# Patient Record
Sex: Male | Born: 1935 | Race: White | Hispanic: No | Marital: Married | Smoking: Never smoker
Health system: Southern US, Community
[De-identification: ages and names within clinical notes are randomized; demographics above are authoritative.]

## PROBLEM LIST (undated history)

## (undated) DIAGNOSIS — IMO0001 Reserved for inherently not codable concepts without codable children: Secondary | ICD-10-CM

## (undated) DIAGNOSIS — N183 Chronic kidney disease, stage 3 unspecified: Secondary | ICD-10-CM

## (undated) DIAGNOSIS — R001 Bradycardia, unspecified: Secondary | ICD-10-CM

## (undated) DIAGNOSIS — K219 Gastro-esophageal reflux disease without esophagitis: Secondary | ICD-10-CM

## (undated) DIAGNOSIS — E785 Hyperlipidemia, unspecified: Secondary | ICD-10-CM

## (undated) DIAGNOSIS — M5090 Cervical disc disorder, unspecified, unspecified cervical region: Secondary | ICD-10-CM

## (undated) DIAGNOSIS — E119 Type 2 diabetes mellitus without complications: Secondary | ICD-10-CM

## (undated) DIAGNOSIS — I480 Paroxysmal atrial fibrillation: Secondary | ICD-10-CM

## (undated) DIAGNOSIS — J849 Interstitial pulmonary disease, unspecified: Secondary | ICD-10-CM

## (undated) DIAGNOSIS — I2510 Atherosclerotic heart disease of native coronary artery without angina pectoris: Secondary | ICD-10-CM

## (undated) DIAGNOSIS — I10 Essential (primary) hypertension: Secondary | ICD-10-CM

## (undated) DIAGNOSIS — J189 Pneumonia, unspecified organism: Secondary | ICD-10-CM

## (undated) DIAGNOSIS — I5032 Chronic diastolic (congestive) heart failure: Secondary | ICD-10-CM

## (undated) HISTORY — DX: Chronic kidney disease, stage 3 (moderate): N18.3

## (undated) HISTORY — PX: POSTERIOR FUSION CERVICAL SPINE: SUR628

## (undated) HISTORY — PX: KNEE ARTHROSCOPY: SHX127

## (undated) HISTORY — DX: Paroxysmal atrial fibrillation: I48.0

## (undated) HISTORY — PX: CATARACT EXTRACTION W/ INTRAOCULAR LENS  IMPLANT, BILATERAL: SHX1307

## (undated) HISTORY — DX: Gastro-esophageal reflux disease without esophagitis: K21.9

## (undated) HISTORY — PX: JOINT REPLACEMENT: SHX530

## (undated) HISTORY — DX: Atherosclerotic heart disease of native coronary artery without angina pectoris: I25.10

## (undated) HISTORY — PX: ROTATOR CUFF REPAIR: SHX139

## (undated) HISTORY — DX: Chronic diastolic (congestive) heart failure: I50.32

## (undated) HISTORY — DX: Cervical disc disorder, unspecified, unspecified cervical region: M50.90

## (undated) HISTORY — DX: Chronic kidney disease, stage 3 unspecified: N18.30

## (undated) HISTORY — PX: KNEE ARTHROPLASTY: SHX992

## (undated) HISTORY — PX: CHOLECYSTECTOMY: SHX55

## (undated) HISTORY — DX: Hyperlipidemia, unspecified: E78.5

## (undated) HISTORY — DX: Bradycardia, unspecified: R00.1

## (undated) HISTORY — PX: CERVICAL DISC SURGERY: SHX588

## (undated) HISTORY — PX: EYE SURGERY: SHX253

---

## 1997-10-11 ENCOUNTER — Other Ambulatory Visit: Admission: RE | Admit: 1997-10-11 | Discharge: 1997-10-11 | Payer: Self-pay | Admitting: Internal Medicine

## 1997-11-22 ENCOUNTER — Other Ambulatory Visit: Admission: RE | Admit: 1997-11-22 | Discharge: 1997-11-22 | Payer: Self-pay | Admitting: Internal Medicine

## 1998-09-28 ENCOUNTER — Ambulatory Visit (HOSPITAL_BASED_OUTPATIENT_CLINIC_OR_DEPARTMENT_OTHER): Admission: RE | Admit: 1998-09-28 | Discharge: 1998-09-28 | Payer: Self-pay | Admitting: *Deleted

## 1999-10-07 ENCOUNTER — Emergency Department (HOSPITAL_COMMUNITY): Admission: EM | Admit: 1999-10-07 | Discharge: 1999-10-07 | Payer: Self-pay | Admitting: Emergency Medicine

## 1999-10-09 ENCOUNTER — Ambulatory Visit (HOSPITAL_COMMUNITY): Admission: RE | Admit: 1999-10-09 | Discharge: 1999-10-09 | Payer: Self-pay | Admitting: Gastroenterology

## 1999-10-09 ENCOUNTER — Encounter: Payer: Self-pay | Admitting: Gastroenterology

## 2000-01-10 ENCOUNTER — Encounter: Admission: RE | Admit: 2000-01-10 | Discharge: 2000-01-15 | Payer: Self-pay | Admitting: *Deleted

## 2000-09-06 ENCOUNTER — Emergency Department (HOSPITAL_COMMUNITY): Admission: EM | Admit: 2000-09-06 | Discharge: 2000-09-06 | Payer: Self-pay | Admitting: Emergency Medicine

## 2001-03-06 ENCOUNTER — Ambulatory Visit (HOSPITAL_COMMUNITY): Admission: RE | Admit: 2001-03-06 | Discharge: 2001-03-06 | Payer: Self-pay | Admitting: Gastroenterology

## 2003-07-28 ENCOUNTER — Emergency Department (HOSPITAL_COMMUNITY): Admission: EM | Admit: 2003-07-28 | Discharge: 2003-07-29 | Payer: Self-pay | Admitting: Emergency Medicine

## 2004-12-24 ENCOUNTER — Encounter: Admission: RE | Admit: 2004-12-24 | Discharge: 2004-12-24 | Payer: Self-pay | Admitting: Gastroenterology

## 2005-05-20 DIAGNOSIS — J189 Pneumonia, unspecified organism: Secondary | ICD-10-CM

## 2005-05-20 HISTORY — DX: Pneumonia, unspecified organism: J18.9

## 2005-06-06 ENCOUNTER — Inpatient Hospital Stay (HOSPITAL_COMMUNITY): Admission: EM | Admit: 2005-06-06 | Discharge: 2005-06-09 | Payer: Self-pay | Admitting: Emergency Medicine

## 2006-02-07 ENCOUNTER — Ambulatory Visit (HOSPITAL_COMMUNITY): Admission: RE | Admit: 2006-02-07 | Discharge: 2006-02-08 | Payer: Self-pay | Admitting: Orthopaedic Surgery

## 2006-03-22 ENCOUNTER — Emergency Department (HOSPITAL_COMMUNITY): Admission: EM | Admit: 2006-03-22 | Discharge: 2006-03-23 | Payer: Self-pay | Admitting: Emergency Medicine

## 2006-04-24 ENCOUNTER — Encounter: Admission: RE | Admit: 2006-04-24 | Discharge: 2006-05-16 | Payer: Self-pay | Admitting: Orthopaedic Surgery

## 2006-06-20 ENCOUNTER — Ambulatory Visit (HOSPITAL_COMMUNITY): Admission: RE | Admit: 2006-06-20 | Discharge: 2006-06-21 | Payer: Self-pay | Admitting: Orthopaedic Surgery

## 2011-07-11 DIAGNOSIS — Z961 Presence of intraocular lens: Secondary | ICD-10-CM | POA: Diagnosis not present

## 2011-07-11 DIAGNOSIS — E11311 Type 2 diabetes mellitus with unspecified diabetic retinopathy with macular edema: Secondary | ICD-10-CM | POA: Diagnosis not present

## 2011-07-11 DIAGNOSIS — E11359 Type 2 diabetes mellitus with proliferative diabetic retinopathy without macular edema: Secondary | ICD-10-CM | POA: Diagnosis not present

## 2012-01-14 DIAGNOSIS — N401 Enlarged prostate with lower urinary tract symptoms: Secondary | ICD-10-CM | POA: Diagnosis not present

## 2012-01-14 DIAGNOSIS — N138 Other obstructive and reflux uropathy: Secondary | ICD-10-CM | POA: Diagnosis not present

## 2012-01-24 DIAGNOSIS — I10 Essential (primary) hypertension: Secondary | ICD-10-CM | POA: Diagnosis not present

## 2012-01-24 DIAGNOSIS — E785 Hyperlipidemia, unspecified: Secondary | ICD-10-CM | POA: Diagnosis not present

## 2012-01-24 DIAGNOSIS — Z0000 Encounter for general adult medical examination without abnormal findings: Secondary | ICD-10-CM | POA: Diagnosis not present

## 2012-01-24 DIAGNOSIS — Z1331 Encounter for screening for depression: Secondary | ICD-10-CM | POA: Diagnosis not present

## 2012-04-22 DIAGNOSIS — Z23 Encounter for immunization: Secondary | ICD-10-CM | POA: Diagnosis not present

## 2012-05-14 DIAGNOSIS — E11359 Type 2 diabetes mellitus with proliferative diabetic retinopathy without macular edema: Secondary | ICD-10-CM | POA: Diagnosis not present

## 2012-05-14 DIAGNOSIS — E11311 Type 2 diabetes mellitus with unspecified diabetic retinopathy with macular edema: Secondary | ICD-10-CM | POA: Diagnosis not present

## 2012-05-14 DIAGNOSIS — Z961 Presence of intraocular lens: Secondary | ICD-10-CM | POA: Diagnosis not present

## 2013-01-14 DIAGNOSIS — N139 Obstructive and reflux uropathy, unspecified: Secondary | ICD-10-CM | POA: Diagnosis not present

## 2013-01-14 DIAGNOSIS — N401 Enlarged prostate with lower urinary tract symptoms: Secondary | ICD-10-CM | POA: Diagnosis not present

## 2013-02-05 DIAGNOSIS — N179 Acute kidney failure, unspecified: Secondary | ICD-10-CM | POA: Diagnosis not present

## 2013-02-05 DIAGNOSIS — N183 Chronic kidney disease, stage 3 unspecified: Secondary | ICD-10-CM | POA: Diagnosis not present

## 2013-02-05 DIAGNOSIS — Z1331 Encounter for screening for depression: Secondary | ICD-10-CM | POA: Diagnosis not present

## 2013-02-05 DIAGNOSIS — I10 Essential (primary) hypertension: Secondary | ICD-10-CM | POA: Diagnosis not present

## 2013-02-05 DIAGNOSIS — Z0000 Encounter for general adult medical examination without abnormal findings: Secondary | ICD-10-CM | POA: Diagnosis not present

## 2013-02-05 DIAGNOSIS — E785 Hyperlipidemia, unspecified: Secondary | ICD-10-CM | POA: Diagnosis not present

## 2013-02-05 DIAGNOSIS — E119 Type 2 diabetes mellitus without complications: Secondary | ICD-10-CM | POA: Diagnosis not present

## 2013-03-05 DIAGNOSIS — Z23 Encounter for immunization: Secondary | ICD-10-CM | POA: Diagnosis not present

## 2013-05-11 ENCOUNTER — Encounter (HOSPITAL_COMMUNITY): Payer: Self-pay | Admitting: Emergency Medicine

## 2013-05-11 ENCOUNTER — Inpatient Hospital Stay (HOSPITAL_COMMUNITY)
Admission: EM | Admit: 2013-05-11 | Discharge: 2013-05-17 | DRG: 194 | Disposition: A | Discharge status: Home / Self Care | Payer: BC Managed Care – PPO | Attending: Internal Medicine | Admitting: Internal Medicine

## 2013-05-11 ENCOUNTER — Emergency Department (HOSPITAL_COMMUNITY): Payer: BC Managed Care – PPO

## 2013-05-11 DIAGNOSIS — I10 Essential (primary) hypertension: Secondary | ICD-10-CM | POA: Diagnosis not present

## 2013-05-11 DIAGNOSIS — J9801 Acute bronchospasm: Secondary | ICD-10-CM | POA: Diagnosis not present

## 2013-05-11 DIAGNOSIS — R0602 Shortness of breath: Secondary | ICD-10-CM | POA: Diagnosis not present

## 2013-05-11 DIAGNOSIS — Z79899 Other long term (current) drug therapy: Secondary | ICD-10-CM

## 2013-05-11 DIAGNOSIS — J111 Influenza due to unidentified influenza virus with other respiratory manifestations: Secondary | ICD-10-CM | POA: Diagnosis not present

## 2013-05-11 DIAGNOSIS — Z96619 Presence of unspecified artificial shoulder joint: Secondary | ICD-10-CM

## 2013-05-11 DIAGNOSIS — E119 Type 2 diabetes mellitus without complications: Secondary | ICD-10-CM | POA: Diagnosis not present

## 2013-05-11 DIAGNOSIS — J09X2 Influenza due to identified novel influenza A virus with other respiratory manifestations: Principal | ICD-10-CM | POA: Diagnosis present

## 2013-05-11 DIAGNOSIS — B9789 Other viral agents as the cause of diseases classified elsewhere: Secondary | ICD-10-CM | POA: Diagnosis not present

## 2013-05-11 DIAGNOSIS — Z7982 Long term (current) use of aspirin: Secondary | ICD-10-CM | POA: Diagnosis not present

## 2013-05-11 DIAGNOSIS — Z96659 Presence of unspecified artificial knee joint: Secondary | ICD-10-CM | POA: Diagnosis not present

## 2013-05-11 DIAGNOSIS — J9819 Other pulmonary collapse: Secondary | ICD-10-CM | POA: Diagnosis not present

## 2013-05-11 DIAGNOSIS — R197 Diarrhea, unspecified: Secondary | ICD-10-CM | POA: Diagnosis present

## 2013-05-11 DIAGNOSIS — E860 Dehydration: Secondary | ICD-10-CM | POA: Diagnosis not present

## 2013-05-11 DIAGNOSIS — R6889 Other general symptoms and signs: Secondary | ICD-10-CM

## 2013-05-11 DIAGNOSIS — N179 Acute kidney failure, unspecified: Secondary | ICD-10-CM | POA: Diagnosis not present

## 2013-05-11 HISTORY — DX: Essential (primary) hypertension: I10

## 2013-05-11 HISTORY — DX: Type 2 diabetes mellitus without complications: E11.9

## 2013-05-11 HISTORY — DX: Pneumonia, unspecified organism: J18.9

## 2013-05-11 LAB — CBC WITH DIFFERENTIAL/PLATELET
Basophils Absolute: 0 10*3/uL (ref 0.0–0.1)
Basophils Relative: 0 % (ref 0–1)
HCT: 39.9 % (ref 39.0–52.0)
Hemoglobin: 13.1 g/dL (ref 13.0–17.0)
Lymphocytes Relative: 7 % — ABNORMAL LOW (ref 12–46)
MCH: 31.8 pg (ref 26.0–34.0)
MCHC: 32.8 g/dL (ref 30.0–36.0)
Monocytes Relative: 19 % — ABNORMAL HIGH (ref 3–12)
Neutrophils Relative %: 74 % (ref 43–77)
Platelets: 198 10*3/uL (ref 150–400)
RBC: 4.12 MIL/uL — ABNORMAL LOW (ref 4.22–5.81)
WBC: 9.2 10*3/uL (ref 4.0–10.5)

## 2013-05-11 LAB — COMPREHENSIVE METABOLIC PANEL
ALT: 31 U/L (ref 0–53)
Glucose, Bld: 177 mg/dL — ABNORMAL HIGH (ref 70–99)
Total Bilirubin: 0.2 mg/dL — ABNORMAL LOW (ref 0.3–1.2)
Total Protein: 6.4 g/dL (ref 6.0–8.3)

## 2013-05-11 LAB — CBC
MCH: 31.9 pg (ref 26.0–34.0)
MCHC: 32.6 g/dL (ref 30.0–36.0)
Platelets: 185 10*3/uL (ref 150–400)
RBC: 3.95 MIL/uL — ABNORMAL LOW (ref 4.22–5.81)
RDW: 13.9 % (ref 11.5–15.5)

## 2013-05-11 LAB — URINALYSIS, ROUTINE W REFLEX MICROSCOPIC
Glucose, UA: NEGATIVE mg/dL
Leukocytes, UA: NEGATIVE
Nitrite: NEGATIVE
Specific Gravity, Urine: 1.018 (ref 1.005–1.030)
Urobilinogen, UA: 0.2 mg/dL (ref 0.0–1.0)

## 2013-05-11 LAB — GLUCOSE, CAPILLARY
Glucose-Capillary: 86 mg/dL (ref 70–99)
Glucose-Capillary: 95 mg/dL (ref 70–99)
Glucose-Capillary: 140 mg/dL — ABNORMAL HIGH (ref 70–99)

## 2013-05-11 LAB — POCT I-STAT TROPONIN I: Troponin i, poc: 0 ng/mL (ref 0.00–0.08)

## 2013-05-11 LAB — CREATININE, SERUM
Creatinine, Ser: 2.21 mg/dL — ABNORMAL HIGH (ref 0.50–1.35)
GFR calc non Af Amer: 27 mL/min — ABNORMAL LOW (ref >90)

## 2013-05-11 LAB — INFLUENZA PANEL BY PCR (TYPE A & B)
H1N1 flu by pcr: NOT DETECTED
Influenza A By PCR: POSITIVE — AB

## 2013-05-11 LAB — CG4 I-STAT (LACTIC ACID): Lactic Acid, Venous: 2.39 mmol/L — ABNORMAL HIGH (ref 0.5–2.2)

## 2013-05-11 MED ORDER — ONDANSETRON HCL 4 MG PO TABS: 4.0000 mg | ORAL_TABLET | Freq: Four times a day (QID) | ORAL | Status: DC | PRN

## 2013-05-11 MED ORDER — PHENOL 1.4 % MT LIQD
1.0000 | OROMUCOSAL | Status: DC | PRN
  Administered 2013-05-11: 1 via OROMUCOSAL
  Filled 2013-05-11 (×2): qty 177

## 2013-05-11 MED ORDER — OSELTAMIVIR PHOSPHATE 75 MG PO CAPS
75.0000 mg | ORAL_CAPSULE | Freq: Two times a day (BID) | ORAL | Status: DC
  Administered 2013-05-11 – 2013-05-12 (×3): 75 mg via ORAL
  Filled 2013-05-11 (×4): qty 1

## 2013-05-11 MED ORDER — MENTHOL 3 MG MT LOZG
1.0000 | LOZENGE | OROMUCOSAL | Status: DC | PRN
  Administered 2013-05-11 – 2013-05-12 (×3): 3 mg via ORAL
  Filled 2013-05-11 (×2): qty 9

## 2013-05-11 MED ORDER — SODIUM CHLORIDE 0.9 % IV BOLUS (SEPSIS)
1000.0000 mL | Freq: Once | INTRAVENOUS | Status: AC
  Administered 2013-05-11: 1000 mL via INTRAVENOUS

## 2013-05-11 MED ORDER — ONDANSETRON HCL 4 MG/2ML IJ SOLN: 4.0000 mg | Freq: Four times a day (QID) | INTRAMUSCULAR | Status: DC | PRN

## 2013-05-11 MED ORDER — ACETAMINOPHEN 325 MG PO TABS
650.0000 mg | ORAL_TABLET | Freq: Four times a day (QID) | ORAL | Status: DC | PRN
  Administered 2013-05-13: 650 mg via ORAL
  Filled 2013-05-11: qty 2

## 2013-05-11 MED ORDER — ENOXAPARIN SODIUM 30 MG/0.3ML ~~LOC~~ SOLN
30.0000 mg | SUBCUTANEOUS | Status: DC
  Administered 2013-05-11: 30 mg via SUBCUTANEOUS
  Filled 2013-05-11 (×2): qty 0.3

## 2013-05-11 MED ORDER — ACETAMINOPHEN 650 MG RE SUPP: 650.0000 mg | Freq: Four times a day (QID) | RECTAL | Status: DC | PRN

## 2013-05-11 MED ORDER — SODIUM CHLORIDE 0.9 % IV SOLN: INTRAVENOUS | Status: DC

## 2013-05-11 MED ORDER — INSULIN ASPART 100 UNIT/ML ~~LOC~~ SOLN
0.0000 [IU] | Freq: Three times a day (TID) | SUBCUTANEOUS | Status: DC
  Administered 2013-05-12: 1 [IU] via SUBCUTANEOUS
  Administered 2013-05-12: 2 [IU] via SUBCUTANEOUS
  Administered 2013-05-13: 1 [IU] via SUBCUTANEOUS
  Administered 2013-05-13: 3 [IU] via SUBCUTANEOUS
  Administered 2013-05-14: 2 [IU] via SUBCUTANEOUS
  Administered 2013-05-15: 3 [IU] via SUBCUTANEOUS
  Administered 2013-05-16: 1 [IU] via SUBCUTANEOUS
  Administered 2013-05-16: 3 [IU] via SUBCUTANEOUS
  Administered 2013-05-16: 1 [IU] via SUBCUTANEOUS

## 2013-05-11 MED ORDER — INSULIN ASPART 100 UNIT/ML ~~LOC~~ SOLN
0.0000 [IU] | Freq: Every day | SUBCUTANEOUS | Status: DC
  Administered 2013-05-15 – 2013-05-16 (×2): 3 [IU] via SUBCUTANEOUS

## 2013-05-11 MED ORDER — SODIUM CHLORIDE 0.9 % IV SOLN
INTRAVENOUS | Status: DC
  Administered 2013-05-11 – 2013-05-13 (×4): via INTRAVENOUS

## 2013-05-11 NOTE — Progress Notes (Signed)
Got report from [employer].

## 2013-05-11 NOTE — H&P (Signed)
[facility] History and Physical  Justin Leach WJX:[id number] DOB: 1932/02/06 DOA: 05/11/2013  Referring physician: Dr. Pricilla Loveless PCP: Lillia Mountain, MD   Chief Complaint: Flu like symptoms.   History of Present Illness: Justin Leach is an 79 y.o. male with a PMH of DM and HTN who presents with a 4 day history of sore throat, fever, myalgias, dyspnea, cough productive of yellow mucous, but no chest pain.  Had flu vaccine this year.  Reports wife was recently sick with similar symptoms.  Saw PCP earlier today, and was sent to the ER for further evaluation.  Reports 2 months of diarrhea, no melena or hematochezia, not on any recent antibiotic therapy.  No aggravating or alleviating factors.  No significant PO intake for the past few days. Upon initial evaluation in the ER, patient was found to have an elevated creatinine. He was referred for further inpatient evaluation and treatment.  Review of Systems: Constitutional: + fever, no chills;  Appetite diminished; No weight loss, no weight gain, no fatigue.  HEENT: No blurry vision, no diplopia, + pharyngitis, no dysphagia CV: No chest pain, no palpitations, no PND.  Resp: + SOB, + cough, no pleuritic pain. GI: No nausea, no vomiting, but does report dry heaves, + diarrhea x 2 months, no melena, no hematochezia, no constipation.  GU: No dysuria, no hematuria, no frequency, no urgency. MSK: + myalgias, no arthralgias.  Neuro:  No headache, no focal neurological deficits, no history of seizures.  Psych: No depression, no anxiety.  Endo: No heat intolerance, no cold intolerance, no polyuria, no polydipsia  Skin: No rashes, no skin lesions.  Heme: No easy bruising.  Travel history: None in past 6 months.  Past Medical History Past Medical History  Diagnosis Date  . Diabetes mellitus without complication   . Hypertension      Past Surgical History Past Surgical History  Procedure Laterality Date  . Cervical surgeries    .  Cholecystectomy    . Joint replacement      bilateral knees, elbows, and shoulders  . Eye surgery       Social History: History   Social History  . Marital Status: Married    Spouse Name: Bonita Quin    Number of Children: 0  . Years of Education: N/A   Occupational History  . Retired from [employer] work.    Social History Main Topics  . Smoking status: Never Smoker   . Smokeless tobacco: Not on file  . Alcohol Use: No  . Drug Use: No  . Sexual Activity: Not on file   Other Topics Concern  . Not on file   Social History Narrative   Married.  Lives with wife.  Ambulates independently.    Family History:  Family History  Problem Relation Age of Onset  . Heart disease Neg Hx   . Cancer Neg Hx   . Diabetes Neg Hx     Allergies: Nifedipine  Meds: Prior to Admission medications   Not on File    Physical Exam: Filed Vitals:   05/11/13 1125 05/11/13 1148 05/11/13 1200 05/11/13 1215  BP:  109/37 105/44 118/60  Pulse:  61 58 60  Temp: 98.5 F (36.9 C) 98.6 F (37 C)    TempSrc: Oral Oral    Resp:  19 18 20   Weight:      SpO2:  98% 98% 96%     Physical Exam: Blood pressure 118/60, pulse 60, temperature 98.6 F (37 C), temperature source  Oral, resp. rate 20, weight 84.369 kg (186 lb), SpO2 96.00%. Gen: No acute distress. Head: Normocephalic, atraumatic. Eyes: PERRL, EOMI, sclerae nonicteric. Mouth: Oropharynx clear with postnasal drainage. Neck: Supple, no thyromegaly, no lymphadenopathy, no jugular venous distention. Chest: Lungs diminished, no wheezes, faint rhonchi. CV: Heart sounds are regular. No murmurs, rubs, or gallops. Abdomen: Soft, nontender, nondistended with normal active bowel sounds. Extremities: Extremities are without clubbing, edema, or cyanosis. Skin: Warm and dry. Neuro: Alert and oriented times 3; cranial nerves II through XII grossly intact. Psych: Mood and affect normal.  Labs on Admission:  Basic Metabolic Panel:  Recent  Labs Lab 05/11/13 0937  NA 139  K 4.1  CL 96  CO2 29  GLUCOSE 177*  BUN 42*  CREATININE 2.32*  CALCIUM 9.6   Liver Function Tests:  Recent Labs Lab 05/11/13 0937  AST 43*  ALT 31  ALKPHOS 69  BILITOT 0.2*  PROT 6.4  ALBUMIN 3.3*   CBC:  Recent Labs Lab 05/11/13 0937  WBC 9.2  NEUTROABS 6.7  HGB 13.1  HCT 39.9  MCV 96.8  PLT 198    CBG:  Recent Labs Lab 05/11/13 1157  GLUCAP 119*    Radiological Exams on Admission: Dg Chest 2 View  05/11/2013   CLINICAL DATA:  Shortness of breath, weakness  EXAM: CHEST  2 VIEW  COMPARISON:  None.  FINDINGS: Cardiomediastinal silhouette is stable. No acute infiltrate or pleural effusion. No pulmonary edema. Probable bilateral nodular nipple shadow. Repeat frontal view with nipple markers is recommended for confirmation. Osteopenia and mild degenerative changes thoracic spine. Stable mild compression deformity upper lumbar spine.  IMPRESSION: No acute infiltrate or pleural effusion. No pulmonary edema. Probable bilateral nodular nipple shadow. Repeat frontal view with nipple markers is recommended for confirmation. Osteopenia and mild degenerative changes thoracic spine.   Electronically Signed   By: Natasha Mead M.D.   On: 05/11/2013 10:20    EKG: Independently reviewed. Normal sinus rhythm at 68 beats per minute. LVH.  Assessment/Plan Principal Problem:   Acute renal failure secondary to dehydration / diarrhea Likely from a viral illness. Check GI pathogen panel and hydrate with normal saline 100 cc per hour. Recheck creatinine in the morning. Active Problems:   Diabetes We'll place on sliding scale insulin before every meal/at bedtime.   Hypertension Hold antihypertensives for now.   Influenza-like illness Check influenza panel and start empiric Tamiflu.  Code Status: Full. Family Communication: No family at bedside. Disposition Plan: Home when stable.  Time spent: 1 hour.  Aydia Maj [facility] Pager  [phone]  If 7PM-7AM, please contact night-coverage [email or web address] Password [facility] 05/11/2013, 12:41 PM

## 2013-05-11 NOTE — ED Provider Notes (Signed)
CSN: [id number]     Arrival date & time 05/11/13  0865 History   First MD Initiated Contact with Patient 05/11/13 0932     Chief Complaint  Patient presents with  . Diarrhea  . Shortness of Breath  . Emesis   (Consider location/radiation/quality/duration/timing/severity/associated sxs/prior Treatment) HPI Comments: 79 year old male sent in the ER by his primary care physician for fluids and lab work. He states that over the past several days he's been having trouble swallowing due to sore throat. Also been having a cough and shortness of breath with exertion. His been having diarrhea for 3-4 weeks. Denies any blood in his stools. He does also been having dry heaves and nausea. Denies any current nausea. No abdominal pain or chest pain. He states he lives by himself is concerned that if he would get sick he wouldn't be able to get help soon enough he kept getting worse. His wife, who lives in a separate house, has also been ill recently and fears that she transferred to him.   Past Medical History  Diagnosis Date  . Diabetes mellitus without complication   . Hypertension    Past Surgical History  Procedure Laterality Date  . Cervical surgeries    . Cholecystectomy    . Joint replacement      bilateral knees, elbows, and shoulders  . Eye surgery     No family history on file. History  Substance Use Topics  . Smoking status: Never Smoker   . Smokeless tobacco: Not on file  . Alcohol Use: No    Review of Systems  Constitutional: Positive for fever.  Respiratory: Positive for shortness of breath.   Gastrointestinal: Positive for nausea, vomiting and diarrhea. Negative for abdominal pain.  All other systems reviewed and are negative.    Allergies  Nifedipine  Home Medications  No current outpatient prescriptions on file. BP 119/44  Pulse 67  Temp(Src) 98.9 F (37.2 C) (Oral)  Resp 22  Wt 186 lb (84.369 kg)  SpO2 93% Physical Exam  Nursing note and vitals  reviewed. Constitutional: He is oriented to person, place, and time. He appears well-developed and well-nourished.  HENT:  Head: Normocephalic and atraumatic.  Right Ear: External ear normal.  Left Ear: External ear normal.  Nose: Nose normal.  Mouth/Throat: No oropharyngeal exudate.  Dry mucous membranes  Eyes: Right eye exhibits no discharge. Left eye exhibits no discharge.  Neck: Neck supple.  Cardiovascular: Normal rate, regular rhythm, normal heart sounds and intact distal pulses.   Pulmonary/Chest: Effort normal and breath sounds normal. He has no wheezes.  Abdominal: Soft. He exhibits no distension. There is no tenderness.  Musculoskeletal: He exhibits no edema.  Neurological: He is alert and oriented to person, place, and time.  Skin: Skin is warm and dry.    ED Course  Procedures (including critical care time) Labs Review Labs Reviewed  CBC WITH DIFFERENTIAL - Abnormal; Notable for the following:    RBC 4.12 (*)    Lymphocytes Relative 7 (*)    Monocytes Relative 19 (*)    Monocytes Absolute 1.7 (*)    All other components within normal limits  COMPREHENSIVE METABOLIC PANEL - Abnormal; Notable for the following:    Glucose, Bld 177 (*)    BUN 42 (*)    Creatinine, Ser 2.32 (*)    Albumin 3.3 (*)    AST 43 (*)    Total Bilirubin 0.2 (*)    GFR calc non Af Amer 25 (*)  GFR calc Af Amer 29 (*)    All other components within normal limits  GLUCOSE, CAPILLARY - Abnormal; Notable for the following:    Glucose-Capillary 119 (*)    All other components within normal limits  CG4 I-STAT (LACTIC ACID) - Abnormal; Notable for the following:    Lactic Acid, Venous 2.39 (*)    All other components within normal limits  URINALYSIS, ROUTINE W REFLEX MICROSCOPIC  INFLUENZA PANEL BY PCR  POCT I-STAT TROPONIN I   Imaging Review Dg Chest 2 View  05/11/2013   CLINICAL DATA:  Shortness of breath, weakness  EXAM: CHEST  2 VIEW  COMPARISON:  None.  FINDINGS: Cardiomediastinal  silhouette is stable. No acute infiltrate or pleural effusion. No pulmonary edema. Probable bilateral nodular nipple shadow. Repeat frontal view with nipple markers is recommended for confirmation. Osteopenia and mild degenerative changes thoracic spine. Stable mild compression deformity upper lumbar spine.  IMPRESSION: No acute infiltrate or pleural effusion. No pulmonary edema. Probable bilateral nodular nipple shadow. Repeat frontal view with nipple markers is recommended for confirmation. Osteopenia and mild degenerative changes thoracic spine.   Electronically Signed   By: Natasha Mead M.D.   On: 05/11/2013 10:20    EKG Interpretation   None       MDM   1. Acute renal failure   2. Flu-like symptoms    Patient has no pneumonia. Has acute renal insufficiency. There no baseline labs but per the patient's knowledge is no prior history of any renal problems. We'll treat with fluids and symptomatic care. We'll test for influenza.    Audree Camel, MD 05/11/13 [phone]

## 2013-05-11 NOTE — Progress Notes (Signed)
Admission note:   Arrival Method: Via stretcher from ED. Mental Status: A&Ox4 Telemetry: N/A  Skin: Intact.  Tubes: N/A IV: LAC NS@100ml /hr Pain: Denies.  Family: Pt is alone.  Living Situation: Home with spouse. Safety Measures: Call bell within reach. Bed alarm on.  6E Orientation: Oriented to unit and surroundings.   Kathlene November, [place] Smouse [place]

## 2013-05-11 NOTE — ED Notes (Signed)
Critical lactic acid lab value reported to dr. Criss Alvine.

## 2013-05-11 NOTE — Progress Notes (Signed)
Second attempt to get report.

## 2013-05-11 NOTE — ED Notes (Signed)
PT TO FLOOR

## 2013-05-11 NOTE — ED Notes (Signed)
Pt is diabetic and sent here for vomiting and diarrhea since Friday.  Pt reports short of breath with exertion and laying back.

## 2013-05-11 NOTE — ED Notes (Signed)
Checked patient cbg it was 56 notifed RN Brett Canales of blood sugar

## 2013-05-11 NOTE — ED Notes (Signed)
PT reports sore throat and temp 101 last nite

## 2013-05-11 NOTE — Progress Notes (Signed)
Attempted to get report from [place].

## 2013-05-12 DIAGNOSIS — J111 Influenza due to unidentified influenza virus with other respiratory manifestations: Secondary | ICD-10-CM | POA: Diagnosis present

## 2013-05-12 LAB — BASIC METABOLIC PANEL
BUN: 35 mg/dL — ABNORMAL HIGH (ref 6–23)
CO2: 29 mEq/L (ref 19–32)
Chloride: 102 mEq/L (ref 96–112)
Creatinine, Ser: 1.75 mg/dL — ABNORMAL HIGH (ref 0.50–1.35)
GFR calc Af Amer: 41 mL/min — ABNORMAL LOW (ref >90)
GFR calc non Af Amer: 35 mL/min — ABNORMAL LOW (ref >90)
Glucose, Bld: 106 mg/dL — ABNORMAL HIGH (ref 70–99)

## 2013-05-12 LAB — GLUCOSE, CAPILLARY: Glucose-Capillary: 134 mg/dL — ABNORMAL HIGH (ref 70–99)

## 2013-05-12 LAB — CLOSTRIDIUM DIFFICILE BY PCR: Toxigenic C. Difficile by PCR: NEGATIVE

## 2013-05-12 MED ORDER — ENOXAPARIN SODIUM 40 MG/0.4ML ~~LOC~~ SOLN
40.0000 mg | SUBCUTANEOUS | Status: DC
  Administered 2013-05-12 – 2013-05-16 (×5): 40 mg via SUBCUTANEOUS
  Filled 2013-05-12 (×6): qty 0.4

## 2013-05-12 MED ORDER — PANTOPRAZOLE SODIUM 40 MG PO TBEC
40.0000 mg | DELAYED_RELEASE_TABLET | Freq: Every day | ORAL | Status: DC
  Administered 2013-05-12 – 2013-05-17 (×6): 40 mg via ORAL
  Filled 2013-05-12 (×6): qty 1

## 2013-05-12 MED ORDER — OXYMETAZOLINE HCL 0.05 % NA SOLN
2.0000 | Freq: Two times a day (BID) | NASAL | Status: DC | PRN
  Filled 2013-05-12: qty 15

## 2013-05-12 MED ORDER — OSELTAMIVIR PHOSPHATE 30 MG PO CAPS
30.0000 mg | ORAL_CAPSULE | Freq: Two times a day (BID) | ORAL | Status: DC
  Administered 2013-05-12 – 2013-05-14 (×4): 30 mg via ORAL
  Filled 2013-05-12 (×5): qty 1

## 2013-05-12 MED ORDER — METOPROLOL TARTRATE 25 MG PO TABS
25.0000 mg | ORAL_TABLET | Freq: Two times a day (BID) | ORAL | Status: DC
  Administered 2013-05-12 – 2013-05-17 (×11): 25 mg via ORAL
  Filled 2013-05-12 (×12): qty 1

## 2013-05-12 MED ORDER — GUAIFENESIN-DM 100-10 MG/5ML PO SYRP
5.0000 mL | ORAL_SOLUTION | ORAL | Status: DC | PRN
  Administered 2013-05-13 – 2013-05-17 (×7): 5 mL via ORAL
  Filled 2013-05-12 (×8): qty 5

## 2013-05-12 MED ORDER — MENTHOL 3 MG MT LOZG: 1.0000 | LOZENGE | OROMUCOSAL | Status: DC | PRN

## 2013-05-12 NOTE — Progress Notes (Signed)
Subjective: Feels about the same, no diarrhea  Objective: Vital signs in last 24 hours: Temp:  [97.8 F (36.6 C)-99.2 F (37.3 C)] 99.2 F (37.3 C) (12/24 0437) Pulse Rate:  [58-67] 66 (12/24 0437) Resp:  [17-22] 17 (12/24 0437) BP: (105-149)/(37-70) 125/70 mmHg (12/24 0437) SpO2:  [93 %-98 %] 96 % (12/24 0437) Weight:  [84.369 kg (186 lb)] 84.369 kg (186 lb) (12/23 0926) Weight change:  Last BM Date: 05/11/13  Intake/Output from previous day: 12/23 0701 - 12/24 0700 In: 980 [P.O.:480; I.V.:500] Out: 250 [Urine:250] Intake/Output this shift:    General appearance: alert and cooperative Resp: clear to auscultation bilaterally Cardio: regular rate and rhythm, S1, S2 normal, no murmur, click, rub or gallop GI: soft, non-tender; bowel sounds normal; no masses,  no organomegaly Extremities: extremities normal, atraumatic, no cyanosis or edema  Lab Results:  Recent Labs  05/11/13 0937 05/11/13 1517  WBC 9.2 8.7  HGB 13.1 12.6*  HCT 39.9 38.7*  PLT 198 185   BMET  Recent Labs  05/11/13 0937 05/12/13 0412  NA 139 139  K 4.1 4.3  CL 96 102  CO2 29 29  GLUCOSE 177* 106*  BUN 42* 35*  CREATININE 2.32*  2.21* 1.75*  CALCIUM 9.6 8.3*    Studies/Results: Dg Chest 2 View  05/11/2013   CLINICAL DATA:  Shortness of breath, weakness  EXAM: CHEST  2 VIEW  COMPARISON:  None.  FINDINGS: Cardiomediastinal silhouette is stable. No acute infiltrate or pleural effusion. No pulmonary edema. Probable bilateral nodular nipple shadow. Repeat frontal view with nipple markers is recommended for confirmation. Osteopenia and mild degenerative changes thoracic spine. Stable mild compression deformity upper lumbar spine.  IMPRESSION: No acute infiltrate or pleural effusion. No pulmonary edema. Probable bilateral nodular nipple shadow. Repeat frontal view with nipple markers is recommended for confirmation. Osteopenia and mild degenerative changes thoracic spine.   Electronically Signed    By: Natasha Mead M.D.   On: 05/11/2013 10:20    Medications: I have reviewed the patient's current medications.  Assessment/Plan: Principal Problem:   Influenza A, on tamiflu (increase risk complication given age and diabetes), supportive care Active Problems:   Acute renal failure improving, continue IVFs, will check on baseline creatinine   Diarrhea apparently going on for weeks, check C diff, stool cultures, he has not had diarrhea since being in hospital, may need further outpt workup after discharge   Diabetes SSI levemir on hold   Hypertension ok lasix and ACEI on hold   LOS: 1 day   Justin Leach JOSEPH 05/12/2013, 7:24 AM

## 2013-05-12 NOTE — Evaluation (Signed)
Physical Therapy Evaluation Patient Details Name: Justin Leach MRN: [id number] DOB: 24-Aug-1931 Today's Date: 05/12/2013 Time: 4098-1191 PT Time Calculation (min): 14 min  PT Assessment / Plan / Recommendation History of Present Illness  Justin Leach is an 79 y.o. male with a PMH of DM and HTN who presents with a 4 day history of sore throat, fever, myalgias, dyspnea, cough productive of yellow mucous, but no chest pain.  Patient with influenze, dehydration, acute renal failure.  Clinical Impression  Patient presents with problems listed below.  Will benefit from acute PT to maximize independence prior to discharge home with wife.  Encouraged ambulation in hallway with nursing.    PT Assessment  Patient needs continued PT services    Follow Up Recommendations  No PT follow up;Supervision/Assistance - 24 hour    Does the patient have the potential to tolerate intense rehabilitation      Barriers to Discharge Decreased caregiver support Lives alone.  Wife to stay with patient at discharge.    Equipment Recommendations  None recommended by PT    Recommendations for Other Services     Frequency Min 3X/week    Precautions / Restrictions Precautions Precautions: None Restrictions Weight Bearing Restrictions: No   Pertinent Vitals/Pain       Mobility  Bed Mobility Bed Mobility: Supine to Sit;Sitting - Scoot to Edge of Bed Supine to Sit: 6: Modified independent (Device/Increase time);With rails Sitting - Scoot to Edge of Bed: 5: Supervision Details for Bed Mobility Assistance: No cues or assist needed.  Supervision for safety only Transfers Transfers: Sit to Stand;Stand to Sit Sit to Stand: 5: Supervision;With upper extremity assist;From bed Stand to Sit: 5: Supervision;With upper extremity assist;To bed Details for Transfer Assistance: Verbal cues for hand placement.  Supervision for safety/balance only.  Patient stood and took several steps in place.  Returned to  bed due to fatigue and dyspnea 3/4.   Ambulation/Gait Ambulation/Gait Assistance: Not tested (comment) (Patient declined ambulation.)    Exercises     PT Diagnosis: Difficulty walking;Generalized weakness  PT Problem List: Decreased strength;Decreased activity tolerance;Decreased mobility;Cardiopulmonary status limiting activity PT Treatment Interventions: Gait training;Stair training;Functional mobility training;Patient/family education     PT Goals(Current goals can be found in the care plan section) Acute Rehab PT Goals Patient Stated Goal: To feel better PT Goal Formulation: With patient Time For Goal Achievement: 05/19/13 Potential to Achieve Goals: Good  Visit Information  Last PT Received On: 05/12/13 Assistance Needed: +1 History of Present Illness: Justin Leach is an 79 y.o. male with a PMH of DM and HTN who presents with a 4 day history of sore throat, fever, myalgias, dyspnea, cough productive of yellow mucous, but no chest pain.  Patient with influenze, dehydration, acute renal failure.       Prior Functioning  Home Living Family/patient expects to be discharged to:: Private residence Living Arrangements: Alone (He and wife live in separate houses) Available Help at Discharge: Family;Available 24 hours/day (wife will stay with patient at discharge) Type of Home: House Home Access: Stairs to enter [employer] of Steps: 4 Entrance Stairs-Rails: Right;Left Home Layout: One level Home Equipment: None Prior Function Level of Independence: Independent Communication Communication: No difficulties    Cognition  Cognition Arousal/Alertness: Awake/alert Behavior During Therapy: WFL for tasks assessed/performed Overall Cognitive Status: Within Functional Limits for tasks assessed    Extremity/Trunk Assessment Upper Extremity Assessment Upper Extremity Assessment: Overall WFL for tasks assessed Lower Extremity Assessment Lower Extremity Assessment:  Overall WFL for  tasks assessed   Balance Balance Balance Assessed: Yes Static Sitting Balance Static Sitting - Balance Support: No upper extremity supported;Feet supported Static Sitting - Level of Assistance: 7: Independent Static Sitting - Comment/# of Minutes: 4 Static Standing Balance Static Standing - Balance Support: No upper extremity supported Static Standing - Level of Assistance: 5: Stand by assistance Static Standing - Comment/# of Minutes: 2  End of Session PT - End of Session Equipment Utilized During Treatment: Gait belt Activity Tolerance: Patient limited by fatigue Patient left: in bed;with call bell/phone within reach (sitting EOB to eat lunch) Nurse Communication: Mobility status  GP     Vena Austria 05/12/2013, 1:37 PM Durenda Hurt. Renaldo Fiddler, [facility] Acute Rehab Services Pager [phone]

## 2013-05-13 DIAGNOSIS — J111 Influenza due to unidentified influenza virus with other respiratory manifestations: Secondary | ICD-10-CM

## 2013-05-13 DIAGNOSIS — E860 Dehydration: Secondary | ICD-10-CM

## 2013-05-13 DIAGNOSIS — N179 Acute kidney failure, unspecified: Secondary | ICD-10-CM

## 2013-05-13 DIAGNOSIS — E119 Type 2 diabetes mellitus without complications: Secondary | ICD-10-CM

## 2013-05-13 LAB — BASIC METABOLIC PANEL
BUN: 29 mg/dL — ABNORMAL HIGH (ref 6–23)
CO2: 26 mEq/L (ref 19–32)
Glucose, Bld: 131 mg/dL — ABNORMAL HIGH (ref 70–99)
Potassium: 4.5 mEq/L (ref 3.5–5.1)
Sodium: 137 mEq/L (ref 135–145)

## 2013-05-13 LAB — GLUCOSE, CAPILLARY
Glucose-Capillary: 128 mg/dL — ABNORMAL HIGH (ref 70–99)
Glucose-Capillary: 206 mg/dL — ABNORMAL HIGH (ref 70–99)
Glucose-Capillary: 157 mg/dL — ABNORMAL HIGH (ref 70–99)

## 2013-05-13 MED ORDER — CLONIDINE HCL 0.1 MG PO TABS: 0.1000 mg | ORAL_TABLET | ORAL | Status: DC | PRN

## 2013-05-13 MED ORDER — ALBUTEROL SULFATE (5 MG/ML) 0.5% IN NEBU
2.5000 mg | INHALATION_SOLUTION | Freq: Four times a day (QID) | RESPIRATORY_TRACT | Status: DC
  Administered 2013-05-13 – 2013-05-17 (×16): 2.5 mg via RESPIRATORY_TRACT
  Filled 2013-05-13 (×18): qty 0.5

## 2013-05-13 NOTE — Progress Notes (Signed)
Subjective: [name] was admitted with respiratory infection - influenza. He c/o congestion and shortness of breath.  Objective: Lab:  Recent Labs  05/11/13 0937 05/11/13 1517  WBC 9.2 8.7  NEUTROABS 6.7  --   HGB 13.1 12.6*  HCT 39.9 38.7*  MCV 96.8 98.0  PLT 198 185    Recent Labs  05/11/13 0937 05/12/13 0412 05/13/13 0545  NA 139 139 137  K 4.1 4.3 4.5  CL 96 102 104  GLUCOSE 177* 106* 131*  BUN 42* 35* 29*  CREATININE 2.32*  2.21* 1.75* 1.52*  CALCIUM 9.6 8.3* 7.8*    Imaging:  Scheduled Meds: . enoxaparin (LOVENOX) injection  40 mg Subcutaneous Q24H  . insulin aspart  0-5 Units Subcutaneous QHS  . insulin aspart  0-9 Units Subcutaneous TID WC  . metoprolol tartrate  25 mg Oral BID  . oseltamivir  30 mg Oral BID  . pantoprazole  40 mg Oral Daily   Continuous Infusions: . sodium chloride 100 mL/hr at 05/13/13 0534   PRN Meds:.acetaminophen, acetaminophen, guaiFENesin-dextromethorphan, menthol-cetylpyridinium, ondansetron (ZOFRAN) IV, ondansetron, oxymetazoline, phenol   Physical Exam: Filed Vitals:   05/13/13 0500  BP: 146/47  Pulse: 58  Temp: 98.1 F (36.7 C)  Resp: 24   Gen'l - overweight man in no acute distress Cor - 2+ radial pulse, quiet precordium, RRR Pulm - no increased WOB, prolonged expiratory phase, expiratory wheezing throughout Abd- protruberant, BS+ Neuro - A&O x 3      Assessment/Plan: 1. ID/Pulm - influenza with respiratory symptoms. Plan Continue Tamiflu  HHN albuterol qid  2. GI - no report of diarrhea. C. Diff - negative  3. DM -  CBG (last 3)   Recent Labs  05/12/13 1639 05/12/13 2103 05/13/13 0735  GLUCAP 152* 151* 108*   Plan - continue ss  4. Acute renal insufficiency - Creatinine continues to come down.  Plan BMet in AM  5. HTN - stable   Illene Regulus [place] [facility] (o) [phone]; (c) [phone] Call-grp - [facility]  Tele: [phone]  05/13/2013, 9:18 AM

## 2013-05-13 NOTE — Progress Notes (Addendum)
Pt has elevated BP this pm of 173/47. Pt is asymptomatic, resting with eyes closed, and without pain or distress at this time. Norins MD paged. New orders placed. Will continue to monitor. Gilman Schmidt

## 2013-05-14 ENCOUNTER — Inpatient Hospital Stay (HOSPITAL_COMMUNITY): Payer: BC Managed Care – PPO

## 2013-05-14 LAB — GI PATHOGEN PANEL BY PCR, STOOL
Campylobacter by PCR: NEGATIVE
Cryptosporidium by PCR: NEGATIVE
E coli (ETEC) LT/ST: NEGATIVE
E coli (STEC): NEGATIVE
G lamblia by PCR: NEGATIVE
Norovirus GI/GII: NEGATIVE
Rotavirus A by PCR: NEGATIVE
Salmonella by PCR: NEGATIVE
Shigella by PCR: NEGATIVE

## 2013-05-14 LAB — BASIC METABOLIC PANEL
BUN: 22 mg/dL (ref 6–23)
CO2: 26 mEq/L (ref 19–32)
Calcium: 7.5 mg/dL — ABNORMAL LOW (ref 8.4–10.5)
Chloride: 107 mEq/L (ref 96–112)
Creatinine, Ser: 1.29 mg/dL (ref 0.50–1.35)

## 2013-05-14 LAB — GLUCOSE, CAPILLARY: Glucose-Capillary: 164 mg/dL — ABNORMAL HIGH (ref 70–99)

## 2013-05-14 MED ORDER — OSELTAMIVIR PHOSPHATE 75 MG PO CAPS
75.0000 mg | ORAL_CAPSULE | Freq: Every day | ORAL | Status: AC
  Administered 2013-05-15: 75 mg via ORAL
  Filled 2013-05-14: qty 1

## 2013-05-14 MED ORDER — HYDRALAZINE HCL 10 MG PO TABS
10.0000 mg | ORAL_TABLET | Freq: Two times a day (BID) | ORAL | Status: DC
  Administered 2013-05-14 – 2013-05-17 (×7): 10 mg via ORAL
  Filled 2013-05-14 (×9): qty 1

## 2013-05-14 MED ORDER — INSULIN DETEMIR 100 UNIT/ML ~~LOC~~ SOLN
20.0000 [IU] | Freq: Two times a day (BID) | SUBCUTANEOUS | Status: DC
  Administered 2013-05-14 – 2013-05-17 (×7): 20 [IU] via SUBCUTANEOUS
  Filled 2013-05-14 (×8): qty 0.2

## 2013-05-14 MED ORDER — OSELTAMIVIR PHOSPHATE 30 MG PO CAPS
30.0000 mg | ORAL_CAPSULE | Freq: Two times a day (BID) | ORAL | Status: AC
  Administered 2013-05-14: 30 mg via ORAL
  Filled 2013-05-14: qty 1

## 2013-05-14 MED ORDER — PREDNISONE 50 MG PO TABS
60.0000 mg | ORAL_TABLET | Freq: Every day | ORAL | Status: DC
  Administered 2013-05-15 – 2013-05-16 (×2): 60 mg via ORAL
  Filled 2013-05-14 (×3): qty 1

## 2013-05-14 MED ORDER — FUROSEMIDE 40 MG PO TABS
40.0000 mg | ORAL_TABLET | Freq: Every day | ORAL | Status: DC
  Administered 2013-05-14 – 2013-05-17 (×4): 40 mg via ORAL
  Filled 2013-05-14 (×5): qty 1

## 2013-05-14 NOTE — Progress Notes (Addendum)
Recheck BP was 148/52.

## 2013-05-14 NOTE — Progress Notes (Signed)
Subjective: Wheezing and chest congestion  Objective: Vital signs in last 24 hours: Temp:  [97.7 F (36.5 C)-98.8 F (37.1 C)] 98 F (36.7 C) (12/26 0540) Pulse Rate:  [57-75] 61 (12/26 0540) Resp:  [19-24] 19 (12/26 0540) BP: (132-173)/(46-59) 154/59 mmHg (12/26 0540) SpO2:  [94 %-98 %] 98 % (12/26 0540) Weight:  [88.814 kg (195 lb 12.8 oz)] 88.814 kg (195 lb 12.8 oz) (12/25 2203) Weight change:  Last BM Date: 05/13/13  Intake/Output from previous day: 12/25 0701 - 12/26 0700 In: 840 [P.O.:840] Out: 800 [Urine:800] Intake/Output this shift: Total I/O In: 240 [P.O.:240] Out: -   General appearance: alert and cooperative Resp: wheezes bilaterally Cardio: regular rate and rhythm, S1, S2 normal, no murmur, click, rub or gallop Extremities: edema 1+  Lab Results:  Recent Labs  05/11/13 0937 05/11/13 1517  WBC 9.2 8.7  HGB 13.1 12.6*  HCT 39.9 38.7*  PLT 198 185   BMET  Recent Labs  05/13/13 0545 05/14/13 0438  NA 137 140  K 4.5 4.1  CL 104 107  CO2 26 26  GLUCOSE 131* 153*  BUN 29* 22  CREATININE 1.52* 1.29  CALCIUM 7.8* 7.5*    Studies/Results: No results found.  Medications: I have reviewed the patient's current medications.  Assessment/Plan: Principal Problem:  Influenza A, on tamiflu (increase risk complication given age and diabetes), supportive care.  Has developed wheezing, check CXR and BNP to rule out cardiac, add diuretics vs po steroids depending on results  Active Problems:  Acute renal failure resolved, discontinue IVFs,  Diarrhea has not been a problem in hospital, c diff negative Diabetes SSI Restart levemir at half dose (home dose 40U twice a day) Hypertension restart ACEI at discharge, on beta blocker   LOS: 3 days   Justin Leach JOSEPH 05/14/2013, 7:00 AM

## 2013-05-14 NOTE — Progress Notes (Signed)
Physical Therapy Treatment Patient Details Name: Justin Leach MRN: [id number] DOB: [birth year] Today's Date: 05/14/2013 Time: 5621-3086 PT Time Calculation (min): 26 min  PT Assessment / Plan / Recommendation  History of Present Illness Justin Leach is an 79 y.o. male with a PMH of DM and HTN who presents with a 4 day history of sore throat, fever, myalgias, dyspnea, cough productive of yellow mucous, but no chest pain.  Patient with influenze, dehydration, acute renal failure.   PT Comments   Pt able to increase ambulation today but required supervision for safety. Pt was reaching for UE support at times but does not believe he needs an AD. Pt encouraged to amb 2-3 times per day to increase mobility and increase strength. Will cont to follow per POC.  Follow Up Recommendations  No PT follow up;Supervision/Assistance - 24 hour     Does the patient have the potential to tolerate intense rehabilitation     Barriers to Discharge        Equipment Recommendations  None recommended by PT    Recommendations for Other Services    Frequency Min 3X/week   Progress towards PT Goals Progress towards PT goals: Progressing toward goals  Plan Current plan remains appropriate    Precautions / Restrictions Precautions Precautions: None Restrictions Weight Bearing Restrictions: No   Pertinent Vitals/Pain No c/o pain.     Mobility  Bed Mobility Bed Mobility: Not assessed Details for Bed Mobility Assistance: pt sitting in bathroom; returned to chair  Transfers Transfers: Sit to Stand;Stand to Sit Sit to Stand: 5: Supervision;With upper extremity assist;From toilet Stand to Sit: 5: Supervision;To chair/3-in-1;With armrests;With upper extremity assist Details for Transfer Assistance: supervision for safety and cues for hand placement; pt relied heavily on hand rails for transfer from toilet to standing  Ambulation/Gait Ambulation/Gait Assistance: 5: Supervision Ambulation Distance  (Feet): 100 Feet Assistive device: None Ambulation/Gait Assistance Details: pt reaching for UE support at times to brace himself; supervision for safety; no LOB noted; pt increased fatigued with ambulating  Gait Pattern: Within Functional Limits Gait velocity: decreased  Stairs: No Wheelchair Mobility Wheelchair Mobility: No    Exercises General Exercises - Lower Extremity Ankle Circles/Pumps: AROM;Both;10 reps;Seated Long Arc Quad: AROM;Strengthening;Both;10 reps;Seated Hip Flexion/Marching: AROM;Both;10 reps;Seated;Strengthening   PT Diagnosis:    PT Problem List:   PT Treatment Interventions:     PT Goals (current goals can now be found in the care plan section) Acute Rehab PT Goals Patient Stated Goal: to not have diarrhea again  PT Goal Formulation: With patient Time For Goal Achievement: 05/19/13 Potential to Achieve Goals: Good  Visit Information  Last PT Received On: 05/14/13 Assistance Needed: +1 History of Present Illness: Justin Leach is an 79 y.o. male with a PMH of DM and HTN who presents with a 4 day history of sore throat, fever, myalgias, dyspnea, cough productive of yellow mucous, but no chest pain.  Patient with influenze, dehydration, acute renal failure.    Subjective Data  Subjective: pt sitting in bathroom; reported he had had diarrhea and had "messed up" his underwear. pt given towels and was SBA to complete ADLs Patient Stated Goal: to not have diarrhea again    Cognition  Cognition Arousal/Alertness: Awake/alert Behavior During Therapy: WFL for tasks assessed/performed Overall Cognitive Status: Within Functional Limits for tasks assessed    Balance  Balance Balance Assessed: Yes Static Standing Balance Static Standing - Balance Support: No upper extremity supported;During functional activity Static Standing - Level of Assistance:  5: Stand by assistance Static Standing - Comment/# of Minutes: tolerated standing at sink ~3 min  Dynamic  Standing Balance Dynamic Standing - Balance Support: No upper extremity supported;During functional activity Dynamic Standing - Level of Assistance: 5: Stand by assistance  End of Session PT - End of Session Equipment Utilized During Treatment: Gait belt Activity Tolerance: Patient tolerated treatment well Patient left: in chair;with call bell/phone within reach Nurse Communication: Mobility status   GP     Donell Sievert, [place] [phone] 05/14/2013, 1:02 PM

## 2013-05-14 NOTE — Progress Notes (Signed)
PHARMACIST - PHYSICIAN COMMUNICATION DR: Valentina Lucks CONCERNING:  Oseltamivir shortage  DESCRIPTION:  This patient has an order for Oseltamivir (Tamiflu) and has an Estimated Creatinine Clearance: 50.3 ml/min (by C-G formula based on Cr of 1.29)..  The package insert for Oseltamivir recommends 30mg  BID x 5 days for patients with CrCl 30-60 ml/min.  [facility] is experiencing a significant shortage of Oseltamivir 30mg  capsules.    To preserve an adequate supply of 30mg  capsules for our patients with more significant renal impairment the Infectious Diseases team has recommended to substitute Oseltamivir 75mg  qday x 5 days for patients with CrCl 30-60 ml/min.    RECOMMENDATION: Oseltamivir 75mg  PO qday to complete 5 days has been substituted for your patient. If you have any questions about this temporary substitution please feel free to call the Pharmacy at [phone] for assistance.  Estella Husk, Pharm.D., BCPS, AAHIVP Clinical Pharmacist Phone: [phone] or [phone] Pager: [phone] 05/14/2013, 3:07 PM

## 2013-05-15 DIAGNOSIS — J9801 Acute bronchospasm: Secondary | ICD-10-CM | POA: Diagnosis not present

## 2013-05-15 LAB — GLUCOSE, CAPILLARY
Glucose-Capillary: 70 mg/dL (ref 70–99)
Glucose-Capillary: 114 mg/dL — ABNORMAL HIGH (ref 70–99)
Glucose-Capillary: 211 mg/dL — ABNORMAL HIGH (ref 70–99)

## 2013-05-15 LAB — BASIC METABOLIC PANEL
BUN: 16 mg/dL (ref 6–23)
CO2: 24 mEq/L (ref 19–32)
Chloride: 104 mEq/L (ref 96–112)
GFR calc Af Amer: 60 mL/min — ABNORMAL LOW (ref >90)
GFR calc non Af Amer: 52 mL/min — ABNORMAL LOW (ref >90)
Potassium: 4.1 mEq/L (ref 3.5–5.1)
Sodium: 136 mEq/L (ref 135–145)

## 2013-05-15 NOTE — Progress Notes (Signed)
Assessment/Plan: Principal Problem:   Influenza with respiratory manifestations - he is better after he has started use of albuterol. Still with a significant cough and some lower chest pain from that.  Active Problems:   Acute renal failure   Diarrhea   Diabetes - control is adequate even with prednisone.    Hypertension   Bronchospasm - improved.   Subjective: Feels about the same except feels like his wheezing is a bit better.   Objective:  Vital Signs: Filed Vitals:   05/14/13 2224 05/14/13 2306 05/15/13 0523 05/15/13 0935  BP: 168/55  165/68 144/56  Pulse: 66  62 94  Temp:   98.9 F (37.2 C) 98.1 F (36.7 C)  TempSrc:   Oral Oral  Resp:   18 20  Height:      Weight:      SpO2:  96% 97% 98%     EXAM: no wheeze.    Intake/Output Summary (Last 24 hours) at 05/15/13 1038 Last data filed at 05/15/13 0900  Gross per 24 hour  Intake    960 ml  Output      0 ml  Net    960 ml    Lab Results:  Recent Labs  05/13/13 0545 05/14/13 0438  NA 137 140  K 4.5 4.1  CL 104 107  CO2 26 26  GLUCOSE 131* 153*  BUN 29* 22  CREATININE 1.52* 1.29  CALCIUM 7.8* 7.5*   No results found for this basename: AST, ALT, ALKPHOS, BILITOT, PROT, ALBUMIN,  in the last 72 hours No results found for this basename: LIPASE, AMYLASE,  in the last 72 hours No results found for this basename: WBC, NEUTROABS, HGB, HCT, MCV, PLT,  in the last 72 hours No results found for this basename: CKTOTAL, CKMB, CKMBINDEX, TROPONINI,  in the last 72 hours BNP    Component Value Date/Time   PROBNP 2240.0* 05/14/2013 0438   No results found for this basename: DDIMER,  in the last 72 hours No results found for this basename: HGBA1C,  in the last 72 hours No results found for this basename: CHOL, HDL, LDLCALC, TRIG, CHOLHDL, LDLDIRECT,  in the last 72 hours No results found for this basename: TSH, T4TOTAL, FREET3, T3FREE, THYROIDAB,  in the last 72 hours No results found for this basename: VITAMINB12,  FOLATE, FERRITIN, TIBC, IRON, RETICCTPCT,  in the last 72 hours  Studies/Results: Dg Chest 2 View  05/14/2013   CLINICAL DATA:  Wheezing.  Flu.  EXAM: CHEST  2 VIEW  COMPARISON:  05/11/2013.  FINDINGS: Mediastinum and hilar structures are normal. Poor inspiration with mild basilar atelectasis. Nodular density projected in each lung base,most likely represent nipple shadows. As recommended on prior study repeat frontal view with nipple markers is recommended. Heart size and pulmonary vascularity stable. No pleural effusion or pneumothorax. Surgical clips right upper quadrant.  IMPRESSION: 1. Mild basilar subsegmental atelectasis with poor inspiration. 2. As noted on prior study nodular density noted both lung bases, most likely nipple shadows. Repeat frontal view with nipple markers suggested.   Electronically Signed   By: Maisie Fus  Register   On: 05/14/2013 08:18   Medications: Medications administered in the last 24 hours reviewed.  Current Medication List reviewed.    LOS: 4 days   [facility] @ [facility] [phone]) 05/15/2013, 10:38 AM

## 2013-05-16 LAB — STOOL CULTURE

## 2013-05-16 LAB — GLUCOSE, CAPILLARY
Glucose-Capillary: 226 mg/dL — ABNORMAL HIGH (ref 70–99)
Glucose-Capillary: 121 mg/dL — ABNORMAL HIGH (ref 70–99)
Glucose-Capillary: 274 mg/dL — ABNORMAL HIGH (ref 70–99)

## 2013-05-16 MED ORDER — PREDNISONE 20 MG PO TABS
40.0000 mg | ORAL_TABLET | Freq: Every day | ORAL | Status: DC
  Administered 2013-05-17: 40 mg via ORAL
  Filled 2013-05-16 (×3): qty 2

## 2013-05-16 NOTE — Progress Notes (Signed)
Assessment/Plan: Principal Problem:   Influenza with respiratory manifestations - improving slowly. Should be ready for d/c in 1-2 days Active Problems:   Acute renal failure   Diarrhea   Diabetes   Hypertension - BP is up some. No changes in meds for now. He is on all of his home meds.    Bronchospasm - will lower prednisone dose (but already given today, so won't be decreased until tomorrow)   Subjective: Feels a little better. No chest pain, dyspnea. Still a lot of lower chest/upper abd pain with cough.   Objective:  Vital Signs: Filed Vitals:   05/15/13 1758 05/15/13 2056 05/15/13 2100 05/16/13 0703  BP: 177/60  170/62 180/53  Pulse: 70  72 58  Temp: 97.9 F (36.6 C)  98.1 F (36.7 C) 98.4 F (36.9 C)  TempSrc: Oral  Oral Oral  Resp: 20  20 20   Height:      Weight:      SpO2: 95% 97% 95% 97%     EXAM: LUNGS: clear.    Intake/Output Summary (Last 24 hours) at 05/16/13 0855 Last data filed at 05/15/13 1300  Gross per 24 hour  Intake    600 ml  Output      0 ml  Net    600 ml    Lab Results:  Recent Labs  05/14/13 0438 05/15/13 1130  NA 140 136  K 4.1 4.1  CL 107 104  CO2 26 24  GLUCOSE 153* 104*  BUN 22 16  CREATININE 1.29 1.27  CALCIUM 7.5* 8.1*   No results found for this basename: AST, ALT, ALKPHOS, BILITOT, PROT, ALBUMIN,  in the last 72 hours No results found for this basename: LIPASE, AMYLASE,  in the last 72 hours No results found for this basename: WBC, NEUTROABS, HGB, HCT, MCV, PLT,  in the last 72 hours No results found for this basename: CKTOTAL, CKMB, CKMBINDEX, TROPONINI,  in the last 72 hours BNP    Component Value Date/Time   PROBNP 2240.0* 05/14/2013 0438   No results found for this basename: DDIMER,  in the last 72 hours No results found for this basename: HGBA1C,  in the last 72 hours No results found for this basename: CHOL, HDL, LDLCALC, TRIG, CHOLHDL, LDLDIRECT,  in the last 72 hours No results found for this basename: TSH,  T4TOTAL, FREET3, T3FREE, THYROIDAB,  in the last 72 hours No results found for this basename: VITAMINB12, FOLATE, FERRITIN, TIBC, IRON, RETICCTPCT,  in the last 72 hours  Studies/Results: No results found. Medications: Medications administered in the last 24 hours reviewed.  Current Medication List reviewed.    LOS: 5 days   [facility] Internal Medicine @ [facility] [phone]) 05/16/2013, 8:55 AM

## 2013-05-17 MED ORDER — PREDNISONE (PAK) 10 MG PO TABS: ORAL_TABLET | Freq: Every day | ORAL | Status: DC

## 2013-05-17 MED ORDER — ALBUTEROL SULFATE (2.5 MG/3ML) 0.083% IN NEBU: 2.5000 mg | INHALATION_SOLUTION | Freq: Four times a day (QID) | RESPIRATORY_TRACT | Status: DC

## 2013-05-17 NOTE — Progress Notes (Signed)
Physical Therapy Treatment Patient Details Name: Justin Leach MRN: [id number] DOB: 1931/11/06 Today's Date: 05/17/2013 Time: 8295-6213 PT Time Calculation (min): 10 min  PT Assessment / Plan / Recommendation  History of Present Illness Justin Leach is an 79 y.o. male with a PMH of DM and HTN who presents with a 4 day history of sore throat, fever, myalgias, dyspnea, cough productive of yellow mucous, but no chest pain.  Patient with influenze, dehydration, acute renal failure.   PT Comments   Pt able to meet goals for mobility today. Pt at supervision to mod I level for all mobility. Able to ambulate steps with supervision for safety. Pt safe from mobility standpoint to D/C home today with wife.   Follow Up Recommendations  No PT follow up;Supervision/Assistance - 24 hour     Does the patient have the potential to tolerate intense rehabilitation     Barriers to Discharge        Equipment Recommendations  None recommended by PT    Recommendations for Other Services    Frequency Min 3X/week   Progress towards PT Goals Progress towards PT goals: Goals met/education completed, patient discharged from PT  Plan Current plan remains appropriate    Precautions / Restrictions Precautions Precautions: None Restrictions Weight Bearing Restrictions: No   Pertinent Vitals/Pain No complaints.     Mobility  Bed Mobility Bed Mobility: Not assessed Transfers Transfers: Sit to Stand;Stand to Sit Sit to Stand: 6: Modified independent (Device/Increase time);From bed Stand to Sit: 6: Modified independent (Device/Increase time);To bed Details for Transfer Assistance: pt demo good technique with transfers  Ambulation/Gait Ambulation/Gait Assistance: 5: Supervision Ambulation Distance (Feet): 150 Feet Assistive device: None Ambulation/Gait Assistance Details: supervision for safety; pt not reaching for UE support at this time; no LOB noted  Gait Pattern: Within Functional  Limits Gait velocity: decreased  Stairs: Yes Stairs Assistance: 5: Supervision Stairs Assistance Details (indicate cue type and reason): supervision for safety  Stair Management Technique: Two rails;Alternating pattern;Forwards Number of Stairs: 4         PT Diagnosis:    PT Problem List:   PT Treatment Interventions:     PT Goals (current goals can now be found in the care plan section) Acute Rehab PT Goals Patient Stated Goal: to go home today  PT Goal Formulation: With patient Time For Goal Achievement: 05/19/13 Potential to Achieve Goals: Good  Visit Information  Last PT Received On: 05/17/13 Assistance Needed: +1 History of Present Illness: Justin Leach is an 79 y.o. male with a PMH of DM and HTN who presents with a 4 day history of sore throat, fever, myalgias, dyspnea, cough productive of yellow mucous, but no chest pain.  Patient with influenze, dehydration, acute renal failure.    Subjective Data  Subjective: pt sitting on edge of bed; "im going home today. we can do what we need to do though"  Patient Stated Goal: to go home today    Cognition  Cognition Arousal/Alertness: Awake/alert Behavior During Therapy: WFL for tasks assessed/performed Overall Cognitive Status: Within Functional Limits for tasks assessed    Balance  Balance Balance Assessed: Yes Static Standing Balance Static Standing - Balance Support: No upper extremity supported;During functional activity Static Standing - Level of Assistance: 5: Stand by assistance  End of Session PT - End of Session Activity Tolerance: Patient tolerated treatment well Patient left: in bed;with call bell/phone within reach Nurse Communication: Mobility status   GP     Chad, [place],  PT [phone] 05/17/2013, 11:54 AM

## 2013-05-17 NOTE — Progress Notes (Signed)
Patient discharged.  Patient educated on discharge instructions, follow-up appointment, and discharge medications.  Prescription for prednisone sent electronically to [facility].  Patient educated on influenza, signs and symptoms, and when to call a doctor.  Patient verbalized understanding.  AVS signed.  Patient belongings gathered.  No IV to discontinue.  Patient escorted via wheelchair to ride with NT.

## 2013-05-17 NOTE — Discharge Summary (Signed)
Physician Discharge Summary  Patient ID: Justin Leach MRN: [id number] DOB/AGE: 1931-07-21 79 y.o.  Admit date: 05/11/2013 Discharge date: 05/17/2013  Admission Diagnoses:  Discharge Diagnoses:  Principal Problem:   Influenza with respiratory manifestations Active Problems:   Acute renal failure   Diarrhea   Diabetes   Hypertension   Bronchospasm   Discharged Condition: stable  Hospital Course:  Patient presented to the hospital with Flu-like symptoms, he was initially evaluated in his PCP office. Because of poor by mouth intake and concern for dehydration he was sent to the hospital.in the ED patient's labs revealed elevation in creatinine, chest x-ray without acute infiltrate. Because of his symptoms he was admitted, started empirically on treatment for influenza. Ultimately he was determined to be positive for influenza A, he was continued on Tamiflu, patient's azotemia responded well to IV fluids. Patient was seen by physical therapy, no discharge needs were required. Ultimately his home medications were resumed. At this time is felt to be stable for discharge. He has completed his course of Tamiflu in the hospital. Prednisone taper has been added because of bronchospasms he will continue to taper his steroids at home.  Consults:    Significant Diagnostic Studies:Dg Chest 2 View  05/14/2013   CLINICAL DATA:  Wheezing.  Flu.  EXAM: CHEST  2 VIEW  COMPARISON:  05/11/2013.  FINDINGS: Mediastinum and hilar structures are normal. Poor inspiration with mild basilar atelectasis. Nodular density projected in each lung base,most likely represent nipple shadows. As recommended on prior study repeat frontal view with nipple markers is recommended. Heart size and pulmonary vascularity stable. No pleural effusion or pneumothorax. Surgical clips right upper quadrant.  IMPRESSION: 1. Mild basilar subsegmental atelectasis with poor inspiration. 2. As noted on prior study nodular density noted  both lung bases, most likely nipple shadows. Repeat frontal view with nipple markers suggested.   Electronically Signed   By: Maisie Fus  Register   On: 05/14/2013 08:18   Dg Chest 2 View  05/11/2013   CLINICAL DATA:  Shortness of breath, weakness  EXAM: CHEST  2 VIEW  COMPARISON:  None.  FINDINGS: Cardiomediastinal silhouette is stable. No acute infiltrate or pleural effusion. No pulmonary edema. Probable bilateral nodular nipple shadow. Repeat frontal view with nipple markers is recommended for confirmation. Osteopenia and mild degenerative changes thoracic spine. Stable mild compression deformity upper lumbar spine.  IMPRESSION: No acute infiltrate or pleural effusion. No pulmonary edema. Probable bilateral nodular nipple shadow. Repeat frontal view with nipple markers is recommended for confirmation. Osteopenia and mild degenerative changes thoracic spine.   Electronically Signed   By: Natasha Mead M.D.   On: 05/11/2013 10:20      Discharge Exam: Blood pressure 164/69, pulse 59, temperature 97.7 F (36.5 C), temperature source Oral, resp. rate 20, height 5\' 8"  (1.727 m), weight 88.814 kg (195 lb 12.8 oz), SpO2 93.00%. General appearance: alert and cooperative Resp: moderate air movement bilateral without rales wheezes or rhonchi are Cardio: regular rate and rhythm, S1, S2 normal, no murmur, click, rub or gallop Extremities: extremities normal, atraumatic, no cyanosis or edema  Disposition:      Medication List         aspirin 325 MG tablet  Take 325 mg by mouth daily.     fosinopril 40 MG tablet  Commonly known as:  MONOPRIL  Take 40 mg by mouth daily.     furosemide 40 MG tablet  Commonly known as:  LASIX  Take 40 mg by mouth daily.  hydrALAZINE 10 MG tablet  Commonly known as:  APRESOLINE  Take 10 mg by mouth daily.     LEVEMIR 100 UNIT/ML injection  Generic drug:  insulin detemir     metoprolol tartrate 25 MG tablet  Commonly known as:  LOPRESSOR  Take 25 mg by mouth 2  (two) times daily.     multivitamin with minerals Tabs tablet  Take 1 tablet by mouth daily.     omeprazole 20 MG capsule  Commonly known as:  PRILOSEC  Take 20 mg by mouth daily.     predniSONE 10 MG tablet  Commonly known as:  STERAPRED UNI-PAK  Take by mouth daily. Take 2 tablets daily for 2 days then one tablet daily for 3 days then stop     simvastatin 80 MG tablet  Commonly known as:  ZOCOR  Take 40 mg by mouth at bedtime.     vitamin B-12 1000 MCG tablet  Commonly known as:  CYANOCOBALAMIN  Take 1,000 mcg by mouth daily.     Vitamin D-3 1000 UNITS Caps  Take 1 capsule by mouth daily.           Follow-up Information   Follow up with Lillia Mountain, MD. Call in 1 week.   Specialty:  Internal Medicine   Contact information:   [address] [place] [address] [phone]       Signed: Katy Apo 05/17/2013, 1:05 PM

## 2013-07-29 DIAGNOSIS — E11311 Type 2 diabetes mellitus with unspecified diabetic retinopathy with macular edema: Secondary | ICD-10-CM | POA: Diagnosis not present

## 2013-07-29 DIAGNOSIS — E11359 Type 2 diabetes mellitus with proliferative diabetic retinopathy without macular edema: Secondary | ICD-10-CM | POA: Diagnosis not present

## 2013-07-29 DIAGNOSIS — Z961 Presence of intraocular lens: Secondary | ICD-10-CM | POA: Diagnosis not present

## 2013-11-16 DIAGNOSIS — R3129 Other microscopic hematuria: Secondary | ICD-10-CM | POA: Diagnosis not present

## 2013-11-16 DIAGNOSIS — N400 Enlarged prostate without lower urinary tract symptoms: Secondary | ICD-10-CM | POA: Diagnosis not present

## 2014-02-15 DIAGNOSIS — E785 Hyperlipidemia, unspecified: Secondary | ICD-10-CM | POA: Diagnosis not present

## 2014-02-15 DIAGNOSIS — K219 Gastro-esophageal reflux disease without esophagitis: Secondary | ICD-10-CM | POA: Diagnosis not present

## 2014-02-15 DIAGNOSIS — I10 Essential (primary) hypertension: Secondary | ICD-10-CM | POA: Diagnosis not present

## 2014-02-15 DIAGNOSIS — E669 Obesity, unspecified: Secondary | ICD-10-CM | POA: Diagnosis not present

## 2014-02-15 DIAGNOSIS — H3500 Unspecified background retinopathy: Secondary | ICD-10-CM | POA: Diagnosis not present

## 2014-02-15 DIAGNOSIS — Z23 Encounter for immunization: Secondary | ICD-10-CM | POA: Diagnosis not present

## 2014-02-15 DIAGNOSIS — Z0000 Encounter for general adult medical examination without abnormal findings: Secondary | ICD-10-CM | POA: Diagnosis not present

## 2014-02-15 DIAGNOSIS — E1139 Type 2 diabetes mellitus with other diabetic ophthalmic complication: Secondary | ICD-10-CM | POA: Diagnosis not present

## 2014-02-15 DIAGNOSIS — N183 Chronic kidney disease, stage 3 unspecified: Secondary | ICD-10-CM | POA: Diagnosis not present

## 2014-03-01 ENCOUNTER — Other Ambulatory Visit: Payer: Self-pay | Admitting: Internal Medicine

## 2014-03-01 ENCOUNTER — Ambulatory Visit
Admission: RE | Admit: 2014-03-01 | Discharge: 2014-03-01 | Disposition: A | Discharge status: Home / Self Care | Payer: BC Managed Care – PPO | Source: Ambulatory Visit | Attending: Internal Medicine | Admitting: Internal Medicine

## 2014-03-01 DIAGNOSIS — R0609 Other forms of dyspnea: Secondary | ICD-10-CM | POA: Diagnosis not present

## 2014-03-01 DIAGNOSIS — R0600 Dyspnea, unspecified: Secondary | ICD-10-CM

## 2014-03-01 DIAGNOSIS — R5383 Other fatigue: Secondary | ICD-10-CM | POA: Diagnosis not present

## 2014-03-02 ENCOUNTER — Telehealth: Payer: Self-pay | Admitting: Cardiology

## 2014-03-02 NOTE — Telephone Encounter (Signed)
Received 10 pages of records from Dr Lavone Orn --[facility] at [facility] for New Patient appointment with Dr Percival Spanish on 03/29/14  Records given to [facility] in Medical Records for Dr Cherlyn Cushing schedule of 03/29/14.  lp

## 2014-03-25 ENCOUNTER — Encounter: Payer: Self-pay | Admitting: *Deleted

## 2014-03-29 ENCOUNTER — Ambulatory Visit (INDEPENDENT_AMBULATORY_CARE_PROVIDER_SITE_OTHER): Payer: BC Managed Care – PPO | Admitting: Cardiology

## 2014-03-29 ENCOUNTER — Encounter: Payer: Self-pay | Admitting: Cardiology

## 2014-03-29 VITALS — BP 144/58 | Ht 66.0 in | Wt 196.5 lb

## 2014-03-29 DIAGNOSIS — R001 Bradycardia, unspecified: Secondary | ICD-10-CM

## 2014-03-29 DIAGNOSIS — R0609 Other forms of dyspnea: Secondary | ICD-10-CM | POA: Diagnosis not present

## 2014-03-29 DIAGNOSIS — R0600 Dyspnea, unspecified: Secondary | ICD-10-CM

## 2014-03-29 DIAGNOSIS — R42 Dizziness and giddiness: Secondary | ICD-10-CM | POA: Diagnosis not present

## 2014-03-29 DIAGNOSIS — R0602 Shortness of breath: Secondary | ICD-10-CM | POA: Insufficient documentation

## 2014-03-29 NOTE — Progress Notes (Signed)
HPI The patient has no past cardiac history. He does have significant cardiovascular risk factors. He did apparently have a stress test in 2008. This was not the [place] and is reported to be normal. I don't have these results. For about 2 years he has had progressive fatigue. This has been worse and now is to the point where he can't do his usual walking. He might make it about 15 minutes before he will get fatigued and short of breath. He has to stop what he is doing. He does not get chest pressure, neck or arm discomfort as far as he can recall. He does not have resting shortness of breath, PND or orthopnea. However, for 30 years he has slept in a recliner. He does not have weight gain or edema.  Allergies  Allergen Reactions  . Nifedipine Other (See Comments)    Reaction: made gums swell up. Pt states that he had to have gum surgery after taking.     Current Outpatient Prescriptions  Medication Sig Dispense Refill  . aspirin 325 MG tablet Take 325 mg by mouth daily.    Marland Kitchen atorvastatin (LIPITOR) 80 MG tablet Take 40 mg by mouth daily.  2  . Cholecalciferol (VITAMIN D-3) 1000 UNITS CAPS Take 1 capsule by mouth daily.    . fosinopril (MONOPRIL) 40 MG tablet Take 40 mg by mouth daily.     . furosemide (LASIX) 40 MG tablet Take 40 mg by mouth daily.     . hydrALAZINE (APRESOLINE) 10 MG tablet Take 10 mg by mouth daily.     Marland Kitchen LEVEMIR 100 UNIT/ML injection     . metoprolol (LOPRESSOR) 50 MG tablet Take 50 mg by mouth 2 (two) times daily.    . Multiple Vitamin (MULTIVITAMIN WITH MINERALS) TABS tablet Take 1 tablet by mouth daily.    Marland Kitchen omeprazole (PRILOSEC) 20 MG capsule Take 20 mg by mouth daily.     . predniSONE (STERAPRED UNI-PAK) 10 MG tablet Take by mouth daily. Take 2 tablets daily for 2 days then one tablet daily for 3 days then stop 7 tablet 0  . vitamin B-12 (CYANOCOBALAMIN) 1000 MCG tablet Take 1,000 mcg by mouth daily.     No current facility-administered medications for this visit.     Past Medical History  Diagnosis Date  . Hypertension   . Type II diabetes mellitus   . Pneumonia 2007  . Cervical disc disease   . Dyslipidemia   . CKD (chronic kidney disease)     Past Surgical History  Procedure Laterality Date  . Eye surgery  1930's    "don't know what for" (05/11/2013)  . Cholecystectomy    . Cervical disc surgery      "i've had 3 neck ORs; not sure what kind; all thru the back of my neck"  . Posterior fusion cervical spine    . Knee arthroscopy      "had one scope; one open knee OR; not sure which on which side" (05/10/2013)  . Knee arthroplasty      "had one scope; one open knee OR; not sure which on which side" (05/10/2013)  . Rotator cuff repair Bilateral   . Joint replacement      bilateral knees, elbows, and shoulders (on 05/11/2013 pt denies all joint replacements"   . Cataract extraction w/ intraocular lens  implant, bilateral Bilateral     Family History  Problem Relation Age of Onset  . Heart disease Neg Hx  He does not know his father's history.   . Cancer Neg Hx   . Diabetes Neg Hx     History   Social History  . Marital Status: Married    Spouse Name: Vaughan Basta    Number of Children: 0  . Years of Education: N/A   Occupational History  . Retired from Architect work.    Social History Main Topics  . Smoking status: Never Smoker   . Smokeless tobacco: Never Used  . Alcohol Use: No  . Drug Use: No  . Sexual Activity: No   Other Topics Concern  . Not on file   Social History Narrative   Married.  Lives with wife.  Ambulates independently.  Has a dog names is named Programmer, applications  .    ROS:  PHYSICAL EXAM BP 144/58 mmHg  Ht 5\' 6"  (1.676 m)  Wt 196 lb 8 oz (89.132 kg)  BMI 31.73 kg/m2  GENERAL:  Well appearing HEENT:  Pupils equal round and reactive, fundi not visualized, oral mucosa unremarkable NECK:  No jugular venous distention, waveform within normal limits, carotid upstroke brisk and symmetric, no bruits, no  thyromegaly LYMPHATICS:  No cervical, inguinal adenopathy LUNGS:  Clear to auscultation bilaterally BACK:  No CVA tenderness CHEST:  Unremarkable HEART:  PMI not displaced or sustained,S1 and S2 within normal limits, no S3, no S4, no clicks, no rubs, no murmurs ABD:  Flat, positive bowel sounds normal in frequency in pitch, no bruits, no rebound, no guarding, no midline pulsatile mass, no hepatomegaly, no splenomegaly EXT:  2 plus pulses throughout, no edema, no cyanosis no clubbing SKIN:  No rashes no nodules NEURO:  Cranial nerves II through XII grossly intact, motor grossly intact throughout PSYCH:  Cognitively intact, oriented to person place and time  EKG:  Sinus rhythm, rate 61, axis within normal limits, first degree AV block, no acute ST-T wave changes. 03/29/2014  ASSESSMENT AND PLAN  DOE/FATIGUE:  With his long-standing diabetes this certainly could be an anginal. However, he would not be a walk on a treadmill. He needs screening stress testing. Therefore, I will order a Lexiscan Myoview.  I don't strongly suspect heart failure but I will check a BNP.  BRADYCARDIA:  Walking around the office today his heart rate did go up appropriately to 80 with minimal ambulation. I do not suspect chronotropic incompetence as the etiology of his complaints although I could consider a Holter if the above workup is negative and there is no other etiology.

## 2014-03-29 NOTE — Patient Instructions (Signed)
Schedule Lexiscan Myoview follow instructions given.    Lab work today ( bnp )

## 2014-03-30 DIAGNOSIS — M722 Plantar fascial fibromatosis: Secondary | ICD-10-CM | POA: Diagnosis not present

## 2014-03-31 ENCOUNTER — Telehealth (HOSPITAL_COMMUNITY): Payer: Self-pay

## 2014-03-31 NOTE — Telephone Encounter (Signed)
Encounter complete. 

## 2014-04-01 ENCOUNTER — Telehealth (HOSPITAL_COMMUNITY): Payer: Self-pay

## 2014-04-01 NOTE — Telephone Encounter (Signed)
Encounter complete. 

## 2014-04-05 ENCOUNTER — Ambulatory Visit (HOSPITAL_COMMUNITY)
Admission: RE | Admit: 2014-04-05 | Discharge: 2014-04-05 | Disposition: A | Discharge status: Home / Self Care | Payer: BC Managed Care – PPO | Source: Ambulatory Visit | Attending: Cardiovascular Disease | Admitting: Cardiovascular Disease

## 2014-04-05 DIAGNOSIS — E119 Type 2 diabetes mellitus without complications: Secondary | ICD-10-CM | POA: Diagnosis not present

## 2014-04-05 DIAGNOSIS — I10 Essential (primary) hypertension: Secondary | ICD-10-CM | POA: Diagnosis not present

## 2014-04-05 DIAGNOSIS — Z794 Long term (current) use of insulin: Secondary | ICD-10-CM | POA: Diagnosis not present

## 2014-04-05 DIAGNOSIS — E663 Overweight: Secondary | ICD-10-CM | POA: Insufficient documentation

## 2014-04-05 DIAGNOSIS — R9431 Abnormal electrocardiogram [ECG] [EKG]: Secondary | ICD-10-CM | POA: Insufficient documentation

## 2014-04-05 DIAGNOSIS — E785 Hyperlipidemia, unspecified: Secondary | ICD-10-CM | POA: Diagnosis not present

## 2014-04-05 DIAGNOSIS — R0609 Other forms of dyspnea: Secondary | ICD-10-CM

## 2014-04-05 DIAGNOSIS — R42 Dizziness and giddiness: Secondary | ICD-10-CM | POA: Diagnosis not present

## 2014-04-05 DIAGNOSIS — R001 Bradycardia, unspecified: Secondary | ICD-10-CM | POA: Diagnosis not present

## 2014-04-05 DIAGNOSIS — R0600 Dyspnea, unspecified: Secondary | ICD-10-CM | POA: Diagnosis not present

## 2014-04-05 MED ORDER — AMINOPHYLLINE 25 MG/ML IV SOLN
125.0000 mg | Freq: Once | INTRAVENOUS | Status: AC
  Administered 2014-04-05: 125 mg via INTRAVENOUS

## 2014-04-05 MED ORDER — TECHNETIUM TC 99M SESTAMIBI GENERIC - CARDIOLITE
30.2000 | Freq: Once | INTRAVENOUS | Status: AC | PRN
  Administered 2014-04-05: 30.2 via INTRAVENOUS

## 2014-04-05 MED ORDER — TECHNETIUM TC 99M SESTAMIBI GENERIC - CARDIOLITE
10.6000 | Freq: Once | INTRAVENOUS | Status: AC | PRN
  Administered 2014-04-05: 11 via INTRAVENOUS

## 2014-04-05 MED ORDER — REGADENOSON 0.4 MG/5ML IV SOLN
0.4000 mg | Freq: Once | INTRAVENOUS | Status: AC
  Administered 2014-04-05: 0.4 mg via INTRAVENOUS

## 2014-04-05 NOTE — Procedures (Addendum)
[facility] [address] [place] [address] [phone]  Cardiology Nuclear Med Study  Justin Leach is a 80 y.o. male     MRN : [id number]     DOB: 1932-04-07  Procedure Date: 04/05/2014  Nuclear Med Background Indication for Stress Test:  Evaluation for Ischemia and Abnormal EKG History:  No prior cardiac or respiratory history reported;Last NUC MPI in 2008-normal per dictation;CKD Cardiac Risk Factors: Hypertension, IDDM Type 2, Lipids and Overweight  Symptoms:  Dizziness, DOE, Fatigue and Light-Headedness   Nuclear Pre-Procedure Caffeine/Decaff Intake:  1:00am NPO After: 11am   IV Site: R Forearm  IV 0.9% NS with Angio Cath:  22g  Chest Size (in):  42"  IV Started by: Rolene Course, RN  Height: 5\' 6"  (1.676 m)  Cup Size: n/a  BMI:  Body mass index is 31.65 kg/(m^2). Weight:  196 lb (88.905 kg)   Tech Comments:  n/a    Nuclear Med Study 1 or 2 day study: 1 day  Stress Test Type:  [place] Provider:  Minus Breeding, MD   Resting Radionuclide: Technetium 67m Sestamibi  Resting Radionuclide Dose: 10.6 mCi   Stress Radionuclide:  Technetium 53m Sestamibi  Stress Radionuclide Dose: 30.2 mCi           Stress Protocol Rest HR: 54 Stress HR: 66  Rest BP: 151/65 Stress BP: 141/39  Exercise Time (min): n/a METS: n/a   Predicted Max HR: 140 bpm % Max HR: 53.57 bpm Rate Pressure Product: 11325  Dose of Adenosine (mg):  n/a Dose of Lexiscan: 0.4 mg  Dose of Atropine (mg): n/a Dose of Dobutamine: n/a mcg/kg/min (at max HR)  Stress Test Technologist: Leane Para, CCT Nuclear Technologist: Imagene Riches, CNMT   Rest Procedure:  Myocardial perfusion imaging was performed at rest 45 minutes following the intravenous administration of Technetium 68m Sestamibi. Stress Procedure:  The patient received IV Lexiscan 0.4 mg over 15-seconds.  Technetium 31m Sestamibi injected at 30-seconds.  Patient  experienced SOB and Dizziness and 125 mg Aminophylline IV was administered.  There were no significant changes with Lexiscan.  Quantitative spect images were obtained after a 45 minute delay.  Transient Ischemic Dilatation (Normal <1.22):  1.16  QGS EDV:  80 ml QGS ESV:  30 ml LV Ejection Fraction: 63%    Rest ECG: Sinus Bradycardia at 54  Stress ECG: No significant change from baseline ECG  QPS Raw Data Images:  Normal; no motion artifact; normal heart/lung ratio. Stress Images:  Normal homogeneous uptake in all areas of the myocardium. Rest Images:  Normal homogeneous uptake in all areas of the myocardium. Subtraction (SDS):  Normal  Impression Exercise Capacity:  Lexiscan with no exercise. BP Response:  Normal blood pressure response. Clinical Symptoms:  Mild shortness of breath and dizziness ECG Impression:  No significant ST segment change suggestive of ischemia. Comparison with Prior Nuclear Study: No significant change from previous study  Overall Impression:  Normal stress nuclear study.  LV Wall Motion:  NL LV Function, EF 63%; NL Wall Motion   [name] A, MD  04/06/2014 1:14 PM

## 2014-04-11 ENCOUNTER — Telehealth: Payer: Self-pay

## 2014-04-11 NOTE — Telephone Encounter (Signed)
PATIENT CAME IN TO CHECK TO SEE WHERE HIS ORTHOS ARE- HE STATES HE WAS SUPPOS ETO GET THEM WITHIN TWO WEEKS. PLEASE CALL PATIENT

## 2014-04-20 ENCOUNTER — Telehealth: Payer: Self-pay

## 2014-04-20 NOTE — Telephone Encounter (Signed)
Spoke with patient and informed him that his 2nd pair of orthotics were ordered through [employer] today and we would inform him when they arrive

## 2014-06-24 ENCOUNTER — Telehealth: Payer: Self-pay | Admitting: *Deleted

## 2014-06-24 NOTE — Telephone Encounter (Signed)
PATIENT PICKED UP ORTHOTICS ORDERED IN November 2015.  AFTER RESEARCHING ORTHOTICS WERE SUPPOSED TO HAVE BEEN BILLED TO [employer] IN November PATIENT PAID WHAT HE WAS QUOTED OF $35 AND INSURANCE WAS BILLED TO [employer] FOR THE ORIGINAL ORDER DATE

## 2014-08-08 DIAGNOSIS — R748 Abnormal levels of other serum enzymes: Secondary | ICD-10-CM | POA: Diagnosis not present

## 2014-08-11 ENCOUNTER — Other Ambulatory Visit: Payer: Self-pay | Admitting: Internal Medicine

## 2014-08-11 DIAGNOSIS — R748 Abnormal levels of other serum enzymes: Secondary | ICD-10-CM

## 2014-08-18 ENCOUNTER — Ambulatory Visit
Admission: RE | Admit: 2014-08-18 | Discharge: 2014-08-18 | Disposition: A | Discharge status: Home / Self Care | Payer: BLUE CROSS/BLUE SHIELD | Source: Ambulatory Visit | Attending: Internal Medicine | Admitting: Internal Medicine

## 2014-08-18 DIAGNOSIS — R748 Abnormal levels of other serum enzymes: Secondary | ICD-10-CM

## 2014-10-06 DIAGNOSIS — E11359 Type 2 diabetes mellitus with proliferative diabetic retinopathy without macular edema: Secondary | ICD-10-CM | POA: Diagnosis not present

## 2014-10-06 DIAGNOSIS — Z961 Presence of intraocular lens: Secondary | ICD-10-CM | POA: Diagnosis not present

## 2014-11-22 ENCOUNTER — Emergency Department (HOSPITAL_COMMUNITY)
Admission: EM | Admit: 2014-11-22 | Discharge: 2014-11-22 | Disposition: A | Discharge status: Home / Self Care | Payer: BLUE CROSS/BLUE SHIELD | Attending: Emergency Medicine | Admitting: Emergency Medicine

## 2014-11-22 ENCOUNTER — Emergency Department (HOSPITAL_COMMUNITY): Payer: BLUE CROSS/BLUE SHIELD

## 2014-11-22 ENCOUNTER — Encounter (HOSPITAL_COMMUNITY): Payer: Self-pay | Admitting: *Deleted

## 2014-11-22 DIAGNOSIS — S20211A Contusion of right front wall of thorax, initial encounter: Secondary | ICD-10-CM

## 2014-11-22 DIAGNOSIS — R109 Unspecified abdominal pain: Secondary | ICD-10-CM

## 2014-11-22 DIAGNOSIS — S79911A Unspecified injury of right hip, initial encounter: Secondary | ICD-10-CM | POA: Insufficient documentation

## 2014-11-22 DIAGNOSIS — W1839XA Other fall on same level, initial encounter: Secondary | ICD-10-CM | POA: Diagnosis not present

## 2014-11-22 DIAGNOSIS — S31609A Unspecified open wound of abdominal wall, unspecified quadrant with penetration into peritoneal cavity, initial encounter: Secondary | ICD-10-CM | POA: Diagnosis not present

## 2014-11-22 DIAGNOSIS — Z8701 Personal history of pneumonia (recurrent): Secondary | ICD-10-CM | POA: Insufficient documentation

## 2014-11-22 DIAGNOSIS — Y998 Other external cause status: Secondary | ICD-10-CM | POA: Diagnosis not present

## 2014-11-22 DIAGNOSIS — N189 Chronic kidney disease, unspecified: Secondary | ICD-10-CM | POA: Diagnosis not present

## 2014-11-22 DIAGNOSIS — Y9389 Activity, other specified: Secondary | ICD-10-CM | POA: Insufficient documentation

## 2014-11-22 DIAGNOSIS — Y92009 Unspecified place in unspecified non-institutional (private) residence as the place of occurrence of the external cause: Secondary | ICD-10-CM | POA: Diagnosis not present

## 2014-11-22 DIAGNOSIS — Z79899 Other long term (current) drug therapy: Secondary | ICD-10-CM | POA: Insufficient documentation

## 2014-11-22 DIAGNOSIS — S3991XA Unspecified injury of abdomen, initial encounter: Secondary | ICD-10-CM | POA: Diagnosis not present

## 2014-11-22 DIAGNOSIS — E119 Type 2 diabetes mellitus without complications: Secondary | ICD-10-CM | POA: Insufficient documentation

## 2014-11-22 DIAGNOSIS — Z7982 Long term (current) use of aspirin: Secondary | ICD-10-CM | POA: Diagnosis not present

## 2014-11-22 DIAGNOSIS — R1011 Right upper quadrant pain: Secondary | ICD-10-CM | POA: Diagnosis not present

## 2014-11-22 DIAGNOSIS — S29001A Unspecified injury of muscle and tendon of front wall of thorax, initial encounter: Secondary | ICD-10-CM | POA: Diagnosis present

## 2014-11-22 DIAGNOSIS — E785 Hyperlipidemia, unspecified: Secondary | ICD-10-CM | POA: Insufficient documentation

## 2014-11-22 DIAGNOSIS — I129 Hypertensive chronic kidney disease with stage 1 through stage 4 chronic kidney disease, or unspecified chronic kidney disease: Secondary | ICD-10-CM | POA: Diagnosis not present

## 2014-11-22 DIAGNOSIS — W19XXXA Unspecified fall, initial encounter: Secondary | ICD-10-CM

## 2014-11-22 LAB — CBG MONITORING, ED: GLUCOSE-CAPILLARY: 89 mg/dL (ref 65–99)

## 2014-11-22 MED ORDER — OXYCODONE HCL 5 MG PO TABS: 5.0000 mg | ORAL_TABLET | Freq: Four times a day (QID) | ORAL | Status: DC | PRN

## 2014-11-22 MED ORDER — HYDROCODONE-ACETAMINOPHEN 5-325 MG PO TABS
1.0000 | ORAL_TABLET | Freq: Once | ORAL | Status: AC
  Administered 2014-11-22: 1 via ORAL
  Filled 2014-11-22: qty 1

## 2014-11-22 NOTE — ED Notes (Signed)
Pt presents via [facility] for right hip pain.  Pt reports getting his feet tangled 3 days ago and falling onto right hip, denies LOC.   No deformity or crepitus noted to hip, bruising noted.  Hip tender to palpation and with movement.  BP-160/90 P-68 R-18, pt a x 4, NAD.

## 2014-11-22 NOTE — ED Provider Notes (Signed)
CSN: [id number]     Arrival date & time 11/22/14  1437 History   First MD Initiated Contact with Patient 11/22/14 1501     Chief Complaint  Patient presents with  . Hip Pain   (Consider location/radiation/quality/duration/timing/severity/associated sxs/prior Treatment) Patient is a 81 y.o. male presenting with fall. The history is provided by the patient.  Fall This is a new problem. Episode onset: 2 days ago. The problem occurs constantly. The problem has been unchanged. Associated symptoms include abdominal pain (RUQ) and chest pain (Right lower lateral ribs). Pertinent negatives include no anorexia, change in bowel habit, congestion, coughing, diaphoresis, fatigue, fever, headaches, joint swelling, myalgias, nausea, rash, urinary symptoms, vomiting or weakness. The symptoms are aggravated by bending, coughing, standing, stress and walking. He has tried nothing for the symptoms.    Past Medical History  Diagnosis Date  . Hypertension   . Type II diabetes mellitus   . Pneumonia 2007  . Cervical disc disease   . Dyslipidemia   . CKD (chronic kidney disease)    Past Surgical History  Procedure Laterality Date  . Eye surgery  1930's    "don't know what for" (05/11/2013)  . Cholecystectomy    . Cervical disc surgery      "i've had 3 neck ORs; not sure what kind; all thru the back of my neck"  . Posterior fusion cervical spine    . Knee arthroscopy      "had one scope; one open knee OR; not sure which on which side" (05/10/2013)  . Knee arthroplasty      "had one scope; one open knee OR; not sure which on which side" (05/10/2013)  . Rotator cuff repair Bilateral   . Joint replacement      bilateral knees, elbows, and shoulders (on 05/11/2013 pt denies all joint replacements"   . Cataract extraction w/ intraocular lens  implant, bilateral Bilateral    Family History  Problem Relation Age of Onset  . Heart disease Neg Hx     He does not know his father's history.   . Cancer Neg Hx    . Diabetes Neg Hx    History  Substance Use Topics  . Smoking status: Never Smoker   . Smokeless tobacco: Never Used  . Alcohol Use: No    Review of Systems  Constitutional: Negative for fever, diaphoresis and fatigue.  HENT: Negative for congestion.   Respiratory: Negative for cough, chest tightness and shortness of breath.   Cardiovascular: Positive for chest pain (Right lower lateral ribs).  Gastrointestinal: Positive for abdominal pain (RUQ). Negative for nausea, vomiting, anorexia and change in bowel habit.  Musculoskeletal: Positive for gait problem (2/2 right chest wall pain). Negative for myalgias and joint swelling.  Skin: Negative for rash.  Neurological: Negative for weakness, light-headedness and headaches.  Psychiatric/Behavioral: Negative for confusion.  All other systems reviewed and are negative.     Allergies  Nifedipine  Home Medications   Prior to Admission medications   Medication Sig Start Date End Date Taking? Authorizing Provider  aspirin 325 MG tablet Take 325 mg by mouth daily.    Historical Provider, MD  atorvastatin (LIPITOR) 80 MG tablet Take 40 mg by mouth daily. 12/27/13   Historical Provider, MD  Cholecalciferol (VITAMIN D-3) 1000 UNITS CAPS Take 1 capsule by mouth daily.    Historical Provider, MD  fosinopril (MONOPRIL) 40 MG tablet Take 40 mg by mouth daily.  03/06/13   Historical Provider, MD  furosemide (LASIX) 40 MG  tablet Take 40 mg by mouth daily.  02/26/13   Historical Provider, MD  hydrALAZINE (APRESOLINE) 10 MG tablet Take 10 mg by mouth daily.  03/16/13   Historical Provider, MD  LEVEMIR 100 UNIT/ML injection  04/26/13   Historical Provider, MD  metoprolol (LOPRESSOR) 50 MG tablet Take 50 mg by mouth 2 (two) times daily.    Historical Provider, MD  Multiple Vitamin (MULTIVITAMIN WITH MINERALS) TABS tablet Take 1 tablet by mouth daily.    Historical Provider, MD  omeprazole (PRILOSEC) 20 MG capsule Take 20 mg by mouth daily.  02/26/13    Historical Provider, MD  predniSONE (STERAPRED UNI-PAK) 10 MG tablet Take by mouth daily. Take 2 tablets daily for 2 days then one tablet daily for 3 days then stop 05/17/13   Seward Carol, MD  vitamin B-12 (CYANOCOBALAMIN) 1000 MCG tablet Take 1,000 mcg by mouth daily.    Historical Provider, MD   BP 151/40 mmHg  Pulse 60  Temp(Src) 98 F (36.7 C) (Oral)  Resp 22  Ht 5\' 8"  (1.727 m)  Wt 194 lb (87.998 kg)  BMI 29.50 kg/m2  SpO2 97% Physical Exam  Constitutional: He is oriented to person, place, and time. He appears well-developed and well-nourished. No distress.  HENT:  Head: Normocephalic and atraumatic.  Nose: Nose normal.  Mouth/Throat: Oropharynx is clear and moist. No oropharyngeal exudate.  Eyes: EOM are normal. Pupils are equal, round, and reactive to light.  Neck: Normal range of motion. Neck supple.  Cardiovascular: Normal rate, regular rhythm, normal heart sounds and intact distal pulses.   No murmur heard. Pulmonary/Chest: Effort normal and breath sounds normal. No respiratory distress. He has no wheezes. He exhibits no tenderness.    Abdominal: Soft. He exhibits no distension. There is tenderness (lateral RUQ). There is no guarding.  Soft, nondistended.  Pain at RUQ when palpating any quadrant  Musculoskeletal: Normal range of motion. He exhibits no tenderness.  Full ROM of bilateral hips.  Pain at RUQ/chest when ranging right hip. No pelvis instability.    Neurological: He is alert and oriented to person, place, and time. No cranial nerve deficit. Coordination normal.  Skin: Skin is warm and dry. He is not diaphoretic. No pallor.  Psychiatric: He has a normal mood and affect. His behavior is normal. Judgment and thought content normal.  Nursing note and vitals reviewed.   ED Course  Procedures (including critical care time) Labs Review Labs Reviewed - No data to display  Imaging Review Dg Chest 1 View  11/22/2014   CLINICAL DATA:  Status post fall 3 days ago.  Right hip pain. Initial encounter.  EXAM: CHEST  1 VIEW  COMPARISON:  PA and lateral chest 03/01/2014.  FINDINGS: The lungs are clear. Heart size is normal. No pneumothorax or pleural effusion is identified. No focal bony abnormality is seen. The patient is status post cervical fusion.  IMPRESSION: No acute disease.   Electronically Signed   By: Inge Rise M.D.   On: 11/22/2014 15:58   Dg Ribs Unilateral W/chest Right  11/22/2014   CLINICAL DATA:  Right rib pain, fall 3 days ago  EXAM: RIGHT RIBS AND CHEST - 3+ VIEW  COMPARISON:  Chest x-ray same day  FINDINGS: Four views right ribs submitted. The study is limited by patient's large body habitus. No rib fracture is identified. There is no pneumothorax. Surgical clips are noted in right upper quadrant of the abdomen.  IMPRESSION: Negative.   Electronically Signed   By: Julien Girt  Pop M.D.   On: 11/22/2014 17:43   Dg Hip Unilat With Pelvis 2-3 Views Right  11/22/2014   CLINICAL DATA:  [place] at home 3 days ago onto hardwood floor, RIGHT pelvic and RIGHT hip pain  EXAM: RIGHT HIP (WITH PELVIS) 2-3 VIEWS  COMPARISON:  None; correlation CT abdomen and pelvis 12/24/2004  FINDINGS: Diffuse osseous demineralization.  Hip and SI joint spaces symmetric and preserved.  No acute fracture, dislocation or bone destruction.  Slight irregularity lateral margin of the RIGHT iliac wing is unchanged since 2006 CT.  Few pelvic phleboliths.  IMPRESSION: No acute osseous abnormalities.  Bony demineralization.   Electronically Signed   By: Lavonia Dana M.D.   On: 11/22/2014 15:59     EKG Interpretation None      MDM   Final diagnoses:  Fall, initial encounter  Right flank pain  Rib contusion, right, initial encounter   Pt is a 81 yo M with hx of HTN, DM, and CKD who presents with right flank pain after a fall 2 days ago. Was walking to the bathroom and feet got tripped up on some cords on the ground, causing him to fall.  Landed on his right chest wall/flank.  Has had  pain since but stayed home yesterday thinking it may improve.  Pain with inspiration, movement, and cough.  Is able to ambulate and bear weight but significant RUQ pain when changing positions.   Tender to palpation of right lower lateral rib cage/RUQ.  Abd is soft, nondistended.  Has pain to RUQ when palpating any other quadrant.  Pelvis is stable, can range bilateral hips.  Distal lower extremities NVI.  Chest wall stable but ++ tender to right lateral chest.   POCUS FAST negative for blood in abdomen.  CXR and right hip xray ordered.  Given roxicodone and discussed deep inspiration to avoid pneumonia.  Incentive spirometry ordered.   Xrays returned negative for any acute bony deformities.  Patient was given additional pain meds and ambulated in the ED.  Encouraged the importance of good pain control to encourage deep inspiration in this patient with a clinical rib contusion.  Advised close follow up with PCP.  Encouraged scheduled tylenol and given an Rx for roxicodone q6 PRN.  All questions were answered and ED return precautions discussed.   If performed, labs, EKGs, and imaging were reviewed and interpreted by myself and my attending, and incorporated in the medical decision making.  Patient was seen with ED Attending, Dr. Gilford Rile, MD      Tori Milks, MD 11/23/14 [place], MD 11/23/14 1340

## 2014-11-22 NOTE — ED Notes (Signed)
Pt 94%-99% on RA while ambulatory. Reviewed incentive spirometer with pt. Return demonstration completed.

## 2015-02-17 DIAGNOSIS — N183 Chronic kidney disease, stage 3 (moderate): Secondary | ICD-10-CM | POA: Diagnosis not present

## 2015-02-17 DIAGNOSIS — Z23 Encounter for immunization: Secondary | ICD-10-CM | POA: Diagnosis not present

## 2015-02-17 DIAGNOSIS — E11351 Type 2 diabetes mellitus with proliferative diabetic retinopathy with macular edema: Secondary | ICD-10-CM | POA: Diagnosis not present

## 2015-02-17 DIAGNOSIS — I129 Hypertensive chronic kidney disease with stage 1 through stage 4 chronic kidney disease, or unspecified chronic kidney disease: Secondary | ICD-10-CM | POA: Diagnosis not present

## 2015-02-17 DIAGNOSIS — E782 Mixed hyperlipidemia: Secondary | ICD-10-CM | POA: Diagnosis not present

## 2015-02-17 DIAGNOSIS — K219 Gastro-esophageal reflux disease without esophagitis: Secondary | ICD-10-CM | POA: Diagnosis not present

## 2015-02-17 DIAGNOSIS — Z6832 Body mass index (BMI) 32.0-32.9, adult: Secondary | ICD-10-CM | POA: Diagnosis not present

## 2015-02-17 DIAGNOSIS — R748 Abnormal levels of other serum enzymes: Secondary | ICD-10-CM | POA: Diagnosis not present

## 2015-02-17 DIAGNOSIS — E1139 Type 2 diabetes mellitus with other diabetic ophthalmic complication: Secondary | ICD-10-CM | POA: Diagnosis not present

## 2015-02-17 DIAGNOSIS — E669 Obesity, unspecified: Secondary | ICD-10-CM | POA: Diagnosis not present

## 2015-02-28 ENCOUNTER — Inpatient Hospital Stay (HOSPITAL_COMMUNITY)
Admission: EM | Admit: 2015-02-28 | Discharge: 2015-03-07 | DRG: 313 | Disposition: A | Discharge status: Home Health | Payer: BLUE CROSS/BLUE SHIELD | Attending: Family Medicine | Admitting: Family Medicine

## 2015-02-28 ENCOUNTER — Encounter (HOSPITAL_COMMUNITY): Payer: Self-pay | Admitting: Emergency Medicine

## 2015-02-28 ENCOUNTER — Emergency Department (HOSPITAL_COMMUNITY): Payer: BLUE CROSS/BLUE SHIELD

## 2015-02-28 DIAGNOSIS — I10 Essential (primary) hypertension: Secondary | ICD-10-CM | POA: Diagnosis not present

## 2015-02-28 DIAGNOSIS — N189 Chronic kidney disease, unspecified: Secondary | ICD-10-CM | POA: Diagnosis not present

## 2015-02-28 DIAGNOSIS — E1122 Type 2 diabetes mellitus with diabetic chronic kidney disease: Secondary | ICD-10-CM | POA: Diagnosis present

## 2015-02-28 DIAGNOSIS — N179 Acute kidney failure, unspecified: Secondary | ICD-10-CM | POA: Diagnosis present

## 2015-02-28 DIAGNOSIS — K219 Gastro-esophageal reflux disease without esophagitis: Secondary | ICD-10-CM | POA: Diagnosis present

## 2015-02-28 DIAGNOSIS — R079 Chest pain, unspecified: Secondary | ICD-10-CM | POA: Diagnosis not present

## 2015-02-28 DIAGNOSIS — N184 Chronic kidney disease, stage 4 (severe): Secondary | ICD-10-CM | POA: Diagnosis present

## 2015-02-28 DIAGNOSIS — I5031 Acute diastolic (congestive) heart failure: Secondary | ICD-10-CM | POA: Insufficient documentation

## 2015-02-28 DIAGNOSIS — N141 Nephropathy induced by other drugs, medicaments and biological substances: Secondary | ICD-10-CM | POA: Diagnosis not present

## 2015-02-28 DIAGNOSIS — I129 Hypertensive chronic kidney disease with stage 1 through stage 4 chronic kidney disease, or unspecified chronic kidney disease: Secondary | ICD-10-CM | POA: Diagnosis present

## 2015-02-28 DIAGNOSIS — E875 Hyperkalemia: Secondary | ICD-10-CM | POA: Diagnosis not present

## 2015-02-28 DIAGNOSIS — J9811 Atelectasis: Secondary | ICD-10-CM | POA: Diagnosis present

## 2015-02-28 DIAGNOSIS — R0781 Pleurodynia: Secondary | ICD-10-CM | POA: Insufficient documentation

## 2015-02-28 DIAGNOSIS — E785 Hyperlipidemia, unspecified: Secondary | ICD-10-CM | POA: Diagnosis present

## 2015-02-28 DIAGNOSIS — I959 Hypotension, unspecified: Secondary | ICD-10-CM | POA: Diagnosis present

## 2015-02-28 DIAGNOSIS — I480 Paroxysmal atrial fibrillation: Secondary | ICD-10-CM | POA: Diagnosis not present

## 2015-02-28 DIAGNOSIS — I509 Heart failure, unspecified: Secondary | ICD-10-CM

## 2015-02-28 DIAGNOSIS — T508X5A Adverse effect of diagnostic agents, initial encounter: Secondary | ICD-10-CM | POA: Diagnosis not present

## 2015-02-28 DIAGNOSIS — R0789 Other chest pain: Principal | ICD-10-CM | POA: Diagnosis present

## 2015-02-28 DIAGNOSIS — I4891 Unspecified atrial fibrillation: Secondary | ICD-10-CM

## 2015-02-28 DIAGNOSIS — I2510 Atherosclerotic heart disease of native coronary artery without angina pectoris: Secondary | ICD-10-CM | POA: Diagnosis present

## 2015-02-28 DIAGNOSIS — Z7982 Long term (current) use of aspirin: Secondary | ICD-10-CM

## 2015-02-28 DIAGNOSIS — N170 Acute kidney failure with tubular necrosis: Secondary | ICD-10-CM | POA: Diagnosis not present

## 2015-02-28 LAB — I-STAT TROPONIN, ED
Troponin i, poc: 0.01 ng/mL (ref 0.00–0.08)
Troponin i, poc: 0.02 ng/mL (ref 0.00–0.08)

## 2015-02-28 LAB — BASIC METABOLIC PANEL
Anion gap: 6 (ref 5–15)
BUN: 29 mg/dL — AB (ref 6–20)
CALCIUM: 9.6 mg/dL (ref 8.9–10.3)
CO2: 30 mmol/L (ref 22–32)
CREATININE: 1.61 mg/dL — AB (ref 0.61–1.24)
Chloride: 104 mmol/L (ref 101–111)
GFR calc Af Amer: 45 mL/min — ABNORMAL LOW (ref >60)
GFR calc non Af Amer: 38 mL/min — ABNORMAL LOW (ref >60)
GLUCOSE: 100 mg/dL — AB (ref 65–99)
Potassium: 4.6 mmol/L (ref 3.5–5.1)
Sodium: 140 mmol/L (ref 135–145)

## 2015-02-28 LAB — CBC
HCT: 45 % (ref 39.0–52.0)
Hemoglobin: 14.7 g/dL (ref 13.0–17.0)
MCH: 31.7 pg (ref 26.0–34.0)
MCHC: 32.7 g/dL (ref 30.0–36.0)
MCV: 97 fL (ref 78.0–100.0)
Platelets: 217 10*3/uL (ref 150–400)
RBC: 4.64 MIL/uL (ref 4.22–5.81)
RDW: 14.1 % (ref 11.5–15.5)
WBC: 16.1 10*3/uL — ABNORMAL HIGH (ref 4.0–10.5)

## 2015-02-28 LAB — D-DIMER, QUANTITATIVE (NOT AT ARMC): D DIMER QUANT: 0.86 ug{FEU}/mL — AB (ref 0.00–0.48)

## 2015-02-28 LAB — CBG MONITORING, ED: Glucose-Capillary: 78 mg/dL (ref 65–99)

## 2015-02-28 MED ORDER — NITROGLYCERIN 0.4 MG SL SUBL
0.4000 mg | SUBLINGUAL_TABLET | SUBLINGUAL | Status: AC | PRN
  Administered 2015-02-28 (×3): 0.4 mg via SUBLINGUAL

## 2015-02-28 MED ORDER — SODIUM CHLORIDE 0.9 % IV BOLUS (SEPSIS)
1000.0000 mL | Freq: Once | INTRAVENOUS | Status: AC
  Administered 2015-02-28: 1000 mL via INTRAVENOUS

## 2015-02-28 MED ORDER — ASPIRIN 81 MG PO CHEW: 324.0000 mg | CHEWABLE_TABLET | Freq: Once | ORAL | Status: DC

## 2015-02-28 MED ORDER — MORPHINE SULFATE (PF) 4 MG/ML IV SOLN
4.0000 mg | Freq: Once | INTRAVENOUS | Status: AC
Start: 2015-02-28 — End: 2015-02-28
  Administered 2015-02-28: 4 mg via INTRAVENOUS
  Filled 2015-02-28: qty 1

## 2015-02-28 MED ORDER — ALBUTEROL SULFATE (2.5 MG/3ML) 0.083% IN NEBU
5.0000 mg | INHALATION_SOLUTION | Freq: Once | RESPIRATORY_TRACT | Status: AC
  Administered 2015-02-28: 5 mg via RESPIRATORY_TRACT
  Filled 2015-02-28: qty 6

## 2015-02-28 MED ORDER — LIDOCAINE VISCOUS 2 % MT SOLN
15.0000 mL | Freq: Once | OROMUCOSAL | Status: AC
  Administered 2015-02-28: 15 mL via OROMUCOSAL
  Filled 2015-02-28: qty 15

## 2015-02-28 MED ORDER — IOHEXOL 350 MG/ML SOLN
80.0000 mL | Freq: Once | INTRAVENOUS | Status: AC | PRN
  Administered 2015-02-28: 80 mL via INTRAVENOUS

## 2015-02-28 MED ORDER — MORPHINE SULFATE (PF) 2 MG/ML IV SOLN
2.0000 mg | Freq: Once | INTRAVENOUS | Status: AC
  Administered 2015-02-28: 2 mg via INTRAVENOUS
  Filled 2015-02-28: qty 1

## 2015-02-28 MED ORDER — ALUM & MAG HYDROXIDE-SIMETH 200-200-20 MG/5ML PO SUSP
15.0000 mL | Freq: Once | ORAL | Status: AC
  Administered 2015-02-28: 15 mL via ORAL
  Filled 2015-02-28: qty 30

## 2015-02-28 MED ORDER — FENTANYL CITRATE (PF) 100 MCG/2ML IJ SOLN
50.0000 ug | Freq: Once | INTRAMUSCULAR | Status: AC
  Administered 2015-02-28: 50 ug via INTRAVENOUS
  Filled 2015-02-28: qty 2

## 2015-02-28 NOTE — ED Provider Notes (Signed)
CSN: [id number]     Arrival date & time 02/28/15  1334 History   First MD Initiated Contact with Patient 02/28/15 1338     Chief Complaint  Patient presents with  . Chest Pain     (Consider location/radiation/quality/duration/timing/severity/associated sxs/prior Treatment) Patient is a 81 y.o. male presenting with chest pain.  Chest Pain Pain location:  L chest Pain quality: sharp and shooting   Pain radiates to:  Does not radiate Pain radiates to the back: no   Pain severity:  Moderate Onset quality:  Sudden Duration:  2 days Timing:  Constant Progression:  Worsening Chronicity:  New Relieved by:  Nothing Worsened by:  Nothing tried Ineffective treatments:  None tried Associated symptoms: no abdominal pain, no fever, no headache, no palpitations, no shortness of breath and not vomiting   Risk factors: diabetes mellitus, high cholesterol and hypertension   Risk factors: no coronary artery disease    81 yo M with a chief complaint chest pain. This been going on for a couple days. Patient has had this pain for many years usually goes away after a short time. Today the pain has persisted for over 3 hours. Sharp shooting worse with deep inspiration. Patient denies shortness breath cough congestion fevers chills. Patient denies any radiation of pain. Patient denies any abdominal pain. Patient denies injury.  Past Medical History  Diagnosis Date  . Hypertension   . Type II diabetes mellitus ([place])   . Pneumonia 2007  . Cervical disc disease   . Dyslipidemia   . CKD (chronic kidney disease)    Past Surgical History  Procedure Laterality Date  . Eye surgery  1930's    "don't know what for" (05/11/2013)  . Cholecystectomy    . Cervical disc surgery      "i've had 3 neck ORs; not sure what kind; all thru the back of my neck"  . Posterior fusion cervical spine    . Knee arthroscopy      "had one scope; one open knee OR; not sure which on which side" (05/10/2013)  . Knee  arthroplasty      "had one scope; one open knee OR; not sure which on which side" (05/10/2013)  . Rotator cuff repair Bilateral   . Joint replacement      bilateral knees, elbows, and shoulders (on 05/11/2013 pt denies all joint replacements"   . Cataract extraction w/ intraocular lens  implant, bilateral Bilateral    Family History  Problem Relation Age of Onset  . Heart disease Neg Hx     He does not know his father's history.   . Cancer Neg Hx   . Diabetes Neg Hx   . Diabetes Mellitus II Paternal Grandmother    Social History  Substance Use Topics  . Smoking status: Never Smoker   . Smokeless tobacco: Former Systems developer    Types: Chew  . Alcohol Use: No    Review of Systems  Constitutional: Negative for fever and chills.  HENT: Negative for congestion and facial swelling.   Eyes: Negative for discharge and visual disturbance.  Respiratory: Negative for shortness of breath.   Cardiovascular: Positive for chest pain and leg swelling. Negative for palpitations.  Gastrointestinal: Negative for vomiting, abdominal pain and diarrhea.  Musculoskeletal: Negative for myalgias and arthralgias.  Skin: Negative for color change and rash.  Neurological: Negative for tremors, syncope and headaches.  Psychiatric/Behavioral: Negative for confusion and dysphoric mood.      Allergies  Nifedipine  Home Medications  Prior to Admission medications   Medication Sig Start Date End Date Taking? Authorizing Provider  aspirin 325 MG tablet Take 325 mg by mouth 2 (two) times daily.    Yes Historical Provider, MD  atorvastatin (LIPITOR) 80 MG tablet Take 40 mg by mouth daily. 12/27/13  Yes Historical Provider, MD  Cholecalciferol (VITAMIN D-3) 1000 UNITS CAPS Take 1 capsule by mouth daily.   Yes Historical Provider, MD  fosinopril (MONOPRIL) 40 MG tablet Take 40 mg by mouth daily.  03/06/13  Yes Historical Provider, MD  furosemide (LASIX) 40 MG tablet Take 40 mg by mouth daily.  02/26/13  Yes  Historical Provider, MD  hydrALAZINE (APRESOLINE) 10 MG tablet Take 10 mg by mouth daily.  03/16/13  Yes Historical Provider, MD  insulin lispro (HUMALOG) 100 UNIT/ML injection Inject 10-30 Units into the skin 3 (three) times daily before meals.   Yes Historical Provider, MD  metoprolol (LOPRESSOR) 50 MG tablet Take 50 mg by mouth 2 (two) times daily.   Yes Historical Provider, MD  Multiple Vitamin (MULTIVITAMIN WITH MINERALS) TABS tablet Take 1 tablet by mouth daily.   Yes Historical Provider, MD  omeprazole (PRILOSEC) 20 MG capsule Take 20 mg by mouth daily.  02/26/13  Yes Historical Provider, MD  vitamin B-12 (CYANOCOBALAMIN) 1000 MCG tablet Take 1,000 mcg by mouth daily.   Yes Historical Provider, MD   BP 116/37 mmHg  Pulse 60  Temp(Src) 97.6 F (36.4 C) (Oral)  Resp 22  Ht 5\' 3"  (1.6 m)  Wt 188 lb (85.276 kg)  BMI 33.31 kg/m2  SpO2 98% Physical Exam  Constitutional: He is oriented to person, place, and time. He appears well-developed and well-nourished.  HENT:  Head: Normocephalic and atraumatic.  Eyes: EOM are normal. Pupils are equal, round, and reactive to light.  Neck: Normal range of motion. Neck supple. No JVD present.  Cardiovascular: Normal rate and regular rhythm.  Exam reveals no gallop and no friction rub.   No murmur heard. Pulmonary/Chest: No respiratory distress. He has no wheezes. He exhibits tenderness (TTP about the left chest wall).  Abdominal: He exhibits no distension. There is no tenderness. There is no rebound and no guarding.  Musculoskeletal: Normal range of motion. He exhibits edema.  Bilateral lower extremity edema 2+ up to the knees.  Neurological: He is alert and oriented to person, place, and time.  Skin: No rash noted. No pallor.  Psychiatric: He has a normal mood and affect. His behavior is normal.    ED Course  Procedures (including critical care time) Labs Review Labs Reviewed  CBC - Abnormal; Notable for the following:    WBC 16.1 (*)     All other components within normal limits  BASIC METABOLIC PANEL - Abnormal; Notable for the following:    Glucose, Bld 100 (*)    BUN 29 (*)    Creatinine, Ser 1.61 (*)    GFR calc non Af Amer 38 (*)    GFR calc Af Amer 45 (*)    All other components within normal limits  D-DIMER, QUANTITATIVE (NOT AT [facility]) - Abnormal; Notable for the following:    D-Dimer, Quant 0.86 (*)    All other components within normal limits  TROPONIN I  TROPONIN I  TROPONIN I  I-STAT TROPOININ, ED  I-STAT TROPOININ, ED  CBG MONITORING, ED    Imaging Review Dg Chest 2 View  02/28/2015  CLINICAL DATA:  Chest pain. EXAM: CHEST  2 VIEW COMPARISON:  November 22, 2014.  March 01, 2014. FINDINGS: The heart size and mediastinal contours are within normal limits. No pneumothorax or pleural effusion is noted. Right lung is clear. Increased left basilar opacity is noted concerning for subsegmental atelectasis. Stable compression deformity of L1 vertebral body is noted consistent with old fracture. IMPRESSION: Mild left basilar subsegmental atelectasis. No other significant abnormality seen in the chest. Electronically Signed   By: Marijo Conception, M.D.   On: 02/28/2015 14:48   Ct Angio Chest Pe W/cm &/or Wo Cm  02/28/2015  CLINICAL DATA:  Mid chest pain and shortness of breath. EXAM: CT ANGIOGRAPHY CHEST WITH CONTRAST TECHNIQUE: Multidetector CT imaging of the chest was performed using the standard protocol during bolus administration of intravenous contrast. Multiplanar CT image reconstructions and MIPs were obtained to evaluate the vascular anatomy. CONTRAST:  57mL OMNIPAQUE IOHEXOL 350 MG/ML SOLN COMPARISON:  Chest x-ray earlier today P FINDINGS: The pulmonary arteries are adequately opacified. There is no evidence of pulmonary embolism. The thoracic aorta is normal in caliber and shows no evidence of aneurysm or dissection. Mild atherosclerosis is noted of the aortic arch. There is a small amount of pericardial fluid. The  heart is mildly enlarged. Calcified plaque is noted in the distribution of the LAD. There also is calcified plaque at the origin of the right coronary artery. Lungs show evidence of significant scarring at both bases with scattered areas of subsegmental atelectasis. There may be a mild component of pulmonary venous hypertension without overt edema. No pleural fluid is identified. No focal airspace consolidation, pneumothorax or pulmonary nodule. No airway obstruction. No enlarged lymph nodes are seen. Visualized upper abdomen is unremarkable. Bony structures show mild spondylosis of the thoracic spine. Review of the MIP images confirms the above findings. IMPRESSION: 1. No evidence of pulmonary embolism. 2. Small pericardial effusion 3. Coronary atherosclerosis with calcified plaque in the distribution of the LAD and at the origin of the RCA. 4. Prominent scarring and atelectasis at both lung bases. 5. Suggestion of pulmonary venous hypertension without overt edema. The heart is mildly enlarged. Electronically Signed   By: Aletta Edouard M.D.   On: 02/28/2015 17:31   I have personally reviewed and evaluated these images and lab results as part of my medical decision-making.   EKG Interpretation   Date/Time:  Tuesday February 28 2015 13:35:21 EDT Ventricular Rate:  66 PR Interval:  256 QRS Duration: 81 QT Interval:  383 QTC Calculation: 401 R Axis:   -34 Text Interpretation:  Sinus rhythm Prolonged PR interval Left axis  deviation Baseline wander Confirmed by Samah Lapiana MD, DANIEL ([id number]) on  02/28/2015 1:47:45 PM      MDM   Final diagnoses:  Chest pain, unspecified chest pain type    81 yo M with a chief complaint of chest pain. Reproducible on palpation on exam. Patient without any PE risk factors. Will obtain a d-dimer. Delta troponin. Patient having some significant pain with tachypnea. Given 6 mg of morphine with mild improvement. Patient with elevated d-dimer. We will CT scan to rule out  PE.   Turned over to Dr. Doy Mince.  Likely admit for uncontrolled chest pain.     Deno Etienne, DO 03/01/15 [phone]

## 2015-02-28 NOTE — ED Notes (Signed)
Pt arrives via EMS from home with substernal chest pain with inspiration. Pt reports pain began at 1130 this AM. Has been constant ever since. 20g left hand. 324mg  ASA PTA.

## 2015-02-28 NOTE — ED Notes (Signed)
Spoke with CT, pt is next for pick up. MD at bedside.

## 2015-02-28 NOTE — H&P (Signed)
[facility] History and Physical  Justin Leach BLT:[id number] DOB: [birth year] DOA: 02/28/2015  Referring physician: Dr. Doy Mince. PCP: Irven Shelling, MD  Specialists: None.  Chief Complaint: Chest pain.  HPI: Justin Leach is a 81 y.o. male with history of diabetes mellitus, hypertension, chronic kidney disease and hyperlipidemia presents to the ER because of chest pain. Patient has been having chest pains and morning 10 AM. Pain is retrosternal and patient is not able to exactly characterize the pain. Pain increases on deep breathing denies any associated cough or fever chills but has shortness of breath. Pain is present even at rest. Since pain is persistent patient came to the ER. CT angiogram of the chest shows mild pericardial effusion otherwise negative for PE. EKG was showing nonspecific findings and cardiac markers were negative. Patient's lab work also shows leukocytosis. On exam patient chest pain is also partially reproducible on deep palpation. Patient is afebrile. Patient is admitted for further management of chest pain with mild shortness of breath. Patient has had a negative stress test last November 2015.  Review of Systems: As presented in the history of presenting illness, rest negative.  Past Medical History  Diagnosis Date  . Hypertension   . Type II diabetes mellitus ([place])   . Pneumonia 2007  . Cervical disc disease   . Dyslipidemia   . CKD (chronic kidney disease)    Past Surgical History  Procedure Laterality Date  . Eye surgery  1930's    "don't know what for" (05/11/2013)  . Cholecystectomy    . Cervical disc surgery      "i've had 3 neck ORs; not sure what kind; all thru the back of my neck"  . Posterior fusion cervical spine    . Knee arthroscopy      "had one scope; one open knee OR; not sure which on which side" (05/10/2013)  . Knee arthroplasty      "had one scope; one open knee OR; not sure which on which side" (05/10/2013)  .  Rotator cuff repair Bilateral   . Joint replacement      bilateral knees, elbows, and shoulders (on 05/11/2013 pt denies all joint replacements"   . Cataract extraction w/ intraocular lens  implant, bilateral Bilateral    Social History:  reports that he has never smoked. He has never used smokeless tobacco. He reports that he does not drink alcohol or use illicit drugs. Where does patient live at home. Can patient participate in ADLs? Yes.  Allergies  Allergen Reactions  . Nifedipine Other (See Comments)    Reaction: made gums swell up. Pt states that he had to have gum surgery after taking.     Family History:  Family History  Problem Relation Age of Onset  . Heart disease Neg Hx     He does not know his father's history.   . Cancer Neg Hx   . Diabetes Neg Hx   . Diabetes Mellitus II Paternal Grandmother       Prior to Admission medications   Medication Sig Start Date End Date Taking? Authorizing Provider  aspirin 325 MG tablet Take 325 mg by mouth 2 (two) times daily.    Yes Historical Provider, MD  atorvastatin (LIPITOR) 80 MG tablet Take 40 mg by mouth daily. 12/27/13  Yes Historical Provider, MD  Cholecalciferol (VITAMIN D-3) 1000 UNITS CAPS Take 1 capsule by mouth daily.   Yes Historical Provider, MD  fosinopril (MONOPRIL) 40 MG tablet Take 40 mg by mouth  daily.  03/06/13  Yes Historical Provider, MD  furosemide (LASIX) 40 MG tablet Take 40 mg by mouth daily.  02/26/13  Yes Historical Provider, MD  hydrALAZINE (APRESOLINE) 10 MG tablet Take 10 mg by mouth daily.  03/16/13  Yes Historical Provider, MD  insulin lispro (HUMALOG) 100 UNIT/ML injection Inject 10-30 Units into the skin 3 (three) times daily before meals.   Yes Historical Provider, MD  metoprolol (LOPRESSOR) 50 MG tablet Take 50 mg by mouth 2 (two) times daily.   Yes Historical Provider, MD  Multiple Vitamin (MULTIVITAMIN WITH MINERALS) TABS tablet Take 1 tablet by mouth daily.   Yes Historical Provider, MD   omeprazole (PRILOSEC) 20 MG capsule Take 20 mg by mouth daily.  02/26/13  Yes Historical Provider, MD  vitamin B-12 (CYANOCOBALAMIN) 1000 MCG tablet Take 1,000 mcg by mouth daily.   Yes Historical Provider, MD    Physical Exam: Filed Vitals:   02/28/15 1930 02/28/15 2015 02/28/15 2043 02/28/15 2050  BP: 162/46 140/48 145/48   Pulse: 78 79 78 78  Temp:      TempSrc:      Resp: 27 30 24 22   SpO2: 94% 95% 96% 94%     General:  Obese not in distress.  Eyes: Anicteric no pallor.  ENT: No discharge from the ears eyes nose and mouth.  Neck: No mass felt. No JVD appreciated.  Cardiovascular: S1-S2 heard.  Respiratory: No rhonchi or crepitations.  Abdomen: Soft nontender bowel sounds present.  Skin: No rash.  Musculoskeletal: No edema. Pain on deep palpation in the chest.  Psychiatric: Appears normal.  Neurologic: Alert awake oriented to time place and person. Moves all extremities.  Labs on Admission:  Basic Metabolic Panel:  Recent Labs Lab 02/28/15 1407  NA 140  K 4.6  CL 104  CO2 30  GLUCOSE 100*  BUN 29*  CREATININE 1.61*  CALCIUM 9.6   Liver Function Tests: No results for input(s): AST, ALT, ALKPHOS, BILITOT, PROT, ALBUMIN in the last 168 hours. No results for input(s): LIPASE, AMYLASE in the last 168 hours. No results for input(s): AMMONIA in the last 168 hours. CBC:  Recent Labs Lab 02/28/15 1407  WBC 16.1*  HGB 14.7  HCT 45.0  MCV 97.0  PLT 217   Cardiac Enzymes: No results for input(s): CKTOTAL, CKMB, CKMBINDEX, TROPONINI in the last 168 hours.  BNP (last 3 results) No results for input(s): BNP in the last 8760 hours.  ProBNP (last 3 results) No results for input(s): PROBNP in the last 8760 hours.  CBG:  Recent Labs Lab 02/28/15 1752  GLUCAP 78    Radiological Exams on Admission: Dg Chest 2 View  02/28/2015   CLINICAL DATA:  Chest pain.  EXAM: CHEST  2 VIEW  COMPARISON:  November 22, 2014.  March 01, 2014.  FINDINGS: The heart  size and mediastinal contours are within normal limits. No pneumothorax or pleural effusion is noted. Right lung is clear. Increased left basilar opacity is noted concerning for subsegmental atelectasis. Stable compression deformity of L1 vertebral body is noted consistent with old fracture.  IMPRESSION: Mild left basilar subsegmental atelectasis. No other significant abnormality seen in the chest.   Electronically Signed   By: Marijo Conception, M.D.   On: 02/28/2015 14:48   Ct Angio Chest Pe W/cm &/or Wo Cm  02/28/2015   CLINICAL DATA:  Mid chest pain and shortness of breath.  EXAM: CT ANGIOGRAPHY CHEST WITH CONTRAST  TECHNIQUE: Multidetector CT imaging of the chest  was performed using the standard protocol during bolus administration of intravenous contrast. Multiplanar CT image reconstructions and MIPs were obtained to evaluate the vascular anatomy.  CONTRAST:  46mL OMNIPAQUE IOHEXOL 350 MG/ML SOLN  COMPARISON:  Chest x-ray earlier today P  FINDINGS: The pulmonary arteries are adequately opacified. There is no evidence of pulmonary embolism. The thoracic aorta is normal in caliber and shows no evidence of aneurysm or dissection. Mild atherosclerosis is noted of the aortic arch.  There is a small amount of pericardial fluid. The heart is mildly enlarged. Calcified plaque is noted in the distribution of the LAD. There also is calcified plaque at the origin of the right coronary artery.  Lungs show evidence of significant scarring at both bases with scattered areas of subsegmental atelectasis. There may be a mild component of pulmonary venous hypertension without overt edema. No pleural fluid is identified. No focal airspace consolidation, pneumothorax or pulmonary nodule. No airway obstruction. No enlarged lymph nodes are seen. Visualized upper abdomen is unremarkable. Bony structures show mild spondylosis of the thoracic spine.  Review of the MIP images confirms the above findings.  IMPRESSION: 1. No evidence  of pulmonary embolism. 2. Small pericardial effusion 3. Coronary atherosclerosis with calcified plaque in the distribution of the LAD and at the origin of the RCA. 4. Prominent scarring and atelectasis at both lung bases. 5. Suggestion of pulmonary venous hypertension without overt edema. The heart is mildly enlarged.   Electronically Signed   By: Aletta Edouard M.D.   On: 02/28/2015 17:31    EKG: Independently reviewed. Normal sinus rhythm with prolonged PR interval with nonspecific findings.  Assessment/Plan Active Problems:   Hypertension   Chest pain   Renal failure (ARF), acute on chronic (HCC)   Hyperlipidemia   Diabetes mellitus type 2, controlled ([place])   1. Chest pain - has atypical and pleuritic component. CT angiogram of the chest shows mild pericardial effusion. Among the differentials include pericarditis. Patient's CAT scan also shows some coronary calcifications. Patient has had normal stress test last year in November. At this time we will cycle cardiac markers check 2-D echo specifically for any pericardial effusion and keep patient on pain medications aspirin when necessary nitroglycerin. On statins and the blockers. 2. Acute on chronic renal failure stage III - closely monitor metabolic panel. If there is any further worsening of creatinine we may have to hold Lasix and ace inhibitors. 3. Diabetes mellitus type 2 - on sliding scale coverage. 4. Hypertension - continue home medications. See #2 with regarding to renal failure. 5. Hyperlipidemia on statins.  I have reviewed patient's old charts and labs. Procedure patient's EKG and chest x-ray.   DVT Prophylaxis SCDs. Avoiding Lovenox until we make sure patient is not any worsening pericardial effusion.  Code Status: Full code.  Family Communication: Discussed with patient.  Disposition Plan: Admit for observation.    Cornelio Parkerson N. [facility] Pager [phone].  If 7PM-7AM, please contact  night-coverage [email or web address] Password [facility] 02/28/2015, 9:36 PM

## 2015-03-01 ENCOUNTER — Observation Stay (HOSPITAL_COMMUNITY): Payer: BLUE CROSS/BLUE SHIELD

## 2015-03-01 ENCOUNTER — Encounter (HOSPITAL_COMMUNITY): Payer: Self-pay | Admitting: *Deleted

## 2015-03-01 DIAGNOSIS — I4891 Unspecified atrial fibrillation: Secondary | ICD-10-CM | POA: Diagnosis not present

## 2015-03-01 DIAGNOSIS — R079 Chest pain, unspecified: Secondary | ICD-10-CM | POA: Diagnosis not present

## 2015-03-01 DIAGNOSIS — I10 Essential (primary) hypertension: Secondary | ICD-10-CM | POA: Diagnosis not present

## 2015-03-01 DIAGNOSIS — R0789 Other chest pain: Secondary | ICD-10-CM | POA: Diagnosis not present

## 2015-03-01 DIAGNOSIS — R072 Precordial pain: Secondary | ICD-10-CM | POA: Diagnosis not present

## 2015-03-01 DIAGNOSIS — N179 Acute kidney failure, unspecified: Secondary | ICD-10-CM | POA: Diagnosis not present

## 2015-03-01 DIAGNOSIS — E785 Hyperlipidemia, unspecified: Secondary | ICD-10-CM | POA: Diagnosis not present

## 2015-03-01 DIAGNOSIS — Z794 Long term (current) use of insulin: Secondary | ICD-10-CM | POA: Diagnosis not present

## 2015-03-01 DIAGNOSIS — E1122 Type 2 diabetes mellitus with diabetic chronic kidney disease: Secondary | ICD-10-CM | POA: Diagnosis not present

## 2015-03-01 DIAGNOSIS — E119 Type 2 diabetes mellitus without complications: Secondary | ICD-10-CM | POA: Diagnosis not present

## 2015-03-01 DIAGNOSIS — R0781 Pleurodynia: Secondary | ICD-10-CM | POA: Diagnosis not present

## 2015-03-01 DIAGNOSIS — I5031 Acute diastolic (congestive) heart failure: Secondary | ICD-10-CM | POA: Diagnosis not present

## 2015-03-01 DIAGNOSIS — R071 Chest pain on breathing: Secondary | ICD-10-CM | POA: Diagnosis not present

## 2015-03-01 LAB — CBC
HCT: 35.2 % — ABNORMAL LOW (ref 39.0–52.0)
Hemoglobin: 11.7 g/dL — ABNORMAL LOW (ref 13.0–17.0)
MCH: 32.1 pg (ref 26.0–34.0)
MCHC: 33.2 g/dL (ref 30.0–36.0)
MCV: 96.7 fL (ref 78.0–100.0)
Platelets: 146 10*3/uL — ABNORMAL LOW (ref 150–400)
RBC: 3.64 MIL/uL — ABNORMAL LOW (ref 4.22–5.81)
RDW: 14.6 % (ref 11.5–15.5)
WBC: 16.5 10*3/uL — ABNORMAL HIGH (ref 4.0–10.5)

## 2015-03-01 LAB — GLUCOSE, CAPILLARY
GLUCOSE-CAPILLARY: 183 mg/dL — AB (ref 65–99)
GLUCOSE-CAPILLARY: 216 mg/dL — AB (ref 65–99)
Glucose-Capillary: 169 mg/dL — ABNORMAL HIGH (ref 65–99)
Glucose-Capillary: 132 mg/dL — ABNORMAL HIGH (ref 65–99)

## 2015-03-01 LAB — BASIC METABOLIC PANEL
ANION GAP: 9 (ref 5–15)
Anion gap: 9 (ref 5–15)
BUN: 39 mg/dL — ABNORMAL HIGH (ref 6–20)
BUN: 42 mg/dL — ABNORMAL HIGH (ref 6–20)
CALCIUM: 8.5 mg/dL — AB (ref 8.9–10.3)
CO2: 27 mmol/L (ref 22–32)
CO2: 27 mmol/L (ref 22–32)
CREATININE: 2.57 mg/dL — AB (ref 0.61–1.24)
Calcium: 8.6 mg/dL — ABNORMAL LOW (ref 8.9–10.3)
Chloride: 98 mmol/L — ABNORMAL LOW (ref 101–111)
Chloride: 99 mmol/L — ABNORMAL LOW (ref 101–111)
Creatinine, Ser: 2.71 mg/dL — ABNORMAL HIGH (ref 0.61–1.24)
GFR calc Af Amer: 25 mL/min — ABNORMAL LOW (ref >60)
GFR calc Af Amer: 24 mL/min — ABNORMAL LOW (ref >60)
GFR, EST NON AFRICAN AMERICAN: 22 mL/min — AB (ref >60)
GFR, EST NON AFRICAN AMERICAN: 20 mL/min — AB (ref >60)
GLUCOSE: 201 mg/dL — AB (ref 65–99)
Glucose, Bld: 178 mg/dL — ABNORMAL HIGH (ref 65–99)
POTASSIUM: 4.9 mmol/L (ref 3.5–5.1)
Potassium: 5.3 mmol/L — ABNORMAL HIGH (ref 3.5–5.1)
Sodium: 134 mmol/L — ABNORMAL LOW (ref 135–145)
Sodium: 135 mmol/L (ref 135–145)

## 2015-03-01 LAB — TROPONIN I
Troponin I: <0.03 ng/mL (ref <0.031)
Troponin I: <0.03 ng/mL (ref <0.031)

## 2015-03-01 MED ORDER — VITAMIN B-12 1000 MCG PO TABS
1000.0000 ug | ORAL_TABLET | Freq: Every day | ORAL | Status: DC
  Administered 2015-03-01 – 2015-03-07 (×7): 1000 ug via ORAL
  Filled 2015-03-01 (×7): qty 1

## 2015-03-01 MED ORDER — METOPROLOL TARTRATE 50 MG PO TABS
50.0000 mg | ORAL_TABLET | Freq: Two times a day (BID) | ORAL | Status: DC
  Administered 2015-03-01: 50 mg via ORAL
  Filled 2015-03-01: qty 1

## 2015-03-01 MED ORDER — ACETAMINOPHEN 325 MG PO TABS: 650.0000 mg | ORAL_TABLET | ORAL | Status: DC | PRN

## 2015-03-01 MED ORDER — ADULT MULTIVITAMIN W/MINERALS CH
1.0000 | ORAL_TABLET | Freq: Every day | ORAL | Status: DC
  Administered 2015-03-01 – 2015-03-07 (×7): 1 via ORAL
  Filled 2015-03-01 (×7): qty 1

## 2015-03-01 MED ORDER — ATORVASTATIN CALCIUM 40 MG PO TABS
40.0000 mg | ORAL_TABLET | Freq: Every day | ORAL | Status: DC
  Administered 2015-03-01 – 2015-03-07 (×7): 40 mg via ORAL
  Filled 2015-03-01 (×7): qty 1

## 2015-03-01 MED ORDER — ASPIRIN 325 MG PO TABS: 325.0000 mg | ORAL_TABLET | Freq: Two times a day (BID) | ORAL | Status: DC

## 2015-03-01 MED ORDER — FUROSEMIDE 40 MG PO TABS
40.0000 mg | ORAL_TABLET | Freq: Every day | ORAL | Status: DC
  Administered 2015-03-02: 40 mg via ORAL
  Filled 2015-03-01: qty 1

## 2015-03-01 MED ORDER — INSULIN DETEMIR 100 UNIT/ML ~~LOC~~ SOLN
40.0000 [IU] | Freq: Two times a day (BID) | SUBCUTANEOUS | Status: DC
  Administered 2015-03-01 – 2015-03-02 (×3): 40 [IU] via SUBCUTANEOUS
  Filled 2015-03-01 (×6): qty 0.4

## 2015-03-01 MED ORDER — FOSINOPRIL SODIUM 20 MG PO TABS
40.0000 mg | ORAL_TABLET | Freq: Every day | ORAL | Status: DC
  Filled 2015-03-01: qty 2

## 2015-03-01 MED ORDER — METOPROLOL TARTRATE 25 MG PO TABS
25.0000 mg | ORAL_TABLET | Freq: Two times a day (BID) | ORAL | Status: DC
  Administered 2015-03-01: 25 mg via ORAL
  Filled 2015-03-01: qty 1

## 2015-03-01 MED ORDER — HYDRALAZINE HCL 10 MG PO TABS: 10.0000 mg | ORAL_TABLET | Freq: Every day | ORAL | Status: DC

## 2015-03-01 MED ORDER — ASPIRIN EC 325 MG PO TBEC
325.0000 mg | DELAYED_RELEASE_TABLET | Freq: Every day | ORAL | Status: DC
  Administered 2015-03-01 – 2015-03-07 (×7): 325 mg via ORAL
  Filled 2015-03-01 (×7): qty 1

## 2015-03-01 MED ORDER — PANTOPRAZOLE SODIUM 40 MG PO TBEC
40.0000 mg | DELAYED_RELEASE_TABLET | Freq: Every day | ORAL | Status: DC
  Administered 2015-03-01 – 2015-03-07 (×7): 40 mg via ORAL
  Filled 2015-03-01 (×7): qty 1

## 2015-03-01 MED ORDER — INSULIN ASPART 100 UNIT/ML ~~LOC~~ SOLN
0.0000 [IU] | Freq: Three times a day (TID) | SUBCUTANEOUS | Status: DC
  Administered 2015-03-01: 1 [IU] via SUBCUTANEOUS
  Administered 2015-03-01: 2 [IU] via SUBCUTANEOUS
  Administered 2015-03-02 – 2015-03-03 (×2): 1 [IU] via SUBCUTANEOUS
  Administered 2015-03-04: 5 [IU] via SUBCUTANEOUS
  Administered 2015-03-04: 1 [IU] via SUBCUTANEOUS

## 2015-03-01 MED ORDER — MORPHINE SULFATE (PF) 2 MG/ML IV SOLN: 2.0000 mg | INTRAVENOUS | Status: DC | PRN

## 2015-03-01 MED ORDER — LISINOPRIL 40 MG PO TABS: 40.0000 mg | ORAL_TABLET | Freq: Every day | ORAL | Status: DC

## 2015-03-01 MED ORDER — ONDANSETRON HCL 4 MG/2ML IJ SOLN
4.0000 mg | Freq: Four times a day (QID) | INTRAMUSCULAR | Status: DC | PRN
  Administered 2015-03-01 – 2015-03-02 (×2): 4 mg via INTRAVENOUS
  Filled 2015-03-01 (×2): qty 2

## 2015-03-01 NOTE — Progress Notes (Signed)
BP=126/38; HR=61. Due for Fosinopril 40mg , Lasix 40mg , Hydralazine 10mg , Metoprolol 50mg  (all PO meds).  Cardiology PA notified.  Will hold all BP meds for now until MD rounds.

## 2015-03-01 NOTE — Progress Notes (Signed)
Patient stated he sure wish he would have been able to shower before coming to hospital.  RN asked patient if he wanted RN to set him up with a bath basin and he could sit on the side of the bed and get washed up.  Patient stated no, if he was going to get washed up he wanted to take a shower.  RN explained to patient that he would need approval from the doctor to get in the shower since he recently was having chest pain and was currently wearing oxygen and had orders to wear the heart monitor and the heart monitor is not shower proof.  Patient stated understanding.

## 2015-03-01 NOTE — Consult Note (Signed)
CARDIOLOGY CONSULT NOTE   Patient ID: Justin Leach MRN: [id number] DOB/AGE: 12/09/1931 81 y.o.  Admit date: 02/28/2015  Primary Physician   Irven Shelling, MD Primary Cardiologist  Dr. Casimer Lanius (@ [facility]) Reason for Consultation   CP  HPI: Justin Leach is a 81 y.o. male with a history of HTN, DM, HL and CKD  who presented to ER yesterday with complains of CP.   No prior cardiac hx. He does have significant cardiovascular risk factors. NUC MPI in 2008-normal. Last Lexiscan Myoview 03/2014 was normal.   For the past few days, he has been having CP with deep breath. Yesterday around 10am he had worse episode while he was sitting and suddenly developed substernal CP 10/10. Unable to described the characterize the pain. It was worse with deep breath. Denies radiation of pain, diaphoresis, nausea, vomiting, fever chills, orthopnea, PND, Le edema or syncope.   He came to ED for further evaluation and work up so far - POC trop x 2 negative. Top x 2 negative. EKG sinus rhythm with 1st AV block, similar to previous EKG. WBC 16.1  D-dimer 0.86. Creatinine 1.61. CTA showed No evidence of pulmonary embolism. Small pericardial effusion Coronary atherosclerosis with calcified plaque in the distribution of the LAD and at the origin of the RCA.  Prominent scarring and atelectasis at both lung bases. Suggestion of pulmonary venous hypertension without overt edema The heart is mildly enlarged. CXR with mild lef basilar subsegmental atelectasis.   Currently he has mild chest discomfort 0.5-1/10 with deep breath. He was given SL nitro x 3. IV morphine x 2. Nebulizer treatment x 2.    Past Medical History  Diagnosis Date  . Hypertension   . Type II diabetes mellitus ([place])   . Pneumonia 2007  . Cervical disc disease   . Dyslipidemia   . CKD (chronic kidney disease)      Past Surgical History  Procedure Laterality Date  . Eye surgery  1930's    "don't know what for" (05/11/2013)  .  Cholecystectomy    . Cervical disc surgery      "i've had 3 neck ORs; not sure what kind; all thru the back of my neck"  . Posterior fusion cervical spine    . Knee arthroscopy      "had one scope; one open knee OR; not sure which on which side" (05/10/2013)  . Knee arthroplasty      "had one scope; one open knee OR; not sure which on which side" (05/10/2013)  . Rotator cuff repair Bilateral   . Joint replacement      bilateral knees, elbows, and shoulders (on 05/11/2013 pt denies all joint replacements"   . Cataract extraction w/ intraocular lens  implant, bilateral Bilateral     Allergies  Allergen Reactions  . Nifedipine Other (See Comments)    Reaction: made gums swell up. Pt states that he had to have gum surgery after taking.     I have reviewed the patient's current medications . aspirin EC  325 mg Oral Daily  . atorvastatin  40 mg Oral Daily  . fosinopril  40 mg Oral Daily  . furosemide  40 mg Oral Daily  . hydrALAZINE  10 mg Oral Daily  . insulin aspart  0-9 Units Subcutaneous TID WC  . metoprolol  50 mg Oral BID  . multivitamin with minerals  1 tablet Oral Daily  . pantoprazole  40 mg Oral Daily  . vitamin B-12  1,000 mcg  Oral Daily     acetaminophen, morphine injection, ondansetron (ZOFRAN) IV  Prior to Admission medications   Medication Sig Start Date End Date Taking? Authorizing Provider  aspirin 325 MG tablet Take 325 mg by mouth 2 (two) times daily.    Yes Historical Provider, MD  atorvastatin (LIPITOR) 80 MG tablet Take 40 mg by mouth daily. 12/27/13  Yes Historical Provider, MD  Cholecalciferol (VITAMIN D-3) 1000 UNITS CAPS Take 1 capsule by mouth daily.   Yes Historical Provider, MD  fosinopril (MONOPRIL) 40 MG tablet Take 40 mg by mouth daily.  03/06/13  Yes Historical Provider, MD  furosemide (LASIX) 40 MG tablet Take 40 mg by mouth daily.  02/26/13  Yes Historical Provider, MD  hydrALAZINE (APRESOLINE) 10 MG tablet Take 10 mg by mouth daily.  03/16/13   Yes Historical Provider, MD  insulin lispro (HUMALOG) 100 UNIT/ML injection Inject 10-30 Units into the skin 3 (three) times daily before meals.   Yes Historical Provider, MD  metoprolol (LOPRESSOR) 50 MG tablet Take 50 mg by mouth 2 (two) times daily.   Yes Historical Provider, MD  Multiple Vitamin (MULTIVITAMIN WITH MINERALS) TABS tablet Take 1 tablet by mouth daily.   Yes Historical Provider, MD  omeprazole (PRILOSEC) 20 MG capsule Take 20 mg by mouth daily.  02/26/13  Yes Historical Provider, MD  vitamin B-12 (CYANOCOBALAMIN) 1000 MCG tablet Take 1,000 mcg by mouth daily.   Yes Historical Provider, MD     Social History   Social History  . Marital Status: Married    Spouse Name: Vaughan Basta  . Number of Children: 0  . Years of Education: N/A   Occupational History  . Retired from Architect work.    Social History Main Topics  . Smoking status: Never Smoker   . Smokeless tobacco: Former Systems developer    Types: Chew  . Alcohol Use: No  . Drug Use: No     Comment: used chew when I was a teenager per patient   . Sexual Activity: Not on file   Other Topics Concern  . Not on file   Social History Narrative   Married.  Lives with wife.  Ambulates independently.  Has a dog names is named Programmer, applications  .    Family Status  Relation Status Death Age  . Mother Deceased 60    Old age  . Father Deceased    Family History  Problem Relation Age of Onset  . Heart disease Neg Hx     He does not know his father's history.   . Cancer Neg Hx   . Diabetes Neg Hx   . Diabetes Mellitus II Paternal Grandmother      ROS:  Full 14 point review of systems complete and found to be negative unless listed above.  Physical Exam: Blood pressure 130/41, pulse 62, temperature 97.3 F (36.3 C), temperature source Oral, resp. rate 17, height 5\' 3"  (1.6 m), weight 188 lb (85.276 kg), SpO2 91 %.  General: Well developed, well nourished, male in no acute distress Head: Eyes PERRLA, No xanthomas. Normocephalic and  atraumatic, oropharynx without edema or exudate.  Lungs: Resp regular and unlabored. Bibasilar rates with diminished breath sound. Tender to palpation at xiphoid process.  Heart: RRR no s3, s4, or murmurs. Neck: No carotid bruits. No lymphadenopathy.  No JVD. Abdomen: Bowel sounds present, abdomen soft and non-tender without masses or hernias noted. Msk:  No spine or cva tenderness. No weakness, no joint deformities or effusions. Extremities: No clubbing, cyanosis  or edema. DP/PT/Radials 2+ and equal bilaterally. Neuro: Alert and oriented X 3. No focal deficits noted. Psych:  Good affect, responds appropriately Skin: No rashes or lesions noted.  Labs:   Lab Results  Component Value Date   WBC 16.1* 02/28/2015   HGB 14.7 02/28/2015   HCT 45.0 02/28/2015   MCV 97.0 02/28/2015   PLT 217 02/28/2015   No results for input(s): INR in the last 72 hours.  Recent Labs Lab 02/28/15 1407  NA 140  K 4.6  CL 104  CO2 30  BUN 29*  CREATININE 1.61*  CALCIUM 9.6  GLUCOSE 100*   No results found for: MG  Recent Labs  03/01/15 0100 03/01/15 0540  TROPONINI <0.03 <0.03    Recent Labs  02/28/15 1417 02/28/15 1649  TROPIPOC 0.01 0.02   No results found for: CHOL, HDL, LDLCALC, TRIG Lab Results  Component Value Date   DDIMER 0.86* 02/28/2015    Echo: Pending  ECG:   PR interval 256 ms QRS duration 81 ms QT/QTc 383/401 ms P-R-T axes 23 -34 79  Radiology:  Dg Chest 2 View  02/28/2015  CLINICAL DATA:  Chest pain. EXAM: CHEST  2 VIEW COMPARISON:  November 22, 2014.  March 01, 2014. FINDINGS: The heart size and mediastinal contours are within normal limits. No pneumothorax or pleural effusion is noted. Right lung is clear. Increased left basilar opacity is noted concerning for subsegmental atelectasis. Stable compression deformity of L1 vertebral body is noted consistent with old fracture. IMPRESSION: Mild left basilar subsegmental atelectasis. No other significant abnormality seen  in the chest. Electronically Signed   By: Marijo Conception, M.D.   On: 02/28/2015 14:48   Ct Angio Chest Pe W/cm &/or Wo Cm  02/28/2015  CLINICAL DATA:  Mid chest pain and shortness of breath. EXAM: CT ANGIOGRAPHY CHEST WITH CONTRAST TECHNIQUE: Multidetector CT imaging of the chest was performed using the standard protocol during bolus administration of intravenous contrast. Multiplanar CT image reconstructions and MIPs were obtained to evaluate the vascular anatomy. CONTRAST:  48mL OMNIPAQUE IOHEXOL 350 MG/ML SOLN COMPARISON:  Chest x-ray earlier today P FINDINGS: The pulmonary arteries are adequately opacified. There is no evidence of pulmonary embolism. The thoracic aorta is normal in caliber and shows no evidence of aneurysm or dissection. Mild atherosclerosis is noted of the aortic arch. There is a small amount of pericardial fluid. The heart is mildly enlarged. Calcified plaque is noted in the distribution of the LAD. There also is calcified plaque at the origin of the right coronary artery. Lungs show evidence of significant scarring at both bases with scattered areas of subsegmental atelectasis. There may be a mild component of pulmonary venous hypertension without overt edema. No pleural fluid is identified. No focal airspace consolidation, pneumothorax or pulmonary nodule. No airway obstruction. No enlarged lymph nodes are seen. Visualized upper abdomen is unremarkable. Bony structures show mild spondylosis of the thoracic spine. Review of the MIP images confirms the above findings. IMPRESSION: 1. No evidence of pulmonary embolism. 2. Small pericardial effusion 3. Coronary atherosclerosis with calcified plaque in the distribution of the LAD and at the origin of the RCA. 4. Prominent scarring and atelectasis at both lung bases. 5. Suggestion of pulmonary venous hypertension without overt edema. The heart is mildly enlarged. Electronically Signed   By: Aletta Edouard M.D.   On: 02/28/2015 17:31     ASSESSMENT AND PLAN:     1. Chest pain - Patient has atypical and pleuritic component.  -  POC trop x 2 negative. Top x 2 negative. EKG sinus rhythm with 1st AV block, similar to previous EKG. WBC 16.1  D-dimer 0.86. Creatinine 1.61. CTA showed No evidence of pulmonary embolism. Small pericardial effusion Coronary atherosclerosis with calcified plaque in the distribution of the LAD and at the origin of the RCA.  Prominent scarring and atelectasis at both lung bases. Suggestion of pulmonary venous hypertension without overt edema The heart is mildly enlarged. CXR with mild lef basilar subsegmental atelectasis.  - Pain is reproducible with palpation.  - Differential  include pericarditis, costochondritis vs MSK - Patient had normal Lexiscan 03/2014. Pending 2D echo. Patient is NPO. Will discuss with MD inpatient vs outpatient myoview.  - No lab done this morning. Will get BMET and CBC.  - Continue ASA, statin, lopressor  2. HTN - BP relatively stable with low DBP  3. HL - Continue statin  4. Acute on CKD, stage III - Avoid nephrotoxic agent.  - He is on lasix 40mg  daily at home. CTA showed mall spericardial effusion. Lungs with bibasilar rales. No signs of heart failure.  - Consider one dose of IV lasix?  - Net I/O +1.1L.    5. DM - on SSI  Signed: Avaiah Stempel, PA 03/01/2015, 8:31 AM Pager [phone]  Co-Sign MD

## 2015-03-01 NOTE — Progress Notes (Signed)
Justin Leach NWG:[id number] DOB: 12/20/1931 DOA: 02/28/2015 PCP: Irven Shelling, MD  Brief narrative: 81 y/o ? Nuclear stress 2008 normal, last Myoview 03/2014 normal with EF of 63% HTN Diabetes mellitus TY II HLD CKD stage III-IV baseline  Admitted to the hospital 02/28/15 retrosternal chest pain persistent and EKG will show nonspecific findings-Cardiac enzymes were negative had CT scan of chest10/11 which did not show anypulmonary embolism  Past medical history-As per Problem list Chart reviewed as below-   Consultants:  Cardiology  Procedures:    Antibiotics:     Subjective  alert pleasant oriented no distress States chest pain is worse with deep breathing and with moving around but is now much better than it has been Cannot tell me any aggravating or relieving factor just that it is better No nausea no vomiting No blurred vision or double vision No fever no chills no sputum or cough    Objective    Interim History:   Telemetry: sinus   Objective: Filed Vitals:   03/01/15 0413 03/01/15 0440 03/01/15 0742 03/01/15 1034  BP: 116/37  130/41 126/38  Pulse: 60  62 61  Temp: 97.6 F (36.4 C)  97.3 F (36.3 C)   TempSrc: Oral  Oral   Resp: 22  17   Height:  5\' 3"  (1.6 m)    Weight:      SpO2: 98%  91%     Intake/Output Summary (Last 24 hours) at 03/01/15 1216 Last data filed at 03/01/15 0025  Gross per 24 hour  Intake   1120 ml  Output      1 ml  Net   1119 ml    Exam:  General: eomi ncat Cardiovascular: s1 s 2no m/r/g Respiratory: clear no added sound, no rales no rhonchi-cannot appreciate rubs Abdomen:  Soft nt nd no rebound no gaurd Skin intact Neuro moving all 4 limbs ='lly  Data Reviewed: Basic Metabolic Panel:  Recent Labs Lab 02/28/15 1407  NA 140  K 4.6  CL 104  CO2 30  GLUCOSE 100*  BUN 29*  CREATININE 1.61*  CALCIUM 9.6   Liver Function Tests: No results for input(s): AST, ALT, ALKPHOS, BILITOT, PROT,  ALBUMIN in the last 168 hours. No results for input(s): LIPASE, AMYLASE in the last 168 hours. No results for input(s): AMMONIA in the last 168 hours. CBC:  Recent Labs Lab 02/28/15 1407 03/01/15 1122  WBC 16.1* 16.5*  HGB 14.7 11.7*  HCT 45.0 35.2*  MCV 97.0 96.7  PLT 217 146*   Cardiac Enzymes:  Recent Labs Lab 03/01/15 0100 03/01/15 0540  TROPONINI <0.03 <0.03   BNP: Invalid input(s): POCBNP CBG:  Recent Labs Lab 02/28/15 1752 03/01/15 0745 03/01/15 1117  GLUCAP 78 183* 169*    No results found for this or any previous visit (from the past 240 hour(s)).   Studies:              All Imaging reviewed and is as per above notation   Scheduled Meds: . aspirin EC  325 mg Oral Daily  . atorvastatin  40 mg Oral Daily  . furosemide  40 mg Oral Daily  . hydrALAZINE  10 mg Oral Daily  . insulin aspart  0-9 Units Subcutaneous TID WC  . lisinopril  40 mg Oral Daily  . metoprolol  50 mg Oral BID  . multivitamin with minerals  1 tablet Oral Daily  . pantoprazole  40 mg Oral Daily  . vitamin B-12  1,000 mcg Oral  Daily   Continuous Infusions:    Assessment/Plan:  1. pleuritic chest pain with unlikely cardiogenic features-defer to cardiology however not sure if needsfurther ischemic evaluation as normal Lexiscan 03/2014. Await echocardiogram. May be able to try colchicine once labs are back. 2. Acute kidney injury  superimposed on baseline stage IIICK D-repeat labs stat. likely cause is ACE inhibitor, mild hypotension from hydralazine 10, metoprolol 50 as well as dilute from CT angiogram of chest.,Hyperkalemia may need to be treated if potassium is above 5.5. Discontinue lisinopril 40 daily. Monitor labs in a.m. might need to also add low-dose IV fluids dependent on repeat labs. 3. Normal stress 2008, normal EF 63% last Myoview 11/15-continue hydralazine 10 daily, metoprolol 50 twice a day. Appreciate cardiology input 4. hypertensionin a setting ofrelative  hypotension-discontinue hydralazine 10, lower rate of metoprolol 50-25 twice a day, discontinue lisinopril as above 5. Type 2 diabetes mellitus-continue sliding scale coverage 160 to 180s 6. Hyperlipidemia-continue atorvastatin moderate intensity/high intensity dosing 40 mg 7. GERD-continue pantoprazole 40 daily, may need to adjust for renal clearance   fll code Likely home soon Inpatient pending resolution  Verneita Griffes, MD  [facility] Pager [phone] 03/01/2015, 12:16 PM    LOS: 1 day

## 2015-03-02 ENCOUNTER — Observation Stay (HOSPITAL_COMMUNITY): Payer: BLUE CROSS/BLUE SHIELD

## 2015-03-02 ENCOUNTER — Encounter (HOSPITAL_COMMUNITY): Payer: Self-pay | Admitting: Physician Assistant

## 2015-03-02 DIAGNOSIS — R0789 Other chest pain: Principal | ICD-10-CM

## 2015-03-02 DIAGNOSIS — N189 Chronic kidney disease, unspecified: Secondary | ICD-10-CM

## 2015-03-02 DIAGNOSIS — R079 Chest pain, unspecified: Secondary | ICD-10-CM

## 2015-03-02 DIAGNOSIS — I10 Essential (primary) hypertension: Secondary | ICD-10-CM

## 2015-03-02 DIAGNOSIS — I4891 Unspecified atrial fibrillation: Secondary | ICD-10-CM

## 2015-03-02 DIAGNOSIS — R0781 Pleurodynia: Secondary | ICD-10-CM

## 2015-03-02 DIAGNOSIS — N179 Acute kidney failure, unspecified: Secondary | ICD-10-CM

## 2015-03-02 LAB — BASIC METABOLIC PANEL
ANION GAP: 12 (ref 5–15)
BUN: 57 mg/dL — ABNORMAL HIGH (ref 6–20)
CHLORIDE: 97 mmol/L — AB (ref 101–111)
CO2: 26 mmol/L (ref 22–32)
Calcium: 8.9 mg/dL (ref 8.9–10.3)
Creatinine, Ser: 3.11 mg/dL — ABNORMAL HIGH (ref 0.61–1.24)
GFR calc Af Amer: 20 mL/min — ABNORMAL LOW (ref >60)
GFR, EST NON AFRICAN AMERICAN: 17 mL/min — AB (ref >60)
GLUCOSE: 227 mg/dL — AB (ref 65–99)
POTASSIUM: 5 mmol/L (ref 3.5–5.1)
SODIUM: 135 mmol/L (ref 135–145)

## 2015-03-02 LAB — GLUCOSE, CAPILLARY
GLUCOSE-CAPILLARY: 144 mg/dL — AB (ref 65–99)
GLUCOSE-CAPILLARY: 146 mg/dL — AB (ref 65–99)
GLUCOSE-CAPILLARY: 131 mg/dL — AB (ref 65–99)
Glucose-Capillary: 74 mg/dL (ref 65–99)

## 2015-03-02 LAB — HEPARIN LEVEL (UNFRACTIONATED)
HEPARIN UNFRACTIONATED: 0.44 [IU]/mL (ref 0.30–0.70)
Heparin Unfractionated: 0.27 IU/mL — ABNORMAL LOW (ref 0.30–0.70)

## 2015-03-02 LAB — CBC
HCT: 36.1 % — ABNORMAL LOW (ref 39.0–52.0)
Hemoglobin: 11.7 g/dL — ABNORMAL LOW (ref 13.0–17.0)
MCH: 31.4 pg (ref 26.0–34.0)
MCHC: 32.4 g/dL (ref 30.0–36.0)
MCV: 96.8 fL (ref 78.0–100.0)
PLATELETS: 159 10*3/uL (ref 150–400)
RBC: 3.73 MIL/uL — AB (ref 4.22–5.81)
RDW: 14.6 % (ref 11.5–15.5)
WBC: 13.3 10*3/uL — AB (ref 4.0–10.5)

## 2015-03-02 LAB — MAGNESIUM
MAGNESIUM: 2.1 mg/dL (ref 1.7–2.4)
MAGNESIUM: 2.2 mg/dL (ref 1.7–2.4)

## 2015-03-02 MED ORDER — HEPARIN (PORCINE) IN NACL 100-0.45 UNIT/ML-% IJ SOLN
1250.0000 [IU]/h | INTRAMUSCULAR | Status: DC
  Administered 2015-03-02: 1100 [IU]/h via INTRAVENOUS
  Administered 2015-03-02: 1250 [IU]/h via INTRAVENOUS
  Filled 2015-03-02 (×3): qty 250

## 2015-03-02 MED ORDER — OFF THE BEAT BOOK
Freq: Once | Status: AC
  Administered 2015-03-02: 06:00:00
  Filled 2015-03-02: qty 1

## 2015-03-02 MED ORDER — HEPARIN BOLUS VIA INFUSION
3000.0000 [IU] | Freq: Once | INTRAVENOUS | Status: AC
  Administered 2015-03-02: 3000 [IU] via INTRAVENOUS
  Filled 2015-03-02: qty 3000

## 2015-03-02 MED ORDER — METOPROLOL TARTRATE 25 MG PO TABS
25.0000 mg | ORAL_TABLET | Freq: Four times a day (QID) | ORAL | Status: DC
  Administered 2015-03-02 – 2015-03-05 (×11): 25 mg via ORAL
  Filled 2015-03-02 (×11): qty 1

## 2015-03-02 MED ORDER — DILTIAZEM LOAD VIA INFUSION
15.0000 mg | Freq: Once | INTRAVENOUS | Status: AC
  Administered 2015-03-02: 15 mg via INTRAVENOUS
  Filled 2015-03-02: qty 15

## 2015-03-02 MED ORDER — HEPARIN BOLUS VIA INFUSION
1000.0000 [IU] | Freq: Once | INTRAVENOUS | Status: AC
  Administered 2015-03-02: 1000 [IU] via INTRAVENOUS
  Filled 2015-03-02: qty 1000

## 2015-03-02 MED ORDER — SODIUM CHLORIDE 0.9 % IV SOLN
INTRAVENOUS | Status: DC
  Administered 2015-03-02 – 2015-03-03 (×2): via INTRAVENOUS

## 2015-03-02 MED ORDER — DILTIAZEM HCL 100 MG IV SOLR
5.0000 mg/h | INTRAVENOUS | Status: DC
  Administered 2015-03-02: 5 mg/h via INTRAVENOUS
  Filled 2015-03-02: qty 100

## 2015-03-02 NOTE — Progress Notes (Signed)
ANTICOAGULATION CONSULT NOTE - Initial Consult  Pharmacy Consult for heparin Indication: atrial fibrillation  Allergies  Allergen Reactions  . Nifedipine Other (See Comments)    Reaction: made gums swell up. Pt states that he had to have gum surgery after taking.     Patient Measurements: Height: 5\' 3"  (160 cm) (per patient report ) Weight: 188 lb (85.276 kg) IBW/kg (Calculated) : 56.9 Heparin Dosing Weight: 75kg  Vital Signs: Temp: 99.2 F (37.3 C) (10/13 0100) Temp Source: Oral (10/13 0100) BP: 148/55 mmHg (10/13 0100) Pulse Rate: 86 (10/13 0100)  Labs:  Recent Labs  02/28/15 1407 03/01/15 0100 03/01/15 0540 03/01/15 1122 03/01/15 1334  HGB 14.7  --   --  11.7*  --   HCT 45.0  --   --  35.2*  --   PLT 217  --   --  146*  --   CREATININE 1.61*  --   --  2.57* 2.71*  TROPONINI  --  <0.03 <0.03 <0.03  --     Estimated Creatinine Clearance: 20.7 mL/min (by C-G formula based on Cr of 2.71).   Medical History: Past Medical History  Diagnosis Date  . Hypertension   . Type II diabetes mellitus ([place])   . Pneumonia 2007  . Cervical disc disease   . Dyslipidemia   . CKD (chronic kidney disease)     Medications:  Prescriptions prior to admission  Medication Sig Dispense Refill Last Dose  . aspirin 325 MG tablet Take 325 mg by mouth 2 (two) times daily.    02/28/2015 at Unknown time  . atorvastatin (LIPITOR) 80 MG tablet Take 40 mg by mouth daily.  2 02/28/2015 at Unknown time  . Cholecalciferol (VITAMIN D-3) 1000 UNITS CAPS Take 1 capsule by mouth daily.   02/28/2015 at Unknown time  . fosinopril (MONOPRIL) 40 MG tablet Take 40 mg by mouth daily.    02/28/2015 at Unknown time  . furosemide (LASIX) 40 MG tablet Take 40 mg by mouth daily.    02/28/2015 at Unknown time  . hydrALAZINE (APRESOLINE) 10 MG tablet Take 10 mg by mouth daily.    02/28/2015 at Unknown time  . insulin detemir (LEVEMIR) 100 UNIT/ML injection Inject 50-60 Units into the skin 2 (two) times  daily. Inject 60 units at 9 am and 50 units at 9 pm   02/28/2015 at Unknown time  . insulin lispro (HUMALOG) 100 UNIT/ML injection Inject 10-30 Units into the skin 3 (three) times daily before meals.   02/28/2015 at Unknown time  . metoprolol (LOPRESSOR) 50 MG tablet Take 50 mg by mouth 2 (two) times daily.   02/28/2015 at 0630  . Multiple Vitamin (MULTIVITAMIN WITH MINERALS) TABS tablet Take 1 tablet by mouth daily.   02/28/2015 at Unknown time  . omeprazole (PRILOSEC) 20 MG capsule Take 20 mg by mouth daily.    02/28/2015 at Unknown time  . vitamin B-12 (CYANOCOBALAMIN) 1000 MCG tablet Take 1,000 mcg by mouth daily.   02/28/2015 at Unknown time   Scheduled:  . aspirin EC  325 mg Oral Daily  . atorvastatin  40 mg Oral Daily  . diltiazem  15 mg Intravenous Once  . furosemide  40 mg Oral Daily  . insulin aspart  0-9 Units Subcutaneous TID WC  . insulin detemir  40 Units Subcutaneous BID  . metoprolol  25 mg Oral BID  . multivitamin with minerals  1 tablet Oral Daily  . pantoprazole  40 mg Oral Daily  . vitamin B-12  1,000 mcg Oral Daily   Infusions:  . diltiazem (CARDIZEM) infusion 5 mg/hr (03/02/15 0202)    Assessment: 81yo male c/o substernal CP w/ inspiration since 1130 in am, D-dimer elevated but CT negative for PE, overnight pt went into Afib w/ RVR, to begin heparin.  Goal of Therapy:  Heparin level 0.3-0.7 units/ml Monitor platelets by anticoagulation protocol: Yes   Plan:  Will give heparin 3000 units IV bolus x1 followed by gtt at 1100 units/hr and monitor heparin levels and CBC.  Wynona Neat, PharmD, BCPS  03/02/2015,1:58 AM

## 2015-03-02 NOTE — Progress Notes (Signed)
RN received alert patient heart rate elevated.  RN into check on patient and patient was awake, had been sleeping and stated he just woke up and didn't feel good.  EKG completed, confirmed atrial fibrillation with rapid ventricular response.  Heart rate majority 120-140's.  RN asked patient if he could explain/describe to nurse how he was feeling and he said he couldn't he just didn't feel well.  Patient denying shortness of breath and chest pain.  Cardiology paged.

## 2015-03-02 NOTE — Progress Notes (Signed)
ANTICOAGULATION CONSULT NOTE - Initial Consult  Pharmacy Consult for heparin Indication: atrial fibrillation  Allergies  Allergen Reactions  . Nifedipine Other (See Comments)    Reaction: made gums swell up. Pt states that he had to have gum surgery after taking.     Patient Measurements: Height: 5\' 3"  (160 cm) (per patient report ) Weight: 189 lb 1.6 oz (85.775 kg) IBW/kg (Calculated) : 56.9 Heparin Dosing Weight: 75kg  Vital Signs: Temp: 98.1 F (36.7 C) (10/13 0510) Temp Source: Oral (10/13 0510) BP: 123/98 mmHg (10/13 0947) Pulse Rate: 100 (10/13 0947)  Labs:  Recent Labs  02/28/15 1407 03/01/15 0100 03/01/15 0540 03/01/15 1122 03/01/15 1334 03/02/15 0155 03/02/15 1008  HGB 14.7  --   --  11.7*  --  11.7*  --   HCT 45.0  --   --  35.2*  --  36.1*  --   PLT 217  --   --  146*  --  159  --   HEPARINUNFRC  --   --   --   --   --   --  0.27*  CREATININE 1.61*  --   --  2.57* 2.71* 3.11*  --   TROPONINI  --  <0.03 <0.03 <0.03  --   --   --     Estimated Creatinine Clearance: 18 mL/min (by C-G formula based on Cr of 3.11).   Medical History: Past Medical History  Diagnosis Date  . Hypertension   . Type II diabetes mellitus ([place])   . Pneumonia 2007  . Cervical disc disease   . Dyslipidemia   . CKD (chronic kidney disease)     Medications:  Prescriptions prior to admission  Medication Sig Dispense Refill Last Dose  . aspirin 325 MG tablet Take 325 mg by mouth 2 (two) times daily.    02/28/2015 at Unknown time  . atorvastatin (LIPITOR) 80 MG tablet Take 40 mg by mouth daily.  2 02/28/2015 at Unknown time  . Cholecalciferol (VITAMIN D-3) 1000 UNITS CAPS Take 1 capsule by mouth daily.   02/28/2015 at Unknown time  . fosinopril (MONOPRIL) 40 MG tablet Take 40 mg by mouth daily.    02/28/2015 at Unknown time  . furosemide (LASIX) 40 MG tablet Take 40 mg by mouth daily.    02/28/2015 at Unknown time  . hydrALAZINE (APRESOLINE) 10 MG tablet Take 10 mg by mouth  daily.    02/28/2015 at Unknown time  . insulin detemir (LEVEMIR) 100 UNIT/ML injection Inject 50-60 Units into the skin 2 (two) times daily. Inject 60 units at 9 am and 50 units at 9 pm   02/28/2015 at Unknown time  . insulin lispro (HUMALOG) 100 UNIT/ML injection Inject 10-30 Units into the skin 3 (three) times daily before meals.   02/28/2015 at Unknown time  . metoprolol (LOPRESSOR) 50 MG tablet Take 50 mg by mouth 2 (two) times daily.   02/28/2015 at 0630  . Multiple Vitamin (MULTIVITAMIN WITH MINERALS) TABS tablet Take 1 tablet by mouth daily.   02/28/2015 at Unknown time  . omeprazole (PRILOSEC) 20 MG capsule Take 20 mg by mouth daily.    02/28/2015 at Unknown time  . vitamin B-12 (CYANOCOBALAMIN) 1000 MCG tablet Take 1,000 mcg by mouth daily.   02/28/2015 at Unknown time   Scheduled:  . aspirin EC  325 mg Oral Daily  . atorvastatin  40 mg Oral Daily  . furosemide  40 mg Oral Daily  . heparin  1,000 Units Intravenous Once  .  insulin aspart  0-9 Units Subcutaneous TID WC  . insulin detemir  40 Units Subcutaneous BID  . metoprolol  25 mg Oral 4 times per day  . multivitamin with minerals  1 tablet Oral Daily  . pantoprazole  40 mg Oral Daily  . vitamin B-12  1,000 mcg Oral Daily   Infusions:  . sodium chloride 75 mL/hr at 03/02/15 0753  . heparin 1,100 Units/hr (03/02/15 4656)    Assessment: 81 yo male presenting with CP went into afib with RVR overnight 10/12  PMH: DM, HTN, CKD, HLD  AC: none PTA. Now heparin for afib. HL 0.27 on 1100 units/hr  CV: CT neg for PE, Pending ECHO. AFib with RVR overnight 10/12  Renal: SCr 3.11  Heme: H&H 11.7/36.1, Plt 159  Goal of Therapy:  Heparin level 0.3-0.7 units/ml Monitor platelets by anticoagulation protocol: Yes   Plan:  Bolus heparin 1000 units followed by rate of 1250 units/hr HL at 1800 Daily HL, CBC Monitor for s/sx of bleeding F/u plans for long-term [facility]  Levester Fresh, PharmD, [facility] Clinical Pharmacist Pager  [phone] 03/02/2015 10:56 AM

## 2015-03-02 NOTE — Progress Notes (Signed)
S: Notified patient in Afib with RVR. EKG confirmed A: Afib with RVR P: IV Dilt bolus followed by infusion     IV Heparin     TTE in am    Basic labs

## 2015-03-02 NOTE — Progress Notes (Signed)
Patient Name: Justin Leach Date of Encounter: 03/02/2015   SUBJECTIVE  Went into afib RVR last night. Rate stable currently on IV Dilt. IV heparin for anticoagulation. Feeling well. Denies CP, sob or palpation.   CURRENT MEDS . aspirin EC  325 mg Oral Daily  . atorvastatin  40 mg Oral Daily  . furosemide  40 mg Oral Daily  . insulin aspart  0-9 Units Subcutaneous TID WC  . insulin detemir  40 Units Subcutaneous BID  . metoprolol  25 mg Oral BID  . multivitamin with minerals  1 tablet Oral Daily  . pantoprazole  40 mg Oral Daily  . vitamin B-12  1,000 mcg Oral Daily    OBJECTIVE  Filed Vitals:   03/02/15 0510 03/02/15 0618 03/02/15 0628 03/02/15 0757  BP:   125/40 109/44  Pulse: 95     Temp: 98.1 F (36.7 C)     TempSrc: Oral     Resp: 18     Height:      Weight:  189 lb 1.6 oz (85.775 kg)    SpO2: 96%       Intake/Output Summary (Last 24 hours) at 03/02/15 0841 Last data filed at 03/02/15 0803  Gross per 24 hour  Intake 324.17 ml  Output    200 ml  Net 124.17 ml   Filed Weights   03/01/15 0013 03/02/15 0618  Weight: 188 lb (85.276 kg) 189 lb 1.6 oz (85.775 kg)    PHYSICAL EXAM  General: Pleasant, NAD. Neuro: Alert and oriented X 3. Moves all extremities spontaneously. Psych: Normal affect. HEENT:  Normal  Neck: Supple without bruits or JVD. Lungs:  Resp regular and unlabored. Lungs with bibasilar rales.  Heart: RRR no s3, s4, or murmurs. Abdomen: Soft, non-tender, non-distended, BS + x 4.  Extremities: No clubbing, cyanosis or edema. DP/PT/Radials 2+ and equal bilaterally.  Accessory Clinical Findings  CBC  Recent Labs  03/01/15 1122 03/02/15 0155  WBC 16.5* 13.3*  HGB 11.7* 11.7*  HCT 35.2* 36.1*  MCV 96.7 96.8  PLT 146* 449   Basic Metabolic Panel  Recent Labs  03/01/15 1334 03/02/15 0155  NA 135 135  K 4.9 5.0  CL 99* 97*  CO2 27 26  GLUCOSE 178* 227*  BUN 42* 57*  CREATININE 2.71* 3.11*  CALCIUM 8.6* 8.9  MG  --  2.1    Cardiac Enzymes  Recent Labs  03/01/15 0100 03/01/15 0540 03/01/15 1122  TROPONINI <0.03 <0.03 <0.03   BNP Invalid input(s): POCBNP D-Dimer  Recent Labs  02/28/15 1407  DDIMER 0.86*    TELE  afib at rate of 60-70s. Transiently at rate of low 40s.   Radiology/Studies  Dg Chest 2 View  02/28/2015  CLINICAL DATA:  Chest pain. EXAM: CHEST  2 VIEW COMPARISON:  November 22, 2014.  March 01, 2014. FINDINGS: The heart size and mediastinal contours are within normal limits. No pneumothorax or pleural effusion is noted. Right lung is clear. Increased left basilar opacity is noted concerning for subsegmental atelectasis. Stable compression deformity of L1 vertebral body is noted consistent with old fracture. IMPRESSION: Mild left basilar subsegmental atelectasis. No other significant abnormality seen in the chest. Electronically Signed   By: Marijo Conception, M.D.   On: 02/28/2015 14:48   Ct Angio Chest Pe W/cm &/or Wo Cm  02/28/2015  CLINICAL DATA:  Mid chest pain and shortness of breath. EXAM: CT ANGIOGRAPHY CHEST WITH CONTRAST TECHNIQUE: Multidetector CT imaging of the chest was performed  using the standard protocol during bolus administration of intravenous contrast. Multiplanar CT image reconstructions and MIPs were obtained to evaluate the vascular anatomy. CONTRAST:  58mL OMNIPAQUE IOHEXOL 350 MG/ML SOLN COMPARISON:  Chest x-ray earlier today P FINDINGS: The pulmonary arteries are adequately opacified. There is no evidence of pulmonary embolism. The thoracic aorta is normal in caliber and shows no evidence of aneurysm or dissection. Mild atherosclerosis is noted of the aortic arch. There is a small amount of pericardial fluid. The heart is mildly enlarged. Calcified plaque is noted in the distribution of the LAD. There also is calcified plaque at the origin of the right coronary artery. Lungs show evidence of significant scarring at both bases with scattered areas of subsegmental  atelectasis. There may be a mild component of pulmonary venous hypertension without overt edema. No pleural fluid is identified. No focal airspace consolidation, pneumothorax or pulmonary nodule. No airway obstruction. No enlarged lymph nodes are seen. Visualized upper abdomen is unremarkable. Bony structures show mild spondylosis of the thoracic spine. Review of the MIP images confirms the above findings. IMPRESSION: 1. No evidence of pulmonary embolism. 2. Small pericardial effusion 3. Coronary atherosclerosis with calcified plaque in the distribution of the LAD and at the origin of the RCA. 4. Prominent scarring and atelectasis at both lung bases. 5. Suggestion of pulmonary venous hypertension without overt edema. The heart is mildly enlarged. Electronically Signed   By: Aletta Edouard M.D.   On: 02/28/2015 17:31    ASSESSMENT AND PLAN  1. Chest pain - Patient has atypical and pleuritic component. Currently chest pain free.  - Top x 3 negative. EKG sinus rhythm with 1st AV block, similar to previous EKG.  D-dimer 0.86.  CTA showed No evidence of pulmonary embolism. Small pericardial effusion Coronary atherosclerosis with calcified plaque in the distribution of the LAD and at the origin of the RCA. Prominent scarring and atelectasis at both lung bases. Suggestion of pulmonary venous hypertension without overt edema The heart is mildly enlarged. CXR with mild lef basilar subsegmental atelectasis.  - Patient had normal Lexiscan 03/2014. Still pending 2D echo. Inpatient vs outpatient Myoview. Patient has eaten today.  - Continue ASA, statin, lopressor - Lisinopril held due to worsening Scr  2. HTN - BP relatively stable with low DBP - As above  3. HL - Continue statin  4. Acute on CKD, stage III - Avoid nephrotoxic agent. Cr worsen to 3.11. Likely from contrast.  - He is on lasix 40mg  daily at home. CTA showed mall spericardial effusion. Lungs with bibasilar rales.  -Lasix was held  yesterday to due low BP. Will discuss with MD.  - Net I/O +1.2L.   5. New onset afib with RVR - CHADSVASC score of 4 (age, HTN, DM). Currently on IV dilt 77ml/hr, rate stable 60-70s. Transiently to low 40s. Discontinue Dilt IV and start metoprolol 25mg  q 6 hours. Likely DCCV tomorrow.  - IV heparin for anticoagulation. Will discuss with MD for long term anticoagulation management.   Signed, Bhagat,Bhavinkumar PA-C   I have seen, examined and evaluated the patient this AM along with Mr. [place], [place].  After reviewing all the available data and chart,  I agree with his findings, examination as well as impression recommendations.  Admitted for sounds like pleuritic chest pain, but unfortunately last night he went into atrial fibrillation with RVR which is a new diagnosis for him. At this point with worsening renal function, he will likely stay inpatient for a little while longer. He  is on IV heparin for anticoagulation and would consider potentially using warfarin given his renal insufficiency at this time. Convert from IV diltiazem to back to his home dose of metoprolol but will do 25 every 6 hours as opposed to 50 twice a day based on borderline blood pressures.  Echo pending for today.  He has no signs of volume overload, therefore no need to be aggressive with any diuresis especially in light of acute on chronic renal insufficiency..  I suspect his acute on chronic renal insufficiency is related to combination of hypotension, ACE inhibitor dosing in conjunction with contrast induced nephropathy where there is probably contrast induced nephropathy and ATN.  We'll continue to follow and assist with A. fib.   Leonie Man, M.D., M.S. Interventional Cardiologist   Pager # [phone]

## 2015-03-02 NOTE — Progress Notes (Addendum)
Justin Leach VHQ:[id number] DOB: Feb 10, 1932 DOA: 02/28/2015 PCP: Irven Shelling, MD  Brief narrative: 81 y/o ? Nuclear stress 2008 normal, last Myoview 03/2014 normal with EF of 63% HTN Diabetes mellitus TY II HLD CKD stage III-IV baseline  Admitted to the hospital 02/28/15 retrosternal chest pain persistent and EKG will show nonspecific findings-Cardiac enzymes were negative had CT scan of chest10/11 which did not show anypulmonary embolism  Developed AKI with rising creatining and then flipped into new onset Afib with RVR 10/13  Past medical history-As per Problem list Chart reviewed as below-   Consultants:  Cardiology  Procedures:    Antibiotics:     Subjective      Objective    Interim History:   Telemetry: sinus   Objective: Filed Vitals:   03/02/15 0628 03/02/15 0757 03/02/15 0947 03/02/15 1113  BP: 125/40 109/44 123/98   Pulse:   100   Temp:    98.4 F (36.9 C)  TempSrc:    Oral  Resp:      Height:      Weight:      SpO2:        Intake/Output Summary (Last 24 hours) at 03/02/15 1322 Last data filed at 03/02/15 0854  Gross per 24 hour  Intake 564.17 ml  Output    200 ml  Net 364.17 ml    Exam:  General: eomi ncat Cardiovascular: s1 s 2no m/r/g Respiratory: clear no added sound, no rales no rhonchi-cannot appreciate rubs Abdomen:  Soft nt nd no rebound no gaurd Skin intact Neuro moving all 4 limbs ='lly  Data Reviewed: Basic Metabolic Panel:  Recent Labs Lab 02/28/15 1407 03/01/15 1122 03/01/15 1334 03/02/15 0155 03/02/15 1008  NA 140 134* 135 135  --   K 4.6 5.3* 4.9 5.0  --   CL 104 98* 99* 97*  --   CO2 30 27 27 26   --   GLUCOSE 100* 201* 178* 227*  --   BUN 29* 39* 42* 57*  --   CREATININE 1.61* 2.57* 2.71* 3.11*  --   CALCIUM 9.6 8.5* 8.6* 8.9  --   MG  --   --   --  2.1 2.2   Liver Function Tests: No results for input(s): AST, ALT, ALKPHOS, BILITOT, PROT, ALBUMIN in the last 168 hours. No results  for input(s): LIPASE, AMYLASE in the last 168 hours. No results for input(s): AMMONIA in the last 168 hours. CBC:  Recent Labs Lab 02/28/15 1407 03/01/15 1122 03/02/15 0155  WBC 16.1* 16.5* 13.3*  HGB 14.7 11.7* 11.7*  HCT 45.0 35.2* 36.1*  MCV 97.0 96.7 96.8  PLT 217 146* 159   Cardiac Enzymes:  Recent Labs Lab 03/01/15 0100 03/01/15 0540 03/01/15 1122  TROPONINI <0.03 <0.03 <0.03   BNP: Invalid input(s): POCBNP CBG:  Recent Labs Lab 03/01/15 1117 03/01/15 1621 03/01/15 2212 03/02/15 0756 03/02/15 1111  GLUCAP 169* 132* 216* 144* 146*    No results found for this or any previous visit (from the past 240 hour(s)).   Studies:              All Imaging reviewed and is as per above notation   Scheduled Meds: . aspirin EC  325 mg Oral Daily  . atorvastatin  40 mg Oral Daily  . furosemide  40 mg Oral Daily  . insulin aspart  0-9 Units Subcutaneous TID WC  . insulin detemir  40 Units Subcutaneous BID  . metoprolol  25 mg Oral 4  times per day  . multivitamin with minerals  1 tablet Oral Daily  . pantoprazole  40 mg Oral Daily  . vitamin B-12  1,000 mcg Oral Daily   Continuous Infusions: . sodium chloride 75 mL/hr at 03/02/15 0753  . heparin 1,250 Units/hr (03/02/15 1059)     Assessment/Plan:  1. pleuritic chest pain with unlikely cardiogenic features-defer to cardiology however not sure if needsfurther ischemic evaluation as normal Lexiscan 03/2014. Await echocardiogram. May be able to try colchicine once labs are back. 2. New onset Afib + RVR-CHad2Vasc2 ~ 3-4 [awaiting echo]-initially on IV Cardizem-change to metoprolol 25 q6 per cardiology.  Magnesium is 2. Check tsh in am.  Heparin --> Coumadin await echo. ?DCCV per cardiology 3. Acute kidney injury  superimposed on baseline stage IIICK D-repeat labs stat. etio= ACE inhibitor,hypotension from hydralazine 10, metoprolol 50 +CT contrast Hyperkalemia may need to be treated if potassium is above 5.5.  Monitor  labs in a.m. might need to also add low-dose IV fluids dependent on repeat labs.  I have discontinued lasix today 10/13 4. Normal stress 2008, normal EF 63% last Myoview 11/15-continue hydralazine 10 daily, metoprolol 50 twice a day. Appreciate cardiology input 5. Hypertension in a setting of relative hypotension-discontinue hydralazine 10, lower rate of metoprolol 50-25 twice a day, Discontinued lisinopril 40 daily 6. Type 2 diabetes mellitus-continue sliding scale coverage 140 ranges. Levemir 40 bid.  wll cntrlled 7. Hyperlipidemia-continue atorvastatin moderate intensity/high intensity dosing 40 mg 8. GERD-continue pantoprazole 40 daily, may need to adjust for renal clearance   fll code Inpatient pending resolution Therapy evals pending   Verneita Griffes, MD  [facility] Pager [phone] 03/02/2015, 1:22 PM    LOS: 2 days

## 2015-03-02 NOTE — Progress Notes (Signed)
03/02/15  Pharmacy- Heparin 2000   HL 0.44 on 1250 units/hr  A/P:  81yo male on Heparin for AFib.  Heparin level is therapeutic on current rate, no bleeding problems per d/w RN.  1-  Continue Heparin 1250 units/hr 2-  Repeat HL 6hr to verify   Gracy Bruins, [facility]

## 2015-03-02 NOTE — Plan of Care (Signed)
Problem: Phase I Progression Outcomes Goal: Voiding-avoid urinary catheter unless indicated Outcome: Completed/Met Date Met:  03/02/15 Patient up to bathroom to void.

## 2015-03-02 NOTE — Progress Notes (Signed)
  Echocardiogram 2D Echocardiogram has been performed.  Justin Leach 03/02/2015, 12:24 PM

## 2015-03-02 NOTE — Progress Notes (Signed)
Cardiology returned page, stated to notify hospitalist since [facility] was primary for patient.  [facility] paged, K. Schorr returned page and was updated, [facility] stated to call Cardiology back for further orders.

## 2015-03-03 DIAGNOSIS — E785 Hyperlipidemia, unspecified: Secondary | ICD-10-CM | POA: Diagnosis not present

## 2015-03-03 DIAGNOSIS — I129 Hypertensive chronic kidney disease with stage 1 through stage 4 chronic kidney disease, or unspecified chronic kidney disease: Secondary | ICD-10-CM | POA: Diagnosis present

## 2015-03-03 DIAGNOSIS — N141 Nephropathy induced by other drugs, medicaments and biological substances: Secondary | ICD-10-CM | POA: Diagnosis not present

## 2015-03-03 DIAGNOSIS — N170 Acute kidney failure with tubular necrosis: Secondary | ICD-10-CM | POA: Diagnosis not present

## 2015-03-03 DIAGNOSIS — I959 Hypotension, unspecified: Secondary | ICD-10-CM | POA: Diagnosis present

## 2015-03-03 DIAGNOSIS — N181 Chronic kidney disease, stage 1: Secondary | ICD-10-CM

## 2015-03-03 DIAGNOSIS — I480 Paroxysmal atrial fibrillation: Secondary | ICD-10-CM | POA: Diagnosis not present

## 2015-03-03 DIAGNOSIS — I10 Essential (primary) hypertension: Secondary | ICD-10-CM | POA: Diagnosis not present

## 2015-03-03 DIAGNOSIS — J9811 Atelectasis: Secondary | ICD-10-CM | POA: Diagnosis present

## 2015-03-03 DIAGNOSIS — I4891 Unspecified atrial fibrillation: Secondary | ICD-10-CM | POA: Diagnosis not present

## 2015-03-03 DIAGNOSIS — E1122 Type 2 diabetes mellitus with diabetic chronic kidney disease: Secondary | ICD-10-CM | POA: Diagnosis not present

## 2015-03-03 DIAGNOSIS — N179 Acute kidney failure, unspecified: Secondary | ICD-10-CM | POA: Diagnosis not present

## 2015-03-03 DIAGNOSIS — R0789 Other chest pain: Secondary | ICD-10-CM | POA: Diagnosis present

## 2015-03-03 DIAGNOSIS — R0781 Pleurodynia: Secondary | ICD-10-CM | POA: Diagnosis not present

## 2015-03-03 DIAGNOSIS — K219 Gastro-esophageal reflux disease without esophagitis: Secondary | ICD-10-CM | POA: Diagnosis present

## 2015-03-03 DIAGNOSIS — Z7982 Long term (current) use of aspirin: Secondary | ICD-10-CM | POA: Diagnosis not present

## 2015-03-03 DIAGNOSIS — N184 Chronic kidney disease, stage 4 (severe): Secondary | ICD-10-CM | POA: Diagnosis present

## 2015-03-03 DIAGNOSIS — E875 Hyperkalemia: Secondary | ICD-10-CM | POA: Diagnosis not present

## 2015-03-03 DIAGNOSIS — T508X5A Adverse effect of diagnostic agents, initial encounter: Secondary | ICD-10-CM | POA: Diagnosis not present

## 2015-03-03 DIAGNOSIS — I5031 Acute diastolic (congestive) heart failure: Secondary | ICD-10-CM | POA: Diagnosis not present

## 2015-03-03 DIAGNOSIS — I2510 Atherosclerotic heart disease of native coronary artery without angina pectoris: Secondary | ICD-10-CM | POA: Diagnosis present

## 2015-03-03 DIAGNOSIS — R079 Chest pain, unspecified: Secondary | ICD-10-CM | POA: Diagnosis not present

## 2015-03-03 DIAGNOSIS — R072 Precordial pain: Secondary | ICD-10-CM

## 2015-03-03 DIAGNOSIS — R071 Chest pain on breathing: Secondary | ICD-10-CM | POA: Diagnosis not present

## 2015-03-03 LAB — COMPREHENSIVE METABOLIC PANEL
ALBUMIN: 2.8 g/dL — AB (ref 3.5–5.0)
ALK PHOS: 86 U/L (ref 38–126)
ALT: 23 U/L (ref 17–63)
ANION GAP: 9 (ref 5–15)
AST: 28 U/L (ref 15–41)
BILIRUBIN TOTAL: 0.3 mg/dL (ref 0.3–1.2)
BUN: 72 mg/dL — AB (ref 6–20)
CALCIUM: 8.8 mg/dL — AB (ref 8.9–10.3)
CO2: 25 mmol/L (ref 22–32)
CREATININE: 3.02 mg/dL — AB (ref 0.61–1.24)
Chloride: 101 mmol/L (ref 101–111)
GFR calc Af Amer: 21 mL/min — ABNORMAL LOW (ref >60)
GFR calc non Af Amer: 18 mL/min — ABNORMAL LOW (ref >60)
GLUCOSE: 81 mg/dL (ref 65–99)
Potassium: 4.8 mmol/L (ref 3.5–5.1)
Sodium: 135 mmol/L (ref 135–145)
TOTAL PROTEIN: 5.4 g/dL — AB (ref 6.5–8.1)

## 2015-03-03 LAB — GLUCOSE, CAPILLARY
GLUCOSE-CAPILLARY: 110 mg/dL — AB (ref 65–99)
GLUCOSE-CAPILLARY: 137 mg/dL — AB (ref 65–99)
GLUCOSE-CAPILLARY: 139 mg/dL — AB (ref 65–99)
Glucose-Capillary: 117 mg/dL — ABNORMAL HIGH (ref 65–99)
Glucose-Capillary: 22 mg/dL — CL (ref 65–99)
Glucose-Capillary: 152 mg/dL — ABNORMAL HIGH (ref 65–99)

## 2015-03-03 LAB — CBC
HEMATOCRIT: 34.9 % — AB (ref 39.0–52.0)
HEMOGLOBIN: 11.2 g/dL — AB (ref 13.0–17.0)
MCH: 30.9 pg (ref 26.0–34.0)
MCHC: 32.1 g/dL (ref 30.0–36.0)
MCV: 96.4 fL (ref 78.0–100.0)
Platelets: 177 10*3/uL (ref 150–400)
RBC: 3.62 MIL/uL — ABNORMAL LOW (ref 4.22–5.81)
RDW: 14.5 % (ref 11.5–15.5)
WBC: 11.4 10*3/uL — ABNORMAL HIGH (ref 4.0–10.5)

## 2015-03-03 LAB — HEPARIN LEVEL (UNFRACTIONATED): HEPARIN UNFRACTIONATED: 0.46 [IU]/mL (ref 0.30–0.70)

## 2015-03-03 MED ORDER — DEXTROSE 50 % IV SOLN
INTRAVENOUS | Status: AC
  Administered 2015-03-03: 50 mL
  Filled 2015-03-03: qty 50

## 2015-03-03 MED ORDER — WARFARIN SODIUM 7.5 MG PO TABS
7.5000 mg | ORAL_TABLET | Freq: Once | ORAL | Status: AC
  Administered 2015-03-03: 7.5 mg via ORAL
  Filled 2015-03-03: qty 1

## 2015-03-03 MED ORDER — LORAZEPAM 2 MG/ML IJ SOLN: 0.5000 mg | Freq: Once | INTRAMUSCULAR | Status: DC

## 2015-03-03 MED ORDER — WARFARIN - PHARMACIST DOSING INPATIENT
Freq: Every day | Status: DC
  Administered 2015-03-03 – 2015-03-07 (×2)

## 2015-03-03 NOTE — Progress Notes (Signed)
ANTICOAGULATION CONSULT NOTE - Follow Up Consult  Pharmacy Consult for Heparin >> Coumadin Indication: atrial fibrillation  Allergies  Allergen Reactions  . Nifedipine Other (See Comments)    Reaction: made gums swell up. Pt states that he had to have gum surgery after taking.     Patient Measurements: Height: 5\' 3"  (160 cm) (per patient report ) Weight: 192 lb 8 oz (87.317 kg) IBW/kg (Calculated) : 56.9 Heparin Dosing Weight: 75 kg  Vital Signs: Temp: 98.2 F (36.8 C) (10/14 1124) Temp Source: Oral (10/14 1124) BP: 146/57 mmHg (10/14 1124) Pulse Rate: 61 (10/14 1124)  Labs:  Recent Labs  03/01/15 0100 03/01/15 0540  03/01/15 1122 03/01/15 1334 03/02/15 0155 03/02/15 1008 03/02/15 1855 03/03/15 0121 03/03/15 0124  HGB  --   --   < > 11.7*  --  11.7*  --   --  11.2*  --   HCT  --   --   --  35.2*  --  36.1*  --   --  34.9*  --   PLT  --   --   --  146*  --  159  --   --  177  --   HEPARINUNFRC  --   --   --   --   --   --  0.27* 0.44  --  0.46  CREATININE  --   --   < > 2.57* 2.71* 3.11*  --   --  3.02*  --   TROPONINI <0.03 <0.03  --  <0.03  --   --   --   --   --   --   < > = values in this interval not displayed.  Estimated Creatinine Clearance: 18.7 mL/min (by C-G formula based on Cr of 3.02).   Medications:  Scheduled:  . aspirin EC  325 mg Oral Daily  . atorvastatin  40 mg Oral Daily  . insulin aspart  0-9 Units Subcutaneous TID WC  . metoprolol  25 mg Oral 4 times per day  . multivitamin with minerals  1 tablet Oral Daily  . pantoprazole  40 mg Oral Daily  . vitamin B-12  1,000 mcg Oral Daily   Infusions:  . sodium chloride 75 mL/hr at 03/03/15 1112  . heparin 1,250 Units/hr (03/02/15 2130)    Assessment: 81 yo M with new PAF on heparin and now starting Coumadin.  Coumadin pts = 5, CHADSVASC score of 4.  Heparin is therapeutic on current rate.  No bleeding noted.  Goal of Therapy:  INR 2-3 Heparin level 0.3-0.7 units/ml Monitor platelets by  anticoagulation protocol: Yes   Plan:  Continue heparin at 1250 units/hr Coumadin 7.5mg  PO x 1 tonight Daily heparin level, CBC, and INR Monitor for s/sx of bleeding  [employer], Pharm.D., BCPS Clinical Pharmacist Pager [phone] 03/03/2015 2:59 PM

## 2015-03-03 NOTE — Evaluation (Signed)
Physical Therapy Evaluation Patient Details Name: Justin Leach MRN: [id number] DOB: 1931/09/06 Today's Date: 03/03/2015   History of Present Illness  Pt is a 81 y/o M who presented to ER 2/2 chest pain.  Went into afib RVR on 03/01/15 which was new for him.  Pt's PMH includes DM II, cervical disc disease, CKD, rotator cuff repair Bil.  Clinical Impression  Pt admitted with above diagnosis. Pt currently with functional limitations due to the deficits listed below (see PT Problem List). Justin Leach will have 24/7 assist available from his wife at d/c and will benefit from HHPT to address balance deficits (see general notes below regarding concern w/ HHPT).  He currently requires close min guard w/ transfers and ambulation and is at a high fall risk 2/2 his instability.  Instructed pt to begin using his cane again at d/c and pt in agreement.  Pt will benefit from skilled PT to increase their independence and safety with mobility to allow discharge to the venue listed below.      Follow Up Recommendations Home health PT;Supervision for mobility/OOB    Equipment Recommendations  None recommended by PT    Recommendations for Other Services OT consult     Precautions / Restrictions Precautions Precautions: Fall Restrictions Weight Bearing Restrictions: No      Mobility  Bed Mobility Overal bed mobility: Modified Independent             General bed mobility comments: Increased time as dypnea noted.  Pt desats to 88% on RA w/ bed mobility and [place] reapplied at 2 LPM for duration of session.  Transfers Overall transfer level: Needs assistance Equipment used: None Transfers: Sit to/from Stand Sit to Stand: Min guard         General transfer comment: Close min guard for pt's safety as mild instability noted w/ sit<>stand.    Ambulation/Gait Ambulation/Gait assistance: Min guard Ambulation Distance (Feet): 150 Feet Assistive device: None Gait Pattern/deviations: Step-through  pattern;Antalgic;Decreased stride length   Gait velocity interpretation: Below normal speed for age/gender General Gait Details: Dec gait speed and pt reaching out for railing in hallway and bed rail in room for support.  Mild instability noted.  Stairs            Wheelchair Mobility    Modified Rankin (Stroke Patients Only)       Balance Overall balance assessment: Needs assistance;History of Falls Sitting-balance support: Bilateral upper extremity supported;Feet supported Sitting balance-Leahy Scale: Fair     Standing balance support: No upper extremity supported;During functional activity Standing balance-Leahy Scale: Poor Standing balance comment: Reaches out for objects around room when standing/ambulating                             Pertinent Vitals/Pain Pain Assessment: No/denies pain    Home Living Family/patient expects to be discharged to:: Private residence Living Arrangements: Spouse/significant other Available Help at Discharge: Family;Available 24 hours/day (wife) Type of Home: House Home Access: Level entry     Home Layout: One level Home Equipment: Walker - standard;Cane - single point      Prior Function Level of Independence: Independent         Comments: Reports fall in July when his "feet got tangled up".  Used a cane for one week following this which he says improved his stability.     Hand Dominance        Extremity/Trunk Assessment   Upper Extremity Assessment:  Overall WFL for tasks assessed           Lower Extremity Assessment: Overall WFL for tasks assessed         Communication   Communication: No difficulties  Cognition Arousal/Alertness: Awake/alert Behavior During Therapy: WFL for tasks assessed/performed Overall Cognitive Status: Within Functional Limits for tasks assessed                      General Comments General comments (skin integrity, edema, etc.): Instability when  standing/ambulating and discussed w/ pt the use of his cane at home, pt agrees to begin using cane again at d/c.  Recommending HHPT although pt has hesitation about the safety of the therapist coming to his neighborhood for visits.  He says he can go to his other house in [place] for therapy sessions or go to [place] if he needs to.    Exercises General Exercises - Lower Extremity Ankle Circles/Pumps: AROM;Both;10 reps;Seated Long Arc Quad: AROM;Both;10 reps;Seated Hip Flexion/Marching: AROM;Both;10 reps;Seated      Assessment/Plan    PT Assessment Patient needs continued PT services  PT Diagnosis Difficulty walking;Abnormality of gait;Generalized weakness   PT Problem List Decreased strength;Decreased activity tolerance;Decreased balance;Decreased mobility;Decreased knowledge of use of DME;Decreased safety awareness;Decreased knowledge of precautions;Cardiopulmonary status limiting activity  PT Treatment Interventions DME instruction;Gait training;Functional mobility training;Therapeutic activities;Therapeutic exercise;Balance training;Neuromuscular re-education;Patient/family education   PT Goals (Current goals can be found in the Care Plan section) Acute Rehab PT Goals Patient Stated Goal: to go home once ready to spend time w/ his wife and dog PT Goal Formulation: With patient Time For Goal Achievement: 03/17/15 Potential to Achieve Goals: Good    Frequency Min 2X/week   Barriers to discharge        Co-evaluation               End of Session Equipment Utilized During Treatment: Gait belt;Oxygen Activity Tolerance: Patient limited by fatigue Patient left: in chair;with call bell/phone within reach Nurse Communication: Mobility status;Precautions    Functional Assessment Tool Used: Clinical Judgement Functional Limitation: Mobility: Walking and moving around Mobility: Walking and Moving Around Current Status (W5462): At least 1 percent but less than 20 percent  impaired, limited or restricted Mobility: Walking and Moving Around Goal Status [phone]): At least 1 percent but less than 20 percent impaired, limited or restricted    Time: 1257-1329 PT Time Calculation (min) (ACUTE ONLY): 32 min   Charges:   PT Evaluation $Initial PT Evaluation Tier I: 1 Procedure PT Treatments $Gait Training: 8-22 mins   PT G Codes:   PT G-Codes **NOT FOR INPATIENT CLASS** Functional Assessment Tool Used: Clinical Judgement Functional Limitation: Mobility: Walking and moving around Mobility: Walking and Moving Around Current Status (K9381): At least 1 percent but less than 20 percent impaired, limited or restricted Mobility: Walking and Moving Around Goal Status [id number]): At least 1 percent but less than 20 percent impaired, limited or restricted   Joslyn Hy PT, DPT [phone] Pager: [phone] 03/03/2015, 1:44 PM

## 2015-03-03 NOTE — Progress Notes (Addendum)
Inpatient Diabetes Program Recommendations  AACE/ADA: New Consensus Statement on Inpatient Glycemic Control (2015)  Target Ranges:  Prepandial:   less than 140 mg/dL      Peak postprandial:   less than 180 mg/dL (1-2 hours)      Critically ill patients:  140 - 180 mg/dL   Review of Glycemic Control  Diabetes history: DM 2 Outpatient Diabetes medications: Levemir 60 units QAM, 50 units QPM, Humalog 10-30 units TID meal coverage Current orders for Inpatient glycemic control: Levemir 40 units BID, Novolog Sensitive TID  Inpatient Diabetes Program Recommendations: Insulin - Basal: Hypoglycemia this am at 22 mg/dl. Please consider decreasing basal insulin to 25 units BID.  Thanks,  Tama Headings RN, MSN, [facility] Inpatient Diabetes Coordinator Team Pager [phone] (8a-5p)

## 2015-03-03 NOTE — Progress Notes (Signed)
Pt BG this am 19, recheck was 22. Patient alert and talking, gave Juice as patient was speaking and alert. RN pulled D50 and gave to patient as he was very confused. Upon recheck, BG 122. Pt now alert and oriented. Am dose insulin held. MD notified.

## 2015-03-03 NOTE — Progress Notes (Signed)
ANTICOAGULATION CONSULT NOTE - Follow Up Consult  Pharmacy Consult for Heparin  Indication: atrial fibrillation  Allergies  Allergen Reactions  . Nifedipine Other (See Comments)    Reaction: made gums swell up. Pt states that he had to have gum surgery after taking.     Patient Measurements: Height: 5\' 3"  (160 cm) (per patient report ) Weight: 189 lb 1.6 oz (85.775 kg) IBW/kg (Calculated) : 56.9  Vital Signs: Temp: 97.8 F (36.6 C) (10/14 0104) Temp Source: Oral (10/14 0104) BP: 129/54 mmHg (10/14 0104) Pulse Rate: 59 (10/14 0104)  Labs:  Recent Labs  03/01/15 0100 03/01/15 0540 03/01/15 1122 03/01/15 1334 03/02/15 0155 03/02/15 1008 03/02/15 1855 03/03/15 0121 03/03/15 0124  HGB  --   --  11.7*  --  11.7*  --   --  11.2*  --   HCT  --   --  35.2*  --  36.1*  --   --  34.9*  --   PLT  --   --  146*  --  159  --   --  177  --   HEPARINUNFRC  --   --   --   --   --  0.27* 0.44  --  0.46  CREATININE  --   --  2.57* 2.71* 3.11*  --   --  3.02*  --   TROPONINI <0.03 <0.03 <0.03  --   --   --   --   --   --     Estimated Creatinine Clearance: 18.6 mL/min (by C-G formula based on Cr of 3.02).   Assessment: Therapeutic heparin level x 2  Goal of Therapy:  Heparin level 0.3-0.7 units/ml Monitor platelets by anticoagulation protocol: Yes   Plan:  -Continue heparin at 1250 units/hr -Daily CBC/HL -Monitor for bleeding  Narda Bonds 03/03/2015,2:38 AM

## 2015-03-03 NOTE — Progress Notes (Signed)
Justin Leach NLG:[id number] DOB: November 12, 1931 DOA: 02/28/2015 PCP: Irven Shelling, MD  Brief narrative: 81 y/o ? Nuclear stress 2008 normal, last Myoview 03/2014 normal with EF of 63% HTN Diabetes mellitus TY II HLD CKD stage III-IV baseline  Admitted to the hospital 02/28/15 retrosternal chest pain persistent and EKG will show nonspecific findings-Cardiac enzymes were negative had CT scan of chest10/11 which did not show anypulmonary embolism  Developed AKI with rising creatining and then flipped into new onset Afib with RVR 10/13  some transient hypoglycemia on 10/14  Past medical history-As per Problem list Chart reviewed as below-   Consultants:  Cardiology  Procedures:    Antibiotics:     Subjective   Fair  Felt poorly yesterday, this am CBG notd to be 22 but now better in 100 rnge after d50 otherwise fair No CP   Objective    Interim History:   Telemetry: sinus   Objective: Filed Vitals:   03/02/15 2040 03/03/15 0104 03/03/15 0436 03/03/15 0500  BP: 106/50 129/54  126/57  Pulse: 70 59  62  Temp: 97.9 F (36.6 C) 97.8 F (36.6 C)  97.7 F (36.5 C)  TempSrc: Oral Oral  Oral  Resp: 24 20  16   Height:      Weight:   87.317 kg (192 lb 8 oz)   SpO2: 96% 99%  91%    Intake/Output Summary (Last 24 hours) at 03/03/15 1021 Last data filed at 03/03/15 0805  Gross per 24 hour  Intake 2828.75 ml  Output    600 ml  Net 2228.75 ml    Exam:  General: eomi ncat-a little tired Cardiovascular: s1 s 2no m/r/g Respiratory: clear no added sound, no rales no rhonchi-cannot appreciate rubs Abdomen:  Soft nt nd no rebound no gaurd Skin intact Neuro moving all 4 limbs ='lly  Data Reviewed: Basic Metabolic Panel:  Recent Labs Lab 02/28/15 1407 03/01/15 1122 03/01/15 1334 03/02/15 0155 03/02/15 1008 03/03/15 0121  NA 140 134* 135 135  --  135  K 4.6 5.3* 4.9 5.0  --  4.8  CL 104 98* 99* 97*  --  101  CO2 30 27 27 26   --  25    GLUCOSE 100* 201* 178* 227*  --  81  BUN 29* 39* 42* 57*  --  72*  CREATININE 1.61* 2.57* 2.71* 3.11*  --  3.02*  CALCIUM 9.6 8.5* 8.6* 8.9  --  8.8*  MG  --   --   --  2.1 2.2  --    Liver Function Tests:  Recent Labs Lab 03/03/15 0121  AST 28  ALT 23  ALKPHOS 86  BILITOT 0.3  PROT 5.4*  ALBUMIN 2.8*   No results for input(s): LIPASE, AMYLASE in the last 168 hours. No results for input(s): AMMONIA in the last 168 hours. CBC:  Recent Labs Lab 02/28/15 1407 03/01/15 1122 03/02/15 0155 03/03/15 0121  WBC 16.1* 16.5* 13.3* 11.4*  HGB 14.7 11.7* 11.7* 11.2*  HCT 45.0 35.2* 36.1* 34.9*  MCV 97.0 96.7 96.8 96.4  PLT 217 146* 159 177   Cardiac Enzymes:  Recent Labs Lab 03/01/15 0100 03/01/15 0540 03/01/15 1122  TROPONINI <0.03 <0.03 <0.03   BNP: Invalid input(s): POCBNP CBG:  Recent Labs Lab 03/02/15 1627 03/02/15 2150 03/03/15 0732 03/03/15 0734 03/03/15 0752  GLUCAP 74 131* 19* 22* 117*    No results found for this or any previous visit (from the past 240 hour(s)).   Studies:  All Imaging reviewed and is as per above notation   Scheduled Meds: . aspirin EC  325 mg Oral Daily  . atorvastatin  40 mg Oral Daily  . insulin aspart  0-9 Units Subcutaneous TID WC  . metoprolol  25 mg Oral 4 times per day  . multivitamin with minerals  1 tablet Oral Daily  . pantoprazole  40 mg Oral Daily  . vitamin B-12  1,000 mcg Oral Daily   Continuous Infusions: . sodium chloride 75 mL/hr at 03/03/15 0600  . heparin 1,250 Units/hr (03/02/15 2130)     Assessment/Plan:  1. pleuritic chest pain with unlikely cardiogenic features-no inpatient CV eval as normal Lexiscan 03/2014. echo May be able to try colchicine once labs are back. 2. New onset Afib + RVR-CHad2Vasc2 ~ 3-4 [awaiting echo]-initially on IV Cardizem-change to metoprolol 25 q6 per cardiology.  Magnesium is 2. Check tsh in am.  Heparin --> Coumadin await echo. ?DCCV per cardiology 3. Acute  hyperkalemia on 10/12-resolved c IVF 4. Acute kidney injury  superimposed on baseline stage IIICKD- PReak Creat 3.11, lowest GFR 20. etio= ACE inhibitor,hypotension from hydralazine 10,  +CT contrast Hyperkalemia   Monitor labs in a.m. Continue IV saline started 10/13 @ 75cc/hr. discontinued lasix  10/13-resume low dose as OP 5. Normal stress 2008, normal EF 63% last Myoview 11/15-continue hydralazine 10 daily, metoprolol 50 twice a day. Appreciate cardiology input 6. Hypertension in a setting of relative hypotension-discontinue hydralazine 10, lower rate of metoprolol 50-25 twice a day, Discontinued lisinopril 40 daily 7. Type 2 diabetes mellitus with hypoglycemia in the 20's-continue sliding scale coverage. Levemir 40--> 10 bid.   8. Hyperlipidemia-continue atorvastatin moderate intensity/high intensity dosing 40 mg 9. GERD-continue pantoprazole 40 daily, may need to adjust for renal clearance   fll code Inpatient pending resolution Therapy rec hh PT  Verneita Griffes, MD  [facility] Pager [phone] 03/03/2015, 10:21 AM    LOS: 3 days

## 2015-03-03 NOTE — Progress Notes (Signed)
PT Cancellation Note  Patient Details Name: Justin Leach MRN: [id number] DOB: [birth year]   Cancelled Treatment:    Reason Eval/Treat Not Completed: Patient declined, no reason specified (Pt eating lunch and politely refuses therapy at this time).  PT will continue to follow acutely and will complete evaluation later today as time permits.  Thank you for this order.  Joslyn Hy PT, DPT [phone] Pager: [phone] 03/03/2015, 11:53 AM

## 2015-03-03 NOTE — Progress Notes (Signed)
Patient Name: Justin Leach Date of Encounter: 03/03/2015   SUBJECTIVE No complaints.  No more CP or Dyspnea; no palpitations.  CURRENT MEDS . aspirin EC  325 mg Oral Daily  . atorvastatin  40 mg Oral Daily  . insulin aspart  0-9 Units Subcutaneous TID WC  . metoprolol  25 mg Oral 4 times per day  . multivitamin with minerals  1 tablet Oral Daily  . pantoprazole  40 mg Oral Daily  . vitamin B-12  1,000 mcg Oral Daily    OBJECTIVE  Filed Vitals:   03/03/15 0104 03/03/15 0436 03/03/15 0500 03/03/15 1124  BP: 129/54  126/57 146/57  Pulse: 59  62 61  Temp: 97.8 F (36.6 C)  97.7 F (36.5 C) 98.2 F (36.8 C)  TempSrc: Oral  Oral Oral  Resp: 20  16 19   Height:      Weight:  192 lb 8 oz (87.317 kg)    SpO2: 99%  91% 100%    Intake/Output Summary (Last 24 hours) at 03/03/15 1418 Last data filed at 03/03/15 1213  Gross per 24 hour  Intake 3791.23 ml  Output    600 ml  Net 3191.23 ml   Filed Weights   03/01/15 0013 03/02/15 0618 03/03/15 0436  Weight: 188 lb (85.276 kg) 189 lb 1.6 oz (85.775 kg) 192 lb 8 oz (87.317 kg)    PHYSICAL EXAM  General: Pleasant, NAD.  Resting comfortably. Neuro: A&O X 3. Moves all extremities spontaneously. Psych: Normal affect. HEENT:  [place]/AT, [place] Neck: Supple without bruits or JVD. Lungs:  Resp regular and unlabored. Lungs with bibasilar rales.  Heart: RRR no s3, s4, or murmurs. Abdomen: Soft, non-tender, non-distended, BS + x 4.  Extremities: No clubbing, cyanosis or edema. DP/PT/Radials 2+ and equal bilaterally.  Accessory Clinical Findings  CBC  Recent Labs  03/02/15 0155 03/03/15 0121  WBC 13.3* 11.4*  HGB 11.7* 11.2*  HCT 36.1* 34.9*  MCV 96.8 96.4  PLT 159 628   Basic Metabolic Panel  Recent Labs  03/02/15 0155 03/02/15 1008 03/03/15 0121  NA 135  --  135  K 5.0  --  4.8  CL 97*  --  101  CO2 26  --  25  GLUCOSE 227*  --  81  BUN 57*  --  72*  CREATININE 3.11*  --  3.02*  CALCIUM 8.9  --  8.8*  MG  2.1 2.2  --    Cardiac Enzymes  Recent Labs  03/01/15 0100 03/01/15 0540 03/01/15 1122  TROPONINI <0.03 <0.03 <0.03   BNP Invalid input(s): POCBNP D-Dimer No results for input(s): DDIMER in the last 72 hours.  TELE Back in NSR - transient brady o/n into 40s  Radiology/Studies No new studies  Echo: Study Conclusions  - Left ventricle: The cavity size was normal. There was severe focal basal hypertrophy of the septum. Systolic function was normal. The estimated ejection fraction was in the range of 55% to 60%. Wall motion was normal; there were no regional wall motion abnormalities. - Mitral valve: Transvalvular velocity was within the normal range.  There was no evidence for stenosis. There was trivial regurgitation. Valve area by continuity equation (using LVOT flow): 2.3 cm^2. - Left atrium: The atrium was normal in size. - Right ventricle: The cavity size was normal. Wall thickness was normal. Systolic function was normal. - Tricuspid valve: There was mild regurgitation.   ASSESSMENT AND PLAN Admitted for sounds like pleuritic chest pain, but unfortunately last night  he went into atrial fibrillation with RVR which is a new diagnosis for him. At this point with worsening renal function, he will likely stay inpatient for a little while longer. He is on IV heparin for anticoagulation and would consider potentially using warfarin given his renal insufficiency at this time. Convert from IV diltiazem to back to his home dose of metoprolol but will do 25 every 6 hours as opposed to 50 twice a day based on borderline blood pressures.   1. Chest pain - Pleuritic; r/o MI - Currently chest pain free. - Patient has atypical and pleuritic component.   - Top x 3 negative. EKG sinus rhythm with 1st AV block, similar to previous EKG.  D-dimer 0.86.  CTA showed No evidence of pulmonary embolism. Small pericardial effusion Coronary atherosclerosis with calcified plaque in the  distribution of the LAD and at the origin of the RCA. Prominent scarring and atelectasis at both lung bases. Suggestion of pulmonary venous hypertension without overt edema The heart is mildly enlarged. CXR with mild lef basilar subsegmental atelectasis.  - Patient had normal Lexiscan 03/2014. Essentially normal Echo.  In light of renal insufficiency , no reason to rush ischemia evaluation -- consider outpatient Myoview. .  - Continue ASA, statin, lopressor -- can consolidate to 50 mg bid. - Lisinopril held due to worsening Scr  2. HTN - BP relatively stable with low DBP - As above  3. HL - Continue statin  4. Acute on CKD, stage III -- I suspect his acute on chronic renal insufficiency is related to combination of hypotension, ACE inhibitor dosing in conjunction with contrast induced nephropathy where there is probably contrast induced nephropathy and ATN. - Avoid nephrotoxic agent. Cr worsen to 3.11. Likely from contrast.  - He is on lasix 40mg  daily at home. CTA showed mall spericardial effusion. Lungs with bibasilar rales.  -Lasix was held yesterday to due low BP. Will discuss with MD. - continue to hold  - Net I/O +1.2L.   5. New onset afib with RVR - now converted back to NSR (occasional Brady to 37's o/n -while sleeping) - CHADSVASC score of 4 (age, HTN, DM). Currently on IV dilt 12ml/hr, rate stable 60-70s. Transiently to low 40s. Discontinue Dilt IV and start metoprolol 25mg  q 6 hours. Likely DCCV tomorrow.  - IV heparin for anticoagulation. Will initiate Warfarin Rx - reluctant to use NOAC in setting of ARF.  Signed,   Leonie Man, M.D., M.S. Interventional Cardiologist   Pager # [phone]

## 2015-03-04 ENCOUNTER — Inpatient Hospital Stay (HOSPITAL_COMMUNITY): Payer: BLUE CROSS/BLUE SHIELD

## 2015-03-04 DIAGNOSIS — R079 Chest pain, unspecified: Secondary | ICD-10-CM

## 2015-03-04 DIAGNOSIS — I5031 Acute diastolic (congestive) heart failure: Secondary | ICD-10-CM

## 2015-03-04 DIAGNOSIS — E785 Hyperlipidemia, unspecified: Secondary | ICD-10-CM

## 2015-03-04 LAB — BASIC METABOLIC PANEL
ANION GAP: 5 (ref 5–15)
BUN: 60 mg/dL — AB (ref 6–20)
CHLORIDE: 110 mmol/L (ref 101–111)
CO2: 24 mmol/L (ref 22–32)
Calcium: 8.7 mg/dL — ABNORMAL LOW (ref 8.9–10.3)
Creatinine, Ser: 2.31 mg/dL — ABNORMAL HIGH (ref 0.61–1.24)
GFR calc Af Amer: 29 mL/min — ABNORMAL LOW (ref >60)
GFR, EST NON AFRICAN AMERICAN: 25 mL/min — AB (ref >60)
GLUCOSE: 119 mg/dL — AB (ref 65–99)
POTASSIUM: 4.9 mmol/L (ref 3.5–5.1)
Sodium: 139 mmol/L (ref 135–145)

## 2015-03-04 LAB — CBC
HEMATOCRIT: 32.6 % — AB (ref 39.0–52.0)
HEMOGLOBIN: 10.4 g/dL — AB (ref 13.0–17.0)
MCH: 31.2 pg (ref 26.0–34.0)
MCHC: 31.9 g/dL (ref 30.0–36.0)
MCV: 97.9 fL (ref 78.0–100.0)
Platelets: 172 10*3/uL (ref 150–400)
RBC: 3.33 MIL/uL — AB (ref 4.22–5.81)
RDW: 14.4 % (ref 11.5–15.5)
WBC: 9.9 10*3/uL (ref 4.0–10.5)

## 2015-03-04 LAB — TSH: TSH: 2.159 u[IU]/mL (ref 0.350–4.500)

## 2015-03-04 LAB — GLUCOSE, CAPILLARY
GLUCOSE-CAPILLARY: 103 mg/dL — AB (ref 65–99)
GLUCOSE-CAPILLARY: 138 mg/dL — AB (ref 65–99)
Glucose-Capillary: 252 mg/dL — ABNORMAL HIGH (ref 65–99)
Glucose-Capillary: 240 mg/dL — ABNORMAL HIGH (ref 65–99)

## 2015-03-04 LAB — HEPARIN LEVEL (UNFRACTIONATED): HEPARIN UNFRACTIONATED: 0.34 [IU]/mL (ref 0.30–0.70)

## 2015-03-04 LAB — PROTIME-INR
INR: 1.14 (ref 0.00–1.49)
PROTHROMBIN TIME: 14.8 s (ref 11.6–15.2)

## 2015-03-04 LAB — BRAIN NATRIURETIC PEPTIDE: B NATRIURETIC PEPTIDE 5: 904.6 pg/mL — AB (ref 0.0–100.0)

## 2015-03-04 MED ORDER — FUROSEMIDE 40 MG PO TABS
40.0000 mg | ORAL_TABLET | Freq: Every day | ORAL | Status: DC
  Administered 2015-03-04 – 2015-03-05 (×2): 40 mg via ORAL
  Filled 2015-03-04 (×2): qty 1

## 2015-03-04 MED ORDER — HEPARIN (PORCINE) IN NACL 100-0.45 UNIT/ML-% IJ SOLN
1250.0000 [IU]/h | INTRAMUSCULAR | Status: DC
  Administered 2015-03-04 – 2015-03-05 (×2): 1250 [IU]/h via INTRAVENOUS
  Filled 2015-03-04 (×3): qty 250

## 2015-03-04 MED ORDER — SODIUM CHLORIDE 0.9 % IV SOLN: INTRAVENOUS | Status: DC

## 2015-03-04 MED ORDER — FUROSEMIDE 10 MG/ML IJ SOLN
40.0000 mg | Freq: Once | INTRAMUSCULAR | Status: AC
  Administered 2015-03-04: 40 mg via INTRAVENOUS
  Filled 2015-03-04: qty 4

## 2015-03-04 MED ORDER — WARFARIN SODIUM 7.5 MG PO TABS
7.5000 mg | ORAL_TABLET | Freq: Once | ORAL | Status: AC
  Administered 2015-03-04: 7.5 mg via ORAL
  Filled 2015-03-04: qty 1

## 2015-03-04 MED ORDER — INSULIN ASPART 100 UNIT/ML ~~LOC~~ SOLN
0.0000 [IU] | Freq: Three times a day (TID) | SUBCUTANEOUS | Status: DC
  Administered 2015-03-05: 2 [IU] via SUBCUTANEOUS
  Administered 2015-03-05 (×2): 3 [IU] via SUBCUTANEOUS
  Administered 2015-03-06: 5 [IU] via SUBCUTANEOUS
  Administered 2015-03-06: 2 [IU] via SUBCUTANEOUS
  Administered 2015-03-06: 3 [IU] via SUBCUTANEOUS
  Administered 2015-03-07: 5 [IU] via SUBCUTANEOUS
  Administered 2015-03-07 (×2): 2 [IU] via SUBCUTANEOUS

## 2015-03-04 MED ORDER — INSULIN ASPART 100 UNIT/ML ~~LOC~~ SOLN
0.0000 [IU] | Freq: Every day | SUBCUTANEOUS | Status: DC
  Administered 2015-03-04 – 2015-03-06 (×3): 2 [IU] via SUBCUTANEOUS

## 2015-03-04 NOTE — Progress Notes (Signed)
SUBJECTIVE: Short of breath and wheezing. No chest pain. Prior chest pain was pleuritic.     Intake/Output Summary (Last 24 hours) at 03/04/15 1422 Last data filed at 03/04/15 1400  Gross per 24 hour  Intake    480 ml  Output    700 ml  Net   -220 ml    Current Facility-Administered Medications  Medication Dose Route Frequency Provider Last Rate Last Dose  . acetaminophen (TYLENOL) tablet 650 mg  650 mg Oral Q4H PRN Rise Patience, MD      . aspirin EC tablet 325 mg  325 mg Oral Daily Rise Patience, MD   325 mg at 03/04/15 1000  . atorvastatin (LIPITOR) tablet 40 mg  40 mg Oral Daily Rise Patience, MD   40 mg at 03/04/15 1000  . furosemide (LASIX) injection 40 mg  40 mg Intravenous Once Herminio Commons, MD      . furosemide (LASIX) tablet 40 mg  40 mg Oral Daily Nita Sells, MD   40 mg at 03/04/15 1022  . heparin ADULT infusion 100 units/mL (25000 units/250 mL)  1,250 Units/hr Intravenous Continuous Nita Sells, MD 12.5 mL/hr at 03/04/15 1009 1,250 Units/hr at 03/04/15 1009  . insulin aspart (novoLOG) injection 0-9 Units  0-9 Units Subcutaneous TID WC Rise Patience, MD   1 Units at 03/04/15 1320  . LORazepam (ATIVAN) injection 0.5 mg  0.5 mg Intravenous Once Dianne Dun, NP   0.5 mg at 03/03/15 2330  . metoprolol tartrate (LOPRESSOR) tablet 25 mg  25 mg Oral 4 times per day Bhavinkumar Bhagat, PA   25 mg at 03/04/15 1319  . morphine 2 MG/ML injection 2 mg  2 mg Intravenous Q2H PRN Rise Patience, MD      . multivitamin with minerals tablet 1 tablet  1 tablet Oral Daily Rise Patience, MD   1 tablet at 03/04/15 1000  . ondansetron (ZOFRAN) injection 4 mg  4 mg Intravenous Q6H PRN Rise Patience, MD   4 mg at 03/02/15 5188  . pantoprazole (PROTONIX) EC tablet 40 mg  40 mg Oral Daily Rise Patience, MD   40 mg at 03/04/15 1000  . vitamin B-12 (CYANOCOBALAMIN) tablet 1,000 mcg  1,000 mcg Oral Daily Rise Patience, MD   1,000 mcg at 03/04/15 1000  . warfarin (COUMADIN) tablet 7.5 mg  7.5 mg Oral ONCE-1800 Roma Schanz, [facility]      . Warfarin - Pharmacist Dosing Inpatient   Does not apply q1800 [place], RPH        Filed Vitals:   03/03/15 2008 03/04/15 0442 03/04/15 1313 03/04/15 1359  BP: 153/57 169/53 159/95   Pulse: 63 67    Temp: 98.3 F (36.8 C) 98.6 F (37 C)  98.7 F (37.1 C)  TempSrc: Oral Oral  Oral  Resp:    20  Height:      Weight:  207 lb (93.895 kg)    SpO2: 97% 90%  92%    PHYSICAL EXAM General: short of breath, not tachypneic. HEENT: Normal. Neck: JVP 10 cmH2O.  Lungs: +expiratory wheezes, prolonged exp phase, diminished sounds at bases. CV: Regular rate and irregular rhythm, normal S1/S2, no S3, no murmur.  Trace pretibial edema.   Abdomen: Soft, nontender, obese, no distention.  Neurologic: Alert and oriented.  Psych: Normal affect. Musculoskeletal: No gross deformities. Extremities: No clubbing or cyanosis.   TELEMETRY: Reviewed telemetry pt  in atrial fibrillation, HR 80's.  LABS: Basic Metabolic Panel:  Recent Labs  03/02/15 0155 03/02/15 1008 03/03/15 0121 03/04/15 0337  NA 135  --  135 139  K 5.0  --  4.8 4.9  CL 97*  --  101 110  CO2 26  --  25 24  GLUCOSE 227*  --  81 119*  BUN 57*  --  72* 60*  CREATININE 3.11*  --  3.02* 2.31*  CALCIUM 8.9  --  8.8* 8.7*  MG 2.1 2.2  --   --    Liver Function Tests:  Recent Labs  03/03/15 0121  AST 28  ALT 23  ALKPHOS 86  BILITOT 0.3  PROT 5.4*  ALBUMIN 2.8*   No results for input(s): LIPASE, AMYLASE in the last 72 hours. CBC:  Recent Labs  03/03/15 0121 03/04/15 0337  WBC 11.4* 9.9  HGB 11.2* 10.4*  HCT 34.9* 32.6*  MCV 96.4 97.9  PLT 177 172   Cardiac Enzymes: No results for input(s): CKTOTAL, CKMB, CKMBINDEX, TROPONINI in the last 72 hours. BNP: Invalid input(s): POCBNP D-Dimer: No results for input(s): DDIMER in the last 72 hours. Hemoglobin A1C: No results  for input(s): HGBA1C in the last 72 hours. Fasting Lipid Panel: No results for input(s): CHOL, HDL, LDLCALC, TRIG, CHOLHDL, LDLDIRECT in the last 72 hours. Thyroid Function Tests:  Recent Labs  03/04/15 1100  TSH 2.159   Anemia Panel: No results for input(s): VITAMINB12, FOLATE, FERRITIN, TIBC, IRON, RETICCTPCT in the last 72 hours.  RADIOLOGY: Dg Chest 2 View  03/04/2015  CLINICAL DATA:  CHF EXAM: CHEST  2 VIEW COMPARISON:  CTA chest dated 02/28/2015 FINDINGS: Cardiomegaly with mild interstitial edema. Small left pleural effusion. Associated patchy left lower lobe opacity, likely atelectasis. Mild degenerative changes of the lower thoracolumbar spine. Cervical spine fixation hardware. Cholecystectomy clips. IMPRESSION: Cardiomegaly with mild interstitial edema. Small left pleural effusion. Associated patchy left lower lobe opacity, likely atelectasis. Electronically Signed   By: Julian Hy M.D.   On: 03/04/2015 14:07   Dg Chest 2 View  02/28/2015  CLINICAL DATA:  Chest pain. EXAM: CHEST  2 VIEW COMPARISON:  November 22, 2014.  March 01, 2014. FINDINGS: The heart size and mediastinal contours are within normal limits. No pneumothorax or pleural effusion is noted. Right lung is clear. Increased left basilar opacity is noted concerning for subsegmental atelectasis. Stable compression deformity of L1 vertebral body is noted consistent with old fracture. IMPRESSION: Mild left basilar subsegmental atelectasis. No other significant abnormality seen in the chest. Electronically Signed   By: Marijo Conception, M.D.   On: 02/28/2015 14:48   Ct Angio Chest Pe W/cm &/or Wo Cm  02/28/2015  CLINICAL DATA:  Mid chest pain and shortness of breath. EXAM: CT ANGIOGRAPHY CHEST WITH CONTRAST TECHNIQUE: Multidetector CT imaging of the chest was performed using the standard protocol during bolus administration of intravenous contrast. Multiplanar CT image reconstructions and MIPs were obtained to evaluate the  vascular anatomy. CONTRAST:  17mL OMNIPAQUE IOHEXOL 350 MG/ML SOLN COMPARISON:  Chest x-ray earlier today P FINDINGS: The pulmonary arteries are adequately opacified. There is no evidence of pulmonary embolism. The thoracic aorta is normal in caliber and shows no evidence of aneurysm or dissection. Mild atherosclerosis is noted of the aortic arch. There is a small amount of pericardial fluid. The heart is mildly enlarged. Calcified plaque is noted in the distribution of the LAD. There also is calcified plaque at the origin of the right coronary  artery. Lungs show evidence of significant scarring at both bases with scattered areas of subsegmental atelectasis. There may be a mild component of pulmonary venous hypertension without overt edema. No pleural fluid is identified. No focal airspace consolidation, pneumothorax or pulmonary nodule. No airway obstruction. No enlarged lymph nodes are seen. Visualized upper abdomen is unremarkable. Bony structures show mild spondylosis of the thoracic spine. Review of the MIP images confirms the above findings. IMPRESSION: 1. No evidence of pulmonary embolism. 2. Small pericardial effusion 3. Coronary atherosclerosis with calcified plaque in the distribution of the LAD and at the origin of the RCA. 4. Prominent scarring and atelectasis at both lung bases. 5. Suggestion of pulmonary venous hypertension without overt edema. The heart is mildly enlarged. Electronically Signed   By: Aletta Edouard M.D.   On: 02/28/2015 17:31      ASSESSMENT AND PLAN: 1. Acute diastolic heart failure: Given 40 mg Lasix by hospitalist. CXR shows vascular congestion. BNP in 900 range. I will give another 40 mg IV Lasix x 1.  2. Essential HTN: BP elevated now, had been hypotensive previously with hydralazine which he takes at home. Will diurese first and then monitor.  3. Chest pain: Pleuritic.  4. New onset atrial fibrillation: Will continue warfarin and heparin, INR subtherapeutic 1.14. No  plans for cardioversion at this time.  5. Hyperlipidemia: On statin therapy.  6. Acute on chronic renal insufficiency: Multifactorial in etiology. Will need continued monitoring given diuretic requirement today.   Kate Sable, M.D., F.A.C.C.

## 2015-03-04 NOTE — Progress Notes (Signed)
Justin Leach [id number]:[id number] DOB: 1931/08/04 DOA: 02/28/2015 PCP: Irven Shelling, MD  Brief narrative:   81 y/o ? Nuclear stress 2008 normal, last Myoview 03/2014 normal with EF of 63% HTN Diabetes mellitus TY II HLD CKD stage III-IV baseline  Admitted to the hospital 02/28/15 retrosternal chest pain persistent and EKG will show nonspecific findings-Cardiac enzymes were negative had CT scan of chest 10/11 which did not show anypulmonary embolism  Developed AKI with rising creatining and then flipped into new onset Afib with RVR 10/13  transient iatrogenic hypoglycemia on 10/14    Past medical history-As per Problem list Chart reviewed as below-   Consultants:  Cardiology  Procedures:    Antibiotics:     Subjective   Audible wheezing at the bedside Feels more short of breath than usual and cannot do usual activity Does not feel swollen Denies chest pain Denies nausea vomiting No fever no cough   Objective    Interim History:   Telemetry: sinus   Objective: Filed Vitals:   03/03/15 1621 03/03/15 1719 03/03/15 2008 03/04/15 0442  BP: 158/46 164/53 153/57 169/53  Pulse: 67 65 63 67  Temp: 99 F (37.2 C)  98.3 F (36.8 C) 98.6 F (37 C)  TempSrc: Oral  Oral Oral  Resp: 19     Height:      Weight:    93.895 kg (207 lb)  SpO2: 91%  97% 90%    Intake/Output Summary (Last 24 hours) at 03/04/15 0936 Last data filed at 03/03/15 1213  Gross per 24 hour  Intake 843.73 ml  Output      0 ml  Net 843.73 ml    Exam:  General: eomi ncat-a little tired Cardiovascular: s1 s 2no m/r/g Respiratory: clear no added sound, crackles and rales posteriorly Abdomen:  Soft nt nd no rebound no gaurd Skin intact Neuro moving all 4 limbs ='lly  Data Reviewed: Basic Metabolic Panel:  Recent Labs Lab 03/01/15 1122 03/01/15 1334 03/02/15 0155 03/02/15 1008 03/03/15 0121 03/04/15 0337  NA 134* 135 135  --  135 139  K 5.3* 4.9 5.0  --  4.8 4.9   CL 98* 99* 97*  --  101 110  CO2 27 27 26   --  25 24  GLUCOSE 201* 178* 227*  --  81 119*  BUN 39* 42* 57*  --  72* 60*  CREATININE 2.57* 2.71* 3.11*  --  3.02* 2.31*  CALCIUM 8.5* 8.6* 8.9  --  8.8* 8.7*  MG  --   --  2.1 2.2  --   --    Liver Function Tests:  Recent Labs Lab 03/03/15 0121  AST 28  ALT 23  ALKPHOS 86  BILITOT 0.3  PROT 5.4*  ALBUMIN 2.8*   No results for input(s): LIPASE, AMYLASE in the last 168 hours. No results for input(s): AMMONIA in the last 168 hours. CBC:  Recent Labs Lab 02/28/15 1407 03/01/15 1122 03/02/15 0155 03/03/15 0121 03/04/15 0337  WBC 16.1* 16.5* 13.3* 11.4* 9.9  HGB 14.7 11.7* 11.7* 11.2* 10.4*  HCT 45.0 35.2* 36.1* 34.9* 32.6*  MCV 97.0 96.7 96.8 96.4 97.9  PLT 217 146* 159 177 172   Cardiac Enzymes:  Recent Labs Lab 03/01/15 0100 03/01/15 0540 03/01/15 1122  TROPONINI <0.03 <0.03 <0.03   BNP: Invalid input(s): POCBNP CBG:  Recent Labs Lab 03/03/15 1023 03/03/15 1123 03/03/15 1620 03/03/15 2019 03/04/15 0738  GLUCAP 152* 110* 137* 139* 103*    No results found for  this or any previous visit (from the past 240 hour(s)).   Studies:              All Imaging reviewed and is as per above notation   Scheduled Meds: . aspirin EC  325 mg Oral Daily  . atorvastatin  40 mg Oral Daily  . insulin aspart  0-9 Units Subcutaneous TID WC  . LORazepam  0.5 mg Intravenous Once  . metoprolol  25 mg Oral 4 times per day  . multivitamin with minerals  1 tablet Oral Daily  . pantoprazole  40 mg Oral Daily  . vitamin B-12  1,000 mcg Oral Daily  . warfarin  7.5 mg Oral ONCE-1800  . Warfarin - Pharmacist Dosing Inpatient   Does not apply q1800   Continuous Infusions: . sodium chloride    . heparin 1,250 Units/hr (03/02/15 2130)     Assessment/Plan:  1. Acute iatrogenic exacerbation of diastolic heart failure-given IV saline in order to resolve AKI.  Hled today.  See below section 2. pleuritic chest pain with unlikely  cardiogenic features-no inpatient CV eval as normal Lexiscan 03/2014. echo May be able to try colchicine once labs are back. 3. New onset Afib + RVR-CHad2Vasc2 ~ 3-4 [awaiting echo]-initially on IV Cardizem-metoprolol 25 every 4-->.  Magnesium is 2. TSH .  Heparin gtt [await DCCV] --> Coumadin per pharmacy. 4. Acute hyperkalemia on 10/12-resolved c IVF 5. Acute kidney injury  superimposed on baseline stage IIICKD- Pa peak Creat 3.11-->2.31. etio= ACE inhibitor,hypotension from hydralazine 10, + CT contrast Hyperkalemia   Monitor labs in a.m. held saline started 10/13 @ 75cc/hr as fluid overload. Restart discontinued 10/13- lasix as by mouth 40 daily  6. Normal stress 2008, normal EF 63% last Myoview 11/15-continue hydralazine 10 daily, metoprolol 50 twice a day. Appreciate cardiology input. 7. Hypertension in a setting of relative hypotension-discontinue hydralazine 10, lower rate of metoprolol 50-25 twice a day, Discontinued lisinopril 40 daily. 8. Type 2 diabetes mellitus with hypoglycemia in the 20's-continue sliding scale coverage. Levemir 40--> 10 bid.  CBG's 80's-100's 9. Hyperlipidemia-continue atorvastatin moderate intensity/high intensity dosing 40 mg 10. GERD-continue pantoprazole 40 daily, may need to adjust for renal clearance   fll code Inpatient pending resolution chf and AKI Might need DCCV per cardiology Still on heparin Gtt.  INr Sub Tx Therapy rec hh PT  Verneita Griffes, MD  [facility] Pager [phone] 03/04/2015, 9:36 AM    LOS: 4 days

## 2015-03-04 NOTE — Progress Notes (Signed)
ANTICOAGULATION CONSULT NOTE - Follow Up Consult  Pharmacy Consult for Heparin >> Coumadin Indication: atrial fibrillation  Allergies  Allergen Reactions  . Nifedipine Other (See Comments)    Reaction: made gums swell up. Pt states that he had to have gum surgery after taking.     Patient Measurements: Height: 5\' 3"  (160 cm) (per patient report ) Weight: 207 lb (93.895 kg) IBW/kg (Calculated) : 56.9 Heparin Dosing Weight: 75 kg  Vital Signs: Temp: 98.6 F (37 C) (10/15 0442) Temp Source: Oral (10/15 0442) BP: 169/53 mmHg (10/15 0442) Pulse Rate: 67 (10/15 0442)  Labs:  Recent Labs  03/01/15 1122  03/02/15 0155  03/02/15 1855 03/03/15 0121 03/03/15 0124 03/04/15 0337  HGB 11.7*  --  11.7*  --   --  11.2*  --  10.4*  HCT 35.2*  --  36.1*  --   --  34.9*  --  32.6*  PLT 146*  --  159  --   --  177  --  172  LABPROT  --   --   --   --   --   --   --  14.8  INR  --   --   --   --   --   --   --  1.14  HEPARINUNFRC  --   --   --   < > 0.44  --  0.46 0.34  CREATININE 2.57*  < > 3.11*  --   --  3.02*  --  2.31*  TROPONINI <0.03  --   --   --   --   --   --   --   < > = values in this interval not displayed.  Estimated Creatinine Clearance: 25.4 mL/min (by C-G formula based on Cr of 2.31).   Medications:  Scheduled:  . aspirin EC  325 mg Oral Daily  . atorvastatin  40 mg Oral Daily  . insulin aspart  0-9 Units Subcutaneous TID WC  . LORazepam  0.5 mg Intravenous Once  . metoprolol  25 mg Oral 4 times per day  . multivitamin with minerals  1 tablet Oral Daily  . pantoprazole  40 mg Oral Daily  . vitamin B-12  1,000 mcg Oral Daily  . warfarin  7.5 mg Oral ONCE-1800  . Warfarin - Pharmacist Dosing Inpatient   Does not apply q1800   Infusions:  . sodium chloride 75 mL/hr at 03/03/15 1112  . heparin 1,250 Units/hr (03/02/15 2130)    Assessment: 81 yo M with new PAF on heparin and now starting Coumadin.  Coumadin pts = 5, CHADSVASC score of 4.  Heparin is  therapeutic on current rate.  No bleeding noted. INR 1.14 after one dose 7.5 mg.  Goal of Therapy:  INR 2-3 Heparin level 0.3-0.7 units/ml Monitor platelets by anticoagulation protocol: Yes   Plan:  Continue heparin at 1250 units/hr Coumadin 7.5mg  PO x 1 tonight Daily heparin level, CBC, and INR Monitor for s/sx of bleeding  Joya San, PharmD Clinical Pharmacy Resident Pager # [phone] 03/04/2015 8:47 AM

## 2015-03-04 NOTE — Progress Notes (Signed)
Pt is having complaints of feeling weak after ambulating to the bathroom. I discussed with pt other options for tolieting. Pt refused the urinal and bedside commode and said he feels more comfortable getting up and using the bathroom. I explained to pt the risk of falling while being weak and pt understands the risk. Pt is resting. Will continue to monitor.   Albertina Senegal E

## 2015-03-04 NOTE — Progress Notes (Signed)
Patient went back into a. Fib after being in normal sinus/ 1 degree HB since 11pm on 10/13. Cardiology was notified. Patient resting comfortably. HR rate controlled and heparin gttp continuing. Patient still instructed to use bedside commode and has been compliant.

## 2015-03-04 NOTE — Progress Notes (Signed)
Patient is refusing urinal and bedside commode. Patient educated that he has heart problems that makes it more likely to have syncopal episode in bathroom and that since the bathroom is too small for nurse, IV pole, and patient to be in same area, he should use a bedside commode since he gets very weak walking back and forth to the restroom and has urinary frequency. RN placed bed alarm on bed, placed call bell in reach, and instructed patient to call for assistance for bedside commode due to safety concern. Charge RN also aware of plan and agree must must use bedside commode because patient is high fall risk and has heparin gttp that will make fall more dangerous if occurs.

## 2015-03-05 DIAGNOSIS — R071 Chest pain on breathing: Secondary | ICD-10-CM

## 2015-03-05 LAB — CBC
HCT: 34.6 % — ABNORMAL LOW (ref 39.0–52.0)
HEMOGLOBIN: 11.3 g/dL — AB (ref 13.0–17.0)
MCH: 31.1 pg (ref 26.0–34.0)
MCHC: 32.7 g/dL (ref 30.0–36.0)
MCV: 95.3 fL (ref 78.0–100.0)
Platelets: 210 10*3/uL (ref 150–400)
RBC: 3.63 MIL/uL — ABNORMAL LOW (ref 4.22–5.81)
RDW: 14.2 % (ref 11.5–15.5)
WBC: 9.6 10*3/uL (ref 4.0–10.5)

## 2015-03-05 LAB — BASIC METABOLIC PANEL
ANION GAP: 7 (ref 5–15)
BUN: 50 mg/dL — ABNORMAL HIGH (ref 6–20)
CALCIUM: 9.6 mg/dL (ref 8.9–10.3)
CHLORIDE: 104 mmol/L (ref 101–111)
CO2: 28 mmol/L (ref 22–32)
Creatinine, Ser: 1.84 mg/dL — ABNORMAL HIGH (ref 0.61–1.24)
GFR calc Af Amer: 38 mL/min — ABNORMAL LOW (ref >60)
GFR calc non Af Amer: 33 mL/min — ABNORMAL LOW (ref >60)
GLUCOSE: 165 mg/dL — AB (ref 65–99)
Potassium: 4.8 mmol/L (ref 3.5–5.1)
Sodium: 139 mmol/L (ref 135–145)

## 2015-03-05 LAB — GLUCOSE, CAPILLARY
Glucose-Capillary: 159 mg/dL — ABNORMAL HIGH (ref 65–99)
Glucose-Capillary: 231 mg/dL — ABNORMAL HIGH (ref 65–99)
Glucose-Capillary: 230 mg/dL — ABNORMAL HIGH (ref 65–99)
Glucose-Capillary: 215 mg/dL — ABNORMAL HIGH (ref 65–99)

## 2015-03-05 LAB — HEPARIN LEVEL (UNFRACTIONATED): Heparin Unfractionated: 0.34 IU/mL (ref 0.30–0.70)

## 2015-03-05 LAB — PROTIME-INR
INR: 1.48 (ref 0.00–1.49)
PROTHROMBIN TIME: 18 s — AB (ref 11.6–15.2)

## 2015-03-05 MED ORDER — METOPROLOL TARTRATE 25 MG PO TABS
25.0000 mg | ORAL_TABLET | Freq: Once | ORAL | Status: AC
  Administered 2015-03-05: 25 mg via ORAL
  Filled 2015-03-05: qty 1

## 2015-03-05 MED ORDER — FUROSEMIDE 10 MG/ML IJ SOLN
80.0000 mg | Freq: Once | INTRAMUSCULAR | Status: AC
  Administered 2015-03-05: 80 mg via INTRAVENOUS
  Filled 2015-03-05: qty 8

## 2015-03-05 MED ORDER — INSULIN DETEMIR 100 UNIT/ML ~~LOC~~ SOLN
10.0000 [IU] | Freq: Every day | SUBCUTANEOUS | Status: DC
  Administered 2015-03-05 – 2015-03-06 (×2): 10 [IU] via SUBCUTANEOUS
  Filled 2015-03-05 (×3): qty 0.1

## 2015-03-05 MED ORDER — FUROSEMIDE 10 MG/ML IJ SOLN: 60.0000 mg | Freq: Two times a day (BID) | INTRAMUSCULAR | Status: DC

## 2015-03-05 MED ORDER — METOPROLOL TARTRATE 50 MG PO TABS
50.0000 mg | ORAL_TABLET | Freq: Two times a day (BID) | ORAL | Status: DC
  Administered 2015-03-05 – 2015-03-07 (×4): 50 mg via ORAL
  Filled 2015-03-05 (×4): qty 1

## 2015-03-05 MED ORDER — FUROSEMIDE 10 MG/ML IJ SOLN
80.0000 mg | Freq: Two times a day (BID) | INTRAMUSCULAR | Status: DC
  Administered 2015-03-05: 80 mg via INTRAVENOUS
  Filled 2015-03-05: qty 8

## 2015-03-05 MED ORDER — FUROSEMIDE 10 MG/ML IJ SOLN: 80.0000 mg | Freq: Two times a day (BID) | INTRAMUSCULAR | Status: DC

## 2015-03-05 MED ORDER — WARFARIN SODIUM 5 MG PO TABS
5.0000 mg | ORAL_TABLET | Freq: Once | ORAL | Status: AC
  Administered 2015-03-05: 5 mg via ORAL
  Filled 2015-03-05: qty 1

## 2015-03-05 NOTE — Progress Notes (Signed)
ANTICOAGULATION CONSULT NOTE - Follow Up Consult  Pharmacy Consult for Heparin >> Coumadin Indication: atrial fibrillation  Allergies  Allergen Reactions  . Nifedipine Other (See Comments)    Reaction: made gums swell up. Pt states that he had to have gum surgery after taking.     Patient Measurements: Height: 5\' 3"  (160 cm) (per patient report ) Weight: 191 lb 12.8 oz (87 kg) IBW/kg (Calculated) : 56.9 Heparin Dosing Weight: 75 kg  Vital Signs: Temp: 98.3 F (36.8 C) (10/16 0538) Temp Source: Axillary (10/16 0538) BP: 163/61 mmHg (10/16 0538) Pulse Rate: 62 (10/16 0538)  Labs:  Recent Labs  03/03/15 0121 03/03/15 0124 03/04/15 0337 03/05/15 0338  HGB 11.2*  --  10.4* 11.3*  HCT 34.9*  --  32.6* 34.6*  PLT 177  --  172 210  LABPROT  --   --  14.8 18.0*  INR  --   --  1.14 1.48  HEPARINUNFRC  --  0.46 0.34 0.34  CREATININE 3.02*  --  2.31*  --     Estimated Creatinine Clearance: 24.4 mL/min (by C-G formula based on Cr of 2.31).   Medications:  Scheduled:  . aspirin EC  325 mg Oral Daily  . atorvastatin  40 mg Oral Daily  . furosemide  40 mg Oral Daily  . insulin aspart  0-5 Units Subcutaneous QHS  . insulin aspart  0-9 Units Subcutaneous TID WC  . LORazepam  0.5 mg Intravenous Once  . metoprolol  25 mg Oral 4 times per day  . multivitamin with minerals  1 tablet Oral Daily  . pantoprazole  40 mg Oral Daily  . vitamin B-12  1,000 mcg Oral Daily  . Warfarin - Pharmacist Dosing Inpatient   Does not apply q1800   Infusions:  . heparin 1,250 Units/hr (03/04/15 1649)    Assessment: 81 yo M with new PAF on heparin and now starting Coumadin.  Coumadin pts = 5, CHADSVASC score of 4.  Heparin remains therapeutic on current rate.  No bleeding noted. INR 1.48 after two doses of 7.5 mg.  Goal of Therapy:  INR 2-3 Heparin level 0.3-0.7 units/ml Monitor platelets by anticoagulation protocol: Yes   Plan:  Continue heparin at 1250 units/hr Coumadin 5 mg PO x 1  tonight Daily heparin level, CBC, and INR Monitor for s/sx of bleeding  Joya San, PharmD Clinical Pharmacy Resident Pager # [phone] 03/05/2015 7:46 AM

## 2015-03-05 NOTE — Progress Notes (Addendum)
Patient Name: Justin Leach Date of Encounter: 03/05/2015  Primary Cardiologist Dr. Casimer Lanius (@ [facility])   Principal Problem:   Chest pain Active Problems:   Hypertension   Renal failure (ARF), acute on chronic (H[facility])   Hyperlipidemia   Diabetes mellitus type 2, controlled ([place])   Pleuritic chest pain   Atrial fibrillation, new onset ([place])   Acute diastolic heart failure ([place])    SUBJECTIVE  Denies any CP or SOB. On 4L [place]  CURRENT MEDS . aspirin EC  325 mg Oral Daily  . atorvastatin  40 mg Oral Daily  . furosemide  40 mg Oral Daily  . insulin aspart  0-5 Units Subcutaneous QHS  . insulin aspart  0-9 Units Subcutaneous TID WC  . LORazepam  0.5 mg Intravenous Once  . metoprolol  25 mg Oral 4 times per day  . multivitamin with minerals  1 tablet Oral Daily  . pantoprazole  40 mg Oral Daily  . vitamin B-12  1,000 mcg Oral Daily  . warfarin  5 mg Oral ONCE-1800  . Warfarin - Pharmacist Dosing Inpatient   Does not apply q1800    OBJECTIVE  Filed Vitals:   03/04/15 2033 03/04/15 2342 03/05/15 0436 03/05/15 0538  BP: 158/56 158/67 159/63 163/61  Pulse:  80 75 62  Temp: 98.4 F (36.9 C)  99.2 F (37.3 C) 98.3 F (36.8 C)  TempSrc: Oral  Axillary Axillary  Resp:      Height:      Weight:   191 lb 12.8 oz (87 kg)   SpO2: 93%  99% 100%    Intake/Output Summary (Last 24 hours) at 03/05/15 1020 Last data filed at 03/05/15 0544  Gross per 24 hour  Intake    480 ml  Output   3350 ml  Net  -2870 ml   Filed Weights   03/03/15 0436 03/04/15 0442 03/05/15 0436  Weight: 192 lb 8 oz (87.317 kg) 207 lb (93.895 kg) 191 lb 12.8 oz (87 kg)    PHYSICAL EXAM  General: Pleasant, NAD. Neuro: Alert and oriented X 3. Moves all extremities spontaneously. Psych: Normal affect. HEENT:  Normal  Neck: Supple without bruits or JVD. Lungs:  Resp regular and unlabored. Markedly diminished breath sound with bilateral wheezing.   Wheezing and rales   Heart: RRR no s3, s4, or  murmurs. Abdomen: Soft, non-tender, non-distended, BS + x 4.  Extremities: No clubbing, cyanosis. DP/PT/Radials 2+ and equal bilaterally. Trace edema  Accessory Clinical Findings  CBC  Recent Labs  03/04/15 0337 03/05/15 0338  WBC 9.9 9.6  HGB 10.4* 11.3*  HCT 32.6* 34.6*  MCV 97.9 95.3  PLT 172 093   Basic Metabolic Panel  Recent Labs  03/04/15 0337 03/05/15 0820  NA 139 139  K 4.9 4.8  CL 110 104  CO2 24 28  GLUCOSE 119* 165*  BUN 60* 50*  CREATININE 2.31* 1.84*  CALCIUM 8.7* 9.6   Liver Function Tests  Recent Labs  03/03/15 0121  AST 28  ALT 23  ALKPHOS 86  BILITOT 0.3  PROT 5.4*  ALBUMIN 2.8*   Thyroid Function Tests  Recent Labs  03/04/15 1100  TSH 2.159    TELE afib converted to NSR overnight    ECG  No new EKG  Echocardiogram 03/02/2015  LV EF: 55% -  60%  ------------------------------------------------------------------- Indications:   Chest pain 786.51.  ------------------------------------------------------------------- History:  PMH:  Syncope and dyspnea. Atrial fibrillation. Risk factors: Hypertension. Diabetes mellitus.  ------------------------------------------------------------------- Study  Conclusions  - Left ventricle: The cavity size was normal. There was severe focal basal hypertrophy of the septum. Systolic function was normal. The estimated ejection fraction was in the range of 55% to 60%. Wall motion was normal; there were no regional wall motion abnormalities. - Mitral valve: Transvalvular velocity was within the normal range. There was no evidence for stenosis. There was trivial regurgitation. Valve area by continuity equation (using LVOT flow): 2.3 cm^2. - Left atrium: The atrium was normal in size. - Right ventricle: The cavity size was normal. Wall thickness was normal. Systolic function was normal. - Tricuspid valve: There was mild regurgitation.      Radiology/Studies  Dg Chest 2 View  03/04/2015  CLINICAL DATA:  CHF EXAM: CHEST  2 VIEW COMPARISON:  CTA chest dated 02/28/2015 FINDINGS: Cardiomegaly with mild interstitial edema. Small left pleural effusion. Associated patchy left lower lobe opacity, likely atelectasis. Mild degenerative changes of the lower thoracolumbar spine. Cervical spine fixation hardware. Cholecystectomy clips. IMPRESSION: Cardiomegaly with mild interstitial edema. Small left pleural effusion. Associated patchy left lower lobe opacity, likely atelectasis. Electronically Signed   By: Julian Hy M.D.   On: 03/04/2015 14:07   Dg Chest 2 View  02/28/2015  CLINICAL DATA:  Chest pain. EXAM: CHEST  2 VIEW COMPARISON:  November 22, 2014.  March 01, 2014. FINDINGS: The heart size and mediastinal contours are within normal limits. No pneumothorax or pleural effusion is noted. Right lung is clear. Increased left basilar opacity is noted concerning for subsegmental atelectasis. Stable compression deformity of L1 vertebral body is noted consistent with old fracture. IMPRESSION: Mild left basilar subsegmental atelectasis. No other significant abnormality seen in the chest. Electronically Signed   By: Marijo Conception, M.D.   On: 02/28/2015 14:48   Ct Angio Chest Pe W/cm &/or Wo Cm  02/28/2015  CLINICAL DATA:  Mid chest pain and shortness of breath. EXAM: CT ANGIOGRAPHY CHEST WITH CONTRAST TECHNIQUE: Multidetector CT imaging of the chest was performed using the standard protocol during bolus administration of intravenous contrast. Multiplanar CT image reconstructions and MIPs were obtained to evaluate the vascular anatomy. CONTRAST:  48mL OMNIPAQUE IOHEXOL 350 MG/ML SOLN COMPARISON:  Chest x-ray earlier today P FINDINGS: The pulmonary arteries are adequately opacified. There is no evidence of pulmonary embolism. The thoracic aorta is normal in caliber and shows no evidence of aneurysm or dissection. Mild atherosclerosis is noted of  the aortic arch. There is a small amount of pericardial fluid. The heart is mildly enlarged. Calcified plaque is noted in the distribution of the LAD. There also is calcified plaque at the origin of the right coronary artery. Lungs show evidence of significant scarring at both bases with scattered areas of subsegmental atelectasis. There may be a mild component of pulmonary venous hypertension without overt edema. No pleural fluid is identified. No focal airspace consolidation, pneumothorax or pulmonary nodule. No airway obstruction. No enlarged lymph nodes are seen. Visualized upper abdomen is unremarkable. Bony structures show mild spondylosis of the thoracic spine. Review of the MIP images confirms the above findings. IMPRESSION: 1. No evidence of pulmonary embolism. 2. Small pericardial effusion 3. Coronary atherosclerosis with calcified plaque in the distribution of the LAD and at the origin of the RCA. 4. Prominent scarring and atelectasis at both lung bases. 5. Suggestion of pulmonary venous hypertension without overt edema. The heart is mildly enlarged. Electronically Signed   By: Aletta Edouard M.D.   On: 02/28/2015 17:31    ASSESSMENT AND PLAN  1. Acute diastolic HF  - Echo 16/02/9603 EF 55-60%, no RWMA, mild TR  - currently on PO lasix, bilateral wheezing, but no obvious rale.   2. HTN  3. New onset atrial fibrillation - currently in NSR  - CHADSVASC score of 4 (age, HTN, DM).  - will consolidate to 50mg  BID of metoprolol. Has been converting in and out of afib, came out of afib again overnight. Likely exacerbated by breathing issue as well  - continue coumadin. INR 1.5, goal 2.0-3.0   4. Pleuritic CP: pericarditis, costochondritis vs MSK  5. HLD   6. Acute on chronic renal insufficiency: peaked at 3, currently improving 1.84  7. Wheezing: denies ever smoked in the past, no prior diagnosis of COPD and asthma. Audible wheezing on exam with decreased breath sound throughout and  prolonged expiratory phase.   - unclear what's the cause, currently on metoprolol tartrate which is selective BB, if symptom worsen, may consider bisoprolol.   Hilbert Corrigan PA-C Pager: [phone]  Pt seen and examined  I agree with findings as noted by Janan Ridge above. On my exam he is in SR He has some wheezing and rales  I would switch to IV lasix.   Continue coumadin load.    Dorris Carnes

## 2015-03-05 NOTE — Progress Notes (Addendum)
The patient converted from rate controlled a.fib into NSR on his own at around 5:30 am. EKG obtained to confirm. MD aware. Patient resting comfortably. Will continue to monitor.

## 2015-03-05 NOTE — Progress Notes (Signed)
Rogue Bussing, NP notified of patient change: new onset fine crackles bilateral bases. Pt c/o audible wheeze but not heard on auscultation. Pt resting comfortably in bed on 4L O2 via nasal cannula.

## 2015-03-05 NOTE — Progress Notes (Signed)
Justin Leach MOL:[id number] DOB: 29-May-1931 DOA: 02/28/2015 PCP: Irven Shelling, MD  Brief narrative:   81 y/o ? Nuclear stress 2008 normal, last Myoview 03/2014 normal with EF of 63% HTN Diabetes mellitus TY II HLD CKD stage III-IV baseline  Admitted to the hospital 02/28/15 retrosternal chest pain persistent and EKG will show nonspecific findings-Cardiac enzymes were negative had CT scan of chest 10/11 which did not show anypulmonary embolism  Developed AKI with rising creatining and then flipped into new onset Afib with RVR 10/13  transient iatrogenic hypoglycemia on 10/14    Past medical history-As per Problem list Chart reviewed as below-   Consultants:  Cardiology  Procedures:    Antibiotics:     Subjective   Wheeze better but still a little sob tol diet Overall still weak No n/v No F/chills    Objective    Interim History:   Telemetry: sinus   Objective: Filed Vitals:   03/05/15 0436 03/05/15 0538 03/05/15 1130 03/05/15 1403  BP: 159/63 163/61  152/54  Pulse: 75 62    Temp: 99.2 F (37.3 C) 98.3 F (36.8 C)  98.2 F (36.8 C)  TempSrc: Axillary Axillary  Oral  Resp:    20  Height:      Weight: 87 kg (191 lb 12.8 oz)     SpO2: 99% 100% 97% 94%    Intake/Output Summary (Last 24 hours) at 03/05/15 1441 Last data filed at 03/05/15 1400  Gross per 24 hour  Intake    480 ml  Output   4150 ml  Net  -3670 ml    Exam:  General: eomi ncat-fair Cardiovascular: s1 s 2no m/r/g Respiratory: bilateral post wheezy Abdomen:  Soft nt nd no rebound no gaurd Skin intact Neuro moving all 4 limbs ='lly  Data Reviewed: Basic Metabolic Panel:  Recent Labs Lab 03/01/15 1334 03/02/15 0155 03/02/15 1008 03/03/15 0121 03/04/15 0337 03/05/15 0820  NA 135 135  --  135 139 139  K 4.9 5.0  --  4.8 4.9 4.8  CL 99* 97*  --  101 110 104  CO2 27 26  --  25 24 28   GLUCOSE 178* 227*  --  81 119* 165*  BUN 42* 57*  --  72* 60* 50*    CREATININE 2.71* 3.11*  --  3.02* 2.31* 1.84*  CALCIUM 8.6* 8.9  --  8.8* 8.7* 9.6  MG  --  2.1 2.2  --   --   --    Liver Function Tests:  Recent Labs Lab 03/03/15 0121  AST 28  ALT 23  ALKPHOS 86  BILITOT 0.3  PROT 5.4*  ALBUMIN 2.8*   No results for input(s): LIPASE, AMYLASE in the last 168 hours. No results for input(s): AMMONIA in the last 168 hours. CBC:  Recent Labs Lab 03/01/15 1122 03/02/15 0155 03/03/15 0121 03/04/15 0337 03/05/15 0338  WBC 16.5* 13.3* 11.4* 9.9 9.6  HGB 11.7* 11.7* 11.2* 10.4* 11.3*  HCT 35.2* 36.1* 34.9* 32.6* 34.6*  MCV 96.7 96.8 96.4 97.9 95.3  PLT 146* 159 177 172 210   Cardiac Enzymes:  Recent Labs Lab 03/01/15 0100 03/01/15 0540 03/01/15 1122  TROPONINI <0.03 <0.03 <0.03   BNP: Invalid input(s): POCBNP CBG:  Recent Labs Lab 03/04/15 1157 03/04/15 1615 03/04/15 2035 03/05/15 0746 03/05/15 1208  GLUCAP 138* 252* 240* 159* 231*    No results found for this or any previous visit (from the past 240 hour(s)).   Studies:  All Imaging reviewed and is as per above notation   Scheduled Meds: . aspirin EC  325 mg Oral Daily  . atorvastatin  40 mg Oral Daily  . furosemide  80 mg Intravenous Q12H  . insulin aspart  0-5 Units Subcutaneous QHS  . insulin aspart  0-9 Units Subcutaneous TID WC  . LORazepam  0.5 mg Intravenous Once  . metoprolol tartrate  50 mg Oral BID  . multivitamin with minerals  1 tablet Oral Daily  . pantoprazole  40 mg Oral Daily  . vitamin B-12  1,000 mcg Oral Daily  . warfarin  5 mg Oral ONCE-1800  . Warfarin - Pharmacist Dosing Inpatient   Does not apply q1800   Continuous Infusions: . heparin 1,250 Units/hr (03/04/15 1649)     Assessment/Plan:  1. Acute iatrogenic exacerbation of diastolic heart failure-initially IV saline in order to resolve AKI.  Echo 55-60%.  See below section 2. pleuritic chest pain with unlikely cardiogenic features-no inpatient CV eval as normal Lexiscan  03/2014.  3. New onset Afib + RVR-CHad2Vasc2 ~ 3-4-inconsistently maintaining NSR -initially on IV Cardizem-metoprolol 25 every 4 changed to 50 bid by Cardiology 10/16.  Magnesium is 2. TSH nl.  Heparin gtt --> Coumadin per pharmacy in case wishing to Cardiovert should Afib recur 4. Acute hyperkalemia on 10/12-resolved c IVF 5. Acute kidney injury  superimposed on baseline stage IIICKD- Pa peak Creat 3.11-->2.31-->.1.8 etio= ACE inhibitor,hypotension from hydralazine 10, + CT contrast Hyperkalemia.  held saline started 10/13 @ 75cc/hr as fluid overload. Restart discontinued 10/13- lasix changed 40 po-->> iv 80 bid 10/16 given #1  6. Normal stress 2008,  last Myoview nl 11/15-.Appreciate cardiology input. 7. Hypertension in a setting of relative hypotension-discontinued hydralazine 10,  Discontinued lisinopril 40 daily-see above 8. Type 2 diabetes mellitus with hypoglycemia in the 20's-continue sliding scale coverage.  CBG's 159-231 Levemir 40 was d/c-reimplement 10 units qhs 9. Hyperlipidemia-continue atorvastatin moderate intensity/high intensity dosing 40 mg 10. GERD-continue pantoprazole 40 daily, may need to adjust for renal clearance   fll code Inpatient pending resolution chf and AKI Still on heparin Gtt.  INr Sub Tx Therapy rec hh PT No family +m to update  Verneita Griffes, MD  [facility] Pager [phone] 03/05/2015, 2:41 PM    LOS: 5 days

## 2015-03-06 LAB — GLUCOSE, CAPILLARY
GLUCOSE-CAPILLARY: 164 mg/dL — AB (ref 65–99)
GLUCOSE-CAPILLARY: 224 mg/dL — AB (ref 65–99)
Glucose-Capillary: 19 mg/dL — CL (ref 65–99)
Glucose-Capillary: 260 mg/dL — ABNORMAL HIGH (ref 65–99)
Glucose-Capillary: 234 mg/dL — ABNORMAL HIGH (ref 65–99)

## 2015-03-06 LAB — BASIC METABOLIC PANEL
ANION GAP: 13 (ref 5–15)
BUN: 53 mg/dL — AB (ref 6–20)
CALCIUM: 9.9 mg/dL (ref 8.9–10.3)
CO2: 30 mmol/L (ref 22–32)
Chloride: 96 mmol/L — ABNORMAL LOW (ref 101–111)
Creatinine, Ser: 2.14 mg/dL — ABNORMAL HIGH (ref 0.61–1.24)
GFR calc Af Amer: 32 mL/min — ABNORMAL LOW (ref >60)
GFR, EST NON AFRICAN AMERICAN: 27 mL/min — AB (ref >60)
GLUCOSE: 167 mg/dL — AB (ref 65–99)
Potassium: 4.3 mmol/L (ref 3.5–5.1)
Sodium: 139 mmol/L (ref 135–145)

## 2015-03-06 LAB — HEPARIN LEVEL (UNFRACTIONATED): HEPARIN UNFRACTIONATED: 0.43 [IU]/mL (ref 0.30–0.70)

## 2015-03-06 LAB — PROTIME-INR
INR: 2.23 — ABNORMAL HIGH (ref 0.00–1.49)
Prothrombin Time: 24.5 seconds — ABNORMAL HIGH (ref 11.6–15.2)

## 2015-03-06 MED ORDER — AMIODARONE HCL 200 MG PO TABS
400.0000 mg | ORAL_TABLET | Freq: Two times a day (BID) | ORAL | Status: DC
  Administered 2015-03-06 – 2015-03-07 (×3): 400 mg via ORAL
  Filled 2015-03-06 (×2): qty 2

## 2015-03-06 MED ORDER — FUROSEMIDE 80 MG PO TABS
80.0000 mg | ORAL_TABLET | Freq: Two times a day (BID) | ORAL | Status: DC
  Administered 2015-03-06: 80 mg via ORAL
  Filled 2015-03-06: qty 1

## 2015-03-06 MED ORDER — WARFARIN SODIUM 1 MG PO TABS
1.0000 mg | ORAL_TABLET | Freq: Once | ORAL | Status: AC
  Administered 2015-03-06: 1 mg via ORAL
  Filled 2015-03-06: qty 1

## 2015-03-06 MED ORDER — WARFARIN VIDEO
Freq: Once | Status: AC
  Administered 2015-03-06: 1

## 2015-03-06 MED ORDER — FUROSEMIDE 80 MG PO TABS
80.0000 mg | ORAL_TABLET | Freq: Every day | ORAL | Status: DC
  Administered 2015-03-07: 80 mg via ORAL
  Filled 2015-03-06: qty 1

## 2015-03-06 MED ORDER — COUMADIN BOOK
Freq: Once | Status: AC
  Administered 2015-03-06: 1
  Filled 2015-03-06: qty 1

## 2015-03-06 NOTE — Progress Notes (Signed)
Inpatient Diabetes Program Recommendations  AACE/ADA: New Consensus Statement on Inpatient Glycemic Control (2015)  Target Ranges:  Prepandial:   less than 140 mg/dL      Peak postprandial:   less than 180 mg/dL (1-2 hours)      Critically ill patients:  140 - 180 mg/dL   Review of Glycemic Control  Glucose levels are high. Please consider increase in basal levemir to 20-25 units (Home levemir is 40 units am and 50 units pm)  Thank you Rosita Kea, RN, MSN, CDE  Diabetes Inpatient Program Office: [phone] Pager: [phone] 8:00 am to 5:00 pm

## 2015-03-06 NOTE — Progress Notes (Signed)
UR Completed Nylia Gavina Graves-Bigelow, RN,BSN [phone]  

## 2015-03-06 NOTE — Progress Notes (Signed)
NAS WAFER IRS:[id number] DOB: [birth year] DOA: 02/28/2015 PCP: Irven Shelling, MD  Brief narrative:   81 y/o ? Nuclear stress 2008 normal, last Myoview 03/2014 normal with EF of 63% HTN Diabetes mellitus TY II HLD CKD stage III-IV baseline  Admitted to the hospital 02/28/15 retrosternal chest pain persistent and EKG will show nonspecific findings-Cardiac enzymes were negative had CT scan of chest 10/11 which did not show anypulmonary embolism  Developed AKI with rising creatining  Developed new onset paroxysmal Afib with RVR 10/13 and plan was initially to cardiovert  transient iatrogenic hypoglycemia on 10/14    Past medical history-As per Problem list Chart reviewed as below-   Consultants:  Cardiology  Procedures:    Antibiotics:     Subjective   Wheezing is much better Cardiology planning on cardiac cath in a.m. patient little bit scared No nausea no vomiting Ambulatory No oxygen requirement No current chest pain   Objective    Interim History:   Telemetry: sinus   Objective: Filed Vitals:   03/05/15 2135 03/06/15 0541 03/06/15 1033 03/06/15 1356  BP: 186/55 178/78 155/78 134/59  Pulse: 75 63 110 64  Temp: 97.8 F (36.6 C) 98.5 F (36.9 C)  98.8 F (37.1 C)  TempSrc: Oral   Oral  Resp: 18 24  20   Height:      Weight:  83.598 kg (184 lb 4.8 oz)    SpO2: 95% 98%  96%    Intake/Output Summary (Last 24 hours) at 03/06/15 1502 Last data filed at 03/06/15 1356  Gross per 24 hour  Intake    960 ml  Output   4901 ml  Net  -3941 ml    Exam:  General: eomi ncat-fair Cardiovascular: s1 s 2no m/r/g Respiratory: Clinically clear Abdomen:  Soft nt nd no rebound no gaurd Skin intact Neuro moving all 4 limbs ='lly  Data Reviewed: Basic Metabolic Panel:  Recent Labs Lab 03/02/15 0155 03/02/15 1008 03/03/15 0121 03/04/15 0337 03/05/15 0820 03/06/15 0344  NA 135  --  135 139 139 139  K 5.0  --  4.8 4.9 4.8 4.3  CL  97*  --  101 110 104 96*  CO2 26  --  25 24 28 30   GLUCOSE 227*  --  81 119* 165* 167*  BUN 57*  --  72* 60* 50* 53*  CREATININE 3.11*  --  3.02* 2.31* 1.84* 2.14*  CALCIUM 8.9  --  8.8* 8.7* 9.6 9.9  MG 2.1 2.2  --   --   --   --    Liver Function Tests:  Recent Labs Lab 03/03/15 0121  AST 28  ALT 23  ALKPHOS 86  BILITOT 0.3  PROT 5.4*  ALBUMIN 2.8*   No results for input(s): LIPASE, AMYLASE in the last 168 hours. No results for input(s): AMMONIA in the last 168 hours. CBC:  Recent Labs Lab 03/01/15 1122 03/02/15 0155 03/03/15 0121 03/04/15 0337 03/05/15 0338  WBC 16.5* 13.3* 11.4* 9.9 9.6  HGB 11.7* 11.7* 11.2* 10.4* 11.3*  HCT 35.2* 36.1* 34.9* 32.6* 34.6*  MCV 96.7 96.8 96.4 97.9 95.3  PLT 146* 159 177 172 210   Cardiac Enzymes:  Recent Labs Lab 03/01/15 0100 03/01/15 0540 03/01/15 1122  TROPONINI <0.03 <0.03 <0.03   BNP: Invalid input(s): POCBNP CBG:  Recent Labs Lab 03/05/15 1208 03/05/15 1638 03/05/15 2110 03/06/15 0730 03/06/15 1114  GLUCAP 231* 230* 215* 164* 260*    No results found for this or any previous  visit (from the past 240 hour(s)).   Studies:              All Imaging reviewed and is as per above notation   Scheduled Meds: . amiodarone  400 mg Oral BID  . aspirin EC  325 mg Oral Daily  . atorvastatin  40 mg Oral Daily  . [START ON 03/07/2015] furosemide  80 mg Oral Daily  . insulin aspart  0-5 Units Subcutaneous QHS  . insulin aspart  0-9 Units Subcutaneous TID WC  . insulin detemir  10 Units Subcutaneous QHS  . LORazepam  0.5 mg Intravenous Once  . metoprolol tartrate  50 mg Oral BID  . multivitamin with minerals  1 tablet Oral Daily  . pantoprazole  40 mg Oral Daily  . vitamin B-12  1,000 mcg Oral Daily  . warfarin  1 mg Oral ONCE-1800  . Warfarin - Pharmacist Dosing Inpatient   Does not apply q1800   Continuous Infusions:     Assessment/Plan:  1. Acute iatrogenic exacerbation of diastolic heart  failure-initially IV saline in order to resolve AKI.  Echo 55-60%.  See below section 2. Normal stress 2008, pleuritic chest pain with unlikely cardiogenic / normal Lexiscan 03/2014-cardiology plans to cardiac cath 10/17 am 3. New onset Afib + RVR-CHad2Vasc2 ~ 3-4-inconsistently maintaining NSR -initially on IV Cardizem-metoprolol 25 every 4 changed to 50 bid by Cardiology 10/16.  Magnesium was in normal range. TSH nl.  Heparin gtt discontinued --> Coumadin per pharmacy in case wishing to Cardiovert should Afib recur 4. Acute hyperkalemia on 10/12-resolved c IVF 5. Acute kidney injury  superimposed on baseline stage IIICKD- Pa peak Creat 3.11-->2.31-->.1.8--> 2.4 etio= ACE inhibitor,hypotension from hydralazine 10, + CT contrast Hyperkalemia.  held saline started 10/13 @ 75cc/hr as fluid overload. Restart discontinued 10/13- lasix changed 40 po-->> iv 80 bid 10/16 and ultimately switched to by mouth 80 daily by cardiology 10/ 6. Hypertension in a setting of relative hypotension-discontinued hydralazine 10,  Discontinued lisinopril 40 daily-see above 7. Type 2 diabetes mellitus with hypoglycemia in the 20's 10/13-continue sliding scale coverage.  CBG's 160-260.  Levemir 40 was d/c-reimplement 10 units qhs 8. Hyperlipidemia-continue atorvastatin moderate intensity/high intensity dosing 40 mg 9. GERD-continue pantoprazole 40 daily, may need to adjust for renal clearance   fll code Cardiac cath a.m. Therapy rec hh PT No family +m to update--Called wife and LM 10/17  Verneita Griffes, MD  [facility] Pager [phone] 03/06/2015, 3:02 PM    LOS: 6 days

## 2015-03-06 NOTE — Progress Notes (Signed)
ANTICOAGULATION CONSULT NOTE - Follow Up Consult  Pharmacy Consult for Heparin >> Coumadin Indication: atrial fibrillation  Allergies  Allergen Reactions  . Nifedipine Other (See Comments)    Reaction: made gums swell up. Pt states that he had to have gum surgery after taking.     Patient Measurements: Height: 5\' 3"  (160 cm) (per patient report ) Weight: 184 lb 4.8 oz (83.598 kg) IBW/kg (Calculated) : 56.9 Heparin Dosing Weight: 75 kg  Vital Signs: Temp: 98.5 F (36.9 C) (10/17 0541) BP: 178/78 mmHg (10/17 0541) Pulse Rate: 63 (10/17 0541)  Labs:  Recent Labs  03/04/15 0337 03/05/15 0338 03/05/15 0820 03/06/15 0344  HGB 10.4* 11.3*  --   --   HCT 32.6* 34.6*  --   --   PLT 172 210  --   --   LABPROT 14.8 18.0*  --  24.5*  INR 1.14 1.48  --  2.23*  HEPARINUNFRC 0.34 0.34  --  0.43  CREATININE 2.31*  --  1.84* 2.14*    Estimated Creatinine Clearance: 25.9 mL/min (by C-G formula based on Cr of 2.14).   Medications:  Scheduled:  . aspirin EC  325 mg Oral Daily  . atorvastatin  40 mg Oral Daily  . furosemide  80 mg Oral BID  . insulin aspart  0-5 Units Subcutaneous QHS  . insulin aspart  0-9 Units Subcutaneous TID WC  . insulin detemir  10 Units Subcutaneous QHS  . LORazepam  0.5 mg Intravenous Once  . metoprolol tartrate  50 mg Oral BID  . multivitamin with minerals  1 tablet Oral Daily  . pantoprazole  40 mg Oral Daily  . vitamin B-12  1,000 mcg Oral Daily  . Warfarin - Pharmacist Dosing Inpatient   Does not apply q1800   Infusions:  . heparin 1,250 Units/hr (03/05/15 1600)    Assessment: 81 yo M with new PAF on heparin and now starting Coumadin.  Coumadin pts = 5, CHADSVASC score of 4.  Heparin remains therapeutic on current rate.  No bleeding noted. INR with large jump overnight (1.48>> 2.23).  Will d/c heparin and reduce Coumadin dose.  Goal of Therapy:  INR 2-3 Heparin level 0.3-0.7 units/ml Monitor platelets by anticoagulation protocol: Yes    Plan:  Discontinue heparin and associated labs. Coumadin 1 mg PO x 1 tonight Daily INR Monitor for s/sx of bleeding  [employer], Pharm.D., BCPS Clinical Pharmacist Pager [phone] 03/06/2015 10:16 AM

## 2015-03-06 NOTE — Progress Notes (Signed)
PT Cancellation Note  Patient Details Name: Justin Leach MRN: [id number] DOB: [birth year]   Cancelled Treatment:    Reason Eval/Treat Not Completed: Patient declined, stating he just felt awful and couldn't even walk to the door.  Educated on importance of working with PT to prevent strength loss, but pt continued to politely decline.  Will check back tomorrow.   Antania Hoefling LUBECK 03/06/2015, 12:17 PM

## 2015-03-06 NOTE — Care Management Note (Signed)
Case Management Note  Patient Details  Name: Justin Leach MRN: [id number] Date of Birth: Oct 11, 1931  Subjective/Objective:   Pt admitted for CP and new onset A Fib. Initiated on IV Cardizem gtt- transitioned to amiodarone po.                  Action/Plan: CM will continue to monitor for disposition needs.    Expected Discharge Date:                  Expected Discharge Plan:  [place]  In-House Referral:  NA  Discharge planning Services  CM Consult  Post Acute Care Choice:    Choice offered to:     DME Arranged:    DME Agency:     HH Arranged:    HH Agency:     Status of Service:  In process, will continue to follow  Medicare Important Message Given:    Date Medicare IM Given:    Medicare IM give by:    Date Additional Medicare IM Given:    Additional Medicare Important Message give by:     If discussed at [place] of Stay Meetings, dates discussed:    Additional Comments:  Bethena Roys, RN 03/06/2015, 1:58 PM

## 2015-03-06 NOTE — Progress Notes (Signed)
Patient Name: Justin Leach Date of Encounter: 03/06/2015   SUBJECTIVE  Feeling well. No chest pain, sob or palpitations.   CURRENT MEDS . aspirin EC  325 mg Oral Daily  . atorvastatin  40 mg Oral Daily  . furosemide  80 mg Oral BID  . insulin aspart  0-5 Units Subcutaneous QHS  . insulin aspart  0-9 Units Subcutaneous TID WC  . insulin detemir  10 Units Subcutaneous QHS  . LORazepam  0.5 mg Intravenous Once  . metoprolol tartrate  50 mg Oral BID  . multivitamin with minerals  1 tablet Oral Daily  . pantoprazole  40 mg Oral Daily  . vitamin B-12  1,000 mcg Oral Daily  . Warfarin - Pharmacist Dosing Inpatient   Does not apply q1800    OBJECTIVE  Filed Vitals:   03/05/15 1130 03/05/15 1403 03/05/15 2135 03/06/15 0541  BP:  152/54 186/55 178/78  Pulse:   75 63  Temp:  98.2 F (36.8 C) 97.8 F (36.6 C) 98.5 F (36.9 C)  TempSrc:  Oral Oral   Resp:  20 18 24   Height:      Weight:    184 lb 4.8 oz (83.598 kg)  SpO2: 97% 94% 95% 98%    Intake/Output Summary (Last 24 hours) at 03/06/15 0752 Last data filed at 03/06/15 0600  Gross per 24 hour  Intake    600 ml  Output   5550 ml  Net  -4950 ml   Filed Weights   03/04/15 0442 03/05/15 0436 03/06/15 0541  Weight: 207 lb (93.895 kg) 191 lb 12.8 oz (87 kg) 184 lb 4.8 oz (83.598 kg)    PHYSICAL EXAM  General: Pleasant, NAD. Neuro: Alert and oriented X 3. Moves all extremities spontaneously. Psych: Normal affect. HEENT:  Normal  Neck: Supple without bruits or JVD. Lungs:  Resp regular and unlabored. Very faint bibasilar rales. No wheezing.  Heart: RRR no s3, s4, or murmurs. Abdomen: Soft, non-tender, non-distended, BS + x 4.  Extremities: No clubbing, cyanosis or edema. DP/PT/Radials 2+ and equal bilaterally.  Accessory Clinical Findings  CBC  Recent Labs  03/04/15 0337 03/05/15 0338  WBC 9.9 9.6  HGB 10.4* 11.3*  HCT 32.6* 34.6*  MCV 97.9 95.3  PLT 172 240   Basic Metabolic Panel  Recent Labs  03/05/15 0820 03/06/15 0344  NA 139 139  K 4.8 4.3  CL 104 96*  CO2 28 30  GLUCOSE 165* 167*  BUN 50* 53*  CREATININE 1.84* 2.14*  CALCIUM 9.6 9.9     Recent Labs  03/04/15 1100  TSH 2.159    TELE  NSR  Radiology/Studies  Dg Chest 2 View  03/04/2015  CLINICAL DATA:  CHF EXAM: CHEST  2 VIEW COMPARISON:  CTA chest dated 02/28/2015 FINDINGS: Cardiomegaly with mild interstitial edema. Small left pleural effusion. Associated patchy left lower lobe opacity, likely atelectasis. Mild degenerative changes of the lower thoracolumbar spine. Cervical spine fixation hardware. Cholecystectomy clips. IMPRESSION: Cardiomegaly with mild interstitial edema. Small left pleural effusion. Associated patchy left lower lobe opacity, likely atelectasis. Electronically Signed   By: Julian Hy M.D.   On: 03/04/2015 14:07   Dg Chest 2 View  02/28/2015  CLINICAL DATA:  Chest pain. EXAM: CHEST  2 VIEW COMPARISON:  November 22, 2014.  March 01, 2014. FINDINGS: The heart size and mediastinal contours are within normal limits. No pneumothorax or pleural effusion is noted. Right lung is clear. Increased left basilar opacity is noted concerning for  subsegmental atelectasis. Stable compression deformity of L1 vertebral body is noted consistent with old fracture. IMPRESSION: Mild left basilar subsegmental atelectasis. No other significant abnormality seen in the chest. Electronically Signed   By: Marijo Conception, M.D.   On: 02/28/2015 14:48   Ct Angio Chest Pe W/cm &/or Wo Cm  02/28/2015  CLINICAL DATA:  Mid chest pain and shortness of breath. EXAM: CT ANGIOGRAPHY CHEST WITH CONTRAST TECHNIQUE: Multidetector CT imaging of the chest was performed using the standard protocol during bolus administration of intravenous contrast. Multiplanar CT image reconstructions and MIPs were obtained to evaluate the vascular anatomy. CONTRAST:  62mL OMNIPAQUE IOHEXOL 350 MG/ML SOLN COMPARISON:  Chest x-ray earlier today P  FINDINGS: The pulmonary arteries are adequately opacified. There is no evidence of pulmonary embolism. The thoracic aorta is normal in caliber and shows no evidence of aneurysm or dissection. Mild atherosclerosis is noted of the aortic arch. There is a small amount of pericardial fluid. The heart is mildly enlarged. Calcified plaque is noted in the distribution of the LAD. There also is calcified plaque at the origin of the right coronary artery. Lungs show evidence of significant scarring at both bases with scattered areas of subsegmental atelectasis. There may be a mild component of pulmonary venous hypertension without overt edema. No pleural fluid is identified. No focal airspace consolidation, pneumothorax or pulmonary nodule. No airway obstruction. No enlarged lymph nodes are seen. Visualized upper abdomen is unremarkable. Bony structures show mild spondylosis of the thoracic spine. Review of the MIP images confirms the above findings. IMPRESSION: 1. No evidence of pulmonary embolism. 2. Small pericardial effusion 3. Coronary atherosclerosis with calcified plaque in the distribution of the LAD and at the origin of the RCA. 4. Prominent scarring and atelectasis at both lung bases. 5. Suggestion of pulmonary venous hypertension without overt edema. The heart is mildly enlarged. Electronically Signed   By: Aletta Edouard M.D.   On: 02/28/2015 17:31    ASSESSMENT AND PLAN Justin Leach is admitted 02/28/15 with pleuritic chest pain. Top x 3 negative. EKG sinus rhythm with 1st AV block, similar to previous EKG. D-dimer 0.86. CTA showed No evidence of pulmonary embolism. Small pericardial effusion Coronary atherosclerosis with calcified plaque in the distribution of the LAD and at the origin of the RCA.plan for outpatient Myoview.    1. Chest pain - Pleuritic; r/o MI - Currently chest pain free. - Patient has atypical and pleuritic component.   - differential includes pericarditis, costochondritis vs  MSK -  consider outpatient Myoview. .  - Continue ASA (consider switch to 81mg ), statin, lopressor -- can consolidate to 50 mg bid. - Lisinopril held due to worsening Scr  2. HTN - Had been hypotensive previously with hydralazine which he takes at home - BP up this morning  3. HL - Continue statin  4. Acute diastolic HF - Echo 67/89/3810 EF 55-60%, no RWMA, mild TR -Yesterday given IV lasix 80mg  x 2 and diuresed 4.9 L, weight down 7lb. His creatinine worsen further to 2.14. He is schedule for lasix po 80mg  BID today. Watch closely for worsening renal function.  - Very faint rales on exam.   5.  Acute on CKD, stage III  - suspected his acute on chronic renal insufficiency is related to combination of hypotension, ACE inhibitor dosing in conjunction with contrast induced nephropathy where there is probably contrast induced nephropathy and ATN at presentation--> creatinine worsen to 3.11--> improved to 1.84-->now worsen further to 2.14 with IV diuresis.  -  He is on lasix 40mg  daily at home. For Lasix 80mg  BID today./   6. New onset afib with RVR - now converted back to NSR (occasional Loletha Grayer to 75's o/n -while sleeping) - CHADSVASC score of 4 (age, HTN, DM).  - Has been converting in and out of afib, currently maintaining NSR.  - INR of 2.23. goal 2.0-3.0. Coumadin per pharmacy.  7.Wheezing: denies ever smoked in the past, no prior diagnosis of COPD and asthma. - Breathing improved  Signed, Bhagat,Bhavinkumar PA-C Pager [phone]  Patient seen and examined. Agree with assessment and plan. Telemetry now again shows recurrent AF at 116 - 125 bpm, although was in sinus earlier. . Getting coumadinized; on heparin.  Will start amiodarone 400 mg bid today with recurrent PAF. BP elevated over the weekend. Change lasix to 80 mg daily. With coronary calcification will schedule for nuclear stress test as inpatient in event high risk prior to being fully orally anticoagulated.  High liklihood for  OSA; consider outpatient sleep study.   Troy Sine, MD, [facility] 03/06/2015 10:09 AM

## 2015-03-07 ENCOUNTER — Inpatient Hospital Stay (HOSPITAL_COMMUNITY): Payer: BLUE CROSS/BLUE SHIELD

## 2015-03-07 DIAGNOSIS — R079 Chest pain, unspecified: Secondary | ICD-10-CM

## 2015-03-07 LAB — NM MYOCAR MULTI W/SPECT W/WALL MOTION / EF
CHL CUP RESTING HR STRESS: 62 {beats}/min
CSEPED: 0 min
CSEPEDS: 0 s
CSEPEW: 1 METS
CSEPPHR: 78 {beats}/min
MPHR: 139 {beats}/min
Percent HR: 56 %

## 2015-03-07 LAB — BASIC METABOLIC PANEL
ANION GAP: 9 (ref 5–15)
BUN: 51 mg/dL — ABNORMAL HIGH (ref 6–20)
CALCIUM: 9.2 mg/dL (ref 8.9–10.3)
CO2: 33 mmol/L — AB (ref 22–32)
CREATININE: 1.94 mg/dL — AB (ref 0.61–1.24)
Chloride: 96 mmol/L — ABNORMAL LOW (ref 101–111)
GFR, EST AFRICAN AMERICAN: 36 mL/min — AB (ref >60)
GFR, EST NON AFRICAN AMERICAN: 31 mL/min — AB (ref >60)
Glucose, Bld: 175 mg/dL — ABNORMAL HIGH (ref 65–99)
Potassium: 3.5 mmol/L (ref 3.5–5.1)
SODIUM: 138 mmol/L (ref 135–145)

## 2015-03-07 LAB — CBC
HEMATOCRIT: 36.2 % — AB (ref 39.0–52.0)
HEMOGLOBIN: 12.1 g/dL — AB (ref 13.0–17.0)
MCH: 31.1 pg (ref 26.0–34.0)
MCHC: 33.4 g/dL (ref 30.0–36.0)
MCV: 93.1 fL (ref 78.0–100.0)
Platelets: 268 10*3/uL (ref 150–400)
RBC: 3.89 MIL/uL — AB (ref 4.22–5.81)
RDW: 14.1 % (ref 11.5–15.5)
WBC: 11.4 10*3/uL — AB (ref 4.0–10.5)

## 2015-03-07 LAB — GLUCOSE, CAPILLARY
GLUCOSE-CAPILLARY: 161 mg/dL — AB (ref 65–99)
GLUCOSE-CAPILLARY: 300 mg/dL — AB (ref 65–99)
Glucose-Capillary: 153 mg/dL — ABNORMAL HIGH (ref 65–99)

## 2015-03-07 LAB — PROTIME-INR
INR: 2.26 — ABNORMAL HIGH (ref 0.00–1.49)
Prothrombin Time: 24.7 seconds — ABNORMAL HIGH (ref 11.6–15.2)

## 2015-03-07 MED ORDER — WARFARIN SODIUM 2.5 MG PO TABS: 2.5000 mg | ORAL_TABLET | Freq: Once | ORAL | Status: DC

## 2015-03-07 MED ORDER — INSULIN DETEMIR 100 UNIT/ML ~~LOC~~ SOLN
10.0000 [IU] | Freq: Every day | SUBCUTANEOUS | Status: DC
Start: 2015-03-07 — End: 2015-11-04

## 2015-03-07 MED ORDER — WARFARIN SODIUM 2.5 MG PO TABS
2.5000 mg | ORAL_TABLET | Freq: Once | ORAL | Status: AC
  Administered 2015-03-07: 2.5 mg via ORAL
  Filled 2015-03-07: qty 1

## 2015-03-07 MED ORDER — TECHNETIUM TC 99M SESTAMIBI GENERIC - CARDIOLITE
10.0000 | Freq: Once | INTRAVENOUS | Status: AC | PRN
  Administered 2015-03-07: 10 via INTRAVENOUS

## 2015-03-07 MED ORDER — TECHNETIUM TC 99M SESTAMIBI GENERIC - CARDIOLITE
30.0000 | Freq: Once | INTRAVENOUS | Status: AC | PRN
  Administered 2015-03-07: 30 via INTRAVENOUS

## 2015-03-07 MED ORDER — REGADENOSON 0.4 MG/5ML IV SOLN
0.4000 mg | Freq: Once | INTRAVENOUS | Status: AC
  Administered 2015-03-07: 0.4 mg via INTRAVENOUS
  Filled 2015-03-07: qty 5

## 2015-03-07 MED ORDER — REGADENOSON 0.4 MG/5ML IV SOLN
INTRAVENOUS | Status: AC
  Filled 2015-03-07: qty 5

## 2015-03-07 MED ORDER — FUROSEMIDE 80 MG PO TABS: 80.0000 mg | ORAL_TABLET | Freq: Every day | ORAL | Status: DC

## 2015-03-07 MED ORDER — AMIODARONE HCL 400 MG PO TABS: 400.0000 mg | ORAL_TABLET | Freq: Two times a day (BID) | ORAL | Status: DC

## 2015-03-07 MED ORDER — AMIODARONE HCL 200 MG PO TABS: 400.0000 mg | ORAL_TABLET | Freq: Every day | ORAL | Status: DC

## 2015-03-07 NOTE — Progress Notes (Signed)
ANTICOAGULATION CONSULT NOTE - Follow Up Consult  Pharmacy Consult for Coumadin Indication: atrial fibrillation  Allergies  Allergen Reactions  . Nifedipine Other (See Comments)    Reaction: made gums swell up. Pt states that he had to have gum surgery after taking.     Patient Measurements: Height: 5\' 3"  (160 cm) (per patient report ) Weight: 183 lb 12.8 oz (83.371 kg) IBW/kg (Calculated) : 56.9 Heparin Dosing Weight: 75 kg  Vital Signs: Temp: 98.2 F (36.8 C) (10/18 1224) Temp Source: Oral (10/18 1224) BP: 147/50 mmHg (10/18 1224) Pulse Rate: 62 (10/18 1224)  Labs:  Recent Labs  03/05/15 0338 03/05/15 0820 03/06/15 0344 03/07/15 0311  HGB 11.3*  --   --  12.1*  HCT 34.6*  --   --  36.2*  PLT 210  --   --  268  LABPROT 18.0*  --  24.5* 24.7*  INR 1.48  --  2.23* 2.26*  HEPARINUNFRC 0.34  --  0.43  --   CREATININE  --  1.84* 2.14* 1.94*    Estimated Creatinine Clearance: 28.5 mL/min (by C-G formula based on Cr of 1.94).   Medications:  Scheduled:  . amiodarone  400 mg Oral BID  . aspirin EC  325 mg Oral Daily  . atorvastatin  40 mg Oral Daily  . furosemide  80 mg Oral Daily  . insulin aspart  0-5 Units Subcutaneous QHS  . insulin aspart  0-9 Units Subcutaneous TID WC  . insulin detemir  10 Units Subcutaneous QHS  . LORazepam  0.5 mg Intravenous Once  . metoprolol tartrate  50 mg Oral BID  . multivitamin with minerals  1 tablet Oral Daily  . pantoprazole  40 mg Oral Daily  . regadenoson      . vitamin B-12  1,000 mcg Oral Daily  . Warfarin - Pharmacist Dosing Inpatient   Does not apply q1800   Infusions:     Assessment: 81 yo M with new PAF on Coumadin.  Coumadin pts = 5, CHADSVASC score of 4.   INR is therapeutic.  Noted new addition of Amiodarone which will have eventual impact on Coumadin dosing.  Goal of Therapy:  INR 2-3 Heparin level 0.3-0.7 units/ml Monitor platelets by anticoagulation protocol: Yes   Plan:  Coumadin 2.5 mg PO x 1  tonight Daily INR Monitor for s/sx of bleeding  [employer], Pharm.D., BCPS Clinical Pharmacist Pager [phone] 03/07/2015 1:23 PM

## 2015-03-07 NOTE — Progress Notes (Signed)
Patient Name: Justin Leach Date of Encounter: 03/07/2015  Principal Problem:   Chest pain Active Problems:   Hypertension   Renal failure (ARF), acute on chronic (HCC)   Hyperlipidemia   Diabetes mellitus type 2, controlled ([place])   Pleuritic chest pain   Atrial fibrillation, new onset ([place])   Acute diastolic heart failure [facility])   Primary Cardiologist: Dr. Percival Spanish  SUBJECTIVE: Denies any chest pain, shortness of breath, or palpitations. Seen in [place] for 1-day [facility].  OBJECTIVE Filed Vitals:   03/07/15 0940 03/07/15 0942 03/07/15 0944 03/07/15 0945  BP: 163/55 147/52 156/52   Pulse: 75 74 78 76  Temp:      TempSrc:      Resp:      Height:      Weight:      SpO2:        Intake/Output Summary (Last 24 hours) at 03/07/15 0957 Last data filed at 03/07/15 0805  Gross per 24 hour  Intake    480 ml  Output   2600 ml  Net  -2120 ml   Filed Weights   03/05/15 0436 03/06/15 0541 03/07/15 0500  Weight: 191 lb 12.8 oz (87 kg) 184 lb 4.8 oz (83.598 kg) 183 lb 12.8 oz (83.371 kg)    PHYSICAL EXAM General: Well developed, well nourished, male in no acute distress. Head: Normocephalic, atraumatic.  Neck: Supple without bruits, JVD not elevated. Lungs:  Resp regular and unlabored, CTA without wheezing or rales. Heart: RRR, S1, S2, no S3, S4, or murmur; no rub. Abdomen: Soft, non-tender, non-distended with normoactive bowel sounds. No hepatomegaly. No rebound/guarding. No obvious abdominal masses. Extremities: No clubbing, cyanosis, or edema. Distal pedal pulses are 2+ bilaterally. Neuro: Alert and oriented X 3. Moves all extremities spontaneously. Psych: Normal affect.   LABS: CBC: Recent Labs  03/05/15 0338 03/07/15 0311  WBC 9.6 11.4*  HGB 11.3* 12.1*  HCT 34.6* 36.2*  MCV 95.3 93.1  PLT 210 268   INR: Recent Labs  03/07/15 0311  INR 1.61*   Basic Metabolic Panel: Recent Labs  03/06/15 0344 03/07/15 0311  NA 139 138  K 4.3 3.5  CL 96*  96*  CO2 30 33*  GLUCOSE 167* 175*  BUN 53* 51*  CREATININE 2.14* 1.94*  CALCIUM 9.9 9.2   BNP:  B NATRIURETIC PEPTIDE  Date/Time Value Ref Range Status  03/04/2015 11:00 AM 904.6* 0.0 - 100.0 pg/mL Final   Thyroid Function Tests: Recent Labs  03/04/15 1100  TSH 2.159   TELE:    NSR with episodes of atrial fibrillation. Rate controlled in 70's -80's.     ECHO: 03/02/2015 Study Conclusions - Left ventricle: The cavity size was normal. There was severe focal basal hypertrophy of the septum. Systolic function was normal. The estimated ejection fraction was in the range of 55% to 60%. Wall motion was normal; there were no regional wall motion abnormalities. - Mitral valve: Transvalvular velocity was within the normal range. There was no evidence for stenosis. There was trivial regurgitation. Valve area by continuity equation (using LVOT flow): 2.3 cm^2. - Left atrium: The atrium was normal in size. - Right ventricle: The cavity size was normal. Wall thickness was normal. Systolic function was normal. - Tricuspid valve: There was mild regurgitation.  Current Medications:  . amiodarone  400 mg Oral BID  . aspirin EC  325 mg Oral Daily  . atorvastatin  40 mg Oral Daily  . furosemide  80 mg Oral Daily  .  insulin aspart  0-5 Units Subcutaneous QHS  . insulin aspart  0-9 Units Subcutaneous TID WC  . insulin detemir  10 Units Subcutaneous QHS  . LORazepam  0.5 mg Intravenous Once  . metoprolol tartrate  50 mg Oral BID  . multivitamin with minerals  1 tablet Oral Daily  . pantoprazole  40 mg Oral Daily  . regadenoson      . vitamin B-12  1,000 mcg Oral Daily  . Warfarin - Pharmacist Dosing Inpatient   Does not apply q1800      ASSESSMENT AND PLAN:  1. Chest pain - Patient has atypical and pleuritic component. Differential includes pericarditis, costochondritis vs MSK - Continue ASA (consider switch to 81mg ), statin, BB - Lisinopril held due to  worsening Scr  2. HTN - BP has been 115/52 - 172/78 in the past 24 hours. - Had been hypotensive previously with hydralazine which he takes at home  3. HLD - Continue statin therapy  4. Acute diastolic HF - Echo 75/30/0511 EF 55-60%, no RWMA, mild TR - Switched from IV to PO Lasix on 03/06/2015. Currently on Lasix 80mg  daily.  5. Acute on CKD, stage III  - suspected his acute on chronic renal insufficiency is related to combination of hypotension, ACE inhibitor dosing in conjunction with contrast induced nephropathy where there is probably contrast induced nephropathy and ATN at presentation. - Creatinine improved to 1.94 on 03/07/2015, from 2.14 on previous day.  6. New onset afib with RVR - now converted back to NSR (occasional Brady to 43's o/n -while sleeping) - CHADSVASC score of 4 (age, HTN, DM).  - Has been converting in and out of afib, currently maintaining NSR.  - INR of 2.23. goal 2.0-3.0. Coumadin per pharmacy. - started on Amiodarone 400mg  BID on 03/06/2015.   Arna Medici , PA-C 9:57 AM 03/07/2015 Pager: [phone]  Patient seen and examined. Agree with assessment and plan.  Patient underwent nuclear perfusion study earlier this morning.  Results still pending.  Early this morning, he was back in sinus rhythm but presently he has reverted back into atrial fibrillation with a ventricular rate of 110 -115 bpm.  He complains of feeling weak.  He denies chest pain.  He was started on amiodarone 400 mg twice a day yesterday and  with its long half-life is subtherapeutic and not fully loaded.  He is on anticoagulation with Coumadin per pharmacy.  If his nuclear study is suggestive of significant CAD/ischemia, definitive catheterization may be necessary prior to obtaining therapeutic anticoagulation.   Troy Sine, MD, [facility] 03/07/2015 11:28 AM

## 2015-03-07 NOTE — Discharge Summary (Signed)
Physician Discharge Summary  Justin Leach XNT:[id number] DOB: Mar 29, 1932 DOA: 02/28/2015  PCP: Irven Shelling, MD  Admit date: 02/28/2015 Discharge date: 03/07/2015  Time spent: 45 minutes  Recommendations for Outpatient Follow-up:  1. Needs amiodarone initiation and downward titration 400 mg bid till 11/1 then 400 od till seen by cardiology-Get TSH and Chme 12 ~ 1 mo 2. Changed lasix dose to 80 po daily-check mag, cmet 1 week.  rec close f/u pcp/cardiology 3. Need INR check ~ 4 day 4. Daily weights needed 5. HH Pt/OT Rn recommended-patient declines the same 6. pcp please not chang ein insulin dosing to 10 U lanrus monotherapy  Discharge Diagnoses:  Principal Problem:   Chest pain Active Problems:   Hypertension   Renal failure (ARF), acute on chronic (HCC)   Hyperlipidemia   Diabetes mellitus type 2, controlled ([place])   Pleuritic chest pain   Atrial fibrillation, new onset ([place])   Acute diastolic heart failure ([place])   Discharge Condition: fair  Diet recommendation:  HH low salt  Filed Weights   03/05/15 0436 03/06/15 0541 03/07/15 0500  Weight: 87 kg (191 lb 12.8 oz) 83.598 kg (184 lb 4.8 oz) 83.371 kg (183 lb 12.8 oz)    History of present illness:  81 y/o ? Nuclear stress 2008 normal, last Myoview 03/2014 normal with EF of 63% HTN Diabetes mellitus TY II HLD CKD stage III-IV baseline  Admitted to the hospital 02/28/15 retrosternal chest pain persistent and EKG will show nonspecific findings-Cardiac enzymes were negative had CT scan of chest 10/11 which did not show anypulmonary embolism  Developed AKI with rising creatining  Developed new onset paroxysmal Afib with RVR 10/13 and plan was initially to cardiovert  transient iatrogenic hypoglycemia on 10/14  Hospital Course:   1. Acute iatrogenic exacerbation of diastolic heart failure-initially IV saline in order to resolve AKI. Echo 55-60%. See below section 2. Normal stress 2008, pleuritic chest  pain with unlikely cardiogenic / normal Lexiscan 03/2014-Lexiscan repeat 10/18 was neg for ischemia and patient felt stabilized to d/c home 3. New onset Afib + RVR-CHad2Vasc2 ~ 3-4-inconsistently maintaining NSR -initially on IV Cardizem-metoprolol 25 every 4 changed to 50 bid by Cardiology 10/16. Magnesium was in normal range. TSH nl. Heparin gtt discontinued --> Coumadin.  Recheck INR 4-5 days 4. Acute hyperkalemia on 10/12-resolved c IVF 5. Acute kidney injurysuperimposed on baseline stage IIICKD- Pa peak Creat 3.11-->2.31-->.1.8--> 2.4 etio= ACE inhibitor, hypotension from hydralazine 10, + CT contrast Hyperkalemia. held saline started 10/13 @ 75cc/hr as fluid overload. Restart discontinued 10/13- lasix changed 40 po-->> iv 80 bid 10/16 and ultimately switched to by mouth 80 daily by cardiology 10/18.  We will continue this dose.  Daily weight as OP.   6. Hypertension in a setting of relative hypotension-discontinued hydralazine 10, Discontinued lisinopril 40 daily-see above 7. Type 2 diabetes mellitus with hypoglycemia in the 20's 10/13-continue sliding scale coverage. CBG's 160-300 Levemir 40 was d/c-reimplement 10 units qhs--uptitrate as OP 8. Hyperlipidemia-continue atorvastatin moderate intensity/high intensity dosing 40 mg 9. GERD-continue pantoprazole 40 daily   Procedures: lexiscan Consultations:  Cardiology  Discharge Exam: Filed Vitals:   03/07/15 1224  BP: 147/50  Pulse: 62  Temp: 98.2 F (36.8 C)  Resp: 35   Well no issues tol diet No cp No sob No n/v Feels less SOB  General: eomi ncat Cardiovascular: s1 s2 no m/r/g Respiratory: clear no added sound  Discharge Instructions   Discharge Instructions    Diet - low sodium heart healthy  Complete by:  As directed      Discharge instructions    Complete by:  As directed   please look at the list of her medications carefully prior to discharge as some medications of changed You will need to be on a blood to  call Coumadin for your atrial fibrillation We will get a scheduled with cardiology to see a specialist in atrial fibrillation soon I would recommend that you cut back on your insulin doses until you check her blood sugars at home on a regular diet as you have not needed nowhere near as much as he normally do and you're going home on 10 units instead of 40 or 60 You will need blood testing to check your kidney function in the near future and this can probably be done with your Coumadin blood checks or INR levels in about for 5 days  we will ask home health to come out and help you at home     Increase activity slowly    Complete by:  As directed           Current Discharge Medication List    START taking these medications   Details  !! amiodarone (PACERONE) 200 MG tablet Take 2 tablets (400 mg total) by mouth daily. Qty: 60 tablet, Refills: 0    !! amiodarone (PACERONE) 400 MG tablet Take 1 tablet (400 mg total) by mouth 2 (two) times daily. Qty: 28 tablet, Refills: 0    warfarin (COUMADIN) 2.5 MG tablet Take 1 tablet (2.5 mg total) by mouth one time only at 6 PM. Qty: 7 tablet, Refills: 0     !! - Potential duplicate medications found. Please discuss with provider.    CONTINUE these medications which have CHANGED   Details  furosemide (LASIX) 80 MG tablet Take 1 tablet (80 mg total) by mouth daily. Qty: 15 tablet, Refills: 0    insulin detemir (LEVEMIR) 100 UNIT/ML injection Inject 0.1 mLs (10 Units total) into the skin at bedtime. Qty: 10 mL, Refills: 11      CONTINUE these medications which have NOT CHANGED   Details  aspirin 325 MG tablet Take 325 mg by mouth 2 (two) times daily.     atorvastatin (LIPITOR) 80 MG tablet Take 40 mg by mouth daily. Refills: 2    Cholecalciferol (VITAMIN D-3) 1000 UNITS CAPS Take 1 capsule by mouth daily.    insulin lispro (HUMALOG) 100 UNIT/ML injection Inject 10-30 Units into the skin 3 (three) times daily before meals.    metoprolol  (LOPRESSOR) 50 MG tablet Take 50 mg by mouth 2 (two) times daily.    omeprazole (PRILOSEC) 20 MG capsule Take 20 mg by mouth daily.     vitamin B-12 (CYANOCOBALAMIN) 1000 MCG tablet Take 1,000 mcg by mouth daily.      STOP taking these medications     fosinopril (MONOPRIL) 40 MG tablet      hydrALAZINE (APRESOLINE) 10 MG tablet      Multiple Vitamin (MULTIVITAMIN WITH MINERALS) TABS tablet        Allergies  Allergen Reactions  . Nifedipine Other (See Comments)    Reaction: made gums swell up. Pt states that he had to have gum surgery after taking.       The results of significant diagnostics from this hospitalization (including imaging, microbiology, ancillary and laboratory) are listed below for reference.    Significant Diagnostic Studies: Dg Chest 2 View  03/04/2015  CLINICAL DATA:  CHF EXAM: CHEST  2 VIEW COMPARISON:  CTA chest dated 02/28/2015 FINDINGS: Cardiomegaly with mild interstitial edema. Small left pleural effusion. Associated patchy left lower lobe opacity, likely atelectasis. Mild degenerative changes of the lower thoracolumbar spine. Cervical spine fixation hardware. Cholecystectomy clips. IMPRESSION: Cardiomegaly with mild interstitial edema. Small left pleural effusion. Associated patchy left lower lobe opacity, likely atelectasis. Electronically Signed   By: Julian Hy M.D.   On: 03/04/2015 14:07   Dg Chest 2 View  02/28/2015  CLINICAL DATA:  Chest pain. EXAM: CHEST  2 VIEW COMPARISON:  November 22, 2014.  March 01, 2014. FINDINGS: The heart size and mediastinal contours are within normal limits. No pneumothorax or pleural effusion is noted. Right lung is clear. Increased left basilar opacity is noted concerning for subsegmental atelectasis. Stable compression deformity of L1 vertebral body is noted consistent with old fracture. IMPRESSION: Mild left basilar subsegmental atelectasis. No other significant abnormality seen in the chest. Electronically Signed    By: Marijo Conception, M.D.   On: 02/28/2015 14:48   Ct Angio Chest Pe W/cm &/or Wo Cm  02/28/2015  CLINICAL DATA:  Mid chest pain and shortness of breath. EXAM: CT ANGIOGRAPHY CHEST WITH CONTRAST TECHNIQUE: Multidetector CT imaging of the chest was performed using the standard protocol during bolus administration of intravenous contrast. Multiplanar CT image reconstructions and MIPs were obtained to evaluate the vascular anatomy. CONTRAST:  55mL OMNIPAQUE IOHEXOL 350 MG/ML SOLN COMPARISON:  Chest x-ray earlier today P FINDINGS: The pulmonary arteries are adequately opacified. There is no evidence of pulmonary embolism. The thoracic aorta is normal in caliber and shows no evidence of aneurysm or dissection. Mild atherosclerosis is noted of the aortic arch. There is a small amount of pericardial fluid. The heart is mildly enlarged. Calcified plaque is noted in the distribution of the LAD. There also is calcified plaque at the origin of the right coronary artery. Lungs show evidence of significant scarring at both bases with scattered areas of subsegmental atelectasis. There may be a mild component of pulmonary venous hypertension without overt edema. No pleural fluid is identified. No focal airspace consolidation, pneumothorax or pulmonary nodule. No airway obstruction. No enlarged lymph nodes are seen. Visualized upper abdomen is unremarkable. Bony structures show mild spondylosis of the thoracic spine. Review of the MIP images confirms the above findings. IMPRESSION: 1. No evidence of pulmonary embolism. 2. Small pericardial effusion 3. Coronary atherosclerosis with calcified plaque in the distribution of the LAD and at the origin of the RCA. 4. Prominent scarring and atelectasis at both lung bases. 5. Suggestion of pulmonary venous hypertension without overt edema. The heart is mildly enlarged. Electronically Signed   By: Aletta Edouard M.D.   On: 02/28/2015 17:31   Nm Myocar Multi W/spect W/wall Motion /  Ef  03/07/2015  CLINICAL DATA:  New onset of atrial fibrillation and chest pain. EXAM: MYOCARDIAL IMAGING WITH SPECT (REST AND PHARMACOLOGIC-STRESS) GATED LEFT VENTRICULAR WALL MOTION STUDY LEFT VENTRICULAR EJECTION FRACTION TECHNIQUE: Standard myocardial SPECT imaging was performed after resting intravenous injection of 10 mCi Tc-73m sestamibi. Subsequently, intravenous infusion of Lexiscan was performed under the supervision of the Cardiology staff. At peak effect of the drug, 30 mCi Tc-11m sestamibi was injected intravenously and standard myocardial SPECT imaging was performed. Quantitative gated imaging was also performed to evaluate left ventricular wall motion, and estimate left ventricular ejection fraction. COMPARISON:  None. FINDINGS: Perfusion: No decreased activity in the left ventricle on stress imaging to suggest reversible ischemia or infarction. Wall Motion: Normal left  ventricular wall motion. No left ventricular dilation. Left Ventricular Ejection Fraction: 71 % End diastolic volume 68 ml End systolic volume 19 ml IMPRESSION: 1. No reversible ischemia or infarction. 2. Normal left ventricular wall motion. 3. Left ventricular ejection fraction 71% 4. Low-risk stress test findings*. *2012 Appropriate Use Criteria for Coronary Revascularization Focused Update: J Am Coll Cardiol. 8119;14(7):829-562. http://content.airportbarriers.com.aspx?articleid=[email or web address] Electronically Signed   By: Marijo Sanes M.D.   On: 03/07/2015 17:04    Microbiology: No results found for this or any previous visit (from the past 240 hour(s)).   Labs: Basic Metabolic Panel:  Recent Labs Lab 03/02/15 0155 03/02/15 1008 03/03/15 0121 03/04/15 0337 03/05/15 0820 03/06/15 0344 03/07/15 0311  NA 135  --  135 139 139 139 138  K 5.0  --  4.8 4.9 4.8 4.3 3.5  CL 97*  --  101 110 104 96* 96*  CO2 26  --  25 24 28 30  33*  GLUCOSE 227*  --  81 119* 165* 167* 175*  BUN 57*  --  72* 60* 50* 53* 51*  CREATININE  3.11*  --  3.02* 2.31* 1.84* 2.14* 1.94*  CALCIUM 8.9  --  8.8* 8.7* 9.6 9.9 9.2  MG 2.1 2.2  --   --   --   --   --    Liver Function Tests:  Recent Labs Lab 03/03/15 0121  AST 28  ALT 23  ALKPHOS 86  BILITOT 0.3  PROT 5.4*  ALBUMIN 2.8*   No results for input(s): LIPASE, AMYLASE in the last 168 hours. No results for input(s): AMMONIA in the last 168 hours. CBC:  Recent Labs Lab 03/02/15 0155 03/03/15 0121 03/04/15 0337 03/05/15 0338 03/07/15 0311  WBC 13.3* 11.4* 9.9 9.6 11.4*  HGB 11.7* 11.2* 10.4* 11.3* 12.1*  HCT 36.1* 34.9* 32.6* 34.6* 36.2*  MCV 96.8 96.4 97.9 95.3 93.1  PLT 159 177 172 210 268   Cardiac Enzymes:  Recent Labs Lab 03/01/15 0100 03/01/15 0540 03/01/15 1122  TROPONINI <0.03 <0.03 <0.03   BNP: BNP (last 3 results)  Recent Labs  03/04/15 1100  BNP 904.6*    ProBNP (last 3 results) No results for input(s): PROBNP in the last 8760 hours.  CBG:  Recent Labs Lab 03/06/15 1642 03/06/15 2118 03/07/15 0725 03/07/15 1108 03/07/15 1626  GLUCAP 224* 234* 153* 161* 300*       Signed:  Nita Sells  [facility] 03/07/2015, 5:09 PM

## 2015-03-07 NOTE — Progress Notes (Signed)
Asked pt if he wanted to take a bath. Pt stated "I do not want to wash up right  Now, I'm too weak. The doctor said I could take a shower when I get discharged." Will continue to monitor.   Ruben Reason, RN

## 2015-03-07 NOTE — Progress Notes (Signed)
1-day Lexiscan complete. Awaiting official read by [facility]iology later today.  Signed, Erma Heritage, PA-C 03/07/2015, 9:56 AM Pager: [phone]

## 2015-03-07 NOTE — Progress Notes (Signed)
Physical Therapy Treatment Patient Details Name: Justin Leach MRN: [id number] DOB: [birth year] Today's Date: 03/07/2015    History of Present Illness Pt is a 81 y/o M who presented to ER 2/2 chest pain.  Went into afib RVR on 03/01/15 which was new for him.  Pt's PMH includes DM II, cervical disc disease, CKD, rotator cuff repair Bil.    PT Comments    Pt demonstrated improved balance this session w/ use of cane w/ ambulation. He ambulated 150 ft in hallway w/ min guard assist for pt safety.  Pt will benefit from continued skilled PT services to increase functional independence and safety.   Follow Up Recommendations  Home health PT;Supervision for mobility/OOB     Equipment Recommendations  Cane Charity fundraiser)    Recommendations for Other Services OT consult     Precautions / Restrictions Precautions Precautions: Fall Restrictions Weight Bearing Restrictions: No    Mobility  Bed Mobility Overal bed mobility: Modified Independent             General bed mobility comments: Pt quick to sit up.  Min use of bed rail.  Transfers Overall transfer level: Needs assistance Equipment used: Straight cane Transfers: Sit to/from Stand Sit to Stand: Min guard         General transfer comment: Close min guard for pt's safety as mild instability noted w/ sit<>stand.    Ambulation/Gait Ambulation/Gait assistance: Min guard Ambulation Distance (Feet): 150 Feet Assistive device: Straight cane Gait Pattern/deviations: Step-through pattern;Drifts right/left;Antalgic;Decreased stride length   Gait velocity interpretation: Below normal speed for age/gender General Gait Details: Mild instability noted and min guard provided for safety. Increased stability w/ use of cane compared to last session w/o.  2 standing resk breaks as pt becomes SOB; however SpO2 remains above 90% throughout entire session.   Stairs            Wheelchair Mobility    Modified Rankin (Stroke  Patients Only)       Balance Overall balance assessment: Needs assistance Sitting-balance support: Bilateral upper extremity supported;Feet supported Sitting balance-Leahy Scale: Good     Standing balance support: Single extremity supported;During functional activity Standing balance-Leahy Scale: Fair Standing balance comment: Pt able to stand at sink and wash hands w/ min guard assist                    Cognition Arousal/Alertness: Awake/alert Behavior During Therapy: WFL for tasks assessed/performed Overall Cognitive Status: Within Functional Limits for tasks assessed                      Exercises      General Comments General comments (skin integrity, edema, etc.): CM made aware of pt's update equipment need for quad cane.        Pertinent Vitals/Pain Pain Assessment: No/denies pain    Home Living                      Prior Function            PT Goals (current goals can now be found in the care plan section) Acute Rehab PT Goals Patient Stated Goal: to go home once ready to spend time w/ his wife and dog PT Goal Formulation: With patient Time For Goal Achievement: 03/17/15 Potential to Achieve Goals: Good Progress towards PT goals: Progressing toward goals    Frequency  Min 2X/week    PT Plan Current plan remains appropriate  Co-evaluation             End of Session Equipment Utilized During Treatment: Gait belt Activity Tolerance: Patient limited by fatigue;Patient tolerated treatment well Patient left: in chair;with call bell/phone within reach;with chair alarm set     Time: 4360-6770 PT Time Calculation (min) (ACUTE ONLY): 28 min  Charges:  $Gait Training: 23-37 mins                    G Codes:      Joslyn Hy PT, [place] [phone] Pager: [phone] 03/07/2015, 4:46 PM

## 2015-03-07 NOTE — Care Management Note (Signed)
Case Management Note  Patient Details  Name: Justin Leach MRN: [id number] Date of Birth: November 22, 1931  Subjective/Objective:   Pt admitted for Chest pain. Plan is for d/c home 03-07-15. Pt is from home with wife.                  Action/Plan: Per pt he is refusing [place] at this time. Pt states he walks his dog daily and will not need PT. CM did speak with pt in regards to medications and if has a local pharmacy to get medications. Per pt he states his wife uses [facility] and Rx will need to be sent there. CM will fax Rx to the [facility] to make them aware that pt is being d/c soon and medications that will be needed once MD takes a look at medications. No further needs from CM at this time.    Expected Discharge Date:                  Expected Discharge Plan:  [place]  In-House Referral:  NA  Discharge planning Services  CM Consult  Post Acute Care Choice:  NA Choice offered to:  NA  DME Arranged:  N/A DME Agency:  NA  HH Arranged:  NA HH Agency:     Status of Service:  Completed, signed off  Medicare Important Message Given:    Date Medicare IM Given:    Medicare IM give by:    Date Additional Medicare IM Given:    Additional Medicare Important Message give by:     If discussed at [place] of Stay Meetings, dates discussed:    Additional Comments:  Bethena Roys, RN 03/07/2015, 3:31 PM

## 2015-03-07 NOTE — Progress Notes (Signed)
Pt given discharge instructions. Discharge instructions were gone over with patient. Patient verbalizes understanding of discharge instructions. Patient insisted on taking a shower before leaving. Patient leaving with wife.   Ruben Reason, RN

## 2015-03-09 ENCOUNTER — Telehealth: Payer: Self-pay | Admitting: Cardiology

## 2015-03-09 NOTE — Telephone Encounter (Signed)
1 wk TCM per B Strader  10/27 @ 1030 w. Melina Copa

## 2015-03-10 NOTE — Telephone Encounter (Signed)
Left message to call back - d/c appt.

## 2015-03-13 NOTE — Telephone Encounter (Signed)
Patient contacted regarding discharge from [facility] on 03/07/2015.  Patient understands to follow up with provider D. Dunn, PA at 03/16/15 at 1030am @ [address] Patient understands discharge instructions? YES Patient understands medications and regiment? YES - his PCP checked his INR and changed his warfarin tab strength Patient understands to bring all medications to this visit? YES  He reports he is not back eating like he used to.  He states he may need Rx for the [place] - will discuss at his OV on 10/27

## 2015-03-15 ENCOUNTER — Encounter: Payer: Self-pay | Admitting: Physician Assistant

## 2015-03-15 DIAGNOSIS — N183 Chronic kidney disease, stage 3 unspecified: Secondary | ICD-10-CM | POA: Insufficient documentation

## 2015-03-15 DIAGNOSIS — I2510 Atherosclerotic heart disease of native coronary artery without angina pectoris: Secondary | ICD-10-CM | POA: Insufficient documentation

## 2015-03-15 DIAGNOSIS — I10 Essential (primary) hypertension: Secondary | ICD-10-CM | POA: Insufficient documentation

## 2015-03-15 DIAGNOSIS — I5032 Chronic diastolic (congestive) heart failure: Secondary | ICD-10-CM | POA: Insufficient documentation

## 2015-03-15 DIAGNOSIS — I480 Paroxysmal atrial fibrillation: Secondary | ICD-10-CM | POA: Insufficient documentation

## 2015-03-15 DIAGNOSIS — E1129 Type 2 diabetes mellitus with other diabetic kidney complication: Secondary | ICD-10-CM | POA: Insufficient documentation

## 2015-03-15 NOTE — Progress Notes (Signed)
Cardiology Office Note Date:  03/16/2015  Patient ID:  Justin Leach, Justin Leach 1932/02/28, MRN [id number] PCP:  Irven Shelling, MD  Cardiologist:  Dr. Percival Spanish   Chief Complaint: f/u afib, CHF  History of Present Illness: Justin Leach is a 81 y.o. male with history of HTN, DM2, HLD, CKD stage III, GERD, recently diagnosed PAF/disatolic CHF who presents for follow-up.   He was recently admitted 02/18/15 with pleuritic chest pain and dyspnea. Initial troponins were negative. D-dimer was 0.86 and CTA showed no evidence of PE, +small pericardial effusion, +coronary atherosclerosis, scarring of lung bases, pulmonary venous HTN. F/u 2D echo 03/02/15 showed severe focal basal hypertrophy of the septum, EF 55-60%, mild MR, no effusion. During his admission he went into paroxysms of atrial fib with RVR. He was treated with heparin -> Coumadin as well as diltiazem and BB titration. These were adjusted due to intermittent bradycardia with HR in the 40s. NOAC was not used due to AKI on CKD, which was felt 2/2 combination of hypotension, ACEI dosing, and contrast from CT scan (peak Cr 3.11). Unfortunately he developed acute diastolic CHF felt related to IV fluids administered to rectify this, requiring IV Lasix. Given his coronary calcification on CT scan he underwent nuclear stress test 03/07/15 which was negative. Since he was going in and out of afib, he was ultimately started on amiodarone. He was discharged on Lasix 80mg  daily. Notable labs BUN/Cr 51/1.94 at d/c, Hgb 12.1, TSH wnl, baseline LFTs ok (albumin 2.8), Mg 2.2.  He comes in for follow-up. He is feeling about back to his pre-hospital status. He has had a chronic level of dyspnea for the past year - somewhat improved since discharge. He also feels that his energy is not that good - endurance is down. A year ago he used to be able to mow and walk 2 miles a day. He is no longer complaining of pleuritic chest pain. He has not had any palpitations  (but it is not clear that he felt his AF when he was in the hospital). He is a nonsmoker and did not have any known occupational inhalational exposure. I confirmed he is no longer on aspirin. PCP is following INR closely. Weight is up in clinic today but he is wearing heavy overalls and steel-toed boots. He follows his weight at home and reports it's hovered around 184lb.   Past Medical History  Diagnosis Date  . Essential hypertension   . Type II diabetes mellitus ([place])   . Pneumonia 2007  . Cervical disc disease   . Dyslipidemia   . CKD (chronic kidney disease), stage III   . GERD (gastroesophageal reflux disease)   . PAF (paroxysmal atrial fibrillation) ([place])     a. Dx 02/2015 - in and out on tele, rx'd Coumadin and amiodarone.  . Chronic diastolic CHF (congestive heart failure) ([place])     a. Dx 02/2015 -  2D echo 03/02/15 showed severe focal basal hypertrophy of the septum, EF 55-60%, mild MR, no effusion.  . Coronary artery calcification seen on CT scan     a. Nuc 02/2015 was normal.  . Bradycardia     a. during 02/2015 admission - occasional HR in 40s while being treated for atrial fib.    Past Surgical History  Procedure Laterality Date  . Eye surgery  1930's    "don't know what for" (05/11/2013)  . Cholecystectomy    . Cervical disc surgery      "i've had 3 neck  ORs; not sure what kind; all thru the back of my neck"  . Posterior fusion cervical spine    . Knee arthroscopy      "had one scope; one open knee OR; not sure which on which side" (05/10/2013)  . Knee arthroplasty      "had one scope; one open knee OR; not sure which on which side" (05/10/2013)  . Rotator cuff repair Bilateral   . Joint replacement      bilateral knees, elbows, and shoulders (on 05/11/2013 pt denies all joint replacements"   . Cataract extraction w/ intraocular lens  implant, bilateral Bilateral     Current Outpatient Prescriptions  Medication Sig Dispense Refill  . amiodarone (PACERONE) 200  MG tablet Take 2 pills by mouth two times a day for 14 days then 2 pills by mouth daily    . atorvastatin (LIPITOR) 80 MG tablet Take 40 mg by mouth daily.  2  . Cholecalciferol (VITAMIN D-3) 1000 UNITS CAPS Take 1 capsule by mouth daily.    . furosemide (LASIX) 80 MG tablet Take 1 tablet (80 mg total) by mouth daily. 15 tablet 0  . insulin detemir (LEVEMIR) 100 UNIT/ML injection Inject 0.1 mLs (10 Units total) into the skin at bedtime. 10 mL 11  . insulin lispro (HUMALOG) 100 UNIT/ML injection Inject 10-30 Units into the skin 3 (three) times daily before meals.    . metoprolol (LOPRESSOR) 50 MG tablet Take 50 mg by mouth 2 (two) times daily.    Marland Kitchen omeprazole (PRILOSEC) 20 MG capsule Take 20 mg by mouth daily.     . ondansetron (ZOFRAN) 4 MG tablet Take 4 mg by mouth 3 (three) times daily as needed for nausea or vomiting.    . vitamin B-12 (CYANOCOBALAMIN) 1000 MCG tablet Take 1,000 mcg by mouth daily.    Marland Kitchen warfarin (COUMADIN) 2.5 MG tablet Take 1 tablet (2.5 mg total) by mouth one time only at 6 PM. 7 tablet 0   No current facility-administered medications for this visit.    Allergies:   Nifedipine   Social History:  The patient  reports that he has never smoked. He has quit using smokeless tobacco. His smokeless tobacco use included Chew. He reports that he does not drink alcohol or use illicit drugs.   Family History:  The patient's family history includes Diabetes Mellitus II in his paternal grandmother. There is no history of Heart disease, Cancer, or Diabetes.  ROS:  Please see the history of present illness.  All other systems are reviewed and otherwise negative.   PHYSICAL EXAM:  VS:  BP 126/46 mmHg  Pulse 56  Ht 5\' 8"  (1.727 m)  Wt 191 lb 9.6 oz (86.909 kg)  BMI 29.14 kg/m2  SpO2 93% BMI: Body mass index is 29.14 kg/(m^2). Well nourished, well developed WM, in no acute distress HEENT: normocephalic, atraumatic Neck: no JVD, carotid bruits or masses Cardiac:  normal S1, S2;  RRR; no murmurs, rubs, or gallops Lungs:  clear to auscultation bilaterally, no wheezing, rhonchi or rales Abd: soft, nontender, no hepatomegaly, + BS MS: no deformity or atrophy Ext: no edema Skin: warm and dry, no rash Neuro:  moves all extremities spontaneously, no focal abnormalities noted, follows commands Psych: euthymic mood, full affect   EKG:  Done today shows sinus bradycardia 56bpm, first degree AVB (PR 270ms), nonspeciifc T wave changes with flattening in I, avL, possible TWI V6  Recent Labs: 03/02/2015: Magnesium 2.2 03/03/2015: ALT 23 03/04/2015: B Natriuretic Peptide 904.6*;  TSH 2.159 03/07/2015: BUN 51*; Creatinine, Ser 1.94*; Hemoglobin 12.1*; Platelets 268; Potassium 3.5; Sodium 138  No results found for requested labs within last 365 days.   Estimated Creatinine Clearance: 32 mL/min (by C-G formula based on Cr of 1.94).   Wt Readings from Last 3 Encounters:  03/16/15 191 lb 9.6 oz (86.909 kg)  03/07/15 183 lb 12.8 oz (83.371 kg)  11/22/14 194 lb (87.998 kg)     Other studies reviewed: Additional studies/records reviewed today include: summarized above  ASSESSMENT AND PLAN:  1. PAF with intermittent bradycardia - maintaining NSR on amiodarone in clinic today. He was given amiodarone 200mg  tablets at discharge - is taking 2 tablets BID for 2 weeks then dropping to 1 tablet BID. I have instructed him at that point to continue 1 tablet BID for 2 weeks then drop to 1 tablet daily. Recent LFTs, TSH OK. Will arrange baseline PFTs given chronic dyspnea and amiodarone use.  Further monitoring of organ systems while on amiodarone will be at discretion of primary cardiologist. Suspect his chronic dyspnea and decreased endurance are due to deconditioning. We discussed importance of remaining active. Will follow these issues clinically. F/u labs today. 2. Chronic diastolic CHF with severe LVH/hypertensive heart disease - appears euvolemic on exam. Weight is up in clinic today  but he is wearing heavy overalls and steel-toed boots. He follows his weight at home and reports it's hovered around 184lb. Reviewed importance of sodium restriction. Will continue current dose of Lasix.  3. AKI on CKD stage III-IV - repeat CMET, Mg today as requested by IM discharge summary. Would likely benefit from referral to nephrology for routine monitoring - await result so we can send with referral. 4. Essential HTN - controlled on present regimen.  Disposition: F/u with Dr. Percival Spanish in 2 months.  Current medicines are reviewed at length with the patient today.  The patient did not have any concerns regarding medicines.  Raechel Ache PA-C 03/16/2015 11:10 AM     [facility] [place] [address] [phone] (office)  [phone] (fax)

## 2015-03-16 ENCOUNTER — Ambulatory Visit (INDEPENDENT_AMBULATORY_CARE_PROVIDER_SITE_OTHER): Payer: BLUE CROSS/BLUE SHIELD | Admitting: Physician Assistant

## 2015-03-16 ENCOUNTER — Encounter: Payer: Self-pay | Admitting: Physician Assistant

## 2015-03-16 VITALS — BP 126/46 | HR 56 | Ht 68.0 in | Wt 191.6 lb

## 2015-03-16 DIAGNOSIS — N183 Chronic kidney disease, stage 3 (moderate): Secondary | ICD-10-CM

## 2015-03-16 DIAGNOSIS — R0600 Dyspnea, unspecified: Secondary | ICD-10-CM

## 2015-03-16 DIAGNOSIS — I5032 Chronic diastolic (congestive) heart failure: Secondary | ICD-10-CM

## 2015-03-16 DIAGNOSIS — I10 Essential (primary) hypertension: Secondary | ICD-10-CM

## 2015-03-16 DIAGNOSIS — I480 Paroxysmal atrial fibrillation: Secondary | ICD-10-CM | POA: Diagnosis not present

## 2015-03-16 DIAGNOSIS — Z79899 Other long term (current) drug therapy: Secondary | ICD-10-CM

## 2015-03-16 DIAGNOSIS — N179 Acute kidney failure, unspecified: Secondary | ICD-10-CM

## 2015-03-16 DIAGNOSIS — I110 Hypertensive heart disease with heart failure: Secondary | ICD-10-CM | POA: Diagnosis not present

## 2015-03-16 DIAGNOSIS — R001 Bradycardia, unspecified: Secondary | ICD-10-CM | POA: Diagnosis not present

## 2015-03-16 LAB — COMPREHENSIVE METABOLIC PANEL
ALBUMIN: 3.3 g/dL — AB (ref 3.6–5.1)
ALK PHOS: 113 U/L (ref 40–115)
ALT: 61 U/L — AB (ref 9–46)
AST: 37 U/L — AB (ref 10–35)
BILIRUBIN TOTAL: 0.5 mg/dL (ref 0.2–1.2)
BUN: 59 mg/dL — ABNORMAL HIGH (ref 7–25)
CALCIUM: 9.1 mg/dL (ref 8.6–10.3)
CO2: 27 mmol/L (ref 20–31)
Chloride: 95 mmol/L — ABNORMAL LOW (ref 98–110)
Creat: 2.57 mg/dL — ABNORMAL HIGH (ref 0.70–1.11)
Glucose, Bld: 205 mg/dL — ABNORMAL HIGH (ref 65–99)
POTASSIUM: 4.7 mmol/L (ref 3.5–5.3)
Sodium: 133 mmol/L — ABNORMAL LOW (ref 135–146)
Total Protein: 6.3 g/dL (ref 6.1–8.1)

## 2015-03-16 LAB — MAGNESIUM: MAGNESIUM: 2.1 mg/dL (ref 1.5–2.5)

## 2015-03-16 NOTE — Addendum Note (Signed)
Addended by: Eulis Foster on: 03/16/2015 12:16 PM   Modules accepted: Orders

## 2015-03-16 NOTE — Patient Instructions (Addendum)
Medication Instructions:  Your physician has recommended you make the following change in your medication:  1.  AMIODARONE 200 mg taking 2 tablet twice a day until 03/20/15 then 03-21-15 start 200 mg taking 1 tablet twice a day for 2 weeks then 04-04-15 take 200 mg tablet 1 time a day   Labwork: TODAY:  CMET                MAGNESIUM                CBC W/DIFF  Testing/Procedures: Your physician has recommended that you have a pulmonary function test. Pulmonary Function Tests are a group of tests that measure how well air moves in and out of your lungs.    Follow-Up: Your physician recommends that you schedule a follow-up appointment in: 2 MONTHS WITH DR. HOCHREIN   Any Other Special Instructions Will Be Listed Below (If Applicable).  Pulmonary Function Tests Pulmonary function tests (PFTs) measure how well your lungs are working. The tests can help to identify the causes of lung problems. They can also help your health care provider select the best treatment for you. Your health care provider may order pulmonary function for any of the following reasons:  When an illness involving the lungs is suspected.  To follow changes in your lung function over time if you are known to have a chronic lung disease.  For industrial plant workers to examine the effects of being exposed to chemicals over a long period of time.  To assess lung function prior to surgery or other procedures.  For people who are smokers. Your measured lung function will be compared to the expected lung function of someone with healthy lungs who is similar to you in age, gender, size, and other factors. This is used to determine your "percent predicted" lung function, which is how your health care provider knows if your lung function is normal or abnormal. If you have had prior pulmonary function testing performed, your health care provider will also compare your current results with past tests to see if your lung  function is better, worse, or staying the same. This can sometimes be useful to see if treatments are working.  LET [facility] CARE PROVIDER KNOW ABOUT:  Any allergies you have.  All medicines you are taking, including inhaler or nebulizer medicines, vitamins, herbs, eye drops, creams, and over-the-counter medicines.  Any blood disorders you have.  Previous surgeries you have had, especially recent eye surgery, abdominal surgery, or chest surgery. These can make performing pulmonary function tests difficult or unsafe.  Medical conditions you have.  Chest pain or heart problems.  Tuberculosis or respiratory infections, such as pneumonia, a cold, or the flu. If you think you will have difficulty performing any of the breathing maneuvers, ask your health care provider if you should reschedule the test. RISKS AND COMPLICATIONS: Generally, pulmonary function testing is a safe procedure. However, as with any procedure, complications can occur. Possible complications include:  Lightheadedness due to overbreathing (hyperventilation).  An asthmatic attack from deep breathing. BEFORE THE PROCEDURE  Take medicine as directed by your health care provider. If you take inhaler or nebulizer medicines, ask your health care provider which medicines you should take on the day of your test. Some inhaler medicines may interfere with pulmonary function tests, such as bronchodilator testing, if taken shortly before the test.  Avoid eating a large meal before your test.  Do not smoke before your test.  Wear comfortable clothing which  will not interfere with breathing. PROCEDURE  You will be given a soft nose clip to wear during the procedure. This is done so that all of your breaths will go through your mouth instead of your nose.  You will be given a germ-free (sterile) mouthpiece. It will be attached to a spirometer. The spirometer is the machine that measures your breathing.  You will be  instructed to perform various breathing maneuvers. The maneuvers will be done by breathing in (inhaling) and breathing out (exhaling). Depending on what measurements are ordered, you may be asked to repeat the maneuvers several times before the test is completed.  It is important to follow the instructions exactly to obtain accurate results. Make sure to blow as hard and as fast as you can when you are instructed to do so.  You may be given a bronchodilator after testing has been performed. A bronchodilator is a medicine which makes the small air passages in your lungs larger. These medicines usually make it easier to breathe. The tests are then repeated several minutes later after the bronchodilator has taken effect.  You will be monitored carefully during the procedure for faintness, dizziness, difficulty breathing, or any other problems. AFTER THE PROCEDURE   You may resume your usual diet, medicines, and activities as directed by your health care provider.  Your health care provider will go over your test results with you and determine what treatments may be helpful.   This information is not intended to replace advice given to you by your health care provider. Make sure you discuss any questions you have with your health care provider.   Document Released: 12/28/2003 Document Revised: 02/24/2013 Document Reviewed: 12/03/2012 Elsevier Interactive Patient Education [employer].   If you need a refill on your cardiac medications before your next appointment, please call your pharmacy.

## 2015-03-16 NOTE — Addendum Note (Signed)
Addended by: Eulis Foster on: 03/16/2015 12:17 PM   Modules accepted: Orders

## 2015-03-17 ENCOUNTER — Other Ambulatory Visit: Payer: Self-pay | Admitting: Internal Medicine

## 2015-03-17 ENCOUNTER — Ambulatory Visit
Admission: RE | Admit: 2015-03-17 | Discharge: 2015-03-17 | Disposition: A | Discharge status: Home / Self Care | Payer: BLUE CROSS/BLUE SHIELD | Source: Ambulatory Visit | Attending: Internal Medicine | Admitting: Internal Medicine

## 2015-03-17 DIAGNOSIS — D72829 Elevated white blood cell count, unspecified: Secondary | ICD-10-CM

## 2015-03-17 LAB — CBC WITH DIFFERENTIAL/PLATELET
BASOS ABS: 0 10*3/uL (ref 0.0–0.1)
BASOS PCT: 0 % (ref 0–1)
EOS ABS: 0.2 10*3/uL (ref 0.0–0.7)
Eosinophils Relative: 1 % (ref 0–5)
HCT: 35.5 % — ABNORMAL LOW (ref 39.0–52.0)
Hemoglobin: 11.5 g/dL — ABNORMAL LOW (ref 13.0–17.0)
Lymphocytes Relative: 5 % — ABNORMAL LOW (ref 12–46)
Lymphs Abs: 0.8 10*3/uL (ref 0.7–4.0)
MCH: 30.6 pg (ref 26.0–34.0)
MCHC: 32.4 g/dL (ref 30.0–36.0)
MCV: 94.4 fL (ref 78.0–100.0)
MPV: 12.1 fL (ref 8.6–12.4)
Monocytes Absolute: 1.8 10*3/uL — ABNORMAL HIGH (ref 0.1–1.0)
Monocytes Relative: 12 % (ref 3–12)
NEUTROS PCT: 82 % — AB (ref 43–77)
Neutro Abs: 12.3 10*3/uL — ABNORMAL HIGH (ref 1.7–7.7)
Platelets: 355 10*3/uL (ref 150–400)
RBC: 3.76 MIL/uL — AB (ref 4.22–5.81)
RDW: 13.5 % (ref 11.5–15.5)
WBC: 15 10*3/uL — AB (ref 4.0–10.5)

## 2015-03-21 DIAGNOSIS — I5030 Unspecified diastolic (congestive) heart failure: Secondary | ICD-10-CM | POA: Diagnosis not present

## 2015-03-21 DIAGNOSIS — Z794 Long term (current) use of insulin: Secondary | ICD-10-CM | POA: Diagnosis not present

## 2015-03-21 DIAGNOSIS — I480 Paroxysmal atrial fibrillation: Secondary | ICD-10-CM | POA: Diagnosis not present

## 2015-03-21 DIAGNOSIS — E1139 Type 2 diabetes mellitus with other diabetic ophthalmic complication: Secondary | ICD-10-CM | POA: Diagnosis not present

## 2015-03-21 DIAGNOSIS — D72829 Elevated white blood cell count, unspecified: Secondary | ICD-10-CM | POA: Diagnosis not present

## 2015-03-21 DIAGNOSIS — I129 Hypertensive chronic kidney disease with stage 1 through stage 4 chronic kidney disease, or unspecified chronic kidney disease: Secondary | ICD-10-CM | POA: Diagnosis not present

## 2015-03-21 DIAGNOSIS — Z7901 Long term (current) use of anticoagulants: Secondary | ICD-10-CM | POA: Diagnosis not present

## 2015-03-21 DIAGNOSIS — Z5181 Encounter for therapeutic drug level monitoring: Secondary | ICD-10-CM | POA: Diagnosis not present

## 2015-03-27 DIAGNOSIS — N183 Chronic kidney disease, stage 3 (moderate): Secondary | ICD-10-CM | POA: Diagnosis not present

## 2015-03-27 DIAGNOSIS — Z5181 Encounter for therapeutic drug level monitoring: Secondary | ICD-10-CM | POA: Diagnosis not present

## 2015-03-27 DIAGNOSIS — E1122 Type 2 diabetes mellitus with diabetic chronic kidney disease: Secondary | ICD-10-CM | POA: Diagnosis not present

## 2015-03-27 DIAGNOSIS — Z7901 Long term (current) use of anticoagulants: Secondary | ICD-10-CM | POA: Diagnosis not present

## 2015-03-27 DIAGNOSIS — I129 Hypertensive chronic kidney disease with stage 1 through stage 4 chronic kidney disease, or unspecified chronic kidney disease: Secondary | ICD-10-CM | POA: Diagnosis not present

## 2015-03-28 ENCOUNTER — Encounter: Payer: Self-pay | Admitting: Physician Assistant

## 2015-03-28 NOTE — Progress Notes (Signed)
We did not get a copy of the PCP appointment from last week but PCP did fax OV from 11/7. Per their note, he was doing much better at follow-up with weight down 10 lbs, back on Lasix once daily, appetite better. They indicate plan to continue Lasix once a day, hold fosinopril and hydralazine, and start lisinopril 10mg  daily with recheck BMET in 2 weeks. His most recent Cr reported was down to 1.99. We offered the patient sooner f/u back in clinic here for recheck but he declined. Has f/u with Dr. Percival Spanish next month. The only thing I would ask is that they also follow his LFTs since these were recently abnormal as well (is on statin + amio and needs to be followed). Will forward this to the nurse to see if she can call Dr. Delene Ruffini office to give them a heads up to add LFTs to f/u labs.  Mahek Schlesinger PA-C

## 2015-03-28 NOTE — Patient Instructions (Addendum)
Left message for Dr. Delene Ruffini office to call back. Will route message to Dr. Laurann Montana so he is aware.   "We did not get a copy of the PCP appointment from last week but PCP did fax OV from 11/7. Per their note, he was doing much better at follow-up with weight down 10 lbs, back on Lasix once daily, appetite better. They indicate plan to continue Lasix once a day, hold fosinopril and hydralazine, and start lisinopril 10mg  daily with recheck BMET in 2 weeks. His most recent Cr reported was down to 1.99. We offered the patient sooner f/u back in clinic here for recheck but he declined. Has f/u with Dr. Percival Spanish next month. The only thing I would ask is that they also follow his LFTs since these were recently abnormal as well (is on statin + amio and needs to be followed). Will forward this to the nurse to see if she can call Dr. Delene Ruffini office to give them a heads up to add LFTs to f/u labs."  Dayna Dunn PA-C  Spoke with Blanch Media, Dr. Delene Ruffini nurse. She stated that patient's LFT's were in the labs sent on 03/17/2015 and that she will have Dr. Laurann Montana review them.

## 2015-03-30 ENCOUNTER — Encounter: Payer: Self-pay | Admitting: Cardiology

## 2015-04-10 DIAGNOSIS — Z7901 Long term (current) use of anticoagulants: Secondary | ICD-10-CM | POA: Diagnosis not present

## 2015-04-26 ENCOUNTER — Ambulatory Visit (INDEPENDENT_AMBULATORY_CARE_PROVIDER_SITE_OTHER): Payer: BLUE CROSS/BLUE SHIELD | Admitting: Internal Medicine

## 2015-04-26 DIAGNOSIS — I5032 Chronic diastolic (congestive) heart failure: Secondary | ICD-10-CM

## 2015-04-26 DIAGNOSIS — R001 Bradycardia, unspecified: Secondary | ICD-10-CM

## 2015-04-26 DIAGNOSIS — R0600 Dyspnea, unspecified: Secondary | ICD-10-CM | POA: Diagnosis not present

## 2015-04-26 DIAGNOSIS — I110 Hypertensive heart disease with heart failure: Secondary | ICD-10-CM

## 2015-04-26 DIAGNOSIS — N179 Acute kidney failure, unspecified: Secondary | ICD-10-CM

## 2015-04-26 DIAGNOSIS — I10 Essential (primary) hypertension: Secondary | ICD-10-CM

## 2015-04-26 DIAGNOSIS — N183 Chronic kidney disease, stage 3 (moderate): Secondary | ICD-10-CM

## 2015-04-26 DIAGNOSIS — Z79899 Other long term (current) drug therapy: Secondary | ICD-10-CM

## 2015-04-26 DIAGNOSIS — I480 Paroxysmal atrial fibrillation: Secondary | ICD-10-CM

## 2015-04-26 LAB — PULMONARY FUNCTION TEST
DL/VA % pred: 93 %
DL/VA: 3.88 ml/min/mmHg/L
DLCO UNC: 13.06 ml/min/mmHg
DLCO unc % pred: 53 %
FEF 25-75 Post: 1.37 L/sec
FEF 25-75 Pre: 1.28 L/sec
FEF2575-%CHANGE-POST: 6 %
FEF2575-%PRED-PRE: 93 %
FEF2575-%Pred-Post: 100 %
FEV1-%CHANGE-POST: 6 %
FEV1-%PRED-POST: 63 %
FEV1-%Pred-Pre: 59 %
FEV1-POST: 1.33 L
FEV1-PRE: 1.25 L
FEV1FVC-%CHANGE-POST: 4 %
FEV1FVC-%Pred-Pre: 110 %
FEV6-%CHANGE-POST: 3 %
FEV6-%Pred-Post: 58 %
FEV6-%Pred-Pre: 56 %
FEV6-PRE: 1.56 L
FEV6-Post: 1.62 L
FEV6FVC-%Pred-Post: 108 %
FEV6FVC-%Pred-Pre: 108 %
FVC-%Change-Post: 2 %
FVC-%Pred-Post: 53 %
FVC-%Pred-Pre: 52 %
FVC-Post: 1.62 L
FVC-Pre: 1.59 L
POST FEV1/FVC RATIO: 82 %
PRE FEV6/FVC RATIO: 100 %
Post FEV6/FVC ratio: 100 %
Pre FEV1/FVC ratio: 79 %

## 2015-04-26 NOTE — Progress Notes (Signed)
PFT done today. 

## 2015-05-04 ENCOUNTER — Telehealth: Payer: Self-pay

## 2015-05-04 DIAGNOSIS — R942 Abnormal results of pulmonary function studies: Secondary | ICD-10-CM

## 2015-05-04 NOTE — Telephone Encounter (Signed)
Called patient and informed him of his abnormal PFT. Also informed patient that Dr. Percival Spanish wants to refer patient to pulmonology per Melina Copa PA. Will put in referral. Patient verbalized understanding.

## 2015-05-09 ENCOUNTER — Ambulatory Visit (INDEPENDENT_AMBULATORY_CARE_PROVIDER_SITE_OTHER): Payer: BLUE CROSS/BLUE SHIELD | Admitting: Cardiology

## 2015-05-09 ENCOUNTER — Encounter: Payer: Self-pay | Admitting: Cardiology

## 2015-05-09 VITALS — BP 162/54 | HR 52 | Ht 64.0 in | Wt 183.0 lb

## 2015-05-09 DIAGNOSIS — R0600 Dyspnea, unspecified: Secondary | ICD-10-CM | POA: Diagnosis not present

## 2015-05-09 NOTE — Patient Instructions (Signed)
Your physician recommends that you schedule a follow-up appointment in: 3 Months  

## 2015-05-09 NOTE — Progress Notes (Addendum)
HPI The patient presents for follow up of dyspnea and fatigue.   After the first visit I sent him for [facility].  This was negative for ischemia.    EF was normal.  He was found on PFTs to have severe restriction and moderately severe diffusion defect.  He has an appointment for evaluation in the pulmonary clinic in January.   He was admitted in 02/18/15.  He had atrial fibrillation. There was some diastolic dysfunction as his echo demonstrated a well preserved ejection fraction. He was treated with heparin and Coumadin. He was ultimately treated with amiodarone. He did develop some renal insufficiency. He's actually feeling back to his prehospital baseline. He's not had any new palpitations, presyncope or syncope. He's not had any chest pressure, neck discomfort or arm discomfort.  He continues to have dyspnea as described previously. His biggest complaint has been fatigue and decreased activity tolerance.  Allergies  Allergen Reactions  . Nifedipine Other (See Comments)    Reaction: made gums swell up. Pt states that he had to have gum surgery after taking.     Current Outpatient Prescriptions  Medication Sig Dispense Refill  . amiodarone (PACERONE) 200 MG tablet Take 2 pills by mouth two times a day for 14 days then 2 pills by mouth daily    . atorvastatin (LIPITOR) 80 MG tablet Take 40 mg by mouth daily.  2  . Cholecalciferol (VITAMIN D-3) 1000 UNITS CAPS Take 1 capsule by mouth daily.    . furosemide (LASIX) 80 MG tablet Take 1 tablet (80 mg total) by mouth daily. 15 tablet 0  . insulin detemir (LEVEMIR) 100 UNIT/ML injection Inject 0.1 mLs (10 Units total) into the skin at bedtime. 10 mL 11  . insulin lispro (HUMALOG) 100 UNIT/ML injection Inject 10-30 Units into the skin 3 (three) times daily before meals.    . metoprolol (LOPRESSOR) 50 MG tablet Take 50 mg by mouth 2 (two) times daily.    Marland Kitchen omeprazole (PRILOSEC) 20 MG capsule Take 20 mg by mouth daily.     . ondansetron (ZOFRAN)  4 MG tablet Take 4 mg by mouth 3 (three) times daily as needed for nausea or vomiting.    . vitamin B-12 (CYANOCOBALAMIN) 1000 MCG tablet Take 1,000 mcg by mouth daily.    Marland Kitchen warfarin (COUMADIN) 2.5 MG tablet Take 1 tablet (2.5 mg total) by mouth one time only at 6 PM. 7 tablet 0   No current facility-administered medications for this visit.    Past Medical History  Diagnosis Date  . Essential hypertension   . Type II diabetes mellitus ([place])   . Pneumonia 2007  . Cervical disc disease   . Dyslipidemia   . CKD (chronic kidney disease), stage III   . GERD (gastroesophageal reflux disease)   . PAF (paroxysmal atrial fibrillation) ([place])     a. Dx 02/2015 - in and out on tele, rx'd Coumadin and amiodarone.  . Chronic diastolic CHF (congestive heart failure) ([place])     a. Dx 02/2015 -  2D echo 03/02/15 showed severe focal basal hypertrophy of the septum, EF 55-60%, mild MR, no effusion.  . Coronary artery calcification seen on CT scan     a. Nuc 02/2015 was normal.  . Bradycardia     a. during 02/2015 admission - occasional HR in 40s while being treated for atrial fib.    Past Surgical History  Procedure Laterality Date  . Eye surgery  1930's    "don't know what  for" (05/11/2013)  . Cholecystectomy    . Cervical disc surgery      "i've had 3 neck ORs; not sure what kind; all thru the back of my neck"  . Posterior fusion cervical spine    . Knee arthroscopy      "had one scope; one open knee OR; not sure which on which side" (05/10/2013)  . Knee arthroplasty      "had one scope; one open knee OR; not sure which on which side" (05/10/2013)  . Rotator cuff repair Bilateral   . Joint replacement      bilateral knees, elbows, and shoulders (on 05/11/2013 pt denies all joint replacements"   . Cataract extraction w/ intraocular lens  implant, bilateral Bilateral     ROS:  As stated in the HPI and negative for all other systems.   PHYSICAL EXAM There were no vitals taken for this  visit.  GENERAL:  Well appearing NECK:  No jugular venous distention, waveform within normal limits, carotid upstroke brisk and symmetric, no bruits, no thyromegaly LYMPHATICS:  No cervical, inguinal adenopathy LUNGS:  Clear to auscultation bilaterally BACK:  No CVA tenderness CHEST:  Unremarkable HEART:  PMI not displaced or sustained,S1 and S2 within normal limits, no S3, no S4, no clicks, no rubs, no murmurs ABD:  Flat, positive bowel sounds normal in frequency in pitch, no bruits, no rebound, no guarding, no midline pulsatile mass, no hepatomegaly, no splenomegaly EXT:  2 plus pulses throughout, no edema, no cyanosis no clubbing SKIN:  No rashes no nodules  EKG:  Sinus rhythm, rate 61, axis within normal limits, first degree AV block, no acute ST-T wave changes. 05/09/2015  ASSESSMENT AND PLAN  DOE/FATIGUE:   He will have follow-up with pulmonary.  One significant question will be can he can continue on the amiodarone given the restriction and diffusion abnormality on pulmonary function testing. I will wait to hear back from the pulmonologist on this.  ATRIAL FIB:    I reviewed the hospital records. She seems to be maintaining sinus rhythm. He is up-to-date with labs as below. No change in therapy is indicated.  Lab Results  Component Value Date   TSH 2.159 03/04/2015   ALT 61* 03/16/2015   AST 37* 03/16/2015   ALKPHOS 113 03/16/2015   BILITOT 0.5 03/16/2015   PROT 6.3 03/16/2015   ALBUMIN 3.3* 03/16/2015     BRADYCARDIA:  I don't suspect that this is contributing as he did have chronotropic competence in the office. However, I'll have a low threshold for monitoring at home to make sure he has a reasonable heart rate response with minimal activities.  DIASTOLIC HF:  He seems to be euvolemic. He will continue with meds as listed.  Hospital records reviewed.

## 2015-06-06 ENCOUNTER — Encounter: Payer: Self-pay | Admitting: Pulmonary Disease

## 2015-06-06 ENCOUNTER — Ambulatory Visit (INDEPENDENT_AMBULATORY_CARE_PROVIDER_SITE_OTHER): Payer: BLUE CROSS/BLUE SHIELD | Admitting: Pulmonary Disease

## 2015-06-06 VITALS — BP 134/76 | HR 52 | Temp 98.4°F | Ht 65.0 in | Wt 192.6 lb

## 2015-06-06 DIAGNOSIS — Z79899 Other long term (current) drug therapy: Secondary | ICD-10-CM | POA: Diagnosis not present

## 2015-06-06 DIAGNOSIS — R0600 Dyspnea, unspecified: Secondary | ICD-10-CM | POA: Diagnosis not present

## 2015-06-06 NOTE — Progress Notes (Signed)
Subjective:    Patient ID: Justin Leach, male    DOB: 1931-11-23, 82 y.o.   MRN: [id number]  HPI Follow-up for evaluation of abnormal PFTs.   Justin Leach is an 82 year old with past medical history as below. He was admitted in October 2016 with new onset atrial fibrillation, diastolic heart failure, acute on chronic renal failure. He was initially on IV Cardizem, metoprolol which was then changed to amiodarone and Coumadin anticoagulation. He subsequently converted to normal sinus rhythm and had remained in sinus rhythm since then. He has a CTA done which did not show pulmonary embolism but basal fibrosis, atelectasis. He had PFTs done recently which showed restriction and reduction in diffusion capacity. He is referred for further evaluation and consideration of amiodarone toxicity.  His chief complaint is dyspnea on exertion. This has been going on for the past 4-5 years, worsened over the past 1 year. He denies any cough, sputum, fevers, chills.  Social history: Is a never smoker, no alcohol or illegal drug  use. He worked in [employer]. He does not report any exposures to asbestos or other chemicals. He is married and lives with his spouse.  Family history: Father-heart disease   Past Medical History  Diagnosis Date  . Essential hypertension   . Type II diabetes mellitus ([place])   . Pneumonia 2007  . Cervical disc disease   . Dyslipidemia   . CKD (chronic kidney disease), stage III   . GERD (gastroesophageal reflux disease)   . PAF (paroxysmal atrial fibrillation) ([place])     a. Dx 02/2015 - in and out on tele, rx'd Coumadin and amiodarone.  . Chronic diastolic CHF (congestive heart failure) ([place])     a. Dx 02/2015 -  2D echo 03/02/15 showed severe focal basal hypertrophy of the septum, EF 55-60%, mild MR, no effusion.  . Coronary artery calcification seen on CT scan     a. Nuc 02/2015 was normal.  . Bradycardia     a. during 02/2015 admission - occasional HR in 40s while  being treated for atrial fib.     Current outpatient prescriptions:  .  amiodarone (PACERONE) 200 MG tablet, Take 200 mg by mouth daily., Disp: , Rfl:  .  atorvastatin (LIPITOR) 80 MG tablet, Take 40 mg by mouth daily., Disp: , Rfl: 2 .  Cholecalciferol (VITAMIN D-3) 1000 UNITS CAPS, Take 1 capsule by mouth daily., Disp: , Rfl:  .  furosemide (LASIX) 40 MG tablet, Take 40 mg by mouth daily., Disp: , Rfl:  .  insulin detemir (LEVEMIR) 100 UNIT/ML injection, Inject 0.1 mLs (10 Units total) into the skin at bedtime., Disp: 10 mL, Rfl: 11 .  insulin lispro (HUMALOG) 100 UNIT/ML injection, Inject 10-30 Units into the skin 3 (three) times daily before meals., Disp: , Rfl:  .  metoprolol (LOPRESSOR) 50 MG tablet, Take 50 mg by mouth 2 (two) times daily., Disp: , Rfl:  .  omeprazole (PRILOSEC) 20 MG capsule, Take 20 mg by mouth daily. , Disp: , Rfl:  .  vitamin B-12 (CYANOCOBALAMIN) 1000 MCG tablet, Take 1,000 mcg by mouth daily., Disp: , Rfl:  .  warfarin (COUMADIN) 2.5 MG tablet, Take 1 tablet (2.5 mg total) by mouth one time only at 6 PM., Disp: 7 tablet, Rfl: 0  DATA: PFTs 04/26/15 FVC 1.59 [52%) FEV1 1.25 (59%) F/F 79 TLC 2.69 [45%) DLCO 53% Severe restriction, moderate reduction in diffusion capacity. Diffusion capacity corrects for alveolar volume.  CTA 02/28/15 No PE, scarring,  atelectasis at lung bases  Review of Systems Dyspnea and exertion, denies any cough, sputum production, wheezing, hemoptysis. Denies any chest pain, palpitation. Denies any nausea, vomiting, diarrhea, constipation. Denies any fevers, chills, fatigue, malaise. All other review of systems are negative    Objective:   Physical Exam Blood pressure 134/76, pulse 52, temperature 98.4 F (36.9 C), temperature source Oral, height 5\' 5"  (1.651 m), weight 192 lb 9.6 oz (87.363 kg), SpO2 95 %.  Gen:No apparent distress Neuro: No gross focal deficits. Neck: No JVD, lymphadenopathy, thyromegaly. RS: Clear, no  wheeze, crackles. CVS: S1-S2 heard, no murmurs rubs gallops. Abdomen: Soft, positive bowel sounds. Extremities: No edema.     Assessment & Plan:  Dyspnea on exertion. Restrictive lung disease. Amiodarone use.  Justin Leach has been on amiodarone for the past 2 months after he was admitted for heart failure, onset A. fib. He appears to be in sinus rhythm now without any major issues. His recent PFT show restriction and reduction in diffusion capacity. However the diffusion capacity corrects for alveolar volume. I suspect that his symptoms of dyspnea and PFT findings may be largely related to his body habitus, overweight, deconditioning. His CT scan from Oct. 16 does not show any major interstitial abnormality except for bibasilar atelectasis, scarring.   I will repeat his CT scan to see if there is any change since he has started on the amiodarone. If there is evidence of new abnormality then we may have to stop the amiodarone. However he will continue this for now.  Return to clinic in a month to discuss CT scan findings and further follow-up.  Plan: - High res CT of the chest.  Marshell [name] MD [facility] Pager [phone] If no answer or after 3pm call: [phone] 06/06/2015, 4:55 PM

## 2015-06-06 NOTE — Patient Instructions (Signed)
We will set up for a CT of the chest. Return to clinic in a month to discuss results of the scan and further follow-up.

## 2015-06-12 ENCOUNTER — Ambulatory Visit (INDEPENDENT_AMBULATORY_CARE_PROVIDER_SITE_OTHER)
Admission: RE | Admit: 2015-06-12 | Discharge: 2015-06-12 | Disposition: A | Discharge status: Home / Self Care | Payer: BLUE CROSS/BLUE SHIELD | Source: Ambulatory Visit | Attending: Pulmonary Disease | Admitting: Pulmonary Disease

## 2015-06-12 DIAGNOSIS — R911 Solitary pulmonary nodule: Secondary | ICD-10-CM | POA: Diagnosis not present

## 2015-06-12 DIAGNOSIS — R0600 Dyspnea, unspecified: Secondary | ICD-10-CM | POA: Diagnosis not present

## 2015-06-12 DIAGNOSIS — Z79899 Other long term (current) drug therapy: Secondary | ICD-10-CM | POA: Diagnosis not present

## 2015-06-15 DIAGNOSIS — I129 Hypertensive chronic kidney disease with stage 1 through stage 4 chronic kidney disease, or unspecified chronic kidney disease: Secondary | ICD-10-CM | POA: Diagnosis not present

## 2015-06-15 DIAGNOSIS — Z7901 Long term (current) use of anticoagulants: Secondary | ICD-10-CM | POA: Diagnosis not present

## 2015-06-15 DIAGNOSIS — E1122 Type 2 diabetes mellitus with diabetic chronic kidney disease: Secondary | ICD-10-CM | POA: Diagnosis not present

## 2015-06-15 DIAGNOSIS — E113519 Type 2 diabetes mellitus with proliferative diabetic retinopathy with macular edema, unspecified eye: Secondary | ICD-10-CM | POA: Diagnosis not present

## 2015-06-15 DIAGNOSIS — E1139 Type 2 diabetes mellitus with other diabetic ophthalmic complication: Secondary | ICD-10-CM | POA: Diagnosis not present

## 2015-06-15 DIAGNOSIS — Z794 Long term (current) use of insulin: Secondary | ICD-10-CM | POA: Diagnosis not present

## 2015-06-15 DIAGNOSIS — K219 Gastro-esophageal reflux disease without esophagitis: Secondary | ICD-10-CM | POA: Diagnosis not present

## 2015-06-15 DIAGNOSIS — N183 Chronic kidney disease, stage 3 (moderate): Secondary | ICD-10-CM | POA: Diagnosis not present

## 2015-06-15 DIAGNOSIS — R0609 Other forms of dyspnea: Secondary | ICD-10-CM | POA: Diagnosis not present

## 2015-07-14 ENCOUNTER — Other Ambulatory Visit (INDEPENDENT_AMBULATORY_CARE_PROVIDER_SITE_OTHER): Payer: BLUE CROSS/BLUE SHIELD

## 2015-07-14 ENCOUNTER — Ambulatory Visit (INDEPENDENT_AMBULATORY_CARE_PROVIDER_SITE_OTHER): Payer: BLUE CROSS/BLUE SHIELD | Admitting: Pulmonary Disease

## 2015-07-14 ENCOUNTER — Encounter: Payer: Self-pay | Admitting: Pulmonary Disease

## 2015-07-14 VITALS — BP 136/68 | HR 50 | Ht 65.0 in | Wt 192.0 lb

## 2015-07-14 DIAGNOSIS — R0600 Dyspnea, unspecified: Secondary | ICD-10-CM

## 2015-07-14 LAB — C-REACTIVE PROTEIN: CRP: 0.4 mg/dL — ABNORMAL LOW (ref 0.5–20.0)

## 2015-07-14 LAB — RHEUMATOID FACTOR: Rhuematoid fact SerPl-aCnc: <10 IU/mL (ref <=14)

## 2015-07-14 LAB — SEDIMENTATION RATE: Sed Rate: 21 mm/hr (ref 0–22)

## 2015-07-14 NOTE — Patient Instructions (Signed)
We will get some blood tests done today to evaluate lung disease We will repeat the CT of the chest high resolution in about 10 months  Return to clinic in 6 months

## 2015-07-14 NOTE — Progress Notes (Signed)
Subjective:    Patient ID: Justin Leach, male    DOB: 05/02/1932, 82 y.o.   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   HPI Justin Leach is a 82 year old with past medical history as below. He was admitted in October 2016 with new onset atrial fibrillation, diastolic heart failure, acute on chronic renal failure. He was initially on IV Cardizem, metoprolol which was then changed to amiodarone and Coumadin anticoagulation. He subsequently converted to normal sinus rhythm and had remained in sinus rhythm since then. He has a CTA done which did not show pulmonary embolism but basal fibrosis, atelectasis. PFTs which showed restriction and reduction in diffusion capacity. Repeat CT scan in Jan 17 shows findings concerning for amiodarone toxicity vs NSIP.   His chief complaint is dyspnea on exertion. This has been going on for the past 4-5 years, worsened over the past 1 year. He denies any cough, sputum, fevers, chills.  Social history: Is a never smoker, no alcohol or illegal drug  use. He worked in [employer]. He does not report any exposures to asbestos or other chemicals. He is married and lives with his spouse.  Family history: Father-heart disease   Past Medical History  Diagnosis Date  . Essential hypertension   . Type II diabetes mellitus ([place])   . Pneumonia 2007  . Cervical disc disease   . Dyslipidemia   . CKD (chronic kidney disease), stage III   . GERD (gastroesophageal reflux disease)   . PAF (paroxysmal atrial fibrillation) ([place])     a. Dx 02/2015 - in and out on tele, rx'd Coumadin and amiodarone.  . Chronic diastolic CHF (congestive heart failure) ([place])     a. Dx 02/2015 -  2D echo 03/02/15 showed severe focal basal hypertrophy of the septum, EF 55-60%, mild MR, no effusion.  . Coronary artery calcification seen on CT scan     a. Nuc 02/2015 was normal.    . Bradycardia     a. during 02/2015 admission - occasional HR in 40s while being treated for atrial fib.     Current outpatient prescriptions:  .  atorvastatin (LIPITOR) 80 MG tablet, Take 40 mg by mouth daily., Disp: , Rfl: 2 .  Cholecalciferol (VITAMIN D-3) 1000 UNITS CAPS, Take 1 capsule by mouth daily., Disp: , Rfl:  .  furosemide (LASIX) 40 MG tablet, Take 40 mg by mouth daily., Disp: , Rfl:  .  insulin detemir (LEVEMIR) 100 UNIT/ML injection, Inject 0.1 mLs (10 Units total) into the skin at bedtime., Disp: 10 mL, Rfl: 11 .  insulin lispro (HUMALOG) 100 UNIT/ML injection, Inject 10-30 Units into the skin 3 (three) times daily before meals., Disp: , Rfl:  .  metoprolol (LOPRESSOR) 50 MG tablet, Take 50 mg by mouth 2 (two) times daily., Disp: , Rfl:  .  omeprazole (PRILOSEC) 20 MG capsule, Take 20 mg by mouth daily. , Disp: , Rfl:  .  vitamin B-12 (CYANOCOBALAMIN) 1000 MCG tablet, Take 1,000 mcg by mouth daily., Disp: , Rfl:  .  warfarin (COUMADIN) 2.5 MG tablet, Take 1 tablet (2.5 mg total) by mouth one time only at 6 PM., Disp: 7 tablet, Rfl: 0  DATA: PFTs 04/26/15 FVC 1.59 [52%) FEV1 1.25 (59%) F/F 79 TLC 2.69 [45%) DLCO 53% Severe restriction, moderate reduction in diffusion capacity. Diffusion capacity corrects for alveolar volume.  CTA 02/28/15 No PE, scarring, atelectasis at  lung bases.  CT chest 06/12/15 Images reviewed. Subpleural reticulation, groundglass, 7 mm right lower lobe nodule  Review of Systems Dyspnea and exertion, denies any cough, sputum production, wheezing, hemoptysis. Denies any chest pain, palpitation. Denies any nausea, vomiting, diarrhea, constipation. Denies any fevers, chills, fatigue, malaise. All other review of systems are negative    Objective:   Physical Exam Blood pressure 136/68, pulse 50, height 5\' 5"  (1.651 m), weight 192 lb (87.091 kg), SpO2 98 %. Gen:No apparent distress  Neuro: No gross focal deficits. Neck: No JVD,  lymphadenopathy, thyromegaly. RS: Clear, no wheeze, crackles. CVS: S1-S2 heard, no murmurs rubs gallops. Abdomen: Soft, positive bowel sounds. Extremities: No edema.     Assessment & Plan:  Follow up for dyspnea on exertion with restrictive lung disease and abnormal CT scan.   Justin Leach was been on amiodarone after he was admitted for heart failure, onset A. Fib in Oct 2016. His  PFT show restriction and reduction in diffusion capacity. However the diffusion capacity corrects for alveolar volume. CT scan does show lower lobe changes that may represent amiodarone toxicity vs NSIP but the extent of the changes appear very mild and not much different from CT images before he was started on amiodarone.   He has since been taken off the amiodarone. We will continue to monitor with repeat CT scan in about 10 months with prone image to distinguish early interstitial lung disease from benign dependent changes. This will enable [place] to follow the 7 mm pulmonary nodule as well.  I will send off blood work for autoimmune and connective tissue disease.    Plan: - Repeat CT scan with prone images - Blood work for autoimmune and connective tissue disease.   Marshell [name] MD [place] [facility] Pager [phone] If no answer or after 3pm call: [phone] 07/14/2015, 12:27 PM

## 2015-07-17 ENCOUNTER — Telehealth: Payer: Self-pay | Admitting: Pulmonary Disease

## 2015-07-17 LAB — SJOGRENS SYNDROME-A EXTRACTABLE NUCLEAR ANTIBODY: SSA (Ro) (ENA) Antibody, IgG: <1

## 2015-07-17 LAB — CENTROMERE ANTIBODIES: Centromere Ab Screen: <1

## 2015-07-17 LAB — ANTI-DNA ANTIBODY, DOUBLE-STRANDED: ds DNA Ab: <1 IU/mL

## 2015-07-17 LAB — ALDOLASE: Aldolase: 6.2 U/L (ref <=8.1)

## 2015-07-17 LAB — ANCA SCREEN W REFLEX TITER: ANCA Screen: NEGATIVE

## 2015-07-17 LAB — JO-1 ANTIBODY-IGG: Jo-1 Antibody, IgG: <1

## 2015-07-17 LAB — ANTI-SMITH ANTIBODY: ENA SM Ab Ser-aCnc: <1

## 2015-07-17 LAB — RNP ANTIBODY: RIBONUCLEIC PROTEIN(ENA) ANTIBODY, IGG: NEGATIVE

## 2015-07-17 LAB — CYCLIC CITRUL PEPTIDE ANTIBODY, IGG

## 2015-07-17 LAB — SJOGRENS SYNDROME-B EXTRACTABLE NUCLEAR ANTIBODY: SSB (La) (ENA) Antibody, IgG: <1

## 2015-07-17 LAB — ANTI-SCLERODERMA ANTIBODY: SCLERODERMA (SCL-70) (ENA) ANTIBODY, IGG: NEGATIVE

## 2015-07-17 MED ORDER — LISINOPRIL 10 MG PO TABS: 10.0000 mg | ORAL_TABLET | Freq: Every day | ORAL | Status: DC

## 2015-07-17 NOTE — Telephone Encounter (Signed)
Per patient's request, added Lisinopril to patient's med list. Patient aware that this has been fixed. Nothing further needed.

## 2015-07-19 LAB — MYOSITIS PANEL III

## 2015-07-19 LAB — ANA W/REFLEX: Anti Nuclear Antibody(ANA): NEGATIVE

## 2015-08-09 NOTE — Progress Notes (Signed)
HPI The patient presents for follow up of dyspnea and fatigue.   He has had a [employer].  This was negative for ischemia.    EF was normal.  He was found on PFTs to have severe restriction and moderately severe diffusion defect.  He has been seen in the pulmonary clinic.  We stopped amiodarone.  This had been used to treat atrial fibrillation.  This was started last year and he was only on this for short period.  He has had diastolic dysfunction with heart failure.   Since I saw hme he has had continued dyspnea with exertion.  He feels very fatigued doing mild activities.  He chronically sleeps in a chair because of dizziness.  He is not describing PND or orthopnea.  He has had no recurrent palpitations.     Allergies  Allergen Reactions  . Nifedipine Other (See Comments)    Reaction: made gums swell up. Pt states that he had to have gum surgery after taking.     Current Outpatient Prescriptions  Medication Sig Dispense Refill  . atorvastatin (LIPITOR) 80 MG tablet Take 40 mg by mouth daily.  2  . Cholecalciferol (VITAMIN D-3) 1000 UNITS CAPS Take 1 capsule by mouth daily.    . furosemide (LASIX) 40 MG tablet Take 40 mg by mouth daily.    . insulin detemir (LEVEMIR) 100 UNIT/ML injection Inject 0.1 mLs (10 Units total) into the skin at bedtime. 10 mL 11  . insulin lispro (HUMALOG) 100 UNIT/ML injection Inject 10-30 Units into the skin 3 (three) times daily before meals.    Marland Kitchen lisinopril (PRINIVIL,ZESTRIL) 10 MG tablet Take 1 tablet (10 mg total) by mouth daily.    . metoprolol (LOPRESSOR) 50 MG tablet Take 50 mg by mouth 2 (two) times daily.    Marland Kitchen omeprazole (PRILOSEC) 20 MG capsule Take 20 mg by mouth daily.     . vitamin B-12 (CYANOCOBALAMIN) 1000 MCG tablet Take 1,000 mcg by mouth daily.    Marland Kitchen warfarin (COUMADIN) 2.5 MG tablet Take 1 tablet (2.5 mg total) by mouth one time only at 6 PM. 7 tablet 0   No current facility-administered medications for this visit.    Past Medical  History  Diagnosis Date  . Essential hypertension   . Type II diabetes mellitus ([place])   . Pneumonia 2007  . Cervical disc disease   . Dyslipidemia   . CKD (chronic kidney disease), stage III   . GERD (gastroesophageal reflux disease)   . PAF (paroxysmal atrial fibrillation) ([place])     a. Dx 02/2015 - in and out on tele, rx'd Coumadin and amiodarone.  . Chronic diastolic CHF (congestive heart failure) ([place])     a. Dx 02/2015 -  2D echo 03/02/15 showed severe focal basal hypertrophy of the septum, EF 55-60%, mild MR, no effusion.  . Coronary artery calcification seen on CT scan     a. Nuc 02/2015 was normal.  . Bradycardia     a. during 02/2015 admission - occasional HR in 40s while being treated for atrial fib.    Past Surgical History  Procedure Laterality Date  . Eye surgery  1930's    "don't know what for" (05/11/2013)  . Cholecystectomy    . Cervical disc surgery      "i've had 3 neck ORs; not sure what kind; all thru the back of my neck"  . Posterior fusion cervical spine    . Knee arthroscopy      "had  one scope; one open knee OR; not sure which on which side" (05/10/2013)  . Knee arthroplasty      "had one scope; one open knee OR; not sure which on which side" (05/10/2013)  . Rotator cuff repair Bilateral   . Joint replacement      bilateral knees, elbows, and shoulders (on 05/11/2013 pt denies all joint replacements"   . Cataract extraction w/ intraocular lens  implant, bilateral Bilateral     ROS:  Drools.  Otherwise as stated in the HPI and negative for all other systems.   PHYSICAL EXAM BP 148/60 mmHg  Pulse 58  Ht 5\' 8"  (1.727 m)  Wt 193 lb 6.4 oz (87.726 kg)  BMI 29.41 kg/m2  GENERAL:  Well appearing NECK:  No jugular venous distention, waveform within normal limits, carotid upstroke brisk and symmetric, no bruits, no thyromegaly LYMPHATICS:  No cervical, inguinal adenopathy LUNGS:  Clear to auscultation bilaterally BACK:  No CVA tenderness CHEST:   Unremarkable HEART:  PMI not displaced or sustained,S1 and S2 within normal limits, no S3, no S4, no clicks, no rubs, no murmurs ABD:  Flat, positive bowel sounds normal in frequency in pitch, no bruits, no rebound, no guarding, no midline pulsatile mass, no hepatomegaly, no splenomegaly EXT:  2 plus pulses throughout, trace ankle edema, no cyanosis no clubbing SKIN:  No rashes no nodules   ASSESSMENT AND PLAN  DOE/FATIGUE:   I do not strongly suspect that this is cardiac.  However, I will check a BNP level.  For now no further cardiac work up is planned.    ATRIAL FIB:    He seems to be maintaining sinus rhythm.    He will remain on the anticoagulation.    BRADYCARDIA:  I don't suspect that this is contributing as he did have chronotropic competence in the office. However, I'll have a low threshold for monitoring at home to make sure he has a reasonable heart rate response with minimal activities.  DIASTOLIC HF:  He seems to be euvolemic.  I will check the BNP as above.

## 2015-08-10 ENCOUNTER — Ambulatory Visit (INDEPENDENT_AMBULATORY_CARE_PROVIDER_SITE_OTHER): Payer: BLUE CROSS/BLUE SHIELD | Admitting: Cardiology

## 2015-08-10 ENCOUNTER — Encounter: Payer: Self-pay | Admitting: Cardiology

## 2015-08-10 VITALS — BP 148/60 | HR 58 | Ht 68.0 in | Wt 193.4 lb

## 2015-08-10 DIAGNOSIS — R0602 Shortness of breath: Secondary | ICD-10-CM | POA: Diagnosis not present

## 2015-08-10 LAB — BRAIN NATRIURETIC PEPTIDE: BRAIN NATRIURETIC PEPTIDE: 167.8 pg/mL — AB (ref <100)

## 2015-08-10 NOTE — Patient Instructions (Signed)
Your physician wants you to follow-up in: 6 Months. You will receive a reminder letter in the mail two months in advance. If you don't receive a letter, please call our office to schedule the follow-up appointment.  Your physician recommends that you return for lab work in: BNP

## 2015-08-21 ENCOUNTER — Telehealth: Payer: Self-pay | Admitting: Cardiology

## 2015-08-21 NOTE — Telephone Encounter (Signed)
New Message  Pt states that he is returning the call. Going to [place] please call the mobile number

## 2015-08-21 NOTE — Telephone Encounter (Signed)
Pt aware of results, aware to call for further needs.

## 2015-08-28 DIAGNOSIS — L821 Other seborrheic keratosis: Secondary | ICD-10-CM | POA: Diagnosis not present

## 2015-08-28 DIAGNOSIS — D1801 Hemangioma of skin and subcutaneous tissue: Secondary | ICD-10-CM | POA: Diagnosis not present

## 2015-10-17 DIAGNOSIS — I129 Hypertensive chronic kidney disease with stage 1 through stage 4 chronic kidney disease, or unspecified chronic kidney disease: Secondary | ICD-10-CM | POA: Diagnosis not present

## 2015-10-17 DIAGNOSIS — E119 Type 2 diabetes mellitus without complications: Secondary | ICD-10-CM | POA: Diagnosis not present

## 2015-10-17 DIAGNOSIS — N183 Chronic kidney disease, stage 3 (moderate): Secondary | ICD-10-CM | POA: Diagnosis not present

## 2015-10-17 DIAGNOSIS — Z794 Long term (current) use of insulin: Secondary | ICD-10-CM | POA: Diagnosis not present

## 2015-10-17 DIAGNOSIS — E113599 Type 2 diabetes mellitus with proliferative diabetic retinopathy without macular edema, unspecified eye: Secondary | ICD-10-CM | POA: Diagnosis not present

## 2015-10-17 DIAGNOSIS — H9193 Unspecified hearing loss, bilateral: Secondary | ICD-10-CM | POA: Diagnosis not present

## 2015-10-17 DIAGNOSIS — J984 Other disorders of lung: Secondary | ICD-10-CM | POA: Diagnosis not present

## 2015-10-17 DIAGNOSIS — Z7901 Long term (current) use of anticoagulants: Secondary | ICD-10-CM | POA: Diagnosis not present

## 2015-10-17 DIAGNOSIS — E1122 Type 2 diabetes mellitus with diabetic chronic kidney disease: Secondary | ICD-10-CM | POA: Diagnosis not present

## 2015-10-30 ENCOUNTER — Observation Stay (HOSPITAL_COMMUNITY)
Admission: EM | Admit: 2015-10-30 | Discharge: 2015-11-05 | DRG: 197 | Disposition: A | Discharge status: Home / Self Care | Payer: BLUE CROSS/BLUE SHIELD | Attending: Family Medicine | Admitting: Family Medicine

## 2015-10-30 ENCOUNTER — Encounter (HOSPITAL_COMMUNITY): Payer: Self-pay | Admitting: *Deleted

## 2015-10-30 ENCOUNTER — Emergency Department (HOSPITAL_COMMUNITY): Payer: BLUE CROSS/BLUE SHIELD

## 2015-10-30 DIAGNOSIS — E1129 Type 2 diabetes mellitus with other diabetic kidney complication: Secondary | ICD-10-CM

## 2015-10-30 DIAGNOSIS — Z961 Presence of intraocular lens: Secondary | ICD-10-CM | POA: Diagnosis present

## 2015-10-30 DIAGNOSIS — Z7901 Long term (current) use of anticoagulants: Secondary | ICD-10-CM | POA: Diagnosis not present

## 2015-10-30 DIAGNOSIS — Z87891 Personal history of nicotine dependence: Secondary | ICD-10-CM

## 2015-10-30 DIAGNOSIS — N183 Chronic kidney disease, stage 3 unspecified: Secondary | ICD-10-CM | POA: Diagnosis present

## 2015-10-30 DIAGNOSIS — E785 Hyperlipidemia, unspecified: Secondary | ICD-10-CM | POA: Diagnosis present

## 2015-10-30 DIAGNOSIS — I5023 Acute on chronic systolic (congestive) heart failure: Secondary | ICD-10-CM | POA: Diagnosis not present

## 2015-10-30 DIAGNOSIS — Z9842 Cataract extraction status, left eye: Secondary | ICD-10-CM | POA: Diagnosis not present

## 2015-10-30 DIAGNOSIS — R059 Cough, unspecified: Secondary | ICD-10-CM | POA: Diagnosis present

## 2015-10-30 DIAGNOSIS — J8410 Pulmonary fibrosis, unspecified: Secondary | ICD-10-CM | POA: Diagnosis present

## 2015-10-30 DIAGNOSIS — J40 Bronchitis, not specified as acute or chronic: Secondary | ICD-10-CM | POA: Diagnosis present

## 2015-10-30 DIAGNOSIS — E669 Obesity, unspecified: Secondary | ICD-10-CM | POA: Diagnosis present

## 2015-10-30 DIAGNOSIS — Z8701 Personal history of pneumonia (recurrent): Secondary | ICD-10-CM

## 2015-10-30 DIAGNOSIS — Z794 Long term (current) use of insulin: Secondary | ICD-10-CM

## 2015-10-30 DIAGNOSIS — E11649 Type 2 diabetes mellitus with hypoglycemia without coma: Secondary | ICD-10-CM | POA: Diagnosis not present

## 2015-10-30 DIAGNOSIS — J849 Interstitial pulmonary disease, unspecified: Secondary | ICD-10-CM | POA: Diagnosis not present

## 2015-10-30 DIAGNOSIS — N179 Acute kidney failure, unspecified: Secondary | ICD-10-CM | POA: Diagnosis present

## 2015-10-30 DIAGNOSIS — M79674 Pain in right toe(s): Secondary | ICD-10-CM | POA: Diagnosis present

## 2015-10-30 DIAGNOSIS — J9809 Other diseases of bronchus, not elsewhere classified: Secondary | ICD-10-CM | POA: Diagnosis present

## 2015-10-30 DIAGNOSIS — J209 Acute bronchitis, unspecified: Secondary | ICD-10-CM | POA: Diagnosis present

## 2015-10-30 DIAGNOSIS — K228 Other specified diseases of esophagus: Secondary | ICD-10-CM | POA: Diagnosis present

## 2015-10-30 DIAGNOSIS — R05 Cough: Secondary | ICD-10-CM

## 2015-10-30 DIAGNOSIS — K219 Gastro-esophageal reflux disease without esophagitis: Secondary | ICD-10-CM | POA: Diagnosis present

## 2015-10-30 DIAGNOSIS — Z6830 Body mass index (BMI) 30.0-30.9, adult: Secondary | ICD-10-CM

## 2015-10-30 DIAGNOSIS — E1122 Type 2 diabetes mellitus with diabetic chronic kidney disease: Secondary | ICD-10-CM | POA: Diagnosis present

## 2015-10-30 DIAGNOSIS — I480 Paroxysmal atrial fibrillation: Secondary | ICD-10-CM | POA: Diagnosis present

## 2015-10-30 DIAGNOSIS — Z833 Family history of diabetes mellitus: Secondary | ICD-10-CM

## 2015-10-30 DIAGNOSIS — Z79899 Other long term (current) drug therapy: Secondary | ICD-10-CM

## 2015-10-30 DIAGNOSIS — I130 Hypertensive heart and chronic kidney disease with heart failure and stage 1 through stage 4 chronic kidney disease, or unspecified chronic kidney disease: Secondary | ICD-10-CM | POA: Diagnosis present

## 2015-10-30 DIAGNOSIS — I5032 Chronic diastolic (congestive) heart failure: Secondary | ICD-10-CM | POA: Diagnosis present

## 2015-10-30 DIAGNOSIS — I10 Essential (primary) hypertension: Secondary | ICD-10-CM | POA: Diagnosis present

## 2015-10-30 DIAGNOSIS — Z888 Allergy status to other drugs, medicaments and biological substances status: Secondary | ICD-10-CM

## 2015-10-30 DIAGNOSIS — Z9841 Cataract extraction status, right eye: Secondary | ICD-10-CM

## 2015-10-30 HISTORY — DX: Interstitial pulmonary disease, unspecified: J84.9

## 2015-10-30 LAB — I-STAT TROPONIN, ED: Troponin i, poc: 0.01 ng/mL (ref 0.00–0.08)

## 2015-10-30 LAB — BASIC METABOLIC PANEL
Anion gap: 10 (ref 5–15)
BUN: 37 mg/dL — AB (ref 6–20)
CHLORIDE: 100 mmol/L — AB (ref 101–111)
CO2: 27 mmol/L (ref 22–32)
Calcium: 9.4 mg/dL (ref 8.9–10.3)
Creatinine, Ser: 2.37 mg/dL — ABNORMAL HIGH (ref 0.61–1.24)
GFR calc Af Amer: 28 mL/min — ABNORMAL LOW (ref >60)
GFR calc non Af Amer: 24 mL/min — ABNORMAL LOW (ref >60)
Glucose, Bld: 164 mg/dL — ABNORMAL HIGH (ref 65–99)
POTASSIUM: 5 mmol/L (ref 3.5–5.1)
SODIUM: 137 mmol/L (ref 135–145)

## 2015-10-30 LAB — LACTIC ACID, PLASMA: LACTIC ACID, VENOUS: 1.3 mmol/L (ref 0.5–2.0)

## 2015-10-30 LAB — CBC
HEMATOCRIT: 42.6 % (ref 39.0–52.0)
Hemoglobin: 13.5 g/dL (ref 13.0–17.0)
MCH: 29.4 pg (ref 26.0–34.0)
MCHC: 31.7 g/dL (ref 30.0–36.0)
MCV: 92.8 fL (ref 78.0–100.0)
Platelets: 210 10*3/uL (ref 150–400)
RBC: 4.59 MIL/uL (ref 4.22–5.81)
RDW: 15.2 % (ref 11.5–15.5)
WBC: 17.5 10*3/uL — AB (ref 4.0–10.5)

## 2015-10-30 LAB — CBG MONITORING, ED
GLUCOSE-CAPILLARY: 168 mg/dL — AB (ref 65–99)
Glucose-Capillary: 215 mg/dL — ABNORMAL HIGH (ref 65–99)
Glucose-Capillary: 258 mg/dL — ABNORMAL HIGH (ref 65–99)

## 2015-10-30 LAB — PROTIME-INR
INR: 2.16 — AB (ref 0.00–1.49)
PROTHROMBIN TIME: 23.9 s — AB (ref 11.6–15.2)

## 2015-10-30 LAB — BRAIN NATRIURETIC PEPTIDE: B NATRIURETIC PEPTIDE 5: 130.9 pg/mL — AB (ref 0.0–100.0)

## 2015-10-30 MED ORDER — FUROSEMIDE 20 MG PO TABS: 40.0000 mg | ORAL_TABLET | Freq: Every day | ORAL | Status: DC

## 2015-10-30 MED ORDER — DEXTROSE 5 % IV SOLN
1.0000 g | INTRAVENOUS | Status: AC
  Administered 2015-10-30 – 2015-11-03 (×5): 1 g via INTRAVENOUS
  Filled 2015-10-30 (×5): qty 10

## 2015-10-30 MED ORDER — INSULIN DETEMIR 100 UNIT/ML ~~LOC~~ SOLN: 25.0000 [IU] | Freq: Two times a day (BID) | SUBCUTANEOUS | Status: DC

## 2015-10-30 MED ORDER — PANTOPRAZOLE SODIUM 40 MG PO TBEC
40.0000 mg | DELAYED_RELEASE_TABLET | Freq: Every day | ORAL | Status: DC
Start: 2015-10-31 — End: 2015-11-05
  Administered 2015-10-31 – 2015-11-05 (×6): 40 mg via ORAL
  Filled 2015-10-30 (×6): qty 1

## 2015-10-30 MED ORDER — METOPROLOL TARTRATE 25 MG PO TABS
50.0000 mg | ORAL_TABLET | Freq: Two times a day (BID) | ORAL | Status: DC
  Administered 2015-10-31 – 2015-11-04 (×10): 50 mg via ORAL
  Filled 2015-10-30 (×12): qty 2

## 2015-10-30 MED ORDER — ATORVASTATIN CALCIUM 40 MG PO TABS
40.0000 mg | ORAL_TABLET | Freq: Every day | ORAL | Status: DC
  Administered 2015-10-31 – 2015-11-05 (×6): 40 mg via ORAL
  Filled 2015-10-30 (×6): qty 1

## 2015-10-30 MED ORDER — SODIUM CHLORIDE 0.9% FLUSH
3.0000 mL | Freq: Two times a day (BID) | INTRAVENOUS | Status: DC
  Administered 2015-10-31 – 2015-11-05 (×12): 3 mL via INTRAVENOUS

## 2015-10-30 MED ORDER — SODIUM CHLORIDE 0.9 % IV SOLN: 250.0000 mL | INTRAVENOUS | Status: DC | PRN

## 2015-10-30 MED ORDER — DEXTROSE 5 % IV SOLN
500.0000 mg | INTRAVENOUS | Status: AC
  Administered 2015-10-30 – 2015-11-03 (×5): 500 mg via INTRAVENOUS
  Filled 2015-10-30 (×5): qty 500

## 2015-10-30 MED ORDER — DEXTROSE 5 % IV SOLN: 1.0000 g | Freq: Once | INTRAVENOUS | Status: DC

## 2015-10-30 MED ORDER — ALBUTEROL (5 MG/ML) CONTINUOUS INHALATION SOLN
20.0000 mg/h | INHALATION_SOLUTION | Freq: Once | RESPIRATORY_TRACT | Status: AC
  Administered 2015-10-30: 20 mg/h via RESPIRATORY_TRACT
  Filled 2015-10-30: qty 20

## 2015-10-30 MED ORDER — DEXTROSE 5 % IV SOLN: 500.0000 mg | Freq: Once | INTRAVENOUS | Status: DC

## 2015-10-30 MED ORDER — INSULIN ASPART 100 UNIT/ML ~~LOC~~ SOLN
0.0000 [IU] | Freq: Every day | SUBCUTANEOUS | Status: DC
  Administered 2015-10-30: 3 [IU] via SUBCUTANEOUS
  Administered 2015-11-02: 4 [IU] via SUBCUTANEOUS
  Filled 2015-10-30: qty 1

## 2015-10-30 MED ORDER — PANTOPRAZOLE SODIUM 40 MG PO TBEC: 40.0000 mg | DELAYED_RELEASE_TABLET | Freq: Every day | ORAL | Status: DC

## 2015-10-30 MED ORDER — INSULIN ASPART 100 UNIT/ML ~~LOC~~ SOLN
0.0000 [IU] | Freq: Three times a day (TID) | SUBCUTANEOUS | Status: DC
  Administered 2015-10-31: 3 [IU] via SUBCUTANEOUS
  Administered 2015-10-31: 2 [IU] via SUBCUTANEOUS
  Administered 2015-11-01 – 2015-11-02 (×2): 5 [IU] via SUBCUTANEOUS
  Administered 2015-11-02 – 2015-11-03 (×3): 3 [IU] via SUBCUTANEOUS
  Administered 2015-11-03: 5 [IU] via SUBCUTANEOUS
  Administered 2015-11-04: 8 [IU] via SUBCUTANEOUS
  Administered 2015-11-04: 3 [IU] via SUBCUTANEOUS
  Administered 2015-11-05: 2 [IU] via SUBCUTANEOUS
  Administered 2015-11-05: 5 [IU] via SUBCUTANEOUS
  Administered 2015-11-05: 3 [IU] via SUBCUTANEOUS

## 2015-10-30 MED ORDER — SODIUM CHLORIDE 0.9% FLUSH: 3.0000 mL | INTRAVENOUS | Status: DC | PRN

## 2015-10-30 NOTE — H&P (Signed)
Justin Leach [id number] DOB: 1931-11-18 DOA: 10/30/2015     PCP: Irven Shelling, MD   Outpatient Specialists: Cardiology Hochrein,   Patient coming from:  home Lives With family    Chief Complaint: Dyspnea  HPI: Justin Leach is a 82 y.o. male with medical history significant of atrial fibrillation on anticoagulation, PFTs with severe restriction, diastolic dysfunction chronic kidney disease, pulmonary fibrosis, DM2    Presented with patient has been having progressive dyspnea for the past 4-5 years has been gradually progressive. Patient has known history of reactive disease and an followed by pulmonology This was thought to be secondary to amiodarone which she used to be taking for atrial fibrillation and it was stopped. He has been having worsening cough productive of greenish sputum increased fatigue. He has had cough for the past 4-5 months. But the shortness of breath got so severe he came to see his PCP At his baseline he sleeps in a chair. Denies PND he reports that his weight actually has come down by 10 pounds lately She was seen by his PCP today Dr. Laurann Montana did increase shortness of breath and abnormal lung exam.  He was sent to emergency department   Regarding pertinent Chronic problems: And has history of diastolic heart failure History of true fibrillation currently on Coumadin and amiodarone had to be stopped due to possibly pulmonary fibrosis  IN ER: Afebrile, heart rate of 64 put pressure 161/47 oxygen saturation 91% on room air Increased work of breathing WBC is 17.5 hemoglobin 13.5 creatinine 2.37 INR 2.16 Lactic acid 1.3 BNP 130 which is below baseline  Chest x-ray nonacute    Hospitalist was called for admission for bronchitis with increased work of breathing  Review of Systems:    Pertinent positives include:  fatigue, shortness of breath at rest. dyspnea on exertion,  Constitutional:  No weight loss, night sweats, Fevers, chills,  weight loss  HEENT:  No headaches, Difficulty swallowing,Tooth/dental problems,Sore throat,  No sneezing, itching, ear ache, nasal congestion, post nasal drip,  Cardio-vascular:  No chest pain, Orthopnea, PND, anasarca, dizziness, palpitations.no Bilateral lower extremity swelling  GI:  No heartburn, indigestion, abdominal pain, nausea, vomiting, diarrhea, change in bowel habits, loss of appetite, melena, blood in stool, hematemesis Resp:   No excess mucus, no productive cough, No non-productive cough, No coughing up of blood.No change in color of mucus.No wheezing. Skin:  no rash or lesions. No jaundice GU:  no dysuria, change in color of urine, no urgency or frequency. No straining to urinate.  No flank pain.  Musculoskeletal:  No joint pain or no joint swelling. No decreased range of motion. No back pain.  Psych:  No change in mood or affect. No depression or anxiety. No memory loss.  Neuro: no localizing neurological complaints, no tingling, no weakness, no double vision, no gait abnormality, no slurred speech, no confusion  As per HPI otherwise 10 point review of systems negative.   Past Medical History: Past Medical History  Diagnosis Date  . Essential hypertension   . Type II diabetes mellitus ([place])   . Pneumonia 2007  . Cervical disc disease   . Dyslipidemia   . CKD (chronic kidney disease), stage III   . GERD (gastroesophageal reflux disease)   . PAF (paroxysmal atrial fibrillation) ([place])     a. Dx 02/2015 - in and out on tele, rx'd Coumadin and amiodarone.  . Chronic diastolic CHF (congestive heart failure) ([place])     a. Dx 02/2015 -  2D echo 03/02/15 showed severe focal basal hypertrophy of the septum, EF 55-60%, mild MR, no effusion.  . Coronary artery calcification seen on CT scan     a. Nuc 02/2015 was normal.  . Bradycardia     a. during 02/2015 admission - occasional HR in 40s while being treated for atrial fib.   Past Surgical History  Procedure Laterality  Date  . Eye surgery  1930's    "don't know what for" (05/11/2013)  . Cholecystectomy    . Cervical disc surgery      "i've had 3 neck ORs; not sure what kind; all thru the back of my neck"  . Posterior fusion cervical spine    . Knee arthroscopy      "had one scope; one open knee OR; not sure which on which side" (05/10/2013)  . Knee arthroplasty      "had one scope; one open knee OR; not sure which on which side" (05/10/2013)  . Rotator cuff repair Bilateral   . Joint replacement      bilateral knees, elbows, and shoulders (on 05/11/2013 pt denies all joint replacements"   . Cataract extraction w/ intraocular lens  implant, bilateral Bilateral      Social History:  Ambulatory   Independently     reports that he has never smoked. He has quit using smokeless tobacco. His smokeless tobacco use included Chew. He reports that he does not drink alcohol or use illicit drugs.  Allergies:   Allergies  Allergen Reactions  . Nifedipine Other (See Comments)    Reaction: made gums swell up. Pt states that he had to have gum surgery after taking.        Family History:    Family History  Problem Relation Age of Onset  . Heart disease Neg Hx     He does not know his father's history.   . Cancer Neg Hx   . Diabetes Neg Hx   . Diabetes Mellitus II Paternal Grandmother     Medications: Prior to Admission medications   Medication Sig Start Date End Date Taking? Authorizing Provider  atorvastatin (LIPITOR) 80 MG tablet Take 40 mg by mouth daily. 12/27/13   Historical Provider, MD  Cholecalciferol (VITAMIN D-3) 1000 UNITS CAPS Take 1 capsule by mouth daily.    Historical Provider, MD  furosemide (LASIX) 40 MG tablet Take 40 mg by mouth daily.    Historical Provider, MD  insulin detemir (LEVEMIR) 100 UNIT/ML injection Inject 0.1 mLs (10 Units total) into the skin at bedtime. Patient taking differently: Inject 25-35 Units into the skin See admin instructions. 35 units in the morning and  25 units at night 03/07/15   Nita Sells, MD  insulin lispro (HUMALOG) 100 UNIT/ML injection Inject 10-30 Units into the skin 3 (three) times daily before meals.    Historical Provider, MD  lisinopril (PRINIVIL,ZESTRIL) 10 MG tablet Take 1 tablet (10 mg total) by mouth daily. 07/17/15   Praveen Mannam, MD  metoprolol (LOPRESSOR) 50 MG tablet Take 50 mg by mouth 2 (two) times daily.    Historical Provider, MD  omeprazole (PRILOSEC) 20 MG capsule Take 20 mg by mouth daily.  02/26/13   Historical Provider, MD  vitamin B-12 (CYANOCOBALAMIN) 1000 MCG tablet Take 1,000 mcg by mouth daily.    Historical Provider, MD  warfarin (COUMADIN) 2.5 MG tablet Take 1 tablet (2.5 mg total) by mouth one time only at 6 PM. 03/07/15   Nita Sells, MD    Physical Exam:  Patient Vitals for the past 24 hrs:  BP Temp Temp src Pulse Resp SpO2 Height Weight  10/30/15 2153 - - - - - 94 % - -  10/30/15 2149 (!) 161/45 mmHg - - 64 16 91 % - -  10/30/15 1534 - - - - - - 5\' 6"  (1.676 m) 85.276 kg (188 lb)  10/30/15 1454 (!) 146/52 mmHg 97.7 F (36.5 C) Oral 62 18 98 % 5\' 5"  (1.651 m) 85.276 kg (188 lb)    1. General:  in No Acute distress 2. Psychological: Alert and  Oriented 3. Head/ENT:    Dry Mucous Membranes                          Head Non traumatic, neck supple                            Poor Dentition 4. SKIN:   decreased Skin turgor,  Skin clean Dry and intact no rash 5. Heart: Regular rate and rhythm no  Murmur, Rub or gallop 6. Lungs: no wheezes some fine crackles   7. Abdomen: Soft, non-tender, Non distended 8. Lower extremities: no clubbing, cyanosis, trace edema 9. Neurologically Grossly intact, moving all 4 extremities equally 10. MSK: Normal range of motion   body mass index is 30.36 kg/(m^2).  Labs on Admission:   Labs on Admission: I have personally reviewed following labs and imaging studies  CBC:  Recent Labs Lab 10/30/15 1739  WBC 17.5*  HGB 13.5  HCT 42.6  MCV 92.8    PLT [id number]   Basic Metabolic Panel:  Recent Labs Lab 10/30/15 1739  NA 137  K 5.0  CL 100*  CO2 27  GLUCOSE 164*  BUN 37*  CREATININE 2.37*  CALCIUM 9.4   GFR: Estimated Creatinine Clearance: 25 mL/min (by C-G formula based on Cr of 2.37). Liver Function Tests: No results for input(s): AST, ALT, ALKPHOS, BILITOT, PROT, ALBUMIN in the last 168 hours. No results for input(s): LIPASE, AMYLASE in the last 168 hours. No results for input(s): AMMONIA in the last 168 hours. Coagulation Profile:  Recent Labs Lab 10/30/15 1956  INR 2.16*   Cardiac Enzymes: No results for input(s): CKTOTAL, CKMB, CKMBINDEX, TROPONINI in the last 168 hours. BNP (last 3 results) No results for input(s): PROBNP in the last 8760 hours. HbA1C: No results for input(s): HGBA1C in the last 72 hours. CBG:  Recent Labs Lab 10/30/15 1612  GLUCAP 168*   Lipid Profile: No results for input(s): CHOL, HDL, LDLCALC, TRIG, CHOLHDL, LDLDIRECT in the last 72 hours. Thyroid Function Tests: No results for input(s): TSH, T4TOTAL, FREET4, T3FREE, THYROIDAB in the last 72 hours. Anemia Panel: No results for input(s): VITAMINB12, FOLATE, FERRITIN, TIBC, IRON, RETICCTPCT in the last 72 hours. Urine analysis:    Component Value Date/Time   COLORURINE YELLOW 05/11/2013 1632   APPEARANCEUR CLEAR 05/11/2013 1632   LABSPEC 1.018 05/11/2013 1632   PHURINE 5.0 05/11/2013 1632   GLUCOSEU NEGATIVE 05/11/2013 1632   HGBUR NEGATIVE 05/11/2013 1632   BILIRUBINUR NEGATIVE 05/11/2013 1632   KETONESUR NEGATIVE 05/11/2013 1632   PROTEINUR NEGATIVE 05/11/2013 1632   UROBILINOGEN 0.2 05/11/2013 1632   NITRITE NEGATIVE 05/11/2013 1632   LEUKOCYTESUR NEGATIVE 05/11/2013 1632   Sepsis Labs: @LABRCNTIP (procalcitonin:4,lacticidven:4) )No results found for this or any previous visit (from the past 240 hour(s)).     UA ordered  No results found for: HGBA1C  Estimated Creatinine Clearance: 25 mL/min (by C-G formula based on  Cr of 2.37).  BNP (last 3 results) No results for input(s): PROBNP in the last 8760 hours.   ECG REPORT  Independently reviewed Rate: 61  Rhythm: Sinus rhythm and first-degree AV block ST&T Change: No acute ischemic changes   QTC 444  Filed Weights   10/30/15 1454 10/30/15 1534  Weight: 85.276 kg (188 lb) 85.276 kg (188 lb)     Cultures:    Component Value Date/Time   SDES STOOL 05/12/2013 1544   SPECREQUEST NONE 05/12/2013 1544   CULT  05/12/2013 1544    NO SALMONELLA, SHIGELLA, CAMPYLOBACTER, YERSINIA, OR E.COLI 0157:H7 ISOLATED Performed at [facility] 05/16/2013 FINAL 05/12/2013 1544     Radiological Exams on Admission: Dg Chest 2 View  10/30/2015  CLINICAL DATA:  Shortness of breath for 2 months EXAM: CHEST  2 VIEW COMPARISON:  06/12/2015 FINDINGS: Cardiac shadow is within normal limits. Lungs are clear bilaterally. Chronic changes are noted in the thoracic spine. No acute abnormality seen. IMPRESSION: No acute abnormality noted. Electronically Signed   By: Inez Catalina M.D.   On: 10/30/2015 16:01    Chart has been reviewed    Assessment/Plan  82 y.o. male with medical history significant of atrial fibrillation on anticoagulation, PFTs with severe restriction, diastolic dysfunction chronic kidney disease, pulmonary fibrosis . DM2 Increased work of breathing and bronchitis  Present on Admission:  . Bronchitis - Likely worsening in the setting of chronic pulmonary fibrosis. Discussed with pulmonology agreed with   antibiotics, obtain high-resolution CT scan of the lungs , flutter valve hold off on steroids . Chronic diastolic CHF (congestive heart failure) ([place]) - unsure if dyspnea is being contributed by heart failure. Currently does not appear to be fluid overloaded we'll continue home dose of Lasix obtain echogram to evaluate father  . CKD (chronic kidney disease), stage III currently at baseline continue to monitor hold lisinopril   .  Essential hypertension continue home medications and hold lisinopril  . PAF (paroxysmal atrial fibrillation) ([facility]) - continue Coumadin and metoprolol chronic bursa be rate controlled monitor on telemetry   diabetes mellitus - sliding scale continue home regimen of insulin Leukocytosis possibly underlying infection will obtain UA and treat for possible bronchitis Other plan as per orders.  DVT prophylaxis:  coumadin  Code Status:  FULL CODE  as per patient    Family Communication:   Family not  at  Bedside   Disposition Plan:   To home once workup is complete and patient is stable   Consults called: Pulmonology  Admission status:   obs    Level of care    tele          [place] 10/30/2015, 10:58 PM    [facility]  Pager [phone]   after 2 AM please page floor coverage PA If 7AM-7PM, please contact the day team taking care of the patient  [email or web address]  Password [email or web address]

## 2015-10-30 NOTE — Progress Notes (Signed)
ANTICOAGULATION CONSULT NOTE - Initial Consult  Pharmacy Consult for Coumadin Indication: atrial fibrillation  Allergies  Allergen Reactions  . Nifedipine Other (See Comments)    Reaction: made gums swell up. Pt states that he had to have gum surgery after taking.     Patient Measurements: Height: 5\' 6"  (167.6 cm) Weight: 188 lb (85.276 kg) IBW/kg (Calculated) : 63.8  Vital Signs: Temp: 97.7 F (36.5 C) (06/12 1454) Temp Source: Oral (06/12 1454) BP: 137/41 mmHg (06/12 2321) Pulse Rate: 88 (06/12 2321)  Labs:  Recent Labs  10/30/15 1739 10/30/15 1956  HGB 13.5  --   HCT 42.6  --   PLT 210  --   LABPROT  --  23.9*  INR  --  2.16*  CREATININE 2.37*  --     Estimated Creatinine Clearance: 25 mL/min (by C-G formula based on Cr of 2.37).   Medical History: Past Medical History  Diagnosis Date  . Essential hypertension   . Type II diabetes mellitus ([place])   . Pneumonia 2007  . Cervical disc disease   . Dyslipidemia   . CKD (chronic kidney disease), stage III   . GERD (gastroesophageal reflux disease)   . PAF (paroxysmal atrial fibrillation) ([place])     a. Dx 02/2015 - in and out on tele, rx'd Coumadin and amiodarone.  . Chronic diastolic CHF (congestive heart failure) ([place])     a. Dx 02/2015 -  2D echo 03/02/15 showed severe focal basal hypertrophy of the septum, EF 55-60%, mild MR, no effusion.  . Coronary artery calcification seen on CT scan     a. Nuc 02/2015 was normal.  . Bradycardia     a. during 02/2015 admission - occasional HR in 40s while being treated for atrial fib.  . 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   Current Outpatient Prescriptions on File Prior to Encounter  Medication Sig Dispense Refill  . atorvastatin (LIPITOR) 80 MG tablet Take 40 mg by mouth daily.  2  . Cholecalciferol (VITAMIN D-3) 1000 UNITS CAPS  Take 1 capsule by mouth daily.    . furosemide (LASIX) 40 MG tablet Take 40 mg by mouth daily.    . insulin detemir (LEVEMIR) 100 UNIT/ML injection Inject 0.1 mLs (10 Units total) into the skin at bedtime. (Patient taking differently: Inject 25-35 Units into the skin See admin instructions. 35 units in the morning and 25 units at night) 10 mL 11  . insulin lispro (HUMALOG) 100 UNIT/ML injection Inject 10-30 Units into the skin 3 (three) times daily before meals.    Marland Kitchen lisinopril (PRINIVIL,ZESTRIL) 10 MG tablet Take 1 tablet (10 mg total) by mouth daily.    . metoprolol (LOPRESSOR) 50 MG tablet Take 50 mg by mouth 2 (two) times daily.    Marland Kitchen omeprazole (PRILOSEC) 20 MG capsule Take 20 mg by mouth daily.     . vitamin B-12 (CYANOCOBALAMIN) 1000 MCG tablet Take 1,000 mcg by mouth daily.    Marland Kitchen warfarin (COUMADIN) 2.5 MG tablet Take 1 tablet (2.5 mg total) by mouth one time only at 6 PM. 7 tablet 0   Assessment: 82 y.o. male admitted with SOB/acute bronchitis, h/o Afib, to continue Coumadin  Goal of Therapy:  INR 2-3 Monitor platelets by anticoagulation protocol: Yes   Plan:  F/U current Coumadin regiman Daily INR  Kasch Borquez, Bronson Curb 10/30/2015,11:57 PM

## 2015-10-30 NOTE — ED Notes (Signed)
Sandwich and diet ginger ale given.

## 2015-10-30 NOTE — ED Notes (Signed)
Pt began coughing, stating " I can't breathe." The majority of neb treatment spilled out of treatment. Will notify RT

## 2015-10-30 NOTE — ED Notes (Signed)
Pt sent here from Dr Delene Ruffini office for CHF exacerbation.

## 2015-10-30 NOTE — Consult Note (Addendum)
Name: Justin Leach MRN: [id number] DOB: [birth year]    ADMISSION DATE:  10/30/2015 CONSULTATION DATE:  10/30/2015  REFERRING MD :  Toy Baker, M.D. / [facility]  CHIEF COMPLAINT:  Acute Bronchitis w/ Possible ILD Flare  BRIEF PATIENT DESCRIPTION: 82 year old male followed by Dr. Vaughan Browner. Admitted October 2016 with new onset atrial fibrillation, diastolic congestive heart failure, & acute on chronic renal failure. Placed on amiodarone at that time. Chest imaging in January showed new findings concerning for amiodarone toxicity versus NSIP. Patient subsequently taken off amiodarone.   SIGNIFICANT EVENTS  6/12 - Admit  STUDIES:  TTE (03/02/15): LV normal in size with severe focal basal septal hypertrophy. EF 55-60%. Normal wall motion. Unable to assess diastolic dysfunction due to atrial fibrillation. LA & RA normal in size. RV normal in size and function. No aortic stenosis or regurgitation. Normal aortic root size. Trivial mitral regurgitation without stenosis. No pulmonic stenosis. Mild tricuspid regurgitation. Pulmonary artery normal in size. No pericardial effusion.  PFT (04/26/15): FVC 1.59 L (82%) FEV1 1.25 L (89%) FEV1/FVC 0.79 FEF 25-75 1.28 L (93%) negative bronchodilator response TLC 2.69 L (45%) RV 55% ERV 38% DLCO uncorrected 53%  HRCT CHEST W/O 06/12/15 (personally reviewed by me): No pleural effusion or thickening. No pathologic mediastinal adenopathy or mediastinal mass. No pericardial effusion. 7 mm nodule in right lower lobe noted. No traction bronchiectasis or honeycomb changes. Predominantly dependent subpleural reticular changes and small amount of groundglass opacification. This appears more prevalent within the lower lung zones.  CXR PA/LAT 10/30/15 (personally reviewed by me): Heart borderline normal in size. Mediastinum normal in contour. Slightly low lung volumes with kyphosis noted on lateral view. No parenchymal opacity or mass appreciated. No pleural effusion or  thickening appreciated.  LABS 07/14/15 ANA: Negative Centromere Ab Screen:  <1.0 Anti-CCP:  >250 RF:  <10 Smith Ab:  <1.0 DS DNA Ab:  <1 Jo-1 Ab:  <1.0 SSA:  <1.0 SSB:  <1.0 SCL-70:  <1.0 RNP:  <1.0  HISTORY OF PRESENT ILLNESS:  As per the patient's electronic medical record she reported progressive dyspnea for the past 4-5 years. The patient reported to the admitting physician that she had a cough for the past 4-5 months with increasing mucus production that was qualified as "greenish". Patient also endorsed increased fatigue. Patient had also reported increasing dyspnea. Patient was subsequently sent to the emergency department by his primary care physician today due to his increasing dyspnea and reportedly abnormal pulmonary exam. Patient was subsequently admitted by the Hospitalist Service and placed on Azithromycin & Rocephin for empiric antibiotic coverage. Patient confirms that he "can't get breath" and this seems to be progressively worsening over the last 3-4 months. He reports he does fatigue easily due to dyspnea with exertion. He also endorses ongoing wheezing. He reports progressively worsening cough productive of a "yellow, brown" phlegm. He denies any subjective fever, chills, or sweats. He denies any rashes or abnormal bruising. He does report some mild swelling and pain in his right great toe but denies any other joint swelling, erythema, or stiffness. He denies any reflux or morning brash water taste. He denies any odynophagia but does admit to occasional dysphagia.  PAST MEDICAL HISTORY :  Past Medical History  Diagnosis Date  . Essential hypertension   . Type II diabetes mellitus ([place])   . Pneumonia 2007  . Cervical disc disease   . Dyslipidemia   . CKD (chronic kidney disease), stage III   . GERD (gastroesophageal reflux disease)   .  PAF (paroxysmal atrial fibrillation) ([place])     a. Dx 02/2015 - in and out on tele, rx'd Coumadin and amiodarone.  . Chronic diastolic  CHF (congestive heart failure) ([place])     a. Dx 02/2015 -  2D echo 03/02/15 showed severe focal basal hypertrophy of the septum, EF 55-60%, mild MR, no effusion.  . Coronary artery calcification seen on CT scan     a. Nuc 02/2015 was normal.  . Bradycardia     a. during 02/2015 admission - occasional HR in 40s while being treated for atrial fib.  . 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 Eye surgery  1930's    "don't know what for" (05/11/2013)  . Cholecystectomy    . Cervical disc surgery      "i've had 3 neck ORs; not sure what kind; all thru the back of my neck"  . Posterior fusion cervical spine    . Knee arthroscopy      "had one scope; one open knee OR; not sure which on which side" (05/10/2013)  . Knee arthroplasty      "had one scope; one open knee OR; not sure which on which side" (05/10/2013)  . Rotator cuff repair Bilateral   . Joint replacement      bilateral knees, elbows, and shoulders (on 05/11/2013 pt denies all joint replacements"   . Cataract extraction w/ intraocular lens  implant, bilateral Bilateral     Prior to Admission medications   Medication Sig Start Date End Date Taking? Authorizing Provider  atorvastatin (LIPITOR) 80 MG tablet Take 40 mg by mouth daily. 12/27/13   Historical Provider, MD  Cholecalciferol (VITAMIN D-3) 1000 UNITS CAPS Take 1 capsule by mouth daily.    Historical Provider, MD  furosemide (LASIX) 40 MG tablet Take 40 mg by mouth daily.    Historical Provider, MD  insulin detemir (LEVEMIR) 100 UNIT/ML injection Inject 0.1 mLs (10 Units total) into the skin at bedtime. Patient taking differently: Inject 25-35 Units into the skin See admin instructions. 35 units in the morning and 25 units at night 03/07/15   Nita Sells, MD  insulin lispro (HUMALOG) 100 UNIT/ML injection Inject 10-30 Units into the skin 3 (three)  times daily before meals.    Historical Provider, MD  lisinopril (PRINIVIL,ZESTRIL) 10 MG tablet Take 1 tablet (10 mg total) by mouth daily. 07/17/15   Praveen Mannam, MD  metoprolol (LOPRESSOR) 50 MG tablet Take 50 mg by mouth 2 (two) times daily.    Historical Provider, MD  omeprazole (PRILOSEC) 20 MG capsule Take 20 mg by mouth daily.  02/26/13   Historical Provider, MD  vitamin B-12 (CYANOCOBALAMIN) 1000 MCG tablet Take 1,000 mcg by mouth daily.    Historical Provider, MD  warfarin (COUMADIN) 2.5 MG tablet Take 1 tablet (2.5 mg total) by mouth one time only at 6 PM. 03/07/15   Nita Sells, MD   Allergies  Allergen Reactions  . Nifedipine Other (See Comments)    Reaction: made gums swell up. Pt states that he had to have gum surgery after taking.     FAMILY HISTORY:  Family History  Problem Relation Age of Onset  . Heart disease Neg Hx     He does not know his father's history.   . Cancer Neg Hx   . Diabetes Neg Hx   . CAD Neg Hx   .  Diabetes Mellitus II Paternal Grandmother   . Lung disease Neg Hx   . Rheumatologic disease Neg Hx    SOCIAL HISTORY: Social History   Social History  . Marital Status: Married    Spouse Name: Vaughan Basta  . Number of Children: 0  . Years of Education: N/A   Occupational History  . Retired from Architect work.    Social History Main Topics  . Smoking status: Never Smoker   . Smokeless tobacco: Former Systems developer    Types: Chew  . Alcohol Use: No  . Drug Use: No     Comment: used chew when I was a teenager per patient   . Sexual Activity: Not Asked   Other Topics Concern  . None   Social History Narrative   Married.  Lives with wife.  Ambulates independently.  Has a dog names is named Programmer, applications  .   Retired - worked for [employer] paper   No Sport and exercise psychologist Pulmonary:   No known bird, mold, or hot tub exposure.    REVIEW OF SYSTEMS:  No dry eyes, dry mouth, or oral ulcers. No dysuria or hematuria. A pertinent 14  point review of systems is negative except as per the history of presenting illness.  SUBJECTIVE: As above.  VITAL SIGNS: Temp:  [97.7 F (36.5 C)] 97.7 F (36.5 C) (06/12 1454) Pulse Rate:  [62-88] 88 (06/12 2321) Resp:  [16-19] 19 (06/12 2321) BP: (137-161)/(41-52) 137/41 mmHg (06/12 2321) SpO2:  [85 %-98 %] 85 % (06/12 2321) Weight:  [188 lb (85.276 kg)] 188 lb (85.276 kg) (06/12 1534)  PHYSICAL EXAMINATION: General:  Awake. Alert. No acute distress. Mild central obesity.  Integument:  Warm & dry. No rash on exposed skin. No bruising. Mild erythema around the nail bed of the right great toe. Lymphatics:  No appreciated cervical or supraclavicular lymphadenoapthy. HEENT:  Moist mucus membranes. No oral ulcers. No scleral icterus. Mild scleral injection. Cardiovascular:  Regular rate. No edema. No appreciable JVD given body habitus.  Pulmonary:  Normal work of breathing on room air. Speaking in complete sentences. Mild coarse wheeze in the apices with basilar predominant crackles. Abdomen: Soft. Normal bowel sounds. Protuberant. Grossly nontender. Musculoskeletal:  Normal bulk and tone. Hand grip strength 5/5 bilaterally. No joint deformity or effusion appreciated. Neurological:  CN 2-12 grossly in tact. No meningismus. Moving all 4 extremities equally.  Psychiatric:  Mood and affect congruent. Speech normal rhythm, rate & tone.    Recent Labs Lab 10/30/15 1739  NA 137  K 5.0  CL 100*  CO2 27  BUN 37*  CREATININE 2.37*  GLUCOSE 164*    Recent Labs Lab 10/30/15 1739  HGB 13.5  HCT 42.6  WBC 17.5*  PLT 210   Dg Chest 2 View  10/30/2015  CLINICAL DATA:  Shortness of breath for 2 months EXAM: CHEST  2 VIEW COMPARISON:  06/12/2015 FINDINGS: Cardiac shadow is within normal limits. Lungs are clear bilaterally. Chronic changes are noted in the thoracic spine. No acute abnormality seen. IMPRESSION: No acute abnormality noted. Electronically Signed   By: Inez Catalina M.D.   On:  10/30/2015 16:01    ASSESSMENT / PLAN:  82 year old male with history of interstitial lung disease either representing early NSIP or amiodarone-induced lung injury. Reviewing the patient's previous autoimmune workup shows a significantly elevated anti-CCP but negative ANA and rheumatoid factor. The patient has no physical exam findings or joint complaints that would fit with an autoimmune  process. However, his ongoing and worsening dyspnea, cough, and wheezing are perplexing. He has no signs of volume overload that would suggest decompensated diastolic congestive heart failure and his serum BNP while elevated is only mildly so and significantly less than his previous hospitalization with decompensated diastolic congestive heart failure. With his elevated white blood cell count I am certainly suspicious about an infectious process contributing to his cough. Reviewing the patient's prior pulmonary function testing from December shows no evidence for airway dysfunction to suggest either asthma or COPD. Further imaging, autoimmune serologies, and infectious workup is necessary to better characterize this patient's chronic and seemingly worsening respiratory status.  1. Cough/Acute Bronchitis: Agree with empiric Rocephin & azithromycin for now. Awaiting urine streptococcal and legionella antigens ordered by primary service. I have also ordered serum Procalcitonin per non-ICU algorithm and sputum cultures for both AFB and fungus. No TB isolation is necessary. 2. ILD: Repeating serum ESR, CRP, ANA, rheumatoid factor, and anti-CCP. Also ordering repeat HRCT of the chest without contrast to better evaluate underlying parenchyma. Holding on initiating immunosuppression/steroids at this time. 3. GERD w/ Dysphagia: Continuing patient on Protonix) of Prilosec. Ordering barium esophagram for formal evaluation.  We will continue to follow along.  Sonia Baller Ashok Cordia, M.D. [facility] Pager:   [phone] After 3pm or if no response, call [phone] 10/30/2015, 11:51 PM

## 2015-10-30 NOTE — ED Provider Notes (Signed)
CSN: [id number]     Arrival date & time 10/30/15  1439 History   First MD Initiated Contact with Patient 10/30/15 1915     Chief Complaint  Patient presents with  . Shortness of Breath  . Leg Swelling   HPI  Pt sent here from Dr Delene Ruffini office for presumed CHF exacerbation. However, patient states he is 10 pounds lighter than his previous weight check at his primary care provider. He states his leg swelling is at baseline.   Sob for over a year but worse over last month Cough x for one week Some cp - discomfort, no radiation, not acutely worse; Cp not worse with exertion No fevers Lightheaded Difficult to ambulate  Patient takes Lasix but has not recently increased the dose, he states his shortness of breath does not seem to improve with Lasix.  Denies recent surgeries, hemoptysis, history of PE or DVT, recent long trips or other immobilization, syncope, symmetrical leg pain or swelling, active cancer  Past Medical History  Diagnosis Date  . Essential hypertension   . Type II diabetes mellitus ([place])   . Pneumonia 2007  . Cervical disc disease   . Dyslipidemia   . CKD (chronic kidney disease), stage III   . GERD (gastroesophageal reflux disease)   . PAF (paroxysmal atrial fibrillation) ([place])     a. Dx 02/2015 - in and out on tele, rx'd Coumadin and amiodarone.  . Chronic diastolic CHF (congestive heart failure) ([place])     a. Dx 02/2015 -  2D echo 03/02/15 showed severe focal basal hypertrophy of the septum, EF 55-60%, mild MR, no effusion.  . Coronary artery calcification seen on CT scan     a. Nuc 02/2015 was normal.  . Bradycardia     a. during 02/2015 admission - occasional HR in 40s while being treated for atrial fib.  . 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 Eye surgery  1930's    "don't know what for" (05/11/2013)  . Cholecystectomy    . Cervical disc surgery      "i've  had 3 neck ORs; not sure what kind; all thru the back of my neck"  . Posterior fusion cervical spine    . Knee arthroscopy      "had one scope; one open knee OR; not sure which on which side" (05/10/2013)  . Knee arthroplasty      "had one scope; one open knee OR; not sure which on which side" (05/10/2013)  . Rotator cuff repair Bilateral   . Joint replacement      bilateral knees, elbows, and shoulders (on 05/11/2013 pt denies all joint replacements"   . Cataract extraction w/ intraocular lens  implant, bilateral Bilateral    Family History  Problem Relation Age of Onset  . Heart disease Neg Hx     He does not know his father's history.   . Cancer Neg Hx   . Diabetes Neg Hx   . CAD Neg Hx   . Diabetes Mellitus II Paternal Grandmother   . Lung disease Neg Hx   . Rheumatologic disease Neg Hx    Social History  Substance Use Topics  . Smoking status: Never Smoker   . Smokeless tobacco: Former Systems developer    Types: Chew  . Alcohol Use: No    Review of Systems  Respiratory: Positive for cough.   Allergic/Immunologic: Negative for  immunocompromised state.  All other systems reviewed and are negative.     Allergies  Nifedipine  Home Medications   Prior to Admission medications   Medication Sig Start Date End Date Taking? Authorizing Provider  atorvastatin (LIPITOR) 80 MG tablet Take 40 mg by mouth daily. 12/27/13   Historical Provider, MD  Cholecalciferol (VITAMIN D-3) 1000 UNITS CAPS Take 1 capsule by mouth daily.    Historical Provider, MD  furosemide (LASIX) 40 MG tablet Take 40 mg by mouth daily.    Historical Provider, MD  insulin detemir (LEVEMIR) 100 UNIT/ML injection Inject 0.1 mLs (10 Units total) into the skin at bedtime. Patient taking differently: Inject 25-35 Units into the skin See admin instructions. 35 units in the morning and 25 units at night 03/07/15   Nita Sells, MD  insulin lispro (HUMALOG) 100 UNIT/ML injection Inject 10-30 Units into the skin 3  (three) times daily before meals.    Historical Provider, MD  lisinopril (PRINIVIL,ZESTRIL) 10 MG tablet Take 1 tablet (10 mg total) by mouth daily. 07/17/15   Praveen Mannam, MD  metoprolol (LOPRESSOR) 50 MG tablet Take 50 mg by mouth 2 (two) times daily.    Historical Provider, MD  omeprazole (PRILOSEC) 20 MG capsule Take 20 mg by mouth daily.  02/26/13   Historical Provider, MD  vitamin B-12 (CYANOCOBALAMIN) 1000 MCG tablet Take 1,000 mcg by mouth daily.    Historical Provider, MD  warfarin (COUMADIN) 2.5 MG tablet Take 1 tablet (2.5 mg total) by mouth one time only at 6 PM. 03/07/15   Nita Sells, MD   BP 146/52 mmHg  Pulse 62  Temp(Src) 97.7 F (36.5 C) (Oral)  Resp 18  Ht 5\' 6"  (1.676 m)  Wt 85.276 kg  BMI 30.36 kg/m2  SpO2 98% Physical Exam  Constitutional: He is oriented to person, place, and time. He appears well-developed and well-nourished. No distress.  HENT:  Head: Normocephalic and atraumatic.  Eyes: Conjunctivae are normal. Right eye exhibits no discharge. Left eye exhibits no discharge.  Neck: Normal range of motion.  Cardiovascular: Normal rate, regular rhythm, normal heart sounds and intact distal pulses.   No murmur heard. Pulmonary/Chest: No respiratory distress. He has rales (crackles bilaterally at lower lobes). He exhibits no tenderness.  Productive cough, shallow brief respirations, intermittently satting 91% on room air without any exertion  Abdominal: Soft. Bowel sounds are normal. He exhibits no distension.  Musculoskeletal: Normal range of motion. He exhibits edema (Bilateral 1+ pitting edema of lower extremities, no asymmetry). He exhibits no tenderness (No calf tenderness).  Neurological: He is alert and oriented to person, place, and time.  Skin: Skin is warm. He is not diaphoretic.  Nursing note and vitals reviewed.   ED Course  Procedures (including critical care time) Labs Review Labs Reviewed  BASIC METABOLIC PANEL - Abnormal; Notable for  the following:    Chloride 100 (*)    Glucose, Bld 164 (*)    BUN 37 (*)    Creatinine, Ser 2.37 (*)    GFR calc non Af Amer 24 (*)    GFR calc Af Amer 28 (*)    All other components within normal limits  CBC - Abnormal; Notable for the following:    WBC 17.5 (*)    All other components within normal limits  BRAIN NATRIURETIC PEPTIDE - Abnormal; Notable for the following:    B Natriuretic Peptide 130.9 (*)    All other components within normal limits  PROTIME-INR - Abnormal; Notable for the  following:    Prothrombin Time 23.9 (*)    INR 2.16 (*)    All other components within normal limits  LACTIC ACID, PLASMA - Abnormal; Notable for the following:    Lactic Acid, Venous 3.5 (*)    All other components within normal limits  SEDIMENTATION RATE - Abnormal; Notable for the following:    Sed Rate 38 (*)    All other components within normal limits  C-REACTIVE PROTEIN - Abnormal; Notable for the following:    CRP 14.9 (*)    All other components within normal limits  TROPONIN I - Abnormal; Notable for the following:    Troponin I 0.04 (*)    All other components within normal limits  CBG MONITORING, ED - Abnormal; Notable for the following:    Glucose-Capillary 168 (*)    All other components within normal limits  CBG MONITORING, ED - Abnormal; Notable for the following:    Glucose-Capillary 215 (*)    All other components within normal limits  CBG MONITORING, ED - Abnormal; Notable for the following:    Glucose-Capillary 258 (*)    All other components within normal limits  ACID FAST SMEAR (AFB)  ACID FAST CULTURE WITH REFLEXED SENSITIVITIES  FUNGUS CULTURE WITH STAIN (NOT AT [facility])  CULTURE, EXPECTORATED SPUTUM-ASSESSMENT  GRAM STAIN  LACTIC ACID, PLASMA  PROCALCITONIN  URINALYSIS, ROUTINE W REFLEX MICROSCOPIC (NOT AT [facility])  ANTINUCLEAR ANTIBODIES, IFA  RHEUMATOID FACTOR  CYCLIC CITRUL PEPTIDE ANTIBODY, IGG/IGA  STREP PNEUMONIAE URINARY ANTIGEN  COMPREHENSIVE METABOLIC  PANEL  CBC WITH DIFFERENTIAL/PLATELET  LEGIONELLA PNEUMOPHILA TOTAL AB  HEMOGLOBIN A1C  TROPONIN I  TROPONIN I  PROTIME-INR  LACTIC ACID, PLASMA  I-STAT TROPOININ, ED    Imaging Review Dg Chest 2 View  10/30/2015  CLINICAL DATA:  Shortness of breath for 2 months EXAM: CHEST  2 VIEW COMPARISON:  06/12/2015 FINDINGS: Cardiac shadow is within normal limits. Lungs are clear bilaterally. Chronic changes are noted in the thoracic spine. No acute abnormality seen. IMPRESSION: No acute abnormality noted. Electronically Signed   By: Inez Catalina M.D.   On: 10/30/2015 16:01   I have personally reviewed and evaluated these images and lab results as part of my medical decision-making.   EKG Interpretation   Date/Time:  Monday October 30 2015 14:49:05 EDT Ventricular Rate:  61 PR Interval:  232 QRS Duration: 76 QT Interval:  442 QTC Calculation: 444 R Axis:   -27 Text Interpretation:  Sinus rhythm with 1st degree A-V block Otherwise  normal ECG Confirmed by Hazle Coca [phone]) on 10/30/2015 6:39:56 PM      MDM   Final diagnoses:  Cough  GERD (gastroesophageal reflux disease)  Pulmonary fibrosis (HCC)   CXR without pneumonia. Given leukocytosis, crackles on exam, worsening shortness of breath, lactic acid of 3.5, concern for underlying infection that is identified on the chest x-ray. His EKG and history not consistent with ACS spelled mild troponin elevation. Heparin held. Patient is chest pain-free at this time. Out pulmonary embolism at this time given her atypical presentation and otherwise low risk. Doubt dissection. CHF is less likely given the weight loss and BNP levels. Spoke with inpatient team and will admit for further monitoring and workup as well as antibiotics.    Karma Greaser, MD 10/31/15 [id number]  Ripley Fraise, MD 10/31/15 [phone]

## 2015-10-30 NOTE — ED Notes (Signed)
Pulmonologist at bedside

## 2015-10-31 ENCOUNTER — Other Ambulatory Visit (HOSPITAL_COMMUNITY): Payer: BLUE CROSS/BLUE SHIELD

## 2015-10-31 ENCOUNTER — Observation Stay (HOSPITAL_COMMUNITY): Payer: BLUE CROSS/BLUE SHIELD

## 2015-10-31 ENCOUNTER — Observation Stay (HOSPITAL_BASED_OUTPATIENT_CLINIC_OR_DEPARTMENT_OTHER): Payer: BLUE CROSS/BLUE SHIELD

## 2015-10-31 DIAGNOSIS — J8410 Pulmonary fibrosis, unspecified: Secondary | ICD-10-CM | POA: Diagnosis not present

## 2015-10-31 DIAGNOSIS — N179 Acute kidney failure, unspecified: Secondary | ICD-10-CM | POA: Diagnosis not present

## 2015-10-31 DIAGNOSIS — R1310 Dysphagia, unspecified: Secondary | ICD-10-CM | POA: Diagnosis not present

## 2015-10-31 DIAGNOSIS — R918 Other nonspecific abnormal finding of lung field: Secondary | ICD-10-CM | POA: Diagnosis not present

## 2015-10-31 DIAGNOSIS — I5032 Chronic diastolic (congestive) heart failure: Secondary | ICD-10-CM | POA: Diagnosis not present

## 2015-10-31 DIAGNOSIS — J9601 Acute respiratory failure with hypoxia: Secondary | ICD-10-CM | POA: Diagnosis not present

## 2015-10-31 DIAGNOSIS — E1122 Type 2 diabetes mellitus with diabetic chronic kidney disease: Secondary | ICD-10-CM | POA: Diagnosis not present

## 2015-10-31 DIAGNOSIS — N183 Chronic kidney disease, stage 3 (moderate): Secondary | ICD-10-CM | POA: Diagnosis not present

## 2015-10-31 DIAGNOSIS — J40 Bronchitis, not specified as acute or chronic: Secondary | ICD-10-CM | POA: Diagnosis not present

## 2015-10-31 DIAGNOSIS — I480 Paroxysmal atrial fibrillation: Secondary | ICD-10-CM | POA: Diagnosis not present

## 2015-10-31 DIAGNOSIS — R0600 Dyspnea, unspecified: Secondary | ICD-10-CM | POA: Diagnosis not present

## 2015-10-31 DIAGNOSIS — I10 Essential (primary) hypertension: Secondary | ICD-10-CM | POA: Diagnosis not present

## 2015-10-31 LAB — URINE MICROSCOPIC-ADD ON

## 2015-10-31 LAB — URINALYSIS, ROUTINE W REFLEX MICROSCOPIC
Bilirubin Urine: NEGATIVE
GLUCOSE, UA: NEGATIVE mg/dL
HGB URINE DIPSTICK: NEGATIVE
KETONES UR: NEGATIVE mg/dL
LEUKOCYTES UA: NEGATIVE
Nitrite: NEGATIVE
PROTEIN: 100 mg/dL — AB
Specific Gravity, Urine: 1.021 (ref 1.005–1.030)
pH: 5 (ref 5.0–8.0)

## 2015-10-31 LAB — ECHOCARDIOGRAM COMPLETE
CHL CUP MV DEC (S): 211
E decel time: 211 msec
FS: 36 % (ref 28–44)
Height: 66 in
IVS/LV PW RATIO, ED: 0.83
LA ID, A-P, ES: 46 mm
LA vol A4C: 61.1 ml
LA vol index: 32.1 mL/m2
LA vol: 64.6 mL
LADIAMINDEX: 2.29 cm/m2
LDCA: 2.84 cm2
LEFT ATRIUM END SYS DIAM: 46 mm
LV PW d: 12 mm — AB (ref 0.6–1.1)
LV e' LATERAL: 11.7 cm/s
LVOT diameter: 19 mm
Lateral S' vel: 12.6 cm/s
MV pk E vel: 1.1 m/s
TDI e' lateral: 11.7
TDI e' medial: 6.31
Weight: 2984 oz

## 2015-10-31 LAB — CBC WITH DIFFERENTIAL/PLATELET
BASOS ABS: 0 10*3/uL (ref 0.0–0.1)
BASOS PCT: 0 %
Eosinophils Absolute: 0 10*3/uL (ref 0.0–0.7)
Eosinophils Relative: 0 %
HEMATOCRIT: 36 % — AB (ref 39.0–52.0)
HEMOGLOBIN: 11.3 g/dL — AB (ref 13.0–17.0)
Lymphocytes Relative: 9 %
Lymphs Abs: 1.1 10*3/uL (ref 0.7–4.0)
MCH: 28.9 pg (ref 26.0–34.0)
MCHC: 31.4 g/dL (ref 30.0–36.0)
MCV: 92.1 fL (ref 78.0–100.0)
Monocytes Absolute: 1.4 10*3/uL — ABNORMAL HIGH (ref 0.1–1.0)
Monocytes Relative: 11 %
NEUTROS ABS: 10.4 10*3/uL — AB (ref 1.7–7.7)
Neutrophils Relative %: 80 %
Platelets: 181 10*3/uL (ref 150–400)
RBC: 3.91 MIL/uL — AB (ref 4.22–5.81)
RDW: 15.4 % (ref 11.5–15.5)
WBC: 13 10*3/uL — ABNORMAL HIGH (ref 4.0–10.5)

## 2015-10-31 LAB — TROPONIN I
TROPONIN I: 0.04 ng/mL — AB (ref <0.031)
TROPONIN I: 0.05 ng/mL — AB (ref <0.031)
Troponin I: 0.05 ng/mL — ABNORMAL HIGH (ref <0.031)

## 2015-10-31 LAB — C-REACTIVE PROTEIN: CRP: 14.9 mg/dL — AB (ref <1.0)

## 2015-10-31 LAB — COMPREHENSIVE METABOLIC PANEL
ALK PHOS: 80 U/L (ref 38–126)
ALT: 20 U/L (ref 17–63)
ANION GAP: 6 (ref 5–15)
AST: 23 U/L (ref 15–41)
Albumin: 2.6 g/dL — ABNORMAL LOW (ref 3.5–5.0)
BILIRUBIN TOTAL: 0.5 mg/dL (ref 0.3–1.2)
BUN: 45 mg/dL — ABNORMAL HIGH (ref 6–20)
CALCIUM: 8.7 mg/dL — AB (ref 8.9–10.3)
CO2: 30 mmol/L (ref 22–32)
Chloride: 101 mmol/L (ref 101–111)
Creatinine, Ser: 2.44 mg/dL — ABNORMAL HIGH (ref 0.61–1.24)
GFR calc non Af Amer: 23 mL/min — ABNORMAL LOW (ref >60)
GFR, EST AFRICAN AMERICAN: 27 mL/min — AB (ref >60)
Glucose, Bld: 178 mg/dL — ABNORMAL HIGH (ref 65–99)
Potassium: 4.7 mmol/L (ref 3.5–5.1)
SODIUM: 137 mmol/L (ref 135–145)
TOTAL PROTEIN: 5.2 g/dL — AB (ref 6.5–8.1)

## 2015-10-31 LAB — GLUCOSE, CAPILLARY
GLUCOSE-CAPILLARY: 166 mg/dL — AB (ref 65–99)
GLUCOSE-CAPILLARY: 99 mg/dL (ref 65–99)
Glucose-Capillary: 230 mg/dL — ABNORMAL HIGH (ref 65–99)
Glucose-Capillary: 143 mg/dL — ABNORMAL HIGH (ref 65–99)
Glucose-Capillary: 117 mg/dL — ABNORMAL HIGH (ref 65–99)

## 2015-10-31 LAB — PROTIME-INR
INR: 2.27 — ABNORMAL HIGH (ref 0.00–1.49)
Prothrombin Time: 24.8 seconds — ABNORMAL HIGH (ref 11.6–15.2)

## 2015-10-31 LAB — LACTIC ACID, PLASMA
LACTIC ACID, VENOUS: 1.2 mmol/L (ref 0.5–2.0)
LACTIC ACID, VENOUS: 3.5 mmol/L — AB (ref 0.5–2.0)

## 2015-10-31 LAB — PROCALCITONIN: PROCALCITONIN: 0.31 ng/mL

## 2015-10-31 LAB — STREP PNEUMONIAE URINARY ANTIGEN: Strep Pneumo Urinary Antigen: NEGATIVE

## 2015-10-31 LAB — SEDIMENTATION RATE: SED RATE: 38 mm/h — AB (ref 0–16)

## 2015-10-31 MED ORDER — SODIUM CHLORIDE 0.9 % IV BOLUS (SEPSIS)
500.0000 mL | Freq: Once | INTRAVENOUS | Status: AC
  Administered 2015-10-31: 500 mL via INTRAVENOUS

## 2015-10-31 MED ORDER — WARFARIN SODIUM 3 MG PO TABS
3.0000 mg | ORAL_TABLET | Freq: Once | ORAL | Status: AC
  Administered 2015-10-31: 3 mg via ORAL
  Filled 2015-10-31: qty 1

## 2015-10-31 MED ORDER — INSULIN DETEMIR 100 UNIT/ML ~~LOC~~ SOLN
35.0000 [IU] | Freq: Every day | SUBCUTANEOUS | Status: DC
  Administered 2015-10-31: 35 [IU] via SUBCUTANEOUS
  Filled 2015-10-31 (×3): qty 0.35

## 2015-10-31 MED ORDER — INSULIN DETEMIR 100 UNIT/ML ~~LOC~~ SOLN
25.0000 [IU] | Freq: Every day | SUBCUTANEOUS | Status: DC
  Administered 2015-10-31 – 2015-11-03 (×5): 25 [IU] via SUBCUTANEOUS
  Filled 2015-10-31 (×6): qty 0.25

## 2015-10-31 MED ORDER — WARFARIN - PHARMACIST DOSING INPATIENT
Freq: Every day | Status: DC
  Administered 2015-10-31 – 2015-11-01 (×2)

## 2015-10-31 NOTE — Progress Notes (Addendum)
Patient ID: Justin Leach, male   DOB: 15-Jun-1931, 82 y.o.   MRN: [id number]   PROGRESS NOTE    Justin Leach  [id number] DOB: 01/02/1932 DOA: 10/30/2015  PCP: Irven Shelling, MD   Brief Narrative:  82 year old male with atrial fibrillation, diastolic congestive heart failure, & chronic renal failure, presented to [facility] ED for evaluation of several months duration of progressively worsening dyspnea, cough productive of greenish sputum, malaise.   Assessment & Plan:   Dyspnea secondary to ? Acute Bronchitis - pt reports persistent dyspnea - PCCM consulted  - continue Zithromax and Rocephin day #2 - Follow cultures  ILD - per PCCM, will follow, ANA, rheumatoid factor, and anti-CCP.  - Holding on initiating immunosuppression/steroids at this time per PCCM   GERD/ Dysphagia, presbyesophagus  - Continue PPI  Acute on chronic kidney disease, stage III - monitor renal function   DM type II with complications of nephropathy - continue home regimen with insulin - added SSI  Atrial fib, CHADS 2 score 4 - continue Coumadin  - rate controlled  Obesity  - Body mass index is 30.12 kg/(m^2).  DVT prophylaxis: SCD Code Status: Full  Family Communication: Patient at bedside  Disposition Plan: Home once cleared by PCCM   Consultants:   PCCM  Procedures and recent studies: TTE (03/02/15): LV normal in size with severe focal basal septal hypertrophy. EF 55-60%. Normal wall motion. Unable to assess diastolic dysfunction due to atrial fibrillation. LA & RA normal in size. RV normal in size and function. No aortic stenosis or regurgitation. Normal aortic root size. Trivial mitral regurgitation without stenosis. No pulmonic stenosis. Mild tricuspid regurgitation. Pulmonary artery normal in size. No pericardial effusion.  PFT (04/26/15): FVC 1.59 L (82%) FEV1 1.25 L (89%) FEV1/FVC 0.79 FEF 25-75 1.28 L (93%) negative bronchodilator response TLC 2.69 L (45%) RV 55% ERV 38% DLCO  uncorrected 53%  HRCT CHEST W/O 06/12/15 No pleural effusion or thickening. No pathologic mediastinal adenopathy or mediastinal mass. No pericardial effusion. 7 mm nodule in right lower lobe noted. No traction bronchiectasis or honeycomb changes. Predominantly dependent subpleural reticular changes and small amount of groundglass opacification. This appears more prevalent within the lower lung zones.  Antimicrobials:   Zithromax 6/12 -->  Rocephin 6/12 -->    Subjective: Still with dyspnea.   Objective: Filed Vitals:   10/31/15 0000 10/31/15 0143 10/31/15 0521 10/31/15 1139  BP: 136/44 137/63 144/42 131/44  Pulse: 76 88 61 59  Temp:  98.3 F (36.8 C) 98.1 F (36.7 C) 98.6 F (37 C)  TempSrc:  Oral Oral Oral  Resp: 20 20 20 18   Height:  5\' 6"  (1.676 m)    Weight:  84.596 kg (186 lb 8 oz)    SpO2: 91% 93% 94% 96%    Intake/Output Summary (Last 24 hours) at 10/31/15 1830 Last data filed at 10/31/15 1828  Gross per 24 hour  Intake   1190 ml  Output    401 ml  Net    789 ml   Filed Weights   10/30/15 1454 10/30/15 1534 10/31/15 0143  Weight: 85.276 kg (188 lb) 85.276 kg (188 lb) 84.596 kg (186 lb 8 oz)    Examination:  General exam: Appears calm and comfortable  Respiratory system: Clear to auscultation. Respiratory effort normal. Cardiovascular system: S1 & S2 heard, RRR. No JVD, murmurs, rubs, gallops or clicks. No pedal edema. Gastrointestinal system: Abdomen is nondistended, soft and nontender. No organomegaly or masses felt.  Central nervous system:  Alert and oriented. No focal neurological deficits. Extremities: Symmetric 5 x 5 power. Skin: No rashes, lesions or ulcers Psychiatry: Judgement and insight appear normal. Mood & affect appropriate.   Data Reviewed: I have personally reviewed following labs and imaging studies  CBC:  Recent Labs Lab 10/30/15 1739 10/31/15 0538  WBC 17.5* 13.0*  NEUTROABS  --  10.4*  HGB 13.5 11.3*  HCT 42.6 36.0*  MCV 92.8  92.1  PLT 210 0000000   Basic Metabolic Panel:  Recent Labs Lab 10/30/15 1739 10/31/15 0538  NA 137 137  K 5.0 4.7  CL 100* 101  CO2 27 30  GLUCOSE 164* 178*  BUN 37* 45*  CREATININE 2.37* 2.44*  CALCIUM 9.4 8.7*   Liver Function Tests:  Recent Labs Lab 10/31/15 0538  AST 23  ALT 20  ALKPHOS 80  BILITOT 0.5  PROT 5.2*  ALBUMIN 2.6*   Coagulation Profile:  Recent Labs Lab 10/30/15 1956 10/31/15 0538  INR 2.16* 2.27*   Cardiac Enzymes:  Recent Labs Lab 10/30/15 2355 10/31/15 0536 10/31/15 1156  TROPONINI 0.04* 0.05* 0.05*   CBG:  Recent Labs Lab 10/30/15 2358 10/31/15 0201 10/31/15 0626 10/31/15 1141 10/31/15 1640  GLUCAP 258* 230* 166* 143* 99   Urine analysis:    Component Value Date/Time   COLORURINE YELLOW 10/31/2015 0700   APPEARANCEUR CLEAR 10/31/2015 0700   LABSPEC 1.021 10/31/2015 0700   PHURINE 5.0 10/31/2015 0700   GLUCOSEU NEGATIVE 10/31/2015 0700   HGBUR NEGATIVE 10/31/2015 0700   BILIRUBINUR NEGATIVE 10/31/2015 0700   KETONESUR NEGATIVE 10/31/2015 0700   PROTEINUR 100* 10/31/2015 0700   UROBILINOGEN 0.2 05/11/2013 1632   NITRITE NEGATIVE 10/31/2015 0700   LEUKOCYTESUR NEGATIVE 10/31/2015 0700    Radiology Studies: Dg Chest 2 View  10/30/2015  CLINICAL DATA:  Shortness of breath for 2 months EXAM: CHEST  2 VIEW COMPARISON:  06/12/2015 FINDINGS: Cardiac shadow is within normal limits. Lungs are clear bilaterally. Chronic changes are noted in the thoracic spine. No acute abnormality seen. IMPRESSION: No acute abnormality noted. Electronically Signed   By: Inez Catalina M.D.   On: 10/30/2015 16:01   Dg Esophagus  10/31/2015  CLINICAL DATA:  82 year old with occasional dysphagia. History of gastroesophageal reflux disease and shortness of breath. EXAM: ESOPHOGRAM/BARIUM SWALLOW TECHNIQUE: Single contrast examination was performed using  thin barium. FLUOROSCOPY TIME:  Radiation Exposure Index (as provided by the fluoroscopic device):  190.65 uGy*m2 If the device does not provide the exposure index: Fluoroscopy Time:  1 minute and 18 seconds of pulsed fluoro Number of Acquired Images:  Fluoro only COMPARISON:  Chest CT 10/31/2015 FINDINGS: Study was performed in the erect and prone positions. The patient swallowed the barium without difficulty. Rapid sequence imaging of the pharynx in the lateral and AP projections demonstrates no mucosal lesions or laryngeal penetration. There is mild esophageal dysmotility with a decreased primary stripping wave and increased tertiary contractions. No evidence of esophageal mass, stricture or ulceration. No significant hiatal hernia or reflux demonstrated. Water siphon test not performed as patient could not lie supine. A 13 mm barium tablet was administered and passed without delay into the stomach. IMPRESSION: Mild presbyesophagus.  No anatomic abnormalities identified. Electronically Signed   By: Richardean Sale M.D.   On: 10/31/2015 11:18   Ct Chest High Resolution  10/31/2015  CLINICAL DATA:  82 year old male with progressively worsening cough and shortness of breath. Evaluate for potential nonspecific interstitial pneumonia (NSIP) versus amiodarone induced lung injury. EXAM: CT CHEST WITHOUT CONTRAST  TECHNIQUE: Multidetector CT imaging of the chest was performed following the standard protocol without intravenous contrast. High resolution imaging of the lungs, as well as inspiratory and expiratory imaging, was performed. COMPARISON:  None. FINDINGS: Mediastinum/Lymph Nodes: Heart size is mildly enlarged. There is no significant pericardial fluid, thickening or pericardial calcification. There is atherosclerosis of the thoracic aorta, the great vessels of the mediastinum and the coronary arteries, including calcified atherosclerotic plaque in the left main, left anterior descending than left circumflex coronary arteries. No pathologically enlarged mediastinal or hilar lymph nodes. Please note that  accurate exclusion of hilar adenopathy is limited on noncontrast CT scans. Small hiatal hernia. Lungs/Pleura: High-resolution images demonstrate patchy areas of ground-glass attenuation and mild septal thickening, most evident in the lung bases. There are no areas of significant traction bronchiectasis or frank honeycombing. Inspiratory and expiratory imaging demonstrates some mild air trapping, indicative of small airways disease. No acute consolidative airspace disease. No pleural effusions. 7 x 5 mm (mean diameter 6 mm) subpleural nodule in the periphery of the right lower lobe (image 65 of series 5) is unchanged. There is a new elongated pleural-based density in the medial aspect of the left upper lobe (image 19 of series 5), which is presumably a focal area of atelectasis or developing scarring. Small amount of dependent atelectasis is also noted in the lower lobes of the lungs bilaterally. As noted on the prior examination, there is a small nodular prominence along the lateral wall of the proximal bronchus intermedius measuring approximately 6 mm (axial image 56 of series 5 and coronal image 53 of series 6). Upper abdomen: Status post cholecystectomy. Extensive atherosclerosis. Musculoskeletal: Orthopedic fixation hardware in the lower cervical spine incidentally noted. There are no aggressive appearing lytic or blastic lesions noted in the visualized portions of the skeleton. IMPRESSION: 1. The appearance of the lungs appears very similar to the prior examination and is favored to reflect mild nonspecific interstitial pneumonia (NSIP). 2. 6 mm endobronchial nodule associated with the lateral margin of the proximal bronchus intermedius, similar to the prior study. This could represent an endobronchial polyp or other neoplasm. Correlation with bronchoscopy for direct inspection and potential biopsy is suggested if clinically appropriate. 3. Stable subpleural nodule with mean diameter of 6 mm in the periphery of  the right lower lobe, favored to be a benign subpleural lymph node. Repeat CT at 18-24 months (from scan dated 06/12/2015) is considered optional for low-risk patients, but is recommended for high-risk patients. This recommendation follows the consensus statement: Guidelines for Management of Incidental Pulmonary Nodules Detected on CT Images:From the Fleischner Society 2017; published online before print (10.1148/radiol.IJ:2314499). 4. Atherosclerosis, including left main and 2 vessel coronary artery disease. 5. Small hiatal hernia. 6. Mild cardiomegaly. 7. Additional incidental findings, as above. Electronically Signed   By: Vinnie Langton M.D.   On: 10/31/2015 08:55      Scheduled Meds: . atorvastatin  40 mg Oral Daily  . azithromycin  500 mg Intravenous Q24H  . cefTRIAXone (ROCEPHIN)  IV  1 g Intravenous Q24H  . insulin aspart  0-15 Units Subcutaneous TID WC  . insulin aspart  0-5 Units Subcutaneous QHS  . insulin detemir  35 Units Subcutaneous Daily   And  . insulin detemir  25 Units Subcutaneous QHS  . metoprolol  50 mg Oral BID  . pantoprazole  40 mg Oral Daily  . sodium chloride flush  3 mL Intravenous Q12H  . Warfarin - Pharmacist Dosing Inpatient   Does not apply [phone]  Continuous Infusions:       Time spent: 20 minutes    Faye Ramsay, MD [facility] Pager [phone]  If 7PM-7AM, please contact night-coverage [email or web address] Password [email or web address] 10/31/2015, 6:30 PM

## 2015-10-31 NOTE — ED Notes (Signed)
Pt taken to CT.

## 2015-10-31 NOTE — Progress Notes (Signed)
  Echocardiogram 2D Echocardiogram has been performed.  Justin Leach 10/31/2015, 2:54 PM

## 2015-10-31 NOTE — Progress Notes (Signed)
ANTICOAGULATION CONSULT NOTE - Initial Consult  Pharmacy Consult for Coumadin Indication: atrial fibrillation  Allergies  Allergen Reactions  . Nifedipine Other (See Comments)    Reaction: made gums swell up. Pt states that he had to have gum surgery after taking.     Patient Measurements: Height: 5\' 6"  (167.6 cm) Weight: 186 lb 8 oz (84.596 kg) IBW/kg (Calculated) : 63.8  Vital Signs: Temp: 98.6 F (37 C) (06/13 1139) Temp Source: Oral (06/13 1139) BP: 131/44 mmHg (06/13 1139) Pulse Rate: 59 (06/13 1139)  Labs:  Recent Labs  10/30/15 1739 10/30/15 1956 10/30/15 2355 10/31/15 0536 10/31/15 0538 10/31/15 1156  HGB 13.5  --   --   --  11.3*  --   HCT 42.6  --   --   --  36.0*  --   PLT 210  --   --   --  181  --   LABPROT  --  23.9*  --   --  24.8*  --   INR  --  2.16*  --   --  2.27*  --   CREATININE 2.37*  --   --   --  2.44*  --   TROPONINI  --   --  0.04* 0.05*  --  0.05*    Estimated Creatinine Clearance: 24.2 mL/min (by C-G formula based on Cr of 2.44).   Medical History: Past Medical History  Diagnosis Date  . Essential hypertension   . Type II diabetes mellitus ([place])   . Pneumonia 2007  . Cervical disc disease   . Dyslipidemia   . CKD (chronic kidney disease), stage III   . GERD (gastroesophageal reflux disease)   . PAF (paroxysmal atrial fibrillation) ([place])     a. Dx 02/2015 - in and out on tele, rx'd Coumadin and amiodarone.  . Chronic diastolic CHF (congestive heart failure) ([place])     a. Dx 02/2015 -  2D echo 03/02/15 showed severe focal basal hypertrophy of the septum, EF 55-60%, mild MR, no effusion.  . Coronary artery calcification seen on CT scan     a. Nuc 02/2015 was normal.  . Bradycardia     a. during 02/2015 admission - occasional HR in 40s while being treated for atrial fib.  . ILD (interstitial lung disease) ([facility])     NSIP vs Amiodarone Induced Lung Injury    Medications:  No current facility-administered medications on file  prior to encounter.   Current Outpatient Prescriptions on File Prior to Encounter  Medication Sig Dispense Refill  . atorvastatin (LIPITOR) 80 MG tablet Take 40 mg by mouth daily.  2  . Cholecalciferol (VITAMIN D-3) 1000 UNITS CAPS Take 1 capsule by mouth daily.    . furosemide (LASIX) 40 MG tablet Take 40 mg by mouth daily.    . insulin detemir (LEVEMIR) 100 UNIT/ML injection Inject 0.1 mLs (10 Units total) into the skin at bedtime. (Patient taking differently: Inject 25-35 Units into the skin See admin instructions. 35 units in the morning and 25 units at night) 10 mL 11  . insulin lispro (HUMALOG) 100 UNIT/ML injection Inject 10-30 Units into the skin 3 (three) times daily before meals.    Marland Kitchen lisinopril (PRINIVIL,ZESTRIL) 10 MG tablet Take 1 tablet (10 mg total) by mouth daily.    . metoprolol (LOPRESSOR) 50 MG tablet Take 50 mg by mouth 2 (two) times daily.    Marland Kitchen omeprazole (PRILOSEC) 20 MG capsule Take 20 mg by mouth daily.     Marland Kitchen  vitamin B-12 (CYANOCOBALAMIN) 1000 MCG tablet Take 1,000 mcg by mouth daily.    Marland Kitchen warfarin (COUMADIN) 2.5 MG tablet Take 1 tablet (2.5 mg total) by mouth one time only at 6 PM. 7 tablet 0   Assessment: 82 y.o. male admitted 10/30/15 with SOB/acute bronchitis, h/o Afib, to continue Coumadin.   I contacted [facility]. They report Warfarin 2mg  last filled on 10/18/15.  Instructions were to take 3mg  qMWF and 2mg  qTTSS.   Patient reports to medication history pharmacy technician that he takes 3mg  daily and last taken PTA on 10/29/15.   INR on admit = 2.16 last night.  INR = 2.27 this AM. Coumadin last taken on 6/11, none given on 6/12 admission date.  Hgb decreased from 13.5 to 11.3, pltc wnl at 181k . No bleeding noted. Scr 2.37>2.44.  Now on Day #2 Azithromycin which can increased the coumadin effect.     Goal of Therapy:  INR 2-3 Monitor platelets by anticoagulation protocol: Yes   Plan:  Give Coumadin 3 mg today x1 Daily INR Watch for drug interaction  with azithromycin  Nicole Cella, RPh Clinical Pharmacist Pager: [phone] 10/31/2015 3:03 PM

## 2015-10-31 NOTE — Progress Notes (Signed)
Name: Justin Leach MRN: [id number] DOB: [birth year]    ADMISSION DATE:  10/30/2015 CONSULTATION DATE:  10/30/2015  REFERRING MD :  Toy Baker, M.D. / [facility]  CHIEF COMPLAINT:  Acute Bronchitis w/ Possible ILD Flare  BRIEF PATIENT DESCRIPTION: 82 year old male followed by Dr. Vaughan Browner. Admitted October 2016 with new onset atrial fibrillation, diastolic congestive heart failure, & acute on chronic renal failure. Placed on amiodarone at that time. Chest imaging in January showed new findings concerning for amiodaroollowed by Dr. Vaughan Browner. Admitted October 2016 with new onset atrial fibrillation, diastolic congestive heart failure, & acute on chne toxicity versus NSIP. Patient subsequently taken off amiodarone.   SIGNIFICANT EVENTS  6/12 - Admit  STUDIES:  TTE (03/02/15): LV normal in size with severe focal basal septal hypertrophy. EF 55-60%. Normal wall motion. Unable to assess diastolic dysfunction due to atrial fibrillation. LA & RA normal in size. RV normal in size and function. No aortic stenosis or regurgitation. Normal aortic root size. Trivial mitral regurgitation without stenosis. No pulmonic stenosis. Mild tricuspid regurgitation. Pulmonary artery normal in size. No pericardial effusion.  PFT (04/26/15): FVC 1.59 L (82%) FEV1 1.25 L (89%) FEV1/FVC 0.79 FEF 25-75 1.28 L (93%) negative bronchodilator response TLC 2.69 L (45%) RV 55% ERV 38% DLCO uncorrected 53%  HRCT CHEST W/O 06/12/15 (personally reviewed by me): No pleural effusion or thickening. No pathologic mediastinal adenopathy or mediastinal mass. No pericardial effusion. 7 mm nodule in right lower lobe noted. No traction bronchiectasis or honeycomb changes. Predominantly dependent subpleural reticular changes and small amount of groundglass opacification. This appears more prevalent within the lower lung zones.  CXR PA/LAT 10/30/15 (personally reviewed by me): Heart borderline normal in size. Mediastinum normal in contour.  Slightly low lung volumes with kyphosis noted on lateral view. No parenchymal opacity or mass appreciated. No pleural effusion or thickening appreciated.  HRCT 6/13 > Similar to prior exam favored to represent NSIP, 13m endobronchial nodule in proximal bronchus intermedius. Stable RLL subpleural nodule.   LABS 07/14/15 ANA: Negative Centromere Ab Screen:  <1.0 Anti-CCP:  >250 RF:  <10 Smith Ab:  <1.0 DS DNA Ab:  <1 Jo-1 Ab:  <1.0 SSA:  <1.0 SSB:  <1.0 SCL-70:  <1.0 RNP:  <1.0  10/30/15 ESR: 38 CRP: 14.9 ANA: RF: Anti-CCP :   Legionella antigen 6/12 >  Streptococcal antigen 6/12 > Sputum Cx 6/12 > Sputum AFB 6/12>    HISTORY OF PRESENT ILLNESS:  As per the patient's electronic medical record she reported progressive dyspnea for the past 4-5 years. The patient reported to the admitting physician that she had a cough for the past 4-5 months with increasing mucus production that was qualified as "greenish". Patient also endorsed increased fatigue. Patient had also reported increasing dyspnea. Patient was subsequently sent to the emergency department by his primary care physician today due to his increasing dyspnea and reportedly abnormal pulmonary exam. Patient was subsequently admitted by the Hospitalist Service and placed on Azithromycin & Rocephin for empiric antibiotic coverage. Patient confirms that he "can't get breath" and this seems to be progressively worsening over the last 3-4 months. He reports he does fatigue easily due to dyspnea with exertion. He also endorses ongoing wheezing. He reports progressively worsening cough productive of a "yellow, brown" phlegm. He denies any subjective fever, chills, or sweats. He denies any rashes or abnormal bruising. He does report some mild swelling and pain in his right great toe but denies any other joint swelling, erythema, or stiffness. He denies any  reflux or morning brash water taste. He denies any odynophagia but does admit to  occasional dysphagia.  SUBJECTIVE: feels about the same today, wants to be well enough to mow his yard.  VITAL SIGNS: Temp:  [97.7 F (36.5 C)-98.6 F (37 C)] 98.6 F (37 C) (06/13 1139) Pulse Rate:  [59-88] 59 (06/13 1139) Resp:  [16-20] 18 (06/13 1139) BP: (131-161)/(41-63) 131/44 mmHg (06/13 1139) SpO2:  [91 %-98 %] 96 % (06/13 1139) Weight:  [84.596 kg (186 lb 8 oz)-85.276 kg (188 lb)] 84.596 kg (186 lb 8 oz) (06/13 0143)  PHYSICAL EXAMINATION: General:  Awake. Alert. No acute distress. Mild central obesity.  Integument:  Warm & dry. No rash on exposed skin.  Lymphatics:  No appreciated cervical or supraclavicular lymphadenoapthy. HEENT:  Moist mucus membranes Cardiovascular:  Regular rate. No edema. No appreciable JVD given body habitus.  Pulmonary:  minimal work of breathing on room air. Speaking in complete sentences. Clear breath sounds Abdomen: Soft. Normal bowel sounds. Protuberant. Grossly nontender. Musculoskeletal:  Normal bulk and tone. Hand grip strength 5/5 bilaterally. No joint deformity or effusion appreciated. Neurological: Moving all 4 extremities equally.    Recent Labs Lab 10/30/15 1739 10/31/15 0538  NA 137 137  K 5.0 4.7  CL 100* 101  CO2 27 30  BUN 37* 45*  CREATININE 2.37* 2.44*  GLUCOSE 164* 178*    Recent Labs Lab 10/30/15 1739 10/31/15 0538  HGB 13.5 11.3*  HCT 42.6 36.0*  WBC 17.5* 13.0*  PLT 210 181   Dg Chest 2 View  10/30/2015  CLINICAL DATA:  Shortness of breath for 2 months EXAM: CHEST  2 VIEW COMPARISON:  06/12/2015 FINDINGS: Cardiac shadow is within normal limits. Lungs are clear bilaterally. Chronic changes are noted in the thoracic spine. No acute abnormality seen. IMPRESSION: No acute abnormality noted. Electronically Signed   By: Inez Catalina M.D.   On: 10/30/2015 16:01   Dg Esophagus  10/31/2015  CLINICAL DATA:  82 year old with occasional dysphagia. History of gastroesophageal reflux disease and shortness of breath. EXAM:  ESOPHOGRAM/BARIUM SWALLOW TECHNIQUE: Single contrast examination was performed using  thin barium. FLUOROSCOPY TIME:  Radiation Exposure Index (as provided by the fluoroscopic device): 190.65 uGy*m2 If the device does not provide the exposure index: Fluoroscopy Time:  1 minute and 18 seconds of pulsed fluoro Number of Acquired Images:  Fluoro only COMPARISON:  Chest CT 10/31/2015 FINDINGS: Study was performed in the erect and prone positions. The patient swallowed the barium without difficulty. Rapid sequence imaging of the pharynx in the lateral and AP projections demonstrates no mucosal lesions or laryngeal penetration. There is mild esophageal dysmotility with a decreased primary stripping wave and increased tertiary contractions. No evidence of esophageal mass, stricture or ulceration. No significant hiatal hernia or reflux demonstrated. Water siphon test not performed as patient could not lie supine. A 13 mm barium tablet was administered and passed without delay into the stomach. IMPRESSION: Mild presbyesophagus.  No anatomic abnormalities identified. Electronically Signed   By: Richardean Sale M.D.   On: 10/31/2015 11:18   Ct Chest High Resolution  10/31/2015  CLINICAL DATA:  82 year old male with progressively worsening cough and shortness of breath. Evaluate for potential nonspecific interstitial pneumonia (NSIP) versus amiodarone induced lung injury. EXAM: CT CHEST WITHOUT CONTRAST TECHNIQUE: Multidetector CT imaging of the chest was performed following the standard protocol without intravenous contrast. High resolution imaging of the lungs, as well as inspiratory and expiratory imaging, was performed. COMPARISON:  None. FINDINGS: Mediastinum/Lymph Nodes:  Heart size is mildly enlarged. There is no significant pericardial fluid, thickening or pericardial calcification. There is atherosclerosis of the thoracic aorta, the great vessels of the mediastinum and the coronary arteries, including calcified  atherosclerotic plaque in the left main, left anterior descending than left circumflex coronary arteries. No pathologically enlarged mediastinal or hilar lymph nodes. Please note that accurate exclusion of hilar adenopathy is limited on noncontrast CT scans. Small hiatal hernia. Lungs/Pleura: High-resolution images demonstrate patchy areas of ground-glass attenuation and mild septal thickening, most evident in the lung bases. There are no areas of significant traction bronchiectasis or frank honeycombing. Inspiratory and expiratory imaging demonstrates some mild air trapping, indicative of small airways disease. No acute consolidative airspace disease. No pleural effusions. 7 x 5 mm (mean diameter 6 mm) subpleural nodule in the periphery of the right lower lobe (image 65 of series 5) is unchanged. There is a new elongated pleural-based density in the medial aspect of the left upper lobe (image 19 of series 5), which is presumably a focal area of atelectasis or developing scarring. Small amount of dependent atelectasis is also noted in the lower lobes of the lungs bilaterally. As noted on the prior examination, there is a small nodular prominence along the lateral wall of the proximal bronchus intermedius measuring approximately 6 mm (axial image 56 of series 5 and coronal image 53 of series 6). Upper abdomen: Status post cholecystectomy. Extensive atherosclerosis. Musculoskeletal: Orthopedic fixation hardware in the lower cervical spine incidentally noted. There are no aggressive appearing lytic or blastic lesions noted in the visualized portions of the skeleton. IMPRESSION: 1. The appearance of the lungs appears very similar to the prior examination and is favored to reflect mild nonspecific interstitial pneumonia (NSIP). 2. 6 mm endobronchial nodule associated with the lateral margin of the proximal bronchus intermedius, similar to the prior study. This could represent an endobronchial polyp or other neoplasm.  Correlation with bronchoscopy for direct inspection and potential biopsy is suggested if clinically appropriate. 3. Stable subpleural nodule with mean diameter of 6 mm in the periphery of the right lower lobe, favored to be a benign subpleural lymph node. Repeat CT at 18-24 months (from scan dated 06/12/2015) is considered optional for low-risk patients, but is recommended for high-risk patients. This recommendation follows the consensus statement: Guidelines for Management of Incidental Pulmonary Nodules Detected on CT Images:From the Fleischner Society 2017; published online before print (10.1148/[id number]). 4. Atherosclerosis, including left main and 2 vessel coronary artery disease. 5. Small hiatal hernia. 6. Mild cardiomegaly. 7. Additional incidental findings, as above. Electronically Signed   By: Vinnie Langton M.D.   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 Reviewing the patient's previous autoimmune workup shows a significantly elevated anti-CCP but negative ANA and rheumatoid factor. The patient has no physical exam findings or joint complaints that would fit with an autoimmune process. However, his ongoing and worsening dyspnea, cough, and wheezing are perplexing. He has no signs of volume overload that would suggest decompensated diastolic congestive heart failure and his serum BNP while elevated is only mildly so and significantly less than his previous hospitalization with decompensated diastolic congestive heart failure. With his elevated white blood cell count there is suspicion about an infectious process contributing to his cough. Reviewing the patient's prior pulmonary function testing from December shows no evidence for airway dysfunction to suggest either asthma or COPD. Further imaging, autoimmune serologies,  and infectious workup is necessary to better  characterize this patient's chronic and seemingly worsening respiratory status.  Acute Bronchitis suspected: - empiric Rocephin & azithromycin for now.  - Follow cultures as above - Follow PCT  ILD:  -Follow, ANA, rheumatoid factor, and anti-CCP.  -Holding on initiating immunosuppression/steroids at this time. -Outpatient pulmonary follow up  GERD/ Dysphagia: - Continue PPI - barium swallow with mild presbyesophagus   Georgann Housekeeper, AGACNP-BC [facility] Pager [phone] or [phone] 10/31/2015 12:18 PM   Attending Note:  I have examined patient, reviewed labs, studies and notes. I have discussed the case with Jaclynn Guarneri, and I agree with the data and plans as amended above. Insidious progressive dyspnea and productive cough with interstitial changes on CT, elevated CRP and a-CCP. Also note apparent endobronchial lesion in the BI on his CT chest. Agree with auto-immune labs, empiric rx for possible bronchitis. Will need to also consider outpatient bronchoscopy for cx data and also to inspect the bronchus intermedius. I will discuss with Dr Vaughan Browner.  Baltazar Apo, MD, PhD 10/31/2015, 3:17 PM [facility] [phone] or if no answer [phone]

## 2015-11-01 DIAGNOSIS — I5032 Chronic diastolic (congestive) heart failure: Secondary | ICD-10-CM | POA: Diagnosis not present

## 2015-11-01 DIAGNOSIS — J9601 Acute respiratory failure with hypoxia: Secondary | ICD-10-CM

## 2015-11-01 DIAGNOSIS — J40 Bronchitis, not specified as acute or chronic: Secondary | ICD-10-CM | POA: Diagnosis not present

## 2015-11-01 DIAGNOSIS — N183 Chronic kidney disease, stage 3 (moderate): Secondary | ICD-10-CM | POA: Diagnosis not present

## 2015-11-01 LAB — HEMOGLOBIN A1C
Hgb A1c MFr Bld: 6.3 % — ABNORMAL HIGH (ref 4.8–5.6)
Mean Plasma Glucose: 134 mg/dL

## 2015-11-01 LAB — CBC
HCT: 39.8 % (ref 39.0–52.0)
HEMOGLOBIN: 12.5 g/dL — AB (ref 13.0–17.0)
MCH: 29.4 pg (ref 26.0–34.0)
MCHC: 31.4 g/dL (ref 30.0–36.0)
MCV: 93.6 fL (ref 78.0–100.0)
Platelets: 225 10*3/uL (ref 150–400)
RBC: 4.25 MIL/uL (ref 4.22–5.81)
RDW: 15.4 % (ref 11.5–15.5)
WBC: 14.5 10*3/uL — ABNORMAL HIGH (ref 4.0–10.5)

## 2015-11-01 LAB — PROTIME-INR
INR: 1.99 — ABNORMAL HIGH (ref 0.00–1.49)
PROTHROMBIN TIME: 22.5 s — AB (ref 11.6–15.2)

## 2015-11-01 LAB — BASIC METABOLIC PANEL
Anion gap: 6 (ref 5–15)
BUN: 45 mg/dL — ABNORMAL HIGH (ref 6–20)
CALCIUM: 8.6 mg/dL — AB (ref 8.9–10.3)
CO2: 28 mmol/L (ref 22–32)
Chloride: 103 mmol/L (ref 101–111)
Creatinine, Ser: 2.16 mg/dL — ABNORMAL HIGH (ref 0.61–1.24)
GFR, EST AFRICAN AMERICAN: 31 mL/min — AB (ref >60)
GFR, EST NON AFRICAN AMERICAN: 27 mL/min — AB (ref >60)
Glucose, Bld: 292 mg/dL — ABNORMAL HIGH (ref 65–99)
Potassium: 4.3 mmol/L (ref 3.5–5.1)
SODIUM: 137 mmol/L (ref 135–145)

## 2015-11-01 LAB — GLUCOSE, CAPILLARY
GLUCOSE-CAPILLARY: 44 mg/dL — AB (ref 65–99)
GLUCOSE-CAPILLARY: 47 mg/dL — AB (ref 65–99)
GLUCOSE-CAPILLARY: 51 mg/dL — AB (ref 65–99)
GLUCOSE-CAPILLARY: 53 mg/dL — AB (ref 65–99)
Glucose-Capillary: 40 mg/dL — CL (ref 65–99)
Glucose-Capillary: 85 mg/dL (ref 65–99)
Glucose-Capillary: 206 mg/dL — ABNORMAL HIGH (ref 65–99)
Glucose-Capillary: 93 mg/dL (ref 65–99)
Glucose-Capillary: 163 mg/dL — ABNORMAL HIGH (ref 65–99)

## 2015-11-01 LAB — LEGIONELLA PNEUMOPHILA TOTAL AB: Legionella Pneumo Total Ab: <0.91 ratio (ref 0.00–0.90)

## 2015-11-01 LAB — PROCALCITONIN: Procalcitonin: 0.43 ng/mL

## 2015-11-01 LAB — RHEUMATOID FACTOR: Rhuematoid fact SerPl-aCnc: 16.4 IU/mL — ABNORMAL HIGH (ref 0.0–13.9)

## 2015-11-01 LAB — CYCLIC CITRUL PEPTIDE ANTIBODY, IGG/IGA: CCP Antibodies IgG/IgA: 107 U — ABNORMAL HIGH (ref 0–19)

## 2015-11-01 LAB — ANTINUCLEAR ANTIBODIES, IFA: ANA Ab, IFA: NEGATIVE

## 2015-11-01 MED ORDER — WARFARIN SODIUM 3 MG PO TABS
3.0000 mg | ORAL_TABLET | Freq: Once | ORAL | Status: AC
  Administered 2015-11-01: 3 mg via ORAL
  Filled 2015-11-01: qty 1

## 2015-11-01 NOTE — Progress Notes (Signed)
Patient ID: JADYEL RATTLER, male   DOB: Jan 28, 1932, 82 y.o.   MRN: [id number]   PROGRESS NOTE    RULON FREITAG  [id number] DOB: 12/29/1931 DOA: 10/30/2015  PCP: Irven Shelling, MD   Brief Narrative:  82 year old male with atrial fibrillation, diastolic congestive heart failure, & chronic renal failure, presented to [facility] ED for evaluation of several months duration of progressively worsening dyspnea, cough productive of greenish sputum, malaise.   Assessment & Plan:   Dyspnea secondary to ? Acute Bronchitis - pt reports persistent dyspnea - PCCM consulted  - continue Zithromax and Rocephin day #3 - Follow cultures  ILD - per PCCM, will follow, ANA, rheumatoid factor, and anti-CCP.  - Holding on initiating immunosuppression/steroids at this time per PCCM   GERD/ Dysphagia, presbyesophagus  - Continue PPI  Acute on chronic kidney disease, stage III - Cr trending down  - monitor renal function   DM type II with complications of nephropathy - continue home regimen with insulin - added SSI  Atrial fib, CHADS 2 score 4 - continue Coumadin  - rate controlled  Obesity  - Body mass index is 30.12 kg/(m^2).  DVT prophylaxis: Coumadin  Code Status: Full  Family Communication: Patient at bedside  Disposition Plan: Home once cleared by PCCM   Consultants:   PCCM  Procedures and recent studies: TTE (03/02/15): LV normal in size with severe focal basal septal hypertrophy. EF 55-60%. Normal wall motion. Unable to assess diastolic dysfunction due to atrial fibrillation. LA & RA normal in size. RV normal in size and function. No aortic stenosis or regurgitation. Normal aortic root size. Trivial mitral regurgitation without stenosis. No pulmonic stenosis. Mild tricuspid regurgitation. Pulmonary artery normal in size. No pericardial effusion.  PFT (04/26/15): FVC 1.59 L (82%) FEV1 1.25 L (89%) FEV1/FVC 0.79 FEF 25-75 1.28 L (93%) negative bronchodilator response TLC 2.69 L  (45%) RV 55% ERV 38% DLCO uncorrected 53%  HRCT CHEST W/O 06/12/15 No pleural effusion or thickening. No pathologic mediastinal adenopathy or mediastinal mass. No pericardial effusion. 7 mm nodule in right lower lobe noted. No traction bronchiectasis or honeycomb changes. Predominantly dependent subpleural reticular changes and small amount of groundglass opacification. This appears more prevalent within the lower lung zones.  Antimicrobials:   Zithromax 6/12 -->  Rocephin 6/12 -->    Subjective: Still with dyspnea with exertion.   Objective: Filed Vitals:   10/31/15 0143 10/31/15 0521 10/31/15 1139 11/01/15 0608  BP: 137/63 144/42 131/44 147/60  Pulse: 88 61 59 69  Temp: 98.3 F (36.8 C) 98.1 F (36.7 C) 98.6 F (37 C) 99 F (37.2 C)  TempSrc: Oral Oral Oral Oral  Resp: 20 20 18 18   Height: 5\' 6"  (1.676 m)     Weight: 84.596 kg (186 lb 8 oz)   85.594 kg (188 lb 11.2 oz)  SpO2: 93% 94% 96% 95%    Intake/Output Summary (Last 24 hours) at 11/01/15 0650 Last data filed at 11/01/15 0609  Gross per 24 hour  Intake   1190 ml  Output    801 ml  Net    389 ml   Filed Weights   10/30/15 1534 10/31/15 0143 11/01/15 0608  Weight: 85.276 kg (188 lb) 84.596 kg (186 lb 8 oz) 85.594 kg (188 lb 11.2 oz)    Examination:  General exam: Appears calm and comfortable  Respiratory system: Clear to auscultation. Respiratory effort normal. Cardiovascular system: S1 & S2 heard, RRR. No JVD, murmurs, rubs, gallops or clicks. No pedal  edema. Gastrointestinal system: Abdomen is nondistended, soft and nontender. No organomegaly or masses felt.  Central nervous system: Alert and oriented. No focal neurological deficits.  Data Reviewed: I have personally reviewed following labs and imaging studies  CBC:  Recent Labs Lab 10/30/15 1739 10/31/15 0538 11/01/15 0327  WBC 17.5* 13.0* 14.5*  NEUTROABS  --  10.4*  --   HGB 13.5 11.3* 12.5*  HCT 42.6 36.0* 39.8  MCV 92.8 92.1 93.6  PLT 210  181 [id number]   Basic Metabolic Panel:  Recent Labs Lab 10/30/15 1739 10/31/15 0538  NA 137 137  K 5.0 4.7  CL 100* 101  CO2 27 30  GLUCOSE 164* 178*  BUN 37* 45*  CREATININE 2.37* 2.44*  CALCIUM 9.4 8.7*   Liver Function Tests:  Recent Labs Lab 10/31/15 0538  AST 23  ALT 20  ALKPHOS 80  BILITOT 0.5  PROT 5.2*  ALBUMIN 2.6*   Coagulation Profile:  Recent Labs Lab 10/30/15 1956 10/31/15 0538 11/01/15 0327  INR 2.16* 2.27* 1.99*   Cardiac Enzymes:  Recent Labs Lab 10/30/15 2355 10/31/15 0536 10/31/15 1156  TROPONINI 0.04* 0.05* 0.05*   CBG:  Recent Labs Lab 10/31/15 2126 11/01/15 0416 11/01/15 0444 11/01/15 0447 11/01/15 0519  GLUCAP 117* 40* 44* 47* 85   Urine analysis:    Component Value Date/Time   COLORURINE YELLOW 10/31/2015 0700   APPEARANCEUR CLEAR 10/31/2015 0700   LABSPEC 1.021 10/31/2015 0700   PHURINE 5.0 10/31/2015 0700   GLUCOSEU NEGATIVE 10/31/2015 0700   HGBUR NEGATIVE 10/31/2015 0700   BILIRUBINUR NEGATIVE 10/31/2015 0700   KETONESUR NEGATIVE 10/31/2015 0700   PROTEINUR 100* 10/31/2015 0700   UROBILINOGEN 0.2 05/11/2013 1632   NITRITE NEGATIVE 10/31/2015 0700   LEUKOCYTESUR NEGATIVE 10/31/2015 0700    Radiology Studies: Dg Chest 2 View  10/30/2015  CLINICAL DATA:  Shortness of breath for 2 months EXAM: CHEST  2 VIEW COMPARISON:  06/12/2015 FINDINGS: Cardiac shadow is within normal limits. Lungs are clear bilaterally. Chronic changes are noted in the thoracic spine. No acute abnormality seen. IMPRESSION: No acute abnormality noted. Electronically Signed   By: Inez Catalina M.D.   On: 10/30/2015 16:01   Dg Esophagus  10/31/2015  CLINICAL DATA:  82 year old with occasional dysphagia. History of gastroesophageal reflux disease and shortness of breath. EXAM: ESOPHOGRAM/BARIUM SWALLOW TECHNIQUE: Single contrast examination was performed using  thin barium. FLUOROSCOPY TIME:  Radiation Exposure Index (as provided by the fluoroscopic  device): 190.65 uGy*m2 If the device does not provide the exposure index: Fluoroscopy Time:  1 minute and 18 seconds of pulsed fluoro Number of Acquired Images:  Fluoro only COMPARISON:  Chest CT 10/31/2015 FINDINGS: Study was performed in the erect and prone positions. The patient swallowed the barium without difficulty. Rapid sequence imaging of the pharynx in the lateral and AP projections demonstrates no mucosal lesions or laryngeal penetration. There is mild esophageal dysmotility with a decreased primary stripping wave and increased tertiary contractions. No evidence of esophageal mass, stricture or ulceration. No significant hiatal hernia or reflux demonstrated. Water siphon test not performed as patient could not lie supine. A 13 mm barium tablet was administered and passed without delay into the stomach. IMPRESSION: Mild presbyesophagus.  No anatomic abnormalities identified. Electronically Signed   By: Richardean Sale M.D.   On: 10/31/2015 11:18   Ct Chest High Resolution  10/31/2015  CLINICAL DATA:  82 year old male with progressively worsening cough and shortness of breath. Evaluate for potential nonspecific interstitial pneumonia (NSIP) versus amiodarone  induced lung injury. EXAM: CT CHEST WITHOUT CONTRAST TECHNIQUE: Multidetector CT imaging of the chest was performed following the standard protocol without intravenous contrast. High resolution imaging of the lungs, as well as inspiratory and expiratory imaging, was performed. COMPARISON:  None. FINDINGS: Mediastinum/Lymph Nodes: Heart size is mildly enlarged. There is no significant pericardial fluid, thickening or pericardial calcification. There is atherosclerosis of the thoracic aorta, the great vessels of the mediastinum and the coronary arteries, including calcified atherosclerotic plaque in the left main, left anterior descending than left circumflex coronary arteries. No pathologically enlarged mediastinal or hilar lymph nodes. Please note  that accurate exclusion of hilar adenopathy is limited on noncontrast CT scans. Small hiatal hernia. Lungs/Pleura: High-resolution images demonstrate patchy areas of ground-glass attenuation and mild septal thickening, most evident in the lung bases. There are no areas of significant traction bronchiectasis or frank honeycombing. Inspiratory and expiratory imaging demonstrates some mild air trapping, indicative of small airways disease. No acute consolidative airspace disease. No pleural effusions. 7 x 5 mm (mean diameter 6 mm) subpleural nodule in the periphery of the right lower lobe (image 65 of series 5) is unchanged. There is a new elongated pleural-based density in the medial aspect of the left upper lobe (image 19 of series 5), which is presumably a focal area of atelectasis or developing scarring. Small amount of dependent atelectasis is also noted in the lower lobes of the lungs bilaterally. As noted on the prior examination, there is a small nodular prominence along the lateral wall of the proximal bronchus intermedius measuring approximately 6 mm (axial image 56 of series 5 and coronal image 53 of series 6). Upper abdomen: Status post cholecystectomy. Extensive atherosclerosis. Musculoskeletal: Orthopedic fixation hardware in the lower cervical spine incidentally noted. There are no aggressive appearing lytic or blastic lesions noted in the visualized portions of the skeleton. IMPRESSION: 1. The appearance of the lungs appears very similar to the prior examination and is favored to reflect mild nonspecific interstitial pneumonia (NSIP). 2. 6 mm endobronchial nodule associated with the lateral margin of the proximal bronchus intermedius, similar to the prior study. This could represent an endobronchial polyp or other neoplasm. Correlation with bronchoscopy for direct inspection and potential biopsy is suggested if clinically appropriate. 3. Stable subpleural nodule with mean diameter of 6 mm in the  periphery of the right lower lobe, favored to be a benign subpleural lymph node. Repeat CT at 18-24 months (from scan dated 06/12/2015) is considered optional for low-risk patients, but is recommended for high-risk patients. This recommendation follows the consensus statement: Guidelines for Management of Incidental Pulmonary Nodules Detected on CT Images:From the Fleischner Society 2017; published online before print (10.1148/radiol.[id number]). 4. Atherosclerosis, including left main and 2 vessel coronary artery disease. 5. Small hiatal hernia. 6. Mild cardiomegaly. 7. Additional incidental findings, as above. Electronically Signed   By: Vinnie Langton M.D.   On: 10/31/2015 08:55      Scheduled Meds: . atorvastatin  40 mg Oral Daily  . azithromycin  500 mg Intravenous Q24H  . cefTRIAXone (ROCEPHIN)  IV  1 g Intravenous Q24H  . insulin aspart  0-15 Units Subcutaneous TID WC  . insulin aspart  0-5 Units Subcutaneous QHS  . insulin detemir  35 Units Subcutaneous Daily   And  . insulin detemir  25 Units Subcutaneous QHS  . metoprolol  50 mg Oral BID  . pantoprazole  40 mg Oral Daily  . sodium chloride flush  3 mL Intravenous Q12H  . Warfarin - Pharmacist  Dosing Inpatient   Does not apply q1800   Continuous Infusions:    Time spent: 20 minutes   Faye Ramsay, MD [facility] Pager [phone]  If 7PM-7AM, please contact night-coverage [email or web address] Password [email or web address] 11/01/2015, 6:50 AM

## 2015-11-01 NOTE — Progress Notes (Signed)
CBG was 51 before dinner. Patient given cup of orange juice and meal tray. After 20 minutes, CBG was 53. Another cup of orange juice given. CBG is now 93. MD notified. Will continue to monitor.

## 2015-11-01 NOTE — Progress Notes (Signed)
ANTICOAGULATION CONSULT NOTE -Follow up  Pharmacy Consult for Coumadin Indication: atrial fibrillation  Allergies  Allergen Reactions  . 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 Patient subsequently taken off amiodarone.  . Nifedipine Other (See Comments)    Reaction: made gums swell up. Pt states that he had to have gum surgery after taking.     Patient Measurements: Height: 5\' 6"  (167.6 cm) Weight: 188 lb 11.2 oz (85.594 kg) (c scale) IBW/kg (Calculated) : 63.8  Vital Signs: Temp: 98.3 F (36.8 C) (06/14 1149) Temp Source: Oral (06/14 1149) BP: 174/47 mmHg (06/14 1149) Pulse Rate: 55 (06/14 1149)  Labs:  Recent Labs  10/30/15 1739 10/30/15 1956 10/30/15 2355 10/31/15 0536 10/31/15 0538 10/31/15 1156 11/01/15 0327 11/01/15 0846  HGB 13.5  --   --   --  11.3*  --  12.5*  --   HCT 42.6  --   --   --  36.0*  --  39.8  --   PLT 210  --   --   --  181  --  225  --   LABPROT  --  23.9*  --   --  24.8*  --  22.5*  --   INR  --  2.16*  --   --  2.27*  --  1.99*  --   CREATININE 2.37*  --   --   --  2.44*  --   --  2.16*  TROPONINI  --   --  0.04* 0.05*  --  0.05*  --   --     Estimated Creatinine Clearance: 27.5 mL/min (by C-G formula based on Cr of 2.16).   Medical History: Past Medical History  Diagnosis Date  . Essential hypertension   . Type II diabetes mellitus ([place])   . Pneumonia 2007  . Cervical disc disease   . Dyslipidemia   . CKD (chronic kidney disease), stage III   . GERD (gastroesophageal reflux disease)   . PAF (paroxysmal atrial fibrillation) ([place])     a. Dx 02/2015 - in and out on tele, rx'd Coumadin and amiodarone.  . Chronic diastolic CHF (congestive heart failure) ([place])     a. Dx 02/2015 -  2D echo 03/02/15 showed severe focal basal hypertrophy of the septum, EF 55-60%, mild MR, no effusion.  . Coronary artery calcification seen on CT scan      a. Nuc 02/2015 was normal.  . Bradycardia     a. during 02/2015 admission - occasional HR in 40s while being treated for atrial fib.  . 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   Current Outpatient Prescriptions on File Prior to Encounter  Medication Sig Dispense Refill  . atorvastatin (LIPITOR) 80 MG tablet Take 40 mg by mouth daily.  2  . Cholecalciferol (VITAMIN D-3) 1000 UNITS CAPS Take 1 capsule by mouth daily.    . furosemide (LASIX) 40 MG tablet Take 40 mg by mouth daily.    Marland Kitchen lisinopril (PRINIVIL,ZESTRIL) 10 MG tablet Take 1 tablet (10 mg total) by mouth daily.    . metoprolol (LOPRESSOR) 50 MG tablet Take 50 mg by mouth 2 (two) times daily.    Marland Kitchen omeprazole (PRILOSEC) 20 MG capsule Take 20 mg by mouth daily.     . vitamin  B-12 (CYANOCOBALAMIN) 1000 MCG tablet Take 1,000 mcg by mouth daily.    . insulin detemir (LEVEMIR) 100 UNIT/ML injection Inject 0.1 mLs (10 Units total) into the skin at bedtime. (Patient taking differently: Inject 25-35 Units into the skin See admin instructions. 35 units in the morning and 25 units at night) 10 mL 11   Assessment: 82 y.o. male admitted 10/30/15 with SOB/acute bronchitis, h/o Afib, to continue Coumadin.  PTA:  Warfarin 2mg  prescription (per [facility]) last filled on 10/18/15.  Instructions were to take 3mg  qMWF and 2mg  qTTSS.  However, the patient reported that he takes 3mg  daily and last taken PTA on 10/29/15.   INR = 1.99 today.  INR decreased to below goal of 2-3, likely due to no coumadin given on admit date 6/12  INR trend since admit date 6/12: INR 2.16>2.27>1.99.  CBC stable. No bleeding noted.  Day #3 Azithromycin- which can increase the coumadin effect.    Goal of Therapy:  INR 2-3 Monitor platelets by anticoagulation protocol: Yes   Plan:  Give Coumadin 3 mg today x1 Daily INR Watch for  drug interaction with azithromycin  Nicole Cella, RPh Clinical Pharmacist Pager: [phone] 11/01/2015 12:16 PM

## 2015-11-01 NOTE — Progress Notes (Signed)
Pt's blood sugar is 40 around 4.30am, encouraged to drink juice and pt used cracker with peanut butter too. After 15 min it was just 47 again, called lab to verify and will check again. According to patient "he is dealing with Diabetes since 40 years and it is wrong value, it supposed to be more than 100" will recheck again, pt is asymptomatic

## 2015-11-01 NOTE — Progress Notes (Signed)
Inpatient Diabetes Program Recommendations  AACE/ADA: New Consensus Statement on Inpatient Glycemic Control (2015)  Target Ranges:  Prepandial:   less than 140 mg/dL      Peak postprandial:   less than 180 mg/dL (1-2 hours)      Critically ill patients:  140 - 180 mg/dL   Lab Results  Component Value Date   GLUCAP 85 11/01/2015   HGBA1C 6.3* 10/31/2015    Review of Glycemic Control  Inpatient Diabetes Program Recommendations:  Insulin - Basal: Discontinue Levemir 35 units  Pt with severe hypoglycemia this morning. Ordered Levemir 35 units am and 25 units pm.   May need addition of Novolog meal coverage if postprandials are elevated. Thank you  Raoul Pitch MSN, RN,CDE Inpatient Diabetes Coordinator [phone] (team pager)

## 2015-11-01 NOTE — Progress Notes (Signed)
Name: Justin Leach MRN: [id number] DOB: 04/01/1932    ADMISSION DATE:  10/30/2015 CONSULTATION DATE:  10/30/2015  REFERRING MD :  Toy Baker, M.D. / [facility]  CHIEF COMPLAINT:  Acute Bronchitis w/ Possible ILD Flare  BRIEF PATIENT DESCRIPTION: 82 year old male followed by Dr. Vaughan Browner. Admitted October 2016 with new onset atrial fibrillation, diastolic congestive heart failure, & acute on chronic renal failure. Placed on amiodarone at that time. Chest imaging in January showed new findings concerning for amiodaroollowed by Dr. Vaughan Browner. Admitted October 2016 with new onset atrial fibrillation, diastolic congestive heart failure, & acute on chne toxicity versus NSIP. Patient subsequently taken off amiodarone.   SIGNIFICANT EVENTS  6/12 - Admit  STUDIES:  TTE (03/02/15): LV normal in size with severe focal basal septal hypertrophy. EF 55-60%. Normal wall motion. Unable to assess diastolic dysfunction due to atrial fibrillation. LA & RA normal in size. RV normal in size and function. No aortic stenosis or regurgitation. Normal aortic root size. Trivial mitral regurgitation without stenosis. No pulmonic stenosis. Mild tricuspid regurgitation. Pulmonary artery normal in size. No pericardial effusion.  PFT (04/26/15): FVC 1.59 L (82%) FEV1 1.25 L (89%) FEV1/FVC 0.79 FEF 25-75 1.28 L (93%) negative bronchodilator response TLC 2.69 L (45%) RV 55% ERV 38% DLCO uncorrected 53%  HRCT CHEST W/O 06/12/15 (personally reviewed by me): No pleural effusion or thickening. No pathologic mediastinal adenopathy or mediastinal mass. No pericardial effusion. 7 mm nodule in right lower lobe noted. No traction bronchiectasis or honeycomb changes. Predominantly dependent subpleural reticular changes and small amount of groundglass opacification. This appears more prevalent within the lower lung zones.  CXR PA/LAT 10/30/15 (personally reviewed by me): Heart borderline normal in size. Mediastinum normal in contour.  Slightly low lung volumes with kyphosis noted on lateral view. No parenchymal opacity or mass appreciated. No pleural effusion or thickening appreciated.  HRCT 6/13 > Similar to prior exam favored to represent NSIP, 31m endobronchial nodule in proximal bronchus intermedius. Stable RLL subpleural nodule.   LABS 07/14/15 ANA: Negative Centromere Ab Screen:  <1.0 Anti-CCP:  >250 RF:  <10 Smith Ab:  <1.0 DS DNA Ab:  <1 Jo-1 Ab:  <1.0 SSA:  <1.0 SSB:  <1.0 SCL-70:  <1.0 RNP:  <1.0  10/30/15 ESR: 38 CRP: 14.9 ANA: RF: Anti-CCP :   Legionella antigen 6/12 >  Streptococcal antigen 6/12 > Sputum Cx 6/12 > Sputum AFB 6/12>  HISTORY OF PRESENT ILLNESS:  As per the patient's electronic medical record she reported progressive dyspnea for the past 4-5 years. The patient reported to the admitting physician that she had a cough for the past 4-5 months with increasing mucus production that was qualified as "greenish". Patient also endorsed increased fatigue. Patient had also reported increasing dyspnea. Patient was subsequently sent to the emergency department by his primary care physician today due to his increasing dyspnea and reportedly abnormal pulmonary exam. Patient was subsequently admitted by the Hospitalist Service and placed on Azithromycin & Rocephin for empiric antibiotic coverage. Patient confirms that he "can't get breath" and this seems to be progressively worsening over the last 3-4 months. He reports he does fatigue easily due to dyspnea with exertion. He also endorses ongoing wheezing. He reports progressively worsening cough productive of a "yellow, brown" phlegm. He denies any subjective fever, chills, or sweats. He denies any rashes or abnormal bruising. He does report some mild swelling and pain in his right great toe but denies any other joint swelling, erythema, or stiffness. He denies any reflux or  morning brash water taste. He denies any odynophagia but does admit to occasional  dysphagia.  SUBJECTIVE: pt still having dyspnea with activity denies dyspnea at rest.  States he now has to use a cane at home to ambulate.  Denies chest pain, wheezing, nausea, or vomiting  VITAL SIGNS: Temp:  [98.3 F (36.8 C)-99 F (37.2 C)] 98.3 F (36.8 C) (06/14 1149) Pulse Rate:  [55-69] 55 (06/14 1149) Resp:  [18] 18 (06/14 1149) BP: (147-174)/(47-60) 174/47 mmHg (06/14 1149) SpO2:  [95 %-97 %] 97 % (06/14 1149) Weight:  [188 lb 11.2 oz (85.594 kg)] 188 lb 11.2 oz (85.594 kg) (06/14 3435)  PHYSICAL EXAMINATION: General:  Awake. Alert. No acute distress. Mild central obesity.  Integument:  Warm & dry. No rash on exposed skin.  HEENT:  Moist mucus membranes, supple Cardiovascular:  S1s2, rrrr, No edema. No appreciable JVD given body habitus.  Pulmonary:  minimal work of breathing on room air. Speaking in complete sentences. Clear breath sounds throughout Abdomen: Soft. Normal bowel sounds. Protuberant. Grossly nontender. Musculoskeletal:  Normal bulk and tone. No joint deformity or effusion appreciated. Neurological: Moving all 4 extremities equally.    Recent Labs Lab 10/30/15 1739 10/31/15 0538 11/01/15 0846  NA 137 137 137  K 5.0 4.7 4.3  CL 100* 101 103  CO2 _0 BUN 37* 45* 45*  CREATININE 2.37* 2.44* 2.16*  GLUCOSE 164* 178* 292*    Recent Labs Lab 10/30/15 1739 10/31/15 0538 11/01/15 0327  HGB 13.5 11.3* 12.5*  HCT 42.6 36.0* 39.8  WBC 17.5* 13.0* 14.5*  PLT 210 181 225   Dg Chest 2 View  10/30/2015  CLINICAL DATA:  Shortness of breath for 2 months EXAM: CHEST  2 VIEW COMPARISON:  06/12/2015 FINDINGS: Cardiac shadow is within normal limits. Lungs are clear bilaterally. Chronic changes are noted in the thoracic spine. No acute abnormality seen. IMPRESSION: No acute abnormality noted. Electronically Signed   By: Inez Catalina M.D.   On: 10/30/2015 16:01   Dg Esophagus  10/31/2015  CLINICAL DATA:  82 year old with occasional dysphagia. History of  gastroesophageal reflux disease and shortness of breath. EXAM: ESOPHOGRAM/BARIUM SWALLOW TECHNIQUE: Single contrast examination was performed using  thin barium. FLUOROSCOPY TIME:  Radiation Exposure Index (as provided by the fluoroscopic device): 190.65 uGy*m2 If the device does not provide the exposure index: Fluoroscopy Time:  1 minute and 18 seconds of pulsed fluoro Number of Acquired Images:  Fluoro only COMPARISON:  Chest CT 10/31/2015 FINDINGS: Study was performed in the erect and prone positions. The patient swallowed the barium without difficulty. Rapid sequence imaging of the pharynx in the lateral and AP projections demonstrates no mucosal lesions or laryngeal penetration. There is mild esophageal dysmotility with a decreased primary stripping wave and increased tertiary contractions. No evidence of esophageal mass, stricture or ulceration. No significant hiatal hernia or reflux demonstrated. Water siphon test not performed as patient could not lie supine. A 13 mm barium tablet was administered and passed without delay into the stomach. IMPRESSION: Mild presbyesophagus.  No anatomic abnormalities identified. Electronically Signed   By: Richardean Sale M.D.   On: 10/31/2015 11:18   Ct Chest High Resolution  10/31/2015  CLINICAL DATA:  82 year old male with progressively worsening cough and shortness of breath. Evaluate for potential nonspecific interstitial pneumonia (NSIP) versus amiodarone induced lung injury. EXAM: CT CHEST WITHOUT CONTRAST TECHNIQUE: Multidetector CT imaging of the chest was performed following the standard protocol without intravenous contrast. High resolution imaging of the  lungs, as well as inspiratory and expiratory imaging, was performed. COMPARISON:  None. FINDINGS: Mediastinum/Lymph Nodes: Heart size is mildly enlarged. There is no significant pericardial fluid, thickening or pericardial calcification. There is atherosclerosis of the thoracic aorta, the great vessels of the  mediastinum and the coronary arteries, including calcified atherosclerotic plaque in the left main, left anterior descending than left circumflex coronary arteries. No pathologically enlarged mediastinal or hilar lymph nodes. Please note that accurate exclusion of hilar adenopathy is limited on noncontrast CT scans. Small hiatal hernia. Lungs/Pleura: High-resolution images demonstrate patchy areas of ground-glass attenuation and mild septal thickening, most evident in the lung bases. There are no areas of significant traction bronchiectasis or frank honeycombing. Inspiratory and expiratory imaging demonstrates some mild air trapping, indicative of small airways disease. No acute consolidative airspace disease. No pleural effusions. 7 x 5 mm (mean diameter 6 mm) subpleural nodule in the periphery of the right lower lobe (image 65 of series 5) is unchanged. There is a new elongated pleural-based density in the medial aspect of the left upper lobe (image 19 of series 5), which is presumably a focal area of atelectasis or developing scarring. Small amount of dependent atelectasis is also noted in the lower lobes of the lungs bilaterally. As noted on the prior examination, there is a small nodular prominence along the lateral wall of the proximal bronchus intermedius measuring approximately 6 mm (axial image 56 of series 5 and coronal image 53 of series 6). Upper abdomen: Status post cholecystectomy. Extensive atherosclerosis. Musculoskeletal: Orthopedic fixation hardware in the lower cervical spine incidentally noted. There are no aggressive appearing lytic or blastic lesions noted in the visualized portions of the skeleton. IMPRESSION: 1. The appearance of the lungs appears very similar to the prior examination and is favored to reflect mild nonspecific interstitial pneumonia (NSIP). 2. 6 mm endobronchial nodule associated with the lateral margin of the proximal bronchus intermedius, similar to the prior study. This  could represent an endobronchial polyp or other neoplasm. Correlation with bronchoscopy for direct inspection and potential biopsy is suggested if clinically appropriate. 3. Stable subpleural nodule with mean diameter of 6 mm in the periphery of the right lower lobe, favored to be a benign subpleural lymph node. Repeat CT at 18-24 months (from scan dated 06/12/2015) is considered optional for low-risk patients, but is recommended for high-risk patients. This recommendation follows the consensus statement: Guidelines for Management of Incidental Pulmonary Nodules Detected on CT Images:From the Fleischner Society 2017; published online before print (10.1148/[id number]). 4. Atherosclerosis, including left main and 2 vessel coronary artery disease. 5. Small hiatal hernia. 6. Mild cardiomegaly. 7. Additional incidental findings, as above. Electronically Signed   By: Vinnie Langton M.D.   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 Reviewing the patient's previous autoimmune workup shows a significantly elevated anti-CCP but negative ANA and rheumatoid factor. The patient has no physical exam findings or joint complaints that would fit with an autoimmune process. However, his ongoing and worsening dyspnea, cough, and wheezing are perplexing. He has no signs of volume overload that would suggest decompensated diastolic congestive heart failure and his serum BNP while elevated is only mildly so and significantly less than his previous hospitalization with decompensated diastolic congestive heart failure. With his elevated white blood cell count there is suspicion about an infectious process contributing to his cough. Reviewing the patient's prior pulmonary function testing from December  shows no evidence for airway dysfunction to suggest either asthma or COPD. Further imaging, autoimmune  serologies, and infectious workup is necessary to better characterize this patient's chronic and seemingly worsening respiratory status.  Acute Bronchitis suspected: - empiric Rocephin & azithromycin for now.  - Follow cultures as above - Follow PCT  ILD:  -Follow, ANA, rheumatoid factor, and anti-CCP.  -Holding on initiating immunosuppression/steroids at this time. -Outpatient pulmonary follow up  GERD/ Dysphagia: - Continue PPI - barium swallow with mild presbyesophagus   Marda Stalker, [place]   Attending Note:  I have examined patient, reviewed labs, studies and notes. I have discussed the case with D Blakeney , and I agree with the data and plans as amended above. I believe he needs ambulatory oximetry on room air to assess for desaturation.    Baltazar Apo, MD, PhD 11/01/2015, 3:55 PM [facility] [phone] or if no answer [phone]

## 2015-11-02 DIAGNOSIS — J8410 Pulmonary fibrosis, unspecified: Secondary | ICD-10-CM | POA: Diagnosis present

## 2015-11-02 DIAGNOSIS — J849 Interstitial pulmonary disease, unspecified: Secondary | ICD-10-CM | POA: Diagnosis not present

## 2015-11-02 DIAGNOSIS — J40 Bronchitis, not specified as acute or chronic: Secondary | ICD-10-CM | POA: Diagnosis not present

## 2015-11-02 DIAGNOSIS — N183 Chronic kidney disease, stage 3 (moderate): Secondary | ICD-10-CM | POA: Diagnosis not present

## 2015-11-02 DIAGNOSIS — I10 Essential (primary) hypertension: Secondary | ICD-10-CM | POA: Diagnosis not present

## 2015-11-02 DIAGNOSIS — I480 Paroxysmal atrial fibrillation: Secondary | ICD-10-CM | POA: Diagnosis not present

## 2015-11-02 LAB — GLUCOSE, CAPILLARY
GLUCOSE-CAPILLARY: 38 mg/dL — AB (ref 65–99)
GLUCOSE-CAPILLARY: 57 mg/dL — AB (ref 65–99)
GLUCOSE-CAPILLARY: 82 mg/dL (ref 65–99)
GLUCOSE-CAPILLARY: 195 mg/dL — AB (ref 65–99)
GLUCOSE-CAPILLARY: 308 mg/dL — AB (ref 65–99)
GLUCOSE-CAPILLARY: 349 mg/dL — AB (ref 65–99)
Glucose-Capillary: 44 mg/dL — CL (ref 65–99)
Glucose-Capillary: 214 mg/dL — ABNORMAL HIGH (ref 65–99)

## 2015-11-02 LAB — PROTIME-INR
INR: 2.31 — ABNORMAL HIGH (ref 0.00–1.49)
Prothrombin Time: 25.1 seconds — ABNORMAL HIGH (ref 11.6–15.2)

## 2015-11-02 MED ORDER — PREDNISONE 20 MG PO TABS
40.0000 mg | ORAL_TABLET | Freq: Every day | ORAL | Status: DC
  Administered 2015-11-02 – 2015-11-03 (×2): 40 mg via ORAL
  Filled 2015-11-02 (×2): qty 2

## 2015-11-02 MED ORDER — HYDROCOD POLST-CPM POLST ER 10-8 MG/5ML PO SUER
5.0000 mL | Freq: Once | ORAL | Status: AC
  Administered 2015-11-02: 5 mL via ORAL
  Filled 2015-11-02: qty 5

## 2015-11-02 MED ORDER — WARFARIN SODIUM 2 MG PO TABS
2.0000 mg | ORAL_TABLET | Freq: Once | ORAL | Status: AC
  Administered 2015-11-02: 2 mg via ORAL
  Filled 2015-11-02: qty 1

## 2015-11-02 NOTE — Progress Notes (Signed)
Name: Justin Leach MRN: [id number] DOB: [birth year]    ADMISSION DATE:  10/30/2015 CONSULTATION DATE:  10/30/2015  REFERRING MD :  Toy Baker, M.D. / [facility]  CHIEF COMPLAINT:  Acute Bronchitis w/ Possible ILD Flare  BRIEF PATIENT DESCRIPTION: 82 year old male followed by Dr. Vaughan Browner. Admitted October 2016 with new onset atrial fibrillation, diastolic congestive heart failure, & acute on chronic renal failure. Placed on amiodarone at that time. Chest imaging in January showed new findings concerning for amiodaroollowed by Dr. Vaughan Browner. Admitted October 2016 with new onset atrial fibrillation, diastolic congestive heart failure, & acute on chne toxicity versus NSIP. Patient subsequently taken off amiodarone.   SIGNIFICANT EVENTS  6/12 - Admit  STUDIES:  TTE (03/02/15): LV normal in size with severe focal basal septal hypertrophy. EF 55-60%. Normal wall motion. Unable to assess diastolic dysfunction due to atrial fibrillation. LA & RA normal in size. RV normal in size and function. No aortic stenosis or regurgitation. Normal aortic root size. Trivial mitral regurgitation without stenosis. No pulmonic stenosis. Mild tricuspid regurgitation. Pulmonary artery normal in size. No pericardial effusion.  PFT (04/26/15): FVC 1.59 L (82%) FEV1 1.25 L (89%) FEV1/FVC 0.79 FEF 25-75 1.28 L (93%) negative bronchodilator response TLC 2.69 L (45%) RV 55% ERV 38% DLCO uncorrected 53%  HRCT CHEST W/O 06/12/15 (personally reviewed by me): No pleural effusion or thickening. No pathologic mediastinal adenopathy or mediastinal mass. No pericardial effusion. 7 mm nodule in right lower lobe noted. No traction bronchiectasis or honeycomb changes. Predominantly dependent subpleural reticular changes and small amount of groundglass opacification. This appears more prevalent within the lower lung zones.  HRCT 6/13 > Similar to prior exam favored to represent NSIP, 43m endobronchial nodule in proximal bronchus  intermedius. Stable RLL subpleural nodule.   LABS 07/14/15 ANA: Negative Centromere Ab Screen:  <1.0 Anti-CCP:  >250 RF:  <10 Smith Ab:  <1.0 DS DNA Ab:  <1 Jo-1 Ab:  <1.0 SSA:  <1.0 SSB:  <1.0 SCL-70:  <1.0 RNP:  <1.0  10/30/15 ESR: 38 CRP: 14.9 ANA: neg RF:pos Anti-CCP : 107  Legionella antigen 6/12 > neg Streptococcal antigen 6/12 >neg Sputum Cx 6/12 > Sputum AFB 6/12>  SUBJECTIVE:  No better pt still having dyspnea with activity denies dyspnea at rest.    Denies chest pain, wheezing, nausea, or vomiting  VITAL SIGNS: Temp:  [98 F (36.7 C)-98.4 F (36.9 C)] 98.4 F (36.9 C) (06/15 0551) Pulse Rate:  [51-61] 51 (06/15 0551) Resp:  [18-19] 19 (06/15 0551) BP: (167-176)/(42-57) 176/42 mmHg (06/15 0551) SpO2:  [93 %-97 %] 97 % (06/15 0551) Weight:  [182 lb 4.8 oz (82.691 kg)] 182 lb 4.8 oz (82.691 kg) (06/15 0551)  PHYSICAL EXAMINATION: General:  Awake. Alert. No acute distress. Mild central obesity.  Integument:  Warm & dry. No rash on exposed skin.  HEENT:  Moist mucus membranes, supple Cardiovascular:  S1s2, rrrr, No edema. No appreciable JVD given body habitus.  Pulmonary:  minimal work of breathing on room air. Speaking in complete sentences. Clear breath sounds throughout Abdomen: Soft. Normal bowel sounds. Protuberant. Grossly nontender. Musculoskeletal:  Normal bulk and tone. No joint deformity or effusion appreciated. Neurological: Moving all 4 extremities equally.    Recent Labs Lab 10/30/15 1739 10/31/15 0538 11/01/15 0846  NA 137 137 137  K 5.0 4.7 4.3  CL 100* 101 103  CO2 '27 30 28  '$ BUN 37* 45* 45*  CREATININE 2.37* 2.44* 2.16*  GLUCOSE 164* 178* 292*    Recent Labs Lab  10/30/15 1739 10/31/15 0538 11/01/15 0327  HGB 13.5 11.3* 12.5*  HCT 42.6 36.0* 39.8  WBC 17.5* 13.0* 14.5*  PLT 210 181 225   Dg Esophagus  10/31/2015  CLINICAL DATA:  82 year old with occasional dysphagia. History of gastroesophageal reflux disease and  shortness of breath. EXAM: ESOPHOGRAM/BARIUM SWALLOW TECHNIQUE: Single contrast examination was performed using  thin barium. FLUOROSCOPY TIME:  Radiation Exposure Index (as provided by the fluoroscopic device): 190.65 uGy*m2 If the device does not provide the exposure index: Fluoroscopy Time:  1 minute and 18 seconds of pulsed fluoro Number of Acquired Images:  Fluoro only COMPARISON:  Chest CT 10/31/2015 FINDINGS: Study was performed in the erect and prone positions. The patient swallowed the barium without difficulty. Rapid sequence imaging of the pharynx in the lateral and AP projections demonstrates no mucosal lesions or laryngeal penetration. There is mild esophageal dysmotility with a decreased primary stripping wave and increased tertiary contractions. No evidence of esophageal mass, stricture or ulceration. No significant hiatal hernia or reflux demonstrated. Water siphon test not performed as patient could not lie supine. A 13 mm barium tablet was administered and passed without delay into the stomach. IMPRESSION: Mild presbyesophagus.  No anatomic abnormalities identified. Electronically Signed   By: Richardean Sale M.D.   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 The patient has no physical exam findings or joint complaints that would fit with an autoimmune process. However, his ongoing and worsening dyspnea, cough, and wheezing are perplexing. He has no signs of volume overload that would suggest decompensated diastolic congestive heart failure and his serum BNP while elevated is only mildly so and significantly less than his previous hospitalization with decompensated diastolic congestive heart failure. With his elevated white blood cell count & green sputum,there is suspicion about an infectious  process contributing to his cough. Reviewing the patient's prior pulmonary function testing from December shows no evidence for airway dysfunction to suggest either asthma or COPD.   Acute Bronchitis suspected: - empiric Rocephin & azithromycin x 5ds total - Follow cultures as above - Low PCT reassuring  ILD:  -Given no response to above, will start  prednisone 40 mg & titrate based on sugars  - ambulatory oximetry on room air to assess for desaturation. -Outpatient pulmonary follow up  Endobronchial lesion in bronchus intermedius on HRCT  - may be mucus but will need bronchoscopy at some point to clarify - plan for outpt   GERD/ Dysphagia: - Continue PPI - barium swallow with mild presbyesophagus   Kara Mead MD. Shade Flood. [facility] Pager [phone] If no response call [phone]   11/02/2015      11/02/2015, 10:07 AM

## 2015-11-02 NOTE — Progress Notes (Signed)
Hypoglycemic Event  CBG: 38  Treatment: graham crackers and peanut butter  Symptoms: none  Follow-up CBG: Time:0726 CBG Result 57  Possible Reasons for Event: Pt got 25units of levemir  Comments/MD notified: hypoglycemia protocol followed    Vernon Prey

## 2015-11-02 NOTE — Progress Notes (Signed)
Patient ID: Justin Leach, male   DOB: [birth year], 82 y.o.   MRN: [id number]   PROGRESS NOTE    Justin Leach  [id number] DOB: [birth year] DOA: 10/30/2015  PCP: Irven Shelling, MD   Brief Narrative:  82 year old male with atrial fibrillation, diastolic congestive heart failure, & chronic renal failure, presented to [facility] ED for evaluation of several months duration of progressively worsening dyspnea, cough productive of greenish sputum, malaise.   Assessment & Plan:   Dyspnea secondary to ? Acute Bronchitis and ILD exacerbation  - pt reports persistent dyspnea, despite ABX - PCCM consulted  - continue Zithromax and Rocephin day #4/5 - plan to add Prednisone 40 mg PO QD per PCCM  - assess oxygen saturation with ambulation  - Endobronchial lesion in bronchus intermedius on HRCT - may be mucus but will need bronchoscopy at some point to clarify - plan for outpt follow up   ILD - per PCCM, follow, ANA, rheumatoid factor, and anti-CCP.  - plan to start Prednisone today to see if that would help - oxygen sat's with ambulation   GERD/ Dysphagia, presbyesophagus  - Continue PPI  Acute on chronic kidney disease, stage III - Cr trending down from 2.4 --> 2.1 - monitor renal function   DM type II with complications of nephropathy - pt with severe hypoglycemic events inpatient - will stop am dose of Insulin Levemir and continue only QHS dosing, see if that will help  - appreciate diabetic educator assistance   Atrial fib, CHADS 2 score 4 - continue Coumadin  - rate controlled  Obesity  - Body mass index is 30.12 kg/(m^2).  DVT prophylaxis: Coumadin  Code Status: Full  Family Communication: Patient at bedside  Disposition Plan: Home once cleared by PCCM, possibly in 1-2 days   Consultants:   PCCM  Procedures and recent studies: TTE (03/02/15): LV normal in size with severe focal basal septal hypertrophy. EF 55-60%. Normal wall motion. Unable to assess diastolic  dysfunction due to atrial fibrillation. LA & RA normal in size. RV normal in size and function. No aortic stenosis or regurgitation. Normal aortic root size. Trivial mitral regurgitation without stenosis. No pulmonic stenosis. Mild tricuspid regurgitation. Pulmonary artery normal in size. No pericardial effusion.  PFT (04/26/15): FVC 1.59 L (82%) FEV1 1.25 L (89%) FEV1/FVC 0.79 FEF 25-75 1.28 L (93%) negative bronchodilator response TLC 2.69 L (45%) RV 55% ERV 38% DLCO uncorrected 53%  HRCT CHEST W/O 06/12/15 No pleural effusion or thickening. No pathologic mediastinal adenopathy or mediastinal mass. No pericardial effusion. 7 mm nodule in right lower lobe noted. No traction bronchiectasis or honeycomb changes. Predominantly dependent subpleural reticular changes and small amount of groundglass opacification. This appears more prevalent within the lower lung zones.  Antimicrobials:   Zithromax 6/12 -->  Rocephin 6/12 -->   Subjective: Still with dyspnea with exertion.   Objective: Filed Vitals:   11/01/15 1916 11/02/15 0551 11/02/15 1021 11/02/15 1143  BP: 167/57 176/42 174/48 196/56  Pulse: 61 51 55 59  Temp: 98 F (36.7 C) 98.4 F (36.9 C) 98.2 F (36.8 C) 97.4 F (36.3 C)  TempSrc: Oral Oral Oral Oral  Resp: 19 19 18 17   Height:      Weight:  82.691 kg (182 lb 4.8 oz)    SpO2: 93% 97% 94% 98%    Intake/Output Summary (Last 24 hours) at 11/02/15 1841 Last data filed at 11/02/15 1300  Gross per 24 hour  Intake   1263 ml  Output  1100 ml  Net    163 ml   Filed Weights   10/31/15 0143 11/01/15 0608 11/02/15 0551  Weight: 84.596 kg (186 lb 8 oz) 85.594 kg (188 lb 11.2 oz) 82.691 kg (182 lb 4.8 oz)    Examination:  General exam: Appears calm and comfortable  Respiratory system: Clear to auscultation. Respiratory effort normal. Cardiovascular system: S1 & S2 heard, RRR. No JVD, murmurs, rubs, gallops or clicks. No pedal edema. Gastrointestinal system: Abdomen is  nondistended, soft and nontender. No organomegaly or masses felt.  Central nervous system: Alert and oriented. No focal neurological deficits.  Data Reviewed: I have personally reviewed following labs and imaging studies  CBC:  Recent Labs Lab 10/30/15 1739 10/31/15 0538 11/01/15 0327  WBC 17.5* 13.0* 14.5*  NEUTROABS  --  10.4*  --   HGB 13.5 11.3* 12.5*  HCT 42.6 36.0* 39.8  MCV 92.8 92.1 93.6  PLT 210 181 [id number]   Basic Metabolic Panel:  Recent Labs Lab 10/30/15 1739 10/31/15 0538 11/01/15 0846  NA 137 137 137  K 5.0 4.7 4.3  CL 100* 101 103  CO2 27 30 28   GLUCOSE 164* 178* 292*  BUN 37* 45* 45*  CREATININE 2.37* 2.44* 2.16*  CALCIUM 9.4 8.7* 8.6*   Liver Function Tests:  Recent Labs Lab 10/31/15 0538  AST 23  ALT 20  ALKPHOS 80  BILITOT 0.5  PROT 5.2*  ALBUMIN 2.6*   Coagulation Profile:  Recent Labs Lab 10/30/15 1956 10/31/15 0538 11/01/15 0327 11/02/15 0617  INR 2.16* 2.27* 1.99* 2.31*   Cardiac Enzymes:  Recent Labs Lab 10/30/15 2355 10/31/15 0536 10/31/15 1156  TROPONINI 0.04* 0.05* 0.05*   CBG:  Recent Labs Lab 11/02/15 0726 11/02/15 0743 11/02/15 1148 11/02/15 1241 11/02/15 1636  GLUCAP 57* 82 195* 214* 308*   Urine analysis:    Component Value Date/Time   COLORURINE YELLOW 10/31/2015 0700   APPEARANCEUR CLEAR 10/31/2015 0700   LABSPEC 1.021 10/31/2015 0700   PHURINE 5.0 10/31/2015 0700   GLUCOSEU NEGATIVE 10/31/2015 0700   HGBUR NEGATIVE 10/31/2015 0700   BILIRUBINUR NEGATIVE 10/31/2015 0700   KETONESUR NEGATIVE 10/31/2015 0700   PROTEINUR 100* 10/31/2015 0700   UROBILINOGEN 0.2 05/11/2013 1632   NITRITE NEGATIVE 10/31/2015 0700   LEUKOCYTESUR NEGATIVE 10/31/2015 0700   Radiology Studies: No results found.  Scheduled Meds: . atorvastatin  40 mg Oral Daily  . azithromycin  500 mg Intravenous Q24H  . cefTRIAXone (ROCEPHIN)  IV  1 g Intravenous Q24H  . insulin aspart  0-15 Units Subcutaneous TID WC  . insulin  aspart  0-5 Units Subcutaneous QHS  . insulin detemir  25 Units Subcutaneous QHS  . metoprolol  50 mg Oral BID  . pantoprazole  40 mg Oral Daily  . predniSONE  40 mg Oral Q breakfast  . sodium chloride flush  3 mL Intravenous Q12H  . Warfarin - Pharmacist Dosing Inpatient   Does not apply q1800   Continuous Infusions:    Time spent: 20 minutes   Faye Ramsay, MD [facility] Pager [phone]  If 7PM-7AM, please contact night-coverage [email or web address] Password [facility] 11/02/2015, 6:41 PM

## 2015-11-02 NOTE — Care Management Obs Status (Signed)
[place] NOTIFICATION   Patient Details  Name: Justin Leach MRN: [id number] Date of Birth: 05/31/1931   Medicare Observation Status Notification Given:       Delrae Sawyers, RN 11/02/2015, 11:43 AM

## 2015-11-02 NOTE — Progress Notes (Signed)
ANTICOAGULATION CONSULT NOTE -Follow up  Pharmacy Consult for Coumadin Indication: atrial fibrillation  Allergies  Allergen Reactions  . 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 Patient subsequently taken off amiodarone.  . Nifedipine Other (See Comments)    Reaction: made gums swell up. Pt states that he had to have gum surgery after taking.     Patient Measurements: Height: 5\' 6"  (167.6 cm) Weight: 182 lb 4.8 oz (82.691 kg) IBW/kg (Calculated) : 63.8  Vital Signs: Temp: 97.4 F (36.3 C) (06/15 1143) Temp Source: Oral (06/15 1143) BP: 196/56 mmHg (06/15 1143) Pulse Rate: 59 (06/15 1143)  Labs:  Recent Labs  10/30/15 1739  10/30/15 2355 10/31/15 0536 10/31/15 0538 10/31/15 1156 11/01/15 0327 11/01/15 0846 11/02/15 0617  HGB 13.5  --   --   --  11.3*  --  12.5*  --   --   HCT 42.6  --   --   --  36.0*  --  39.8  --   --   PLT 210  --   --   --  181  --  225  --   --   LABPROT  --   < >  --   --  24.8*  --  22.5*  --  25.1*  INR  --   < >  --   --  2.27*  --  1.99*  --  2.31*  CREATININE 2.37*  --   --   --  2.44*  --   --  2.16*  --   TROPONINI  --   --  0.04* 0.05*  --  0.05*  --   --   --   < > = values in this interval not displayed.  Estimated Creatinine Clearance: 27.1 mL/min (by C-G formula based on Cr of 2.16).   Medical History: Past Medical History  Diagnosis Date  . Essential hypertension   . Type II diabetes mellitus ([place])   . Pneumonia 2007  . Cervical disc disease   . Dyslipidemia   . CKD (chronic kidney disease), stage III   . GERD (gastroesophageal reflux disease)   . PAF (paroxysmal atrial fibrillation) ([place])     a. Dx 02/2015 - in and out on tele, rx'd Coumadin and amiodarone.  . Chronic diastolic CHF (congestive heart failure) ([place])     a. Dx 02/2015 -  2D echo 03/02/15 showed severe focal basal hypertrophy of the septum, EF 55-60%,  mild MR, no effusion.  . Coronary artery calcification seen on CT scan     a. Nuc 02/2015 was normal.  . Bradycardia     a. during 02/2015 admission - occasional HR in 40s while being treated for atrial fib.  . 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   Current Outpatient Prescriptions on File Prior to Encounter  Medication Sig Dispense Refill  . atorvastatin (LIPITOR) 80 MG tablet Take 40 mg by mouth daily.  2  . Cholecalciferol (VITAMIN D-3) 1000 UNITS CAPS Take 1 capsule by mouth daily.    . furosemide (LASIX) 40 MG tablet Take 40 mg by mouth daily.    Marland Kitchen lisinopril (PRINIVIL,ZESTRIL) 10 MG tablet Take 1 tablet (10 mg total) by mouth daily.    . metoprolol (LOPRESSOR) 50 MG tablet Take 50  mg by mouth 2 (two) times daily.    Marland Kitchen omeprazole (PRILOSEC) 20 MG capsule Take 20 mg by mouth daily.     . vitamin B-12 (CYANOCOBALAMIN) 1000 MCG tablet Take 1,000 mcg by mouth daily.    . insulin detemir (LEVEMIR) 100 UNIT/ML injection Inject 0.1 mLs (10 Units total) into the skin at bedtime. (Patient taking differently: Inject 25-35 Units into the skin See admin instructions. 35 units in the morning and 25 units at night) 10 mL 11   Assessment: 82 y.o. male admitted 10/30/15 with SOB/acute bronchitis, h/o Afib, to continue Coumadin.  PTA:  Warfarin 2mg  prescription (per [facility]) last filled on 10/18/15.  Instructions were to take 3mg  qMWF and 2mg  qTTSS.  However, the patient reported that he takes 3mg  daily and last taken PTA on 10/29/15.  INR = 2.31 today, therapeutic INR.   INR decreased yesterday to 1.99 likely due to no coumadin given on admit date 6/12  INR trend since admit date 6/12: INR 2.16>2.27>1.99>2.31 CBC last done 6/14 was stable . No bleeding noted.  Day #4 Azithromycin- which can increase the coumadin effect.    Goal of Therapy:  INR  2-3 Monitor platelets by anticoagulation protocol: Yes   Plan:  Give Coumadin 2 mg today x1 Daily INR Watch for drug interaction with azithromycin  Nicole Cella, RPh Clinical Pharmacist Pager: [phone] 11/02/2015 12:40 PM

## 2015-11-03 DIAGNOSIS — J849 Interstitial pulmonary disease, unspecified: Secondary | ICD-10-CM | POA: Diagnosis not present

## 2015-11-03 DIAGNOSIS — I10 Essential (primary) hypertension: Secondary | ICD-10-CM | POA: Diagnosis not present

## 2015-11-03 DIAGNOSIS — R05 Cough: Secondary | ICD-10-CM | POA: Diagnosis not present

## 2015-11-03 DIAGNOSIS — N183 Chronic kidney disease, stage 3 (moderate): Secondary | ICD-10-CM | POA: Diagnosis not present

## 2015-11-03 DIAGNOSIS — K219 Gastro-esophageal reflux disease without esophagitis: Secondary | ICD-10-CM | POA: Diagnosis present

## 2015-11-03 DIAGNOSIS — I5032 Chronic diastolic (congestive) heart failure: Secondary | ICD-10-CM | POA: Diagnosis not present

## 2015-11-03 DIAGNOSIS — R059 Cough, unspecified: Secondary | ICD-10-CM | POA: Diagnosis present

## 2015-11-03 DIAGNOSIS — J40 Bronchitis, not specified as acute or chronic: Secondary | ICD-10-CM | POA: Diagnosis not present

## 2015-11-03 DIAGNOSIS — J8410 Pulmonary fibrosis, unspecified: Secondary | ICD-10-CM

## 2015-11-03 LAB — GLUCOSE, CAPILLARY
GLUCOSE-CAPILLARY: 185 mg/dL — AB (ref 65–99)
GLUCOSE-CAPILLARY: 187 mg/dL — AB (ref 65–99)
Glucose-Capillary: 160 mg/dL — ABNORMAL HIGH (ref 65–99)
Glucose-Capillary: 203 mg/dL — ABNORMAL HIGH (ref 65–99)

## 2015-11-03 LAB — BASIC METABOLIC PANEL
ANION GAP: 4 — AB (ref 5–15)
BUN: 40 mg/dL — ABNORMAL HIGH (ref 6–20)
CALCIUM: 9 mg/dL (ref 8.9–10.3)
CO2: 28 mmol/L (ref 22–32)
Chloride: 104 mmol/L (ref 101–111)
Creatinine, Ser: 1.75 mg/dL — ABNORMAL HIGH (ref 0.61–1.24)
GFR, EST AFRICAN AMERICAN: 40 mL/min — AB (ref >60)
GFR, EST NON AFRICAN AMERICAN: 35 mL/min — AB (ref >60)
GLUCOSE: 212 mg/dL — AB (ref 65–99)
Potassium: 5.8 mmol/L — ABNORMAL HIGH (ref 3.5–5.1)
Sodium: 136 mmol/L (ref 135–145)

## 2015-11-03 LAB — CBC
HCT: 37.9 % — ABNORMAL LOW (ref 39.0–52.0)
Hemoglobin: 12 g/dL — ABNORMAL LOW (ref 13.0–17.0)
MCH: 29.2 pg (ref 26.0–34.0)
MCHC: 31.7 g/dL (ref 30.0–36.0)
MCV: 92.2 fL (ref 78.0–100.0)
PLATELETS: 234 10*3/uL (ref 150–400)
RBC: 4.11 MIL/uL — ABNORMAL LOW (ref 4.22–5.81)
RDW: 14.6 % (ref 11.5–15.5)
WBC: 12 10*3/uL — AB (ref 4.0–10.5)

## 2015-11-03 LAB — PROTIME-INR
INR: 2.09 — AB (ref 0.00–1.49)
PROTHROMBIN TIME: 23.4 s — AB (ref 11.6–15.2)

## 2015-11-03 LAB — PROCALCITONIN: PROCALCITONIN: 0.1 ng/mL

## 2015-11-03 MED ORDER — INSULIN ASPART 100 UNIT/ML ~~LOC~~ SOLN
5.0000 [IU] | Freq: Three times a day (TID) | SUBCUTANEOUS | Status: DC
  Administered 2015-11-03: 5 [IU] via SUBCUTANEOUS

## 2015-11-03 MED ORDER — PREDNISONE 20 MG PO TABS
20.0000 mg | ORAL_TABLET | Freq: Every day | ORAL | Status: DC
  Administered 2015-11-04 – 2015-11-05 (×2): 20 mg via ORAL
  Filled 2015-11-03 (×2): qty 1

## 2015-11-03 MED ORDER — WARFARIN SODIUM 3 MG PO TABS
3.0000 mg | ORAL_TABLET | Freq: Once | ORAL | Status: AC
  Administered 2015-11-03: 3 mg via ORAL
  Filled 2015-11-03: qty 1

## 2015-11-03 MED ORDER — GUAIFENESIN-CODEINE 100-10 MG/5ML PO SOLN
10.0000 mL | ORAL | Status: DC | PRN
Start: 2015-11-03 — End: 2015-11-05
  Administered 2015-11-03 – 2015-11-05 (×8): 10 mL via ORAL
  Filled 2015-11-03 (×8): qty 10

## 2015-11-03 MED ORDER — HYDRALAZINE HCL 20 MG/ML IJ SOLN
10.0000 mg | INTRAMUSCULAR | Status: DC | PRN
  Administered 2015-11-03 – 2015-11-05 (×6): 10 mg via INTRAVENOUS
  Filled 2015-11-03 (×6): qty 1

## 2015-11-03 NOTE — Progress Notes (Signed)
ANTICOAGULATION CONSULT NOTE -Follow up  Pharmacy Consult for Coumadin Indication: h/o atrial fibrillation  Allergies  Allergen Reactions  . 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 Patient subsequently taken off amiodarone.  . Nifedipine Other (See Comments)    Reaction: made gums swell up. Pt states that he had to have gum surgery after taking.     Patient Measurements: Height: 5\' 6"  (167.6 cm) Weight: 191 lb 4.8 oz (86.773 kg) IBW/kg (Calculated) : 63.8  Vital Signs: Temp: 97.8 F (36.6 C) (06/16 0640) Temp Source: Oral (06/16 0640) BP: 166/50 mmHg (06/16 0640) Pulse Rate: 53 (06/16 0640)  Labs:  Recent Labs  10/31/15 1156 11/01/15 0327 11/01/15 0846 11/02/15 0617 11/03/15 0429  HGB  --  12.5*  --   --  12.0*  HCT  --  39.8  --   --  37.9*  PLT  --  225  --   --  234  LABPROT  --  22.5*  --  25.1* 23.4*  INR  --  1.99*  --  2.31* 2.09*  CREATININE  --   --  2.16*  --  1.75*  TROPONINI 0.05*  --   --   --   --     Estimated Creatinine Clearance: 34.2 mL/min (by C-G formula based on Cr of 1.75).   Medical History: Past Medical History  Diagnosis Date  . Essential hypertension   . Type II diabetes mellitus ([place])   . Pneumonia 2007  . Cervical disc disease   . Dyslipidemia   . CKD (chronic kidney disease), stage III   . GERD (gastroesophageal reflux disease)   . PAF (paroxysmal atrial fibrillation) ([place])     a. Dx 02/2015 - in and out on tele, rx'd Coumadin and amiodarone.  . Chronic diastolic CHF (congestive heart failure) ([place])     a. Dx 02/2015 -  2D echo 03/02/15 showed severe focal basal hypertrophy of the septum, EF 55-60%, mild MR, no effusion.  . Coronary artery calcification seen on CT scan     a. Nuc 02/2015 was normal.  . Bradycardia     a. during 02/2015 admission - occasional HR in 40s while being treated for atrial fib.  . ILD  (interstitial lung disease) ([facility])     NSIP vs Amiodarone Induced Lung Injury    Medications:  No current facility-administered medications on file prior to encounter.   Current Outpatient Prescriptions on File Prior to Encounter  Medication Sig Dispense Refill  . atorvastatin (LIPITOR) 80 MG tablet Take 40 mg by mouth daily.  2  . Cholecalciferol (VITAMIN D-3) 1000 UNITS CAPS Take 1 capsule by mouth daily.    . furosemide (LASIX) 40 MG tablet Take 40 mg by mouth daily.    Marland Kitchen lisinopril (PRINIVIL,ZESTRIL) 10 MG tablet Take 1 tablet (10 mg total) by mouth daily.    . metoprolol (LOPRESSOR) 50 MG tablet Take 50 mg by mouth 2 (two) times daily.    Marland Kitchen omeprazole (PRILOSEC) 20 MG capsule Take 20 mg by mouth daily.     . vitamin B-12 (CYANOCOBALAMIN) 1000 MCG tablet Take 1,000 mcg by mouth daily.    . insulin detemir (LEVEMIR) 100 UNIT/ML injection Inject 0.1 mLs (10 Units total) into the skin at bedtime. (Patient taking differently: Inject 25-35 Units into the skin See admin instructions. 35 units in the morning and 25 units at night) 10 mL 11  Assessment: 82 y.o. male admitted 10/30/15 with SOB/acute bronchitis, h/o Afib, continues Coumadin.  PTA:  Warfarin 2mg  prescription (per [facility]) last filled on 10/18/15.  Instructions were to take 3mg  qMWF and 2mg  qTTSS.  However, the patient reported that he takes 3mg  daily and last taken PTA on 10/29/15.  INR on admit date 6/12 was 2.16   INR = 2.09 today, therpeutic INR. CBC stable. No bleeding noted.  Day #5/5 Azithromycin- which can increase the coumadin effect.    Goal of Therapy:  INR 2-3 Monitor platelets by anticoagulation protocol: Yes   Plan:  Give Coumadin 3 mg today x1 (hopefully can resume PTA dose of 3mg  qMWF and 2mg  qTTSS Daily INR Watch for drug interaction with azithromycin- Day#5 today thus should end today.  Nicole Cella, RPh Clinical Pharmacist Pager: [phone] 11/03/2015 11:19 AM

## 2015-11-03 NOTE — Progress Notes (Signed)
Patient does not want bed alarm on. He states, " I do not want to be treated like a baby". Safety explained to the patient and he does agree to wear fall risk bracelet and yellow socks. Patient will be rounded on frequently.

## 2015-11-03 NOTE — Progress Notes (Signed)
[name] b/p = 173/64 @ 1213 today.  Hydralazine 10mg  IV ordred q 4 hours prn SBP > 160 DBP > 100.  Hydralazine 10 mg IV given @ 1515.  B/P 183/52 1 hour later and 152/47 currently.

## 2015-11-03 NOTE — Progress Notes (Signed)
Patient ID: Justin Leach, male   DOB: [birth year], 82 y.o.   MRN: [id number]   PROGRESS NOTE    ERIKSEN SABATELLI  [id number] DOB: 1932/05/13 DOA: 10/30/2015  PCP: Irven Shelling, MD   Brief Narrative:  82 year old male with atrial fibrillation, diastolic congestive heart failure, & chronic renal failure, presented to [facility] ED for evaluation of several months duration of progressively worsening dyspnea, cough productive of greenish sputum, malaise.   Assessment & Plan:   Dyspnea secondary to ? Acute Bronchitis and ILD exacerbation  - pt reports persistent dyspnea, despite ABX - PCCM consulted and started on prednisone.  Pt has follow up with his outpatient pulmonologist arranged.  - continue Zithromax and Rocephin day #5/5 - Prednisone 20 mg PO QD per PCCM  - assess oxygen saturation with ambulation  - Endobronchial lesion in bronchus intermedius on HRCT - may be mucus but will need bronchoscopy at some point to clarify - plan for outpt follow up   ILD - per PCCM, follow, ANA, rheumatoid factor, and anti-CCP.  - plan to start Prednisone today to see if that would help - oxygen sats with ambulation  - Will get PT/OT evaluation  GERD/ Dysphagia, presbyesophagus  - Continue PPI  Acute on chronic kidney disease, stage III - Cr trending down from 2.4 --> 2.1 ---> 1.75 - monitor renal function   DM type II with complications of nephropathy - adjusted insulin secondary to hypoglycemia - continue levemir, add mealtime coverage - appreciate diabetic educator assistance   Atrial fib, CHADS 2 score 4 - continue Coumadin  - rate controlled  Obesity  - Body mass index is 30.12 kg/(m^2).  DVT prophylaxis: Coumadin  Code Status: Full  Family Communication: Patient at bedside  Disposition Plan: Home soon.   Consultants:   PCCM  Procedures and recent studies: TTE (03/02/15): LV normal in size with severe focal basal septal hypertrophy. EF 55-60%. Normal wall motion.  Unable to assess diastolic dysfunction due to atrial fibrillation. LA & RA normal in size. RV normal in size and function. No aortic stenosis or regurgitation. Normal aortic root size. Trivial mitral regurgitation without stenosis. No pulmonic stenosis. Mild tricuspid regurgitation. Pulmonary artery normal in size. No pericardial effusion.  PFT (04/26/15): FVC 1.59 L (82%) FEV1 1.25 L (89%) FEV1/FVC 0.79 FEF 25-75 1.28 L (93%) negative bronchodilator response TLC 2.69 L (45%) RV 55% ERV 38% DLCO uncorrected 53%  HRCT CHEST W/O 06/12/15 No pleural effusion or thickening. No pathologic mediastinal adenopathy or mediastinal mass. No pericardial effusion. 7 mm nodule in right lower lobe noted. No traction bronchiectasis or honeycomb changes. Predominantly dependent subpleural reticular changes and small amount of groundglass opacification. This appears more prevalent within the lower lung zones.  Antimicrobials:   Zithromax 6/12 -->  Rocephin 6/12 -->   Subjective: Still with dyspnea with exertion but patient says he is not ready to go home, PT/OT pending.    Objective: Filed Vitals:   11/02/15 1021 11/02/15 1143 11/02/15 2100 11/03/15 0640  BP: 174/48 196/56 163/55 166/50  Pulse: 55 59 60 53  Temp: 98.2 F (36.8 C) 97.4 F (36.3 C) 97.6 F (36.4 C) 97.8 F (36.6 C)  TempSrc: Oral Oral Oral Oral  Resp: 18 17 18 20   Height:      Weight:    191 lb 4.8 oz (86.773 kg)  SpO2: 94% 98% 94% 98%    Intake/Output Summary (Last 24 hours) at 11/03/15 1202 Last data filed at 11/03/15 [id number]  Gross per 24  hour  Intake   1260 ml  Output    500 ml  Net    760 ml   Filed Weights   11/01/15 0608 11/02/15 0551 11/03/15 0640  Weight: 188 lb 11.2 oz (85.594 kg) 182 lb 4.8 oz (82.691 kg) 191 lb 4.8 oz (86.773 kg)    Examination:  General exam: Appears calm and comfortable  Respiratory system: Clear to auscultation. Respiratory effort normal. Bibasilar wheezing heard.  Cardiovascular system: S1 & S2  heard, RRR. No JVD, murmurs, rubs, gallops or clicks. No pedal edema. Gastrointestinal system: Abdomen is nondistended, soft and nontender. No organomegaly or masses felt.  Central nervous system: Alert and oriented. No focal neurological deficits.  Data Reviewed: I have personally reviewed following labs and imaging studies  CBC:  Recent Labs Lab 10/30/15 1739 10/31/15 0538 11/01/15 0327 11/03/15 0429  WBC 17.5* 13.0* 14.5* 12.0*  NEUTROABS  --  10.4*  --   --   HGB 13.5 11.3* 12.5* 12.0*  HCT 42.6 36.0* 39.8 37.9*  MCV 92.8 92.1 93.6 92.2  PLT 210 181 225 [id number]   Basic Metabolic Panel:  Recent Labs Lab 10/30/15 1739 10/31/15 0538 11/01/15 0846 11/03/15 0429  NA 137 137 137 136  K 5.0 4.7 4.3 5.8*  CL 100* 101 103 104  CO2 27 30 28 28   GLUCOSE 164* 178* 292* 212*  BUN 37* 45* 45* 40*  CREATININE 2.37* 2.44* 2.16* 1.75*  CALCIUM 9.4 8.7* 8.6* 9.0   Liver Function Tests:  Recent Labs Lab 10/31/15 0538  AST 23  ALT 20  ALKPHOS 80  BILITOT 0.5  PROT 5.2*  ALBUMIN 2.6*   Coagulation Profile:  Recent Labs Lab 10/30/15 1956 10/31/15 0538 11/01/15 0327 11/02/15 0617 11/03/15 0429  INR 2.16* 2.27* 1.99* 2.31* 2.09*   Cardiac Enzymes:  Recent Labs Lab 10/30/15 2355 10/31/15 0536 10/31/15 1156  TROPONINI 0.04* 0.05* 0.05*   CBG:  Recent Labs Lab 11/02/15 1148 11/02/15 1241 11/02/15 1636 11/02/15 2154 11/03/15 0646  GLUCAP 195* 214* 308* 349* 185*   Urine analysis:    Component Value Date/Time   COLORURINE YELLOW 10/31/2015 0700   APPEARANCEUR CLEAR 10/31/2015 0700   LABSPEC 1.021 10/31/2015 0700   PHURINE 5.0 10/31/2015 0700   GLUCOSEU NEGATIVE 10/31/2015 0700   HGBUR NEGATIVE 10/31/2015 0700   BILIRUBINUR NEGATIVE 10/31/2015 0700   KETONESUR NEGATIVE 10/31/2015 0700   PROTEINUR 100* 10/31/2015 0700   UROBILINOGEN 0.2 05/11/2013 1632   NITRITE NEGATIVE 10/31/2015 0700   LEUKOCYTESUR NEGATIVE 10/31/2015 0700   Radiology Studies: No  results found.  Scheduled Meds: . atorvastatin  40 mg Oral Daily  . azithromycin  500 mg Intravenous Q24H  . cefTRIAXone (ROCEPHIN)  IV  1 g Intravenous Q24H  . insulin aspart  0-15 Units Subcutaneous TID WC  . insulin aspart  0-5 Units Subcutaneous QHS  . insulin aspart  5 Units Subcutaneous TID WC  . insulin detemir  25 Units Subcutaneous QHS  . metoprolol  50 mg Oral BID  . pantoprazole  40 mg Oral Daily  . [START ON 11/04/2015] predniSONE  20 mg Oral Q breakfast  . sodium chloride flush  3 mL Intravenous Q12H  . warfarin  3 mg Oral ONCE-1800  . Warfarin - Pharmacist Dosing Inpatient   Does not apply q1800    Time spent: 23 minutes   Irwin Brakeman, MD [facility] Pager [phone]  If 7PM-7AM, please contact night-coverage [email or web address] Password [facility] 11/03/2015, 12:02 PM

## 2015-11-03 NOTE — Progress Notes (Signed)
MD ordered ambulation with 02 Sat check.  JaKeema, RN offered earlier to day to assist ambulation and patient declined asking to walk this afternoon.  Patient offered to walk at present time and explained that MD wanted 02 saturation check. He declined at present and stated he will let tonight's nurse know when he is ready to walk.

## 2015-11-03 NOTE — Progress Notes (Addendum)
Name: ESA RADEN MRN: [id number] DOB: Feb 09, 1932    ADMISSION DATE:  10/30/2015 CONSULTATION DATE:  10/30/2015  REFERRING MD :  Toy Baker, M.D. / [facility]  CHIEF COMPLAINT:  Acute Bronchitis w/ Possible ILD Flare  BRIEF PATIENT DESCRIPTION: 82 year old male followed by Dr. Vaughan Browner. Admitted October 2016 with new onset atrial fibrillation, diastolic congestive heart failure, & acute on chronic renal failure. Placed on amiodarone at that time. Chest imaging in January showed new findings concerning for amiodaroollowed by Dr. Vaughan Browner. Admitted October 2016 with new onset atrial fibrillation, diastolic congestive heart failure, & acute on chne toxicity versus NSIP. Patient subsequently taken off amiodarone.   SIGNIFICANT EVENTS  6/12 - Admit  STUDIES:  TTE (03/02/15): LV normal in size with severe focal basal septal hypertrophy. EF 55-60%. Normal wall motion. Unable to assess diastolic dysfunction due to atrial fibrillation. LA & RA normal in size. RV normal in size and function. No aortic stenosis or regurgitation. Normal aortic root size. Trivial mitral regurgitation without stenosis. No pulmonic stenosis. Mild tricuspid regurgitation. Pulmonary artery normal in size. No pericardial effusion.  PFT (04/26/15): FVC 1.59 L (82%) FEV1 1.25 L (89%) FEV1/FVC 0.79 FEF 25-75 1.28 L (93%) negative bronchodilator response TLC 2.69 L (45%) RV 55% ERV 38% DLCO uncorrected 53%  HRCT CHEST W/O 06/12/15 :  7 mm nodule in right lower lobe noted. No traction bronchiectasis or honeycomb changes. Predominantly dependent subpleural reticular changes and small amount of groundglass opacification. This appears more prevalent within the lower lung zones.  HRCT 6/13 > Similar to prior exam favored to represent NSIP, 32m endobronchial nodule in proximal bronchus intermedius. Stable RLL subpleural nodule.   LABS 07/14/15 ANA: Negative Centromere Ab Screen:  <1.0 Anti-CCP:  >250 RF:  <10 Smith Ab:  <1.0 DS  DNA Ab:  <1 Jo-1 Ab:  <1.0 SSA:  <1.0 SSB:  <1.0 SCL-70:  <1.0 RNP:  <1.0  10/30/15 ESR: 38 CRP: 14.9 ANA: neg RF:pos Anti-CCP : 107  Legionella antigen 6/12 > neg Streptococcal antigen 6/12 >neg Sputum Cx 6/12 > Sputum AFB 6/12>  SUBJECTIVE:  afebrile pt still having dyspnea with activity   Denies chest pain, wheezing, nausea, or vomiting Sugars high  VITAL SIGNS: Temp:  [97.4 F (36.3 C)-97.8 F (36.6 C)] 97.8 F (36.6 C) (06/16 0640) Pulse Rate:  [53-60] 53 (06/16 0640) Resp:  [17-20] 20 (06/16 0640) BP: (163-196)/(50-56) 166/50 mmHg (06/16 0640) SpO2:  [94 %-98 %] 98 % (06/16 0640) Weight:  [191 lb 4.8 oz (86.773 kg)] 191 lb 4.8 oz (86.773 kg) (06/16 0640)  PHYSICAL EXAMINATION: General:  Awake. Alert. No acute distress. Mild central obesity.  Integument:  Warm & dry. No rash on exposed skin.  HEENT:  Moist mucus membranes, supple Cardiovascular:  S1s2, rrrr, No edema. No appreciable JVD given body habitus.  Pulmonary:  minimal work of breathing on room air. Speaking in complete sentences. Clear breath sounds throughout Abdomen: Soft. Normal bowel sounds. Protuberant. Grossly nontender. Musculoskeletal:  Normal bulk and tone. No joint deformity or effusion appreciated. Neurological: Moving all 4 extremities equally.    Recent Labs Lab 10/31/15 0538 11/01/15 0846 11/03/15 0429  NA 137 137 136  K 4.7 4.3 5.8*  CL 101 103 104  CO2 '30 28 28  '$ BUN 45* 45* 40*  CREATININE 2.44* 2.16* 1.75*  GLUCOSE 178* 292* 212*    Recent Labs Lab 10/31/15 0538 11/01/15 0327 11/03/15 0429  HGB 11.3* 12.5* 12.0*  HCT 36.0* 39.8 37.9*  WBC 13.0* 14.5* 12.0*  PLT 181 225 234   No results found.  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 The patient has no physical exam findings or joint  complaints that would fit with an autoimmune process. He has no signs of volume overload that would suggest decompensated diastolic congestive heart failure and his serum BNP while elevated is only mildly so and significantly less than his previous hospitalization with decompensated diastolic congestive heart failure. With his elevated white blood cell count & green sputum,there is suspicion about an infectious process contributing to his cough. Reviewing the patient's prior pulmonary function testing from December shows no evidence for airway dysfunction to suggest either asthma or COPD.   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 -Outpatient pulmonary follow up with dr Vaughan Browner arranged  Endobronchial lesion in bronchus intermedius on HRCT  - may be mucus but will need bronchoscopy at some point to clarify - plan for outpt   GERD/ Dysphagia: - Continue PPI - barium swallow with mild presbyesophagus   PT consult [facility] and available as needed   Kara Mead MD. FCCP. [facility] Pager [phone] If no response call [phone]   11/03/2015, 10:50 AM

## 2015-11-03 NOTE — Consult Note (Signed)
   [facility] CM Inpatient Consult   11/03/2015  DARON STUTZ April 17, 1932 [id number]  Referral received to assess for care management services.    Met with the patient regarding the benefits of [place] [facility] services.  Patient states he has [employer] as primary and he has traditional Medicare and listed with the [place]. Explained that [place] [facility] is a covered benefit of insurance.  Patient states he currently feels he has no current post follow up needs.  He states he was pretty active prior to admission and plans to continue to be out and about.  He states he has MD appointments every two weeks for his lab work.  Review information for [facility] Care Management and a folder was provided with contact information.  Explained that [facility] does not interfere with or replace any services arranged by the inpatient care management staff.  Patient declined services with [place] [facility].   A brochure with contact information was given.  He states his primary care provider is Dr. Lavone Orn.  For questions, please contact:  Natividad Brood, RN BSN [facility] Liaison  [phone] business mobile phone Toll free office [phone]

## 2015-11-04 DIAGNOSIS — N183 Chronic kidney disease, stage 3 (moderate): Secondary | ICD-10-CM | POA: Diagnosis not present

## 2015-11-04 DIAGNOSIS — I5032 Chronic diastolic (congestive) heart failure: Secondary | ICD-10-CM | POA: Diagnosis not present

## 2015-11-04 DIAGNOSIS — I10 Essential (primary) hypertension: Secondary | ICD-10-CM | POA: Diagnosis not present

## 2015-11-04 DIAGNOSIS — R05 Cough: Secondary | ICD-10-CM | POA: Diagnosis not present

## 2015-11-04 LAB — PROTIME-INR
INR: 2.16 — ABNORMAL HIGH (ref 0.00–1.49)
Prothrombin Time: 23.9 seconds — ABNORMAL HIGH (ref 11.6–15.2)

## 2015-11-04 LAB — GLUCOSE, CAPILLARY
GLUCOSE-CAPILLARY: 30 mg/dL — AB (ref 65–99)
GLUCOSE-CAPILLARY: 53 mg/dL — AB (ref 65–99)
GLUCOSE-CAPILLARY: 65 mg/dL (ref 65–99)
GLUCOSE-CAPILLARY: 99 mg/dL (ref 65–99)
GLUCOSE-CAPILLARY: 193 mg/dL — AB (ref 65–99)
GLUCOSE-CAPILLARY: 197 mg/dL — AB (ref 65–99)
Glucose-Capillary: 286 mg/dL — ABNORMAL HIGH (ref 65–99)

## 2015-11-04 MED ORDER — WARFARIN SODIUM 3 MG PO TABS
3.0000 mg | ORAL_TABLET | Freq: Once | ORAL | Status: AC
  Administered 2015-11-04: 3 mg via ORAL
  Filled 2015-11-04: qty 1

## 2015-11-04 MED ORDER — INSULIN DETEMIR 100 UNIT/ML ~~LOC~~ SOLN
15.0000 [IU] | Freq: Every day | SUBCUTANEOUS | Status: DC
  Administered 2015-11-05: 15 [IU] via SUBCUTANEOUS
  Filled 2015-11-04 (×2): qty 0.15

## 2015-11-04 MED ORDER — INSULIN ASPART 100 UNIT/ML ~~LOC~~ SOLN
3.0000 [IU] | Freq: Once | SUBCUTANEOUS | Status: AC
  Administered 2015-11-04: 3 [IU] via SUBCUTANEOUS

## 2015-11-04 MED ORDER — INSULIN ASPART 100 UNIT/ML ~~LOC~~ SOLN
3.0000 [IU] | Freq: Three times a day (TID) | SUBCUTANEOUS | Status: DC
  Administered 2015-11-05 (×2): 3 [IU] via SUBCUTANEOUS

## 2015-11-04 NOTE — Progress Notes (Signed)
Pt refused bed alarm on, pt's safety explained but continued to refused bed alarm. Will continue to do hourly rounding.

## 2015-11-04 NOTE — Progress Notes (Signed)
Patient alert and oriented, pt.refuse bed alarm despite of telling the patient the reason for the bed alarm for safety. Encourage/ educate pt. To call for assist. When need to get out of the bed. Will continue to monitor patient.

## 2015-11-04 NOTE — Evaluation (Addendum)
Physical Therapy Evaluation Patient Details Name: Justin Leach MRN: [id number] DOB: [birth year] Today's Date: 11/04/2015   History of Present Illness  Pt is a 82 y/o M who presented w/ progressive dyspnea.  Dx: dyspnea secondary to ?Acute bronchitis and ILD exacerbation.  Pt's PMH includes a-fib, cervical disc disease, CHF, posterior fusion cervical spine, TKA, Bil rotator cuff repair.    Clinical Impression  Pt admitted with above diagnosis. Pt currently with functional limitations due to the deficits listed below (see PT Problem List). Justin Leach presents w/ DOE, requiring 1 standing rest break when ambulating in hallway.  SpO2 remains at or above 93% on 2L O2 while ambulating. Pt will benefit most from Cardiopulmonary Rehab. Pt will benefit from skilled PT to increase their independence and safety with mobility to allow discharge to the venue listed below.      Follow Up Recommendations Home health PT;Supervision for mobility/OOB    Equipment Recommendations  None recommended by PT    Recommendations for Other Services OT consult;Other (comment) (Cardiopulmonary Rehab)     Precautions / Restrictions Precautions Precautions: Fall Precaution Comments: monitor O2 Restrictions Weight Bearing Restrictions: No      Mobility  Bed Mobility               General bed mobility comments: Pt sitting EOB upon Pt arrival  Transfers Overall transfer level: Needs assistance Equipment used: Straight cane Transfers: Sit to/from Stand Sit to Stand: Supervision         General transfer comment: Supervision for safety  Ambulation/Gait Ambulation/Gait assistance: Min guard Ambulation Distance (Feet): 200 Feet Assistive device: Straight cane Gait Pattern/deviations: Step-through pattern;Decreased stride length   Gait velocity interpretation: Below normal speed for age/gender General Gait Details: 1 standing rest break due to SOB.  SpO2 remains at or above 93% while ambulating  and pt talking most of the time.  Min guard as pt is mildly unsteady.  Stairs            Wheelchair Mobility    Modified Rankin (Stroke Patients Only)       Balance Overall balance assessment: Needs assistance Sitting-balance support: No upper extremity supported;Feet supported Sitting balance-Leahy Scale: Good     Standing balance support: No upper extremity supported;During functional activity Standing balance-Leahy Scale: Fair Standing balance comment: Pt able to let go of cane and answer cell phone in static stance                             Pertinent Vitals/Pain Pain Assessment: No/denies pain    Home Living Family/patient expects to be discharged to:: Private residence Living Arrangements: Spouse/significant other Available Help at Discharge: Family;Available PRN/intermittently Type of Home: House Home Access: Ramped entrance     Home Layout: One level Home Equipment: Cane - single point;Hand held shower head;Grab bars - tub/shower;Grab bars - toilet;Shower seat      Prior Function Level of Independence: Independent with assistive device(s)         Comments: Uses cane and denies any falls in the past 6 months. Pt ambulating distances to take out the trash and back but limited recently due to DOE.     Hand Dominance        Extremity/Trunk Assessment   Upper Extremity Assessment: Overall WFL for tasks assessed           Lower Extremity Assessment: Overall WFL for tasks assessed         Communication  Communication: HOH  Cognition Arousal/Alertness: Awake/alert Behavior During Therapy: WFL for tasks assessed/performed Overall Cognitive Status: Within Functional Limits for tasks assessed                      General Comments      Exercises Other Exercises Other Exercises: Encouraged pt to ambulate at least 3x/day w/ nursing staff      Assessment/Plan    PT Assessment Patient needs continued PT services   PT Diagnosis Difficulty walking   PT Problem List Decreased activity tolerance;Decreased balance;Decreased safety awareness;Cardiopulmonary status limiting activity  PT Treatment Interventions DME instruction;Gait training;Functional mobility training;Therapeutic activities;Therapeutic exercise;Balance training;Patient/family education   PT Goals (Current goals can be found in the Care Plan section) Acute Rehab PT Goals Patient Stated Goal: to feel better before he goes home PT Goal Formulation: With patient Time For Goal Achievement: 11/18/15 Potential to Achieve Goals: Good Additional Goals Additional Goal #1: Pt will be able to report 2 energy conservation technique without cues    Frequency Min 3X/week   Barriers to discharge Decreased caregiver support At home alone during the day    Co-evaluation               End of Session Equipment Utilized During Treatment: Gait belt;Oxygen Activity Tolerance: Patient tolerated treatment well Patient left: in chair;with call bell/phone within reach;with chair alarm set Nurse Communication: Mobility status;Other (comment) (SpO2; recommending Cardiopulmonary Rehab)         Time: [id number] PT Time Calculation (min) (ACUTE ONLY): 30 min   Charges:   PT Evaluation $PT Eval Low Complexity: 1 Procedure PT Treatments $Gait Training: 8-22 mins   PT G Codes:       Collie Siad PT, DPT  Pager: [phone] Phone: [phone] 11/04/2015, 11:37 AM

## 2015-11-04 NOTE — Progress Notes (Signed)
   11/04/15 0625  Vitals  Temp 97.6 F (36.4 C)  Temp Source Oral  BP (!) 181/59 mmHg  BP Location Right Arm  BP Method Automatic  Patient Position (if appropriate) Lying  Pulse Rate (!) 47  Pulse Rate Source Dinamap  Resp 18  Oxygen Therapy  SpO2 98 %  O2 Device Room Air  Height and Weight  Weight 87 kg (191 lb 12.8 oz)  Type of Scale Used Standing  Type of Weight Actual  pt given 10mg  of IV hydralazine. BP will be recheck.

## 2015-11-04 NOTE — Progress Notes (Signed)
ANTICOAGULATION CONSULT NOTE -Follow up  Pharmacy Consult for Coumadin Indication: h/o atrial fibrillation  Allergies  Allergen Reactions  . 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 Patient subsequently taken off amiodarone.  . Nifedipine Other (See Comments)    Reaction: made gums swell up. Pt states that he had to have gum surgery after taking.     Patient Measurements: Height: 5\' 6"  (167.6 cm) Weight: 191 lb 12.8 oz (87 kg) IBW/kg (Calculated) : 63.8  Vital Signs: Temp: 97.6 F (36.4 C) (06/17 1127) Temp Source: Oral (06/17 1127) BP: 169/53 mmHg (06/17 1127) Pulse Rate: 56 (06/17 1127)  Labs:  Recent Labs  11/02/15 0617 11/03/15 0429 11/04/15 0300  HGB  --  12.0*  --   HCT  --  37.9*  --   PLT  --  234  --   LABPROT 25.1* 23.4* 23.9*  INR 2.31* 2.09* 2.16*  CREATININE  --  1.75*  --     Estimated Creatinine Clearance: 34.2 mL/min (by C-G formula based on Cr of 1.75).   Medical History: Past Medical History  Diagnosis Date  . Essential hypertension   . Type II diabetes mellitus ([place])   . Pneumonia 2007  . Cervical disc disease   . Dyslipidemia   . CKD (chronic kidney disease), stage III   . GERD (gastroesophageal reflux disease)   . PAF (paroxysmal atrial fibrillation) ([place])     a. Dx 02/2015 - in and out on tele, rx'd Coumadin and amiodarone.  . Chronic diastolic CHF (congestive heart failure) ([place])     a. Dx 02/2015 -  2D echo 03/02/15 showed severe focal basal hypertrophy of the septum, EF 55-60%, mild MR, no effusion.  . Coronary artery calcification seen on CT scan     a. Nuc 02/2015 was normal.  . Bradycardia     a. during 02/2015 admission - occasional HR in 40s while being treated for atrial fib.  . 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   Current Outpatient Prescriptions on File Prior to Encounter  Medication Sig Dispense Refill  . atorvastatin (LIPITOR) 80 MG tablet Take 40 mg by mouth daily.  2  . Cholecalciferol (VITAMIN D-3) 1000 UNITS CAPS Take 1 capsule by mouth daily.    . furosemide (LASIX) 40 MG tablet Take 40 mg by mouth daily.    Marland Kitchen lisinopril (PRINIVIL,ZESTRIL) 10 MG tablet Take 1 tablet (10 mg total) by mouth daily.    . metoprolol (LOPRESSOR) 50 MG tablet Take 50 mg by mouth 2 (two) times daily.    Marland Kitchen omeprazole (PRILOSEC) 20 MG capsule Take 20 mg by mouth daily.     . vitamin B-12 (CYANOCOBALAMIN) 1000 MCG tablet Take 1,000 mcg by mouth daily.    . insulin detemir (LEVEMIR) 100 UNIT/ML injection Inject 0.1 mLs (10 Units total) into the skin at bedtime. (Patient taking differently: Inject 25-35 Units into the skin See admin instructions. 35 units in the morning and 25 units at night) 10 mL 11   Assessment: 82 y.o. male admitted 10/30/15 with SOB/acute bronchitis, h/o Afib, continues Coumadin.  PTA dose: 3mg  daily and last taken PTA on 10/29/15.  INR on admit date 6/12 was 2.16. INR remains therapeutic today. CBC stable. No bleeding noted.    Goal of  Therapy:  INR 2-3 Monitor platelets by anticoagulation protocol: Yes   Plan:  Give Coumadin 3 mg today x1 Daily INR  Joya San, PharmD Clinical Pharmacy Resident Pager # [phone] 11/04/2015 1:09 PM

## 2015-11-04 NOTE — Progress Notes (Signed)
Patient ID: Justin Leach, male   DOB: July 23, 1931, 82 y.o.   MRN: [id number]   PROGRESS NOTE    Justin Leach  [id number] DOB: 1931/06/09 DOA: 10/30/2015  PCP: Irven Shelling, MD   Brief Narrative:  82 year old male with atrial fibrillation, diastolic congestive heart failure, & chronic renal failure, presented to [facility] ED for evaluation of several months duration of progressively worsening dyspnea, cough productive of greenish sputum, malaise.   Assessment & Plan:   Dyspnea secondary to ? Acute Bronchitis and ILD exacerbation  - pt reports persistent dyspnea, despite ABX - PCCM consulted and started on prednisone.  Pt has follow up with his outpatient pulmonologist arranged.  - continue Zithromax and Rocephin day #5/5 - Prednisone 20 mg PO QD per PCCM  - assess oxygen saturation with ambulation  - Endobronchial lesion in bronchus intermedius on HRCT - may be mucus but will need bronchoscopy at some point to clarify - plan for outpt follow up   ILD - per PCCM, follow, ANA, rheumatoid factor, and anti-CCP.  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  DVT prophylaxis: Coumadin  Code Status: Full  Family Communication: Patient at bedside  Disposition Plan: Home 1-2 days   Consultants:   PCCM  Procedures and recent  studies: TTE (03/02/15): LV normal in size with severe focal basal septal hypertrophy. EF 55-60%. Normal wall motion. Unable to assess diastolic dysfunction due to atrial fibrillation. LA & RA normal in size. RV normal in size and function. No aortic stenosis or regurgitation. Normal aortic root size. Trivial mitral regurgitation without stenosis. No pulmonic stenosis. Mild tricuspid regurgitation. Pulmonary artery normal in size. No pericardial effusion.  PFT (04/26/15): FVC 1.59 L (82%) FEV1 1.25 L (89%) FEV1/FVC 0.79 FEF 25-75 1.28 L (93%) negative bronchodilator response TLC 2.69 L (45%) RV 55% ERV 38% DLCO uncorrected 53%  HRCT CHEST W/O 06/12/15 No pleural effusion or thickening. No pathologic mediastinal adenopathy or mediastinal mass. No pericardial effusion. 7 mm nodule in right lower lobe noted. No traction bronchiectasis or honeycomb changes. Predominantly dependent subpleural reticular changes and small amount of groundglass opacification. This appears more prevalent within the lower lung zones.  Antimicrobials:   Zithromax 6/12 -->6/17  Rocephin 6/12 --> 6/17  Subjective: Pt having significant SOB with ambulation.   Objective: Filed Vitals:   11/03/15 2156 11/04/15 0625 11/04/15 1051 11/04/15 1127  BP: 164/48 181/59 154/75 169/53  Pulse: 57 47 59 56  Temp: 97.6 F (36.4 C) 97.6 F (36.4 C)  97.6 F (36.4 C)  TempSrc: Oral Oral  Oral  Resp: 19 18 20 18   Height:      Weight:  191 lb 12.8 oz (87 kg)    SpO2: 96% 98% 98% 98%    Intake/Output Summary (Last 24 hours) at 11/04/15 1537 Last data filed at 11/04/15 1300  Gross  per 24 hour  Intake   1280 ml  Output   1250 ml  Net     30 ml   Filed Weights   11/02/15 0551 11/03/15 0640 11/04/15 0625  Weight: 182 lb 4.8 oz (82.691 kg) 191 lb 4.8 oz (86.773 kg) 191 lb 12.8 oz (87 kg)    Examination:  General exam: Appears calm and comfortable  Respiratory system: Clear to auscultation. Respiratory effort normal. Bibasilar  wheezing heard.  Cardiovascular system: S1 & S2 heard, RRR. No JVD, murmurs, rubs, gallops or clicks. No pedal edema. Gastrointestinal system: Abdomen is nondistended, soft and nontender. No organomegaly or masses felt.  Central nervous system: Alert and oriented. No focal neurological deficits.  Data Reviewed: I have personally reviewed following labs and imaging studies  CBC:  Recent Labs Lab 10/30/15 1739 10/31/15 0538 11/01/15 0327 11/03/15 0429  WBC 17.5* 13.0* 14.5* 12.0*  NEUTROABS  --  10.4*  --   --   HGB 13.5 11.3* 12.5* 12.0*  HCT 42.6 36.0* 39.8 37.9*  MCV 92.8 92.1 93.6 92.2  PLT 210 181 225 [id number]   Basic Metabolic Panel:  Recent Labs Lab 10/30/15 1739 10/31/15 0538 11/01/15 0846 11/03/15 0429  NA 137 137 137 136  K 5.0 4.7 4.3 5.8*  CL 100* 101 103 104  CO2 27 30 28 28   GLUCOSE 164* 178* 292* 212*  BUN 37* 45* 45* 40*  CREATININE 2.37* 2.44* 2.16* 1.75*  CALCIUM 9.4 8.7* 8.6* 9.0   Liver Function Tests:  Recent Labs Lab 10/31/15 0538  AST 23  ALT 20  ALKPHOS 80  BILITOT 0.5  PROT 5.2*  ALBUMIN 2.6*   Coagulation Profile:  Recent Labs Lab 10/31/15 0538 11/01/15 0327 11/02/15 0617 11/03/15 0429 11/04/15 0300  INR 2.27* 1.99* 2.31* 2.09* 2.16*   Cardiac Enzymes:  Recent Labs Lab 10/30/15 2355 10/31/15 0536 10/31/15 1156  TROPONINI 0.04* 0.05* 0.05*   CBG:  Recent Labs Lab 11/04/15 0628 11/04/15 0731 11/04/15 0759 11/04/15 0838 11/04/15 1127  GLUCAP 30* 53* 65 99 193*   Urine analysis:    Component Value Date/Time   COLORURINE YELLOW 10/31/2015 0700   APPEARANCEUR CLEAR 10/31/2015 0700   LABSPEC 1.021 10/31/2015 0700   PHURINE 5.0 10/31/2015 0700   GLUCOSEU NEGATIVE 10/31/2015 0700   HGBUR NEGATIVE 10/31/2015 0700   BILIRUBINUR NEGATIVE 10/31/2015 0700   KETONESUR NEGATIVE 10/31/2015 0700   PROTEINUR 100* 10/31/2015 0700   UROBILINOGEN 0.2 05/11/2013 1632   NITRITE NEGATIVE 10/31/2015 0700   LEUKOCYTESUR NEGATIVE  10/31/2015 0700   Radiology Studies: No results found.  Scheduled Meds: . atorvastatin  40 mg Oral Daily  . insulin aspart  0-15 Units Subcutaneous TID WC  . insulin aspart  3 Units Subcutaneous TID WC  . insulin aspart  3 Units Subcutaneous Once  . insulin detemir  15 Units Subcutaneous QHS  . metoprolol  50 mg Oral BID  . pantoprazole  40 mg Oral Daily  . predniSONE  20 mg Oral Q breakfast  . sodium chloride flush  3 mL Intravenous Q12H  . warfarin  3 mg Oral ONCE-1800  . Warfarin - Pharmacist Dosing Inpatient   Does not apply q1800    Time spent: 22 minutes   Irwin Brakeman, MD [facility] Pager [phone]  If 7PM-7AM, please contact night-coverage [email or web address] Password [facility] 11/04/2015, 3:37 PM

## 2015-11-04 NOTE — Progress Notes (Signed)
Hypoglycemic Event  CBG: 26 rechecked is 30  Treatment: 2 graham crackers and peanut butter Symptoms: none  Follow-up CBG: [id number] CBG Result: 53  Possible Reasons for Event: levemir 25 units last night  Comments/MD notified:hypoglycemia protocol followed    Justin Leach

## 2015-11-04 NOTE — Progress Notes (Signed)
CARDIAC REHAB PHASE I   Spoke with patient about Pulmonary Rehab. Patient states that he is not able to do it at this time because he does not have transportation. His wife is his main source of transportation and she works from [employer] everyday. He states that when he gets strong enough and can drive again, he will consider it. I gave him a handout. Reviewed sodium intake and daily weights with patient. Patient was eating lunch and stated that he wants to wait and go for a walk later this afternoon.   [place], [place] 11/04/2015 1:23 PM

## 2015-11-05 DIAGNOSIS — R05 Cough: Secondary | ICD-10-CM | POA: Diagnosis not present

## 2015-11-05 DIAGNOSIS — J40 Bronchitis, not specified as acute or chronic: Secondary | ICD-10-CM | POA: Diagnosis not present

## 2015-11-05 DIAGNOSIS — I5032 Chronic diastolic (congestive) heart failure: Secondary | ICD-10-CM | POA: Diagnosis not present

## 2015-11-05 DIAGNOSIS — N183 Chronic kidney disease, stage 3 (moderate): Secondary | ICD-10-CM | POA: Diagnosis not present

## 2015-11-05 LAB — PROTIME-INR
INR: 2.49 — ABNORMAL HIGH (ref 0.00–1.49)
Prothrombin Time: 26.6 seconds — ABNORMAL HIGH (ref 11.6–15.2)

## 2015-11-05 LAB — GLUCOSE, CAPILLARY
GLUCOSE-CAPILLARY: 217 mg/dL — AB (ref 65–99)
GLUCOSE-CAPILLARY: 258 mg/dL — AB (ref 65–99)
Glucose-Capillary: 179 mg/dL — ABNORMAL HIGH (ref 65–99)
Glucose-Capillary: 121 mg/dL — ABNORMAL HIGH (ref 65–99)
Glucose-Capillary: 222 mg/dL — ABNORMAL HIGH (ref 65–99)
Glucose-Capillary: 246 mg/dL — ABNORMAL HIGH (ref 65–99)

## 2015-11-05 MED ORDER — METOPROLOL TARTRATE 50 MG PO TABS
50.0000 mg | ORAL_TABLET | Freq: Once | ORAL | Status: AC
  Administered 2015-11-05: 50 mg via ORAL
  Filled 2015-11-05: qty 1

## 2015-11-05 MED ORDER — GUAIFENESIN-CODEINE 100-10 MG/5ML PO SOLN: 10.0000 mL | ORAL | Status: DC | PRN

## 2015-11-05 MED ORDER — PREDNISONE 20 MG PO TABS: 20.0000 mg | ORAL_TABLET | Freq: Every day | ORAL | Status: DC

## 2015-11-05 MED ORDER — WARFARIN SODIUM 3 MG PO TABS
3.0000 mg | ORAL_TABLET | Freq: Once | ORAL | Status: AC
  Administered 2015-11-05: 3 mg via ORAL
  Filled 2015-11-05: qty 1

## 2015-11-05 MED ORDER — HYDRALAZINE HCL 10 MG PO TABS
10.0000 mg | ORAL_TABLET | Freq: Once | ORAL | Status: AC
  Administered 2015-11-05: 10 mg via ORAL
  Filled 2015-11-05: qty 1

## 2015-11-05 NOTE — Progress Notes (Signed)
Patient is discharge to home accompanied by patient's spouse and NT via wheelchair. Discharge instructions given . Patient verbalizes understanding. All personal belongings given. Telemetry box and IV removed prior to discharge and site in good condition.  

## 2015-11-05 NOTE — Discharge Instructions (Signed)
Monitor blood sugars 5 times per day for next 7-10 days and report to primary care provider Have PT (coumadin) rechecked in 1 week with primary care provider.  Discuss your heart medications with your primary care provider or cardiologist in 1 week Return for any new problems.  Follow up with pulmonologist as scheduled to discuss bronchoscopy testing.     Cough, Adult Coughing is a reflex that clears your throat and your airways. Coughing helps to heal and protect your lungs. It is normal to cough occasionally, but a cough that happens with other symptoms or lasts a long time may be a sign of a condition that needs treatment. A cough may last only 2-3 weeks (acute), or it may last longer than 8 weeks (chronic). CAUSES Coughing is commonly caused by:  Breathing in substances that irritate your lungs.  A viral or bacterial respiratory infection.  Allergies.  Asthma.  Postnasal drip.  Smoking.  Acid backing up from the stomach into the esophagus (gastroesophageal reflux).  Certain medicines.  Chronic lung problems, including COPD (or rarely, lung cancer).  Other medical conditions such as heart failure. HOME CARE INSTRUCTIONS  Pay attention to any changes in your symptoms. Take these actions to help with your discomfort:  Take medicines only as told by your health care provider.  If you were prescribed an antibiotic medicine, take it as told by your health care provider. Do not stop taking the antibiotic even if you start to feel better.  Talk with your health care provider before you take a cough suppressant medicine.  Drink enough fluid to keep your urine clear or pale yellow.  If the air is dry, use a cold steam vaporizer or humidifier in your bedroom or your home to help loosen secretions.  Avoid anything that causes you to cough at work or at home.  If your cough is worse at night, try sleeping in a semi-upright position.  Avoid cigarette smoke. If you smoke, quit  smoking. If you need help quitting, ask your health care provider.  Avoid caffeine.  Avoid alcohol.  Rest as needed. SEEK MEDICAL CARE IF:   You have new symptoms.  You cough up pus.  Your cough does not get better after 2-3 weeks, or your cough gets worse.  You cannot control your cough with suppressant medicines and you are losing sleep.  You develop pain that is getting worse or pain that is not controlled with pain medicines.  You have a fever.  You have unexplained weight loss.  You have night sweats. SEEK IMMEDIATE MEDICAL CARE IF:  You cough up blood.  You have difficulty breathing.  Your heartbeat is very fast.   This information is not intended to replace advice given to you by your health care provider. Make sure you discuss any questions you have with your health care provider.   Document Released: 11/02/2010 Document Revised: 01/25/2015 Document Reviewed: 07/13/2014 Elsevier Interactive Patient Education 2016 Else[employer] Inc[employer] Hypoglycemia Low blood sugar (hypoglycemia) means that the level of sugar in your blood is lower than it should be. Signs of low blood sugar include:  Getting sweaty.  Feeling hungry.  Feeling dizzy or weak.  Feeling sleepier than normal.  Feeling nervous.  Headaches.  Having a fast heartbeat. Low blood sugar can happen fast and can be an emergency. Your doctor can do tests to check your blood sugar level. You can have low blood sugar and not have diabetes. HOME CARE  Check your blood sugar as told  by your doctor. If it is less than 70 mg/dl or as told by your doctor, take 1 of the following:  3 to 4 glucose tablets.   cup clear juice.   cup soda pop, not diet.  1 cup milk.  5 to 6 hard candies.  Recheck blood sugar after 15 minutes. Repeat until it is at the right level.  Eat a snack if it is more than 1 hour until the next meal.  Only take medicine as told by your doctor.  Do not skip meals. Eat on  time.  Do not drink alcohol except with meals.  Check your blood glucose before driving.  Check your blood glucose before and after exercise.  Always carry treatment with you, such as glucose pills.  Always wear a medical alert bracelet if you have diabetes. GET HELP RIGHT AWAY IF:   Your blood glucose goes below 70 mg/dl or as told by your doctor, and you:  Are confused.  Are not able to swallow.  Pass out (faint).  You cannot treat yourself. You may need someone to help you.  You have low blood sugar problems often.  You have problems from your medicines.  You are not feeling better after 3 to 4 days.  You have vision changes. MAKE SURE YOU:   Understand these instructions.  Will watch this condition.  Will get help right away if you are not doing well or get worse.   This information is not intended to replace advice given to you by your health care provider. Make sure you discuss any questions you have with your health care provider.   Document Released: 07/31/2009 Document Revised: 05/27/2014 Document Reviewed: 01/10/2015 Elsevier Interactive Patient Education 2016 [employer]vier Inc[employer] Blood Glucose Monitoring, Adult Monitoring your blood glucose (also know as blood sugar) helps you to manage your diabetes. It also helps you and your health care provider monitor your diabetes and determine how well your treatment plan is working. WHY SHOULD YOU MONITOR YOUR BLOOD GLUCOSE?  It can help you understand how food, exercise, and medicine affect your blood glucose.  It allows you to know what your blood glucose is at any given moment. You can quickly tell if you are having low blood glucose (hypoglycemia) or high blood glucose (hyperglycemia).  It can help you and your health care provider know how to adjust your medicines.  It can help you understand how to manage an illness or adjust medicine for exercise. WHEN SHOULD YOU TEST? Your health care provider will help you  decide how often you should check your blood glucose. This may depend on the type of diabetes you have, your diabetes control, or the types of medicines you are taking. Be sure to write down all of your blood glucose readings so that this information can be reviewed with your health care provider. See below for examples of testing times that your health care provider may suggest. Type 1 Diabetes  Test at least 2 times per day if your diabetes is well controlled, if you are using an insulin pump, or if you perform multiple daily injections.  If your diabetes is not well controlled or if you are sick, you may need to test more often.  It is a good idea to also test:  Before every insulin injection.  Before and after exercise.  Between meals and 2 hours after a meal.  Occasionally between 2:00 a.m. and 3:00 a.m. Type 2 Diabetes  If you are taking insulin, test at  least 2 times per day. However, it is best to test before every insulin injection.  If you take medicines by mouth (orally), test 2 times a day.  If you are on a controlled diet, test once a day.  If your diabetes is not well controlled or if you are sick, you may need to monitor more often. HOW TO MONITOR YOUR BLOOD GLUCOSE Supplies Needed  Blood glucose meter.  Test strips for your meter. Each meter has its own strips. You must use the strips that go with your own meter.  A pricking needle (lancet).  A device that holds the lancet (lancing device).  A journal or log book to write down your results. Procedure  Wash your hands with soap and water. Alcohol is not preferred.  Prick the side of your finger (not the tip) with the lancet.  Gently milk the finger until a small drop of blood appears.  Follow the instructions that come with your meter for inserting the test strip, applying blood to the strip, and using your blood glucose meter. Other Areas to Get Blood for Testing Some meters allow you to use other areas  of your body (other than your finger) to test your blood. These areas are called alternative sites. The most common alternative sites are:  The forearm.  The thigh.  The back area of the lower leg.  The palm of the hand. The blood flow in these areas is slower. Therefore, the blood glucose values you get may be delayed, and the numbers are different from what you would get from your fingers. Do not use alternative sites if you think you are having hypoglycemia. Your reading will not be accurate. Always use a finger if you are having hypoglycemia. Also, if you cannot feel your lows (hypoglycemia unawareness), always use your fingers for your blood glucose checks. ADDITIONAL TIPS FOR GLUCOSE MONITORING  Do not reuse lancets.  Always carry your supplies with you.  All blood glucose meters have a 24-hour "hotline" number to call if you have questions or need help.  Adjust (calibrate) your blood glucose meter with a control solution after finishing a few boxes of strips. BLOOD GLUCOSE RECORD KEEPING It is a good idea to keep a daily record or log of your blood glucose readings. Most glucose meters, if not all, keep your glucose records stored in the meter. Some meters come with the ability to download your records to your home computer. Keeping a record of your blood glucose readings is especially helpful if you are wanting to look for patterns. Make notes to go along with the blood glucose readings because you might forget what happened at that exact time. Keeping good records helps you and your health care provider to work together to achieve good diabetes management.    This information is not intended to replace advice given to you by your health care provider. Make sure you discuss any questions you have with your health care provider.   Document Released: 05/09/2003 Document Revised: 05/27/2014 Document Reviewed: 09/28/2012 Elsevier Interactive Patient Education 2016 Elsevier  Inc[employer]  Pulmonary Function Tests Pulmonary function tests (PFTs) measure how well your lungs are working. The tests can help to identify the causes of lung problems. They can also help your health care provider select the best treatment for you. Your health care provider may order pulmonary function for any of the following reasons:  When an illness involving the lungs is suspected.  To follow changes in your lung function  over time if you are known to have a chronic lung disease.  For industrial plant workers to examine the effects of being exposed to chemicals over a long period of time.  To assess lung function prior to surgery or other procedures.  For people who are smokers. Your measured lung function will be compared to the expected lung function of someone with healthy lungs who is similar to you in age, gender, size, and other factors. This is used to determine your "percent predicted" lung function, which is how your health care provider knows if your lung function is normal or abnormal. If you have had prior pulmonary function testing performed, your health care provider will also compare your current results with past tests to see if your lung function is better, worse, or staying the same. This can sometimes be useful to see if treatments are working.  LET [facility] CARE PROVIDER KNOW ABOUT:  Any allergies you have.  All medicines you are taking, including inhaler or nebulizer medicines, vitamins, herbs, eye drops, creams, and over-the-counter medicines.  Any blood disorders you have.  Previous surgeries you have had, especially recent eye surgery, abdominal surgery, or chest surgery. These can make performing pulmonary function tests difficult or unsafe.  Medical conditions you have.  Chest pain or heart problems.  Tuberculosis or respiratory infections, such as pneumonia, a cold, or the flu. If you think you will have difficulty performing any of the breathing maneuvers,  ask your health care provider if you should reschedule the test. RISKS AND COMPLICATIONS: Generally, pulmonary function testing is a safe procedure. However, as with any procedure, complications can occur. Possible complications include:  Lightheadedness due to overbreathing (hyperventilation).  An asthmatic attack from deep breathing. BEFORE THE PROCEDURE  Take medicine as directed by your health care provider. If you take inhaler or nebulizer medicines, ask your health care provider which medicines you should take on the day of your test. Some inhaler medicines may interfere with pulmonary function tests, such as bronchodilator testing, if taken shortly before the test.  Avoid eating a large meal before your test.  Do not smoke before your test.  Wear comfortable clothing which will not interfere with breathing. PROCEDURE  You will be given a soft nose clip to wear during the procedure. This is done so that all of your breaths will go through your mouth instead of your nose.  You will be given a germ-free (sterile) mouthpiece. It will be attached to a spirometer. The spirometer is the machine that measures your breathing.  You will be instructed to perform various breathing maneuvers. The maneuvers will be done by breathing in (inhaling) and breathing out (exhaling). Depending on what measurements are ordered, you may be asked to repeat the maneuvers several times before the test is completed.  It is important to follow the instructions exactly to obtain accurate results. Make sure to blow as hard and as fast as you can when you are instructed to do so.  You may be given a bronchodilator after testing has been performed. A bronchodilator is a medicine which makes the small air passages in your lungs larger. These medicines usually make it easier to breathe. The tests are then repeated several minutes later after the bronchodilator has taken effect.  You will be monitored carefully  during the procedure for faintness, dizziness, difficulty breathing, or any other problems. AFTER THE PROCEDURE   You may resume your usual diet, medicines, and activities as directed by your health care  provider.  Your health care provider will go over your test results with you and determine what treatments may be helpful.   This information is not intended to replace advice given to you by your health care provider. Make sure you discuss any questions you have with your health care provider.   Document Released: 12/28/2003 Document Revised: 02/24/2013 Document Reviewed: 12/03/2012 Elsevier Interactive Patient Education 2016 [employer].  Flexible Bronchoscopy Bronchoscopy is a procedure used to examine the passageways in the lungs. During the procedure a thin, flexible tool with a lens and camera or eyepiece is passed in your mouth or nose, down the windpipe (trachea), and into the air tubes (bronchi). This tool allows your health care provider to carefully look at your lungs from the inside and take diagnostic samples if needed.  LET [facility] CARE PROVIDER KNOW ABOUT:   Allergies to food or medicine.   All medicines you are taking, including blood thinners, vitamins, herbs, eye drops, creams, and over-the-counter medicines.   Previous problems you or members of your family have had with the use of anesthetics.   Any blood disorders you have.   Previous surgeries you have had.   Medical conditions you have, including heart disease, diabetes, or kidney problems.   Possibility of pregnancy, if this applies. RISKS AND COMPLICATIONS Generally, this is a safe procedure. However, as with any procedure, problems can occur. Possible problems include:   Collapsed lung (pneumothorax).  Bleeding.  Increased need for oxygen or difficulty breathing after the procedure. BEFORE THE PROCEDURE  Do not eat or drink anything after midnight on the night before the procedure or as  directed by your health care provider.  PROCEDURE   Relax as much as possible during the procedure.  Medicines may be given to relax you, dry up your secretions, and control coughing.   A numbing medicine (local anesthetic) will be given to numb your mouth, nose, throat, and voice box (larynx). You will be able to breathe normally during the procedure.   Samples of airway secretions may be collected for testing.  If abnormal areas are seen in your airways, tissue samples may be taken for examination under a microscope (biopsy).  If tissue samples are needed from the outer portions of the lung, a type of X-ray called fluoroscopy may be done.   If bleeding occurs, a drug may be used to stop or decrease the bleeding.  AFTER THE PROCEDURE   You may receive a chest X-ray following the procedure. This is to make sure the lungs have not collapsed (pneumothorax).    This information is not intended to replace advice given to you by your health care provider. Make sure you discuss any questions you have with your health care provider.   Document Released: 05/03/2000 Document Revised: 01/25/2015 Document Reviewed: 01/08/2013 Elsevier Interactive Patient Education [employer].

## 2015-11-05 NOTE — Progress Notes (Signed)
ANTICOAGULATION CONSULT NOTE -Follow up  Pharmacy Consult for Coumadin Indication: h/o atrial fibrillation  Allergies  Allergen Reactions  . 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 Patient subsequently taken off amiodarone.  . Nifedipine Other (See Comments)    Reaction: made gums swell up. Pt states that he had to have gum surgery after taking.     Patient Measurements: Height: 5\' 6"  (167.6 cm) Weight: 194 lb 8 oz (88.225 kg) (scale c) IBW/kg (Calculated) : 63.8  Vital Signs: Temp: 97.7 F (36.5 C) (06/18 1130) Temp Source: Oral (06/18 1130) BP: 172/52 mmHg (06/18 1130) Pulse Rate: 59 (06/18 1130)  Labs:  Recent Labs  11/03/15 0429 11/04/15 0300 11/05/15 0244  HGB 12.0*  --   --   HCT 37.9*  --   --   PLT 234  --   --   LABPROT 23.4* 23.9* 26.6*  INR 2.09* 2.16* 2.49*  CREATININE 1.75*  --   --     Estimated Creatinine Clearance: 34.5 mL/min (by C-G formula based on Cr of 1.75).   Medical History: Past Medical History  Diagnosis Date  . Essential hypertension   . Type II diabetes mellitus ([place])   . Pneumonia 2007  . Cervical disc disease   . Dyslipidemia   . CKD (chronic kidney disease), stage III   . GERD (gastroesophageal reflux disease)   . PAF (paroxysmal atrial fibrillation) ([place])     a. Dx 02/2015 - in and out on tele, rx'd Coumadin and amiodarone.  . Chronic diastolic CHF (congestive heart failure) ([place])     a. Dx 02/2015 -  2D echo 03/02/15 showed severe focal basal hypertrophy of the septum, EF 55-60%, mild MR, no effusion.  . Coronary artery calcification seen on CT scan     a. Nuc 02/2015 was normal.  . Bradycardia     a. during 02/2015 admission - occasional HR in 40s while being treated for atrial fib.  . 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   Current Outpatient Prescriptions on File Prior to Encounter  Medication Sig Dispense Refill  . atorvastatin (LIPITOR) 80 MG tablet Take 40 mg by mouth daily.  2  . Cholecalciferol (VITAMIN D-3) 1000 UNITS CAPS Take 1 capsule by mouth daily.    . furosemide (LASIX) 40 MG tablet Take 40 mg by mouth daily.    Marland Kitchen lisinopril (PRINIVIL,ZESTRIL) 10 MG tablet Take 1 tablet (10 mg total) by mouth daily.    . metoprolol (LOPRESSOR) 50 MG tablet Take 50 mg by mouth 2 (two) times daily.    Marland Kitchen omeprazole (PRILOSEC) 20 MG capsule Take 20 mg by mouth daily.     . vitamin B-12 (CYANOCOBALAMIN) 1000 MCG tablet Take 1,000 mcg by mouth daily.     Assessment: 82 y.o. male admitted 10/30/15 with SOB/acute bronchitis, h/o Afib, continues Coumadin.  PTA dose: 3mg  daily and last taken PTA on 10/29/15.  INR on admit date 6/12 was 2.16. INR remains therapeutic today. No bleeding noted.    Goal of Therapy:  INR 2-3 Monitor platelets by anticoagulation protocol: Yes   Plan:  Give Coumadin 3 mg today x1 Daily INR  Joya San, PharmD Clinical Pharmacy Resident Pager # [phone] 11/05/2015 1:39 PM

## 2015-11-05 NOTE — Discharge Summary (Addendum)
Physician Discharge Summary  KSHAUN GARGAN [id number] DOB: [birth year] DOA: 10/30/2015  PCP: Irven Shelling, MD  Admit date: 10/30/2015 Discharge date: 11/05/2015  Admitted From: Home Disposition: Home  Recommendations for Outpatient Follow-up:  1. Follow up with pulmonologist as scheduled to discuss bronchoscopy, see PCP in 1 week to review blood pressure and adjust meds as needed and monitor PT/INR 2. Please obtain BMP/CBC/PTINR in one week with PCP  Discharge Condition:Stable CODE STATUS: FULL Diet recommendation: Heart Healthy / Carb Modified   Brief/Interim Summary: Brief Narrative:  82 year old male with atrial fibrillation, diastolic congestive heart failure, & chronic renal failure, presented to [facility] ED for evaluation of several months duration of progressively worsening dyspnea, cough productive of greenish sputum, malaise.   Assessment & Plan:  Dyspnea secondary to ? Acute Bronchitis and ILD exacerbation  - pt reports persistent dyspnea, despite ABX - PCCM consulted and started on prednisone. Pt has follow up with his outpatient pulmonologist arranged.  - completed Zithromax and Rocephin day #5/5 - Prednisone 20 mg PO QD per PCCM  - Endobronchial lesion in bronchus intermedius on HRCT - may be mucus but will need bronchoscopy at some point to clarify - plan for outpt follow up as scheduled   ILD - per PCCM, follow, ANA, rheumatoid factor, and anti-CCP. Follow up with pulmonologist outpatient as scheduled.  - plan to continue Prednisone 20 mg for several more days.    GERD/ Dysphagia, presbyesophagus  - Continue PPI  Acute on chronic kidney disease, stage III - Cr trending down from 2.4 --> 2.1 ---> 1.75 - monitor renal function  - resuming lisinopril, discontinued lasix.  Follow up with PCP this week for recheck.   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  Atrial fib, CHADS 2 score 4 - continue Coumadin , PTINR therapeutic at discharge, follow up testing with PCP - rate controlled  Hypertension - suboptimal control.  Pt to followup with PCP within a week to recheck and adjust meds as needed.   Obesity  - Body mass index is 30.12 kg/(m^2).  Discharge Diagnoses:  Active Problems:   Chronic diastolic CHF (congestive heart failure) (HCC)   PAF (paroxysmal atrial fibrillation) (HCC)   CKD (chronic kidney disease), stage III   Type II diabetes mellitus (HCC)   Essential hypertension   Bronchitis   Pulmonary fibrosis (HCC)   Cough   GERD (gastroesophageal reflux disease)  Discharge Instructions     Discharge Instructions    Increase activity slowly    Complete by:  As directed             Medication List    STOP taking these medications        furosemide 40 MG tablet  Commonly known as:  LASIX      TAKE these medications        atorvastatin 80 MG tablet  Commonly known as:  LIPITOR  Take 40 mg by mouth daily.     guaiFENesin-codeine 100-10 MG/5ML syrup  Take 10 mLs by mouth every 4 (four) hours as needed for cough.     insulin aspart 100 UNIT/ML injection  Commonly known as:  novoLOG  Inject 0-5 Units into the skin 3 (three) times daily before meals. Sliding scale.  insulin detemir 100 UNIT/ML injection  Commonly known as:  LEVEMIR  Inject 20-40 Units into the skin at bedtime. 40 units in the morning and 20 units in the evening. ( depending on how pt feels at the time of dose)     lisinopril 10 MG tablet  Commonly known as:  PRINIVIL,ZESTRIL  Take 1 tablet (10 mg total) by mouth daily.     metoprolol 50 MG tablet  Commonly known as:  LOPRESSOR  Take 50 mg by mouth 2 (two) times daily.     omeprazole 20 MG  capsule  Commonly known as:  PRILOSEC  Take 20 mg by mouth daily.     predniSONE 20 MG tablet  Commonly known as:  DELTASONE  Take 1 tablet (20 mg total) by mouth daily with breakfast.     vitamin B-12 1000 MCG tablet  Commonly known as:  CYANOCOBALAMIN  Take 1,000 mcg by mouth daily.     Vitamin D-3 1000 units Caps  Take 1 capsule by mouth daily.     warfarin 3 MG tablet  Commonly known as:  COUMADIN  Take 3 mg by mouth daily.       Follow-up Information    Follow up with Marshell [name], MD On 11/17/2015.   Specialty:  Pulmonary Disease   Why:  4:15 pm   Contact information:   [address] [place] [address] [phone]       Follow up with Irven Shelling, MD. Schedule an appointment as soon as possible for a visit in 1 week.   Specialty:  Internal Medicine   Contact information:   [address] [place] [address] [phone]      Allergies  Allergen Reactions  . 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 Patient subsequently taken off amiodarone.  . Nifedipine Other (See Comments)    Reaction: made gums swell up. Pt states that he had to have gum surgery after taking.    Consultations:  Pulmonologist  Procedures/Studies: Dg Chest 2 View  10/30/2015  CLINICAL DATA:  Shortness of breath for 2 months EXAM: CHEST  2 VIEW COMPARISON:  06/12/2015 FINDINGS: Cardiac shadow is within normal limits. Lungs are clear bilaterally. Chronic changes are noted in the thoracic spine. No acute abnormality seen. IMPRESSION: No acute abnormality noted. Electronically Signed   By: Inez Catalina M.D.   On: 10/30/2015 16:01   Dg Esophagus  10/31/2015  CLINICAL DATA:  82 year old with occasional dysphagia. History of gastroesophageal reflux disease and shortness of breath. EXAM: ESOPHOGRAM/BARIUM SWALLOW TECHNIQUE: Single contrast examination was  performed using  thin barium. FLUOROSCOPY TIME:  Radiation Exposure Index (as provided by the fluoroscopic device): 190.65 uGy*m2 If the device does not provide the exposure index: Fluoroscopy Time:  1 minute and 18 seconds of pulsed fluoro Number of Acquired Images:  Fluoro only COMPARISON:  Chest CT 10/31/2015 FINDINGS: Study was performed in the erect and prone positions. The patient swallowed the barium without difficulty. Rapid sequence imaging of the pharynx in the lateral and AP projections demonstrates no mucosal lesions or laryngeal penetration. There is mild esophageal dysmotility with a decreased primary stripping wave and increased tertiary contractions. No evidence of esophageal mass, stricture or ulceration. No significant hiatal hernia or reflux demonstrated. Water siphon test not performed as patient could not lie supine. A 13 mm barium tablet was administered and passed without delay  into the stomach. IMPRESSION: Mild presbyesophagus.  No anatomic abnormalities identified. Electronically Signed   By: Richardean Sale M.D.   On: 10/31/2015 11:18   Ct Chest High Resolution  10/31/2015  CLINICAL DATA:  82 year old male with progressively worsening cough and shortness of breath. Evaluate for potential nonspecific interstitial pneumonia (NSIP) versus amiodarone induced lung injury. EXAM: CT CHEST WITHOUT CONTRAST TECHNIQUE: Multidetector CT imaging of the chest was performed following the standard protocol without intravenous contrast. High resolution imaging of the lungs, as well as inspiratory and expiratory imaging, was performed. COMPARISON:  None. FINDINGS: Mediastinum/Lymph Nodes: Heart size is mildly enlarged. There is no significant pericardial fluid, thickening or pericardial calcification. There is atherosclerosis of the thoracic aorta, the great vessels of the mediastinum and the coronary arteries, including calcified atherosclerotic plaque in the left main, left anterior descending than  left circumflex coronary arteries. No pathologically enlarged mediastinal or hilar lymph nodes. Please note that accurate exclusion of hilar adenopathy is limited on noncontrast CT scans. Small hiatal hernia. Lungs/Pleura: High-resolution images demonstrate patchy areas of ground-glass attenuation and mild septal thickening, most evident in the lung bases. There are no areas of significant traction bronchiectasis or frank honeycombing. Inspiratory and expiratory imaging demonstrates some mild air trapping, indicative of small airways disease. No acute consolidative airspace disease. No pleural effusions. 7 x 5 mm (mean diameter 6 mm) subpleural nodule in the periphery of the right lower lobe (image 65 of series 5) is unchanged. There is a new elongated pleural-based density in the medial aspect of the left upper lobe (image 19 of series 5), which is presumably a focal area of atelectasis or developing scarring. Small amount of dependent atelectasis is also noted in the lower lobes of the lungs bilaterally. As noted on the prior examination, there is a small nodular prominence along the lateral wall of the proximal bronchus intermedius measuring approximately 6 mm (axial image 56 of series 5 and coronal image 53 of series 6). Upper abdomen: Status post cholecystectomy. Extensive atherosclerosis. Musculoskeletal: Orthopedic fixation hardware in the lower cervical spine incidentally noted. There are no aggressive appearing lytic or blastic lesions noted in the visualized portions of the skeleton. IMPRESSION: 1. The appearance of the lungs appears very similar to the prior examination and is favored to reflect mild nonspecific interstitial pneumonia (NSIP). 2. 6 mm endobronchial nodule associated with the lateral margin of the proximal bronchus intermedius, similar to the prior study. This could represent an endobronchial polyp or other neoplasm. Correlation with bronchoscopy for direct inspection and potential biopsy  is suggested if clinically appropriate. 3. Stable subpleural nodule with mean diameter of 6 mm in the periphery of the right lower lobe, favored to be a benign subpleural lymph node. Repeat CT at 18-24 months (from scan dated 06/12/2015) is considered optional for low-risk patients, but is recommended for high-risk patients. This recommendation follows the consensus statement: Guidelines for Management of Incidental Pulmonary Nodules Detected on CT Images:From the Fleischner Society 2017; published online before print (10.1148/radiol.IJ:2314499). 4. Atherosclerosis, including left main and 2 vessel coronary artery disease. 5. Small hiatal hernia. 6. Mild cardiomegaly. 7. Additional incidental findings, as above. Electronically Signed   By: Vinnie Langton M.D.   On: 10/31/2015 08:55    Subjective: Pt says that he feels well and would like to go home today.   Discharge Exam: Filed Vitals:   11/05/15 1014 11/05/15 1130  BP: 170/47 172/52  Pulse: 55 59  Temp: 97.4 F (36.3 C) 97.7 F (36.5 C)  Resp: 18 18   Filed Vitals:   11/05/15 0600 11/05/15 0651 11/05/15 1014 11/05/15 1130  BP: 175/54  170/47 172/52  Pulse: 54  55 59  Temp: 97.6 F (36.4 C)  97.4 F (36.3 C) 97.7 F (36.5 C)  TempSrc: Oral  Oral Oral  Resp: 20  18 18   Height:      Weight:  194 lb 8 oz (88.225 kg)    SpO2: 96%  96% 98%   General: Pt is alert, awake, not in acute distress Cardiovascular: RRR, S1/S2 +, no rubs, no gallops Respiratory: CTA bilaterally, no wheezing, no rhonchi Abdominal: Soft, NT, ND, bowel sounds + Extremities: no edema, no cyanosis   The results of significant diagnostics from this hospitalization (including imaging, microbiology, ancillary and laboratory) are listed below for reference.     Microbiology: No results found for this or any previous visit (from the past 240 hour(s)).   Labs: BNP (last 3 results)  Recent Labs  03/04/15 1100 08/10/15 1404 10/30/15 1939  BNP 904.6* 167.8*  99991111*   Basic Metabolic Panel:  Recent Labs Lab 10/30/15 1739 10/31/15 0538 11/01/15 0846 11/03/15 0429  NA 137 137 137 136  K 5.0 4.7 4.3 5.8*  CL 100* 101 103 104  CO2 27 30 28 28   GLUCOSE 164* 178* 292* 212*  BUN 37* 45* 45* 40*  CREATININE 2.37* 2.44* 2.16* 1.75*  CALCIUM 9.4 8.7* 8.6* 9.0   Liver Function Tests:  Recent Labs Lab 10/31/15 0538  AST 23  ALT 20  ALKPHOS 80  BILITOT 0.5  PROT 5.2*  ALBUMIN 2.6*   No results for input(s): LIPASE, AMYLASE in the last 168 hours. No results for input(s): AMMONIA in the last 168 hours. CBC:  Recent Labs Lab 10/30/15 1739 10/31/15 0538 11/01/15 0327 11/03/15 0429  WBC 17.5* 13.0* 14.5* 12.0*  NEUTROABS  --  10.4*  --   --   HGB 13.5 11.3* 12.5* 12.0*  HCT 42.6 36.0* 39.8 37.9*  MCV 92.8 92.1 93.6 92.2  PLT 210 181 225 234   Cardiac Enzymes:  Recent Labs Lab 10/30/15 2355 10/31/15 0536 10/31/15 1156  TROPONINI 0.04* 0.05* 0.05*   BNP: Invalid input(s): POCBNP CBG:  Recent Labs Lab 11/04/15 1127 11/04/15 1539 11/04/15 2059 11/05/15 0046 11/05/15 0627  GLUCAP 193* 286* 197* 258* 179*   D-Dimer No results for input(s): DDIMER in the last 72 hours. Hgb A1c No results for input(s): HGBA1C in the last 72 hours. Lipid Profile No results for input(s): CHOL, HDL, LDLCALC, TRIG, CHOLHDL, LDLDIRECT in the last 72 hours. Thyroid function studies No results for input(s): TSH, T4TOTAL, T3FREE, THYROIDAB in the last 72 hours.  Invalid input(s): FREET3 Anemia work up No results for input(s): VITAMINB12, FOLATE, FERRITIN, TIBC, IRON, RETICCTPCT in the last 72 hours. Urinalysis    Component Value Date/Time   COLORURINE YELLOW 10/31/2015 0700   APPEARANCEUR CLEAR 10/31/2015 0700   LABSPEC 1.021 10/31/2015 0700   PHURINE 5.0 10/31/2015 0700   GLUCOSEU NEGATIVE 10/31/2015 0700   HGBUR NEGATIVE 10/31/2015 0700   BILIRUBINUR NEGATIVE 10/31/2015 0700   KETONESUR NEGATIVE 10/31/2015 0700   PROTEINUR 100*  10/31/2015 0700   UROBILINOGEN 0.2 05/11/2013 1632   NITRITE NEGATIVE 10/31/2015 0700   LEUKOCYTESUR NEGATIVE 10/31/2015 0700   Sepsis Labs Invalid input(s): PROCALCITONIN,  WBC,  LACTICIDVEN Microbiology No results found for this or any previous visit (from the past 240 hour(s)).   Time coordinating discharge: Over 32 minutes  SIGNED:  Irwin Brakeman, MD  [facility] 11/05/2015, 12:02 PM Pager   If 7PM-7AM, please contact night-coverage [email or web address] Password [email or web address]

## 2015-11-06 LAB — GLUCOSE, CAPILLARY
Glucose-Capillary: 36 mg/dL — CL (ref 65–99)
Glucose-Capillary: 29 mg/dL — CL (ref 65–99)

## 2015-11-09 DIAGNOSIS — Z961 Presence of intraocular lens: Secondary | ICD-10-CM | POA: Diagnosis not present

## 2015-11-09 DIAGNOSIS — E103553 Type 1 diabetes mellitus with stable proliferative diabetic retinopathy, bilateral: Secondary | ICD-10-CM | POA: Diagnosis not present

## 2015-11-13 DIAGNOSIS — R0609 Other forms of dyspnea: Secondary | ICD-10-CM | POA: Diagnosis not present

## 2015-11-13 DIAGNOSIS — Z7901 Long term (current) use of anticoagulants: Secondary | ICD-10-CM | POA: Diagnosis not present

## 2015-11-16 ENCOUNTER — Other Ambulatory Visit: Payer: Self-pay | Admitting: Gastroenterology

## 2015-11-17 ENCOUNTER — Inpatient Hospital Stay: Payer: BLUE CROSS/BLUE SHIELD | Admitting: Pulmonary Disease

## 2015-12-28 ENCOUNTER — Encounter (HOSPITAL_COMMUNITY): Payer: Self-pay | Admitting: *Deleted

## 2015-12-28 NOTE — Progress Notes (Signed)
SPOKE WITH DR ROSE ANESTHESIA AND MADE AWARE CHEST CT 10-31-15 RESULTS, PATIENT DOES NOT NEED CHEST CT OR CHEST XRAY DAY OF PROCEDURE 01-02-16 PER DR ROSE.

## 2016-01-02 ENCOUNTER — Ambulatory Visit (HOSPITAL_COMMUNITY): Payer: BLUE CROSS/BLUE SHIELD | Admitting: Anesthesiology

## 2016-01-02 ENCOUNTER — Ambulatory Visit (HOSPITAL_COMMUNITY)
Admission: RE | Admit: 2016-01-02 | Discharge: 2016-01-02 | Disposition: A | Discharge status: Home / Self Care | Payer: BLUE CROSS/BLUE SHIELD | Source: Ambulatory Visit | Attending: Gastroenterology | Admitting: Gastroenterology

## 2016-01-02 ENCOUNTER — Encounter (HOSPITAL_COMMUNITY)
Admission: RE | Disposition: A | Discharge status: Home / Self Care | Payer: Self-pay | Source: Ambulatory Visit | Attending: Gastroenterology

## 2016-01-02 ENCOUNTER — Encounter (HOSPITAL_COMMUNITY): Payer: Self-pay

## 2016-01-02 DIAGNOSIS — Z1211 Encounter for screening for malignant neoplasm of colon: Secondary | ICD-10-CM | POA: Diagnosis not present

## 2016-01-02 DIAGNOSIS — Z794 Long term (current) use of insulin: Secondary | ICD-10-CM | POA: Insufficient documentation

## 2016-01-02 DIAGNOSIS — I341 Nonrheumatic mitral (valve) prolapse: Secondary | ICD-10-CM | POA: Insufficient documentation

## 2016-01-02 DIAGNOSIS — E7800 Pure hypercholesterolemia, unspecified: Secondary | ICD-10-CM | POA: Diagnosis not present

## 2016-01-02 DIAGNOSIS — E785 Hyperlipidemia, unspecified: Secondary | ICD-10-CM | POA: Diagnosis not present

## 2016-01-02 DIAGNOSIS — D123 Benign neoplasm of transverse colon: Secondary | ICD-10-CM | POA: Diagnosis not present

## 2016-01-02 DIAGNOSIS — R195 Other fecal abnormalities: Secondary | ICD-10-CM | POA: Insufficient documentation

## 2016-01-02 DIAGNOSIS — E1165 Type 2 diabetes mellitus with hyperglycemia: Secondary | ICD-10-CM | POA: Diagnosis not present

## 2016-01-02 DIAGNOSIS — Z7901 Long term (current) use of anticoagulants: Secondary | ICD-10-CM | POA: Diagnosis not present

## 2016-01-02 DIAGNOSIS — Z79899 Other long term (current) drug therapy: Secondary | ICD-10-CM | POA: Insufficient documentation

## 2016-01-02 DIAGNOSIS — E11319 Type 2 diabetes mellitus with unspecified diabetic retinopathy without macular edema: Secondary | ICD-10-CM | POA: Diagnosis not present

## 2016-01-02 DIAGNOSIS — K5730 Diverticulosis of large intestine without perforation or abscess without bleeding: Secondary | ICD-10-CM | POA: Insufficient documentation

## 2016-01-02 DIAGNOSIS — D125 Benign neoplasm of sigmoid colon: Secondary | ICD-10-CM | POA: Diagnosis not present

## 2016-01-02 DIAGNOSIS — Z86010 Personal history of colonic polyps: Secondary | ICD-10-CM | POA: Insufficient documentation

## 2016-01-02 DIAGNOSIS — I110 Hypertensive heart disease with heart failure: Secondary | ICD-10-CM | POA: Diagnosis not present

## 2016-01-02 DIAGNOSIS — I2510 Atherosclerotic heart disease of native coronary artery without angina pectoris: Secondary | ICD-10-CM | POA: Diagnosis not present

## 2016-01-02 DIAGNOSIS — K635 Polyp of colon: Secondary | ICD-10-CM | POA: Diagnosis not present

## 2016-01-02 DIAGNOSIS — D120 Benign neoplasm of cecum: Secondary | ICD-10-CM | POA: Insufficient documentation

## 2016-01-02 DIAGNOSIS — I509 Heart failure, unspecified: Secondary | ICD-10-CM | POA: Insufficient documentation

## 2016-01-02 DIAGNOSIS — D122 Benign neoplasm of ascending colon: Secondary | ICD-10-CM | POA: Diagnosis not present

## 2016-01-02 DIAGNOSIS — E669 Obesity, unspecified: Secondary | ICD-10-CM | POA: Insufficient documentation

## 2016-01-02 DIAGNOSIS — Z6830 Body mass index (BMI) 30.0-30.9, adult: Secondary | ICD-10-CM | POA: Diagnosis not present

## 2016-01-02 DIAGNOSIS — D124 Benign neoplasm of descending colon: Secondary | ICD-10-CM | POA: Diagnosis not present

## 2016-01-02 DIAGNOSIS — I482 Chronic atrial fibrillation: Secondary | ICD-10-CM | POA: Diagnosis not present

## 2016-01-02 DIAGNOSIS — K219 Gastro-esophageal reflux disease without esophagitis: Secondary | ICD-10-CM | POA: Diagnosis not present

## 2016-01-02 HISTORY — DX: Reserved for inherently not codable concepts without codable children: IMO0001

## 2016-01-02 HISTORY — PX: COLONOSCOPY WITH PROPOFOL: SHX5780

## 2016-01-02 LAB — POCT I-STAT 4, (NA,K, GLUC, HGB,HCT)
Glucose, Bld: 109 mg/dL — ABNORMAL HIGH (ref 65–99)
HCT: 38 % — ABNORMAL LOW (ref 39.0–52.0)
HEMOGLOBIN: 12.9 g/dL — AB (ref 13.0–17.0)
POTASSIUM: 6 mmol/L — AB (ref 3.5–5.1)
Sodium: 134 mmol/L — ABNORMAL LOW (ref 135–145)

## 2016-01-02 LAB — POCT I-STAT EG7
ACID-BASE EXCESS: 2 mmol/L (ref 0.0–2.0)
Bicarbonate: 28.5 mEq/L — ABNORMAL HIGH (ref 20.0–24.0)
Calcium, Ion: 1.2 mmol/L (ref 1.12–1.23)
HEMATOCRIT: 39 % (ref 39.0–52.0)
HEMOGLOBIN: 13.3 g/dL (ref 13.0–17.0)
O2 SAT: 55 %
PCO2 VEN: 48.8 mmHg (ref 45.0–50.0)
PH VEN: 7.375 — AB (ref 7.250–7.300)
POTASSIUM: 5 mmol/L (ref 3.5–5.1)
SODIUM: 138 mmol/L (ref 135–145)
TCO2: 30 mmol/L (ref 0–100)
pO2, Ven: 30 mmHg — ABNORMAL LOW (ref 31.0–45.0)

## 2016-01-02 SURGERY — COLONOSCOPY WITH PROPOFOL
Anesthesia: Monitor Anesthesia Care

## 2016-01-02 MED ORDER — PROPOFOL 500 MG/50ML IV EMUL
INTRAVENOUS | Status: DC | PRN
  Administered 2016-01-02 (×2): 30 mg via INTRAVENOUS

## 2016-01-02 MED ORDER — LACTATED RINGERS IV SOLN
INTRAVENOUS | Status: DC
  Administered 2016-01-02: 1000 mL via INTRAVENOUS

## 2016-01-02 MED ORDER — PROPOFOL 500 MG/50ML IV EMUL
INTRAVENOUS | Status: DC | PRN
  Administered 2016-01-02: 125 ug/kg/min via INTRAVENOUS

## 2016-01-02 MED ORDER — SODIUM CHLORIDE 0.9 % IV SOLN: INTRAVENOUS | Status: DC

## 2016-01-02 SURGICAL SUPPLY — 22 items

## 2016-01-02 NOTE — H&P (Signed)
  Procedure: Surveillance colonoscopy. Positive FIT test with normal hemoglobin. 03/13/2011 colonoscopy was performed with removal of a 6 mm sigmoid colon tubular adenomatous polyp and 8 mm transverse colon tubular adenomatous polyp. Chronic Coumadin anticoagulation.  History: The patient is an 82 year old male born [birth year]. He is scheduled to undergo a surveillance colonoscopy today. He stopped taking Coumadin 5 days ago. He has chronic atrial fibrillation.  Past medical history: Hypertension. Type 2 diabetes mellitus complicated by diabetic retinopathy. Restrictive lung disease. Her vocal disc disease. Mitral valve prolapse. Atrial fibrillation. Hypercholesterolemia. Gastroesophageal reflux. Benign prostatic hypertrophy. Tonsillectomy. Cataract surgery. Cervical disc surgery. Bilateral knee surgeries. Jerger thumb repair. Laser surgery on both eyes. Carpal tunnel release. Bilateral rotator cuff surgeries. Cholecystectomy.  Exam: The patient is alert and lying comfortably on the endoscopy stretcher. Abdomen is soft and nontender to palpation. Lungs are clear to auscultation. Cardiac exam reveals a regular rhythm.  Plan: Proceed with surveillance colonoscopy

## 2016-01-02 NOTE — Anesthesia Postprocedure Evaluation (Signed)
Anesthesia Post Note  Patient: Justin Leach  Procedure(s) Performed: Procedure(s) (LRB): COLONOSCOPY WITH PROPOFOL (N/A)  Patient location during evaluation: PACU Anesthesia Type: MAC Level of consciousness: awake and alert and oriented Pain management: pain level controlled Vital Signs Assessment: post-procedure vital signs reviewed and stable Respiratory status: spontaneous breathing, nonlabored ventilation and respiratory function stable Cardiovascular status: stable and blood pressure returned to baseline Postop Assessment: no signs of nausea or vomiting Anesthetic complications: no    Last Vitals:  Vitals:   01/02/16 1126 01/02/16 1128  BP: (!) 121/41   Pulse: (!) 52   Resp: (!) 23   Temp:  36.6 C    Last Pain:  Vitals:   01/02/16 1128  TempSrc: Oral                 Leimomi Zervas A.

## 2016-01-02 NOTE — Op Note (Signed)
[facility] Patient Name: Justin Leach Procedure Date: 01/02/2016 MRN: [id number] Attending MD: [name] , MD Date of Birth: Apr 06, 1932 CSN: [id number] Age: 82 Admit Type: Outpatient Procedure:                Colonoscopy Indications:              High risk colon cancer surveillance: Personal                            history of non-advanced adenoma Providers:                [name], MD, Dustin Flock RN, RN, Cherylynn Ridges, Technician, Herbie Drape, CRNA Referring MD:              Medicines:                Propofol per Anesthesia Complications:            No immediate complications. Estimated Blood Loss:     Estimated blood loss: none. Procedure:                Pre-Anesthesia Assessment:                           - Prior to the procedure, a History and Physical                            was performed, and patient medications and                            allergies were reviewed. The patient's tolerance of                            previous anesthesia was also reviewed. The risks                            and benefits of the procedure and the sedation                            options and risks were discussed with the patient.                            All questions were answered, and informed consent                            was obtained. Prior Anticoagulants: The patient has                            taken Coumadin (warfarin), last dose was 5 days                            prior to procedure. ASA Grade Assessment: III - A  patient with severe systemic disease. After                            reviewing the risks and benefits, the patient was                            deemed in satisfactory condition to undergo the                            procedure.                           After obtaining informed consent, the colonoscope                            was passed under direct vision.  Throughout the                            procedure, the patient's blood pressure, pulse, and                            oxygen saturations were monitored continuously. The                            [id number] scope was introduced through                            the anus and advanced to the the cecum, identified                            by appendiceal orifice and ileocecal valve. The                            colonoscopy was performed without difficulty. The                            patient tolerated the procedure well. The quality                            of the bowel preparation was good. The appendiceal                            orifice and the rectum were photographed. Findings:      The perianal and digital rectal examinations were normal.      A 7 mm polyp was found in the mid sigmoid colon. The polyp was sessile.       The polyp was removed with a hot snare. Resection and retrieval were       complete. Endoclip applied to site      Three sessile polyps were found in the descending colon. The polyps were       5 mm in size. These polyps were removed with a cold snare. Resection and       retrieval were complete.      Two sessile polyps were found in the transverse colon. The polyps  were 5       mm in size. These polyps were removed with a cold snare. Resection and       retrieval were complete.      A 12 mm polyp was found in the proximal ascending colon. The polyp was       pedunculated. The polyp was removed with a hot snare. Resection and       retrieval were complete. Endoclip applied to site.      A 5 mm polyp was found in the cecum. The polyp was sessile. The polyp       was removed with a cold snare. Resection and retrieval were complete.      Multiple small and large-mouthed diverticula were found in the sigmoid       colon.      The exam was otherwise without abnormality. Impression:               - One 7 mm polyp in the mid sigmoid colon, removed                             with a hot snare. Resected and retrieved.                           - Three 5 mm polyps in the descending colon,                            removed with a cold snare. Resected and retrieved.                           - Two 5 mm polyps in the transverse colon, removed                            with a cold snare. Resected and retrieved.                           - One 12 mm polyp in the proximal ascending colon,                            removed with a hot snare. Resected and retrieved.                           - One 5 mm polyp in the cecum, removed with a cold                            snare. Resected and retrieved.                           - Diverticulosis in the sigmoid colon.                           - The examination was otherwise normal. Moderate Sedation:      N/A- Per Anesthesia Care Recommendation:           - Patient has a contact number available for  emergencies. The signs and symptoms of potential                            delayed complications were discussed with the                            patient. Return to normal activities tomorrow.                            Written discharge instructions were provided to the                            patient.                           - Repeat colonoscopy is not recommended for                            surveillance.                           - Resume previous diet.                           - Continue present medications. Procedure Code(s):        --- Professional ---                           [phone], Colonoscopy, flexible; with removal of                            tumor(s), polyp(s), or other lesion(s) by snare                            technique Diagnosis Code(s):        --- Professional ---                           Z86.010, Personal history of colonic polyps                           D12.5, Benign neoplasm of sigmoid colon                           D12.2, Benign  neoplasm of ascending colon                           D12.0, Benign neoplasm of cecum                           D12.4, Benign neoplasm of descending colon                           D12.3, Benign neoplasm of transverse colon (hepatic                            flexure or splenic flexure)  K57.30, Diverticulosis of large intestine without                            perforation or abscess without bleeding CPT copyright 2016 American [employer] Association. All rights reserved. The codes documented in this report are preliminary and upon coder review may  be revised to meet current compliance requirements. Earle Gell, MD [name], MD 01/02/2016 11:27:42 AM This report has been signed electronically. Number of Addenda: 0

## 2016-01-02 NOTE — Anesthesia Preprocedure Evaluation (Addendum)
Anesthesia Evaluation  Patient identified by MRN, date of birth, ID band Patient awake    Reviewed: Allergy & Precautions, NPO status , Patient's Chart, lab work & pertinent test results, reviewed documented beta blocker date and time   Airway Mallampati: II  TM Distance: >3 FB Neck ROM: Full    Dental no notable dental hx.    Pulmonary shortness of breath and with exertion, pneumonia, resolved,  Hx/o Pulmonary fibrosis   Pulmonary exam normal breath sounds clear to auscultation       Cardiovascular hypertension, Pt. on medications and Pt. on home beta blockers + CAD, +CHF and + DOE  Normal cardiovascular exam+ dysrhythmias Atrial Fibrillation  Rhythm:Regular Rate:Normal  Echo 10/31/15 - Left ventricle: The cavity size was normal. Wall thickness was   increased in a pattern of mild LVH. Systolic function was normal.   The estimated ejection fraction was in the range of 60% to 65%.   Wall motion was normal; there were no regional wall motion   abnormalities. - Aortic valve: Trileaflet; mildly thickened, mildly calcified   leaflets. - Mitral valve: There was mild regurgitation. - Left atrium: The atrium was mildly dilated.  EKG 10/31/2015- NSR 1st degree AV Block   Neuro/Psych negative neurological ROS  negative psych ROS   GI/Hepatic Neg liver ROS, GERD  Medicated and Controlled,Screening colonoscopy   Endo/Other  diabetes, Poorly Controlled, Type 2, Insulin DependentObesity Hyperlipidemia  Renal/GU Renal hypertensionRenal diseaseCKD- stage III  negative genitourinary   Musculoskeletal negative musculoskeletal ROS (+)   Abdominal (+) + obese,   Peds  Hematology  (+) anemia ,   Anesthesia Other Findings   Reproductive/Obstetrics                            Anesthesia Physical Anesthesia Plan  ASA: III  Anesthesia Plan: MAC   Post-op Pain Management:    Induction:  Intravenous  Airway Management Planned: Natural Airway, Nasal Cannula and Simple Face Mask  Additional Equipment:   Intra-op Plan:   Post-operative Plan:   Informed Consent: I have reviewed the patients History and Physical, chart, labs and discussed the procedure including the risks, benefits and alternatives for the proposed anesthesia with the patient or authorized representative who has indicated his/her understanding and acceptance.   Dental advisory given  Plan Discussed with: Anesthesiologist, CRNA and Surgeon  Anesthesia Plan Comments:         Anesthesia Quick Evaluation

## 2016-01-02 NOTE — Transfer of Care (Signed)
Immediate Anesthesia Transfer of Care Note  Patient: Justin Leach  Procedure(s) Performed: Procedure(s): COLONOSCOPY WITH PROPOFOL (N/A)  Patient Location: PACU  Anesthesia Type:MAC  Level of Consciousness: awake, alert  and oriented  Airway & Oxygen Therapy: Patient Spontanous Breathing and Patient connected to face mask oxygen  Post-op Assessment: Report given to RN and Post -op Vital signs reviewed and stable  Post vital signs: Reviewed and stable  Last Vitals:  Vitals:   01/02/16 0934  BP: (!) 154/35  Pulse: (!) 57  Resp: 18  Temp: 36.4 C    Last Pain:  Vitals:   01/02/16 0934  TempSrc: Oral         Complications: No apparent anesthesia complications

## 2016-01-02 NOTE — Addendum Note (Signed)
Addendum  created 01/02/16 1307 by Josephine Igo, MD   Sign clinical note

## 2016-01-04 ENCOUNTER — Encounter (HOSPITAL_COMMUNITY): Payer: Self-pay | Admitting: Gastroenterology

## 2016-01-15 ENCOUNTER — Encounter: Payer: Self-pay | Admitting: Pulmonary Disease

## 2016-01-15 ENCOUNTER — Ambulatory Visit (INDEPENDENT_AMBULATORY_CARE_PROVIDER_SITE_OTHER): Payer: BLUE CROSS/BLUE SHIELD | Admitting: Pulmonary Disease

## 2016-01-15 VITALS — BP 170/74 | HR 52 | Wt 203.0 lb

## 2016-01-15 DIAGNOSIS — R0600 Dyspnea, unspecified: Secondary | ICD-10-CM

## 2016-01-15 NOTE — Patient Instructions (Signed)
We will schedule you for a bronchoscope for airway inspection. Our office will be in touch with you regarding the dates and instructions.  Return to clinic in 3 months.

## 2016-01-15 NOTE — Progress Notes (Signed)
Justin Leach    [id number]    Feb 27, 1932  Primary Care Physician:GRIFFIN,JOHN Broadus John, MD  Referring Physician: Lavone Orn, MD [place] 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   HPI: Justin Leach is a 82 year old with past medical history as below. He was admitted in October 2016 with new onset atrial fibrillation, diastolic heart failure, acute on chronic renal failure. He was initially on IV Cardizem, metoprolol which was then changed to amiodarone and Coumadin anticoagulation. He subsequently converted to normal sinus rhythm and had remained in sinus rhythm since then. He has a CTA done which did not show pulmonary embolism but basal fibrosis, atelectasis. PFTs which showed restriction and reduction in diffusion capacity. Repeat CT scan in Jan 17 shows findings concerning for amiodarone toxicity vs NSIP.   His chief complaint is dyspnea on exertion. This has been going on for the past 4-5 years, worsened over the past 1 year. He denies any cough, sputum, fevers, chills.   Outpatient Encounter Prescriptions as of 01/15/2016  Medication Sig  . atorvastatin (LIPITOR) 80 MG tablet Take 40 mg by mouth every evening.   . Cholecalciferol (VITAMIN D-3) 1000 UNITS CAPS Take 1 capsule by mouth daily.  . insulin aspart (NOVOLOG) 100 UNIT/ML injection Inject 0-5 Units into the skin 3 (three) times daily before meals. Sliding scale.  . insulin detemir (LEVEMIR) 100 UNIT/ML injection Inject 20-40 Units into the skin at bedtime. 40 units in the morning and 20 units in the evening. ( depending on how pt feels at the time of dose)  . lisinopril (PRINIVIL,ZESTRIL) 10 MG tablet Take 1 tablet (10 mg total) by mouth daily.  . metoprolol (LOPRESSOR) 50 MG tablet Take 50 mg by mouth 2 (two) times daily.  Marland Kitchen omeprazole (PRILOSEC) 20 MG  capsule Take 20 mg by mouth daily.   . vitamin B-12 (CYANOCOBALAMIN) 1000 MCG tablet Take 1,000 mcg by mouth daily.  Marland Kitchen warfarin (COUMADIN) 3 MG tablet Take 3 mg by mouth daily.   No facility-administered encounter medications on file as of 01/15/2016.     Allergies as of 01/15/2016 - Review Complete 01/15/2016  Allergen Reaction Noted  . Amiodarone Other (See Comments) 10/31/2015  . Nifedipine Other (See Comments) 05/11/2013    Past Medical History:  Diagnosis Date  . Bradycardia    a. during 02/2015 admission - occasional HR in 40s while being treated for atrial fib.  . Cervical disc disease   . Chronic diastolic CHF (congestive heart failure) ([facility])    a. Dx 02/2015 -  2D echo 03/02/15 showed severe focal basal hypertrophy of the septum, EF 55-60%, mild MR, no effusion.  . CKD (chronic kidney disease), stage III   . Coronary artery calcification seen on CT scan    a. Nuc 02/2015 was normal.  . Dyslipidemia   . GERD (gastroesophageal reflux disease)   . Hypertension   . 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 PAF (paroxysmal atrial fibrillation) ([facility])    a. Dx 02/2015 - in and out on tele, rx'd Coumadin and amiodarone.  . Pneumonia 2007  . Shortness of breath dyspnea    WITH EXERTION  . Type II diabetes mellitus ([facility])     Past Surgical History:  Procedure Laterality Date  . CATARACT EXTRACTION  W/ INTRAOCULAR LENS  IMPLANT, BILATERAL Bilateral   . CERVICAL DISC SURGERY     "i've had 3 neck ORs; not sure what kind; all thru the back of my neck"  . CHOLECYSTECTOMY    . COLONOSCOPY WITH PROPOFOL N/A 01/02/2016   Procedure: COLONOSCOPY WITH PROPOFOL;  Surgeon: [name], MD;  Location: [facility];  Service: Endoscopy;  Laterality: N/A;  . EYE SURGERY  1930's   "don't know what for" (05/11/2013)  . JOINT REPLACEMENT     bilateral knees, elbows, and shoulders (on 05/11/2013 pt denies all joint replacements"   . KNEE  ARTHROPLASTY     "had one scope; one open knee OR; not sure which on which side" (05/10/2013)  . KNEE ARTHROSCOPY     "had one scope; one open knee OR; not sure which on which side" (05/10/2013)  . POSTERIOR FUSION CERVICAL SPINE    . ROTATOR CUFF REPAIR Bilateral     Family History  Problem Relation Age of Onset  . Diabetes Mellitus II Paternal Grandmother   . Heart disease Neg Hx     He does not know his father's history.   . Cancer Neg Hx   . Diabetes Neg Hx   . CAD Neg Hx   . Lung disease Neg Hx   . Rheumatologic disease Neg Hx     Social History   Social History  . Marital status: Married    Spouse name: Vaughan Basta  . Number of children: 0  . Years of education: N/A   Occupational History  . Retired from Architect work.    Social History Main Topics  . Smoking status: Never Smoker  . Smokeless tobacco: Former Systems developer    Types: Chew  . Alcohol use No  . Drug use: No     Comment: used chew when I was a teenager per patient   . Sexual activity: Not on file   Other Topics Concern  . Not on file   Social History Narrative   Married.  Lives with wife.  Ambulates independently.  Has a dog names is named Programmer, applications  .   Retired - worked for [employer] paper   No Sport and exercise psychologist Pulmonary:   No known bird, mold, or hot tub exposure.     Review of systems: Review of Systems  Constitutional: Negative for fever and chills.  HENT: Negative.   Eyes: Negative for blurred vision.  Respiratory: as per HPI  Cardiovascular: Negative for chest pain and palpitations.  Gastrointestinal: Negative for vomiting, diarrhea, blood per rectum. Genitourinary: Negative for dysuria, urgency, frequency and hematuria.  Musculoskeletal: Negative for myalgias, back pain and joint pain.  Skin: Negative for itching and rash.  Neurological: Negative for dizziness, tremors, focal weakness, seizures and loss of consciousness.  Endo/Heme/Allergies: Negative for environmental  allergies.  Psychiatric/Behavioral: Negative for depression, suicidal ideas and hallucinations.  All other systems reviewed and are negative.   Physical Exam: Blood pressure (!) 170/74, pulse (!) 52, weight 203 lb (92.1 kg), SpO2 95 %. Gen:      No acute distress HEENT:  EOMI, sclera anicteric Neck:     No masses; no thyromegaly Lungs:    Clear to auscultation bilaterally; normal respiratory effort CV:         Regular rate and rhythm; no murmurs Abd:      + bowel sounds; soft, non-tender; no palpable masses, no distension Ext:    No edema; adequate peripheral perfusion Skin:  Warm and dry; no rash Neuro: alert and oriented x 3 Psych: normal mood and affect  Data Reviewed: PFTs 04/26/15 FVC 1.59 [52%) FEV1 1.25 (59%) F/F 79 TLC 2.69 [45%) DLCO 53% Severe restriction, moderate reduction in diffusion capacity. Diffusion capacity corrects for alveolar volume.  CTA 02/28/15 No PE, scarring, atelectasis at lung bases.  CT chest 06/12/15 Images reviewed. Subpleural reticulation, groundglass, 7 mm right lower lobe nodule.  CT high res 10/31/15 1. The appearance of the lungs appears very similar to the prior examination and is favored to reflect mild nonspecific interstitial pneumonia (NSIP). 2. 6 mm endobronchial nodule associated with the lateral margin of the proximal bronchus intermedius, similar to the prior study. This could represent an endobronchial polyp or other neoplasm. Correlation with bronchoscopy for direct inspection and potential biopsy is suggested if clinically appropriate. 3. 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  Mathe was been on amiodarone after he was admitted for heart failure, onset A. Fib in Oct 2016. His  PFT show restriction and reduction in diffusion capacity. However the diffusion capacity corrects for alveolar volume. CT scan does show lower lobe changes that may represent amiodarone toxicity vs NSIP but the extent of the changes appear very mild and not much different from CT images before he was started on amiodarone.   He was hospitalized in June of this year with respiratory failure. He was treated with antibiotics and prednisone with improvement in symptoms. He is currently off prednisone. Repeat labs show positive CCP but does not have any joint symptoms, no synovitis suggestive of overt rheumatoid arthritis. If there is progression then he will need a more prolonged course of immunosuppression.  #2 Possible endobronchial lesion Schedule for a bronchoscopy with airway inspection.  Plan/Recommendations: - Continue monitoring of CT scan, PFTs and lung symptoms to determine if he needs to go back on immunosuppression - Bronchoscope with airway inspection.  Marshell [name] MD [facility] Pager [phone] 01/15/2016, 4:23 PM  CC: Lavone Orn, MD

## 2016-01-18 ENCOUNTER — Telehealth: Payer: Self-pay | Admitting: Pulmonary Disease

## 2016-01-18 NOTE — Telephone Encounter (Signed)
Will forward to [place] per her request  Bronch already set up  She will call and provide details  Thanks

## 2016-01-18 NOTE — Telephone Encounter (Signed)
Bronch has been scheduled for 9.14.17 @ 0900 Pt is aware of the appt date/time  He is also aware [facility] will call him prior to the appt with all of the specifics Nothing further needed; will sign off

## 2016-01-31 ENCOUNTER — Telehealth: Payer: Self-pay | Admitting: Pulmonary Disease

## 2016-01-31 NOTE — Telephone Encounter (Signed)
Called [facility] to see if they are going to contact patient with instructions. Spoke with Tyra at [facility] [employer].  She said that they call patient's the day before procedure before 4pm.  Called and spoke with patient and advised him that hospital will be calling him before 4pm today.  Patient verbalized understanding. Nothing further needed.

## 2016-02-01 ENCOUNTER — Ambulatory Visit (HOSPITAL_COMMUNITY)
Admission: RE | Admit: 2016-02-01 | Discharge: 2016-02-01 | Disposition: A | Discharge status: Home / Self Care | Payer: BLUE CROSS/BLUE SHIELD | Source: Ambulatory Visit | Attending: Pulmonary Disease | Admitting: Pulmonary Disease

## 2016-02-01 ENCOUNTER — Encounter (HOSPITAL_COMMUNITY)
Admission: RE | Disposition: A | Discharge status: Home / Self Care | Payer: Self-pay | Source: Ambulatory Visit | Attending: Pulmonary Disease

## 2016-02-01 ENCOUNTER — Encounter (HOSPITAL_COMMUNITY): Payer: Self-pay | Admitting: Respiratory Therapy

## 2016-02-01 DIAGNOSIS — I2510 Atherosclerotic heart disease of native coronary artery without angina pectoris: Secondary | ICD-10-CM | POA: Insufficient documentation

## 2016-02-01 DIAGNOSIS — Z7901 Long term (current) use of anticoagulants: Secondary | ICD-10-CM | POA: Insufficient documentation

## 2016-02-01 DIAGNOSIS — N183 Chronic kidney disease, stage 3 (moderate): Secondary | ICD-10-CM | POA: Diagnosis not present

## 2016-02-01 DIAGNOSIS — E785 Hyperlipidemia, unspecified: Secondary | ICD-10-CM | POA: Insufficient documentation

## 2016-02-01 DIAGNOSIS — Z96612 Presence of left artificial shoulder joint: Secondary | ICD-10-CM | POA: Insufficient documentation

## 2016-02-01 DIAGNOSIS — I130 Hypertensive heart and chronic kidney disease with heart failure and stage 1 through stage 4 chronic kidney disease, or unspecified chronic kidney disease: Secondary | ICD-10-CM | POA: Diagnosis not present

## 2016-02-01 DIAGNOSIS — Z96622 Presence of left artificial elbow joint: Secondary | ICD-10-CM | POA: Diagnosis not present

## 2016-02-01 DIAGNOSIS — Z794 Long term (current) use of insulin: Secondary | ICD-10-CM | POA: Insufficient documentation

## 2016-02-01 DIAGNOSIS — I5032 Chronic diastolic (congestive) heart failure: Secondary | ICD-10-CM | POA: Insufficient documentation

## 2016-02-01 DIAGNOSIS — I480 Paroxysmal atrial fibrillation: Secondary | ICD-10-CM | POA: Diagnosis not present

## 2016-02-01 DIAGNOSIS — R911 Solitary pulmonary nodule: Secondary | ICD-10-CM | POA: Diagnosis present

## 2016-02-01 DIAGNOSIS — Z96621 Presence of right artificial elbow joint: Secondary | ICD-10-CM | POA: Diagnosis not present

## 2016-02-01 DIAGNOSIS — Z96653 Presence of artificial knee joint, bilateral: Secondary | ICD-10-CM | POA: Insufficient documentation

## 2016-02-01 DIAGNOSIS — Z79899 Other long term (current) drug therapy: Secondary | ICD-10-CM | POA: Insufficient documentation

## 2016-02-01 DIAGNOSIS — Z87891 Personal history of nicotine dependence: Secondary | ICD-10-CM | POA: Insufficient documentation

## 2016-02-01 DIAGNOSIS — K219 Gastro-esophageal reflux disease without esophagitis: Secondary | ICD-10-CM | POA: Insufficient documentation

## 2016-02-01 DIAGNOSIS — E1122 Type 2 diabetes mellitus with diabetic chronic kidney disease: Secondary | ICD-10-CM | POA: Insufficient documentation

## 2016-02-01 DIAGNOSIS — J398 Other specified diseases of upper respiratory tract: Secondary | ICD-10-CM | POA: Diagnosis not present

## 2016-02-01 DIAGNOSIS — Z96611 Presence of right artificial shoulder joint: Secondary | ICD-10-CM | POA: Insufficient documentation

## 2016-02-01 HISTORY — PX: VIDEO BRONCHOSCOPY: SHX5072

## 2016-02-01 LAB — BODY FLUID CELL COUNT WITH DIFFERENTIAL
Lymphs, Fluid: 14 %
Monocyte-Macrophage-Serous Fluid: 79 % (ref 50–90)
Neutrophil Count, Fluid: 7 % (ref 0–25)
Total Nucleated Cell Count, Fluid: 99 cu mm (ref 0–1000)

## 2016-02-01 LAB — GLUCOSE, CAPILLARY: Glucose-Capillary: 89 mg/dL (ref 65–99)

## 2016-02-01 SURGERY — VIDEO BRONCHOSCOPY WITHOUT FLUORO
Anesthesia: Moderate Sedation | Laterality: Bilateral

## 2016-02-01 MED ORDER — PHENYLEPHRINE HCL 0.25 % NA SOLN
NASAL | Status: DC | PRN
  Administered 2016-02-01: 2 via NASAL

## 2016-02-01 MED ORDER — LIDOCAINE HCL 2 % EX GEL
CUTANEOUS | Status: DC | PRN
  Administered 2016-02-01: 1

## 2016-02-01 MED ORDER — FENTANYL CITRATE (PF) 100 MCG/2ML IJ SOLN
INTRAMUSCULAR | Status: AC
  Filled 2016-02-01: qty 4

## 2016-02-01 MED ORDER — FENTANYL CITRATE (PF) 100 MCG/2ML IJ SOLN
INTRAMUSCULAR | Status: DC | PRN
  Administered 2016-02-01 (×2): 25 ug via INTRAVENOUS

## 2016-02-01 MED ORDER — SODIUM CHLORIDE 0.9 % IV SOLN
Freq: Once | INTRAVENOUS | Status: AC
  Administered 2016-02-01: 09:00:00 via INTRAVENOUS

## 2016-02-01 MED ORDER — MIDAZOLAM HCL 10 MG/2ML IJ SOLN
INTRAMUSCULAR | Status: DC | PRN
  Administered 2016-02-01 (×2): 1 mg via INTRAVENOUS

## 2016-02-01 MED ORDER — LIDOCAINE HCL 1 % IJ SOLN
INTRAMUSCULAR | Status: DC | PRN
  Administered 2016-02-01: 6 mL via RESPIRATORY_TRACT

## 2016-02-01 MED ORDER — MIDAZOLAM HCL 5 MG/ML IJ SOLN
INTRAMUSCULAR | Status: AC
  Filled 2016-02-01: qty 2

## 2016-02-01 NOTE — H&P (Signed)
Mr. [name] is here for FOB for airway examination. See clinic note from 8/28 for details.  He does not have any complaints.  Blood pressure (!) 140/53, pulse (!) 54, temperature 97.7 F (36.5 C), temperature source Oral, resp. rate 18, height 5\' 5"  (1.651 m), weight 198 lb (89.8 kg), SpO2 93 %. Awake, oriented, no distress CVS-RRR RS- Clear, no wheeze, crackles. Abd- Soft, + BS Ext- No edema.  Assessment: Eval for endobronchial lesion  Stable to proceed with bronch, BAL, brushings.  Marshell [name] MD [facility] Pager [phone] If no answer or after 3pm call: [phone] 02/01/2016, 12:46 PM

## 2016-02-01 NOTE — Op Note (Signed)
[facility] Cardiopulmonary Patient Name: Justin Leach Procedure Date: 02/01/2016 MRN: [id number] Attending MD: Marshell [name] , MD Date of Birth: 01/11/1932 CSN: [id number] Age: 82 Admit Type: Outpatient Ethnicity: Not Hispanic or Latino Procedure:            Bronchoscopy Indications:          Endobronchial nodule Providers:            Marshell [name], MD, Ashley Mariner RRT,RCP, Phillis Knack                        RRT, RCP Referring MD:          Medicines:            Fentanyl 50 mcg IV, Midazolam 2 mg IV Complications:        No immediate complications Estimated Blood Loss: Estimated blood loss: none. Procedure:      Pre-Anesthesia Assessment:      - Prior to the procedure, a History and Physical was performed, and       patient medications and allergies were reviewed. The patient's tolerance       of previous anesthesia was also reviewed. The risks and benefits of the       procedure and the sedation options and risks were discussed with the       patient. All questions were answered, and informed consent was obtained.       Anticoagulants: The patient has taken aspirin. It was decided not to       withhold this medication prior to procedure. ASA Grade Assessment: II -       A patient with mild systemic disease. After reviewing the risks and       benefits, the patient was deemed in satisfactory condition to undergo       the procedure.      After obtaining informed consent, the bronchoscope was passed under       direct vision. Throughout the procedure, the patient's blood pressure,       pulse, and oxygen saturations were monitored continuously. the [id number]       [id number]) scope was introduced through the left nostril and advanced to       the tracheobronchial tree. The procedure was accomplished with ease. The       patient tolerated the procedure well. The patient tolerated the       procedure well. The total duration of the procedure was 15  minutes. Findings:      Bilateral Lung Abnormalities: Nodular mucosa was found in the trachea,       right bronchus intermedius and left main stem. Endobronchial brushings       performed on the biggest nodule in right brionchus intermedisu (5). BAL       was performed in right middle lobe of the lung and sent for. 150 mL of       fluid were instilled. 100 mL were returned. The return was cloudy. There       were no mucoid plugs in the return fluid. Multiple specimens were       obtained and pooled into one specimen, which was sent for analysis. Impression:      - Nodular mucosa was visualized in the trachea.      - Bronchoalveolar lavage was performed. Moderate Sedation:      Moderate (conscious) sedation was administered by the endoscopy nurse       and supervised  by the endoscopist. The following parameters were       monitored: oxygen saturation, heart rate, blood pressure, respiratory       rate, EKG, adequacy of pulmonary ventilation, and response to care.       Total physician intraservice time was 15 minutes. Recommendation:      - Await test, BAL and brushing results. Procedure Code(s):      --- Professional ---      [phone], Bronchoscopy, rigid or flexible, including fluoroscopic guidance,       when performed; diagnostic, with cell washing, when performed (separate       procedure)      99152, Moderate sedation services provided by the same physician or       other qualified health care professional performing the diagnostic or       therapeutic service that the sedation supports, requiring the presence       of an independent trained observer to assist in the monitoring of the       patient's level of consciousness and physiological status; initial 15       minutes of intraservice time, patient age 68 years or older Diagnosis Code(s):      --- Professional ---      J39.8, Other specified diseases of upper respiratory tract CPT copyright 2016 American Medical Association.  All rights reserved. The codes documented in this report are preliminary and upon coder review may  be revised to meet current compliance requirements. Marshell [name], MD 02/01/2016 9:52:44 AM Number of Addenda: 0 Scope In: 9:21:55 AM Scope Out: 9:35:25 AM

## 2016-02-01 NOTE — Progress Notes (Signed)
Video bronchoscopy performed.  Intervention bronchial washing. Intervention bronchial brushing.  No complications noted.  Will continue to monitor. 

## 2016-02-01 NOTE — Discharge Instructions (Signed)
Flexible Bronchoscopy, Care After These instructions give you information on caring for yourself after your procedure. Your doctor may also give you more specific instructions. Call your doctor if you have any problems or questions after your procedure. HOME CARE  Do not eat or drink anything for 2 hours after your procedure. If you try to eat or drink before the medicine wears off, food or drink could go into your lungs. You could also burn yourself.  After 2 hours have passed and when you can cough and gag normally, you may eat soft food and drink liquids slowly.  The day after the test, you may eat your normal diet.  You may do your normal activities.  Keep all doctor visits. GET HELP RIGHT AWAY IF:  You get more and more short of breath.  You get light-headed.  You feel like you are going to pass out (faint).  You have chest pain.  You have new problems that worry you.  You cough up more than a little blood.  You cough up more blood than before. MAKE SURE YOU:  Understand these instructions.  Will watch your condition.  Will get help right away if you are not doing well or get worse.   Do not eat or drink anything until 11:15 am on 02/01/2016.   This information is not intended to replace advice given to you by your health care provider. Make sure you discuss any questions you have with your health care provider.   Document Released: 03/03/2009 Document Revised: 05/11/2013 Document Reviewed: 01/08/2013 Elsevier Interactive Patient Education [employer].

## 2016-02-03 LAB — CULTURE, BAL-QUANTITATIVE: CULTURE: NORMAL — AB

## 2016-02-03 LAB — CULTURE, BAL-QUANTITATIVE W GRAM STAIN: Special Requests: NORMAL

## 2016-02-07 ENCOUNTER — Telehealth: Payer: Self-pay | Admitting: Pulmonary Disease

## 2016-02-07 LAB — ACID FAST SMEAR (AFB, MYCOBACTERIA): Acid Fast Smear: NEGATIVE

## 2016-02-07 LAB — ACID FAST SMEAR (AFB)

## 2016-02-07 NOTE — Telephone Encounter (Signed)
Pt retuening call.Justin Leach ° °

## 2016-02-07 NOTE — Telephone Encounter (Signed)
Called and spoke with pt and he is aware of results per PM.  Pt voiced his understanding and nothing further is needed.

## 2016-02-07 NOTE — Telephone Encounter (Signed)
Pt calling requesting results of Bronchoscopy. Please advise Dr Vaughan Browner. Thanks.

## 2016-02-07 NOTE — Telephone Encounter (Signed)
lmomtcb x1 for pt 

## 2016-02-07 NOTE — Telephone Encounter (Signed)
Please let him know that the pathology shows benign changes, there is no evidence on malignancy. We will continue to monitor with follow up CT scan. I will discuss further him at time of next visit.

## 2016-02-23 DIAGNOSIS — Z6834 Body mass index (BMI) 34.0-34.9, adult: Secondary | ICD-10-CM | POA: Diagnosis not present

## 2016-02-23 DIAGNOSIS — Z7901 Long term (current) use of anticoagulants: Secondary | ICD-10-CM | POA: Diagnosis not present

## 2016-02-23 DIAGNOSIS — J849 Interstitial pulmonary disease, unspecified: Secondary | ICD-10-CM | POA: Diagnosis not present

## 2016-02-23 DIAGNOSIS — E113599 Type 2 diabetes mellitus with proliferative diabetic retinopathy without macular edema, unspecified eye: Secondary | ICD-10-CM | POA: Diagnosis not present

## 2016-02-23 DIAGNOSIS — E782 Mixed hyperlipidemia: Secondary | ICD-10-CM | POA: Diagnosis not present

## 2016-02-23 DIAGNOSIS — N183 Chronic kidney disease, stage 3 (moderate): Secondary | ICD-10-CM | POA: Diagnosis not present

## 2016-02-23 DIAGNOSIS — Z23 Encounter for immunization: Secondary | ICD-10-CM | POA: Diagnosis not present

## 2016-02-23 DIAGNOSIS — E669 Obesity, unspecified: Secondary | ICD-10-CM | POA: Diagnosis not present

## 2016-02-23 DIAGNOSIS — I129 Hypertensive chronic kidney disease with stage 1 through stage 4 chronic kidney disease, or unspecified chronic kidney disease: Secondary | ICD-10-CM | POA: Diagnosis not present

## 2016-02-23 DIAGNOSIS — Z0000 Encounter for general adult medical examination without abnormal findings: Secondary | ICD-10-CM | POA: Diagnosis not present

## 2016-02-23 DIAGNOSIS — E1122 Type 2 diabetes mellitus with diabetic chronic kidney disease: Secondary | ICD-10-CM | POA: Diagnosis not present

## 2016-03-01 LAB — FUNGUS CULTURE WITH STAIN

## 2016-03-01 LAB — FUNGAL ORGANISM REFLEX

## 2016-03-01 LAB — FUNGUS CULTURE RESULT

## 2016-03-21 LAB — ACID FAST CULTURE WITH REFLEXED SENSITIVITIES: ACID FAST CULTURE - AFSCU3: NEGATIVE

## 2016-03-28 ENCOUNTER — Telehealth: Payer: Self-pay | Admitting: *Deleted

## 2016-03-28 NOTE — Telephone Encounter (Signed)
Dr Vaughan Browner, We ordered HRCT at the 2.27.17 ov for follow up ILD Pt has since had a HRCT 6.13.17 by Dr Christy Gentles, impression is below:  IMPRESSION: 1. The appearance of the lungs appears very similar to the prior examination and is favored to reflect mild nonspecific interstitial pneumonia (NSIP). 2. 6 mm endobronchial nodule associated with the lateral margin of the proximal bronchus intermedius, similar to the prior study. This could represent an endobronchial polyp or other neoplasm. Correlation with bronchoscopy for direct inspection and potential biopsy is suggested if clinically appropriate. 3. Stable subpleural nodule with mean diameter of 6 mm in the periphery of the right lower lobe, favored to be a benign subpleural lymph node. Repeat CT at 18-24 months (from scan dated 06/12/2015) is considered optional for low-risk patients, but is recommended for high-risk patients. This recommendation follows the consensus statement: Guidelines for Management of Incidental Pulmonary Nodules Detected on CT Images:From the Fleischner Society 2017; published online before print (10.1148/radiol.IJ:2314499). 4. Atherosclerosis, including left main and 2 vessel coronary artery disease. 5. Small hiatal hernia. 6. Mild cardiomegaly. 7. Additional incidental findings, as above.  Would you like the patient to still have the HRCT December 2017?  Per the 9.20.17 phone note: Marshell [name], MD 02/07/16 1:15 PM  Note   Please let him know that the pathology shows benign changes, there is no evidence on malignancy. We will continue to monitor with follow up CT scan. I will discuss further him at time of next visit.

## 2016-03-28 NOTE — Telephone Encounter (Signed)
-----   Message from Ilona Sorrel sent at 03/22/2016 12:13 PM EDT ----- Regarding: CT You put in an order on 07/14/15 for pt to have CT High Res in December.  Pt had High Res CT on 10/31/15 ordered by Dr Christy Gentles.  Do I still need to schedule this for December?   Justin Leach

## 2016-03-28 NOTE — Telephone Encounter (Signed)
You can cancel it. I will review at follow up visit.

## 2016-03-29 NOTE — Telephone Encounter (Signed)
Noted, PCCs are aware to cancel HRCT

## 2016-04-16 ENCOUNTER — Ambulatory Visit (INDEPENDENT_AMBULATORY_CARE_PROVIDER_SITE_OTHER): Payer: BLUE CROSS/BLUE SHIELD | Admitting: Pulmonary Disease

## 2016-04-16 ENCOUNTER — Encounter: Payer: Self-pay | Admitting: Pulmonary Disease

## 2016-04-16 VITALS — BP 114/72 | HR 56 | Ht 65.5 in | Wt 201.6 lb

## 2016-04-16 DIAGNOSIS — R0600 Dyspnea, unspecified: Secondary | ICD-10-CM

## 2016-04-16 NOTE — Progress Notes (Signed)
Justin Leach    [id number]    09/04/1931  Primary Care Physician:Justin Broadus John, MD  Referring Physician: Lavone Orn, MD [place] [employer] [address] [place],  [address]  Chief complaint:  Follow up for IPAF, interstitial pneumonia with autoimmune features. Positive CCP Severe restrictive lung disease Dyspnea on exertion 6 mm endobronchial nodule, 7 mm pulmonary nodule  HPI: [name] is a 82 year old with past medical history as below. He was admitted in October 2016 with new onset atrial fibrillation, diastolic heart failure, acute on chronic renal failure. He was initially on IV Cardizem, metoprolol which was then changed to amiodarone and Coumadin anticoagulation. He subsequently converted to normal sinus rhythm and had remained in sinus rhythm since then. He has a CTA done which did not show pulmonary embolism but basal fibrosis, atelectasis. PFTs which showed restriction and reduction in diffusion capacity. Repeat CT scan in Jan 17 and June 17 shows stable fibrosis lung nodule and endobronchial lesion s/p bronchoscopy.  His chief complaint is dyspnea on exertion. This has been going on for the past 4-5 years, worsened over the past 1 year. He denies any cough, sputum, fevers, chills.   Outpatient Encounter Prescriptions as of 04/16/2016  Medication Sig  . atorvastatin (LIPITOR) 80 MG tablet Take 40 mg by mouth every evening.   . Cholecalciferol (VITAMIN D-3) 1000 UNITS CAPS Take 1 capsule by mouth daily.  . insulin aspart (NOVOLOG) 100 UNIT/ML injection Inject 0-5 Units into the skin 3 (three) times daily before meals. Sliding scale.  . insulin detemir (LEVEMIR) 100 UNIT/ML injection Inject 20-40 Units into the skin at bedtime. 40 units in the morning and 20 units in the evening. ( depending on how pt feels at the time of dose)  . lisinopril (PRINIVIL,ZESTRIL) 10 MG tablet Take 1 tablet (10 mg total) by mouth daily.  . metoprolol (LOPRESSOR) 50 MG  tablet Take 50 mg by mouth 2 (two) times daily.  Marland Kitchen omeprazole (PRILOSEC) 20 MG capsule Take 20 mg by mouth daily.   . vitamin B-12 (CYANOCOBALAMIN) 1000 MCG tablet Take 1,000 mcg by mouth daily.  Marland Kitchen warfarin (COUMADIN) 3 MG tablet Take 3 mg by mouth daily.   No facility-administered encounter medications on file as of 04/16/2016.     Allergies as of 04/16/2016 - Review Complete 04/16/2016  Allergen Reaction Noted  . Amiodarone Other (See Comments) 10/31/2015  . Nifedipine Other (See Comments) 05/11/2013    Past Medical History:  Diagnosis Date  . Bradycardia    a. during 02/2015 admission - occasional HR in 40s while being treated for atrial fib.  . Cervical disc disease   . Chronic diastolic CHF (congestive heart failure) ([place])    a. Dx 02/2015 -  2D echo 03/02/15 showed severe focal basal hypertrophy of the septum, EF 55-60%, mild MR, no effusion.  . CKD (chronic kidney disease), stage III   . Coronary artery calcification seen on CT scan    a. Nuc 02/2015 was normal.  . Dyslipidemia   . GERD (gastroesophageal reflux disease)   . Hypertension   . ILD (interstitial lung disease) ([place])    NSIP vs Amiodarone Induced Lung Injury  . PAF (paroxysmal atrial fibrillation) ([place])    a. Dx 02/2015 - in and out on tele, rx'd Coumadin and amiodarone.  . Pneumonia 2007  . Shortness of breath dyspnea    WITH EXERTION  . Type II diabetes mellitus ([place])     Past Surgical History:  Procedure Laterality Date  . CATARACT EXTRACTION W/ INTRAOCULAR LENS  IMPLANT, BILATERAL Bilateral   . CERVICAL DISC SURGERY     "i've had 3 neck ORs; not sure what kind; all thru the back of my neck"  . CHOLECYSTECTOMY    . COLONOSCOPY WITH PROPOFOL N/A 01/02/2016   Procedure: COLONOSCOPY WITH PROPOFOL;  Surgeon: [name], MD;  Location: [facility];  Service: Endoscopy;  Laterality: N/A;  . EYE SURGERY  1930's   "don't know what for" (05/11/2013)  . JOINT REPLACEMENT     bilateral knees, elbows,  and shoulders (on 05/11/2013 pt denies all joint replacements"   . KNEE ARTHROPLASTY     "had one scope; one open knee OR; not sure which on which side" (05/10/2013)  . KNEE ARTHROSCOPY     "had one scope; one open knee OR; not sure which on which side" (05/10/2013)  . POSTERIOR FUSION CERVICAL SPINE    . ROTATOR CUFF REPAIR Bilateral   . VIDEO BRONCHOSCOPY Bilateral 02/01/2016   Procedure: VIDEO BRONCHOSCOPY WITHOUT FLUORO;  Surgeon: Marshell [name], MD;  Location: [facility];  Service: Cardiopulmonary;  Laterality: Bilateral;    Family History  Problem Relation Age of Onset  . Diabetes Mellitus II Paternal Grandmother   . Heart disease Neg Hx     He does not know his father's history.   . Cancer Neg Hx   . Diabetes Neg Hx   . CAD Neg Hx   . Lung disease Neg Hx   . Rheumatologic disease Neg Hx     Social History   Social History  . Marital status: Married    Spouse name: Vaughan Basta  . Number of children: 0  . Years of education: N/A   Occupational History  . Retired from Architect work.    Social History Main Topics  . Smoking status: Never Smoker  . Smokeless tobacco: Former Systems developer    Types: Chew  . Alcohol use No  . Drug use: No     Comment: used chew when I was a teenager per patient   . Sexual activity: Not on file   Other Topics Concern  . Not on file   Social History Narrative   Married.  Lives with wife.  Ambulates independently.  Has a dog names is named Programmer, applications  .   Retired - worked for [employer] paper   No Sport and exercise psychologist Pulmonary:   No known bird, mold, or hot tub exposure.     Review of systems: Review of Systems  Constitutional: Negative for fever and chills.  HENT: Negative.   Eyes: Negative for blurred vision.  Respiratory: as per HPI  Cardiovascular: Negative for chest pain and palpitations.  Gastrointestinal: Negative for vomiting, diarrhea, blood per rectum. Genitourinary: Negative for dysuria, urgency, frequency and  hematuria.  Musculoskeletal: Negative for myalgias, back pain and joint pain.  Skin: Negative for itching and rash.  Neurological: Negative for dizziness, tremors, focal weakness, seizures and loss of consciousness.  Endo/Heme/Allergies: Negative for environmental allergies.  Psychiatric/Behavioral: Negative for depression, suicidal ideas and hallucinations.  All other systems reviewed and are negative.   Physical Exam: Blood pressure (!) 170/74, pulse (!) 52, weight 203 lb (92.1 kg), SpO2 95 %. Gen:      No acute distress HEENT:  EOMI, sclera anicteric Neck:     No masses; no thyromegaly Lungs:    Clear to auscultation bilaterally; normal respiratory effort CV:  Regular rate and rhythm; no murmurs Abd:      + bowel sounds; soft, non-tender; no palpable masses, no distension Ext:    No edema; adequate peripheral perfusion Skin:      Warm and dry; no rash Neuro: alert and oriented x 3 Psych: normal mood and affect  Data Reviewed: PFTs 04/26/15 FVC 1.59 [52%) FEV1 1.25 (59%) F/F 79 TLC 2.69 [45%) DLCO 53% Severe restriction, moderate reduction in diffusion capacity. Diffusion capacity corrects for alveolar volume.  Imaging CTA 02/28/15 No PE, scarring, atelectasis at lung bases.  CT chest 06/12/15 Images reviewed. Subpleural reticulation, groundglass, 7 mm right lower lobe nodule.  CT high res 10/31/15 1. The appearance of the lungs appears very similar to the prior examination and is favored to reflect mild nonspecific interstitial pneumonia (NSIP). 2. 6 mm endobronchial nodule associated with the lateral margin of the proximal bronchus intermedius, similar to the prior study. This could represent an endobronchial polyp or other neoplasm. Correlation with bronchoscopy for direct inspection and potential biopsy is suggested if clinically appropriate. 3. Stable subpleural nodule with mean diameter of 6 mm. All images reviewed.  LABS 07/14/15 ANA:  Negative Centromere Ab Screen:  <1.0 Anti-CCP:  >250 RF:  <10 Smith Ab:  <1.0 DS DNA Ab:  <1 Jo-1 Ab:  <1.0 SSA:  <1.0 SSB:  <1.0 SCL-70:  <1.0 RNP:  <1.0  10/30/15 ESR: 38 CRP: 14.9 ANA: Neg RF:16.4 Anti-CCP : 107  Assessment:  #1 IPAF (intersitital pneumonia with autoimmune features), NSIP He was hospitalized in June of this year with respiratory failure. He was treated with antibiotics and prednisone with improvement in symptoms. He is currently off prednisone. Repeat labs show positive CCP but does not have any joint symptoms, no synovitis suggestive of overt rheumatoid arthritis. If there is progression then he will need a more prolonged course of immunosuppression. We will follow with repeat high res CT scan.  There was a consideration for amiodarone toxicity as Mr. Campanaro was been on amiodarone for a short time after he was admitted for heart failure, onset A. Fib in Oct 2016. His PFT show restriction and reduction in diffusion capacity. However the diffusion capacity corrects for alveolar volume. CT scan does show lower lobe changes that may represent NSIP but the extent of the changes appear very mild and not much different from CT images before he was started on amiodarone so suspicion for it is low. He has since been off the amiodarone since  December 2016.   #2 Multiple endobronchial lesions S/p bronchoscopy in sept 14. The appearance of the lesion is suggestive of benign process and may be related to his autoimmune process. Brushings were negative for malignancy. We'll continue to follow this on repeat CT scans. He needed a repeat CT scan in a few months time. She wants to hold off on this until he gets an ENT evaluation as he has symptoms of vertigo on lying down.   Plan/Recommendations: - Continue monitoring of CT scan, PFTs and lung symptoms to determine if he needs to go back on immunosuppression - Follow lung nodule and endobronchial lesion on repeat CT  scan.  Marshell [name] MD [facility] Pager [phone] 04/16/2016, 2:07 PM  CC: Lavone Orn, MD

## 2016-04-16 NOTE — Patient Instructions (Signed)
We will follow with you in 6 months. Please let us know after you have had the ENT visit so that we can order a CT of the chest to follow up.

## 2016-04-19 DIAGNOSIS — H6123 Impacted cerumen, bilateral: Secondary | ICD-10-CM | POA: Diagnosis not present

## 2016-06-10 ENCOUNTER — Ambulatory Visit (INDEPENDENT_AMBULATORY_CARE_PROVIDER_SITE_OTHER): Payer: BLUE CROSS/BLUE SHIELD | Admitting: Podiatry

## 2016-06-10 ENCOUNTER — Encounter: Payer: Self-pay | Admitting: Podiatry

## 2016-06-10 VITALS — BP 174/72 | HR 63

## 2016-06-10 DIAGNOSIS — L603 Nail dystrophy: Secondary | ICD-10-CM | POA: Diagnosis not present

## 2016-06-10 DIAGNOSIS — B351 Tinea unguium: Secondary | ICD-10-CM

## 2016-06-10 DIAGNOSIS — L608 Other nail disorders: Secondary | ICD-10-CM

## 2016-06-10 DIAGNOSIS — M79609 Pain in unspecified limb: Secondary | ICD-10-CM

## 2016-06-10 DIAGNOSIS — E0843 Diabetes mellitus due to underlying condition with diabetic autonomic (poly)neuropathy: Secondary | ICD-10-CM

## 2016-06-16 NOTE — Progress Notes (Signed)
   SUBJECTIVE Patient with a history of diabetes mellitus presents to office today complaining of elongated, thickened nails. Pain while ambulating in shoes. Patient is unable to trim their own nails.   OBJECTIVE General Patient is awake, alert, and oriented x 3 and in no acute distress. Derm Skin is dry and supple bilateral. Negative open lesions or macerations. Remaining integument unremarkable. Nails are tender, long, thickened and dystrophic with subungual debris, consistent with onychomycosis, 1-5 bilateral. No signs of infection noted. Vasc  DP and PT pedal pulses palpable bilaterally. Temperature gradient within normal limits.  Neuro Epicritic and protective threshold sensation diminished bilaterally.  Musculoskeletal Exam No symptomatic pedal deformities noted bilateral. Muscular strength within normal limits.  ASSESSMENT 1. Diabetes Mellitus w/ peripheral neuropathy 2. Onychomycosis of nail due to dermatophyte bilateral 3. Pain in foot bilateral  PLAN OF CARE 1. Patient evaluated today. 2. Instructed to maintain good pedal hygiene and foot care. Stressed importance of controlling blood sugar.  3. Mechanical debridement of nails 1-5 bilaterally performed using a nail nipper. Filed with dremel without incident.  4. Return to clinic in 3 mos.     Ovadia Lopp M. Chilton Sallade, DPM [facility]  Dr. Azyiah Bo M. Julieana Eshleman, DPM    [address]                                        [place], [place] [address]                Office [phone]  Fax [phone]       

## 2016-06-25 DIAGNOSIS — E1139 Type 2 diabetes mellitus with other diabetic ophthalmic complication: Secondary | ICD-10-CM | POA: Diagnosis not present

## 2016-06-25 DIAGNOSIS — I129 Hypertensive chronic kidney disease with stage 1 through stage 4 chronic kidney disease, or unspecified chronic kidney disease: Secondary | ICD-10-CM | POA: Diagnosis not present

## 2016-06-25 DIAGNOSIS — Z7901 Long term (current) use of anticoagulants: Secondary | ICD-10-CM | POA: Diagnosis not present

## 2016-06-25 DIAGNOSIS — I5030 Unspecified diastolic (congestive) heart failure: Secondary | ICD-10-CM | POA: Diagnosis not present

## 2016-06-25 DIAGNOSIS — E1122 Type 2 diabetes mellitus with diabetic chronic kidney disease: Secondary | ICD-10-CM | POA: Diagnosis not present

## 2016-06-25 DIAGNOSIS — I480 Paroxysmal atrial fibrillation: Secondary | ICD-10-CM | POA: Diagnosis not present

## 2016-06-25 DIAGNOSIS — Z794 Long term (current) use of insulin: Secondary | ICD-10-CM | POA: Diagnosis not present

## 2016-06-25 DIAGNOSIS — N183 Chronic kidney disease, stage 3 (moderate): Secondary | ICD-10-CM | POA: Diagnosis not present

## 2016-06-25 DIAGNOSIS — J849 Interstitial pulmonary disease, unspecified: Secondary | ICD-10-CM | POA: Diagnosis not present

## 2016-06-25 DIAGNOSIS — E113519 Type 2 diabetes mellitus with proliferative diabetic retinopathy with macular edema, unspecified eye: Secondary | ICD-10-CM | POA: Diagnosis not present

## 2016-06-25 DIAGNOSIS — K219 Gastro-esophageal reflux disease without esophagitis: Secondary | ICD-10-CM | POA: Diagnosis not present

## 2016-06-25 DIAGNOSIS — H9190 Unspecified hearing loss, unspecified ear: Secondary | ICD-10-CM | POA: Diagnosis not present

## 2016-07-05 DIAGNOSIS — H8110 Benign paroxysmal vertigo, unspecified ear: Secondary | ICD-10-CM | POA: Diagnosis not present

## 2016-07-05 DIAGNOSIS — H8111 Benign paroxysmal vertigo, right ear: Secondary | ICD-10-CM | POA: Diagnosis not present

## 2016-07-05 DIAGNOSIS — H903 Sensorineural hearing loss, bilateral: Secondary | ICD-10-CM | POA: Diagnosis not present

## 2016-07-10 ENCOUNTER — Ambulatory Visit: Payer: BLUE CROSS/BLUE SHIELD | Attending: Otolaryngology | Admitting: Rehabilitative and Restorative Service Providers"

## 2016-07-10 ENCOUNTER — Telehealth: Payer: Self-pay | Admitting: Rehabilitative and Restorative Service Providers"

## 2016-07-10 DIAGNOSIS — H8111 Benign paroxysmal vertigo, right ear: Secondary | ICD-10-CM | POA: Insufficient documentation

## 2016-07-10 NOTE — Telephone Encounter (Signed)
Left message for Justin Leach at [facility] ENT.  Patient experienced significant nausea during attempted modified Epley's and could not complete intervention.  Requested Dr. Redmond Baseman determine if patient could benefit from nausea medications to tolerate tx for BPPV.    Asli Tokarski, PT

## 2016-07-11 NOTE — Therapy (Signed)
[facility] [address] [place], [place], [address] Phone: [phone]   Fax:  [phone]  Physical Therapy Evaluation  Patient Details  Name: Justin Leach MRN: [id number] Date of Birth: 1932/04/01 Referring Provider: Melida Quitter, MD  Encounter Date: 07/10/2016      PT End of Session - 07/10/16 1547    Visit Number 1   Number of Visits 8   Date for PT Re-Evaluation 08/10/16   Authorization Type G code every 10th visit   PT Start Time 1320   PT Stop Time 1405   PT Time Calculation (min) 45 min   Activity Tolerance Other (comment)  limited by nausea   Behavior During Therapy Anxious      Past Medical History:  Diagnosis Date  . Bradycardia    a. during 02/2015 admission - occasional HR in 40s while being treated for atrial fib.  . Cervical disc disease   . Chronic diastolic CHF (congestive heart failure) ([place])    a. Dx 02/2015 -  2D echo 03/02/15 showed severe focal basal hypertrophy of the septum, EF 55-60%, mild MR, no effusion.  . CKD (chronic kidney disease), stage III   . Coronary artery calcification seen on CT scan    a. Nuc 02/2015 was normal.  . Dyslipidemia   . GERD (gastroesophageal reflux disease)   . Hypertension   . ILD (interstitial lung disease) ([place])    NSIP vs Amiodarone Induced Lung Injury  . PAF (paroxysmal atrial fibrillation) ([place])    a. Dx 02/2015 - in and out on tele, rx'd Coumadin and amiodarone.  . Pneumonia 2007  . Shortness of breath dyspnea    WITH EXERTION  . Type II diabetes mellitus ([place])     Past Surgical History:  Procedure Laterality Date  . CATARACT EXTRACTION W/ INTRAOCULAR LENS  IMPLANT, BILATERAL Bilateral   . CERVICAL DISC SURGERY     "i've had 3 neck ORs; not sure what kind; all thru the back of my neck"  . CHOLECYSTECTOMY    . COLONOSCOPY WITH PROPOFOL N/A 01/02/2016   Procedure: COLONOSCOPY WITH PROPOFOL;  Surgeon: [name], MD;  [facility];  Service: Endoscopy;  Laterality: N/A;  . EYE SURGERY  1930's   "don't know what for" (05/11/2013)  . JOINT REPLACEMENT     bilateral knees, elbows, and shoulders (on 05/11/2013 pt denies all joint replacements"   . KNEE ARTHROPLASTY     "had one scope; one open knee OR; not sure which on which side" (05/10/2013)  . KNEE ARTHROSCOPY     "had one scope; one open knee OR; not sure which on which side" (05/10/2013)  . POSTERIOR FUSION CERVICAL SPINE    . ROTATOR CUFF REPAIR Bilateral   . VIDEO BRONCHOSCOPY Bilateral 02/01/2016   Procedure: VIDEO BRONCHOSCOPY WITHOUT FLUORO;  Surgeon: Marshell [name], MD;  [facility];  Service: Cardiopulmonary;  Laterality: Bilateral;    There were no vitals filed for this visit.       Subjective Assessment - 07/10/16 1326    Subjective The patient reports room spinning 83 years ago and notes he hasn't slept in a bed for 81+ years.    Pertinent History He reports h/o CHF and neck surgery.     Patient Stated Goals Reduce vertigo   Currently in Pain? No/denies            [facility] PT Assessment - 07/10/16 1329      Assessment   Medical  Diagnosis R BPPV posterior canal   Referring Provider Melida Quitter, MD   Onset Date/Surgical Date --  began 83 years ago   Prior Therapy none     Precautions   Precautions Fall     Restrictions   Weight Bearing Restrictions No     Balance Screen   Has the patient fallen in the past 6 months Yes   How many times? 1, while walking in slippers   Has the patient had a decrease in activity level because of a fear of falling?  No   Is the patient reluctant to leave their home because of a fear of falling?  No     Home Environment   Living Environment Private residence   Living Arrangements Spouse/significant other   Type of [place] - single point   Additional Comments Patient is between 2 homes due to  inheriting his mother's residence.     Prior Function   Level of Independence Independent with basic ADLs;Independent with household mobility with device   Leisure Was walking 2.5-3 miles/day prior to hospital admission 81/2016 due to CHF.     ROM / Strength   AROM / PROM / Strength AROM     AROM   Overall AROM  Deficits   AROM Assessment Site Cervical   Cervical Flexion 32   Cervical Extension 20   Cervical - Right Rotation 40   Cervical - Left Rotation 42     Ambulation/Gait   Ambulation/Gait Yes   Ambulation/Gait Assistance 6: Modified independent (Device/Increase time)   Ambulation Distance (Feet) 200 Feet   Assistive device Straight cane   Ambulation Surface Level   Gait velocity 1.80 ft/sec            Vestibular Assessment - 07/10/16 1332      Vestibular Assessment   General Observation "I haven't laid down in 83 years"     Symptom Behavior   Type of Dizziness Spinning   Frequency of Dizziness intermittent   Duration of Dizziness seconds, he notes he comes out of position   Aggravating Factors Lying supine   Relieving Factors Head stationary     Occulomotor Exam   Occulomotor Alignment Normal  wears glasses/bifocals   Spontaneous Absent   Gaze-induced Absent   Smooth Pursuits Intact     Vestibulo-Occular Reflex   VOR 1 Head Only (x 1 viewing) Patient performs slow movement- neck hinders full VOR assessment.     Positional Testing   Dix-Hallpike Dix-Hallpike Right     Dix-Hallpike Right   Dix-Hallpike Right Duration 30+ seconds, patient closes eyes, but direction is able to be determined   Dix-Hallpike Right Symptoms Upbeat, right rotatory nystagmus                Vestibular Treatment/Exercise - 07/10/16 1402      Vestibular Treatment/Exercise   Vestibular Treatment Provided Canalith Repositioning   Canalith Repositioning Epley Manuever Right      EPLEY MANUEVER RIGHT   Number of Reps  2   Response Details  PT modified patient's  position to use 2 pillows under upper back in order to have support for head/neck.  Patient notes severe nausea and cannot tolerate continued treatment.  Tried 2 times, and unable to tolerate.  He c/o nausea x 10 minutes post attempt.  PT Short Term Goals - 07/11/16 1548      PT SHORT TERM GOAL #1   Title STGs=LTGs           PT Long Term Goals - 07/11/16 1548      PT LONG TERM GOAL #1   Title The patient will have negative positional testing indicating resolution of BPPV.   Baseline Target date 08/08/2016   Time 4   Period Weeks     PT LONG TERM GOAL #2   Title The patient will demonstrate getting into/out of bed moving sit<>supine without subjective c/o vertigo.   Baseline Target date 08/08/2016   Time 4   Period Weeks     PT LONG TERM GOAL #3   Title The patient will be further assessed on balance/gait activities as indicated once BPPv cleared.   Baseline Target date 08/08/2016   Time 4   Period Weeks               Plan - 07/10/16 1549    Clinical Impression Statement The patient is an 83 yo male presenting with R posterior canalithiasis BPPV, neck limitations, nausea, and inability to perform bed mobility due to vertigo.  He ambulates with a SPC mod indep after sitting to allow vertigo symptoms to clear before ambulating.  He did not tolerate Epley's maneuver today reporting nausea (no vomitting) and requests to end treatment. PT contacted MD office to inquire about need for nausea meds for patient to tolerate tx for BPPV.  Left message.    Rehab Potential Good   PT Frequency 2x / week   PT Duration 4 weeks   PT Treatment/Interventions ADLs/Self Care Home Management;Therapeutic activities;Therapeutic exercise;Vestibular;Canalith Repostioning;Gait training;Patient/family education;Functional mobility training;Neuromuscular re-education;Balance training   PT Next Visit Plan Modified Epley's maneuver as tolerated, modify brandt daroff if unable  to tolerate Epley's   Consulted and Agree with Plan of Care Patient      Patient will benefit from skilled therapeutic intervention in order to improve the following deficits and impairments:  Abnormal gait, Decreased balance, Dizziness  Visit Diagnosis: BPPV (benign paroxysmal positional vertigo), right     Problem List Patient Active Problem List   Diagnosis Date Noted  . Cough   . GERD (gastroesophageal reflux disease)   . Pulmonary fibrosis ([place])   . Bronchitis 10/30/2015  . Coronary artery calcification seen on CT scan   . Chronic diastolic CHF (congestive heart failure) ([place])   . PAF (paroxysmal atrial fibrillation) ([place])   . CKD (chronic kidney disease), stage III   . Type II diabetes mellitus ([place])   . Essential hypertension   . Acute diastolic heart failure ([place])   . Atrial fibrillation, new onset ([place]) 03/02/2015  . Chest pain 02/28/2015  . Renal failure (ARF), acute on chronic ([place]) 02/28/2015  . Hyperlipidemia 02/28/2015  . Dyspnea 03/29/2014  . DOE (dyspnea on exertion) 03/29/2014  . Bradycardia 03/29/2014  . Bronchospasm 05/15/2013  . Influenza with respiratory manifestations 05/12/2013    Naveed Humphres, PT 07/11/2016, 3:52 PM  [facility] [address] [place], [place], [address] Phone: [phone]   Fax:  [phone]  Name: JANI BROUHARD MRN: [id number] Date of Birth: [birth year]

## 2016-07-22 ENCOUNTER — Ambulatory Visit: Payer: BLUE CROSS/BLUE SHIELD | Admitting: Rehabilitative and Restorative Service Providers"

## 2016-07-25 ENCOUNTER — Encounter: Payer: BLUE CROSS/BLUE SHIELD | Admitting: Rehabilitative and Restorative Service Providers"

## 2016-07-29 ENCOUNTER — Encounter: Payer: BLUE CROSS/BLUE SHIELD | Admitting: Rehabilitative and Restorative Service Providers"

## 2016-07-31 ENCOUNTER — Ambulatory Visit: Payer: BLUE CROSS/BLUE SHIELD | Admitting: Rehabilitative and Restorative Service Providers"

## 2016-08-01 ENCOUNTER — Encounter: Payer: BLUE CROSS/BLUE SHIELD | Admitting: Rehabilitative and Restorative Service Providers"

## 2016-08-26 DIAGNOSIS — L8990 Pressure ulcer of unspecified site, unspecified stage: Secondary | ICD-10-CM | POA: Diagnosis not present

## 2016-08-26 DIAGNOSIS — L814 Other melanin hyperpigmentation: Secondary | ICD-10-CM | POA: Diagnosis not present

## 2016-08-26 DIAGNOSIS — L821 Other seborrheic keratosis: Secondary | ICD-10-CM | POA: Diagnosis not present

## 2016-08-26 DIAGNOSIS — D225 Melanocytic nevi of trunk: Secondary | ICD-10-CM | POA: Diagnosis not present

## 2016-09-10 ENCOUNTER — Ambulatory Visit: Payer: Medicare Other | Admitting: Podiatry

## 2016-09-12 ENCOUNTER — Ambulatory Visit (INDEPENDENT_AMBULATORY_CARE_PROVIDER_SITE_OTHER): Payer: Medicare Other

## 2016-09-12 ENCOUNTER — Ambulatory Visit (INDEPENDENT_AMBULATORY_CARE_PROVIDER_SITE_OTHER): Payer: Medicare Other | Admitting: Podiatry

## 2016-09-12 ENCOUNTER — Encounter: Payer: Self-pay | Admitting: Podiatry

## 2016-09-12 DIAGNOSIS — M1000 Idiopathic gout, unspecified site: Secondary | ICD-10-CM | POA: Diagnosis not present

## 2016-09-12 DIAGNOSIS — M79672 Pain in left foot: Secondary | ICD-10-CM | POA: Diagnosis not present

## 2016-09-12 DIAGNOSIS — M779 Enthesopathy, unspecified: Secondary | ICD-10-CM | POA: Diagnosis not present

## 2016-09-12 DIAGNOSIS — M7752 Other enthesopathy of left foot: Secondary | ICD-10-CM

## 2016-09-12 DIAGNOSIS — M7750 Other enthesopathy of unspecified foot: Secondary | ICD-10-CM

## 2016-09-12 MED ORDER — HYDROCODONE-ACETAMINOPHEN 10-325 MG PO TABS: 1.0000 | ORAL_TABLET | Freq: Three times a day (TID) | ORAL | 0 refills | Status: DC | PRN

## 2016-09-12 MED ORDER — TRIAMCINOLONE ACETONIDE 10 MG/ML IJ SUSP
10.0000 mg | Freq: Once | INTRAMUSCULAR | Status: AC
  Administered 2016-09-12: 10 mg

## 2016-09-12 NOTE — Patient Instructions (Signed)

## 2016-09-14 NOTE — Progress Notes (Signed)
Subjective:    Patient ID: Justin Leach, male   DOB: 83 y.o.   MRN: [id number]   HPI patient presents stating he's having a lot of pain in his left ankle and it's been hard for him to walk and they're not sure of an injury    ROS      Objective:  Physical Exam Neurovascular status was found to be intact muscle strength was adequate with quite a bit of inflammation around the posterior tibial insertion left with redness also noted an acute nature to this condition. No indication there is a tear of the tendon and is very painful when pressed    Assessment:    Probability for acute gout attack with inflammatory tendinitis of the left ankle which is secondary     Plan:    H&P conditions reviewed and gout was explained to patient and I did give him paperwork concerning gout. Today I did a careful injection of the medial ankle into the tendon sheath 3 mg Kenalog 5 mg Xylocaine and advised on ice therapy and supportive shoe gear therapy. Patient be seen back to recheck   X-ray indicates that there is mild osteoporosis arthritis with no indications of collapsed arch

## 2016-10-24 DIAGNOSIS — Z794 Long term (current) use of insulin: Secondary | ICD-10-CM | POA: Diagnosis not present

## 2016-10-24 DIAGNOSIS — N183 Chronic kidney disease, stage 3 (moderate): Secondary | ICD-10-CM | POA: Diagnosis not present

## 2016-10-24 DIAGNOSIS — I480 Paroxysmal atrial fibrillation: Secondary | ICD-10-CM | POA: Diagnosis not present

## 2016-10-24 DIAGNOSIS — K219 Gastro-esophageal reflux disease without esophagitis: Secondary | ICD-10-CM | POA: Diagnosis not present

## 2016-10-24 DIAGNOSIS — E1122 Type 2 diabetes mellitus with diabetic chronic kidney disease: Secondary | ICD-10-CM | POA: Diagnosis not present

## 2016-10-24 DIAGNOSIS — I129 Hypertensive chronic kidney disease with stage 1 through stage 4 chronic kidney disease, or unspecified chronic kidney disease: Secondary | ICD-10-CM | POA: Diagnosis not present

## 2016-10-24 DIAGNOSIS — I5030 Unspecified diastolic (congestive) heart failure: Secondary | ICD-10-CM | POA: Diagnosis not present

## 2016-10-24 DIAGNOSIS — E113519 Type 2 diabetes mellitus with proliferative diabetic retinopathy with macular edema, unspecified eye: Secondary | ICD-10-CM | POA: Diagnosis not present

## 2016-10-24 DIAGNOSIS — Z7901 Long term (current) use of anticoagulants: Secondary | ICD-10-CM | POA: Diagnosis not present

## 2016-10-29 ENCOUNTER — Encounter: Payer: Self-pay | Admitting: Rehabilitative and Restorative Service Providers"

## 2016-10-29 NOTE — Therapy (Signed)
[facility] [address] [place], [place], [address] Phone: [phone]   Fax:  [phone]  Patient Details  Name: JOSEANGEL NETTLETON MRN: [id number] Date of Birth: [birth year] Referring Provider:  No ref. provider found  Encounter Date: last encounter 07/10/16  The patient was evaluated on 07/10/16 and did not return for PT.  See initial evaluation for patient status.  Thank you for the referral of this patient. Rudell Cobb, MPT    Eboni Coval 10/29/2016, 10:09 AM  [facility] [address] [place], [place], [address] Phone: [phone]   Fax:  [phone]

## 2016-11-09 ENCOUNTER — Emergency Department (HOSPITAL_COMMUNITY)
Admission: EM | Admit: 2016-11-09 | Discharge: 2016-11-09 | Disposition: A | Discharge status: Home / Self Care | Payer: BLUE CROSS/BLUE SHIELD | Attending: Emergency Medicine | Admitting: Emergency Medicine

## 2016-11-09 ENCOUNTER — Encounter (HOSPITAL_COMMUNITY): Payer: Self-pay | Admitting: Emergency Medicine

## 2016-11-09 DIAGNOSIS — Z7901 Long term (current) use of anticoagulants: Secondary | ICD-10-CM | POA: Insufficient documentation

## 2016-11-09 DIAGNOSIS — I130 Hypertensive heart and chronic kidney disease with heart failure and stage 1 through stage 4 chronic kidney disease, or unspecified chronic kidney disease: Secondary | ICD-10-CM | POA: Diagnosis not present

## 2016-11-09 DIAGNOSIS — E1122 Type 2 diabetes mellitus with diabetic chronic kidney disease: Secondary | ICD-10-CM | POA: Diagnosis not present

## 2016-11-09 DIAGNOSIS — N183 Chronic kidney disease, stage 3 (moderate): Secondary | ICD-10-CM | POA: Insufficient documentation

## 2016-11-09 DIAGNOSIS — I5032 Chronic diastolic (congestive) heart failure: Secondary | ICD-10-CM | POA: Diagnosis not present

## 2016-11-09 DIAGNOSIS — R197 Diarrhea, unspecified: Secondary | ICD-10-CM | POA: Diagnosis not present

## 2016-11-09 DIAGNOSIS — Z794 Long term (current) use of insulin: Secondary | ICD-10-CM | POA: Diagnosis not present

## 2016-11-09 DIAGNOSIS — Z96653 Presence of artificial knee joint, bilateral: Secondary | ICD-10-CM | POA: Diagnosis not present

## 2016-11-09 DIAGNOSIS — Z79899 Other long term (current) drug therapy: Secondary | ICD-10-CM | POA: Diagnosis not present

## 2016-11-09 LAB — CBC WITH DIFFERENTIAL/PLATELET
Basophils Absolute: 0.1 10*3/uL (ref 0.0–0.1)
Basophils Relative: 1 %
Eosinophils Absolute: 0.6 10*3/uL (ref 0.0–0.7)
Eosinophils Relative: 5 %
HCT: 45.9 % (ref 39.0–52.0)
Hemoglobin: 15.2 g/dL (ref 13.0–17.0)
Lymphocytes Relative: 16 %
Lymphs Abs: 1.9 10*3/uL (ref 0.7–4.0)
MCH: 31.3 pg (ref 26.0–34.0)
MCHC: 33.1 g/dL (ref 30.0–36.0)
MCV: 94.4 fL (ref 78.0–100.0)
Monocytes Absolute: 1.2 10*3/uL — ABNORMAL HIGH (ref 0.1–1.0)
Monocytes Relative: 10 %
Neutro Abs: 8.1 10*3/uL — ABNORMAL HIGH (ref 1.7–7.7)
Neutrophils Relative %: 68 %
Platelets: 192 10*3/uL (ref 150–400)
RBC: 4.86 MIL/uL (ref 4.22–5.81)
RDW: 15.1 % (ref 11.5–15.5)
WBC: 11.9 10*3/uL — ABNORMAL HIGH (ref 4.0–10.5)

## 2016-11-09 LAB — URINALYSIS, ROUTINE W REFLEX MICROSCOPIC
Bilirubin Urine: NEGATIVE
Glucose, UA: NEGATIVE mg/dL
Hgb urine dipstick: NEGATIVE
Ketones, ur: NEGATIVE mg/dL
Leukocytes, UA: NEGATIVE
Nitrite: NEGATIVE
Protein, ur: 30 mg/dL — AB
Specific Gravity, Urine: 1.018 (ref 1.005–1.030)
pH: 5 (ref 5.0–8.0)

## 2016-11-09 LAB — COMPREHENSIVE METABOLIC PANEL
ALT: 25 U/L (ref 17–63)
AST: 31 U/L (ref 15–41)
Albumin: 3.7 g/dL (ref 3.5–5.0)
Alkaline Phosphatase: 120 U/L (ref 38–126)
Anion gap: 6 (ref 5–15)
BUN: 30 mg/dL — ABNORMAL HIGH (ref 6–20)
CO2: 29 mmol/L (ref 22–32)
Calcium: 9.3 mg/dL (ref 8.9–10.3)
Chloride: 106 mmol/L (ref 101–111)
Creatinine, Ser: 1.98 mg/dL — ABNORMAL HIGH (ref 0.61–1.24)
GFR calc Af Amer: 34 mL/min — ABNORMAL LOW (ref >60)
GFR calc non Af Amer: 30 mL/min — ABNORMAL LOW (ref >60)
Glucose, Bld: 163 mg/dL — ABNORMAL HIGH (ref 65–99)
Potassium: 5.2 mmol/L — ABNORMAL HIGH (ref 3.5–5.1)
Sodium: 141 mmol/L (ref 135–145)
Total Bilirubin: 0.5 mg/dL (ref 0.3–1.2)
Total Protein: 6.7 g/dL (ref 6.5–8.1)

## 2016-11-09 LAB — POC OCCULT BLOOD, ED: Fecal Occult Bld: NEGATIVE

## 2016-11-09 LAB — PROTIME-INR
INR: 2.14
Prothrombin Time: 24.2 seconds — ABNORMAL HIGH (ref 11.4–15.2)

## 2016-11-09 LAB — LIPASE, BLOOD: Lipase: 21 U/L (ref 11–51)

## 2016-11-09 MED ORDER — LOPERAMIDE HCL 2 MG PO CAPS: 2.0000 mg | ORAL_CAPSULE | Freq: Four times a day (QID) | ORAL | 0 refills | Status: DC | PRN

## 2016-11-09 MED ORDER — SODIUM CHLORIDE 0.9 % IV BOLUS (SEPSIS)
500.0000 mL | Freq: Once | INTRAVENOUS | Status: AC
  Administered 2016-11-09: 500 mL via INTRAVENOUS

## 2016-11-09 MED ORDER — LOPERAMIDE HCL 2 MG PO CAPS
4.0000 mg | ORAL_CAPSULE | Freq: Once | ORAL | Status: AC
  Administered 2016-11-09: 4 mg via ORAL
  Filled 2016-11-09: qty 2

## 2016-11-09 NOTE — Discharge Instructions (Signed)
Take Imodium for the next 48 hours for diarrhea. Eat bland foods that will not upset the stomach. Follow-up with your primary care physician in 2-3 days for reevaluation. Return to the ED if any concerning signs or symptoms develop.

## 2016-11-09 NOTE — ED Triage Notes (Signed)
Pt from home with complaints of diarrhea and bloody in his stool since Wednesday. Pt states he ate salmon on Wednesday and he has had these symptoms since then. Pt's wife states she ate the same fish and has not had these symptoms. Pt states he takes warfarin. Pt denies lightheadedness or dizziness.

## 2016-11-09 NOTE — ED Notes (Signed)
Pt have tried to void but couldn't

## 2016-11-09 NOTE — ED Provider Notes (Signed)
[facility] DEPT Provider Note   CSN: [id number] Arrival date & time: 11/09/16  1336     History   Chief Complaint Chief Complaint  Patient presents with  . GI Bleeding    HPI Justin Leach is a 83 y.o. male with history of chronic diastolic CHF, CKD stage III, DM type II, HTN, PAF and GERD who presents today with chief complaint acute onset, progressively worsening diarrhea for 3 days. He states that he had salmon for dinner on Wednesday without issues. States that on Thursday 3 days ago he started developing multiple episodes of watery, bloody stools. He denies abdominal pain, vomiting, fevers, chills. He does endorse intermittent mild nausea. States he has had reduced oral intake, but is tolerating fluids. He has not had his insulin today since he is not eating. Denies travel, known sick contacts, recent antibiotic use, or suspicious food intake. He is on warfarin for his a-fib. Denies dysuria, hematuria** The history is provided by the patient.    Past Medical History:  Diagnosis Date  . Bradycardia    a. during 02/2015 admission - occasional HR in 40s while being treated for atrial fib.  . Cervical disc disease   . Chronic diastolic CHF (congestive heart failure) ([place])    a. Dx 02/2015 -  2D echo 03/02/15 showed severe focal basal hypertrophy of the septum, EF 55-60%, mild MR, no effusion.  . CKD (chronic kidney disease), stage III   . Coronary artery calcification seen on CT scan    a. Nuc 02/2015 was normal.  . Dyslipidemia   . GERD (gastroesophageal reflux disease)   . Hypertension   . 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 PAF (paroxysmal atrial fibrillation) ([place])    a. Dx 02/2015 - in and out on tele, rx'd Coumadin and amiodarone.  . Pneumonia 2007  . Shortness of breath dyspnea    WITH EXERTION  . Type II diabetes mellitus [facility])     Patient Active Problem List   Diagnosis Date Noted  . Cough   . GERD  (gastroesophageal reflux disease)   . Pulmonary fibrosis ([place])   . Bronchitis 10/30/2015  . Coronary artery calcification seen on CT scan   . Chronic diastolic CHF (congestive heart failure) ([place])   . PAF (paroxysmal atrial fibrillation) ([employer])   . CKD (chronic kidney disease), stage III   . Type II diabetes mellitus ([place])   . Essential hypertension   . Acute diastolic heart failure ()   . Atrial fibrillation, new onset ([place]) 03/02/2015  . Chest pain 02/28/2015  . Renal failure (ARF), acute on chronic ([place]) 02/28/2015  . Hyperlipidemia 02/28/2015  . Dyspnea 03/29/2014  . DOE (dyspnea on exertion) 03/29/2014  . Bradycardia 03/29/2014  . Bronchospasm 05/15/2013  . Influenza with respiratory manifestations 05/12/2013    Past Surgical History:  Procedure Laterality Date  . CATARACT EXTRACTION W/ INTRAOCULAR LENS  IMPLANT, BILATERAL Bilateral   . CERVICAL DISC SURGERY     "i've had 3 neck ORs; not sure what kind; all thru the back of my neck"  . CHOLECYSTECTOMY    . COLONOSCOPY WITH PROPOFOL N/A 01/02/2016   Procedure: COLONOSCOPY WITH PROPOFOL;  Surgeon: [name], MD;  Location: [facility] ENDOSCOPY;  Service: Endoscopy;  Laterality: N/A;  . EYE SURGERY  1930's   "don't know what for" (05/11/2013)  . JOINT REPLACEMENT     bilateral knees, elbows, and shoulders (on 05/11/2013 pt denies all joint  replacements"   . KNEE ARTHROPLASTY     "had one scope; one open knee OR; not sure which on which side" (05/10/2013)  . KNEE ARTHROSCOPY     "had one scope; one open knee OR; not sure which on which side" (05/10/2013)  . POSTERIOR FUSION CERVICAL SPINE    . ROTATOR CUFF REPAIR Bilateral   . VIDEO BRONCHOSCOPY Bilateral 02/01/2016   Procedure: VIDEO BRONCHOSCOPY WITHOUT FLUORO;  Surgeon: Marshell [name], MD;  Location: [facility] ENDOSCOPY;  Service: Cardiopulmonary;  Laterality: Bilateral;       Home Medications    Prior to Admission medications   Medication Sig Start Date End Date Taking?  Authorizing Provider  atorvastatin (LIPITOR) 80 MG tablet Take 40 mg by mouth every evening.  12/27/13  Yes [provider]  Cholecalciferol (VITAMIN D-3) 1000 UNITS CAPS Take 1 capsule by mouth daily.   Yes [provider]  insulin aspart (NOVOLOG) 100 UNIT/ML injection Inject 0-5 Units into the skin 3 (three) times daily before meals. Sliding scale.   Yes [provider]  insulin detemir (LEVEMIR) 100 UNIT/ML injection Inject 40-60 Units into the skin at bedtime. 60 units in the morning and 40 units in the evening. ( depending on how pt feels at the time of dose)   Yes [provider]  lisinopril (PRINIVIL,ZESTRIL) 10 MG tablet Take 1 tablet (10 mg total) by mouth daily. 07/17/15  Yes Mannam, Praveen, MD  metoprolol (LOPRESSOR) 50 MG tablet Take 50 mg by mouth 2 (two) times daily.   Yes [provider]  omeprazole (PRILOSEC) 20 MG capsule Take 20 mg by mouth daily.  02/26/13  Yes [provider]  vitamin B-12 (CYANOCOBALAMIN) 1000 MCG tablet Take 1,000 mcg by mouth daily.   Yes [provider]  warfarin (COUMADIN) 3 MG tablet Take 3 mg by mouth daily. Take 3 mg daily on Monday, Tuesday, Wednesday, Friday, and Saturday   Yes [provider]  warfarin (COUMADIN) 4 MG tablet Take 4 mg by mouth See admin instructions. Take 4 mg daily on Thursday and Sunday   Yes [provider]  HYDROcodone-acetaminophen (NORCO) 10-325 MG tablet Take 1 tablet by mouth every 8 (eight) hours as needed. Patient not taking: Reported on 11/09/2016 09/12/16   Wallene Huh, DPM  loperamide (IMODIUM) 2 MG capsule Take 1 capsule (2 mg total) by mouth 4 (four) times daily as needed for diarrhea or loose stools. 11/09/16   Renita Papa, PA-C    Family History Family History  Problem Relation Age of Onset  . Diabetes Mellitus II Paternal Grandmother   . Heart disease Neg Hx        He does not know his father's history.   . Cancer Neg Hx   .  Diabetes Neg Hx   . CAD Neg Hx   . Lung disease Neg Hx   . Rheumatologic disease Neg Hx     Social History Social History  Substance Use Topics  . Smoking status: Never Smoker  . Smokeless tobacco: Former Systems developer    Types: Chew  . Alcohol use No     Allergies   Amiodarone and Nifedipine   Review of Systems Review of Systems  Constitutional: Negative for chills and fever.  Respiratory: Negative for shortness of breath.   Cardiovascular: Negative for chest pain.  Gastrointestinal: Positive for abdominal pain, blood in stool, diarrhea and nausea. Negative for constipation and vomiting.  Hematological: Bruises/bleeds easily.  All other systems reviewed and are negative.  Physical Exam Updated Vital Signs BP (!) 158/54 (BP Location: Left Arm)   Pulse 65   Temp 97.9 F (36.6 C) (Oral)   Resp (!) 22   SpO2 97%   Physical Exam  Constitutional: He appears well-developed and well-nourished. No distress.  HENT:  Head: Normocephalic and atraumatic.  Eyes: Conjunctivae are normal. Right eye exhibits no discharge. Left eye exhibits no discharge.  Neck: No JVD present. No tracheal deviation present.  Cardiovascular: Normal rate and regular rhythm.   Pulmonary/Chest: Effort normal. No respiratory distress.  Globally diminished breath sounds, no wheezes or other adventitious sounds  Abdominal: He exhibits distension. There is no tenderness.  Murphy's absent, Rovsing's absent, no tenderness to palpation of McBurney's point, no CVA tenderness. Slightly tympanic to percussion  Musculoskeletal: He exhibits no edema.  Neurological: He is alert.  Skin: Skin is warm and dry. Capillary refill takes less than 2 seconds. He is not diaphoretic.  Psychiatric: He has a normal mood and affect. His behavior is normal.     ED Treatments / Results  Labs (all labs ordered are listed, but only abnormal results are displayed) Labs Reviewed  CBC WITH DIFFERENTIAL/PLATELET - Abnormal; Notable  for the following:       Result Value   WBC 11.9 (*)    Neutro Abs 8.1 (*)    Monocytes Absolute 1.2 (*)    All other components within normal limits  COMPREHENSIVE METABOLIC PANEL - Abnormal; Notable for the following:    Potassium 5.2 (*)    Glucose, Bld 163 (*)    BUN 30 (*)    Creatinine, Ser 1.98 (*)    GFR calc non Af Amer 30 (*)    GFR calc Af Amer 34 (*)    All other components within normal limits  URINALYSIS, ROUTINE W REFLEX MICROSCOPIC - Abnormal; Notable for the following:    Protein, ur 30 (*)    Bacteria, UA FEW (*)    Squamous Epithelial / LPF 0-5 (*)    All other components within normal limits  PROTIME-INR - Abnormal; Notable for the following:    Prothrombin Time 24.2 (*)    All other components within normal limits  GASTROINTESTINAL PANEL BY PCR, STOOL (REPLACES STOOL CULTURE)  LIPASE, BLOOD  POC OCCULT BLOOD, ED    EKG  EKG Interpretation None       Radiology No results found.  Procedures Procedures (including critical care time)  Medications Ordered in ED Medications  sodium chloride 0.9 % bolus 500 mL (0 mLs Intravenous Stopped 11/09/16 1814)  loperamide (IMODIUM) capsule 4 mg (4 mg Oral Given 11/09/16 1744)     Initial Impression / Assessment and Plan / ED Course  I have reviewed the triage vital signs and the nursing notes.  Pertinent labs & imaging results that were available during my care of the patient were reviewed by me and considered in my medical decision making (see chart for details).     Patient with, located medical history presents with chief complaint loose stools for 3 days. Stool is red orange in color. Hemoccult negative. Afebrile, vital signs are at patient's baseline. Slight leukocytosis of 11.9, nonspecific. CMP consistent with dehydration, no acute significant increase in creatinine. UA not concerning for UTI and the absence of symptoms. Given benign abdominal exam, imaging not indicated. Given fluids and Imodium while  in the ED with some improvement. On reevaluation, abdominal exam remains benign. Patient is ambulatory and in no apparent distress. GI panel collected, informed patient  that he will be informed if there are any abnormal results. Low suspicion of acute surgical abdomen, and no further emergent workup required. Suspect viral enteritis. Stable for discharge home with Imodium and follow-up with primary care for reevaluation in the next 48-72 hours. Discussed indications for return to the ED immediately including but not limited to development of worsening severe pain, fevers and chills, or blood in the urine and stool. Patient seen and evaluated by Dr. Tomi Bamberger.  Final Clinical Impressions(s) / ED Diagnoses   Final diagnoses:  Diarrhea in adult patient    New Prescriptions New Prescriptions   LOPERAMIDE (IMODIUM) 2 MG CAPSULE    Take 1 capsule (2 mg total) by mouth 4 (four) times daily as needed for diarrhea or loose stools.     Renita Papa, PA-C 11/09/16 [place], [place], PA-C 11/09/16 1831    Dorie Rank, MD 11/10/16 1341

## 2016-11-10 LAB — GASTROINTESTINAL PANEL BY PCR, STOOL (REPLACES STOOL CULTURE)

## 2016-11-14 DIAGNOSIS — Z961 Presence of intraocular lens: Secondary | ICD-10-CM | POA: Diagnosis not present

## 2016-11-14 DIAGNOSIS — E103553 Type 1 diabetes mellitus with stable proliferative diabetic retinopathy, bilateral: Secondary | ICD-10-CM | POA: Diagnosis not present

## 2016-12-10 ENCOUNTER — Ambulatory Visit (INDEPENDENT_AMBULATORY_CARE_PROVIDER_SITE_OTHER): Payer: BLUE CROSS/BLUE SHIELD | Admitting: Podiatry

## 2016-12-10 ENCOUNTER — Encounter: Payer: Self-pay | Admitting: Podiatry

## 2016-12-10 DIAGNOSIS — M79609 Pain in unspecified limb: Secondary | ICD-10-CM

## 2016-12-10 DIAGNOSIS — B351 Tinea unguium: Secondary | ICD-10-CM

## 2016-12-10 DIAGNOSIS — E1142 Type 2 diabetes mellitus with diabetic polyneuropathy: Secondary | ICD-10-CM

## 2016-12-10 NOTE — Progress Notes (Signed)
Complaint:  Visit Type: Patient returns to my office for continued preventative foot care services. Complaint: Patient states" my nails have grown long and thick and become painful to walk and wear shoes" Patient has been diagnosed with DM with neuropathy.. The patient presents for preventative foot care services. No changes to ROS  Podiatric Exam: Vascular: dorsalis pedis and posterior tibial pulses are palpable bilateral. Capillary return is immediate. Temperature gradient is WNL. Skin turgor WNL  Sensorium: Normal Semmes Weinstein monofilament test. Normal tactile sensation bilaterally. Nail Exam: Pt has thick disfigured discolored nails with subungual debris noted bilateral entire nail hallux through fifth toenails Ulcer Exam: There is no evidence of ulcer or pre-ulcerative changes or infection. Orthopedic Exam: Muscle tone and strength are WNL. No limitations in general ROM. No crepitus or effusions noted. Foot type and digits show no abnormalities. Bony prominences are unremarkable. Skin: No Porokeratosis. No infection or ulcers  Diagnosis:  Onychomycosis, , Pain in right toe, pain in left toes  Treatment & Plan Procedures and Treatment: Consent by patient was obtained for treatment procedures. The patient understood the discussion of treatment and procedures well. All questions were answered thoroughly reviewed. Debridement of mycotic and hypertrophic toenails, 1 through 5 bilateral and clearing of subungual debris. No ulceration, no infection noted.  Return Visit-Office Procedure: Patient instructed to return to the office for a follow up visit 3 months for continued evaluation and treatment.    Elonda Giuliano DPM 

## 2017-03-10 ENCOUNTER — Telehealth: Payer: Self-pay | Admitting: Pulmonary Disease

## 2017-03-10 NOTE — Telephone Encounter (Signed)
Left voice mail on machine for patient to return phone call back regarding needing an updated ROV with PM. Will follow up again with patient at later date.

## 2017-03-11 ENCOUNTER — Ambulatory Visit: Payer: Medicare Other | Admitting: Podiatry

## 2017-03-11 NOTE — Telephone Encounter (Signed)
lmtcb x2 for pt. 

## 2017-03-12 ENCOUNTER — Ambulatory Visit (INDEPENDENT_AMBULATORY_CARE_PROVIDER_SITE_OTHER): Payer: BLUE CROSS/BLUE SHIELD | Admitting: Podiatry

## 2017-03-12 ENCOUNTER — Encounter: Payer: Self-pay | Admitting: Podiatry

## 2017-03-12 DIAGNOSIS — E1142 Type 2 diabetes mellitus with diabetic polyneuropathy: Secondary | ICD-10-CM

## 2017-03-12 DIAGNOSIS — M79609 Pain in unspecified limb: Secondary | ICD-10-CM | POA: Diagnosis not present

## 2017-03-12 DIAGNOSIS — B351 Tinea unguium: Secondary | ICD-10-CM | POA: Diagnosis not present

## 2017-03-12 NOTE — Progress Notes (Signed)
Complaint:  Visit Type: Patient returns to my office for continued preventative foot care services. Complaint: Patient states" my nails have grown long and thick and become painful to walk and wear shoes" Patient has been diagnosed with DM with neuropathy.. The patient presents for preventative foot care services. No changes to ROS  Podiatric Exam: Vascular: dorsalis pedis and posterior tibial pulses are palpable bilateral. Capillary return is immediate. Temperature gradient is WNL. Skin turgor WNL  Sensorium: Normal Semmes Weinstein monofilament test. Normal tactile sensation bilaterally. Nail Exam: Pt has thick disfigured discolored nails with subungual debris noted bilateral entire nail hallux through fifth toenails Ulcer Exam: There is no evidence of ulcer or pre-ulcerative changes or infection. Orthopedic Exam: Muscle tone and strength are WNL. No limitations in general ROM. No crepitus or effusions noted. Foot type and digits show no abnormalities. Bony prominences are unremarkable. Skin: No Porokeratosis. No infection or ulcers  Diagnosis:  Onychomycosis, , Pain in right toe, pain in left toes  Treatment & Plan Procedures and Treatment: Consent by patient was obtained for treatment procedures. The patient understood the discussion of treatment and procedures well. All questions were answered thoroughly reviewed. Debridement of mycotic and hypertrophic toenails, 1 through 5 bilateral and clearing of subungual debris. No ulceration, no infection noted. ABN signed for 2018. Return Visit-Office Procedure: Patient instructed to return to the office for a follow up visit 3 months for continued evaluation and treatment.    Gardiner Barefoot DPM

## 2017-03-20 DIAGNOSIS — Z7901 Long term (current) use of anticoagulants: Secondary | ICD-10-CM | POA: Diagnosis not present

## 2017-03-20 DIAGNOSIS — Z23 Encounter for immunization: Secondary | ICD-10-CM | POA: Diagnosis not present

## 2017-03-25 NOTE — Telephone Encounter (Signed)
Letter has been sent to address on file. Nothing further needed.

## 2017-04-18 DIAGNOSIS — E1122 Type 2 diabetes mellitus with diabetic chronic kidney disease: Secondary | ICD-10-CM | POA: Diagnosis not present

## 2017-04-18 DIAGNOSIS — E113599 Type 2 diabetes mellitus with proliferative diabetic retinopathy without macular edema, unspecified eye: Secondary | ICD-10-CM | POA: Diagnosis not present

## 2017-04-18 DIAGNOSIS — K219 Gastro-esophageal reflux disease without esophagitis: Secondary | ICD-10-CM | POA: Diagnosis not present

## 2017-04-18 DIAGNOSIS — I129 Hypertensive chronic kidney disease with stage 1 through stage 4 chronic kidney disease, or unspecified chronic kidney disease: Secondary | ICD-10-CM | POA: Diagnosis not present

## 2017-04-18 DIAGNOSIS — E782 Mixed hyperlipidemia: Secondary | ICD-10-CM | POA: Diagnosis not present

## 2017-04-18 DIAGNOSIS — Z7901 Long term (current) use of anticoagulants: Secondary | ICD-10-CM | POA: Diagnosis not present

## 2017-04-18 DIAGNOSIS — N183 Chronic kidney disease, stage 3 (moderate): Secondary | ICD-10-CM | POA: Diagnosis not present

## 2017-04-18 DIAGNOSIS — Z0000 Encounter for general adult medical examination without abnormal findings: Secondary | ICD-10-CM | POA: Diagnosis not present

## 2017-05-15 DIAGNOSIS — Z7901 Long term (current) use of anticoagulants: Secondary | ICD-10-CM | POA: Diagnosis not present

## 2017-06-11 ENCOUNTER — Encounter: Payer: Self-pay | Admitting: Podiatry

## 2017-06-11 ENCOUNTER — Ambulatory Visit (INDEPENDENT_AMBULATORY_CARE_PROVIDER_SITE_OTHER): Payer: Medicare Other | Admitting: Podiatry

## 2017-06-11 DIAGNOSIS — M79676 Pain in unspecified toe(s): Secondary | ICD-10-CM | POA: Diagnosis not present

## 2017-06-11 DIAGNOSIS — B351 Tinea unguium: Secondary | ICD-10-CM

## 2017-06-11 DIAGNOSIS — M79609 Pain in unspecified limb: Principal | ICD-10-CM

## 2017-06-11 DIAGNOSIS — E1142 Type 2 diabetes mellitus with diabetic polyneuropathy: Secondary | ICD-10-CM

## 2017-06-11 DIAGNOSIS — D689 Coagulation defect, unspecified: Secondary | ICD-10-CM

## 2017-06-11 NOTE — Progress Notes (Signed)
Complaint:  Visit Type: Patient returns to my office for continued preventative foot care services. Complaint: Patient states" my nails have grown long and thick and become painful to walk and wear shoes" Patient has been diagnosed with DM with neuropathy.. The patient presents for preventative foot care services. No changes to ROS  Podiatric Exam: Vascular: dorsalis pedis and posterior tibial pulses are palpable bilateral. Capillary return is immediate. Temperature gradient is WNL. Skin turgor WNL  Sensorium: Normal Semmes Weinstein monofilament test. Normal tactile sensation bilaterally. Nail Exam: Pt has thick disfigured discolored nails with subungual debris noted bilateral entire nail hallux through fifth toenails Ulcer Exam: There is no evidence of ulcer or pre-ulcerative changes or infection. Orthopedic Exam: Muscle tone and strength are WNL. No limitations in general ROM. No crepitus or effusions noted. Foot type and digits show no abnormalities. Bony prominences are unremarkable. Skin: No Porokeratosis. No infection or ulcers  Diagnosis:  Onychomycosis, , Pain in right toe, pain in left toes  Treatment & Plan Procedures and Treatment: Consent by patient was obtained for treatment procedures. The patient understood the discussion of treatment and procedures well. All questions were answered thoroughly reviewed. Debridement of mycotic and hypertrophic toenails, 1 through 5 bilateral and clearing of subungual debris. No ulceration, no infection noted. ABN signed for [id number] Return Visit-Office Procedure: Patient instructed to return to the office for a follow up visit 3 months for continued evaluation and treatment.    Gardiner Barefoot DPM

## 2017-06-19 DIAGNOSIS — Z7901 Long term (current) use of anticoagulants: Secondary | ICD-10-CM | POA: Diagnosis not present

## 2017-06-19 DIAGNOSIS — I129 Hypertensive chronic kidney disease with stage 1 through stage 4 chronic kidney disease, or unspecified chronic kidney disease: Secondary | ICD-10-CM | POA: Diagnosis not present

## 2017-07-24 DIAGNOSIS — Z7901 Long term (current) use of anticoagulants: Secondary | ICD-10-CM | POA: Diagnosis not present

## 2017-08-21 DIAGNOSIS — Z7901 Long term (current) use of anticoagulants: Secondary | ICD-10-CM | POA: Diagnosis not present

## 2017-08-29 DIAGNOSIS — E118 Type 2 diabetes mellitus with unspecified complications: Secondary | ICD-10-CM | POA: Diagnosis not present

## 2017-08-29 DIAGNOSIS — E119 Type 2 diabetes mellitus without complications: Secondary | ICD-10-CM | POA: Diagnosis not present

## 2017-09-10 ENCOUNTER — Ambulatory Visit: Payer: Medicare Other | Admitting: Podiatry

## 2017-09-22 DIAGNOSIS — Z7901 Long term (current) use of anticoagulants: Secondary | ICD-10-CM | POA: Diagnosis not present

## 2017-09-26 ENCOUNTER — Ambulatory Visit: Payer: Medicare Other | Admitting: Podiatry

## 2017-10-03 ENCOUNTER — Encounter: Payer: Self-pay | Admitting: Podiatry

## 2017-10-03 ENCOUNTER — Ambulatory Visit (INDEPENDENT_AMBULATORY_CARE_PROVIDER_SITE_OTHER): Payer: Medicare Other | Admitting: Podiatry

## 2017-10-03 DIAGNOSIS — E1142 Type 2 diabetes mellitus with diabetic polyneuropathy: Secondary | ICD-10-CM

## 2017-10-03 DIAGNOSIS — B351 Tinea unguium: Secondary | ICD-10-CM | POA: Diagnosis not present

## 2017-10-03 DIAGNOSIS — M79609 Pain in unspecified limb: Secondary | ICD-10-CM | POA: Diagnosis not present

## 2017-10-03 DIAGNOSIS — D689 Coagulation defect, unspecified: Secondary | ICD-10-CM

## 2017-10-03 NOTE — Progress Notes (Signed)
Complaint:  Visit Type: Patient returns to my office for continued preventative foot care services. Complaint: Patient states" my nails have grown long and thick and become painful to walk and wear shoes" Patient has been diagnosed with DM with neuropathy.. The patient presents for preventative foot care services. No changes to ROS  Podiatric Exam: Vascular: dorsalis pedis and posterior tibial pulses are palpable bilateral. Capillary return is immediate. Temperature gradient is WNL. Skin turgor WNL  Sensorium: Normal Semmes Weinstein monofilament test. Normal tactile sensation bilaterally. Nail Exam: Pt has thick disfigured discolored nails with subungual debris noted bilateral entire nail hallux through fifth toenails Ulcer Exam: There is no evidence of ulcer or pre-ulcerative changes or infection. Orthopedic Exam: Muscle tone and strength are WNL. No limitations in general ROM. No crepitus or effusions noted. Foot type and digits show no abnormalities. Bony prominences are unremarkable. Skin: No Porokeratosis. No infection or ulcers  Diagnosis:  Onychomycosis, , Pain in right toe, pain in left toes  Treatment & Plan Procedures and Treatment: Consent by patient was obtained for treatment procedures. The patient understood the discussion of treatment and procedures well. All questions were answered thoroughly reviewed. Debridement of mycotic and hypertrophic toenails, 1 through 5 bilateral and clearing of subungual debris. No ulceration, no infection noted. ABN signed for [id number] Return Visit-Office Procedure: Patient instructed to return to the office for a follow up visit 3 months for continued evaluation and treatment.    Gardiner Barefoot DPM

## 2017-10-20 DIAGNOSIS — Z7901 Long term (current) use of anticoagulants: Secondary | ICD-10-CM | POA: Diagnosis not present

## 2017-11-03 DIAGNOSIS — Z7901 Long term (current) use of anticoagulants: Secondary | ICD-10-CM | POA: Diagnosis not present

## 2017-11-28 DIAGNOSIS — Z7901 Long term (current) use of anticoagulants: Secondary | ICD-10-CM | POA: Diagnosis not present

## 2017-12-04 DIAGNOSIS — E103553 Type 1 diabetes mellitus with stable proliferative diabetic retinopathy, bilateral: Secondary | ICD-10-CM | POA: Diagnosis not present

## 2017-12-04 DIAGNOSIS — Z961 Presence of intraocular lens: Secondary | ICD-10-CM | POA: Diagnosis not present

## 2017-12-22 ENCOUNTER — Ambulatory Visit (INDEPENDENT_AMBULATORY_CARE_PROVIDER_SITE_OTHER): Payer: Medicare Other | Admitting: Podiatry

## 2017-12-22 DIAGNOSIS — B351 Tinea unguium: Secondary | ICD-10-CM | POA: Diagnosis not present

## 2017-12-22 DIAGNOSIS — E0843 Diabetes mellitus due to underlying condition with diabetic autonomic (poly)neuropathy: Secondary | ICD-10-CM

## 2017-12-22 DIAGNOSIS — E118 Type 2 diabetes mellitus with unspecified complications: Secondary | ICD-10-CM | POA: Diagnosis not present

## 2017-12-22 DIAGNOSIS — M10072 Idiopathic gout, left ankle and foot: Secondary | ICD-10-CM

## 2017-12-22 DIAGNOSIS — M2042 Other hammer toe(s) (acquired), left foot: Secondary | ICD-10-CM

## 2017-12-22 DIAGNOSIS — M79676 Pain in unspecified toe(s): Secondary | ICD-10-CM | POA: Diagnosis not present

## 2017-12-22 DIAGNOSIS — M2041 Other hammer toe(s) (acquired), right foot: Secondary | ICD-10-CM

## 2017-12-22 DIAGNOSIS — E119 Type 2 diabetes mellitus without complications: Secondary | ICD-10-CM | POA: Diagnosis not present

## 2017-12-24 NOTE — Progress Notes (Signed)
   SUBJECTIVE Patient with a history of diabetes mellitus presents to office today complaining of elongated, thickened nails that cause pain while ambulating in shoes. He is unable to trim his own nails.  He also has a chief complaint of left ankle pain that began one week ago. He reports h/o gout in the ankle. He reports associated redness, warmth and swelling. He has not done anything for treatment. Touching the ankle or walking increases the pain. He denies any known trauma or injury. Patient is here for further evaluation and treatment.   Past Medical History:  Diagnosis Date  . Bradycardia    a. during 02/2015 admission - occasional HR in 40s while being treated for atrial fib.  . Cervical disc disease   . Chronic diastolic CHF (congestive heart failure) ([place])    a. Dx 02/2015 -  2D echo 03/02/15 showed severe focal basal hypertrophy of the septum, EF 55-60%, mild MR, no effusion.  . CKD (chronic kidney disease), stage III ([place])   . Coronary artery calcification seen on CT scan    a. Nuc 02/2015 was normal.  . Dyslipidemia   . GERD (gastroesophageal reflux disease)   . Hypertension   . 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 PAF (paroxysmal atrial fibrillation) ([place])    a. Dx 02/2015 - in and out on tele, rx'd Coumadin and amiodarone.  . Pneumonia 2007  . Shortness of breath dyspnea    WITH EXERTION  . Type II diabetes mellitus ([place])     OBJECTIVE General Patient is awake, alert, and oriented x 3 and in no acute distress. Derm Skin is dry and supple bilateral. Negative open lesions or macerations. Remaining integument unremarkable. Nails are tender, long, thickened and dystrophic with subungual debris, consistent with onychomycosis, 1-5 bilateral. No signs of infection noted. Vasc  DP and PT pedal pulses palpable bilaterally. Temperature gradient within normal limits.  Neuro Epicritic and protective threshold sensation diminished  bilaterally.  Musculoskeletal Exam Hammertoe contracture deformity noted to digits 2-5 of the bilateral feet. Pain on palpation to the anterior, medial and lateral aspects of the left ankle joint with erythema and edema.   ASSESSMENT 1. Diabetes Mellitus w/ peripheral neuropathy 2. Onychomycosis of nail due to dermatophyte bilateral 3. Hammertoes digits 2-5 bilateral  4. Ankle synovitis/acute gout left ankle   PLAN OF CARE 1. Patient evaluated today. 2. Instructed to maintain good pedal hygiene and foot care. Stressed importance of controlling blood sugar.  3. Mechanical debridement of nails 1-5 bilaterally performed using a nail nipper. Filed with dremel without incident.  4. Injection of 0.5 mLs Celestone Soluspan injected into the patient's left ankle joint.  5. Appointment with Liliane Channel for DM shoes and insoles.  6. Return to clinic in 3 mos.     Edrick Kins, DPM [facility]  Dr. Edrick Kins, [address]                                        [place], [place] [address]                Office [phone]  Fax [phone]

## 2017-12-25 DIAGNOSIS — Z7901 Long term (current) use of anticoagulants: Secondary | ICD-10-CM | POA: Diagnosis not present

## 2017-12-30 ENCOUNTER — Other Ambulatory Visit: Payer: Medicare Other | Admitting: Orthotics

## 2018-01-07 ENCOUNTER — Ambulatory Visit: Payer: Medicare Other | Admitting: Podiatry

## 2018-01-13 ENCOUNTER — Ambulatory Visit: Payer: Medicare Other | Admitting: Podiatry

## 2018-01-21 DIAGNOSIS — Z7901 Long term (current) use of anticoagulants: Secondary | ICD-10-CM | POA: Diagnosis not present

## 2018-02-04 DIAGNOSIS — Z23 Encounter for immunization: Secondary | ICD-10-CM | POA: Diagnosis not present

## 2018-02-04 DIAGNOSIS — Z7901 Long term (current) use of anticoagulants: Secondary | ICD-10-CM | POA: Diagnosis not present

## 2018-02-17 DIAGNOSIS — Z7901 Long term (current) use of anticoagulants: Secondary | ICD-10-CM | POA: Diagnosis not present

## 2018-03-16 DIAGNOSIS — Z7901 Long term (current) use of anticoagulants: Secondary | ICD-10-CM | POA: Diagnosis not present

## 2018-03-17 DIAGNOSIS — Z7901 Long term (current) use of anticoagulants: Secondary | ICD-10-CM | POA: Diagnosis not present

## 2018-03-17 DIAGNOSIS — E1139 Type 2 diabetes mellitus with other diabetic ophthalmic complication: Secondary | ICD-10-CM | POA: Diagnosis not present

## 2018-03-24 ENCOUNTER — Ambulatory Visit (INDEPENDENT_AMBULATORY_CARE_PROVIDER_SITE_OTHER): Payer: Medicare Other | Admitting: Podiatry

## 2018-03-24 ENCOUNTER — Encounter: Payer: Self-pay | Admitting: Podiatry

## 2018-03-24 DIAGNOSIS — E119 Type 2 diabetes mellitus without complications: Secondary | ICD-10-CM | POA: Diagnosis not present

## 2018-03-24 DIAGNOSIS — B351 Tinea unguium: Secondary | ICD-10-CM

## 2018-03-24 DIAGNOSIS — E118 Type 2 diabetes mellitus with unspecified complications: Secondary | ICD-10-CM | POA: Diagnosis not present

## 2018-03-24 DIAGNOSIS — M79676 Pain in unspecified toe(s): Secondary | ICD-10-CM

## 2018-03-24 DIAGNOSIS — Z7901 Long term (current) use of anticoagulants: Secondary | ICD-10-CM | POA: Diagnosis not present

## 2018-03-24 DIAGNOSIS — E0843 Diabetes mellitus due to underlying condition with diabetic autonomic (poly)neuropathy: Secondary | ICD-10-CM

## 2018-03-24 NOTE — Progress Notes (Signed)
Complaint:  Visit Type: Patient returns to my office for continued preventative foot care services. Complaint: Patient states" my nails have grown long and thick and become painful to walk and wear shoes" Patient has been diagnosed with DM with neuropathy.. The patient presents for preventative foot care services. No changes to ROS  Podiatric Exam: Vascular: dorsalis pedis and posterior tibial pulses are palpable bilateral. Capillary return is immediate. Temperature gradient is WNL. Skin turgor WNL  Sensorium: Normal Semmes Weinstein monofilament test. Normal tactile sensation bilaterally. Nail Exam: Pt has thick disfigured discolored nails with subungual debris noted bilateral entire nail hallux through fifth toenails Ulcer Exam: There is no evidence of ulcer or pre-ulcerative changes or infection. Orthopedic Exam: Muscle tone and strength are WNL. No limitations in general ROM. No crepitus or effusions noted. Foot type and digits show no abnormalities. Bony prominences are unremarkable. Skin: No Porokeratosis. No infection or ulcers  Diagnosis:  Onychomycosis, , Pain in right toe, pain in left toes  Treatment & Plan Procedures and Treatment: Consent by patient was obtained for treatment procedures. The patient understood the discussion of treatment and procedures well. All questions were answered thoroughly reviewed. Debridement of mycotic and hypertrophic toenails, 1 through 5 bilateral and clearing of subungual debris. No ulceration, no infection noted. ABN signed for 2019 Return Visit-Office Procedure: Patient instructed to return to the office for a follow up visit 3 months for continued evaluation and treatment.    Gardiner Barefoot DPM

## 2018-04-20 DIAGNOSIS — Z7901 Long term (current) use of anticoagulants: Secondary | ICD-10-CM | POA: Diagnosis not present

## 2018-04-27 DIAGNOSIS — N183 Chronic kidney disease, stage 3 (moderate): Secondary | ICD-10-CM | POA: Diagnosis not present

## 2018-04-27 DIAGNOSIS — E113593 Type 2 diabetes mellitus with proliferative diabetic retinopathy without macular edema, bilateral: Secondary | ICD-10-CM | POA: Diagnosis not present

## 2018-04-27 DIAGNOSIS — I129 Hypertensive chronic kidney disease with stage 1 through stage 4 chronic kidney disease, or unspecified chronic kidney disease: Secondary | ICD-10-CM | POA: Diagnosis not present

## 2018-04-27 DIAGNOSIS — E1122 Type 2 diabetes mellitus with diabetic chronic kidney disease: Secondary | ICD-10-CM | POA: Diagnosis not present

## 2018-05-11 DIAGNOSIS — Z7901 Long term (current) use of anticoagulants: Secondary | ICD-10-CM | POA: Diagnosis not present

## 2018-05-13 ENCOUNTER — Other Ambulatory Visit: Payer: Self-pay

## 2018-05-13 ENCOUNTER — Inpatient Hospital Stay (HOSPITAL_COMMUNITY): Payer: Medicare Other

## 2018-05-13 ENCOUNTER — Emergency Department (HOSPITAL_COMMUNITY): Payer: Medicare Other

## 2018-05-13 ENCOUNTER — Inpatient Hospital Stay (HOSPITAL_COMMUNITY)
Admission: EM | Admit: 2018-05-13 | Discharge: 2018-05-27 | DRG: 981 | Disposition: A | Discharge status: Skilled Nursing Facility | Payer: Medicare Other | Attending: Internal Medicine | Admitting: Internal Medicine

## 2018-05-13 DIAGNOSIS — D72829 Elevated white blood cell count, unspecified: Secondary | ICD-10-CM | POA: Diagnosis not present

## 2018-05-13 DIAGNOSIS — Z981 Arthrodesis status: Secondary | ICD-10-CM

## 2018-05-13 DIAGNOSIS — E1129 Type 2 diabetes mellitus with other diabetic kidney complication: Secondary | ICD-10-CM | POA: Diagnosis present

## 2018-05-13 DIAGNOSIS — E785 Hyperlipidemia, unspecified: Secondary | ICD-10-CM | POA: Diagnosis not present

## 2018-05-13 DIAGNOSIS — E875 Hyperkalemia: Secondary | ICD-10-CM | POA: Diagnosis present

## 2018-05-13 DIAGNOSIS — S42252A Displaced fracture of greater tuberosity of left humerus, initial encounter for closed fracture: Secondary | ICD-10-CM | POA: Diagnosis present

## 2018-05-13 DIAGNOSIS — R0902 Hypoxemia: Secondary | ICD-10-CM | POA: Diagnosis not present

## 2018-05-13 DIAGNOSIS — I2510 Atherosclerotic heart disease of native coronary artery without angina pectoris: Secondary | ICD-10-CM | POA: Diagnosis present

## 2018-05-13 DIAGNOSIS — K5900 Constipation, unspecified: Secondary | ICD-10-CM | POA: Diagnosis not present

## 2018-05-13 DIAGNOSIS — Y92009 Unspecified place in unspecified non-institutional (private) residence as the place of occurrence of the external cause: Secondary | ICD-10-CM | POA: Diagnosis not present

## 2018-05-13 DIAGNOSIS — Y92230 Patient room in hospital as the place of occurrence of the external cause: Secondary | ICD-10-CM | POA: Diagnosis not present

## 2018-05-13 DIAGNOSIS — Z23 Encounter for immunization: Secondary | ICD-10-CM

## 2018-05-13 DIAGNOSIS — E669 Obesity, unspecified: Secondary | ICD-10-CM | POA: Diagnosis present

## 2018-05-13 DIAGNOSIS — I5033 Acute on chronic diastolic (congestive) heart failure: Secondary | ICD-10-CM | POA: Diagnosis not present

## 2018-05-13 DIAGNOSIS — E8770 Fluid overload, unspecified: Secondary | ICD-10-CM

## 2018-05-13 DIAGNOSIS — S01112A Laceration without foreign body of left eyelid and periocular area, initial encounter: Secondary | ICD-10-CM | POA: Diagnosis not present

## 2018-05-13 DIAGNOSIS — Z9049 Acquired absence of other specified parts of digestive tract: Secondary | ICD-10-CM

## 2018-05-13 DIAGNOSIS — F05 Delirium due to known physiological condition: Secondary | ICD-10-CM | POA: Diagnosis not present

## 2018-05-13 DIAGNOSIS — K219 Gastro-esophageal reflux disease without esophagitis: Secondary | ICD-10-CM | POA: Diagnosis not present

## 2018-05-13 DIAGNOSIS — R4020 Unspecified coma: Secondary | ICD-10-CM | POA: Diagnosis not present

## 2018-05-13 DIAGNOSIS — Z7901 Long term (current) use of anticoagulants: Secondary | ICD-10-CM

## 2018-05-13 DIAGNOSIS — I10 Essential (primary) hypertension: Secondary | ICD-10-CM

## 2018-05-13 DIAGNOSIS — R52 Pain, unspecified: Secondary | ICD-10-CM

## 2018-05-13 DIAGNOSIS — J849 Interstitial pulmonary disease, unspecified: Secondary | ICD-10-CM | POA: Diagnosis not present

## 2018-05-13 DIAGNOSIS — S42212A Unspecified displaced fracture of surgical neck of left humerus, initial encounter for closed fracture: Secondary | ICD-10-CM | POA: Diagnosis present

## 2018-05-13 DIAGNOSIS — W19XXXA Unspecified fall, initial encounter: Secondary | ICD-10-CM | POA: Diagnosis not present

## 2018-05-13 DIAGNOSIS — Z794 Long term (current) use of insulin: Secondary | ICD-10-CM | POA: Diagnosis not present

## 2018-05-13 DIAGNOSIS — W01190A Fall on same level from slipping, tripping and stumbling with subsequent striking against furniture, initial encounter: Secondary | ICD-10-CM | POA: Diagnosis present

## 2018-05-13 DIAGNOSIS — S065XAA Traumatic subdural hemorrhage with loss of consciousness status unknown, initial encounter: Secondary | ICD-10-CM | POA: Diagnosis present

## 2018-05-13 DIAGNOSIS — S065X9A Traumatic subdural hemorrhage with loss of consciousness of unspecified duration, initial encounter: Secondary | ICD-10-CM | POA: Diagnosis not present

## 2018-05-13 DIAGNOSIS — S4292XA Fracture of left shoulder girdle, part unspecified, initial encounter for closed fracture: Secondary | ICD-10-CM | POA: Diagnosis not present

## 2018-05-13 DIAGNOSIS — R402362 Coma scale, best motor response, obeys commands, at arrival to emergency department: Secondary | ICD-10-CM | POA: Diagnosis present

## 2018-05-13 DIAGNOSIS — Z96612 Presence of left artificial shoulder joint: Secondary | ICD-10-CM | POA: Diagnosis not present

## 2018-05-13 DIAGNOSIS — M2550 Pain in unspecified joint: Secondary | ICD-10-CM | POA: Diagnosis not present

## 2018-05-13 DIAGNOSIS — E8779 Other fluid overload: Secondary | ICD-10-CM | POA: Diagnosis not present

## 2018-05-13 DIAGNOSIS — Z9842 Cataract extraction status, left eye: Secondary | ICD-10-CM

## 2018-05-13 DIAGNOSIS — S065X0A Traumatic subdural hemorrhage without loss of consciousness, initial encounter: Secondary | ICD-10-CM | POA: Diagnosis not present

## 2018-05-13 DIAGNOSIS — R402142 Coma scale, eyes open, spontaneous, at arrival to emergency department: Secondary | ICD-10-CM | POA: Diagnosis present

## 2018-05-13 DIAGNOSIS — R58 Hemorrhage, not elsewhere classified: Secondary | ICD-10-CM | POA: Diagnosis not present

## 2018-05-13 DIAGNOSIS — Z833 Family history of diabetes mellitus: Secondary | ICD-10-CM

## 2018-05-13 DIAGNOSIS — Z6824 Body mass index (BMI) 24.0-24.9, adult: Secondary | ICD-10-CM

## 2018-05-13 DIAGNOSIS — N183 Chronic kidney disease, stage 3 unspecified: Secondary | ICD-10-CM | POA: Diagnosis present

## 2018-05-13 DIAGNOSIS — N179 Acute kidney failure, unspecified: Secondary | ICD-10-CM | POA: Diagnosis not present

## 2018-05-13 DIAGNOSIS — R402252 Coma scale, best verbal response, oriented, at arrival to emergency department: Secondary | ICD-10-CM | POA: Diagnosis present

## 2018-05-13 DIAGNOSIS — I130 Hypertensive heart and chronic kidney disease with heart failure and stage 1 through stage 4 chronic kidney disease, or unspecified chronic kidney disease: Secondary | ICD-10-CM | POA: Diagnosis present

## 2018-05-13 DIAGNOSIS — Z471 Aftercare following joint replacement surgery: Secondary | ICD-10-CM | POA: Diagnosis not present

## 2018-05-13 DIAGNOSIS — Z888 Allergy status to other drugs, medicaments and biological substances status: Secondary | ICD-10-CM

## 2018-05-13 DIAGNOSIS — M10371 Gout due to renal impairment, right ankle and foot: Secondary | ICD-10-CM | POA: Diagnosis not present

## 2018-05-13 DIAGNOSIS — R279 Unspecified lack of coordination: Secondary | ICD-10-CM | POA: Diagnosis not present

## 2018-05-13 DIAGNOSIS — J9601 Acute respiratory failure with hypoxia: Secondary | ICD-10-CM | POA: Diagnosis not present

## 2018-05-13 DIAGNOSIS — S42292A Other displaced fracture of upper end of left humerus, initial encounter for closed fracture: Secondary | ICD-10-CM | POA: Diagnosis not present

## 2018-05-13 DIAGNOSIS — S42352A Displaced comminuted fracture of shaft of humerus, left arm, initial encounter for closed fracture: Secondary | ICD-10-CM | POA: Diagnosis present

## 2018-05-13 DIAGNOSIS — I6200 Nontraumatic subdural hemorrhage, unspecified: Secondary | ICD-10-CM | POA: Diagnosis not present

## 2018-05-13 DIAGNOSIS — M79603 Pain in arm, unspecified: Secondary | ICD-10-CM | POA: Diagnosis not present

## 2018-05-13 DIAGNOSIS — M6281 Muscle weakness (generalized): Secondary | ICD-10-CM | POA: Diagnosis not present

## 2018-05-13 DIAGNOSIS — I5032 Chronic diastolic (congestive) heart failure: Secondary | ICD-10-CM

## 2018-05-13 DIAGNOSIS — Z9181 History of falling: Secondary | ICD-10-CM | POA: Diagnosis not present

## 2018-05-13 DIAGNOSIS — S42302A Unspecified fracture of shaft of humerus, left arm, initial encounter for closed fracture: Secondary | ICD-10-CM | POA: Diagnosis not present

## 2018-05-13 DIAGNOSIS — M5090 Cervical disc disorder, unspecified, unspecified cervical region: Secondary | ICD-10-CM | POA: Diagnosis not present

## 2018-05-13 DIAGNOSIS — R791 Abnormal coagulation profile: Secondary | ICD-10-CM | POA: Diagnosis present

## 2018-05-13 DIAGNOSIS — G8918 Other acute postprocedural pain: Secondary | ICD-10-CM | POA: Diagnosis not present

## 2018-05-13 DIAGNOSIS — Z8701 Personal history of pneumonia (recurrent): Secondary | ICD-10-CM

## 2018-05-13 DIAGNOSIS — E1122 Type 2 diabetes mellitus with diabetic chronic kidney disease: Secondary | ICD-10-CM | POA: Diagnosis not present

## 2018-05-13 DIAGNOSIS — R41841 Cognitive communication deficit: Secondary | ICD-10-CM | POA: Diagnosis not present

## 2018-05-13 DIAGNOSIS — S42201A Unspecified fracture of upper end of right humerus, initial encounter for closed fracture: Secondary | ICD-10-CM | POA: Diagnosis not present

## 2018-05-13 DIAGNOSIS — R55 Syncope and collapse: Secondary | ICD-10-CM | POA: Diagnosis not present

## 2018-05-13 DIAGNOSIS — Z9841 Cataract extraction status, right eye: Secondary | ICD-10-CM

## 2018-05-13 DIAGNOSIS — Y92000 Kitchen of unspecified non-institutional (private) residence as  the place of occurrence of the external cause: Secondary | ICD-10-CM

## 2018-05-13 DIAGNOSIS — I480 Paroxysmal atrial fibrillation: Secondary | ICD-10-CM | POA: Diagnosis present

## 2018-05-13 DIAGNOSIS — Z79899 Other long term (current) drug therapy: Secondary | ICD-10-CM

## 2018-05-13 DIAGNOSIS — S065X0D Traumatic subdural hemorrhage without loss of consciousness, subsequent encounter: Secondary | ICD-10-CM | POA: Diagnosis not present

## 2018-05-13 DIAGNOSIS — R531 Weakness: Secondary | ICD-10-CM | POA: Diagnosis not present

## 2018-05-13 DIAGNOSIS — Z7401 Bed confinement status: Secondary | ICD-10-CM | POA: Diagnosis not present

## 2018-05-13 DIAGNOSIS — Z961 Presence of intraocular lens: Secondary | ICD-10-CM | POA: Diagnosis present

## 2018-05-13 LAB — COMPREHENSIVE METABOLIC PANEL
ALT: 33 U/L (ref 0–44)
AST: 32 U/L (ref 15–41)
Albumin: 3.4 g/dL — ABNORMAL LOW (ref 3.5–5.0)
Alkaline Phosphatase: 98 U/L (ref 38–126)
Anion gap: 10 (ref 5–15)
BUN: 31 mg/dL — AB (ref 8–23)
CALCIUM: 9.6 mg/dL (ref 8.9–10.3)
CHLORIDE: 98 mmol/L (ref 98–111)
CO2: 32 mmol/L (ref 22–32)
Creatinine, Ser: 1.66 mg/dL — ABNORMAL HIGH (ref 0.61–1.24)
GFR, EST AFRICAN AMERICAN: 43 mL/min — AB (ref >60)
GFR, EST NON AFRICAN AMERICAN: 37 mL/min — AB (ref >60)
Glucose, Bld: 186 mg/dL — ABNORMAL HIGH (ref 70–99)
Potassium: 5.5 mmol/L — ABNORMAL HIGH (ref 3.5–5.1)
Sodium: 140 mmol/L (ref 135–145)
Total Bilirubin: 0.9 mg/dL (ref 0.3–1.2)
Total Protein: 6.1 g/dL — ABNORMAL LOW (ref 6.5–8.1)

## 2018-05-13 LAB — CBC WITH DIFFERENTIAL/PLATELET
ABS IMMATURE GRANULOCYTES: 0.1 10*3/uL — AB (ref 0.00–0.07)
BASOS PCT: 1 %
Basophils Absolute: 0.1 10*3/uL (ref 0.0–0.1)
Eosinophils Absolute: 0.2 10*3/uL (ref 0.0–0.5)
Eosinophils Relative: 1 %
HEMATOCRIT: 46.5 % (ref 39.0–52.0)
Hemoglobin: 14.6 g/dL (ref 13.0–17.0)
IMMATURE GRANULOCYTES: 1 %
LYMPHS ABS: 1.1 10*3/uL (ref 0.7–4.0)
Lymphocytes Relative: 6 %
MCH: 30.2 pg (ref 26.0–34.0)
MCHC: 31.4 g/dL (ref 30.0–36.0)
MCV: 96.1 fL (ref 80.0–100.0)
MONO ABS: 1.7 10*3/uL — AB (ref 0.1–1.0)
MONOS PCT: 9 %
NEUTROS ABS: 15.8 10*3/uL — AB (ref 1.7–7.7)
NEUTROS PCT: 82 %
Platelets: 243 10*3/uL (ref 150–400)
RBC: 4.84 MIL/uL (ref 4.22–5.81)
RDW: 14.7 % (ref 11.5–15.5)
WBC: 19 10*3/uL — ABNORMAL HIGH (ref 4.0–10.5)
nRBC: 0 % (ref 0.0–0.2)

## 2018-05-13 LAB — CBG MONITORING, ED: GLUCOSE-CAPILLARY: 174 mg/dL — AB (ref 70–99)

## 2018-05-13 LAB — PROTIME-INR
INR: 3.17
INR: 1.16
PROTHROMBIN TIME: 14.7 s (ref 11.4–15.2)
Prothrombin Time: 32 seconds — ABNORMAL HIGH (ref 11.4–15.2)

## 2018-05-13 LAB — CK: Total CK: 215 U/L (ref 49–397)

## 2018-05-13 LAB — APTT: aPTT: 27 seconds (ref 24–36)

## 2018-05-13 LAB — BRAIN NATRIURETIC PEPTIDE: B Natriuretic Peptide: 129.8 pg/mL — ABNORMAL HIGH (ref 0.0–100.0)

## 2018-05-13 LAB — TROPONIN I

## 2018-05-13 MED ORDER — HYDRALAZINE HCL 20 MG/ML IJ SOLN: 5.0000 mg | INTRAMUSCULAR | Status: DC | PRN

## 2018-05-13 MED ORDER — METHOCARBAMOL 500 MG PO TABS
500.0000 mg | ORAL_TABLET | Freq: Three times a day (TID) | ORAL | Status: DC | PRN
  Administered 2018-05-14 – 2018-05-26 (×16): 500 mg via ORAL
  Filled 2018-05-13 (×19): qty 1

## 2018-05-13 MED ORDER — LIDOCAINE-EPINEPHRINE (PF) 2 %-1:200000 IJ SOLN
10.0000 mL | Freq: Once | INTRAMUSCULAR | Status: AC
  Administered 2018-05-13: 10 mL via INTRADERMAL
  Filled 2018-05-13: qty 20

## 2018-05-13 MED ORDER — MORPHINE SULFATE (PF) 2 MG/ML IV SOLN
0.5000 mg | INTRAVENOUS | Status: DC | PRN
  Administered 2018-05-14 – 2018-05-20 (×8): 0.5 mg via INTRAVENOUS
  Filled 2018-05-13 (×9): qty 1

## 2018-05-13 MED ORDER — ZOLPIDEM TARTRATE 5 MG PO TABS
5.0000 mg | ORAL_TABLET | Freq: Every evening | ORAL | Status: DC | PRN
  Administered 2018-05-16 – 2018-05-26 (×7): 5 mg via ORAL
  Filled 2018-05-13 (×7): qty 1

## 2018-05-13 MED ORDER — POLYETHYLENE GLYCOL 3350 17 G PO PACK
17.0000 g | PACK | Freq: Every day | ORAL | Status: DC | PRN
  Administered 2018-05-20: 17 g via ORAL
  Filled 2018-05-13: qty 1

## 2018-05-13 MED ORDER — ACETAMINOPHEN 325 MG PO TABS
650.0000 mg | ORAL_TABLET | Freq: Four times a day (QID) | ORAL | Status: DC | PRN
  Administered 2018-05-16 – 2018-05-20 (×3): 650 mg via ORAL
  Filled 2018-05-13 (×3): qty 2

## 2018-05-13 MED ORDER — DEXTROSE 50 % IV SOLN
50.0000 mL | Freq: Once | INTRAVENOUS | Status: DC
  Filled 2018-05-13: qty 50

## 2018-05-13 MED ORDER — ALBUTEROL SULFATE (2.5 MG/3ML) 0.083% IN NEBU
2.5000 mg | INHALATION_SOLUTION | RESPIRATORY_TRACT | Status: DC | PRN
  Administered 2018-05-19: 2.5 mg via RESPIRATORY_TRACT
  Filled 2018-05-13: qty 3

## 2018-05-13 MED ORDER — OXYCODONE-ACETAMINOPHEN 5-325 MG PO TABS
1.0000 | ORAL_TABLET | ORAL | Status: DC | PRN
  Administered 2018-05-14 – 2018-05-20 (×22): 1 via ORAL
  Filled 2018-05-13 (×24): qty 1

## 2018-05-13 MED ORDER — PROTHROMBIN COMPLEX CONC HUMAN 500 UNITS IV KIT
1602.0000 [IU] | PACK | Freq: Once | Status: AC
  Administered 2018-05-13: 1602 [IU] via INTRAVENOUS
  Filled 2018-05-13: qty 602

## 2018-05-13 MED ORDER — INSULIN ASPART 100 UNIT/ML ~~LOC~~ SOLN
0.0000 [IU] | Freq: Three times a day (TID) | SUBCUTANEOUS | Status: DC
  Administered 2018-05-14: 5 [IU] via SUBCUTANEOUS
  Administered 2018-05-14 – 2018-05-15 (×3): 3 [IU] via SUBCUTANEOUS
  Administered 2018-05-15: 1 [IU] via SUBCUTANEOUS
  Administered 2018-05-15: 3 [IU] via SUBCUTANEOUS
  Administered 2018-05-16: 5 [IU] via SUBCUTANEOUS
  Administered 2018-05-18: 1 [IU] via SUBCUTANEOUS
  Administered 2018-05-20: 2 [IU] via SUBCUTANEOUS
  Administered 2018-05-20: 3 [IU] via SUBCUTANEOUS
  Administered 2018-05-20 – 2018-05-21 (×2): 2 [IU] via SUBCUTANEOUS
  Administered 2018-05-21 – 2018-05-24 (×5): 1 [IU] via SUBCUTANEOUS
  Administered 2018-05-25 (×2): 2 [IU] via SUBCUTANEOUS
  Administered 2018-05-25: 1 [IU] via SUBCUTANEOUS
  Administered 2018-05-26 – 2018-05-27 (×3): 2 [IU] via SUBCUTANEOUS

## 2018-05-13 MED ORDER — MORPHINE SULFATE (PF) 4 MG/ML IV SOLN
6.0000 mg | Freq: Once | INTRAVENOUS | Status: AC
  Administered 2018-05-13: 6 mg via INTRAVENOUS
  Filled 2018-05-13: qty 2

## 2018-05-13 MED ORDER — TETANUS-DIPHTH-ACELL PERTUSSIS 5-2.5-18.5 LF-MCG/0.5 IM SUSP
0.5000 mL | Freq: Once | INTRAMUSCULAR | Status: AC
  Administered 2018-05-13: 0.5 mL via INTRAMUSCULAR
  Filled 2018-05-13: qty 0.5

## 2018-05-13 MED ORDER — SODIUM POLYSTYRENE SULFONATE 15 GM/60ML PO SUSP
30.0000 g | Freq: Once | ORAL | Status: AC
  Administered 2018-05-14: 30 g via ORAL
  Filled 2018-05-13: qty 120

## 2018-05-13 MED ORDER — ONDANSETRON HCL 4 MG/2ML IJ SOLN
4.0000 mg | Freq: Once | INTRAMUSCULAR | Status: AC
  Administered 2018-05-13: 4 mg via INTRAVENOUS
  Filled 2018-05-13: qty 2

## 2018-05-13 MED ORDER — INSULIN ASPART 100 UNIT/ML IV SOLN
5.0000 [IU] | Freq: Once | INTRAVENOUS | Status: AC
  Administered 2018-05-14: 2 [IU] via INTRAVENOUS

## 2018-05-13 MED ORDER — ONDANSETRON HCL 4 MG/2ML IJ SOLN: 4.0000 mg | Freq: Three times a day (TID) | INTRAMUSCULAR | Status: DC | PRN

## 2018-05-13 MED ORDER — VITAMIN K1 10 MG/ML IJ SOLN
10.0000 mg | Freq: Once | INTRAVENOUS | Status: AC
  Administered 2018-05-13: 10 mg via INTRAVENOUS
  Filled 2018-05-13: qty 1

## 2018-05-13 NOTE — H&P (Signed)
History and Physical    [name]:[id number] DOB: 02-02-1932 DOA: 05/13/2018  Referring MD/NP/PA:   PCP: Lavone Orn, MD   Patient coming from:  The patient is coming from home.  At baseline, pt is independent for most of ADL.        Chief Complaint: fall, left shoulder pain and headache.  HPI: Justin Leach is a 84 y.o. male with medical history significant of hypertension, hyperlipidemia, diabetes mellitus, GERD, dCHF, CKD 3, CAD, PAF on Coumadin, ILD, who presents with a fall, left shoulder pain.  Pt states that he fell when he was waling from living room to kitchen, tripped his steps at about 5 PM, stiking his head against the side of a table. He is not sure if he lost consciousness, but saying that it is possible that he may have lost consciousness shortly. Patient report he lives at home by himself.  He usually walks with a cane.  This morning, he was not using his cane when he walked into kitchen. He has skin laceration in left eyebrow with bleeding controlled. He developed severe pain over left shoulder and also has some headache.  Shoulder pain is constant, sharp, severe, nonradiating.  No arm numbness.  Patient denies any unilateral tingling in extremities.  No facial droop or slurred speech.  Denies any chest pain, shortness breath, cough.  No fever or chills.  No nausea vomiting, diarrhea, abdominal pain, symptoms of UTI. He is currently on Coumadin for A fib. Per his nephew, patient was normal at 2:30 when he talked to patient on the phone.  Patient was on the ground for about 1 to 1.5 hours.  ED Course: pt was found to have INR 3.17, WBC 19.0, negative troponin, potassium 5.5, pending EKG, stable renal function, temperature 97.4, no tachycardia, no tachypnea, oxygen saturation 98% on room air.  Chest x-ray showed left basilar scarring without infiltration.  X-ray of left shoulder showed comminuted left humeral surgical neck fracture.  CT head showed bilateral subdural  hematoma without midline shift.  Patient will be given vitamin K 10 mg and Kcentra in the ED.  Orthopedic surgeon, Dr. Lyla Glassing and neurosurgeon, Dr. Elyn Peers were consulted by ED physician.  Review of Systems:   General: no fevers, chills, no body weight gain, has fatigue HEENT: no blurry vision, hearing changes or sore throat Respiratory: no dyspnea, coughing, wheezing CV: no chest pain, no palpitations GI: no nausea, vomiting, abdominal pain, diarrhea, constipation GU: no dysuria, burning on urination, increased urinary frequency, hematuria  Ext: has leg edema Neuro: no unilateral weakness, numbness, or tingling, no vision change or hearing loss. Has fall and headache. Skin: no rash, has skin tear in left eyebrow MSK: has left shoulder pain Heme: No easy bruising.  Travel history: No recent long distant travel.  Allergy:  Allergies  Allergen Reactions  . 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 Patient subsequently taken off amiodarone.  . Nifedipine Other (See Comments)    Reaction: made gums swell up. Pt states that he had to have gum surgery after taking.     Past Medical History:  Diagnosis Date  . Bradycardia    a. during 02/2015 admission - occasional HR in 40s while being treated for atrial fib.  . Cervical disc disease   . Chronic diastolic CHF (congestive heart failure) ([place])    a. Dx 02/2015 -  2D echo 03/02/15 showed  severe focal basal hypertrophy of the septum, EF 55-60%, mild MR, no effusion.  . CKD (chronic kidney disease), stage III ([place])   . Coronary artery calcification seen on CT scan    a. Nuc 02/2015 was normal.  . Dyslipidemia   . GERD (gastroesophageal reflux disease)   . Hypertension   . 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 PAF (paroxysmal atrial fibrillation) ([place])    a. Dx 02/2015 - in and out on  tele, rx'd Coumadin and amiodarone.  . Pneumonia 2007  . Shortness of breath dyspnea    WITH EXERTION  . Type II diabetes mellitus ([place])     Past Surgical History:  Procedure Laterality Date  . CATARACT EXTRACTION W/ INTRAOCULAR LENS  IMPLANT, BILATERAL Bilateral   . CERVICAL DISC SURGERY     "i've had 3 neck ORs; not sure what kind; all thru the back of my neck"  . CHOLECYSTECTOMY    . COLONOSCOPY WITH PROPOFOL N/A 01/02/2016   Procedure: COLONOSCOPY WITH PROPOFOL;  Surgeon: [name], MD;  Location: [facility];  Service: Endoscopy;  Laterality: N/A;  . EYE SURGERY  1930's   "don't know what for" (05/11/2013)  . JOINT REPLACEMENT     bilateral knees, elbows, and shoulders (on 05/11/2013 pt denies all joint replacements"   . KNEE ARTHROPLASTY     "had one scope; one open knee OR; not sure which on which side" (05/10/2013)  . KNEE ARTHROSCOPY     "had one scope; one open knee OR; not sure which on which side" (05/10/2013)  . POSTERIOR FUSION CERVICAL SPINE    . ROTATOR CUFF REPAIR Bilateral   . VIDEO BRONCHOSCOPY Bilateral 02/01/2016   Procedure: VIDEO BRONCHOSCOPY WITHOUT FLUORO;  Surgeon: Marshell [name], MD;  Location: [facility];  Service: Cardiopulmonary;  Laterality: Bilateral;    Social History:  reports that he has never smoked. He has quit using smokeless tobacco.  His smokeless tobacco use included chew. He reports that he does not drink alcohol or use drugs.  Family History:  Family History  Problem Relation Age of Onset  . Diabetes Mellitus II Paternal Grandmother   . Heart disease Neg Hx        He does not know his father's history.   . Cancer Neg Hx   . Diabetes Neg Hx   . CAD Neg Hx   . Lung disease Neg Hx   . Rheumatologic disease Neg Hx      Prior to Admission medications   Medication Sig Start Date End Date Taking? Authorizing Provider  atorvastatin (LIPITOR) 80 MG tablet Take 40 mg by mouth every evening.  12/27/13   [provider]    Cholecalciferol (VITAMIN D-3) 1000 UNITS CAPS Take 1 capsule by mouth daily.    [provider]  HYDROcodone-acetaminophen (NORCO) 10-325 MG tablet Take 1 tablet by mouth every 8 (eight) hours as needed. 09/12/16   Wallene Huh, DPM  insulin aspart (NOVOLOG) 100 UNIT/ML injection Inject 0-5 Units into the skin 3 (three) times daily before meals. Sliding scale.    [provider]  insulin detemir (LEVEMIR) 100 UNIT/ML injection Inject 40-60 Units into the skin at bedtime. 60 units in the morning and 40 units in the evening. ( depending on how pt feels at the time of dose)    [provider]  lisinopril (PRINIVIL,ZESTRIL) 10 MG tablet Take 1 tablet (10 mg total) by mouth daily. 07/17/15  Mannam, Praveen, MD  loperamide (IMODIUM) 2 MG capsule Take 1 capsule (2 mg total) by mouth 4 (four) times daily as needed for diarrhea or loose stools. 11/09/16   Nils Flack, Mina A, PA-C  metoprolol (LOPRESSOR) 50 MG tablet Take 50 mg by mouth 2 (two) times daily.    [provider]  omeprazole (PRILOSEC) 20 MG capsule Take 20 mg by mouth daily.  02/26/13   [provider]  vitamin B-12 (CYANOCOBALAMIN) 1000 MCG tablet Take 1,000 mcg by mouth daily.    [provider]  warfarin (COUMADIN) 3 MG tablet Take 3 mg by mouth daily. Take 3 mg daily on Monday, Tuesday, Wednesday, Friday, and Saturday    [provider]  warfarin (COUMADIN) 4 MG tablet Take 4 mg by mouth See admin instructions. Take 4 mg daily on Thursday and Sunday    [provider]    Physical Exam: Vitals:   05/13/18 2130 05/13/18 2145 05/13/18 2200 05/13/18 2230  BP: (!) 146/51 (!) 161/43 103/71 (!) 170/69  Pulse: 62 63 65 69  Resp: (!) 21 20 (!) 22 (!) 21  Temp:      TempSrc:      SpO2: 98% 97% 99% 99%  Weight:      Height:       General: Not in acute distress HEENT:       Eyes: PERRL, EOMI, no scleral icterus.       ENT: No discharge from the ears and nose, no pharynx  injection, no tonsillar enlargement.        Neck: No JVD, no bruit, no mass felt. Heme: No neck lymph node enlargement. Cardiac: S1/S2, RRR, No murmurs, No gallops or rubs. Respiratory: No rales, wheezing, rhonchi or rubs. GI: Soft, nondistended, nontender, no rebound pain, no organomegaly, BS present. GU: No hematuria Ext: has 1+ pitting leg edema bilaterally. 2+DP/PT pulse bilaterally. Musculoskeletal: has left shoulder tenderness. Skin: No rashes.  has skin tear in left eyebrow. Neuro: Alert, oriented X3, cranial nerves II-XII grossly intact, moves all extremities normally.   Psych: Patient is not psychotic, no suicidal or hemocidal ideation.  Labs on Admission: I have personally reviewed following labs and imaging studies  CBC: Recent Labs  Lab 05/13/18 1937  WBC 19.0*  NEUTROABS 15.8*  HGB 14.6  HCT 46.5  MCV 96.1  PLT 371   Basic Metabolic Panel: Recent Labs  Lab 05/13/18 1937  NA 140  K 5.5*  CL 98  CO2 32  GLUCOSE 186*  BUN 31*  CREATININE 1.66*  CALCIUM 9.6   GFR: Estimated Creatinine Clearance: 32 mL/min (A) (by C-G formula based on SCr of 1.66 mg/dL (H)). Liver Function Tests: Recent Labs  Lab 05/13/18 1937  AST 32  ALT 33  ALKPHOS 98  BILITOT 0.9  PROT 6.1*  ALBUMIN 3.4*   No results for input(s): LIPASE, AMYLASE in the last 168 hours. No results for input(s): AMMONIA in the last 168 hours. Coagulation Profile: Recent Labs  Lab 05/13/18 1937  INR 3.17   Cardiac Enzymes: Recent Labs  Lab 05/13/18 1937  TROPONINI <0.03   BNP (last 3 results) No results for input(s): PROBNP in the last 8760 hours. HbA1C: No results for input(s): HGBA1C in the last 72 hours. CBG: Recent Labs  Lab 05/13/18 1923  GLUCAP 174*   Lipid Profile: No results for input(s): CHOL, HDL, LDLCALC, TRIG, CHOLHDL, LDLDIRECT in the last 72 hours. Thyroid Function Tests: No results for input(s): TSH, T4TOTAL, FREET4, T3FREE, THYROIDAB in the  last 72 hours. Anemia  Panel: No results for input(s): VITAMINB12, FOLATE, FERRITIN, TIBC, IRON, RETICCTPCT in the last 72 hours. Urine analysis:    Component Value Date/Time   COLORURINE YELLOW 11/09/2016 1724   APPEARANCEUR CLEAR 11/09/2016 1724   LABSPEC 1.018 11/09/2016 1724   PHURINE 5.0 11/09/2016 1724   GLUCOSEU NEGATIVE 11/09/2016 1724   HGBUR NEGATIVE 11/09/2016 1724   BILIRUBINUR NEGATIVE 11/09/2016 1724   KETONESUR NEGATIVE 11/09/2016 1724   PROTEINUR 30 (A) 11/09/2016 1724   UROBILINOGEN 0.2 05/11/2013 1632   NITRITE NEGATIVE 11/09/2016 1724   LEUKOCYTESUR NEGATIVE 11/09/2016 1724   Sepsis Labs: @LABRCNTIP (procalcitonin:4,lacticidven:4) )No results found for this or any previous visit (from the past 240 hour(s)).   Radiological Exams on Admission: Dg Chest 1 View  Result Date: 05/13/2018 CLINICAL DATA:  Recent fall with left shoulder fracture, initial encounter EXAM: CHEST  1 VIEW COMPARISON:  10/30/2015 FINDINGS: Cardiac shadow is enlarged but stable. Aortic calcifications are again seen. Lungs are well aerated bilaterally with minimal left basilar scarring. No focal infiltrate or effusion is seen. No acute bony abnormality is noted aside from the known left humeral fracture. IMPRESSION: Left humeral fracture. Left basilar scarring. Electronically Signed   By: Inez Catalina M.D.   On: 05/13/2018 20:45   Ct Head Wo Contrast  Result Date: 05/13/2018 CLINICAL DATA:  Patient fell at home hitting head on kitchen table. EXAM: CT HEAD WITHOUT CONTRAST TECHNIQUE: Contiguous axial images were obtained from the base of the skull through the vertex without intravenous contrast. COMPARISON:  03/23/2006 FINDINGS: Brain: Acute left subdural hematoma measuring up to 10 mm in thickness without subfalcine shift is identified overlying the convexity of the left frontal, temporal and parietal lobes. Additional smaller right-sided subdural hematoma is identified overlying the right temporal lobe measuring up to 3 mm  in thickness. No acute intraparenchymal hemorrhage. Chronic microvascular ischemia with atrophy is noted. Vascular: No hyperdense vessel sign. No acute Skull: No acute skull fracture. No suspicious osseous lesions. Sinuses/Orbits: Intact Other: Small 7 mm scalp nodule overlying the right parietal skull minimally larger than prior. IMPRESSION: 1. Acute, left greater than right subdural hematomas measuring up to 10 mm in thickness on the left overlying the left frontal, temporal and parietal lobes and up to 3 mm overlying the right temporal lobe. No midline shift. These results were called by telephone at the time of interpretation on 05/13/2018 at 8:30 pm to [facility] , who verbally acknowledged these results. 2. Chronic stable atrophy and microvascular ischemia. 3. No acute osseous abnormality of the bony calvarium. Electronically Signed   By: Ashley Royalty M.D.   On: 05/13/2018 20:31   Dg Shoulder Left  Result Date: 05/13/2018 CLINICAL DATA:  Recent fall with shoulder pain, initial encounter EXAM: LEFT SHOULDER - 2+ VIEW COMPARISON:  None. FINDINGS: Transverse fracture is noted through the surgical neck of the humerus with displacement of the humerus somewhat medially with respect to the humeral head. Fracture lines extend into the humeral head with mild displacement of the greater tuberosity. No other focal abnormality is seen. IMPRESSION: Comminuted fracture of the proximal left humerus. The fracture involves primarily the surgical neck. Electronically Signed   By: Inez Catalina M.D.   On: 05/13/2018 20:44     EKG: Reviewed independently, QTC 437, LAE, poor R wave progression, LAD, no T wave peaking.  Assessment/Plan Principal Problem:   SDH (subdural hematoma) (H[facility]) Active Problems:   Hyperlipidemia   Coronary artery calcification seen on CT scan  Chronic diastolic CHF (congestive heart failure) ([facility])   PAF (paroxysmal atrial fibrillation) ([facility])   CKD (chronic kidney disease), stage III  (H[facility])   Type II diabetes mellitus with renal manifestations ([facility])   Essential hypertension   GERD (gastroesophageal reflux disease)   Fall   Closed comminuted left humeral fracture   Hyperkalemia   Leukocytosis   SDH (subdural hematoma) ([place]): CT-head showed acute SDH (left much larger than right> without shift or mass effect currently.  Dr. Ellene Route of neurosurgery was consulted.  He recommended to reverse Coumadin and repeat CT of head in the morning.  - will admit to stepdown as inpatient - Highly appreciated neurosurgeon's recommendations - Frequent neuro check - Coumadin reversal: 1 dose of Kcentra and 10 mg of vitamin K - Repeat CT in AM.  Fall: Patient seems to have had mechanical fall, but possibly has LOC shortly.  Currently patient is oriented x3.  No focal neurologic findings on physical examination. -PT/OT when able to. -Wound care consult to follow left eyebrow laceration  Closed comminuted left humeral fracture: EDP discussed orthopedist Dr. Lyla Glassing who felt L shoulder injury will need surgical intervention tomorrow. Per EDP, Dr. Ellene Route mentioned if the repeated head CT tomorrow morning does not show any worsening changes, then pt is OK for shoulder surgical intervention with anesthesia. - Pain control: morphine prn and percocet - When necessary Zofran for nausea - Robaxin for muscle spasm - type and cross - INR/PTT  Hyperlipidemia: -lipitor  Coronary artery calcification seen on CT scan: no CP. -continue lipitor and metoprolol  Chronic diastolic CHF (congestive heart failure) ([place]): 2D echo on 10/31/2015 showed EF 60 to 65%.  Patient has 1+ leg edema, but no JVD.  No pulmonary edema chest x-ray.  No shortness of breath.  CHF seems to be compensated. - Check BNP-->128  PAF (paroxysmal atrial fibrillation) ([place]): CHA2DS2-VASc Score is 6. Will need to hold off coumadin now. Heart rate is well controlled. -Continue metoprolol   CKD (chronic kidney disease), stage III  ([place]): Stable.  Baseline creatinine 1.7-2.0.  His creatinine is 1.66 and BUN 31 -Follow-up renal function by BMP  Type II diabetes mellitus with renal manifestations ([place]): Last A1c 6.3 on 10/31/15, well controled. Patient is taking NovoLog, Lantus at home -will decrease Lantus dose from 50- 40 units twice daily to 30- 20 units twice daily -SSI  HTN:  -Continue home medications: Metoprolol, lisinopril -IV hydralazine prn  GERD: -Protonix  Hyperkalemia: Potassium 5.5 without T wave peaking on EKG. -Patient was treated with D50, NovoLog -Kayexalate 30 g  Leukocytosis: no signs of infection. Likely due to stress induced to demargination. -will follow up blood and urine culture -check UA and get Bx -follow up by CBC  Perioperative Cardiac Risk: He has multiple comorbidities, including dCHF with EF 60-65%, Coronary artery calcification seen on CT scan with normal Nuc 02/2015 was normal, A fib, CKD-III, HTN, HLD, DM, ILD. Currently patient is active and independent of his ADLs, IADLs. He is on lisinopril, metoprolol, Lipitor.  Coumadin is discontinued due to SDH. Patient does not have chest pain, shortness of breath, palpitation.  He has 1+ leg edema, but no signs of acute CHF exacerbation currently. EKG has no acute change. At this time point, no further work up is needed.  His GUPTA score perioperative myocardial infarction or cardaic arrest is 1.62 %, moderate risk.   Inpatient status:  # Patient requires inpatient status due to high intensity of service, high risk for further deterioration and high  frequency of surveillance required.  I certify that at the point of admission it is my clinical judgment that the patient will require inpatient hospital care spanning beyond 2 midnights from the point of admission.  . This patient has multiple chronic comorbidities including hypertension, hyperlipidemia, diabetes mellitus, GERD, dCHF, CKD 3, CAD, PAF on Coumadin, ILD.  Marland Kitchen Now patient has  presenting fall, SDH and left shoulder fracture . The worrisome physical exam findings include left shoulder tenderness . The initial radiographic and laboratory data are worrisome because of SDH and comminuted left proximal humeral surgical neck fracture, hypokalemia, leukocytosis . Current medical needs: please see my assessment and plan . Predictability of an adverse outcome (risk): Patient has multiple comorbidities, now presents with fall, subdural hematoma and left shoulder fracture which will need surgery.  Patient will need to stay in hospital for at least 2 days.  DVT ppx:SCD Code Status: Full code Family Communication:   Yes, patient's wife, nephew, and nephew's wife at bed side Disposition Plan:  To be determined Consults called: Neurosurgeon, Dr. Ellene Route; and orthopedic surgeon, Dr. Lyla Glassing Admission status:  SDU/inpation       Date of Service 05/13/2018    Ivor Costa [facility] Pager [phone]  If 7PM-7AM, please contact night-coverage [email or web address] Password [facility] 05/13/2018, 10:58 PM

## 2018-05-13 NOTE — ED Notes (Signed)
Pt remains alert and oriented at this time.  Family at bedside.  Pt placed on 02 via  at 3LPM

## 2018-05-13 NOTE — ED Notes (Signed)
Provider at bedside,  Afterwards pt going to inpatient floor.

## 2018-05-13 NOTE — ED Notes (Signed)
Pt has lac to left eyebrow, bleeding controlled at this time.

## 2018-05-13 NOTE — ED Provider Notes (Signed)
[place] EMERGENCY DEPARTMENT Provider Note   CSN: [id number] Arrival date & time: 05/13/18  1901     History   Chief Complaint Chief Complaint  Patient presents with  . Fall  . Loss of Consciousness    HPI Justin Leach is a 84 y.o. male.  The history is provided by the patient. No language interpreter was used.  Fall   Loss of Consciousness      84 year old male with history of insulin-dependent diabetes, history of atrial fibrillation currently on Coumadin, CAD, CHF, hypertension brought here via EMS from home for evaluation of a recent fall.  Patient report he lives at home by himself.  He usually walks with a cane.  This morning, he was not using his cane when he walked into his kitchen, lost balance, fell forward, striking his head against the side of a table and believe he had a syncopal episode.  He was on the ground for approximately 1 to 2 hours but finally able to call for help.  He has not been able to ambulate since the fall.  He is complaining of significant pain primarily to his left shoulder as well as a headache.  He does not recall any precipitating symptoms prior to the fall.  He denies any numbness.  He denies any confusion.  No report of nausea or vomiting, no chest pain, shortness of breath, abdominal pain, lower back pain or pain to his lower extremities.  He is unsure of his last tetanus status.  He is currently on Coumadin.  He believes he has lost quite a bit of blood.  No complaint of any significant neck pain.  No specific treatment tried.  Pain is moderate to severe.  Past Medical History:  Diagnosis Date  . Bradycardia    a. during 02/2015 admission - occasional HR in 40s while being treated for atrial fib.  . Cervical disc disease   . Chronic diastolic CHF (congestive heart failure) ([place])    a. Dx 02/2015 -  2D echo 03/02/15 showed severe focal basal hypertrophy of the septum, EF 55-60%, mild MR, no effusion.  . CKD (chronic  kidney disease), stage III ([place])   . Coronary artery calcification seen on CT scan    a. Nuc 02/2015 was normal.  . Dyslipidemia   . GERD (gastroesophageal reflux disease)   . Hypertension   . 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 PAF (paroxysmal atrial fibrillation) ([place])    a. Dx 02/2015 - in and out on tele, rx'd Coumadin and amiodarone.  . Pneumonia 2007  . Shortness of breath dyspnea    WITH EXERTION  . Type II diabetes mellitus [name])     Patient Active Problem List   Diagnosis Date Noted  . Cough   . GERD (gastroesophageal reflux disease)   . Pulmonary fibrosis ([place])   . Bronchitis 10/30/2015  . Coronary artery calcification seen on CT scan   . Chronic diastolic CHF (congestive heart failure) ([place])   . PAF (paroxysmal atrial fibrillation) ([place])   . CKD (chronic kidney disease), stage III ([place])   . Type II diabetes mellitus ([place])   . Essential hypertension   . Acute diastolic heart failure ([place])   . Atrial fibrillation, new onset ([place]) 03/02/2015  . Chest pain 02/28/2015  . Renal failure (ARF), acute on chronic ([place]) 02/28/2015  . Hyperlipidemia 02/28/2015  . Dyspnea 03/29/2014  . DOE (dyspnea on exertion) 03/29/2014  .  Bradycardia 03/29/2014  . Bronchospasm 05/15/2013  . Influenza with respiratory manifestations 05/12/2013    Past Surgical History:  Procedure Laterality Date  . CATARACT EXTRACTION W/ INTRAOCULAR LENS  IMPLANT, BILATERAL Bilateral   . CERVICAL DISC SURGERY     "i've had 3 neck ORs; not sure what kind; all thru the back of my neck"  . CHOLECYSTECTOMY    . COLONOSCOPY WITH PROPOFOL N/A 01/02/2016   Procedure: COLONOSCOPY WITH PROPOFOL;  Surgeon: [name], MD;  Location: [facility] ENDOSCOPY;  Service: Endoscopy;  Laterality: N/A;  . EYE SURGERY  1930's   "don't know what for" (05/11/2013)  . JOINT REPLACEMENT     bilateral knees, elbows, and shoulders (on 05/11/2013 pt denies all joint replacements"     . KNEE ARTHROPLASTY     "had one scope; one open knee OR; not sure which on which side" (05/10/2013)  . KNEE ARTHROSCOPY     "had one scope; one open knee OR; not sure which on which side" (05/10/2013)  . POSTERIOR FUSION CERVICAL SPINE    . ROTATOR CUFF REPAIR Bilateral   . VIDEO BRONCHOSCOPY Bilateral 02/01/2016   Procedure: VIDEO BRONCHOSCOPY WITHOUT FLUORO;  Surgeon: Marshell [name], MD;  Location: [facility] ENDOSCOPY;  Service: Cardiopulmonary;  Laterality: Bilateral;        Home Medications    Prior to Admission medications   Medication Sig Start Date End Date Taking? Authorizing Provider  atorvastatin (LIPITOR) 80 MG tablet Take 40 mg by mouth every evening.  12/27/13   [provider]  Cholecalciferol (VITAMIN D-3) 1000 UNITS CAPS Take 1 capsule by mouth daily.    [provider]  HYDROcodone-acetaminophen (NORCO) 10-325 MG tablet Take 1 tablet by mouth every 8 (eight) hours as needed. 09/12/16   Wallene Huh, DPM  insulin aspart (NOVOLOG) 100 UNIT/ML injection Inject 0-5 Units into the skin 3 (three) times daily before meals. Sliding scale.    [provider]  insulin detemir (LEVEMIR) 100 UNIT/ML injection Inject 40-60 Units into the skin at bedtime. 60 units in the morning and 40 units in the evening. ( depending on how pt feels at the time of dose)    [provider]  lisinopril (PRINIVIL,ZESTRIL) 10 MG tablet Take 1 tablet (10 mg total) by mouth daily. 07/17/15   Mannam, Hart Robinsons, MD  loperamide (IMODIUM) 2 MG capsule Take 1 capsule (2 mg total) by mouth 4 (four) times daily as needed for diarrhea or loose stools. 11/09/16   Nils Flack, Mina A, PA-C  metoprolol (LOPRESSOR) 50 MG tablet Take 50 mg by mouth 2 (two) times daily.    [provider]  omeprazole (PRILOSEC) 20 MG capsule Take 20 mg by mouth daily.  02/26/13   [provider]  vitamin B-12 (CYANOCOBALAMIN) 1000 MCG tablet Take 1,000 mcg by mouth daily.    [provider]   warfarin (COUMADIN) 3 MG tablet Take 3 mg by mouth daily. Take 3 mg daily on Monday, Tuesday, Wednesday, Friday, and Saturday    [provider]  warfarin (COUMADIN) 4 MG tablet Take 4 mg by mouth See admin instructions. Take 4 mg daily on Thursday and Sunday    [provider]    Family History Family History  Problem Relation Age of Onset  . Diabetes Mellitus II Paternal Grandmother   . Heart disease Neg Hx        He does not know his father's history.   . Cancer Neg Hx   . Diabetes Neg Hx   .  CAD Neg Hx   . Lung disease Neg Hx   . Rheumatologic disease Neg Hx     Social History Social History   Tobacco Use  . Smoking status: Never Smoker  . Smokeless tobacco: Former Systems developer    Types: Chew  Substance Use Topics  . Alcohol use: No  . Drug use: No    Comment: used chew when I was a teenager per patient      Allergies   Amiodarone and Nifedipine   Review of Systems Review of Systems  Cardiovascular: Positive for syncope.  All other systems reviewed and are negative.    Physical Exam Updated Vital Signs BP (!) 152/133   Pulse 64   Temp (!) 97.4 F (36.3 C) (Oral)   Resp 19   Ht 5\' 8"  (1.727 m)   Wt 72.6 kg   SpO2 100%   BMI 24.33 kg/m   Physical Exam Vitals signs and nursing note reviewed.  Constitutional:      General: He is not in acute distress.    Appearance: He is well-developed.     Comments: Elderly male laying in bed appears uncomfortable.  HENT:     Head: Normocephalic.     Comments: Less than 1 cm laceration noted to left eyebrow oozing out blood and it is tender to palpation.  Faint ecchymosis noted to left orbital region.  No hemotympanum, no septal hematoma, no occlusion, no midface tenderness. Eyes:     Conjunctiva/sclera: Conjunctivae normal.  Neck:     Musculoskeletal: Neck supple.  Cardiovascular:     Rate and Rhythm: Normal rate and regular rhythm.  Pulmonary:     Effort: Pulmonary effort is normal.     Breath  sounds: Normal breath sounds.  Abdominal:     Palpations: Abdomen is soft.     Tenderness: There is no abdominal tenderness.  Musculoskeletal:        General: Tenderness (Left shoulder: Exquisite tenderness to gentle palpation about the shoulder without any obvious deformity.  Decreased range of motion secondary to pain.) present.     Comments: Able to move both legs with equal strength.  Skin:    Findings: No rash.  Neurological:     Mental Status: He is alert and oriented to person, place, and time.      ED Treatments / Results  Labs (all labs ordered are listed, but only abnormal results are displayed) Labs Reviewed  CBC WITH DIFFERENTIAL/PLATELET - Abnormal; Notable for the following components:      Result Value   WBC 19.0 (*)    Neutro Abs 15.8 (*)    Monocytes Absolute 1.7 (*)    Abs Immature Granulocytes 0.10 (*)    All other components within normal limits  COMPREHENSIVE METABOLIC PANEL - Abnormal; Notable for the following components:   Potassium 5.5 (*)    Glucose, Bld 186 (*)    BUN 31 (*)    Creatinine, Ser 1.66 (*)    Total Protein 6.1 (*)    Albumin 3.4 (*)    GFR calc non Af Amer 37 (*)    GFR calc Af Amer 43 (*)    All other components within normal limits  PROTIME-INR - Abnormal; Notable for the following components:   Prothrombin Time 32.0 (*)    All other components within normal limits  CBG MONITORING, ED - Abnormal; Notable for the following components:   Glucose-Capillary 174 (*)    All other components within normal limits  CULTURE, BLOOD (ROUTINE  X 2)  CULTURE, BLOOD (ROUTINE X 2)  TROPONIN I  URINALYSIS, ROUTINE W REFLEX MICROSCOPIC  PROTIME-INR  BRAIN NATRIURETIC PEPTIDE    EKG None  ED ECG REPORT   Date: 05/13/2018  Rate: 66  Rhythm: normal sinus rhythm  QRS Axis: left  Intervals: PR prolonged  ST/T Wave abnormalities: nonspecific T wave changes  Conduction Disutrbances:none  Narrative Interpretation:   Old EKG Reviewed:  unchanged  I have personally reviewed the EKG tracing and agree with the computerized printout as noted.   Radiology Dg Chest 1 View  Result Date: 05/13/2018 CLINICAL DATA:  Recent fall with left shoulder fracture, initial encounter EXAM: CHEST  1 VIEW COMPARISON:  10/30/2015 FINDINGS: Cardiac shadow is enlarged but stable. Aortic calcifications are again seen. Lungs are well aerated bilaterally with minimal left basilar scarring. No focal infiltrate or effusion is seen. No acute bony abnormality is noted aside from the known left humeral fracture. IMPRESSION: Left humeral fracture. Left basilar scarring. Electronically Signed   By: Inez Catalina M.D.   On: 05/13/2018 20:45   Ct Head Wo Contrast  Result Date: 05/13/2018 CLINICAL DATA:  Patient fell at home hitting head on kitchen table. EXAM: CT HEAD WITHOUT CONTRAST TECHNIQUE: Contiguous axial images were obtained from the base of the skull through the vertex without intravenous contrast. COMPARISON:  03/23/2006 FINDINGS: Brain: Acute left subdural hematoma measuring up to 10 mm in thickness without subfalcine shift is identified overlying the convexity of the left frontal, temporal and parietal lobes. Additional smaller right-sided subdural hematoma is identified overlying the right temporal lobe measuring up to 3 mm in thickness. No acute intraparenchymal hemorrhage. Chronic microvascular ischemia with atrophy is noted. Vascular: No hyperdense vessel sign. No acute Skull: No acute skull fracture. No suspicious osseous lesions. Sinuses/Orbits: Intact Other: Small 7 mm scalp nodule overlying the right parietal skull minimally larger than prior. IMPRESSION: 1. Acute, left greater than right subdural hematomas measuring up to 10 mm in thickness on the left overlying the left frontal, temporal and parietal lobes and up to 3 mm overlying the right temporal lobe. No midline shift. These results were called by telephone at the time of interpretation on  05/13/2018 at 8:30 pm to PA [facility] , who verbally acknowledged these results. 2. Chronic stable atrophy and microvascular ischemia. 3. No acute osseous abnormality of the bony calvarium. Electronically Signed   By: Ashley Royalty M.D.   On: 05/13/2018 20:31   Dg Shoulder Left  Result Date: 05/13/2018 CLINICAL DATA:  Recent fall with shoulder pain, initial encounter EXAM: LEFT SHOULDER - 2+ VIEW COMPARISON:  None. FINDINGS: Transverse fracture is noted through the surgical neck of the humerus with displacement of the humerus somewhat medially with respect to the humeral head. Fracture lines extend into the humeral head with mild displacement of the greater tuberosity. No other focal abnormality is seen. IMPRESSION: Comminuted fracture of the proximal left humerus. The fracture involves primarily the surgical neck. Electronically Signed   By: Inez Catalina M.D.   On: 05/13/2018 20:44    Procedures .Critical Care Performed by: Domenic Moras, PA-C Authorized by: Domenic Moras, PA-C   Critical care provider statement:    Critical care time (minutes):  65   Critical care was time spent personally by me on the following activities:  Discussions with consultants, evaluation of patient's response to treatment, examination of patient, ordering and performing treatments and interventions, ordering and review of laboratory studies, ordering and review of radiographic studies, pulse oximetry, re-evaluation  of patient's condition, obtaining history from patient or surrogate and review of old charts  .[place] KitchenLaceration Repair Date/Time: 05/13/2018 11:29 PM Performed by: Domenic Moras, PA-C Authorized by: Domenic Moras, PA-C   Consent:    Consent obtained:  Verbal   Consent given by:  Patient   Risks discussed:  Infection, need for additional repair, pain, poor cosmetic result and poor wound healing   Alternatives discussed:  No treatment and delayed treatment Universal protocol:    Procedure explained and questions  answered to patient or proxy's satisfaction: yes     Relevant documents present and verified: yes     Test results available and properly labeled: yes     Imaging studies available: yes     Required blood products, implants, devices, and special equipment available: yes     Site/side marked: yes     Immediately prior to procedure, a time out was called: yes     Patient identity confirmed:  Verbally with patient Anesthesia (see MAR for exact dosages):    Anesthesia method:  Local infiltration   Local anesthetic:  Lidocaine 2% WITH epi Laceration details:    Location:  Face   Face location:  L eyebrow   Length (cm):  1   Depth (mm):  4 Repair type:    Repair type:  Simple Pre-procedure details:    Preparation:  Imaging obtained to evaluate for foreign bodies and patient was prepped and draped in usual sterile fashion Exploration:    Contaminated: no   Treatment:    Area cleansed with:  Saline   Amount of cleaning:  Standard Skin repair:    Repair method:  Sutures   Suture size:  5-0   Suture material:  Chromic gut   Suture technique:  Simple interrupted   Number of sutures:  2 Approximation:    Approximation:  Close Post-procedure details:    Dressing:  Open (no dressing)   Patient tolerance of procedure:  Tolerated well, no immediate complications   (including critical care time)  Medications Ordered in ED Medications  sodium polystyrene (KAYEXALATE) 15 GM/60ML suspension 30 g (has no administration in time range)  dextrose 50 % solution 50 mL (has no administration in time range)  insulin aspart (novoLOG) injection 5 Units (has no administration in time range)  oxyCODONE-acetaminophen (PERCOCET/ROXICET) 5-325 MG per tablet 1 tablet (has no administration in time range)  morphine 2 MG/ML injection 0.5 mg (has no administration in time range)  methocarbamol (ROBAXIN) tablet 500 mg (has no administration in time range)  ondansetron (ZOFRAN) injection 4 mg (has no  administration in time range)  hydrALAZINE (APRESOLINE) injection 5 mg (has no administration in time range)  acetaminophen (TYLENOL) tablet 650 mg (has no administration in time range)  zolpidem (AMBIEN) tablet 5 mg (has no administration in time range)  insulin aspart (novoLOG) injection 0-9 Units (has no administration in time range)  morphine 4 MG/ML injection 6 mg (6 mg Intravenous Given 05/13/18 2006)  ondansetron (ZOFRAN) injection 4 mg (4 mg Intravenous Given 05/13/18 2006)  Tdap (BOOSTRIX) injection 0.5 mL (0.5 mLs Intramuscular Given 05/13/18 2013)  prothrombin complex conc human (KCENTRA) IVPB 1,602 Units (0 Units Intravenous Stopped 05/13/18 2143)  phytonadione (VITAMIN K) 10 mg in dextrose 5 % 50 mL IVPB (10 mg Intravenous New Bag/Given 05/13/18 2143)     Initial Impression / Assessment and Plan / ED Course  I have reviewed the triage vital signs and the nursing notes.  Pertinent labs & imaging results that were  available during my care of the patient were reviewed by me and considered in my medical decision making (see chart for details).     BP (!) 159/65   Pulse 62   Temp (!) 97.4 F (36.3 C) (Oral)   Resp 19   Ht 5\' 8"  (1.727 m)   Wt 72.6 kg   SpO2 98%   BMI 24.33 kg/m    Final Clinical Impressions(s) / ED Diagnoses   Final diagnoses:  Subdural hematoma without coma with loss of consciousness, initial encounter ([place])  Shoulder fracture, left, closed, initial encounter  Fall in home, initial encounter  Eyebrow laceration, left, initial encounter    ED Discharge Orders    None     7:56 PM Elderly male currently on Coumadin for paroxysmal A. fib here with a fall with an apparent head injury and left shoulder injury.  Will obtain appropriate imaging.  Pain medication ordered.  8:32 PM Radiologist just called to notify the patient is found to have bilateral subdural hematoma without any shift.  X-ray of L shoulder demonstrate that patient has positive left  humeral head fracture.  I will promptly consult on-call neurosurgeon to notify result of head CT scan. Pt is on Coumadin, INR is 3.17.  Will initial anticoagulant reversal protocol with Kcentra.  Care discussed with Dr. Wilson Singer.    9:05 PM Units secretary have tried multiple attempts to reach out to on-call neurosurgeon for the past 30 minutes without any success.  We will continue with attempt to reach out to our neurosurgeon.  9:27 PM I have consulted with neurosurgeon Dr. Ellene Route who request medicine to admit and he will be available for consultation and management of subdural hematoma.  He felt pt does not need emergent surgical intervention at this time.    9:50 PM Appreciate consultation from [facility] Dr. Blaine Hamper who agrees to admit pt.  He request orthopedic to be involve in L shoulder fracture care.  Will consult orthopedist.   10:06 PM I discussed care with orthopedist Dr. Lyla Glassing who felt L shoulder injury will need surgical intervention tomorrow.  He did request for me to reach out to neurosurgeon Dr. Ellene Route if pt is a candidate for anesthesia tomorrow for shoulder repair.  I will reach out to Dr. Ellene Route for recommendation.    10:27 PM Dr. Ellene Route mentioned if repeat head CT tomorrow morning does not show any worsening changes, then pt is OK for shoulder surgical intervention with anesthesia.      Domenic Moras, PA-C 05/13/18 3007    Virgel Manifold, MD 05/14/18 [phone]

## 2018-05-13 NOTE — ED Notes (Signed)
Dr. Blaine Hamper in to assess pt.  Ortho tech at bedside to apply sling

## 2018-05-13 NOTE — ED Notes (Signed)
Pt in CT at this time.

## 2018-05-13 NOTE — ED Triage Notes (Signed)
Pt BIBA for c/o fall ; pt not sure how he fell; possible LOC , was on ground for approx 1.5 hours  ; on blood thinners; left eyebrow laceration with bleeding controlled ; pt c/o left shoulder/arm pain ; pt alert and oriented x4 and following simple commands

## 2018-05-13 NOTE — Consult Note (Signed)
Reason for Consult:Sub dural Hematoma Referring Physician: Wilson Singer MD  Justin Leach is an 84 y.o. male.  HPI: Patient is an 84 yo male who fell down striking his head on a table and then had a syncope lasting some 20 min or more. CT of the head demonstrates bilateral Ledt greater than right acute subdural hematomas. He also has a shoulder injury but post injury his strength and level of consciousness is good. The patient has a number of comorbidity's and is on coumadin. INR pending.   Past Medical History:  Diagnosis Date  . Bradycardia    a. during 02/2015 admission - occasional HR in 40s while being treated for atrial fib.  . Cervical disc disease   . Chronic diastolic CHF (congestive heart failure) ([place])    a. Dx 02/2015 -  2D echo 03/02/15 showed severe focal basal hypertrophy of the septum, EF 55-60%, mild MR, no effusion.  . CKD (chronic kidney disease), stage III ([place])   . Coronary artery calcification seen on CT scan    a. Nuc 02/2015 was normal.  . Dyslipidemia   . GERD (gastroesophageal reflux disease)   . Hypertension   . 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 PAF (paroxysmal atrial fibrillation) ([place])    a. Dx 02/2015 - in and out on tele, rx'd Coumadin and amiodarone.  . Pneumonia 2007  . Shortness of breath dyspnea    WITH EXERTION  . Type II diabetes mellitus ([place])     Past Surgical History:  Procedure Laterality Date  . CATARACT EXTRACTION W/ INTRAOCULAR LENS  IMPLANT, BILATERAL Bilateral   . CERVICAL DISC SURGERY     "i've had 3 neck ORs; not sure what kind; all thru the back of my neck"  . CHOLECYSTECTOMY    . COLONOSCOPY WITH PROPOFOL N/A 01/02/2016   Procedure: COLONOSCOPY WITH PROPOFOL;  Surgeon: [name], MD;  Location: [facility];  Service: Endoscopy;  Laterality: N/A;  . EYE SURGERY  1930's   "don't know what for" (05/11/2013)  . JOINT REPLACEMENT     bilateral knees, elbows, and shoulders (on  05/11/2013 pt denies all joint replacements"   . KNEE ARTHROPLASTY     "had one scope; one open knee OR; not sure which on which side" (05/10/2013)  . KNEE ARTHROSCOPY     "had one scope; one open knee OR; not sure which on which side" (05/10/2013)  . POSTERIOR FUSION CERVICAL SPINE    . ROTATOR CUFF REPAIR Bilateral   . VIDEO BRONCHOSCOPY Bilateral 02/01/2016   Procedure: VIDEO BRONCHOSCOPY WITHOUT FLUORO;  Surgeon: Marshell [name], MD;  Location: [facility];  Service: Cardiopulmonary;  Laterality: Bilateral;    Family History  Problem Relation Age of Onset  . Diabetes Mellitus II Paternal Grandmother   . Heart disease Neg Hx        He does not know his father's history.   . Cancer Neg Hx   . Diabetes Neg Hx   . CAD Neg Hx   . Lung disease Neg Hx   . Rheumatologic disease Neg Hx     Social History:  reports that he has never smoked. He has quit using smokeless tobacco.  His smokeless tobacco use included chew. He reports that he does not drink alcohol or use drugs.  Allergies:  Allergies  Allergen Reactions  . Amiodarone Other (See Comments)    MD noted 10/31/15: Chest imaging in January showed new findings concerning  for amiodarone toxicity versus NSIP. Patient subsequently taken off amiodarone.  . Nifedipine Other (See Comments)    Reaction: made gums swell up. Pt states that he had to have gum surgery after taking.     Medications: I have reviewed the patient's current medications.  Results for orders placed or performed during the hospital encounter of 05/13/18 (from the past 48 hour(s))  CBG monitoring, ED     Status: Abnormal   Collection Time: 05/13/18  7:23 PM  Result Value Ref Range   Glucose-Capillary 174 (H) 70 - 99 mg/dL  CBC with Differential     Status: Abnormal   Collection Time: 05/13/18  7:37 PM  Result Value Ref Range   WBC 19.0 (H) 4.0 - 10.5 K/uL   RBC 4.84 4.22 - 5.81 MIL/uL   Hemoglobin 14.6 13.0 - 17.0 g/dL   HCT 46.5 39.0 - 52.0 %   MCV 96.1  80.0 - 100.0 fL   MCH 30.2 26.0 - 34.0 pg   MCHC 31.4 30.0 - 36.0 g/dL   RDW 14.7 11.5 - 15.5 %   Platelets 243 150 - 400 K/uL   nRBC 0.0 0.0 - 0.2 %   Neutrophils Relative % 82 %   Neutro Abs 15.8 (H) 1.7 - 7.7 K/uL   Lymphocytes Relative 6 %   Lymphs Abs 1.1 0.7 - 4.0 K/uL   Monocytes Relative 9 %   Monocytes Absolute 1.7 (H) 0.1 - 1.0 K/uL   Eosinophils Relative 1 %   Eosinophils Absolute 0.2 0.0 - 0.5 K/uL   Basophils Relative 1 %   Basophils Absolute 0.1 0.0 - 0.1 K/uL   Immature Granulocytes 1 %   Abs Immature Granulocytes 0.10 (H) 0.00 - 0.07 K/uL    Comment: Performed at [facility], [address] [place], [place] [address]  Comprehensive metabolic panel     Status: Abnormal   Collection Time: 05/13/18  7:37 PM  Result Value Ref Range   Sodium 140 135 - 145 mmol/L   Potassium 5.5 (H) 3.5 - 5.1 mmol/L   Chloride 98 98 - 111 mmol/L   CO2 32 22 - 32 mmol/L   Glucose, Bld 186 (H) 70 - 99 mg/dL   BUN 31 (H) 8 - 23 mg/dL   Creatinine, Ser 1.66 (H) 0.61 - 1.24 mg/dL   Calcium 9.6 8.9 - 10.3 mg/dL   Total Protein 6.1 (L) 6.5 - 8.1 g/dL   Albumin 3.4 (L) 3.5 - 5.0 g/dL   AST 32 15 - 41 U/L   ALT 33 0 - 44 U/L   Alkaline Phosphatase 98 38 - 126 U/L   Total Bilirubin 0.9 0.3 - 1.2 mg/dL   GFR calc non Af Amer 37 (L) >60 mL/min   GFR calc Af Amer 43 (L) >60 mL/min   Anion gap 10 5 - 15    Comment: Performed at [facility], [place] [address] [place], [place] [address]  Troponin I - Once     Status: None   Collection Time: 05/13/18  7:37 PM  Result Value Ref Range   Troponin I <0.03 <0.03 ng/mL    Comment: Performed at [facility] [address] [place], [place] [address]  Protime-INR     Status: Abnormal   Collection Time: 05/13/18  7:37 PM  Result Value Ref Range   Prothrombin Time 32.0 (H) 11.4 - 15.2 seconds   INR 3.17     Comment: Performed at [facility],  [address], [place], [place] [address]    Dg Chest 1 View  Result Date:  05/13/2018 CLINICAL DATA:  Recent fall with left shoulder fracture, initial encounter EXAM: CHEST  1 VIEW COMPARISON:  10/30/2015 FINDINGS: Cardiac shadow is enlarged but stable. Aortic calcifications are again seen. Lungs are well aerated bilaterally with minimal left basilar scarring. No focal infiltrate or effusion is seen. No acute bony abnormality is noted aside from the known left humeral fracture. IMPRESSION: Left humeral fracture. Left basilar scarring. Electronically Signed   By: Inez Catalina M.D.   On: 05/13/2018 20:45   Ct Head Wo Contrast  Result Date: 05/13/2018 CLINICAL DATA:  Patient fell at home hitting head on kitchen table. EXAM: CT HEAD WITHOUT CONTRAST TECHNIQUE: Contiguous axial images were obtained from the base of the skull through the vertex without intravenous contrast. COMPARISON:  03/23/2006 FINDINGS: Brain: Acute left subdural hematoma measuring up to 10 mm in thickness without subfalcine shift is identified overlying the convexity of the left frontal, temporal and parietal lobes. Additional smaller right-sided subdural hematoma is identified overlying the right temporal lobe measuring up to 3 mm in thickness. No acute intraparenchymal hemorrhage. Chronic microvascular ischemia with atrophy is noted. Vascular: No hyperdense vessel sign. No acute Skull: No acute skull fracture. No suspicious osseous lesions. Sinuses/Orbits: Intact Other: Small 7 mm scalp nodule overlying the right parietal skull minimally larger than prior. IMPRESSION: 1. Acute, left greater than right subdural hematomas measuring up to 10 mm in thickness on the left overlying the left frontal, temporal and parietal lobes and up to 3 mm overlying the right temporal lobe. No midline shift. These results were called by telephone at the time of interpretation on 05/13/2018 at 8:30 pm to [facility] , who verbally acknowledged these results. 2. Chronic stable atrophy and microvascular ischemia. 3. No acute osseous  abnormality of the bony calvarium. Electronically Signed   By: Ashley Royalty M.D.   On: 05/13/2018 20:31   Dg Shoulder Left  Result Date: 05/13/2018 CLINICAL DATA:  Recent fall with shoulder pain, initial encounter EXAM: LEFT SHOULDER - 2+ VIEW COMPARISON:  None. FINDINGS: Transverse fracture is noted through the surgical neck of the humerus with displacement of the humerus somewhat medially with respect to the humeral head. Fracture lines extend into the humeral head with mild displacement of the greater tuberosity. No other focal abnormality is seen. IMPRESSION: Comminuted fracture of the proximal left humerus. The fracture involves primarily the surgical neck. Electronically Signed   By: Inez Catalina M.D.   On: 05/13/2018 20:44    Review of Systems  Constitutional: Negative.   HENT: Negative.   Respiratory: Negative.   Cardiovascular:       History of atrial fibrillation on Coumadin  Gastrointestinal: Negative.   Genitourinary: Negative.   Musculoskeletal: Negative.   Skin: Negative.   Neurological: Negative.   Endo/Heme/Allergies:       On Coumadin  Psychiatric/Behavioral: Negative.    Blood pressure (!) 159/65, pulse 62, temperature (!) 97.4 F (36.3 C), temperature source Oral, resp. rate 19, height 5\' 8"  (1.727 m), weight 72.6 kg, SpO2 98 %. Physical Exam  Constitutional: He is oriented to person, place, and time. He appears well-developed and well-nourished.  HENT:  Left periorbital ecchymosis with laceration and contusion over the left orbital rim  Eyes: Pupils are equal, round, and reactive to light. Conjunctivae and EOM are normal.  Neck: Normal range of motion. Neck supple.  Respiratory: Effort normal and breath sounds normal.  GI: Soft. Bowel sounds are normal.  Musculoskeletal:     Comments: Left shoulder extremely painful and swollen evidence of humerus fracture  Neurological: He is alert and oriented to person, place, and time.  Able to move left upper extremity  distally and fingers right upper extremity and both lower extremities move well with good strength cranial nerve examination is intact  Skin: Skin is warm and dry.  Psychiatric: He has a normal mood and affect. His behavior is normal. Judgment and thought content normal.    Assessment/Plan: Acute subdural hematomas left much larger than right without shift or mass effect currently.  Patient on coumadin. Coumadin reversal Observation Repeat CT in AM.  Blanchie Dessert Freya Zobrist 05/13/2018, 9:48 PM

## 2018-05-13 NOTE — Progress Notes (Signed)
Orthopedic Tech Progress Note Patient Details:  LEXTON HIDALGO 1931/11/18 [id number]  Ortho Devices Type of Ortho Device: Arm sling Ortho Device/Splint Location: ULE Ortho Device/Splint Interventions: Adjustment, Application, Ordered   Post Interventions Patient Tolerated: Well Instructions Provided: Care of device   Janit Pagan 05/13/2018, 10:16 PM

## 2018-05-14 ENCOUNTER — Encounter (HOSPITAL_COMMUNITY): Payer: Self-pay | Admitting: *Deleted

## 2018-05-14 ENCOUNTER — Inpatient Hospital Stay (HOSPITAL_COMMUNITY): Payer: Medicare Other

## 2018-05-14 LAB — GLUCOSE, CAPILLARY
GLUCOSE-CAPILLARY: 241 mg/dL — AB (ref 70–99)
Glucose-Capillary: 163 mg/dL — ABNORMAL HIGH (ref 70–99)
Glucose-Capillary: 207 mg/dL — ABNORMAL HIGH (ref 70–99)
Glucose-Capillary: 232 mg/dL — ABNORMAL HIGH (ref 70–99)
Glucose-Capillary: 253 mg/dL — ABNORMAL HIGH (ref 70–99)

## 2018-05-14 LAB — MRSA PCR SCREENING: MRSA by PCR: POSITIVE — AB

## 2018-05-14 LAB — PROTIME-INR
INR: 1.13
Prothrombin Time: 14.4 seconds (ref 11.4–15.2)

## 2018-05-14 LAB — TYPE AND SCREEN
ABO/RH(D): O NEG
Antibody Screen: NEGATIVE

## 2018-05-14 LAB — ABO/RH: ABO/RH(D): O NEG

## 2018-05-14 MED ORDER — SODIUM CHLORIDE 0.9 % IV BOLUS
500.0000 mL | Freq: Once | INTRAVENOUS | Status: AC
  Administered 2018-05-14: 500 mL via INTRAVENOUS

## 2018-05-14 MED ORDER — ATORVASTATIN CALCIUM 40 MG PO TABS
40.0000 mg | ORAL_TABLET | Freq: Every evening | ORAL | Status: DC
  Administered 2018-05-14 – 2018-05-26 (×12): 40 mg via ORAL
  Filled 2018-05-14 (×12): qty 1

## 2018-05-14 MED ORDER — PANTOPRAZOLE SODIUM 40 MG PO TBEC
40.0000 mg | DELAYED_RELEASE_TABLET | Freq: Every day | ORAL | Status: DC
  Administered 2018-05-14 – 2018-05-27 (×14): 40 mg via ORAL
  Filled 2018-05-14 (×14): qty 1

## 2018-05-14 MED ORDER — INSULIN DETEMIR 100 UNIT/ML ~~LOC~~ SOLN
30.0000 [IU] | Freq: Every day | SUBCUTANEOUS | Status: DC
  Administered 2018-05-16 – 2018-05-22 (×5): 30 [IU] via SUBCUTANEOUS
  Filled 2018-05-14 (×10): qty 0.3

## 2018-05-14 MED ORDER — METOPROLOL TARTRATE 50 MG PO TABS
50.0000 mg | ORAL_TABLET | Freq: Two times a day (BID) | ORAL | Status: DC
  Administered 2018-05-14 – 2018-05-27 (×28): 50 mg via ORAL
  Filled 2018-05-14 (×28): qty 1

## 2018-05-14 MED ORDER — CHLORHEXIDINE GLUCONATE CLOTH 2 % EX PADS
6.0000 | MEDICATED_PAD | Freq: Every day | CUTANEOUS | Status: AC
  Administered 2018-05-17 – 2018-05-18 (×2): 6 via TOPICAL

## 2018-05-14 MED ORDER — VITAMIN B-12 1000 MCG PO TABS
1000.0000 ug | ORAL_TABLET | Freq: Every day | ORAL | Status: DC
  Administered 2018-05-14 – 2018-05-27 (×14): 1000 ug via ORAL
  Filled 2018-05-14 (×14): qty 1

## 2018-05-14 MED ORDER — INSULIN DETEMIR 100 UNIT/ML ~~LOC~~ SOLN
20.0000 [IU] | Freq: Every day | SUBCUTANEOUS | Status: DC
  Administered 2018-05-15 – 2018-05-16 (×2): 20 [IU] via SUBCUTANEOUS
  Filled 2018-05-14 (×5): qty 0.2

## 2018-05-14 MED ORDER — LISINOPRIL 10 MG PO TABS
10.0000 mg | ORAL_TABLET | Freq: Every day | ORAL | Status: DC
  Administered 2018-05-14 – 2018-05-15 (×2): 10 mg via ORAL
  Filled 2018-05-14 (×2): qty 1

## 2018-05-14 MED ORDER — MUPIROCIN 2 % EX OINT
1.0000 "application " | TOPICAL_OINTMENT | Freq: Two times a day (BID) | CUTANEOUS | Status: AC
  Administered 2018-05-14 – 2018-05-19 (×10): 1 via NASAL
  Filled 2018-05-14 (×2): qty 22

## 2018-05-14 MED ORDER — PNEUMOCOCCAL VAC POLYVALENT 25 MCG/0.5ML IJ INJ
0.5000 mL | INJECTION | INTRAMUSCULAR | Status: AC
  Administered 2018-05-16: 0.5 mL via INTRAMUSCULAR
  Filled 2018-05-14: qty 0.5

## 2018-05-14 MED ORDER — LOPERAMIDE HCL 2 MG PO CAPS: 2.0000 mg | ORAL_CAPSULE | Freq: Four times a day (QID) | ORAL | Status: DC | PRN

## 2018-05-14 MED ORDER — VITAMIN D 25 MCG (1000 UNIT) PO TABS
1000.0000 [IU] | ORAL_TABLET | Freq: Every day | ORAL | Status: DC
  Administered 2018-05-15 – 2018-05-27 (×13): 1000 [IU] via ORAL
  Filled 2018-05-14 (×13): qty 1

## 2018-05-14 NOTE — Consult Note (Signed)
[facility] Nurse wound consult note Reason for Consult:laceratoin to left elbow. Patient is for surgery today for repair of shoulder injury on left, no report from Nursing on left elbow laceration, no dressing on left elbow, no drainage into sling. Wound type:trauma Pressure Injury POA:N/A Measurement:not able to see Wound bed:N/A Drainage (amount, consistency, odor) None Periwound:N/A Dressing procedure/placement/frequency: Patient's arm is immobilized in sling; exquisite pain when any movement is attempted. As noted above, no dressing to left elbow, no drainage. No wound care indicatged at this time.  [facility] nursing team will not follow, but will remain available to this patient, the nursing and medical teams.  Please re-consult if needed. Thanks, Maudie Flakes, MSN, RN, [place], Arther Abbott  Pager# [phone]

## 2018-05-14 NOTE — Progress Notes (Signed)
PROGRESS NOTE    Justin Leach  YNW:[id number] DOB: [birth year] DOA: 05/13/2018 PCP: Lavone Orn, MD   Brief Narrative: Patient is a 84 year old male with past medical history of hypertension, hyperlipidemia, diabetes mellitus, GERD, diastolic CHF, CKD stage III, coronary artery disease, paroxysmal A. fib on Coumadin, interstitial lung disease who presented with a fall at home.  He striked his head against the side of the table.  He sustained a skin laceration in the left eyebrow and also developed severe pain over the left shoulder and also developed headache.  When he presented, he was found to have INR of 3.1.  X-ray of the left shoulder showed comminuted left humeral surgical neck fracture.  CT head showed bilateral subdural hematoma without midline shift.  He was given vitamin K 10 mg and Kcentra in the emergency department.  Neurosurgery consulted.  Assessment & Plan:   Principal Problem:   SDH (subdural hematoma) (HCC) Active Problems:   Hyperlipidemia   Coronary artery calcification seen on CT scan   Chronic diastolic CHF (congestive heart failure) (HCC)   PAF (paroxysmal atrial fibrillation) (HCC)   CKD (chronic kidney disease), stage III (HCC)   Type II diabetes mellitus with renal manifestations (HCC)   Essential hypertension   GERD (gastroesophageal reflux disease)   Fall   Closed comminuted left humeral fracture   Hyperkalemia   Leukocytosis  Subdural hematoma: CT head on presentation showed acute subdural hematoma left greater than right without shift or mass-effect.  Neurosurgery consulted.  CT head follow-up this morning showed improvement in the subdural hematoma. Neurosurgery not planning for intervention.  He will follow-up with Dr. Mallie Mussel as an outpatient in 3 weeks.  Supratherapeutic INR :INR found to be supratherapeutic on presentation.  He was given vitamin K and Kcentra.  INR in the range of 1 today.  INR will be held most likely permanently.  Fall:  Mechanical fall.  Laceration on the left eyebrow and sustained fracture on the surgical neck of left humerus.  PT/OT evaluation requested.  Closed comminuted left humeral fracture: Orthopedics following.  Continue pain management.  Planning for reverse shoulder arthroplasty by surgery.  INR has already been reversed.  Patient benefits from surgery.  He has mild to moderate risk. Continue pain management.  Continue sling, nonweightbearing for now.  Hyperlipidemia: Continue Lipitor  Coronary artery calcification seen on the CT scan: Continue Lipitor and metoprolol.  Chronic diastolic CHF: 2D echo on 6/57/8469 showed ejection fraction of 65%.  No significant peripheral edema.  Currently CHF compensated.  No pulmonary edema on the chest x-ray.  Paroxysmal A. GEX:[id number] Score is 6. Will need to hold off coumadin now. Heart rate is well controlled.Continue metoprolol.  CKD stage III: Presented with creatinine of 1.66.  Baseline creatinine ranges from 1.7-2.  We will continue to monitor BMP.  Diabetes type 2: Hemoglobin A1c of 6.3 as per 10/31/15.  Well controlled.  Continue Lantus and sliding sensorial.  Hypertension: Continue home medications.  Continue to monitor blood pressure  Hyperkalemia: Presented with potassium of 5.5.  Treated with D50, NovoLog and Kayexalate.  Leukocytosis: Most likely reactive.  Continue to monitor the trend.          DVT prophylaxis:SCD Code Status: Full Family Communication: None present at the bedside Disposition Plan: Pending  PT/OT evaluation.  Orthopedics procedure and clearance   Consultants: Orthopedics, neurosurgery  Procedures: None  Antimicrobials: None  Subjective: Patient seen and examined the bedside this morning.  Remains hemodynamically stable.  Denies any headache.  Complains of pain on the left arm.  Alert and oriented.  Objective: Vitals:   05/13/18 2300 05/14/18 0002 05/14/18 0252 05/14/18 0747  BP: (!) 152/39  (!) 143/47  (!) 132/55  Pulse: 70 68  68  Resp: (!) 25 (!) 24  20  Temp:  98.1 F (36.7 C)  98.1 F (36.7 C)  TempSrc:  Oral  Oral  SpO2: 98% 100%  99%  Weight:      Height:        Intake/Output Summary (Last 24 hours) at 05/14/2018 1219 Last data filed at 05/14/2018 1045 Gross per 24 hour  Intake 20 ml  Output 200 ml  Net -180 ml   Filed Weights   05/13/18 1905  Weight: 72.6 kg    Examination:  General exam: Not in distress,obese HEENT:PERRL,Oral mucosa moist, Ear/Nose normal on gross exam, bruises on periorbital region on the left side. Respiratory system: Bilateral equal air entry, normal vesicular breath sounds, no wheezes or crackles  Cardiovascular system: S1 & S2 heard, RRR. No JVD, murmurs, rubs, gallops or clicks. No pedal edema. Gastrointestinal system: Abdomen is nondistended, soft and nontender. No organomegaly or masses felt. Normal bowel sounds heard. Central nervous system: Alert and oriented. No focal neurological deficits. Extremities: No edema, no clubbing ,no cyanosis, distal peripheral pulses palpable. Tenderness of the left arm.  Left arm in sling. Skin: No rashes, lesions or ulcers,no icterus ,no pallor MSK: Normal muscle bulk,tone ,power Psychiatry: Judgement and insight appear normal. Mood & affect appropriate.     Data Reviewed: I have personally reviewed following labs and imaging studies  CBC: Recent Labs  Lab 05/13/18 1937  WBC 19.0*  NEUTROABS 15.8*  HGB 14.6  HCT 46.5  MCV 96.1  PLT 086   Basic Metabolic Panel: Recent Labs  Lab 05/13/18 1937  NA 140  K 5.5*  CL 98  CO2 32  GLUCOSE 186*  BUN 31*  CREATININE 1.66*  CALCIUM 9.6   GFR: Estimated Creatinine Clearance: 32 mL/min (A) (by C-G formula based on SCr of 1.66 mg/dL (H)). Liver Function Tests: Recent Labs  Lab 05/13/18 1937  AST 32  ALT 33  ALKPHOS 98  BILITOT 0.9  PROT 6.1*  ALBUMIN 3.4*   No results for input(s): LIPASE, AMYLASE in the last 168 hours. No results  for input(s): AMMONIA in the last 168 hours. Coagulation Profile: Recent Labs  Lab 05/13/18 1937 05/13/18 2246 05/14/18 0011  INR 3.17 1.16 1.13   Cardiac Enzymes: Recent Labs  Lab 05/13/18 1937 05/13/18 2246  CKTOTAL  --  215  TROPONINI <0.03  --    BNP (last 3 results) No results for input(s): PROBNP in the last 8760 hours. HbA1C: No results for input(s): HGBA1C in the last 72 hours. CBG: Recent Labs  Lab 05/13/18 1923 05/14/18 0054 05/14/18 0750  GLUCAP 174* 232* 253*   Lipid Profile: No results for input(s): CHOL, HDL, LDLCALC, TRIG, CHOLHDL, LDLDIRECT in the last 72 hours. Thyroid Function Tests: No results for input(s): TSH, T4TOTAL, FREET4, T3FREE, THYROIDAB in the last 72 hours. Anemia Panel: No results for input(s): VITAMINB12, FOLATE, FERRITIN, TIBC, IRON, RETICCTPCT in the last 72 hours. Sepsis Labs: No results for input(s): PROCALCITON, LATICACIDVEN in the last 168 hours.  Recent Results (from the past 240 hour(s))  MRSA PCR Screening     Status: Abnormal   Collection Time: 05/14/18  1:00 AM  Result Value Ref Range Status   MRSA by PCR POSITIVE (A) NEGATIVE Final    Comment:  The GeneXpert MRSA Assay (FDA approved for NASAL specimens only), is one component of a comprehensive MRSA colonization surveillance program. It is not intended to diagnose MRSA infection nor to guide or monitor treatment for MRSA infections. RESULT CALLED TO, READ BACK BY AND VERIFIED WITH: Cain Saupe RN 05/14/18 0319 JDW Performed at [facility], [address], [place], [place] [address]          Radiology Studies: Dg Chest 1 View  Result Date: 05/13/2018 CLINICAL DATA:  Recent fall with left shoulder fracture, initial encounter EXAM: CHEST  1 VIEW COMPARISON:  10/30/2015 FINDINGS: Cardiac shadow is enlarged but stable. Aortic calcifications are again seen. Lungs are well aerated bilaterally with minimal left basilar scarring. No focal infiltrate or  effusion is seen. No acute bony abnormality is noted aside from the known left humeral fracture. IMPRESSION: Left humeral fracture. Left basilar scarring. Electronically Signed   By: Inez Catalina M.D.   On: 05/13/2018 20:45   Ct Head Wo Contrast  Result Date: 05/14/2018 CLINICAL DATA:  Follow up subdural hematomas. EXAM: CT HEAD WITHOUT CONTRAST TECHNIQUE: Contiguous axial images were obtained from the base of the skull through the vertex without intravenous contrast. COMPARISON:  CT HEAD May 13, 2018 FINDINGS: BRAIN: No intraparenchymal hemorrhage, mass effect nor midline shift. Moderate to severe parenchymal brain volume loss, mild sulcal effacement at the convexities and narrowed callosum angle. Faint supratentorial white matter hypodensities less than expected for patient's age, though non-specific are most compatible with chronic small vessel ischemic disease. No acute large vascular territory infarcts. Decreased 6 mm LEFT holo hemispheric and 2 mm RIGHT fronto tentorial dense subdural hematomas. Basal cisterns are patent. VASCULAR: Moderate calcific atherosclerosis of the carotid siphons. SKULL: No skull fracture. Focal LEFT frontal scalp soft tissue swelling, possible contusion. Multifocal scalp scarring. SINUSES/ORBITS: Trace paranasal sinus mucosal thickening. Mastoid air cells are well aerated.The included ocular globes and orbital contents are non-suspicious. Status post bilateral ocular lens implants. OTHER: None. IMPRESSION: 1. Decreased size of bilateral acute subdural hematomas measuring to 6 mm on the LEFT. No midline shift. 2. Image findings of chronic communicating hydrocephalus. Electronically Signed   By: Elon Alas M.D.   On: 05/14/2018 05:29   Ct Head Wo Contrast  Result Date: 05/13/2018 CLINICAL DATA:  Patient fell at home hitting head on kitchen table. EXAM: CT HEAD WITHOUT CONTRAST TECHNIQUE: Contiguous axial images were obtained from the base of the skull through the  vertex without intravenous contrast. COMPARISON:  03/23/2006 FINDINGS: Brain: Acute left subdural hematoma measuring up to 10 mm in thickness without subfalcine shift is identified overlying the convexity of the left frontal, temporal and parietal lobes. Additional smaller right-sided subdural hematoma is identified overlying the right temporal lobe measuring up to 3 mm in thickness. No acute intraparenchymal hemorrhage. Chronic microvascular ischemia with atrophy is noted. Vascular: No hyperdense vessel sign. No acute Skull: No acute skull fracture. No suspicious osseous lesions. Sinuses/Orbits: Intact Other: Small 7 mm scalp nodule overlying the right parietal skull minimally larger than prior. IMPRESSION: 1. Acute, left greater than right subdural hematomas measuring up to 10 mm in thickness on the left overlying the left frontal, temporal and parietal lobes and up to 3 mm overlying the right temporal lobe. No midline shift. These results were called by telephone at the time of interpretation on 05/13/2018 at 8:30 pm to PA [facility] , who verbally acknowledged these results. 2. Chronic stable atrophy and microvascular ischemia. 3. No acute osseous abnormality of  the bony calvarium. Electronically Signed   By: Ashley Royalty M.D.   On: 05/13/2018 20:31   Ct Shoulder Left Wo Contrast  Result Date: 05/13/2018 CLINICAL DATA:  Encounter for left shoulder fracture after fall today. EXAM: CT OF THE UPPER LEFT EXTREMITY WITHOUT CONTRAST TECHNIQUE: Multidetector CT imaging of the upper left extremity was performed according to the standard protocol. COMPARISON:  Same day radiographs of the left shoulder. FINDINGS: Bones/Joint/Cartilage There is an acute comminuted proximal left humerus fracture involving the greater tuberosity and surgical neck. One shaft with anterior displacement of the humeral shaft relative to the humeral head is identified. The humeral head maintains its articulation the glenoid. The [facility] joint is  likewise maintained. The left scapula and included left ribs appear intact. Ligaments Suboptimally assessed by CT. Muscles and Tendons Intramuscular hemorrhage or atrophy. Mild posttraumatic soft tissue induration left axilla. No significant joint effusion. Soft tissues Mild soft tissue contusion overlying the deltoid. IMPRESSION: Neer category 2 part fracture of the proximal left humerus with one shaft width anterior displacement of the humeral shaft relative to the humeral head. Nondisplaced fracture undermining the greater tuberosity is identified. No joint dislocation is seen. Electronically Signed   By: Ashley Royalty M.D.   On: 05/13/2018 22:56   Dg Shoulder Left  Result Date: 05/13/2018 CLINICAL DATA:  Recent fall with shoulder pain, initial encounter EXAM: LEFT SHOULDER - 2+ VIEW COMPARISON:  None. FINDINGS: Transverse fracture is noted through the surgical neck of the humerus with displacement of the humerus somewhat medially with respect to the humeral head. Fracture lines extend into the humeral head with mild displacement of the greater tuberosity. No other focal abnormality is seen. IMPRESSION: Comminuted fracture of the proximal left humerus. The fracture involves primarily the surgical neck. Electronically Signed   By: Inez Catalina M.D.   On: 05/13/2018 20:44        Scheduled Meds: . atorvastatin  40 mg Oral QPM  . Chlorhexidine Gluconate Cloth  6 each Topical Q0600  . cholecalciferol  1,000 Units Oral Daily  . dextrose  50 mL Intravenous Once  . insulin aspart  0-9 Units Subcutaneous TID WC  . insulin detemir  20 Units Subcutaneous QHS  . insulin detemir  30 Units Subcutaneous Daily  . lisinopril  10 mg Oral Daily  . metoprolol tartrate  50 mg Oral BID  . mupirocin ointment  1 application Nasal BID  . pantoprazole  40 mg Oral Daily  . [START ON 05/15/2018] pneumococcal 23 valent vaccine  0.5 mL Intramuscular Tomorrow-1000  . vitamin B-12  1,000 mcg Oral Daily   Continuous  Infusions:   LOS: 1 day    Time spent: 35 mins.More than 50% of that time was spent in counseling and/or coordination of care.      Shelly Coss, MD [facility] Pager [phone]  If 7PM-7AM, please contact night-coverage [email or web address] Password [facility] 05/14/2018, 12:19 PM

## 2018-05-14 NOTE — Progress Notes (Signed)
Patient ID: Justin Leach, male   DOB: 1932/05/03, 84 y.o.   MRN: [id number] Repeat CT this morning shows significant coalescence and shrinkage of both subdural collections.  Patient does not have significant mass-effect.  His coagulopathy has been reversed and he is off Coumadin.  He may undergo orthopedic surgery at any time as per the orthopedists.  I would suggest a follow-up visit for neurosurgery in approximately 3 weeks as an outpatient and will arrange a repeat CT scan.

## 2018-05-14 NOTE — Consult Note (Addendum)
Reason for Consult:Left proximal humerus fx Referring Physician: A Perri Leach is an 84 y.o. male.  HPI: Justin Leach was at home where he lives along when he fell, striking his head on the corner of the kitchen table. He's unsure what caused the fall. He had his phone on him and was able to call his wife (whom he doesn't live with) and 911. He had severe left shoulder pain. He was brought to the ED where x-rays showed Justin left proximal humerus fx and Justin SDH. He was admitted and orthopedic surgery was consulted. He is in Justin sling but can't move his arm. He is LHD.  Past Medical History:  Diagnosis Date  . Bradycardia    Justin. during 02/2015 admission - occasional HR in 40s while being treated for atrial fib.  . Cervical disc disease   . Chronic diastolic CHF (congestive heart failure) ([place])    Justin. Dx 02/2015 -  2D echo 03/02/15 showed severe focal basal hypertrophy of the septum, EF 55-60%, mild MR, no effusion.  . CKD (chronic kidney disease), stage III ([name])   . Coronary artery calcification seen on CT scan    Justin. Nuc 02/2015 was normal.  . Dyslipidemia   . GERD (gastroesophageal reflux disease)   . Hypertension   . 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 PAF (paroxysmal atrial fibrillation) ([name])    Justin. Dx 02/2015 - in and out on tele, rx'd Coumadin and amiodarone.  . Pneumonia 2007  . Shortness of breath dyspnea    WITH EXERTION  . Type II diabetes mellitus ([name])     Past Surgical History:  Procedure Laterality Date  . CATARACT EXTRACTION W/ INTRAOCULAR LENS  IMPLANT, BILATERAL Bilateral   . CERVICAL DISC SURGERY     "i've had 3 neck ORs; not sure what kind; all thru the back of my neck"  . CHOLECYSTECTOMY    . COLONOSCOPY WITH PROPOFOL N/Justin 01/02/2016   Procedure: COLONOSCOPY WITH PROPOFOL;  Surgeon: [name], MD;  Location: [facility];  Service: Endoscopy;  Laterality: N/Justin;  . EYE SURGERY  1930's   "don't know what for"  (05/11/2013)  . JOINT REPLACEMENT     bilateral knees, elbows, and shoulders (on 05/11/2013 pt denies all joint replacements"   . KNEE ARTHROPLASTY     "had one scope; one open knee OR; not sure which on which side" (05/10/2013)  . KNEE ARTHROSCOPY     "had one scope; one open knee OR; not sure which on which side" (05/10/2013)  . POSTERIOR FUSION CERVICAL SPINE    . ROTATOR CUFF REPAIR Bilateral   . VIDEO BRONCHOSCOPY Bilateral 02/01/2016   Procedure: VIDEO BRONCHOSCOPY WITHOUT FLUORO;  Surgeon: Marshell [name], MD;  Location: [facility];  Service: Cardiopulmonary;  Laterality: Bilateral;    Family History  Problem Relation Age of Onset  . Diabetes Mellitus II Paternal Grandmother   . Heart disease Neg Hx        He does not know his father's history.   . Cancer Neg Hx   . Diabetes Neg Hx   . CAD Neg Hx   . Lung disease Neg Hx   . Rheumatologic disease Neg Hx     Social History:  reports that he has never smoked. He has quit using smokeless tobacco.  His smokeless tobacco use included chew. He reports that he does not drink alcohol or use drugs.  Allergies:  Allergies  Allergen Reactions  . 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 Patient subsequently taken off amiodarone.  . Nifedipine Other (See Comments)    Reaction: made gums swell up. Pt states that he had to have gum surgery after taking.     Medications: I have reviewed the patient's current medications.  Results for orders placed or performed during the hospital encounter of 05/13/18 (from the past 48 hour(s))  CBG monitoring, ED     Status: Abnormal   Collection Time: 05/13/18  7:23 PM  Result Value Ref Range   Glucose-Capillary 174 (H) 70 - 99 mg/dL  CBC with Differential     Status: Abnormal   Collection Time: 05/13/18  7:37 PM  Result Value Ref Range   WBC 19.0 (H) 4.0 - 10.5 K/uL   RBC 4.84 4.22 - 5.81  MIL/uL   Hemoglobin 14.6 13.0 - 17.0 g/dL   HCT 46.5 39.0 - 52.0 %   MCV 96.1 80.0 - 100.0 fL   MCH 30.2 26.0 - 34.0 pg   MCHC 31.4 30.0 - 36.0 g/dL   RDW 14.7 11.5 - 15.5 %   Platelets 243 150 - 400 K/uL   nRBC 0.0 0.0 - 0.2 %   Neutrophils Relative % 82 %   Neutro Abs 15.8 (H) 1.7 - 7.7 K/uL   Lymphocytes Relative 6 %   Lymphs Abs 1.1 0.7 - 4.0 K/uL   Monocytes Relative 9 %   Monocytes Absolute 1.7 (H) 0.1 - 1.0 K/uL   Eosinophils Relative 1 %   Eosinophils Absolute 0.2 0.0 - 0.5 K/uL   Basophils Relative 1 %   Basophils Absolute 0.1 0.0 - 0.1 K/uL   Immature Granulocytes 1 %   Abs Immature Granulocytes 0.10 (H) 0.00 - 0.07 K/uL    Comment: Performed at [facility], [address], [place], [name] [address]  Comprehensive metabolic panel     Status: Abnormal   Collection Time: 05/13/18  7:37 PM  Result Value Ref Range   Sodium 140 135 - 145 mmol/L   Potassium 5.5 (H) 3.5 - 5.1 mmol/L   Chloride 98 98 - 111 mmol/L   CO2 32 22 - 32 mmol/L   Glucose, Bld 186 (H) 70 - 99 mg/dL   BUN 31 (H) 8 - 23 mg/dL   Creatinine, Ser 1.66 (H) 0.61 - 1.24 mg/dL   Calcium 9.6 8.9 - 10.3 mg/dL   Total Protein 6.1 (L) 6.5 - 8.1 g/dL   Albumin 3.4 (L) 3.5 - 5.0 g/dL   AST 32 15 - 41 U/L   ALT 33 0 - 44 U/L   Alkaline Phosphatase 98 38 - 126 U/L   Total Bilirubin 0.9 0.3 - 1.2 mg/dL   GFR calc non Af Amer 37 (L) >60 mL/min   GFR calc Af Amer 43 (L) >60 mL/min   Anion gap 10 5 - 15    Comment: Performed at [facility], [place] [address], [place], [place] [address]  Troponin I - Once     Status: None   Collection Time: 05/13/18  7:37 PM  Result Value Ref Range   Troponin I <0.03 <0.03 ng/mL    Comment: Performed at [facility] [address], [place], [name] [address]  Protime-INR     Status: Abnormal   Collection Time: 05/13/18  7:37 PM  Result Value Ref Range   Prothrombin  Time 32.0 (H) 11.4 - 15.2 seconds   INR 3.17     Comment: Performed at [facility], [place] [name], [place], [place] [address]  Brain natriuretic peptide     Status: Abnormal   Collection Time: 05/13/18 10:13 PM  Result Value Ref Range   B Natriuretic Peptide 129.8 (H) 0.0 - 100.0 pg/mL    Comment: Performed at [facility] [address] [place], [place] [address]  Protime-INR     Status: None   Collection Time: 05/13/18 10:46 PM  Result Value Ref Range   Prothrombin Time 14.7 11.4 - 15.2 seconds   INR 1.16     Comment: Performed at [facility] [address], [place], [place] [address]  CK     Status: None   Collection Time: 05/13/18 10:46 PM  Result Value Ref Range   Total CK 215 49 - 397 U/L    Comment: Performed at [facility], [place] [address] [place], [place] [address]  APTT     Status: None   Collection Time: 05/13/18 10:46 PM  Result Value Ref Range   aPTT 27 24 - 36 seconds    Comment: Performed at [facility] [address] [place], [place] [address]  Type and screen [name]     Status: None   Collection Time: 05/14/18 12:11 AM  Result Value Ref Range   ABO/RH(D) O NEG    Antibody Screen NEG    Sample Expiration      05/17/2018 Performed at [facility], [place] [address] [place], [place] [address]   Protime-INR     Status: None   Collection Time: 05/14/18 12:11 AM  Result Value Ref Range   Prothrombin Time 14.4 11.4 - 15.2 seconds   INR 1.13     Comment: Performed at [facility] [address], [place], [place] [address]  ABO/Rh     Status: None   Collection Time: 05/14/18 12:11 AM  Result Value Ref Range   ABO/RH(D)      O NEG Performed at [facility] [address], [place], [place] [address]   Glucose, capillary     Status: Abnormal   Collection Time: 05/14/18 12:54 AM  Result Value Ref Range   Glucose-Capillary 232 (H) 70 - 99 mg/dL  MRSA PCR Screening     Status: Abnormal   Collection Time: 05/14/18  1:00 AM  Result Value Ref Range   MRSA by PCR POSITIVE (Justin)  NEGATIVE    Comment:        The GeneXpert MRSA Assay (FDA approved for NASAL specimens only), is one component of Justin comprehensive MRSA colonization surveillance program. It is not intended to diagnose MRSA infection nor to guide or monitor treatment for MRSA infections. RESULT CALLED TO, READ BACK BY AND VERIFIED WITH: Cain Saupe RN 05/14/18 0319 JDW Performed at [facility], [address], [place], [place] [address]   Glucose, capillary     Status: Abnormal   Collection Time: 05/14/18  7:50 AM  Result Value Ref Range   Glucose-Capillary 253 (H) 70 - 99 mg/dL    Dg Chest 1 View  Result Date: 05/13/2018 CLINICAL DATA:  Recent fall with left shoulder fracture, initial encounter EXAM: CHEST  1 VIEW COMPARISON:  10/30/2015 FINDINGS: Cardiac shadow is enlarged but stable. Aortic calcifications are again seen. Lungs are well aerated bilaterally with minimal left basilar  scarring. No focal infiltrate or effusion is seen. No acute bony abnormality is noted aside from the known left humeral fracture. IMPRESSION: Left humeral fracture. Left basilar scarring. Electronically Signed   By: Inez Catalina M.D.   On: 05/13/2018 20:45   Ct Head Wo Contrast  Result Date: 05/14/2018 CLINICAL DATA:  Follow up subdural hematomas. EXAM: CT HEAD WITHOUT CONTRAST TECHNIQUE: Contiguous axial images were obtained from the base of the skull through the vertex without intravenous contrast. COMPARISON:  CT HEAD May 13, 2018 FINDINGS: BRAIN: No intraparenchymal hemorrhage, mass effect nor midline shift. Moderate to severe parenchymal brain volume loss, mild sulcal effacement at the convexities and narrowed callosum angle. Faint supratentorial white matter hypodensities less than expected for patient's age, though non-specific are most compatible with chronic small vessel ischemic disease. No acute large vascular territory infarcts. Decreased 6 mm LEFT holo hemispheric and 2 mm RIGHT fronto tentorial dense  subdural hematomas. Basal cisterns are patent. VASCULAR: Moderate calcific atherosclerosis of the carotid siphons. SKULL: No skull fracture. Focal LEFT frontal scalp soft tissue swelling, possible contusion. Multifocal scalp scarring. SINUSES/ORBITS: Trace paranasal sinus mucosal thickening. Mastoid air cells are well aerated.The included ocular globes and orbital contents are non-suspicious. Status post bilateral ocular lens implants. OTHER: None. IMPRESSION: 1. Decreased size of bilateral acute subdural hematomas measuring to 6 mm on the LEFT. No midline shift. 2. Image findings of chronic communicating hydrocephalus. Electronically Signed   By: Elon Alas M.D.   On: 05/14/2018 05:29   Ct Head Wo Contrast  Result Date: 05/13/2018 CLINICAL DATA:  Patient fell at home hitting head on kitchen table. EXAM: CT HEAD WITHOUT CONTRAST TECHNIQUE: Contiguous axial images were obtained from the base of the skull through the vertex without intravenous contrast. COMPARISON:  03/23/2006 FINDINGS: Brain: Acute left subdural hematoma measuring up to 10 mm in thickness without subfalcine shift is identified overlying the convexity of the left frontal, temporal and parietal lobes. Additional smaller right-sided subdural hematoma is identified overlying the right temporal lobe measuring up to 3 mm in thickness. No acute intraparenchymal hemorrhage. Chronic microvascular ischemia with atrophy is noted. Vascular: No hyperdense vessel sign. No acute Skull: No acute skull fracture. No suspicious osseous lesions. Sinuses/Orbits: Intact Other: Small 7 mm scalp nodule overlying the right parietal skull minimally larger than prior. IMPRESSION: 1. Acute, left greater than right subdural hematomas measuring up to 10 mm in thickness on the left overlying the left frontal, temporal and parietal lobes and up to 3 mm overlying the right temporal lobe. No midline shift. These results were called by telephone at the time of  interpretation on 05/13/2018 at 8:30 pm to PA [facility] , who verbally acknowledged these results. 2. Chronic stable atrophy and microvascular ischemia. 3. No acute osseous abnormality of the bony calvarium. Electronically Signed   By: Ashley Royalty M.D.   On: 05/13/2018 20:31   Ct Shoulder Left Wo Contrast  Result Date: 05/13/2018 CLINICAL DATA:  Encounter for left shoulder fracture after fall today. EXAM: CT OF THE UPPER LEFT EXTREMITY WITHOUT CONTRAST TECHNIQUE: Multidetector CT imaging of the upper left extremity was performed according to the standard protocol. COMPARISON:  Same day radiographs of the left shoulder. FINDINGS: Bones/Joint/Cartilage There is an acute comminuted proximal left humerus fracture involving the greater tuberosity and surgical neck. One shaft with anterior displacement of the humeral shaft relative to the humeral head is identified. The humeral head maintains its articulation the glenoid. The [facility] joint is likewise maintained. The left scapula  and included left ribs appear intact. Ligaments Suboptimally assessed by CT. Muscles and Tendons Intramuscular hemorrhage or atrophy. Mild posttraumatic soft tissue induration left axilla. No significant joint effusion. Soft tissues Mild soft tissue contusion overlying the deltoid. IMPRESSION: Neer category 2 part fracture of the proximal left humerus with one shaft width anterior displacement of the humeral shaft relative to the humeral head. Nondisplaced fracture undermining the greater tuberosity is identified. No joint dislocation is seen. Electronically Signed   By: Ashley Royalty M.D.   On: 05/13/2018 22:56   Dg Shoulder Left  Result Date: 05/13/2018 CLINICAL DATA:  Recent fall with shoulder pain, initial encounter EXAM: LEFT SHOULDER - 2+ VIEW COMPARISON:  None. FINDINGS: Transverse fracture is noted through the surgical neck of the humerus with displacement of the humerus somewhat medially with respect to the humeral head. Fracture  lines extend into the humeral head with mild displacement of the greater tuberosity. No other focal abnormality is seen. IMPRESSION: Comminuted fracture of the proximal left humerus. The fracture involves primarily the surgical neck. Electronically Signed   By: Inez Catalina M.D.   On: 05/13/2018 20:44    Review of Systems  Constitutional: Negative for weight loss.  HENT: Negative for ear discharge, ear pain, hearing loss and tinnitus.   Eyes: Negative for blurred vision, double vision, photophobia and pain.  Respiratory: Positive for sputum production. Negative for cough and shortness of breath.   Cardiovascular: Negative for chest pain.  Gastrointestinal: Negative for abdominal pain, nausea and vomiting.  Genitourinary: Negative for dysuria, flank pain, frequency and urgency.  Musculoskeletal: Positive for joint pain (Left shoulder). Negative for back pain, falls, myalgias and neck pain.  Neurological: Negative for dizziness, tingling, sensory change, focal weakness, loss of consciousness and headaches.  Endo/Heme/Allergies: Does not bruise/bleed easily.  Psychiatric/Behavioral: Negative for depression, memory loss and substance abuse. The patient is not nervous/anxious.    Blood pressure (!) 132/55, pulse 68, temperature 98.1 F (36.7 C), temperature source Oral, resp. rate 20, height 5\' 8"  (1.727 m), weight 72.6 kg, SpO2 99 %. Physical Exam  Constitutional: He appears well-developed and well-nourished. No distress.  HENT:  Head: Normocephalic and atraumatic.  Eyes: Conjunctivae are normal. Right eye exhibits no discharge. Left eye exhibits no discharge. No scleral icterus.  Neck: Normal range of motion.  Cardiovascular: Normal rate and regular rhythm.  Respiratory: Effort normal. No respiratory distress.  Musculoskeletal:     Comments: Left shoulder, elbow, wrist, digits- no skin wounds, severe TTP, severe pain with PROM  Sens  Ax/R/M/U intact  Mot   Ax/ R/ PIN/ M/ AIN/ U intact  Rad  2+  Neurological: He is alert.  Skin: Skin is warm and dry. He is not diaphoretic.  Psychiatric: He has Justin normal mood and affect. His behavior is normal.    Assessment/Plan: Fall Left humerus fx -- Will likely need reverse shoulder arthroplasty once anticoagulation wears off and he is cleared for surgery. Dr. Stann Mainland to review CT and solidify plan. Sling, NWB for now. SDH Multiple medical problems including HTN, HLD, DM, GERD, dCHF, CKD 3, CAD, afib on Coumadin, and ILD -- per primary team    Lisette Abu, PA-C Orthopedic Surgery [phone] 05/14/2018, 9:25 AM   Attestation statement:  I have examined the patient and reviewed the note as dictated by Hilbert Odor, PA-C.  I agree with his findings and assessment.   This is Justin left-hand-dominant individual that would benefit from operative intervention for his left proximal humerus fracture.  Likely  he will need Justin reverse shoulder arthroplasty.  Certainly if his bone quality is adequate in the humeral head we would attempt open reduction internal fixation.  However, I have reservations as to whether or not the humeral head will not go on to develop AVN given the lack of medial calcar bone fragment and loss of the medial hinge.  I have discussed my plan with the patient to likely move forward with reverse shoulder arthroplasty.  He is in agreement with that plan.  Due to scheduling conflicts we will place him on the operative schedule for Tuesday, December 31.  In the meanwhile he may have Justin normal diet and can work with occupational and physical therapy with no weightbearing to the left arm but he may remove the sling to work on elbow range of motion as tolerated.  He can also do hand and wrist range of motion as tolerated.  We will follow along and again plan for surgery next Tuesday on December 31.   [name] Orthopedic surgeon [facility]

## 2018-05-14 NOTE — Evaluation (Signed)
Physical Therapy Evaluation Patient Details Name: Justin Leach MRN: [id number] DOB: 1931-10-09 Today's Date: 05/14/2018   History of Present Illness  Patient is a 84 year old male with past medical history of hypertension, hyperlipidemia, diabetes mellitus, GERD, diastolic CHF, CKD stage III, coronary artery disease, paroxysmal A. fib on Coumadin, interstitial lung disease who presented with a fall at home.  X-ray of the left shoulder showed comminuted left humeral surgical neck fracture.  CT head showed bilateral subdural hematoma without midline shift.  INR found to be 3.1 now s/p Vit K and awaiting correction of INR prior to surgical fixation of shoulder.   Clinical Impression  Patient presents with decreased mobility due to pain, limited safety, decreased balance, NWB L UE and decreased activity tolerance.  He will benefit from skilled PT in the acute setting to allow return home following SNF level rehab stay.  Patient reports his wife will be unable to assist him at home.  Today only able to tolerate EOB with much pain and able to sit with S about 8 minute for LE therex.  Encouraged deep breathing though pt anxious about pain and with c/o difficulty breathing with difficult tasks.  Will continue to follow.   Follow Up Recommendations SNF;Supervision/Assistance - 24 hour    Equipment Recommendations  Other (comment)(TBA)    Recommendations for Other Services       Precautions / Restrictions Precautions Precautions: Fall Required Braces or Orthoses: Sling Restrictions Weight Bearing Restrictions: Yes LUE Weight Bearing: Non weight bearing      Mobility  Bed Mobility Overal bed mobility: Needs Assistance Bed Mobility: Supine to Sit;Sit to Supine     Supine to sit: HOB elevated;Max assist;+2 for physical assistance Sit to supine: +2 for physical assistance;Max assist   General bed mobility comments: assist to bring legs to EOB and heavy to lift trunk upright with pt c/o  pain throughout.  assist to supine for legs and trunk once again, cues for pt to hold onto L arm.  assist to scoot to [place] transfer comment: unable to tolerate  Ambulation/Gait                Stairs            Wheelchair Mobility    Modified Rankin (Stroke Patients Only)       Balance Overall balance assessment: Needs assistance   Sitting balance-Leahy Scale: Fair Sitting balance - Comments: sat EOB about 8 minutes performed LE therex                                     Pertinent Vitals/Pain Pain Assessment: Faces Faces Pain Scale: Hurts worst Pain Location: L shoulder and generalized with mobility Pain Descriptors / Indicators: Aching;Sore;Sharp;Guarding;Moaning Pain Intervention(s): Monitored during session;Repositioned;Patient requesting pain meds-RN notified;Limited activity within patient's tolerance    Home Living Family/patient expects to be discharged to:: Private residence Living Arrangements: Alone Available Help at Discharge: Family Type of Home: House Home Access: Ramped entrance     Home Layout: One level Home Equipment: Mining engineer - 2 wheels Additional Comments: was about to update bathroom for walk in shower, but hasn't started yet    Prior Function Level of Independence: Independent with assistive device(s)               Hand  Dominance        Extremity/Trunk Assessment   Upper Extremity Assessment Upper Extremity Assessment: LUE deficits/detail LUE Deficits / Details: painful even when trying to get him to move his fingers LUE: Unable to fully assess due to pain;Unable to fully assess due to immobilization    Lower Extremity Assessment Lower Extremity Assessment: Generalized weakness    Cervical / Trunk Assessment Cervical / Trunk Assessment: Kyphotic  Communication   Communication: No difficulties;HOH  Cognition Arousal/Alertness: Awake/alert Behavior  During Therapy: Anxious Overall Cognitive Status: No family/caregiver present to determine baseline cognitive functioning                                 General Comments: pain focused, hyperverbal, cues needed to focus on breathing due to anxious during mobility      General Comments General comments (skin integrity, edema, etc.): patient on O2 @ 2LPM on RA desat to 88%, back up to 92% on O2 with cues for PLB, patient c/o unable to breathe when painful and anxious with cues needed for slow deep breaths due to c/o unable to breathe    Exercises Other Exercises Other Exercises: seated LAQ, AP's trunk extension    Assessment/Plan    PT Assessment Patient needs continued PT services  PT Problem List Decreased balance;Decreased strength;Decreased range of motion;Decreased activity tolerance;Decreased safety awareness;Decreased knowledge of use of DME;Decreased mobility;Decreased knowledge of precautions       PT Treatment Interventions DME instruction;Functional mobility training;Balance training;Patient/family education;Gait training;Therapeutic activities;Therapeutic exercise    PT Goals (Current goals can be found in the Care Plan section)  Acute Rehab PT Goals Patient Stated Goal: to return to independent, knows he will need rehab PT Goal Formulation: With patient Time For Goal Achievement: 05/28/18 Potential to Achieve Goals: Fair    Frequency Min 3X/week   Barriers to discharge        Co-evaluation               AM-PAC PT "6 Clicks" Mobility  Outcome Measure Help needed turning from your back to your side while in a flat bed without using bedrails?: A Lot Help needed moving from lying on your back to sitting on the side of a flat bed without using bedrails?: Total Help needed moving to and from a bed to a chair (including a wheelchair)?: Total Help needed standing up from a chair using your arms (e.g., wheelchair or bedside chair)?: Total Help needed  to walk in hospital room?: Total Help needed climbing 3-5 steps with a railing? : Total 6 Click Score: 7    End of Session Equipment Utilized During Treatment: Gait belt;Other (comment)(sling) Activity Tolerance: Patient limited by pain Patient left: with call bell/phone within reach;in bed;with family/visitor present Nurse Communication: Mobility status PT Visit Diagnosis: Other abnormalities of gait and mobility (R26.89);History of falling (Z91.81);Pain Pain - Right/Left: Left Pain - part of body: Shoulder    Time: 1610-9604 PT Time Calculation (min) (ACUTE ONLY): 28 min   Charges:   PT Evaluation $PT Eval Moderate Complexity: 1 Mod PT Treatments $Therapeutic Activity: 8-22 mins        Magda Kiel, [place] Acute Rehabilitation Services [phone] 05/14/2018   Reginia Naas 05/14/2018, 3:33 PM

## 2018-05-14 NOTE — [place] FL2 (Signed)
[place] MEDICAID FL2 LEVEL OF CARE SCREENING TOOL     IDENTIFICATION  Patient Name: Justin Leach Birthdate: 1931/11/26 Sex: male Admission Date (Current Location): 05/13/2018  [facility] and [place] Number:  Herbalist and Address:  [facility] [place], [place] [address] [place], [place] [address]      Provider Number: [id number]  Attending Physician Name and Address:  Shelly Coss, MD  Relative Name and Phone Number:  Jhordan Mckibben, spouse, [phone]    Current Level of Care: Hospital Recommended Level of Care: [place] Prior Approval Number:    Date Approved/Denied:   PASRR Number: [id number]  Discharge Plan: SNF    Current Diagnoses: Patient Active Problem List   Diagnosis Date Noted  . Fall 05/13/2018  . SDH (subdural hematoma) ([place]) 05/13/2018  . Closed comminuted left humeral fracture 05/13/2018  . Hyperkalemia 05/13/2018  . Leukocytosis 05/13/2018  . Fall at home   . Subdural hematoma without coma ([place])   . Cough   . GERD (gastroesophageal reflux disease)   . Pulmonary fibrosis ([place])   . Bronchitis 10/30/2015  . Coronary artery calcification seen on CT scan   . Chronic diastolic CHF (congestive heart failure) ([place])   . PAF (paroxysmal atrial fibrillation) ([place])   . CKD (chronic kidney disease), stage III ([place])   . Type II diabetes mellitus with renal manifestations ([place])   . Essential hypertension   . Acute diastolic heart failure ([place])   . Atrial fibrillation, new onset ([place]) 03/02/2015  . Chest pain 02/28/2015  . Renal failure (ARF), acute on chronic ([place]) 02/28/2015  . Hyperlipidemia 02/28/2015  . Dyspnea 03/29/2014  . DOE (dyspnea on exertion) 03/29/2014  . Bradycardia 03/29/2014  . Bronchospasm 05/15/2013  . Influenza with respiratory manifestations 05/12/2013    Orientation RESPIRATION BLADDER Height & Weight     Self, Time, Situation, Place  Normal Continent, External catheter Weight:  160 lb (72.6 kg) Height:  5\' 8"  (172.7 cm)  BEHAVIORAL SYMPTOMS/MOOD NEUROLOGICAL BOWEL NUTRITION STATUS      Continent Diet(see discharge summary)  AMBULATORY STATUS COMMUNICATION OF NEEDS Skin   Extensive Assist Verbally Skin abrasions, Surgical wounds(laceration above left eye with stitches; left proximal humerus fx sling and surgery this admission)                       Personal Care Assistance Level of Assistance  Bathing, Feeding, Dressing Bathing Assistance: Maximum assistance Feeding assistance: Limited assistance Dressing Assistance: Maximum assistance     Functional Limitations Info  Sight, Hearing, Speech Sight Info: Adequate(wears glasses) Hearing Info: Impaired Speech Info: Adequate    SPECIAL CARE FACTORS FREQUENCY  PT (By licensed PT), OT (By licensed OT)     PT Frequency: 5x week OT Frequency: 5x week            Contractures Contractures Info: Not present    Additional Factors Info  Code Status, Allergies, Insulin Sliding Scale Code Status Info: Full Code Allergies Info: AMIODARONE, NIFEDIPINE    Insulin Sliding Scale Info: insulin aspart (novoLOG) injection 0-9 Units 3x daily with meals; insulin detemir (LEVEMIR) injection 20 Units daily at bedtime; insulin detemir (LEVEMIR) injection 30 Units daily       Current Medications (05/14/2018):  This is the current hospital active medication list Current Facility-Administered Medications  Medication Dose Route Frequency Provider Last Rate Last Dose  . acetaminophen (TYLENOL) tablet 650 mg  650 mg Oral Q6H PRN Ivor Costa, MD      .  albuterol (PROVENTIL) (2.5 MG/3ML) 0.083% nebulizer solution 2.5 mg  2.5 mg Nebulization Q4H PRN Ivor Costa, MD      . atorvastatin (LIPITOR) tablet 40 mg  40 mg Oral QPM Ivor Costa, MD      . Chlorhexidine Gluconate Cloth 2 % PADS 6 each  6 each Topical Q0600 Shelly Coss, MD      . cholecalciferol (VITAMIN D3) tablet 1,000 Units  1,000 Units Oral Daily Ivor Costa, MD       . dextrose 50 % solution 50 mL  50 mL Intravenous Once Ivor Costa, MD      . hydrALAZINE (APRESOLINE) injection 5 mg  5 mg Intravenous Q2H PRN Ivor Costa, MD      . insulin aspart (novoLOG) injection 0-9 Units  0-9 Units Subcutaneous TID WC Ivor Costa, MD   3 Units at 05/14/18 1211  . insulin detemir (LEVEMIR) injection 20 Units  20 Units Subcutaneous QHS Ivor Costa, MD      . insulin detemir (LEVEMIR) injection 30 Units  30 Units Subcutaneous Daily Ivor Costa, MD      . lisinopril (PRINIVIL,ZESTRIL) tablet 10 mg  10 mg Oral Daily Ivor Costa, MD   10 mg at 05/14/18 0848  . loperamide (IMODIUM) capsule 2 mg  2 mg Oral QID PRN Ivor Costa, MD      . methocarbamol (ROBAXIN) tablet 500 mg  500 mg Oral Q8H PRN Ivor Costa, MD   500 mg at 05/14/18 0847  . metoprolol tartrate (LOPRESSOR) tablet 50 mg  50 mg Oral BID Ivor Costa, MD   50 mg at 05/14/18 0847  . morphine 2 MG/ML injection 0.5 mg  0.5 mg Intravenous Q3H PRN Ivor Costa, MD   0.5 mg at 05/14/18 1310  . mupirocin ointment (BACTROBAN) 2 % 1 application  1 application Nasal BID Shelly Coss, MD      . ondansetron (ZOFRAN) injection 4 mg  4 mg Intravenous Q8H PRN Ivor Costa, MD      . oxyCODONE-acetaminophen (PERCOCET/ROXICET) 5-325 MG per tablet 1 tablet  1 tablet Oral Q4H PRN Ivor Costa, MD   1 tablet at 05/14/18 1310  . pantoprazole (PROTONIX) EC tablet 40 mg  40 mg Oral Daily Ivor Costa, MD   40 mg at 05/14/18 0847  . [START ON 05/15/2018] pneumococcal 23 valent vaccine (PNU-IMMUNE) injection 0.5 mL  0.5 mL Intramuscular Tomorrow-1000 Ivor Costa, MD      . polyethylene glycol (MIRALAX / GLYCOLAX) packet 17 g  17 g Oral Daily PRN Ivor Costa, MD      . vitamin B-12 (CYANOCOBALAMIN) tablet 1,000 mcg  1,000 mcg Oral Daily Ivor Costa, MD   1,000 mcg at 05/14/18 0847  . zolpidem (AMBIEN) tablet 5 mg  5 mg Oral QHS PRN Ivor Costa, MD         Discharge Medications: Please see discharge summary for a list of discharge medications.  Relevant  Imaging Results:  Relevant Lab Results:   Additional Information [id number] [place], [place]

## 2018-05-14 NOTE — Plan of Care (Signed)

## 2018-05-15 LAB — CBC WITH DIFFERENTIAL/PLATELET
Abs Immature Granulocytes: 0.12 10*3/uL — ABNORMAL HIGH (ref 0.00–0.07)
Basophils Absolute: 0.1 10*3/uL (ref 0.0–0.1)
Basophils Relative: 1 %
EOS PCT: 1 %
Eosinophils Absolute: 0.2 10*3/uL (ref 0.0–0.5)
HCT: 36.4 % — ABNORMAL LOW (ref 39.0–52.0)
Hemoglobin: 11.3 g/dL — ABNORMAL LOW (ref 13.0–17.0)
Immature Granulocytes: 1 %
Lymphocytes Relative: 8 %
Lymphs Abs: 1.2 10*3/uL (ref 0.7–4.0)
MCH: 30.3 pg (ref 26.0–34.0)
MCHC: 31 g/dL (ref 30.0–36.0)
MCV: 97.6 fL (ref 80.0–100.0)
Monocytes Absolute: 2.1 10*3/uL — ABNORMAL HIGH (ref 0.1–1.0)
Monocytes Relative: 14 %
Neutro Abs: 11.9 10*3/uL — ABNORMAL HIGH (ref 1.7–7.7)
Neutrophils Relative %: 75 %
Platelets: 199 10*3/uL (ref 150–400)
RBC: 3.73 MIL/uL — ABNORMAL LOW (ref 4.22–5.81)
RDW: 14.9 % (ref 11.5–15.5)
WBC: 15.5 10*3/uL — ABNORMAL HIGH (ref 4.0–10.5)
nRBC: 0 % (ref 0.0–0.2)

## 2018-05-15 LAB — BASIC METABOLIC PANEL
Anion gap: 11 (ref 5–15)
BUN: 50 mg/dL — ABNORMAL HIGH (ref 8–23)
CO2: 30 mmol/L (ref 22–32)
CREATININE: 2.9 mg/dL — AB (ref 0.61–1.24)
Calcium: 8.6 mg/dL — ABNORMAL LOW (ref 8.9–10.3)
Chloride: 97 mmol/L — ABNORMAL LOW (ref 98–111)
GFR calc Af Amer: 22 mL/min — ABNORMAL LOW (ref >60)
GFR calc non Af Amer: 19 mL/min — ABNORMAL LOW (ref >60)
Glucose, Bld: 194 mg/dL — ABNORMAL HIGH (ref 70–99)
Potassium: 4.9 mmol/L (ref 3.5–5.1)
Sodium: 138 mmol/L (ref 135–145)

## 2018-05-15 LAB — GLUCOSE, CAPILLARY
Glucose-Capillary: 189 mg/dL — ABNORMAL HIGH (ref 70–99)
Glucose-Capillary: 229 mg/dL — ABNORMAL HIGH (ref 70–99)
Glucose-Capillary: 274 mg/dL — ABNORMAL HIGH (ref 70–99)
Glucose-Capillary: 289 mg/dL — ABNORMAL HIGH (ref 70–99)

## 2018-05-15 MED ORDER — SODIUM CHLORIDE 0.9 % IV SOLN
INTRAVENOUS | Status: DC
  Administered 2018-05-15 – 2018-05-18 (×6): via INTRAVENOUS

## 2018-05-15 MED ORDER — HYDROCORTISONE 1 % EX CREA
TOPICAL_CREAM | Freq: Three times a day (TID) | CUTANEOUS | Status: DC
  Administered 2018-05-15 – 2018-05-20 (×13): via TOPICAL
  Administered 2018-05-20: 1 via TOPICAL
  Administered 2018-05-21 – 2018-05-22 (×6): via TOPICAL
  Administered 2018-05-23: 1 via TOPICAL
  Administered 2018-05-23 – 2018-05-27 (×10): via TOPICAL
  Filled 2018-05-15: qty 28

## 2018-05-15 NOTE — Evaluation (Addendum)
Occupational Therapy Evaluation Patient Details Name: Justin Leach MRN: [id number] DOB: 1932/01/30 Today's Date: 05/15/2018    History of Present Illness Patient is a 84 year old male with past medical history of hypertension, hyperlipidemia, diabetes mellitus, GERD, diastolic CHF, CKD stage III, coronary artery disease, paroxysmal A. fib on Coumadin, interstitial lung disease who presented with a fall at home.  X-ray of the left shoulder showed comminuted left humeral surgical neck fracture.  CT head showed bilateral subdural hematoma without midline shift.  INR found to be 3.1 now s/p Vit K and awaiting correction of INR prior to surgical fixation of shoulder.    Clinical Impression   PTA patient independent and driving.  Currently admitted for above and limited by problem list below, including pain, decreased activity tolerance, impaired balance, fear of falling and precautions to dominant L UE. Requires max assist (+2 to return to supine) for bed mobility, min assist for grooming, max assist for UB ADLs, and total assist for LB ADLs.  Patient able to maintain sitting balance EOB with min guard to close supervision, but unable to attempt transfers today due to tolerance and pain.  Patient reports history of vertigo, voicing "those crystals in my ears are messed up", and reports increased dizziness and nausea when returned to supine.  Hyper-focused on pain and presents with significant anxiety to mobility.  Patient will benefit from continued OT services while admitted and after dc at SNF level in order to optimize independence/safety and return to PLOF.     Follow Up Recommendations  SNF;Supervision/Assistance - 24 hour    Equipment Recommendations  Other (comment)(TBD at next venue of care)    Recommendations for Other Services       Precautions / Restrictions Precautions Precautions: Fall Precaution Comments: no ROM at shoulder, progress towards AROM at elbow as tolerated  Required  Braces or Orthoses: Sling Restrictions Weight Bearing Restrictions: Yes LUE Weight Bearing: Non weight bearing      Mobility Bed Mobility Overal bed mobility: Needs Assistance Bed Mobility: Supine to Sit;Sit to Supine     Supine to sit: HOB elevated;Max assist Sit to supine: Max assist;+2 for physical assistance   General bed mobility comments: supine to EOB with assist to scoot hips towards EOB, HOB elevated with support to ascend trunk; sit to supine to max assist for trunk and LB support; +2 assist to boost up in bed  Transfers                 General transfer comment: deferred due to safety and pt tolerance    Balance Overall balance assessment: Needs assistance Sitting-balance support: No upper extremity supported;Feet supported Sitting balance-Leahy Scale: Fair Sitting balance - Comments: sat EOB with min guard to close supervision, inital R lateral lean                                   ADL either performed or assessed with clinical judgement   ADL Overall ADL's : Needs assistance/impaired     Grooming: Moderate assistance;Sitting   Upper Body Bathing: Maximal assistance;Sitting   Lower Body Bathing: Maximal assistance;Bed level   Upper Body Dressing : Maximal assistance;Sitting   Lower Body Dressing: Total assistance;Bed level     Toilet Transfer Details (indicate cue type and reason): deferred due to safety         Functional mobility during ADLs: Maximal assistance General ADL Comments: limited eval due to pain  and anxiety; bed mobility and sitting EOB only     Vision Baseline Vision/History: Wears glasses Wears Glasses: At all times Additional Comments: further assessment may be warrented     Perception     Praxis      Pertinent Vitals/Pain Pain Assessment: Faces Faces Pain Scale: Hurts worst Pain Location: L shoulder and generalized with mobility Pain Descriptors / Indicators: Aching;Sore;Guarding;Moaning Pain  Intervention(s): Monitored during session;Repositioned     Hand Dominance Left   Extremity/Trunk Assessment Upper Extremity Assessment Upper Extremity Assessment: LUE deficits/detail;RUE deficits/detail RUE Deficits / Details: WFL  LUE Deficits / Details: immobilized in sling, sensation intact, gross hand flexion/extension WFL; edema LUE: Unable to fully assess due to pain;Unable to fully assess due to immobilization LUE Sensation: WNL LUE Coordination: decreased gross motor;decreased fine motor   Lower Extremity Assessment Lower Extremity Assessment: Defer to PT evaluation   Cervical / Trunk Assessment Cervical / Trunk Assessment: Kyphotic   Communication Communication Communication: HOH   Cognition Arousal/Alertness: Awake/alert Behavior During Therapy: Anxious Overall Cognitive Status: Impaired/Different from baseline Area of Impairment: Following commands;Awareness;Problem solving;Attention                   Current Attention Level: Sustained   Following Commands: Follows one step commands with increased time   Awareness: Emergent Problem Solving: Difficulty sequencing;Requires verbal cues General Comments: hyperfocused on pain, anxious; requires increased time for all tasks    General Comments  VSS. oxgyen saturations maintain throughout session on supplemental oxgyen via [place]    Exercises Exercises: Other exercises Other Exercises Other Exercises: L hand gross flexion/extension x 5 reps; educated on importance of ROM and to complete hand ROM every hour   Shoulder Instructions      Home Living Family/patient expects to be discharged to:: Private residence Living Arrangements: Alone Available Help at Discharge: Family Type of Home: House Home Access: [place]: One level     Bathroom Shower/Tub: Teacher, early years/pre: [place]: Mining engineer - 2 wheels;Shower seat          Prior  Functioning/Environment Level of Independence: Independent with assistive device(s)        Comments: uses cane for mobility, independent, driving, IADLs        OT Problem List: Decreased strength;Decreased activity tolerance;Impaired balance (sitting and/or standing);Decreased coordination;Decreased cognition;Decreased range of motion;Decreased safety awareness;Decreased knowledge of use of DME or AE;Decreased knowledge of precautions;Cardiopulmonary status limiting activity;Pain;Increased edema;Impaired UE functional use      OT Treatment/Interventions: Self-care/ADL training;Therapeutic exercise;DME and/or AE instruction;Therapeutic activities;Cognitive remediation/compensation;Balance training;Patient/family education    OT Goals(Current goals can be found in the care plan section) Acute Rehab OT Goals Patient Stated Goal: to return to independent, knows he will need rehab OT Goal Formulation: With patient Time For Goal Achievement: 05/29/18 Potential to Achieve Goals: Good  OT Frequency: Min 3X/week   Barriers to D/C:            Co-evaluation              AM-PAC OT "6 Clicks" Daily Activity     Outcome Measure Help from another person eating meals?: A Little Help from another person taking care of personal grooming?: A Little Help from another person toileting, which includes using toliet, bedpan, or urinal?: Total Help from another person bathing (including washing, rinsing, drying)?: A Lot Help from another person to put on and taking off regular upper body clothing?:  A Lot Help from another person to put on and taking off regular lower body clothing?: Total 6 Click Score: 12   End of Session Equipment Utilized During Treatment: Other (comment);Oxygen(sling) Nurse Communication: Mobility status;Other (comment)(pain and dizziness)  Activity Tolerance: Patient tolerated treatment well Patient left: in bed;with call bell/phone within reach;with bed alarm set;with  family/visitor present  OT Visit Diagnosis: Other abnormalities of gait and mobility (R26.89);Pain Pain - Right/Left: Left Pain - part of body: Shoulder                Time: 7981-0254 OT Time Calculation (min): 37 min Charges:  OT General Charges $OT Visit: 1 Visit OT Evaluation $OT Eval High Complexity: 1 High OT Treatments $Self Care/Home Management : 8-22 mins  Delight Stare, OT Acute Rehabilitation Services Pager [phone] Office [phone]   Delight Stare 05/15/2018, 3:22 PM

## 2018-05-15 NOTE — Progress Notes (Addendum)
PROGRESS NOTE    Justin Leach  UVO:[id number] DOB: [birth year] DOA: 05/13/2018 PCP: Lavone Orn, MD   Brief Narrative: Patient is a 84 year old male with past medical history of hypertension, hyperlipidemia, diabetes mellitus, GERD, diastolic CHF, CKD stage III, coronary artery disease, paroxysmal A. fib on Coumadin, interstitial lung disease who presented with a fall at home.  He striked his head against the side of the table.  He sustained a skin laceration in the left eyebrow and also developed severe pain over the left shoulder and also developed headache.  When he presented, he was found to have INR of 3.1.  X-ray of the left shoulder showed comminuted left humeral surgical neck fracture.  CT head showed bilateral subdural hematoma without midline shift.  He was given vitamin K 10 mg and Kcentra in the emergency department.  Neurosurgery consulted. There is no plan for intervention by neurosurgery.  Orthopedics is planning to operate on his left humeral fracture and that will happen on Tuesday.  Patient also seen by physical therapy and recommended skilled facility.  Social worker consulted.   Assessment & Plan:   Principal Problem:   SDH (subdural hematoma) (HCC) Active Problems:   Hyperlipidemia   Coronary artery calcification seen on CT scan   Chronic diastolic CHF (congestive heart failure) (HCC)   PAF (paroxysmal atrial fibrillation) (HCC)   CKD (chronic kidney disease), stage III (HCC)   Type II diabetes mellitus with renal manifestations (HCC)   Essential hypertension   GERD (gastroesophageal reflux disease)   Fall   Closed comminuted left humeral fracture   Hyperkalemia   Leukocytosis  Subdural hematoma: CT head on presentation showed acute subdural hematoma left greater than right without shift or mass-effect.  Neurosurgery consulted.Follow up   CT head  showed improvement in the subdural hematoma. Neurosurgery not planning for intervention.  He will follow-up with  Dr. Mallie Mussel as an outpatient in 3 weeks.  Supratherapeutic INR :INR found to be supratherapeutic on presentation.  He was given vitamin K and Kcentra.  INR reversed. Coumadin  will be held most likely permanently.  Fall: Mechanical fall.  Laceration on the left eyebrow and sustained fracture on the surgical neck of left humerus.  PT/OT evaluation done.  Closed comminuted left humeral fracture: Orthopedics following.  Continue pain management.  Planning for reverse shoulder arthroplasty on coming tuesday.  Continue pain management.  Continue sling, nonweightbearing for now.  AKI on CKD stage III: Presented with creatinine of 1.66.  Baseline creatinine ranges from 1.7-2.  Creatinine bumped up to 2.9 today.  Checked bladder scan with no significant residual. He has been voiding.  Will start on IV fluids.  Check BMP tomorrow morning.  Hyperlipidemia: Continue Lipitor  Coronary artery calcification seen on the CT scan: Continue Lipitor and metoprolol.  Chronic diastolic CHF: 2D echo on 7/42/5956 showed ejection fraction of 65%.  No significant peripheral edema.  Currently CHF compensated.  No pulmonary edema on the chest x-ray.  Paroxysmal A. LOV:[id number] Score is 6. Will need to hold off coumadin now. Heart rate is well controlled.Continue metoprolol.  Diabetes type 2: Hemoglobin A1c of 6.3 as per 10/31/15.  Well controlled.  Continue Lantus and sliding sensorial.  Hypertension: Lisinopril held due to AKI.  Continue to monitor blood pressure  Hyperkalemia:Resolved  Leukocytosis: Most likely reactive.  Continue to monitor the trend.Improving.          DVT prophylaxis:SCD Code Status: Full Family Communication: None present at the bedside Disposition Plan: SNF after orthopedic surgery  on left humerus fracture  Consultants: Orthopedics, neurosurgery  Procedures: None  Antimicrobials: None  Subjective: Patient seen and examined the bedside this morning.  Remains hemodynamically  stable.  Denies any headache.  Complains of pain /itching on the left arm.  Alert and oriented.  Objective: Vitals:   05/15/18 0000 05/15/18 0400 05/15/18 0836 05/15/18 1100  BP: (!) 134/37  (!) 142/52 (!) 155/47  Pulse: 65  (!) 57 (!) 59  Resp:   14 14  Temp: 97.6 F (36.4 C) 98.5 F (36.9 C) 99 F (37.2 C) 97.7 F (36.5 C)  TempSrc: Oral Oral Oral Oral  SpO2: 100% 100% 98% 97%  Weight:      Height:        Intake/Output Summary (Last 24 hours) at 05/15/2018 1344 Last data filed at 05/15/2018 1013 Gross per 24 hour  Intake 120 ml  Output 275 ml  Net -155 ml   Filed Weights   05/13/18 1905  Weight: 72.6 kg    Examination:  General exam: Not in distress,obese HEENT:PERRL,Oral mucosa moist, Ear/Nose normal on gross exam, bruises on periorbital region on the left side. Respiratory system: Bilateral equal air entry, normal vesicular breath sounds, no wheezes or crackles  Cardiovascular system: S1 & S2 heard, RRR. No JVD, murmurs, rubs, gallops or clicks. No pedal edema. Gastrointestinal system: Abdomen is nondistended, soft and nontender. No organomegaly or masses felt. Normal bowel sounds heard. Central nervous system: Alert and oriented. No focal neurological deficits. Extremities: No edema, no clubbing ,no cyanosis, distal peripheral pulses palpable. Severe Tenderness of the left arm.  Left arm in sling. Skin: No rashes, lesions or ulcers,no icterus ,no pallor MSK: Normal muscle bulk,tone ,power Psychiatry: Judgement and insight appear normal. Mood & affect appropriate.     Data Reviewed: I have personally reviewed following labs and imaging studies  CBC: Recent Labs  Lab 05/13/18 1937 05/15/18 0342  WBC 19.0* 15.5*  NEUTROABS 15.8* 11.9*  HGB 14.6 11.3*  HCT 46.5 36.4*  MCV 96.1 97.6  PLT 243 742   Basic Metabolic Panel: Recent Labs  Lab 05/13/18 1937 05/15/18 0342  NA 140 138  K 5.5* 4.9  CL 98 97*  CO2 32 30  GLUCOSE 186* 194*  BUN 31* 50*    CREATININE 1.66* 2.90*  CALCIUM 9.6 8.6*   GFR: Estimated Creatinine Clearance: 18.3 mL/min (A) (by C-G formula based on SCr of 2.9 mg/dL (H)). Liver Function Tests: Recent Labs  Lab 05/13/18 1937  AST 32  ALT 33  ALKPHOS 98  BILITOT 0.9  PROT 6.1*  ALBUMIN 3.4*   No results for input(s): LIPASE, AMYLASE in the last 168 hours. No results for input(s): AMMONIA in the last 168 hours. Coagulation Profile: Recent Labs  Lab 05/13/18 1937 05/13/18 2246 05/14/18 0011  INR 3.17 1.16 1.13   Cardiac Enzymes: Recent Labs  Lab 05/13/18 1937 05/13/18 2246  CKTOTAL  --  215  TROPONINI <0.03  --    BNP (last 3 results) No results for input(s): PROBNP in the last 8760 hours. HbA1C: No results for input(s): HGBA1C in the last 72 hours. CBG: Recent Labs  Lab 05/14/18 1236 05/14/18 1705 05/14/18 2156 05/15/18 0850 05/15/18 1203  GLUCAP 207* 241* 163* 189* 229*   Lipid Profile: No results for input(s): CHOL, HDL, LDLCALC, TRIG, CHOLHDL, LDLDIRECT in the last 72 hours. Thyroid Function Tests: No results for input(s): TSH, T4TOTAL, FREET4, T3FREE, THYROIDAB in the last 72 hours. Anemia Panel: No results for input(s): VITAMINB12, FOLATE, FERRITIN,  TIBC, IRON, RETICCTPCT in the last 72 hours. Sepsis Labs: No results for input(s): PROCALCITON, LATICACIDVEN in the last 168 hours.  Recent Results (from the past 240 hour(s))  Culture, blood (Routine X 2) w Reflex to ID Panel     Status: None (Preliminary result)   Collection Time: 05/13/18 10:20 PM  Result Value Ref Range Status   Specimen Description BLOOD RIGHT HAND  Final   Special Requests   Final    BOTTLES DRAWN AEROBIC AND ANAEROBIC Blood Culture adequate volume   Culture   Final    NO GROWTH 2 DAYS Performed at [facility], [address], [place], [place] [address]    Report Status PENDING  Incomplete  Culture, blood (Routine X 2) w Reflex to ID Panel     Status: None (Preliminary result)   Collection Time:  05/13/18 10:50 PM  Result Value Ref Range Status   Specimen Description BLOOD RIGHT HAND  Final   Special Requests   Final    BOTTLES DRAWN AEROBIC AND ANAEROBIC Blood Culture adequate volume   Culture   Final    NO GROWTH 2 DAYS Performed at [facility], [place] [address] [place], [place] [address]    Report Status PENDING  Incomplete  MRSA PCR Screening     Status: Abnormal   Collection Time: 05/14/18  1:00 AM  Result Value Ref Range Status   MRSA by PCR POSITIVE (A) NEGATIVE Final    Comment:        The GeneXpert MRSA Assay (FDA approved for NASAL specimens only), is one component of a comprehensive MRSA colonization surveillance program. It is not intended to diagnose MRSA infection nor to guide or monitor treatment for MRSA infections. RESULT CALLED TO, READ BACK BY AND VERIFIED WITH: Cain Saupe RN 05/14/18 0319 JDW Performed at [facility], [address] [place], [place] [address]          Radiology Studies: Dg Chest 1 View  Result Date: 05/13/2018 CLINICAL DATA:  Recent fall with left shoulder fracture, initial encounter EXAM: CHEST  1 VIEW COMPARISON:  10/30/2015 FINDINGS: Cardiac shadow is enlarged but stable. Aortic calcifications are again seen. Lungs are well aerated bilaterally with minimal left basilar scarring. No focal infiltrate or effusion is seen. No acute bony abnormality is noted aside from the known left humeral fracture. IMPRESSION: Left humeral fracture. Left basilar scarring. Electronically Signed   By: Inez Catalina M.D.   On: 05/13/2018 20:45   Ct Head Wo Contrast  Result Date: 05/14/2018 CLINICAL DATA:  Follow up subdural hematomas. EXAM: CT HEAD WITHOUT CONTRAST TECHNIQUE: Contiguous axial images were obtained from the base of the skull through the vertex without intravenous contrast. COMPARISON:  CT HEAD May 13, 2018 FINDINGS: BRAIN: No intraparenchymal hemorrhage, mass effect nor midline shift. Moderate to severe parenchymal  brain volume loss, mild sulcal effacement at the convexities and narrowed callosum angle. Faint supratentorial white matter hypodensities less than expected for patient's age, though non-specific are most compatible with chronic small vessel ischemic disease. No acute large vascular territory infarcts. Decreased 6 mm LEFT holo hemispheric and 2 mm RIGHT fronto tentorial dense subdural hematomas. Basal cisterns are patent. VASCULAR: Moderate calcific atherosclerosis of the carotid siphons. SKULL: No skull fracture. Focal LEFT frontal scalp soft tissue swelling, possible contusion. Multifocal scalp scarring. SINUSES/ORBITS: Trace paranasal sinus mucosal thickening. Mastoid air cells are well aerated.The included ocular globes and orbital contents are non-suspicious. Status post bilateral ocular lens implants.  OTHER: None. IMPRESSION: 1. Decreased size of bilateral acute subdural hematomas measuring to 6 mm on the LEFT. No midline shift. 2. Image findings of chronic communicating hydrocephalus. Electronically Signed   By: Elon Alas M.D.   On: 05/14/2018 05:29   Ct Head Wo Contrast  Result Date: 05/13/2018 CLINICAL DATA:  Patient fell at home hitting head on kitchen table. EXAM: CT HEAD WITHOUT CONTRAST TECHNIQUE: Contiguous axial images were obtained from the base of the skull through the vertex without intravenous contrast. COMPARISON:  03/23/2006 FINDINGS: Brain: Acute left subdural hematoma measuring up to 10 mm in thickness without subfalcine shift is identified overlying the convexity of the left frontal, temporal and parietal lobes. Additional smaller right-sided subdural hematoma is identified overlying the right temporal lobe measuring up to 3 mm in thickness. No acute intraparenchymal hemorrhage. Chronic microvascular ischemia with atrophy is noted. Vascular: No hyperdense vessel sign. No acute Skull: No acute skull fracture. No suspicious osseous lesions. Sinuses/Orbits: Intact Other: Small 7 mm  scalp nodule overlying the right parietal skull minimally larger than prior. IMPRESSION: 1. Acute, left greater than right subdural hematomas measuring up to 10 mm in thickness on the left overlying the left frontal, temporal and parietal lobes and up to 3 mm overlying the right temporal lobe. No midline shift. These results were called by telephone at the time of interpretation on 05/13/2018 at 8:30 pm to PA [facility] , who verbally acknowledged these results. 2. Chronic stable atrophy and microvascular ischemia. 3. No acute osseous abnormality of the bony calvarium. Electronically Signed   By: Ashley Royalty M.D.   On: 05/13/2018 20:31   Ct Shoulder Left Wo Contrast  Result Date: 05/13/2018 CLINICAL DATA:  Encounter for left shoulder fracture after fall today. EXAM: CT OF THE UPPER LEFT EXTREMITY WITHOUT CONTRAST TECHNIQUE: Multidetector CT imaging of the upper left extremity was performed according to the standard protocol. COMPARISON:  Same day radiographs of the left shoulder. FINDINGS: Bones/Joint/Cartilage There is an acute comminuted proximal left humerus fracture involving the greater tuberosity and surgical neck. One shaft with anterior displacement of the humeral shaft relative to the humeral head is identified. The humeral head maintains its articulation the glenoid. The [facility] joint is likewise maintained. The left scapula and included left ribs appear intact. Ligaments Suboptimally assessed by CT. Muscles and Tendons Intramuscular hemorrhage or atrophy. Mild posttraumatic soft tissue induration left axilla. No significant joint effusion. Soft tissues Mild soft tissue contusion overlying the deltoid. IMPRESSION: Neer category 2 part fracture of the proximal left humerus with one shaft width anterior displacement of the humeral shaft relative to the humeral head. Nondisplaced fracture undermining the greater tuberosity is identified. No joint dislocation is seen. Electronically Signed   By: Ashley Royalty  M.D.   On: 05/13/2018 22:56   Dg Shoulder Left  Result Date: 05/13/2018 CLINICAL DATA:  Recent fall with shoulder pain, initial encounter EXAM: LEFT SHOULDER - 2+ VIEW COMPARISON:  None. FINDINGS: Transverse fracture is noted through the surgical neck of the humerus with displacement of the humerus somewhat medially with respect to the humeral head. Fracture lines extend into the humeral head with mild displacement of the greater tuberosity. No other focal abnormality is seen. IMPRESSION: Comminuted fracture of the proximal left humerus. The fracture involves primarily the surgical neck. Electronically Signed   By: Inez Catalina M.D.   On: 05/13/2018 20:44        Scheduled Meds: . atorvastatin  40 mg Oral QPM  . Chlorhexidine Gluconate  Cloth  6 each Topical [id number]  . cholecalciferol  1,000 Units Oral Daily  . dextrose  50 mL Intravenous Once  . insulin aspart  0-9 Units Subcutaneous TID WC  . insulin detemir  20 Units Subcutaneous QHS  . insulin detemir  30 Units Subcutaneous Daily  . metoprolol tartrate  50 mg Oral BID  . mupirocin ointment  1 application Nasal BID  . pantoprazole  40 mg Oral Daily  . pneumococcal 23 valent vaccine  0.5 mL Intramuscular Tomorrow-1000  . vitamin B-12  1,000 mcg Oral Daily   Continuous Infusions: . sodium chloride 100 mL/hr at 05/15/18 1219     LOS: 2 days    Time spent: 35 mins.More than 50% of that time was spent in counseling and/or coordination of care.      Shelly Coss, MD [facility] Pager [phone]  If 7PM-7AM, please contact night-coverage [email or web address] Password [email or web address] 05/15/2018, 1:44 PM

## 2018-05-16 DIAGNOSIS — S01112A Laceration without foreign body of left eyelid and periocular area, initial encounter: Secondary | ICD-10-CM

## 2018-05-16 DIAGNOSIS — I480 Paroxysmal atrial fibrillation: Secondary | ICD-10-CM

## 2018-05-16 DIAGNOSIS — I2510 Atherosclerotic heart disease of native coronary artery without angina pectoris: Secondary | ICD-10-CM

## 2018-05-16 LAB — GLUCOSE, CAPILLARY
GLUCOSE-CAPILLARY: 126 mg/dL — AB (ref 70–99)
Glucose-Capillary: 119 mg/dL — ABNORMAL HIGH (ref 70–99)
Glucose-Capillary: 90 mg/dL (ref 70–99)
Glucose-Capillary: 123 mg/dL — ABNORMAL HIGH (ref 70–99)

## 2018-05-16 LAB — BASIC METABOLIC PANEL
Anion gap: 10 (ref 5–15)
BUN: 52 mg/dL — ABNORMAL HIGH (ref 8–23)
CHLORIDE: 99 mmol/L (ref 98–111)
CO2: 27 mmol/L (ref 22–32)
CREATININE: 2.07 mg/dL — AB (ref 0.61–1.24)
Calcium: 8.3 mg/dL — ABNORMAL LOW (ref 8.9–10.3)
GFR calc Af Amer: 33 mL/min — ABNORMAL LOW (ref >60)
GFR calc non Af Amer: 29 mL/min — ABNORMAL LOW (ref >60)
Glucose, Bld: 189 mg/dL — ABNORMAL HIGH (ref 70–99)
Potassium: 4.4 mmol/L (ref 3.5–5.1)
Sodium: 136 mmol/L (ref 135–145)

## 2018-05-16 LAB — CBC WITH DIFFERENTIAL/PLATELET
Abs Immature Granulocytes: 0.09 10*3/uL — ABNORMAL HIGH (ref 0.00–0.07)
Basophils Absolute: 0.1 10*3/uL (ref 0.0–0.1)
Basophils Relative: 1 %
EOS PCT: 2 %
Eosinophils Absolute: 0.3 10*3/uL (ref 0.0–0.5)
HEMATOCRIT: 37.3 % — AB (ref 39.0–52.0)
HEMOGLOBIN: 11.5 g/dL — AB (ref 13.0–17.0)
Immature Granulocytes: 1 %
LYMPHS ABS: 1 10*3/uL (ref 0.7–4.0)
Lymphocytes Relative: 7 %
MCH: 29.9 pg (ref 26.0–34.0)
MCHC: 30.8 g/dL (ref 30.0–36.0)
MCV: 96.9 fL (ref 80.0–100.0)
Monocytes Absolute: 2 10*3/uL — ABNORMAL HIGH (ref 0.1–1.0)
Monocytes Relative: 14 %
NRBC: 0 % (ref 0.0–0.2)
Neutro Abs: 10.7 10*3/uL — ABNORMAL HIGH (ref 1.7–7.7)
Neutrophils Relative %: 75 %
Platelets: 177 10*3/uL (ref 150–400)
RBC: 3.85 MIL/uL — ABNORMAL LOW (ref 4.22–5.81)
RDW: 14.7 % (ref 11.5–15.5)
WBC: 14.1 10*3/uL — ABNORMAL HIGH (ref 4.0–10.5)

## 2018-05-16 MED ORDER — HYDRALAZINE HCL 25 MG PO TABS
25.0000 mg | ORAL_TABLET | Freq: Four times a day (QID) | ORAL | Status: DC | PRN
  Administered 2018-05-16 – 2018-05-23 (×2): 25 mg via ORAL
  Filled 2018-05-16 (×2): qty 1

## 2018-05-16 NOTE — Progress Notes (Signed)
PT has called this nurse in to his room several times this shift because he is itchy. Hydrocortisone cream has been applied as ordered with little to no relief. This nurse suggested a bath would help relieve the itching. He initially agreed to this. He then started reporting increased pain which pain meds were administered as ordered. Pt reported that if we were to give the bath the whole floor would be woke up by his yelling. Two more attempts were made to get pt to agree to a bath and he continued to report increased pain. Pt was not bathed this shift. [employer] RN, 05/16/18, [phone]

## 2018-05-16 NOTE — Progress Notes (Signed)
[facility]  PROGRESS NOTE  Justin Leach QMV:[id number] DOB: Jan 10, 1932 DOA: 05/13/2018 PCP: Lavone Orn, MD   Brief HPI:   84 year old male with a history of hypertension, hyperlipidemia, diabetes mellitus, GERD, diastolic CHF, CKD stage III, chronic artery disease, paroxysmal atrial fibrillation on Coumadin, interstitial lung disease who came to the ED with a fall at home.  He struck his head at the table.  Sustained skin laceration of the left eyebrow.  Was found to have elevated INR of 3.1.  X-ray of left shoulder showed comminuted left humeral surgical neck fracture.  CT head showed bilateral subdural hematoma without midline shift.  He was given vitamin K 10 mg and Kcentra in the ED.  Neurosurgery was consulted and recommended no intervention at this time.  Orthopedics has plan to operate for his left lumbar fracture on Tuesday.    Subjective   Patient seen and examined, complains of mild shoulder pain.  No chest pain or shortness of breath.   Assessment/Plan:    1. Subdural hematoma-CT head showed acute subdural hematoma  Left, 10 mm overlying the left frontal, temporal and parietal lobes greater than right 3 mm overlying right temporal lobe without mass effect.  No midline shift.  Neurosurgery was consulted.  No intervention recommended.  Repeat head CT on 05/14/2018 showed decreased size of bilateral acute subdural hematomas measuring up to 6 mm on the left.  2. Closed comminuted left humeral fracture-orthopedics following, plan for reverse shoulder arthroplasty on Tuesday.  Continue pain management, continue sling, nonweightbearing for now.  3. Acute kidney injury on CKD stage III-patient presented with creatinine of 1.66.  Baseline creatinine around 1.7-2.  Creatinine bumped to 2.9 yesterday.  Bladder scan showed no significant residual.  Started on IV normal saline.  Today creatinine is improved to 2.07.  Follow BMP in a.m.  4. Hyperlipidemia-continue  Lipitor  5. Coronary artery calcification seen on CT scan-continue Lipitor metoprolol  6. Chronic diastolic MWU-1L echo on 2/44/0102 showed EF 65%.  No significant peripheral edema, currently CHF is compensated.  7. Paroxysmal atrial fibrillation- CHA2DS2VASc score is 6.  Coumadin is on hold.  Coumadin will be held permanently moved around hospital. [facility] scheduled as needed schedule eart rate is well controlled.  Continue metoprolol  8. Diabetes mellitus type 2-hemoglobin A1c 6.3.  Continue Lantus and sliding scale insulin with NovoLog.  9. Hypertension-blood pressure is mildly elevated, lisinopril is on hold due to AKI.  Will start hydralazine PRN.  10. Hyperkalemia-resolved    CBG: Recent Labs  Lab 05/15/18 1655 05/15/18 2144 05/16/18 0758 05/16/18 1213 05/16/18 1648  GLUCAP 274* 289* 119* 126* 90    CBC: Recent Labs  Lab 05/13/18 1937 05/15/18 0342 05/16/18 0503  WBC 19.0* 15.5* 14.1*  NEUTROABS 15.8* 11.9* 10.7*  HGB 14.6 11.3* 11.5*  HCT 46.5 36.4* 37.3*  MCV 96.1 97.6 96.9  PLT 243 199 725    Basic Metabolic Panel: Recent Labs  Lab 05/13/18 1937 05/15/18 0342 05/16/18 0503  NA 140 138 136  K 5.5* 4.9 4.4  CL 98 97* 99  CO2 32 30 27  GLUCOSE 186* 194* 189*  BUN 31* 50* 52*  CREATININE 1.66* 2.90* 2.07*  CALCIUM 9.6 8.6* 8.3*     DVT prophylaxis: SCDs  Code Status: Full code  Family Communication: No family at bedside  Disposition Plan: Skilled nursing facility   Consultants:  Orthopedics  Neurosurgery  Procedures:  None   Antibiotics:   Anti-infectives (From admission, onward)   None  Objective   Vitals:   05/16/18 0400 05/16/18 0802 05/16/18 1204 05/16/18 1600  BP:  118/81 (!) 151/63 (!) 161/56  Pulse:  66 (!) 52 86  Resp:  18 12 15   Temp: 98.3 F (36.8 C) 98.6 F (37 C) 98.6 F (37 C) 98.4 F (36.9 C)  TempSrc: Oral Oral Oral Oral  SpO2:  97% 94% (!) 89%  Weight:      Height:        Intake/Output  Summary (Last 24 hours) at 05/16/2018 4765 Last data filed at 05/16/2018 1733 Gross per 24 hour  Intake 2765.03 ml  Output 625 ml  Net 2140.03 ml   Filed Weights   05/13/18 1905  Weight: 72.6 kg     Physical Examination:    General: Appears in no acute distress  Cardiovascular: S1-S2, regular, no murmur auscultated  Respiratory: Clear to auscultation bilaterally, no wheezing or crackles  Abdomen: Soft, nontender, no organomegaly  Extremities: Left shoulder in sling  Neurologic: Alert, oriented x3, no focal deficit noted     Data Reviewed: I have personally reviewed following labs and imaging studies   Recent Results (from the past 240 hour(s))  Culture, blood (Routine X 2) w Reflex to ID Panel     Status: None (Preliminary result)   Collection Time: 05/13/18 10:20 PM  Result Value Ref Range Status   Specimen Description BLOOD RIGHT HAND  Final   Special Requests   Final    BOTTLES DRAWN AEROBIC AND ANAEROBIC Blood Culture adequate volume   Culture   Final    NO GROWTH 3 DAYS Performed at [facility], [address] [place], [place] [address]    Report Status PENDING  Incomplete  Culture, blood (Routine X 2) w Reflex to ID Panel     Status: None (Preliminary result)   Collection Time: 05/13/18 10:50 PM  Result Value Ref Range Status   Specimen Description BLOOD RIGHT HAND  Final   Special Requests   Final    BOTTLES DRAWN AEROBIC AND ANAEROBIC Blood Culture adequate volume   Culture   Final    NO GROWTH 3 DAYS Performed at [facility], [place] [address] [place], [place] [address]    Report Status PENDING  Incomplete  MRSA PCR Screening     Status: Abnormal   Collection Time: 05/14/18  1:00 AM  Result Value Ref Range Status   MRSA by PCR POSITIVE (A) NEGATIVE Final    Comment:        The GeneXpert MRSA Assay (FDA approved for NASAL specimens only), is one component of a comprehensive MRSA colonization surveillance program. It is  not intended to diagnose MRSA infection nor to guide or monitor treatment for MRSA infections. RESULT CALLED TO, READ BACK BY AND VERIFIED WITH: Cain Saupe RN 05/14/18 0319 JDW Performed at [facility], [address], [place], [place] [address]      Liver Function Tests: Recent Labs  Lab 05/13/18 1937  AST 32  ALT 33  ALKPHOS 98  BILITOT 0.9  PROT 6.1*  ALBUMIN 3.4*   No results for input(s): LIPASE, AMYLASE in the last 168 hours. No results for input(s): AMMONIA in the last 168 hours.  Cardiac Enzymes: Recent Labs  Lab 05/13/18 1937 05/13/18 2246  CKTOTAL  --  215  TROPONINI <0.03  --    BNP (last 3 results) Recent Labs    05/13/18 2213  BNP 129.8*    ProBNP (last 3 results)  No results for input(s): PROBNP in the last 8760 hours.    Studies: No results found.  Scheduled Meds: . atorvastatin  40 mg Oral QPM  . Chlorhexidine Gluconate Cloth  6 each Topical Q0600  . cholecalciferol  1,000 Units Oral Daily  . dextrose  50 mL Intravenous Once  . hydrocortisone cream   Topical TID  . insulin aspart  0-9 Units Subcutaneous TID WC  . insulin detemir  20 Units Subcutaneous QHS  . insulin detemir  30 Units Subcutaneous Daily  . metoprolol tartrate  50 mg Oral BID  . mupirocin ointment  1 application Nasal BID  . pantoprazole  40 mg Oral Daily  . vitamin B-12  1,000 mcg Oral Daily    Admission status: Observation/Inpatient: Based on patients clinical presentation and evaluation of above clinical data, I have made determination that patient meets Inpatient criteria at this time.  Time spent: 60 minutes  [facility] Pager [phone]. If 7PM-7AM, please contact night-coverage at [email or web address], Office  [phone]  password [email or web address]  05/16/2018, 6:38 PM  LOS: 3 days

## 2018-05-16 NOTE — Clinical Social Work Note (Signed)
Clinical Social Work Assessment  Patient Details  Name: Justin Leach MRN: [id number] Date of Birth: 1932-04-06  Date of referral:  05/16/18               Reason for consult:  Facility Placement, Discharge Planning                Permission sought to share information with:  Facility Sport and exercise psychologist, Family Supports Permission granted to share information::  Yes, Verbal Permission Granted  Name::     Justin Leach  Agency::  SNF's  Relationship::  Wife  Contact Information:  [phone]  Housing/Transportation Living arrangements for the past 2 months:  [place] of Information:  Patient, Medical Team, Spouse Patient Interpreter Needed:  None Criminal Activity/Legal Involvement Pertinent to Current Situation/Hospitalization:  No - Comment as needed Significant Relationships:  Spouse Lives with:  Spouse Do you feel safe going back to the place where you live?  Yes Need for family participation in patient care:  Yes (Comment)  Care giving concerns:  PT recommending SNF once medically stable for discharge.   Social Worker assessment / plan:  CSW met with patient. Wife at bedside. CSW introduced role and explained that PT recommendations would be discussed. Patient and his wife are agreeable to SNF placement. Patient prefers a [facility] while his wife prefers [facility] so that family can visit easier. Will send referral to both areas. No further concerns. CSW encouraged patient and his wife to contact CSW as needed. CSW will continue to follow patient and his wife for support and facilitate discharge to SNF once medically stable.  Employment status:  Retired Nurse, adult PT Recommendations:  [place] / Referral to community resources:  [place]  Patient/Family's Response to care:  Patient and his wife agreeable to SNF placement. Patient's wife supportive and involved in patient's  care. Patient and his wife appreciated social work intervention.  Patient/Family's Understanding of and Emotional Response to Diagnosis, Current Treatment, and Prognosis:  Patient and his wife have a good understanding of the reason for admission and his need for rehab prior to returning home. Patient and his wife appear happy with hospital care.  Emotional Assessment Appearance:  Appears stated age Attitude/Demeanor/Rapport:  Engaged, Gracious Affect (typically observed):  Accepting, Appropriate, Calm, Pleasant Orientation:  Oriented to Self, Oriented to Place, Oriented to  Time, Oriented to Situation Alcohol / Substance use:  Never Used Psych involvement (Current and /or in the community):  No (Comment)  Discharge Needs  Concerns to be addressed:  Care Coordination Readmission within the last 30 days:  No Current discharge risk:  Dependent with Mobility Barriers to Discharge:  Continued Medical Work up, [place], LCSW 05/16/2018, 12:10 PM

## 2018-05-16 NOTE — Progress Notes (Signed)
Occupational Therapy Treatment Patient Details Name: Justin Leach MRN: [id number] DOB: 1932-04-08 Today's Date: 05/16/2018    History of present illness Patient is a 84 year old male with past medical history of hypertension, hyperlipidemia, diabetes mellitus, GERD, diastolic CHF, CKD stage III, coronary artery disease, paroxysmal A. fib on Coumadin, interstitial lung disease who presented with a fall at home.  X-ray of the left shoulder showed comminuted left humeral surgical neck fracture.  CT head showed bilateral subdural hematoma without midline shift.  INR found to be 3.1 now s/p Vit K and awaiting correction of INR prior to surgical fixation of shoulder.    OT comments  Patient supine in bed and agreeable to OT.  Patient educated on importance of ROM to distal L UE, edema mgmt, sling positioning and support.  Patient requires AAROM for hand extension today and able to complete gross flexion with encouragement, PROM for very limited wrist ROM (~5 degrees) and very limited supination (~2 degrees) due to pain.  Removed watch from L wrist with noted indentations  (placed on R wrist) and repositioned sling to provide increased support and limit edema from wrist/hand (indentations in wrist), then supported UE on blankets under forearm.  Pt verbalizes understanding and agreeable to hand pumping, but anticipate patient will not comply to exercise program.  Short blessed test completed scoring 16/28 (signficant impairments) in attention, sequencing and short term memory; pt perseverating throughout session voicing "Its just a bad day. I have a lot on my mind." .  No mobility completed today, as patient unable to tolerate.  RN aware of pain.  Will continue to follow.   Follow Up Recommendations  SNF;Supervision/Assistance - 24 hour    Equipment Recommendations  Other (comment)(TBD at next venue of care)    Recommendations for Other Services      Precautions / Restrictions  Precautions Precautions: Fall Precaution Comments: no ROM at shoulder, progress towards AROM at elbow as tolerated  Required Braces or Orthoses: Sling Restrictions Weight Bearing Restrictions: Yes LUE Weight Bearing: Non weight bearing       Mobility Bed Mobility               General bed mobility comments: remained supine in bed, repositioned L UE with support of blankets in hopes to decreased pain  Transfers                 General transfer comment: NT    Balance       Sitting balance - Comments: NT                                   ADL either performed or assessed with clinical judgement   ADL Overall ADL's : Needs assistance/impaired                                       General ADL Comments: session focused on L UE ROM, cognition and sling mgmt      Vision       Perception     Praxis      Cognition Arousal/Alertness: Awake/alert Behavior During Therapy: Anxious Overall Cognitive Status: Impaired/Different from baseline Area of Impairment: Following commands;Awareness;Problem solving;Attention                   Current Attention Level: Focused   Following Commands: Follows one  step commands with increased time   Awareness: Emergent Problem Solving: Difficulty sequencing;Requires verbal cues General Comments: anxious; short blessed completed: 16/28 (significant impairment)         Exercises Exercises: Shoulder;Other exercises Shoulder Exercises Wrist Flexion: PROM;Left;10 reps;Supine Wrist Extension: PROM;Left;10 reps;Supine Digit Composite Flexion: AROM;Left;10 reps;Supine Composite Extension: AAROM;Left;10 reps;Supine Other Exercises Other Exercises: supination/pronation x5 reps L side    Shoulder Instructions Shoulder Instructions ROM for elbow, wrist and digits of operated UE: Maximal assistance Sling wearing schedule (on at all times/off for ADL's): Maximal assistance     General  Comments pt on RA during session desat to 88% at times during L UE exercises given cueing for PLB and returning to 92%    Pertinent Vitals/ Pain       Pain Assessment: Faces Faces Pain Scale: Hurts worst Pain Location: L shoulder and generalized with mobility Pain Descriptors / Indicators: Aching;Sore;Guarding;Moaning Pain Intervention(s): Monitored during session;RN gave pain meds during session;Limited activity within patient's tolerance  Home Living                                          Prior Functioning/Environment              Frequency  Min 3X/week        Progress Toward Goals  OT Goals(current goals can now be found in the care plan section)  Progress towards OT goals: Progressing toward goals  Acute Rehab OT Goals Patient Stated Goal: to return to independent, knows he will need rehab OT Goal Formulation: With patient Time For Goal Achievement: 05/29/18 Potential to Achieve Goals: Good  Plan Discharge plan remains appropriate;Frequency remains appropriate    Co-evaluation                 AM-PAC OT "6 Clicks" Daily Activity     Outcome Measure   Help from another person eating meals?: A Little Help from another person taking care of personal grooming?: A Little Help from another person toileting, which includes using toliet, bedpan, or urinal?: Total Help from another person bathing (including washing, rinsing, drying)?: A Lot Help from another person to put on and taking off regular upper body clothing?: A Lot Help from another person to put on and taking off regular lower body clothing?: Total 6 Click Score: 12    End of Session Equipment Utilized During Treatment: Other (comment)(sling)  OT Visit Diagnosis: Other abnormalities of gait and mobility (R26.89);Pain Pain - Right/Left: Left Pain - part of body: Shoulder   Activity Tolerance Patient limited by pain   Patient Left in bed;with call bell/phone within reach    Nurse Communication Mobility status(pain )        Time: 9449-6759 OT Time Calculation (min): 27 min  Charges: OT General Charges $OT Visit: 1 Visit OT Treatments $Therapeutic Activity: 8-22 mins $Therapeutic Exercise: 8-22 mins  Delight Stare, OT Acute Rehabilitation Services Pager [phone] Office [phone]    Delight Stare 05/16/2018, 4:35 PM

## 2018-05-16 NOTE — Clinical Social Work Placement (Signed)
   CLINICAL SOCIAL WORK PLACEMENT  NOTE  Date:  05/16/2018  Patient Details  Name: Justin Leach MRN: [id number] Date of Birth: January 02, 1932  Clinical Social Work is seeking post-discharge placement for this patient at the [place] level of care (*CSW will initial, date and re-position this form in  chart as items are completed):      Patient/family provided with [facility] Work Department's list of facilities offering this level of care within the geographic area requested by the patient (or if unable, by the patient's family).      Patient/family informed of their freedom to choose among providers that offer the needed level of care, that participate in Medicare, Medicaid or managed care program needed by the patient, have an available bed and are willing to accept the patient.      Patient/family informed of [facility]'s ownership interest in [facility] and [facility], as well as of the fact that they are under no obligation to receive care at these facilities.  PASRR submitted to EDS on 05/14/18     PASRR number received on 05/14/18     Existing PASRR number confirmed on       FL2 transmitted to all facilities in geographic area requested by pt/family on 05/16/18     FL2 transmitted to all facilities within larger geographic area on       Patient informed that his/her managed care company has contracts with or will negotiate with certain facilities, including the following:            Patient/family informed of bed offers received.  Patient chooses bed at       Physician recommends and patient chooses bed at      Patient to be transferred to   on  .  Patient to be transferred to facility by       Patient family notified on   of transfer.  Name of family member notified:        PHYSICIAN       Additional Comment:    _______________________________________________ Candie Chroman, LCSW 05/16/2018, 12:12 PM

## 2018-05-17 LAB — GLUCOSE, CAPILLARY
Glucose-Capillary: 51 mg/dL — ABNORMAL LOW (ref 70–99)
Glucose-Capillary: 86 mg/dL (ref 70–99)

## 2018-05-17 NOTE — Progress Notes (Signed)
[facility]  PROGRESS NOTE  IVY MERIWETHER EHU:[id number] DOB: 05/05/1932 DOA: 05/13/2018 PCP: Lavone Orn, MD   Brief HPI:   84 year old male with a history of hypertension, hyperlipidemia, diabetes mellitus, GERD, diastolic CHF, CKD stage III, chronic artery disease, paroxysmal atrial fibrillation on Coumadin, interstitial lung disease who came to the ED with a fall at home.  He struck his head at the table.  Sustained skin laceration of the left eyebrow.  Was found to have elevated INR of 3.1.  X-ray of left shoulder showed comminuted left humeral surgical neck fracture.  CT head showed bilateral subdural hematoma without midline shift.  He was given vitamin K 10 mg and Kcentra in the ED.  Neurosurgery was consulted and recommended no intervention at this time.  Orthopedics has plan to operate for his left lumbar fracture on Tuesday.   Subjective   Patient seen and examined, became confused last night.  Somnolent this morning but arousable.  He is alert oriented x3   Assessment/Plan:    1. Subdural hematoma-CT head showed acute subdural hematoma  Left, 10 mm overlying the left frontal, temporal and parietal lobes greater than right 3 mm overlying right temporal lobe without mass effect.  No midline shift.  Neurosurgery was consulted.  No intervention recommended.  Repeat head CT on 05/14/2018 showed decreased size of bilateral acute subdural hematomas measuring up to 6 mm on the left.  2. Confusion-patient blood glucose was 51 last night that could have contributed to patient's confusion.  Patient is on Levemir 30 units in daytime and 20 units at bedtime.  Will discontinue Levemir 20 units subcu at bedtime  3. Closed comminuted left humeral fracture-orthopedics following, plan for reverse shoulder arthroplasty on Tuesday.  Continue pain management, continue sling, nonweightbearing for now.  Continue oxycodone 1 tablet every 4 hours as needed.  4. Acute kidney injury on CKD stage  III-patient presented with creatinine of 1.66.  Baseline creatinine around 1.7-2.  Creatinine bumped to 2.9 yesterday.  Bladder scan showed no significant residual.  Started on IV normal saline.  Today creatinine is improved to 2.07.  We will check BMP in a.m..  5. Hyperlipidemia-continue Lipitor  6. Coronary artery calcification seen on CT scan-continue Lipitor metoprolol  7. Chronic diastolic ZCH-8I echo on 09/19/7739 showed EF 65%.  No significant peripheral edema, currently CHF is compensated.  8. Paroxysmal atrial fibrillation- CHA2DS2VASc score is 6.  Coumadin is on hold.  Coumadin will be held permanently moved around hospital. [facility] scheduled as needed schedule eart rate is well controlled.  Continue metoprolol  9. Diabetes mellitus type 2-hemoglobin A1c 6.3.  Continue Levemir and sliding scale insulin with NovoLog.  Will cut down the dose of Levemir to 30 units subcu daily.  10. Hypertension-blood pressure is mildly elevated, lisinopril is on hold due to AKI.  Will start hydralazine PRN.  11. Hyperkalemia-resolved    CBG: Recent Labs  Lab 05/16/18 0758 05/16/18 1213 05/16/18 1648 05/16/18 2135 05/17/18 1220  GLUCAP 119* 126* 90 123* 51*    CBC: Recent Labs  Lab 05/13/18 1937 05/15/18 0342 05/16/18 0503  WBC 19.0* 15.5* 14.1*  NEUTROABS 15.8* 11.9* 10.7*  HGB 14.6 11.3* 11.5*  HCT 46.5 36.4* 37.3*  MCV 96.1 97.6 96.9  PLT 243 199 287    Basic Metabolic Panel: Recent Labs  Lab 05/13/18 1937 05/15/18 0342 05/16/18 0503  NA 140 138 136  K 5.5* 4.9 4.4  CL 98 97* 99  CO2 32 30 27  GLUCOSE 186*  194* 189*  BUN 31* 50* 52*  CREATININE 1.66* 2.90* 2.07*  CALCIUM 9.6 8.6* 8.3*     DVT prophylaxis: SCDs  Code Status: Full code  Family Communication: No family at bedside  Disposition Plan: Skilled nursing facility   Consultants:  Orthopedics  Neurosurgery  Procedures:  None   Antibiotics:   Anti-infectives (From admission, onward)    None       Objective   Vitals:   05/16/18 2300 05/17/18 0400 05/17/18 1200 05/17/18 1204  BP:   (!) 158/81   Pulse:   63   Resp:   19   Temp: 98.4 F (36.9 C) 99.2 F (37.3 C)  98 F (36.7 C)  TempSrc: Oral Oral  Oral  SpO2:   98%   Weight:      Height:        Intake/Output Summary (Last 24 hours) at 05/17/2018 1451 Last data filed at 05/17/2018 1436 Gross per 24 hour  Intake 2765.03 ml  Output 550 ml  Net 2215.03 ml   Filed Weights   05/13/18 1905  Weight: 72.6 kg     Physical Examination:    General: Appears in no acute distress  Cardiovascular: S1-S2, regular, no murmur auscultated  Respiratory: Clear to auscultation bilaterally, no wheezing or crackles  Abdomen: Soft, nontender, no organomegaly  Extremities: Left shoulder in sling  Neurologic: Alert, oriented x3, no focal deficit noted     Data Reviewed: I have personally reviewed following labs and imaging studies   Recent Results (from the past 240 hour(s))  Culture, blood (Routine X 2) w Reflex to ID Panel     Status: None (Preliminary result)   Collection Time: 05/13/18 10:20 PM  Result Value Ref Range Status   Specimen Description BLOOD RIGHT HAND  Final   Special Requests   Final    BOTTLES DRAWN AEROBIC AND ANAEROBIC Blood Culture adequate volume   Culture   Final    NO GROWTH 4 DAYS Performed at [facility], [address], [place], [place] [address]    Report Status PENDING  Incomplete  Culture, blood (Routine X 2) w Reflex to ID Panel     Status: None (Preliminary result)   Collection Time: 05/13/18 10:50 PM  Result Value Ref Range Status   Specimen Description BLOOD RIGHT HAND  Final   Special Requests   Final    BOTTLES DRAWN AEROBIC AND ANAEROBIC Blood Culture adequate volume   Culture   Final    NO GROWTH 4 DAYS Performed at [facility], [place] [address], [place], [place] [address]    Report Status PENDING  Incomplete  MRSA PCR Screening     Status: Abnormal    Collection Time: 05/14/18  1:00 AM  Result Value Ref Range Status   MRSA by PCR POSITIVE (A) NEGATIVE Final    Comment:        The GeneXpert MRSA Assay (FDA approved for NASAL specimens only), is one component of a comprehensive MRSA colonization surveillance program. It is not intended to diagnose MRSA infection nor to guide or monitor treatment for MRSA infections. RESULT CALLED TO, READ BACK BY AND VERIFIED WITH: Cain Saupe RN 05/14/18 0319 JDW Performed at [facility], [address] [place],  [address]      Liver Function Tests: Recent Labs  Lab 05/13/18 1937  AST 32  ALT 33  ALKPHOS 98  BILITOT 0.9  PROT 6.1*  ALBUMIN 3.4*   No results  for input(s): LIPASE, AMYLASE in the last 168 hours. No results for input(s): AMMONIA in the last 168 hours.  Cardiac Enzymes: Recent Labs  Lab 05/13/18 1937 05/13/18 2246  CKTOTAL  --  215  TROPONINI <0.03  --    BNP (last 3 results) Recent Labs    05/13/18 2213  BNP 129.8*    ProBNP (last 3 results) No results for input(s): PROBNP in the last 8760 hours.    Studies: No results found.  Scheduled Meds: . atorvastatin  40 mg Oral QPM  . Chlorhexidine Gluconate Cloth  6 each Topical Q0600  . cholecalciferol  1,000 Units Oral Daily  . dextrose  50 mL Intravenous Once  . hydrocortisone cream   Topical TID  . insulin aspart  0-9 Units Subcutaneous TID WC  . insulin detemir  20 Units Subcutaneous QHS  . insulin detemir  30 Units Subcutaneous Daily  . metoprolol tartrate  50 mg Oral BID  . mupirocin ointment  1 application Nasal BID  . pantoprazole  40 mg Oral Daily  . vitamin B-12  1,000 mcg Oral Daily    Admission status: Observation/Inpatient: Based on patients clinical presentation and evaluation of above clinical data, I have made determination that patient meets Inpatient criteria at this time.  Time spent: 60 minutes  [facility] Pager [phone]. If 7PM-7AM,  please contact night-coverage at [email or web address], Office  [phone]  password [email or web address]  05/17/2018, 2:51 PM  LOS: 4 days

## 2018-05-18 LAB — CULTURE, BLOOD (ROUTINE X 2)
Culture: NO GROWTH
Culture: NO GROWTH
Special Requests: ADEQUATE
Special Requests: ADEQUATE

## 2018-05-18 LAB — BASIC METABOLIC PANEL
Anion gap: 7 (ref 5–15)
BUN: 33 mg/dL — ABNORMAL HIGH (ref 8–23)
CALCIUM: 8.2 mg/dL — AB (ref 8.9–10.3)
CO2: 24 mmol/L (ref 22–32)
Chloride: 105 mmol/L (ref 98–111)
Creatinine, Ser: 1.44 mg/dL — ABNORMAL HIGH (ref 0.61–1.24)
GFR calc Af Amer: 51 mL/min — ABNORMAL LOW (ref >60)
GFR calc non Af Amer: 44 mL/min — ABNORMAL LOW (ref >60)
Glucose, Bld: 115 mg/dL — ABNORMAL HIGH (ref 70–99)
Potassium: 4.3 mmol/L (ref 3.5–5.1)
Sodium: 136 mmol/L (ref 135–145)

## 2018-05-18 LAB — GLUCOSE, CAPILLARY
GLUCOSE-CAPILLARY: 142 mg/dL — AB (ref 70–99)
Glucose-Capillary: 88 mg/dL (ref 70–99)
Glucose-Capillary: 101 mg/dL — ABNORMAL HIGH (ref 70–99)
Glucose-Capillary: 118 mg/dL — ABNORMAL HIGH (ref 70–99)

## 2018-05-18 MED ORDER — CHLORHEXIDINE GLUCONATE 4 % EX LIQD: 60.0000 mL | Freq: Once | CUTANEOUS | Status: DC

## 2018-05-18 NOTE — Progress Notes (Signed)
[facility]  PROGRESS NOTE  Justin Leach XAJ:[id number] DOB: 07/26/1931 DOA: 05/13/2018 PCP: Lavone Orn, MD   Brief HPI:   84 year old male with a history of hypertension, hyperlipidemia, diabetes mellitus, GERD, diastolic CHF, CKD stage III, chronic artery disease, paroxysmal atrial fibrillation on Coumadin, interstitial lung disease who came to the ED with a fall at home.  He struck his head at the table.  Sustained skin laceration of the left eyebrow.  Was found to have elevated INR of 3.1.  X-ray of left shoulder showed comminuted left humeral surgical neck fracture.  CT head showed bilateral subdural hematoma without midline shift.  He was given vitamin K 10 mg and Kcentra in the ED.  Neurosurgery was consulted and recommended no intervention at this time.  Orthopedics has plan to operate for his left lumbar fracture on Tuesday.   Subjective   Patient seen and examined, no new complaints.  Continues to have left shoulder pain.  Plan for surgery in a.m.   Assessment/Plan:    1. Subdural hematoma-CT head showed acute subdural hematoma  Left, 10 mm overlying the left frontal, temporal and parietal lobes greater than right 3 mm overlying right temporal lobe without mass effect.  No midline shift.  Neurosurgery was consulted.  No intervention recommended.  Repeat head CT on 05/14/2018 showed decreased size of bilateral acute subdural hematomas measuring up to 6 mm on the left.  2. Confusion-patient blood glucose was 51 last night that could have contributed to patient's confusion.  Patient is on Levemir 30 units in daytime and 20 units at bedtime.  Will discontinue Levemir 20 units subcu at bedtime  3. Closed comminuted left humeral fracture-orthopedics following, plan for reverse shoulder arthroplasty on Tuesday.  Continue pain management, continue sling, nonweightbearing for now.  Continue oxycodone 1 tablet every 4 hours as needed.  Plan for surgery in a.m.  4. Acute kidney  injury on CKD stage III-patient presented with creatinine of 1.66.  Baseline creatinine around 1.7-2.  Creatinine bumped to 2.9 yesterday.  Bladder scan showed no significant residual.  Started on IV normal saline.  Today creatinine is improved to 1.44  5. Hyperlipidemia-continue Lipitor  6. Coronary artery calcification seen on CT scan-continue Lipitor metoprolol  7. Chronic diastolic CNO-7S echo on 9/62/8366 showed EF 65%.  No significant peripheral edema, currently CHF is compensated.  8. Paroxysmal atrial fibrillation- CHA2DS2VASc score is 6.  Coumadin is on hold.  Coumadin will be held permanently, given subdural hematoma.  Heart rate is well controlled.  Continue metoprolol  9. Diabetes mellitus type 2-hemoglobin A1c 6.3.  Continue Levemir and sliding scale insulin with NovoLog.  Blood glucose is better controlled after cutting down the dose of Levemir to 30 units subcu daily.  10. Hypertension-blood pressure is mildly elevated, lisinopril is on hold due to AKI.  Will start hydralazine PRN.  11. Hyperkalemia-resolved    CBG: Recent Labs  Lab 05/16/18 2135 05/17/18 1220 05/17/18 1559 05/18/18 0813 05/18/18 1156  GLUCAP 123* 51* 86 88 101*    CBC: Recent Labs  Lab 05/13/18 1937 05/15/18 0342 05/16/18 0503  WBC 19.0* 15.5* 14.1*  NEUTROABS 15.8* 11.9* 10.7*  HGB 14.6 11.3* 11.5*  HCT 46.5 36.4* 37.3*  MCV 96.1 97.6 96.9  PLT 243 199 294    Basic Metabolic Panel: Recent Labs  Lab 05/13/18 1937 05/15/18 0342 05/16/18 0503 05/18/18 0039  NA 140 138 136 136  K 5.5* 4.9 4.4 4.3  CL 98 97* 99 105  CO2 32 30  27 24  GLUCOSE 186* 194* 189* 115*  BUN 31* 50* 52* 33*  CREATININE 1.66* 2.90* 2.07* 1.44*  CALCIUM 9.6 8.6* 8.3* 8.2*     DVT prophylaxis: SCDs  Code Status: Full code  Family Communication: No family at bedside  Disposition Plan: Skilled nursing facility   Consultants:  Orthopedics  Neurosurgery  Procedures:  None   Antibiotics:    Anti-infectives (From admission, onward)   None       Objective   Vitals:   05/18/18 0400 05/18/18 0800 05/18/18 1005 05/18/18 1200  BP: (!) 159/50 (!) 188/59    Pulse: (!) 59 69    Resp: 13 (!) 5    Temp: 99 F (37.2 C)   98.2 F (36.8 C)  TempSrc: Axillary     SpO2: 97% 97% 92%   Weight:      Height:        Intake/Output Summary (Last 24 hours) at 05/18/2018 1210 Last data filed at 05/18/2018 0800 Gross per 24 hour  Intake 3223.32 ml  Output 675 ml  Net 2548.32 ml   Filed Weights   05/13/18 1905  Weight: 72.6 kg     Physical Examination:     General: Appears in no acute distress  Cardiovascular: S1-S2, regular  Respiratory: Clear to auscultation bilaterally  Abdomen: Soft, nontender, no organomegaly  Musculoskeletal: No edema of the lower extremities, left shoulder in sling     Data Reviewed: I have personally reviewed following labs and imaging studies   Recent Results (from the past 240 hour(s))  Culture, blood (Routine X 2) w Reflex to ID Panel     Status: None (Preliminary result)   Collection Time: 05/13/18 10:20 PM  Result Value Ref Range Status   Specimen Description BLOOD RIGHT HAND  Final   Special Requests   Final    BOTTLES DRAWN AEROBIC AND ANAEROBIC Blood Culture adequate volume   Culture   Final    NO GROWTH 4 DAYS Performed at [facility], [address] [place], [place] [address]    Report Status PENDING  Incomplete  Culture, blood (Routine X 2) w Reflex to ID Panel     Status: None (Preliminary result)   Collection Time: 05/13/18 10:50 PM  Result Value Ref Range Status   Specimen Description BLOOD RIGHT HAND  Final   Special Requests   Final    BOTTLES DRAWN AEROBIC AND ANAEROBIC Blood Culture adequate volume   Culture   Final    NO GROWTH 4 DAYS Performed at [facility], [place] [address] [place], [place] [address]    Report Status PENDING  Incomplete  MRSA PCR Screening     Status: Abnormal    Collection Time: 05/14/18  1:00 AM  Result Value Ref Range Status   MRSA by PCR POSITIVE (A) NEGATIVE Final    Comment:        The GeneXpert MRSA Assay (FDA approved for NASAL specimens only), is one component of a comprehensive MRSA colonization surveillance program. It is not intended to diagnose MRSA infection nor to guide or monitor treatment for MRSA infections. RESULT CALLED TO, READ BACK BY AND VERIFIED WITH: Cain Saupe RN 05/14/18 0319 JDW Performed at [facility], [address], [place],  [address]      Liver Function Tests: Recent Labs  Lab 05/13/18 1937  AST 32  ALT 33  ALKPHOS 98  BILITOT 0.9  PROT 6.1*  ALBUMIN 3.4*   No results  for input(s): LIPASE, AMYLASE in the last 168 hours. No results for input(s): AMMONIA in the last 168 hours.  Cardiac Enzymes: Recent Labs  Lab 05/13/18 1937 05/13/18 2246  CKTOTAL  --  215  TROPONINI <0.03  --    BNP (last 3 results) Recent Labs    05/13/18 2213  BNP 129.8*    ProBNP (last 3 results) No results for input(s): PROBNP in the last 8760 hours.    Studies: No results found.  Scheduled Meds: . atorvastatin  40 mg Oral QPM  . Chlorhexidine Gluconate Cloth  6 each Topical Q0600  . cholecalciferol  1,000 Units Oral Daily  . dextrose  50 mL Intravenous Once  . hydrocortisone cream   Topical TID  . insulin aspart  0-9 Units Subcutaneous TID WC  . insulin detemir  30 Units Subcutaneous Daily  . metoprolol tartrate  50 mg Oral BID  . mupirocin ointment  1 application Nasal BID  . pantoprazole  40 mg Oral Daily  . vitamin B-12  1,000 mcg Oral Daily    Admission status: Observation/Inpatient: Based on patients clinical presentation and evaluation of above clinical data, I have made determination that patient meets Inpatient criteria at this time.  Time spent: 60 minutes  [facility] Pager [phone]. If 7PM-7AM, please contact night-coverage at [email or web address], Office   [phone]  password [email or web address]  05/18/2018, 12:10 PM  LOS: 5 days

## 2018-05-18 NOTE — Progress Notes (Signed)
Occupational Therapy Treatment Patient Details Name: Justin Leach MRN: [id number] DOB: 01/11/1932 Today's Date: 05/18/2018    History of present illness Patient is a 84 year old male with PMHx: HTN, HLD, DM, GERD, diastolic CHF, CKD stage III, CAD, PAF, interstitial lung disease who presented with a fall at home.  X-ray of the left shoulder showed comminuted left humeral neck fx planned OR 12/31.  CT head showed bil SDH   OT comments  Patient supine in bed, asleep upon entry.  Easily awoken, but requires cueing throughout session to maintain alertness.  Patient on 2L supplemental oxygen via [place] throughout session, removed sitting EOB dropping to 85% therefore utilized during session.  Patient able to wash face with min assist today, maintain sitting EOB balance with min guard for safety, and complete basic stand pivot (simulated toilet transfer) with max assist +2. Engaged in ROM exercises to L UE (limited elbow, but able to tolerate increased ROM at forearm and wrist today), continued to encourage hand pumps and educated on positioning of sling due to edema in hand.  Pt able to utilize R UE but reports "soreness".    Pt continues to be limited by cognition, pain, and activity tolerance. DC plan remains appropriate.    Follow Up Recommendations  SNF;Supervision/Assistance - 24 hour    Equipment Recommendations  Other (comment)(TBD at next venue of care)    Recommendations for Other Services      Precautions / Restrictions Precautions Precautions: Fall Precaution Comments: no ROM Left shoulder, watch sats Required Braces or Orthoses: Sling Restrictions Weight Bearing Restrictions: Yes LUE Weight Bearing: Non weight bearing       Mobility Bed Mobility Overal bed mobility: Needs Assistance Bed Mobility: Supine to Sit     Supine to sit: HOB elevated;Mod assist;+2 for physical assistance Sit to supine: Max assist;+2 for physical assistance   General bed mobility comments: HOB 30  degrees with max multimodal cues to initiate, sequence and move bil LE to EOB with assist of rail and physical assist to elevate trunk. REquired RUE support for balance in sitting  Transfers Overall transfer level: Needs assistance   Transfers: Sit to/from Stand;Stand Pivot Transfers Sit to Stand: Mod assist;+2 safety/equipment;From elevated surface Stand pivot transfers: Max assist;+2 physical assistance       General transfer comment: sit to stand x 2 with mod assist+2 from elevated EOB, pivot towards L side with poor coordination and sequencing of L LE.     Balance Overall balance assessment: Needs assistance Sitting-balance support: Single extremity supported Sitting balance-Leahy Scale: Fair Sitting balance - Comments: pt able to sit EOB grossly 8 min with RUE support, without UE support leans right   Standing balance support: Single extremity supported Standing balance-Leahy Scale: Poor Standing balance comment: RUE support on P.T. arm                           ADL either performed or assessed with clinical judgement   ADL Overall ADL's : Needs assistance/impaired     Grooming: Wash/dry face;Minimal assistance;Sitting Grooming Details (indicate cue type and reason): min assist to complete thoroughness                 Toilet Transfer: Maximal assistance;+2 for safety/equipment;+2 for physical assistance;Stand-pivot Toilet Transfer Details (indicate cue type and reason): simulated to recliner          Functional mobility during ADLs: Maximal assistance;Moderate assistance;+2 for physical assistance;+2 for safety/equipment General ADL Comments: patient  continues to be limited by pain, decreased activity tolerance and cognition     Vision       Perception     Praxis      Cognition Arousal/Alertness: Awake/alert Behavior During Therapy: Flat affect Overall Cognitive Status: No family/caregiver present to determine baseline cognitive  functioning Area of Impairment: Orientation;Attention;Memory;Problem solving;Following commands;Safety/judgement                 Orientation Level: Disoriented to;Time;Place Current Attention Level: Sustained Memory: Decreased short-term memory Following Commands: Follows one step commands with increased time Safety/Judgement: Decreased awareness of safety;Decreased awareness of deficits Awareness: Emergent Problem Solving: Difficulty sequencing;Requires verbal cues;Slow processing General Comments: anxious and requires constant cueing for attention and maintaining alertness throughout session        Exercises Exercises: Shoulder;Other exercises General Exercises - Lower Extremity Long Arc Quad: AAROM;10 reps;Seated;Both Hip Flexion/Marching: AAROM;10 reps;Seated;Both Shoulder Exercises Shoulder Flexion: AROM;Right;10 reps;Seated Elbow Flexion: PROM;Left;5 reps;Seated Elbow Extension: PROM;Left;5 reps;Seated Wrist Flexion: PROM;Left;10 reps;Seated Wrist Extension: PROM;Left;10 reps;Seated Digit Composite Flexion: AROM;Left;10 reps;Seated Composite Extension: AAROM;Left;10 reps;Seated Other Exercises Other Exercises: supination/pronation x5 reps L side    Shoulder Instructions Shoulder Instructions Donning/doffing sling/immobilizer: Maximal assistance Correct positioning of sling/immobilizer: Maximal assistance ROM for elbow, wrist and digits of operated UE: Maximal assistance Sling wearing schedule (on at all times/off for ADL's): Maximal assistance     General Comments on 2L supplemental oxgyen via [place], removed shortly with oxygen saturations dipping to 85%    Pertinent Vitals/ Pain       Pain Assessment: Faces Faces Pain Scale: Hurts even more Pain Location: L shoulder and generalized with mobility Pain Descriptors / Indicators: Aching;Sore;Guarding;Moaning Pain Intervention(s): Limited activity within patient's tolerance;Premedicated before  session;Repositioned;Monitored during session  Home Living                                          Prior Functioning/Environment              Frequency  Min 3X/week        Progress Toward Goals  OT Goals(current goals can now be found in the care plan section)  Progress towards OT goals: Progressing toward goals  Acute Rehab OT Goals Patient Stated Goal: to return to independent, knows he will need rehab OT Goal Formulation: With patient Time For Goal Achievement: 05/29/18 Potential to Achieve Goals: Good  Plan Discharge plan remains appropriate;Frequency remains appropriate    Co-evaluation    PT/OT/SLP Co-Evaluation/Treatment: Yes Reason for Co-Treatment: Complexity of the patient's impairments (multi-system involvement) PT goals addressed during session: Mobility/safety with mobility;Balance OT goals addressed during session: ADL's and self-care      AM-PAC OT "6 Clicks" Daily Activity     Outcome Measure   Help from another person eating meals?: A Little Help from another person taking care of personal grooming?: A Little Help from another person toileting, which includes using toliet, bedpan, or urinal?: Total Help from another person bathing (including washing, rinsing, drying)?: A Lot Help from another person to put on and taking off regular upper body clothing?: A Lot Help from another person to put on and taking off regular lower body clothing?: Total 6 Click Score: 12    End of Session Equipment Utilized During Treatment: Oxygen;Other (comment)(sling)  OT Visit Diagnosis: Other abnormalities of gait and mobility (R26.89);Pain Pain - Right/Left: Left Pain - part of body: Shoulder   Activity  Tolerance Patient limited by pain   Patient Left with call bell/phone within reach;in chair;with chair alarm set;with nursing/sitter in room   Nurse Communication Mobility status(pain)        Time: 6924-9324 OT Time Calculation (min):  31 min  Charges: OT General Charges $OT Visit: 1 Visit OT Treatments $Self Care/Home Management : 8-22 mins  Delight Stare, [place] Pager [phone] Office [phone]    Delight Stare 05/18/2018, 10:21 AM

## 2018-05-18 NOTE — Progress Notes (Signed)
Physical Therapy Treatment Patient Details Name: Justin Leach MRN: [id number] DOB: [birth year] Today's Date: 05/18/2018    History of Present Illness Patient is a 84 year old male with PMHx: HTN, HLD, DM, GERD, diastolic CHF, CKD stage III, CAD, PAF, interstitial lung disease who presented with a fall at home.  X-ray of the left shoulder showed comminuted left humeral neck fx planned OR 12/31.  CT head showed bil SDH    PT Comments    Pt with eyes closed on arrival, easily awoken and able to state he met his wife in college after being in the [employer]. Pt states he doesn't remember the fall but when asking if he was in the bathroom he quickly stated " no I tripped in the kitchen". Pt with SpO2 dropping to 85% on RA even with cues for breathing and posture and required 2L to maintain 90-93% throughout session. Pt with improved activity tolerance this session able to sit EOB grossly 8 min, stand x 2 trials and pivot to chair. Will continue to follow.     Follow Up Recommendations  SNF;Supervision/Assistance - 24 hour     Equipment Recommendations  Other (comment)(TBD)    Recommendations for Other Services       Precautions / Restrictions Precautions Precautions: Fall Precaution Comments: no ROM Left shoulder, watch sats Required Braces or Orthoses: Sling Restrictions LUE Weight Bearing: Non weight bearing    Mobility  Bed Mobility Overal bed mobility: Needs Assistance Bed Mobility: Supine to Sit     Supine to sit: HOB elevated;Mod assist;+2 for physical assistance     General bed mobility comments: HOB 30 degrees with max multimodal cues to initiate, sequence and move bil LE to EOB with assist of rail and physical assist to elevate trunk. REquired RUE support for balance in sitting  Transfers Overall transfer level: Needs assistance   Transfers: Sit to/from Stand;Stand Pivot Transfers Sit to Stand: Mod assist;+2 safety/equipment;From elevated surface Stand pivot  transfers: Max assist;+2 physical assistance       General transfer comment: mod assist to rise from elevated bed x 2 trials with pt grasping PT arm. Pivot toward left with assist to move LLE with physical assist to weight shift and for UE support. Pt side stepped 2 steps toward [facility] to right with only mod assist would recommend pivot Rt next trial  Ambulation/Gait             General Gait Details: did not attempt   Stairs             Wheelchair Mobility    Modified Rankin (Stroke Patients Only)       Balance Overall balance assessment: Needs assistance Sitting-balance support: Single extremity supported Sitting balance-Leahy Scale: Fair Sitting balance - Comments: pt able to sit EOB grossly 8 min with RUE support, without UE support leans right   Standing balance support: Single extremity supported Standing balance-Leahy Scale: Poor Standing balance comment: RUE support on P.T. arm                            Cognition Arousal/Alertness: Awake/alert Behavior During Therapy: Flat affect Overall Cognitive Status: No family/caregiver present to determine baseline cognitive functioning Area of Impairment: Orientation;Attention;Memory;Problem solving;Following commands;Safety/judgement                 Orientation Level: Disoriented to;Time;Place Current Attention Level: Sustained Memory: Decreased short-term memory Following Commands: Follows one step commands with increased time Safety/Judgement: Decreased awareness of  safety;Decreased awareness of deficits   Problem Solving: Difficulty sequencing;Requires verbal cues;Slow processing        Exercises General Exercises - Lower Extremity Long Arc Quad: AAROM;10 reps;Seated;Both Hip Flexion/Marching: AAROM;10 reps;Seated;Both    General Comments        Pertinent Vitals/Pain Faces Pain Scale: Hurts even more Pain Location: L shoulder and generalized with mobility Pain Descriptors /  Indicators: Aching;Sore;Guarding;Moaning Pain Intervention(s): Limited activity within patient's tolerance;Premedicated before session;Monitored during session;Repositioned    Home Living                      Prior Function            PT Goals (current goals can now be found in the care plan section) Progress towards PT goals: Progressing toward goals    Frequency    Min 2X/week      PT Plan Current plan remains appropriate;Frequency needs to be updated    Co-evaluation PT/OT/SLP Co-Evaluation/Treatment: Yes Reason for Co-Treatment: Complexity of the patient's impairments (multi-system involvement) PT goals addressed during session: Mobility/safety with mobility;Balance        AM-PAC PT "6 Clicks" Mobility   Outcome Measure  Help needed turning from your back to your side while in a flat bed without using bedrails?: A Lot Help needed moving from lying on your back to sitting on the side of a flat bed without using bedrails?: A Lot Help needed moving to and from a bed to a chair (including a wheelchair)?: A Lot Help needed standing up from a chair using your arms (e.g., wheelchair or bedside chair)?: A Lot Help needed to walk in hospital room?: Total Help needed climbing 3-5 steps with a railing? : Total 6 Click Score: 10    End of Session Equipment Utilized During Treatment: Other (comment)(sling) Activity Tolerance: Patient tolerated treatment well Patient left: in chair;with call bell/phone within reach;with chair alarm set;with nursing/sitter in room Nurse Communication: Mobility status PT Visit Diagnosis: Other abnormalities of gait and mobility (R26.89);History of falling (Z91.81);Pain;Muscle weakness (generalized) (M62.81)     Time: 5806-3868 PT Time Calculation (min) (ACUTE ONLY): 30 min  Charges:  $Therapeutic Activity: 8-22 mins                     [place], PT Acute Rehabilitation Services Pager: [phone] Office:  [place] 05/18/2018, 10:12 AM

## 2018-05-19 ENCOUNTER — Inpatient Hospital Stay (HOSPITAL_COMMUNITY): Payer: Medicare Other | Admitting: Registered Nurse

## 2018-05-19 ENCOUNTER — Inpatient Hospital Stay (HOSPITAL_COMMUNITY): Payer: Medicare Other

## 2018-05-19 ENCOUNTER — Encounter (HOSPITAL_COMMUNITY)
Admission: EM | Disposition: A | Discharge status: Skilled Nursing Facility | Payer: Self-pay | Source: Home / Self Care | Attending: Family Medicine

## 2018-05-19 ENCOUNTER — Encounter (HOSPITAL_COMMUNITY): Payer: Self-pay | Admitting: Certified Registered Nurse Anesthetist

## 2018-05-19 HISTORY — PX: REVERSE SHOULDER ARTHROPLASTY: SHX5054

## 2018-05-19 LAB — GLUCOSE, CAPILLARY
GLUCOSE-CAPILLARY: 128 mg/dL — AB (ref 70–99)
Glucose-Capillary: 114 mg/dL — ABNORMAL HIGH (ref 70–99)
Glucose-Capillary: 126 mg/dL — ABNORMAL HIGH (ref 70–99)
Glucose-Capillary: 145 mg/dL — ABNORMAL HIGH (ref 70–99)

## 2018-05-19 SURGERY — ARTHROPLASTY, SHOULDER, TOTAL, REVERSE
Anesthesia: General | Site: Shoulder | Laterality: Left

## 2018-05-19 MED ORDER — FENTANYL CITRATE (PF) 100 MCG/2ML IJ SOLN
INTRAMUSCULAR | Status: AC
  Administered 2018-05-19: 50 ug via INTRAVENOUS
  Filled 2018-05-19: qty 2

## 2018-05-19 MED ORDER — MEPERIDINE HCL 50 MG/ML IJ SOLN: 6.2500 mg | INTRAMUSCULAR | Status: DC | PRN

## 2018-05-19 MED ORDER — DEXMEDETOMIDINE HCL IN NACL 200 MCG/50ML IV SOLN
INTRAVENOUS | Status: DC | PRN
  Administered 2018-05-19: .5 ug/kg/h via INTRAVENOUS

## 2018-05-19 MED ORDER — MENTHOL 3 MG MT LOZG: 1.0000 | LOZENGE | OROMUCOSAL | Status: DC | PRN

## 2018-05-19 MED ORDER — FENTANYL CITRATE (PF) 100 MCG/2ML IJ SOLN
INTRAMUSCULAR | Status: DC | PRN
  Administered 2018-05-19: 50 ug via INTRAVENOUS

## 2018-05-19 MED ORDER — PROPOFOL 10 MG/ML IV BOLUS
INTRAVENOUS | Status: DC | PRN
  Administered 2018-05-19: 80 mg via INTRAVENOUS

## 2018-05-19 MED ORDER — SODIUM CHLORIDE 0.9 % IV SOLN
INTRAVENOUS | Status: DC | PRN
  Administered 2018-05-19: 40 ug/min via INTRAVENOUS

## 2018-05-19 MED ORDER — SUCCINYLCHOLINE CHLORIDE 200 MG/10ML IV SOSY
PREFILLED_SYRINGE | INTRAVENOUS | Status: AC
  Filled 2018-05-19: qty 10

## 2018-05-19 MED ORDER — ROCURONIUM BROMIDE 100 MG/10ML IV SOLN
INTRAVENOUS | Status: DC | PRN
  Administered 2018-05-19: 50 mg via INTRAVENOUS

## 2018-05-19 MED ORDER — SUCCINYLCHOLINE CHLORIDE 20 MG/ML IJ SOLN
INTRAMUSCULAR | Status: DC | PRN
  Administered 2018-05-19: 140 mg via INTRAVENOUS

## 2018-05-19 MED ORDER — FENTANYL CITRATE (PF) 100 MCG/2ML IJ SOLN
25.0000 ug | INTRAMUSCULAR | Status: DC | PRN
  Administered 2018-05-19 (×2): 50 ug via INTRAVENOUS

## 2018-05-19 MED ORDER — TRANEXAMIC ACID-NACL 1000-0.7 MG/100ML-% IV SOLN
INTRAVENOUS | Status: AC
  Filled 2018-05-19: qty 100

## 2018-05-19 MED ORDER — ONDANSETRON HCL 4 MG/2ML IJ SOLN
INTRAMUSCULAR | Status: AC
  Filled 2018-05-19: qty 2

## 2018-05-19 MED ORDER — ROCURONIUM BROMIDE 50 MG/5ML IV SOSY
PREFILLED_SYRINGE | INTRAVENOUS | Status: AC
  Filled 2018-05-19: qty 10

## 2018-05-19 MED ORDER — ONDANSETRON HCL 4 MG PO TABS
4.0000 mg | ORAL_TABLET | Freq: Four times a day (QID) | ORAL | Status: DC | PRN
  Administered 2018-05-25: 4 mg via ORAL
  Filled 2018-05-19: qty 1

## 2018-05-19 MED ORDER — LACTATED RINGERS IV SOLN: INTRAVENOUS | Status: DC

## 2018-05-19 MED ORDER — LIDOCAINE HCL (CARDIAC) PF 100 MG/5ML IV SOSY
PREFILLED_SYRINGE | INTRAVENOUS | Status: DC | PRN
  Administered 2018-05-19: 40 mg via INTRAVENOUS

## 2018-05-19 MED ORDER — BUPIVACAINE HCL (PF) 0.25 % IJ SOLN
INTRAMUSCULAR | Status: AC
  Filled 2018-05-19: qty 30

## 2018-05-19 MED ORDER — FENTANYL CITRATE (PF) 250 MCG/5ML IJ SOLN
INTRAMUSCULAR | Status: AC
  Filled 2018-05-19: qty 5

## 2018-05-19 MED ORDER — ONDANSETRON HCL 4 MG/2ML IJ SOLN: 4.0000 mg | Freq: Four times a day (QID) | INTRAMUSCULAR | Status: DC | PRN

## 2018-05-19 MED ORDER — FENTANYL CITRATE (PF) 100 MCG/2ML IJ SOLN
INTRAMUSCULAR | Status: AC
  Filled 2018-05-19: qty 2

## 2018-05-19 MED ORDER — BUPIVACAINE-EPINEPHRINE (PF) 0.5% -1:200000 IJ SOLN
INTRAMUSCULAR | Status: DC | PRN
  Administered 2018-05-19: 15 mL via PERINEURAL

## 2018-05-19 MED ORDER — ONDANSETRON HCL 4 MG/2ML IJ SOLN
INTRAMUSCULAR | Status: DC | PRN
  Administered 2018-05-19: 4 mg via INTRAVENOUS

## 2018-05-19 MED ORDER — LACTATED RINGERS IV SOLN
INTRAVENOUS | Status: DC
  Administered 2018-05-19 (×2): via INTRAVENOUS

## 2018-05-19 MED ORDER — LIDOCAINE 2% (20 MG/ML) 5 ML SYRINGE
INTRAMUSCULAR | Status: AC
  Filled 2018-05-19: qty 15

## 2018-05-19 MED ORDER — ONDANSETRON HCL 4 MG/2ML IJ SOLN: 4.0000 mg | Freq: Once | INTRAMUSCULAR | Status: DC | PRN

## 2018-05-19 MED ORDER — DOCUSATE SODIUM 100 MG PO CAPS
100.0000 mg | ORAL_CAPSULE | Freq: Two times a day (BID) | ORAL | Status: DC
  Administered 2018-05-19 – 2018-05-27 (×14): 100 mg via ORAL
  Filled 2018-05-19 (×15): qty 1

## 2018-05-19 MED ORDER — FENTANYL CITRATE (PF) 100 MCG/2ML IJ SOLN: 25.0000 ug | INTRAMUSCULAR | Status: DC | PRN

## 2018-05-19 MED ORDER — PHENOL 1.4 % MT LIQD: 1.0000 | OROMUCOSAL | Status: DC | PRN

## 2018-05-19 MED ORDER — ALBUTEROL SULFATE HFA 108 (90 BASE) MCG/ACT IN AERS
INHALATION_SPRAY | RESPIRATORY_TRACT | Status: DC | PRN
  Administered 2018-05-19: 6 via RESPIRATORY_TRACT

## 2018-05-19 MED ORDER — METOCLOPRAMIDE HCL 10 MG PO TABS: 5.0000 mg | ORAL_TABLET | Freq: Three times a day (TID) | ORAL | Status: DC | PRN

## 2018-05-19 MED ORDER — SODIUM CHLORIDE 0.9 % IR SOLN
Status: DC | PRN
  Administered 2018-05-19: 1000 mL

## 2018-05-19 MED ORDER — CEFAZOLIN SODIUM-DEXTROSE 2-4 GM/100ML-% IV SOLN
INTRAVENOUS | Status: AC
  Filled 2018-05-19: qty 100

## 2018-05-19 MED ORDER — PHENYLEPHRINE 40 MCG/ML (10ML) SYRINGE FOR IV PUSH (FOR BLOOD PRESSURE SUPPORT)
PREFILLED_SYRINGE | INTRAVENOUS | Status: AC
  Filled 2018-05-19: qty 10

## 2018-05-19 MED ORDER — SUGAMMADEX SODIUM 200 MG/2ML IV SOLN
INTRAVENOUS | Status: DC | PRN
  Administered 2018-05-19: 350 mg via INTRAVENOUS

## 2018-05-19 MED ORDER — PROMETHAZINE HCL 25 MG/ML IJ SOLN: 6.2500 mg | INTRAMUSCULAR | Status: DC | PRN

## 2018-05-19 MED ORDER — CEFAZOLIN SODIUM-DEXTROSE 2-4 GM/100ML-% IV SOLN
2.0000 g | INTRAVENOUS | Status: AC
  Administered 2018-05-19: 2 g via INTRAVENOUS

## 2018-05-19 MED ORDER — PROPOFOL 10 MG/ML IV BOLUS
INTRAVENOUS | Status: AC
  Filled 2018-05-19: qty 20

## 2018-05-19 MED ORDER — METOCLOPRAMIDE HCL 5 MG/ML IJ SOLN: 5.0000 mg | Freq: Three times a day (TID) | INTRAMUSCULAR | Status: DC | PRN

## 2018-05-19 MED ORDER — TRANEXAMIC ACID-NACL 1000-0.7 MG/100ML-% IV SOLN
1000.0000 mg | INTRAVENOUS | Status: AC
  Administered 2018-05-19: 1000 mg via INTRAVENOUS

## 2018-05-19 MED ORDER — MIDAZOLAM HCL 2 MG/2ML IJ SOLN
INTRAMUSCULAR | Status: AC
  Filled 2018-05-19: qty 2

## 2018-05-19 SURGICAL SUPPLY — 73 items
AID PSTN UNV HD RSTRNT DISP (MISCELLANEOUS) ×1
ALCOHOL 70% 16 OZ (MISCELLANEOUS) ×2 IMPLANT
BASEPLATE GLENOSPHERE 25 (Plate) ×1 IMPLANT
BEARING HUMERAL SHLDER 36M STD (Shoulder) IMPLANT
BIT DRILL 5/64X5 DISP (BIT) ×2 IMPLANT
BIT DRILL TWIST 2.7 (BIT) ×1 IMPLANT
BLADE SAG 18X100X1.27 (BLADE) ×2 IMPLANT
BRNG HUM STD 36 RVRS SHLDR (Shoulder) ×1 IMPLANT
CEMENT BONE R 1X40 (Cement) ×1 IMPLANT
COVER SURGICAL LIGHT HANDLE (MISCELLANEOUS) ×2 IMPLANT
COVER WAND RF STERILE (DRAPES) ×2 IMPLANT
DRAPE IMP U-DRAPE 54X76 (DRAPES) ×4 IMPLANT
DRAPE INCISE IOBAN 66X45 STRL (DRAPES) ×2 IMPLANT
DRAPE ORTHO SPLIT 77X108 STRL (DRAPES) ×4
DRAPE SURG 17X23 STRL (DRAPES) ×2 IMPLANT
DRAPE SURG ORHT 6 SPLT 77X108 (DRAPES) ×2 IMPLANT
DRAPE U-SHAPE 47X51 STRL (DRAPES) ×2 IMPLANT
DRSG AQUACEL AG ADV 3.5X10 (GAUZE/BANDAGES/DRESSINGS) ×2 IMPLANT
DURAPREP 26ML APPLICATOR (WOUND CARE) ×2 IMPLANT
ELECT BLADE 4.0 EZ CLEAN MEGAD (MISCELLANEOUS) ×2
ELECT REM PT RETURN 9FT ADLT (ELECTROSURGICAL) ×2
ELECTRODE BLDE 4.0 EZ CLN MEGD (MISCELLANEOUS) ×1 IMPLANT
ELECTRODE REM PT RTRN 9FT ADLT (ELECTROSURGICAL) ×1 IMPLANT
GLENOID SPHERE 36MM CVD +3 (Orthopedic Implant) ×1 IMPLANT
GLOVE BIO SURGEON STRL SZ7.5 (GLOVE) ×2 IMPLANT
GLOVE BIOGEL PI IND STRL 8 (GLOVE) ×1 IMPLANT
GLOVE BIOGEL PI INDICATOR 8 (GLOVE) ×1
GOWN STRL REUS W/ TWL LRG LVL3 (GOWN DISPOSABLE) ×1 IMPLANT
GOWN STRL REUS W/ TWL XL LVL3 (GOWN DISPOSABLE) ×1 IMPLANT
GOWN STRL REUS W/TWL LRG LVL3 (GOWN DISPOSABLE) ×2
GOWN STRL REUS W/TWL XL LVL3 (GOWN DISPOSABLE) ×2
KIT BASIN OR (CUSTOM PROCEDURE TRAY) ×2 IMPLANT
KIT TURNOVER KIT B (KITS) ×2 IMPLANT
MANIFOLD NEPTUNE II (INSTRUMENTS) ×2 IMPLANT
NDL 1/2 CIR MAYO (NEEDLE) ×1 IMPLANT
NDL HYPO 25GX1X1/2 BEV (NEEDLE) ×1 IMPLANT
NEEDLE 1/2 CIR MAYO (NEEDLE) ×2 IMPLANT
NEEDLE HYPO 25GX1X1/2 BEV (NEEDLE) ×2 IMPLANT
NS IRRIG 1000ML POUR BTL (IV SOLUTION) ×2 IMPLANT
PACK SHOULDER (CUSTOM PROCEDURE TRAY) ×2 IMPLANT
PAD ARMBOARD 7.5X6 YLW CONV (MISCELLANEOUS) ×4 IMPLANT
PIN THREADED REVERSE (PIN) ×1 IMPLANT
RESTRAINT HEAD UNIVERSAL NS (MISCELLANEOUS) ×2 IMPLANT
SCREW BONE LOCKING 4.75X30X3.5 (Screw) ×1 IMPLANT
SCREW BONE STRL 6.5MMX25MM (Screw) ×1 IMPLANT
SCREW BONE STRL 6.5MMX30MM (Screw) ×1 IMPLANT
SCREW LOCKING 4.75MMX15MM (Screw) ×3 IMPLANT
SHOULDER HUMERAL BEAR 36M STD (Shoulder) ×2 IMPLANT
SLING ARM IMMOBILIZER LRG (SOFTGOODS) IMPLANT
SLING ARM IMMOBILIZER MED (SOFTGOODS) IMPLANT
SPONGE LAP 18X18 X RAY DECT (DISPOSABLE) IMPLANT
SPONGE LAP 4X18 RFD (DISPOSABLE) ×2 IMPLANT
STEM HUMERAL STRL 12MMX122MM (Stem) ×1 IMPLANT
STRIP CLOSURE SKIN 1/2X4 (GAUZE/BANDAGES/DRESSINGS) ×2 IMPLANT
SUCTION FRAZIER HANDLE 10FR (MISCELLANEOUS) ×1
SUCTION TUBE FRAZIER 10FR DISP (MISCELLANEOUS) ×1 IMPLANT
SUT BROADBAND TAPE 2PK 1.5 (SUTURE) ×1 IMPLANT
SUT FIBERWIRE #2 38 T-5 BLUE (SUTURE) ×2
SUT MNCRL AB 4-0 PS2 18 (SUTURE) ×2 IMPLANT
SUT VIC AB 1 CT1 27 (SUTURE)
SUT VIC AB 1 CT1 27XBRD ANBCTR (SUTURE) IMPLANT
SUT VIC AB 2-0 CT1 27 (SUTURE) ×2
SUT VIC AB 2-0 CT1 TAPERPNT 27 (SUTURE) ×1 IMPLANT
SUTURE FIBERWR #2 38 T-5 BLUE (SUTURE) ×1 IMPLANT
SUTURE TAPE 1.3 40 TPR END (SUTURE) IMPLANT
SUTURETAPE 1.3 40 TPR END (SUTURE) ×2
SYR CONTROL 10ML LL (SYRINGE) ×2 IMPLANT
TOWEL OR 17X24 6PK STRL BLUE (TOWEL DISPOSABLE) ×2 IMPLANT
TOWEL OR 17X26 10 PK STRL BLUE (TOWEL DISPOSABLE) ×2 IMPLANT
TOWER CARTRIDGE SMART MIX (DISPOSABLE) IMPLANT
TRAY HUM MINI SHOULDER +0 40D (Shoulder) ×1 IMPLANT
WATER STERILE IRR 1000ML POUR (IV SOLUTION) ×2 IMPLANT
YANKAUER SUCT BULB TIP NO VENT (SUCTIONS) ×2 IMPLANT

## 2018-05-19 NOTE — Procedures (Signed)
Extubation Procedure Note  Patient Details:   Name: Justin Leach DOB: Oct 08, 1931 MRN: [id number]   Airway Documentation:   Patient extubated per orders. Patient had positive cuff leak prior to extubation. Placed on O2 by RN and RN now instructing on IS. Vent end date: 05/19/18 Vent end time: 1749   Evaluation  O2 sats: stable throughout Complications: No apparent complications Patient did tolerate procedure well. Bilateral Breath Sounds: Clear, Diminished   Yes  Ander Purpura 05/19/2018, 5:50 PM

## 2018-05-19 NOTE — Op Note (Signed)
Date: 12.31.19  PRE-OPERATIVE DIAGNOSIS: Left comminuted proximal humerus fracture  POST-OPERATIVE DIAGNOSIS: Same  PROCEDURE: 1. REVERSE SHOULDER ARTHROPLASTY  SURGEON: [name], MD  ASSISTANT: Ky Barban, RNFA Laure Kidney, RNFA  ANESTHESIA: General with a block  ESTIMATED BLOOD LOSS: 200 cc  PREOPERATIVE INDICATIONS: Justin Leach is a 84 year old left-hand-dominant male who sustained a comminuted with head splitting component left proximal humerus fracture following a fall.Due to the comminution and displaced nature of the fracture and head splitting components we discussed operative management.  We discussed moving forward with reverse shoulder arthroplasty for his injury to allow earlier weight bearing on a walker and/or cane as needed and lower risk of AVN, non union or progression of shoulder arthritis following the fracture. Thus we elected to proceed with reverse shoulder arthroplasty for the plan. The risks benefits and alternatives were discussed with the patient preoperatively including but not limited to the risks of infection, bleeding, nerve injury, cardiopulmonary complications, the need for revision surgery, dislocation, brachial plexus palsy, incomplete relief of pain, among others, and the patient was willing to proceed. The patient  provided informed consent.  OPERATIVE IMPLANTS: Biomet size 12humeral fracturestem cemented proximally with a 51mm standard standard liner and a 36 mm +3 glenosphere with a 25 mm(mini) baseplate and 4, 2.54DIYMEBR screws and one central 6.5 mm nonlocking screw.  OPERATIVE FINDINGS: Comminuted proximal humerus fracture with 3 major components but comminution noted of the greater tuberosity fragment.  The lesser tuberosity was still well attached to the humeral head.  There was a humeral head component posteriorly in the coronal plane.  The bone quality was fair.  The long head of the biceps tendon was  absent.  OPERATIVE PROCEDURE: The patient was brought to the operating room and placed in the supine position. General anesthesia was administered. IV antibiotics were given. Time out was performed. The upper extremity was prepped and draped in usual sterile fashion. The patient was in a beachchair position. Deltopectoral approach was carried out. After dissection through skin and subcutaneous fat, the cephalic vein was not identified through the approach.  This was likely transected with the fracture.  The humeral shaft was noted to be anterior and medial to the head with 150% displacement.  The humeral head was fragmented and removed from the wound. 3 traction sutures were placed in the greater tuberosity including one in the supraspinatus and 2 in the infraspinatus and teres minor component.  The subscapularis was controlled with a separate traction stitch.    I then performed circumferential releases of the humerus. I then moved to sizing the humerus. There was minimal calcar bone loss and this was used to reference the height of the stem, as was the upper border of the pectoralis major tendon. The canal was reamed and found to fit best with a 66mm fracture stem.  We next turned to the glenoid. Deep retractors were placed, and I resected the labrum as well as the residual long head of biceps, and then placed a guidepin into the center position on the glenoid, with slight inferior declination. I then reamed over the guidepin, and this created a small metaphyseal cancellus blush inferiorly, removing just the cartilage to the subchondral bone superiorly. The base plate was selected and impacted place, and then I secured it centrally with a nonlocking screw, and I had excellent purchase both inferiorly and superiorly. I placed a short locking screws on anterior aspect,  And posterior aspect.  I then turned my attention to the glenosphere,  and impacted this into place, placing slight inferior  offset.   The glenoid sphere was completely seated, and had engagement of the [facility] taper. I then turned my attention back to the humerus.   The 30mm fracture stem was seated to the appropriate height to allow approximate 5.6 cm from the top of the Pectoralis major tendon to the top of the glenosphere. Unfortunately when we attempted to press-fit the stem it had no rotational stability.  At this juncture we elected to proceed with a proximal metaphyseal cement technique.  Utilizing this, the stem was placed in 30 degrees of retroversion.  Once the stem was cemented into place we trialed poly liners. Using the standard humeral metaglen, The shoulder had excellent motion, and was stable. The final poly was impacted and again showed good motion and stability. Next,I irrigated the wounds copiously.   The greater tuberosity was brought back to the humeral stem suture holes and secured with bone graft from the humeral head as augment. The lesser tuberosity was also brought back to the stem, and the deltoid was noted to have excellent tension.  Intra-tuberosity sutures were also used between the lesser and greater tuberosity to allow for further compression onto the stem. The axillary nerve was palpated at the end of implanting, and found to be in continuity and not under undo tension.  I then irrigated the shoulder copiously once more, repaired the deltopectoral interval with Vicryl followed by subcutaneous monocryl and then subcuticular monocrylwith Steri-Strips and sterile gauze for the skin. The patient was awakened and returned back in stable and satisfactory condition. There no complications and hetolerated the procedure well. All counts were correct. The patient awakened from general anesthesia with no complications and transferred to PACU in stable condition.  Postoperative Plan: Justin Leach will remain in his sling until follow-up. The sling will be worn for sleep for 6 weeks.  He will begin shoulder PT in 1 week from discharge. He will be admitted for pain control and we will see him back in 2 weeks for a wound check. He may begin full active and passive range of motion as tolerated at the elbow, hand, and wrist.  Nonweightbearing to the left upper extremity.

## 2018-05-19 NOTE — Progress Notes (Signed)
[facility]  PROGRESS NOTE  Justin Leach NUU:[id number] DOB: 22-Dec-1931 DOA: 05/13/2018 PCP: Lavone Orn, MD   Brief HPI:   84 year old male with a history of hypertension, hyperlipidemia, diabetes mellitus, GERD, diastolic CHF, CKD stage III, chronic artery disease, paroxysmal atrial fibrillation on Coumadin, interstitial lung disease who came to the ED with a fall at home.  He struck his head at the table.  Sustained skin laceration of the left eyebrow.  Was found to have elevated INR of 3.1.  X-ray of left shoulder showed comminuted left humeral surgical neck fracture.  CT head showed bilateral subdural hematoma without midline shift.  He was given vitamin K 10 mg and Kcentra in the ED.  Neurosurgery was consulted and recommended no intervention at this time.  Orthopedics has plan to operate for his left lumbar fracture on Tuesday.   Subjective   Patient seen and examined, complains of left shoulder pain.  Plan for reverse shoulder arthroplasty today.   Assessment/Plan:    1. Subdural hematoma-CT head showed acute subdural hematoma  Left, 10 mm overlying the left frontal, temporal and parietal lobes greater than right 3 mm overlying right temporal lobe without mass effect.  No midline shift.  Neurosurgery was consulted.  No intervention recommended.  Repeat head CT on 05/14/2018 showed decreased size of bilateral acute subdural hematomas measuring up to 6 mm on the left.  2. Confusion-patient blood glucose was 51 last night that could have contributed to patient's confusion.  Patient is on Levemir 30 units in daytime and 20 units at bedtime.  Will discontinue Levemir 20 units subcu at bedtime  3. Closed comminuted left humeral fracture-orthopedics following, plan for reverse shoulder arthroplasty on Tuesday.  Continue pain management, continue sling, nonweightbearing for now.  Continue oxycodone 1 tablet every 4 hours as needed.  Plan for surgery in a.m.  4. Acute kidney injury  on CKD stage III-patient presented with creatinine of 1.66.  Baseline creatinine around 1.7-2.  Creatinine bumped to 2.9 .  Bladder scan showed no significant residual.  Started on IV normal saline.   creatinine is improved to 1.44  5. Hyperlipidemia-continue Lipitor  6. Coronary artery calcification seen on CT scan-continue Lipitor metoprolol  7. Chronic diastolic HKV-4Q echo on 5/95/6387 showed EF 65%.  No significant peripheral edema, currently CHF is compensated.  8. Paroxysmal atrial fibrillation- CHA2DS2VASc score is 6.  Coumadin is on hold.  Coumadin will be held permanently, given subdural hematoma.  Heart rate is well controlled.  Continue metoprolol  9. Diabetes mellitus type 2-hemoglobin A1c 6.3.  Continue Levemir and sliding scale insulin with NovoLog.  Blood glucose is better controlled after cutting down the dose of Levemir to 30 units subcu daily.  10. Hypertension-blood pressure is mildly elevated, lisinopril is on hold due to AKI.  Continue hydralazine PRN.  11. Hyperkalemia-resolved    CBG: Recent Labs  Lab 05/18/18 1636 05/18/18 2125 05/19/18 0757 05/19/18 1123 05/19/18 1554  GLUCAP 142* 118* 128* 114* 126*    CBC: Recent Labs  Lab 05/13/18 1937 05/15/18 0342 05/16/18 0503  WBC 19.0* 15.5* 14.1*  NEUTROABS 15.8* 11.9* 10.7*  HGB 14.6 11.3* 11.5*  HCT 46.5 36.4* 37.3*  MCV 96.1 97.6 96.9  PLT 243 199 564    Basic Metabolic Panel: Recent Labs  Lab 05/13/18 1937 05/15/18 0342 05/16/18 0503 05/18/18 0039  NA 140 138 136 136  K 5.5* 4.9 4.4 4.3  CL 98 97* 99 105  CO2 32 30 27 24   GLUCOSE 186*  194* 189* 115*  BUN 31* 50* 52* 33*  CREATININE 1.66* 2.90* 2.07* 1.44*  CALCIUM 9.6 8.6* 8.3* 8.2*     DVT prophylaxis: SCDs  Code Status: Full code  Family Communication: No family at bedside  Disposition Plan: Skilled nursing facility   Consultants:  Orthopedics  Neurosurgery  Procedures:  None   Antibiotics:   Anti-infectives  (From admission, onward)   Start     Dose/Rate Route Frequency Ordered Stop   05/19/18 1200  ceFAZolin (ANCEF) IVPB 2g/100 mL premix     2 g 200 mL/hr over 30 Minutes Intravenous On call to O.R. 05/19/18 1146 05/19/18 1325   05/19/18 1155  ceFAZolin (ANCEF) 2-4 GM/100ML-% IVPB    Note to Pharmacy:  Grace Blight   : cabinet override      05/19/18 1155 05/19/18 2359       Objective   Vitals:   05/19/18 0400 05/19/18 0700 05/19/18 1151 05/19/18 1600  BP: (!) 176/52 (!) 171/46    Pulse: 67 73    Resp: 13 12    Temp: 98 F (36.7 C) 98.4 F (36.9 C)  (!) 97.2 F (36.2 C)  TempSrc: Oral Oral    SpO2: 96% 95%  99%  Weight:   75.6 kg   Height:   5' 7.99" (1.727 m)     Intake/Output Summary (Last 24 hours) at 05/19/2018 1633 Last data filed at 05/19/2018 1601 Gross per 24 hour  Intake 700 ml  Output 1200 ml  Net -500 ml   Filed Weights   05/13/18 1905 05/19/18 1151  Weight: 72.6 kg 75.6 kg     Physical Examination:     General: Appears in no acute distress  Cardiovascular: S1-S2, regular, no murmur auscultated  Respiratory: Clear to auscultation bilaterally  Abdomen: Soft, nontender, no organomegaly  Musculoskeletal: Left shoulder in sling.      Data Reviewed: I have personally reviewed following labs and imaging studies   Recent Results (from the past 240 hour(s))  Culture, blood (Routine X 2) w Reflex to ID Panel     Status: None   Collection Time: 05/13/18 10:20 PM  Result Value Ref Range Status   Specimen Description BLOOD RIGHT HAND  Final   Special Requests   Final    BOTTLES DRAWN AEROBIC AND ANAEROBIC Blood Culture adequate volume   Culture   Final    NO GROWTH 5 DAYS Performed at [facility], [place] [address] [place], [place] [address]    Report Status 05/18/2018 FINAL  Final  Culture, blood (Routine X 2) w Reflex to ID Panel     Status: None   Collection Time: 05/13/18 10:50 PM  Result Value Ref Range Status   Specimen Description  BLOOD RIGHT HAND  Final   Special Requests   Final    BOTTLES DRAWN AEROBIC AND ANAEROBIC Blood Culture adequate volume   Culture   Final    NO GROWTH 5 DAYS Performed at [facility], [place] [address] [place], [place] [address]    Report Status 05/18/2018 FINAL  Final  MRSA PCR Screening     Status: Abnormal   Collection Time: 05/14/18  1:00 AM  Result Value Ref Range Status   MRSA by PCR POSITIVE (A) NEGATIVE Final    Comment:        The GeneXpert MRSA Assay (FDA approved for NASAL specimens only), is one component of a comprehensive MRSA colonization surveillance program. It is not intended to diagnose MRSA  infection nor to guide or monitor treatment for MRSA infections. RESULT CALLED TO, READ BACK BY AND VERIFIED WITH: Cain Saupe RN 05/14/18 0319 JDW Performed at [facility], [address], [place], [place] [address]      Liver Function Tests: Recent Labs  Lab 05/13/18 1937  AST 32  ALT 33  ALKPHOS 98  BILITOT 0.9  PROT 6.1*  ALBUMIN 3.4*   No results for input(s): LIPASE, AMYLASE in the last 168 hours. No results for input(s): AMMONIA in the last 168 hours.  Cardiac Enzymes: Recent Labs  Lab 05/13/18 1937 05/13/18 2246  CKTOTAL  --  215  TROPONINI <0.03  --    BNP (last 3 results) Recent Labs    05/13/18 2213  BNP 129.8*    ProBNP (last 3 results) No results for input(s): PROBNP in the last 8760 hours.    Studies: No results found.  Scheduled Meds: . [MAR Hold] atorvastatin  40 mg Oral QPM  . chlorhexidine  60 mL Topical Once  . [MAR Hold] cholecalciferol  1,000 Units Oral Daily  . [MAR Hold] dextrose  50 mL Intravenous Once  . [MAR Hold] hydrocortisone cream   Topical TID  . [MAR Hold] insulin aspart  0-9 Units Subcutaneous TID WC  . [MAR Hold] insulin detemir  30 Units Subcutaneous Daily  . [MAR Hold] metoprolol tartrate  50 mg Oral BID  . [MAR Hold] pantoprazole  40 mg Oral Daily  . [MAR Hold] vitamin B-12  1,000 mcg Oral  Daily    Admission status: Observation/Inpatient: Based on patients clinical presentation and evaluation of above clinical data, I have made determination that patient meets Inpatient criteria at this time.  Time spent: 60 minutes  [facility] Pager [phone]. If 7PM-7AM, please contact night-coverage at [email or web address], Office  [phone]  password [email or web address]  05/19/2018, 4:33 PM  LOS: 6 days

## 2018-05-19 NOTE — Progress Notes (Signed)
RT NOTES: RT called to PACU d/t patient being intubated from OR and needed vent. Placed patient on vent PRVC 550/16/60%/+5. Will continue to monitor. RN at bedside.

## 2018-05-19 NOTE — Anesthesia Postprocedure Evaluation (Signed)
Anesthesia Post Note  Patient: Justin Leach  Procedure(s) Performed: REVERSE SHOULDER ARTHROPLASTY (Left Shoulder)     Patient location during evaluation: PACU Anesthesia Type: General and Regional Level of consciousness: awake and alert Pain management: pain level controlled Vital Signs Assessment: post-procedure vital signs reviewed and stable Respiratory status: spontaneous breathing, nonlabored ventilation, respiratory function stable and patient connected to nasal cannula oxygen Cardiovascular status: blood pressure returned to baseline and stable Postop Assessment: no apparent nausea or vomiting Anesthetic complications: no    Last Vitals:  Vitals:   05/19/18 1810 05/19/18 1815  BP:  (!) 162/54  Pulse: (!) 59 64  Resp: (!) 32 (!) 21  Temp:    SpO2: 100% 100%    Last Pain:  Vitals:   05/19/18 1710  TempSrc:   PainSc: Asleep                 Coco Sharpnack,W. EDMOND

## 2018-05-19 NOTE — Brief Op Note (Signed)
05/13/2018 - 05/19/2018  3:17 PM  PATIENT:  Justin Leach  84 y.o. male  PRE-OPERATIVE DIAGNOSIS:  Left proximal humerus fracture  POST-OPERATIVE DIAGNOSIS:  Left proximal humerus fracture  PROCEDURE:  Procedure(s): REVERSE SHOULDER ARTHROPLASTY (Left)  SURGEON:  Surgeon(s) and Role:    * [name], MD - Primary  PHYSICIAN ASSISTANT:   ASSISTANTS: Ky Barban, RNFA Laure Kidney, RNFA   ANESTHESIA:   regional and general  EBL:  200 mL   BLOOD ADMINISTERED:none  DRAINS: none   LOCAL MEDICATIONS USED:  NONE  SPECIMEN:  No Specimen  DISPOSITION OF SPECIMEN:  N/A  COUNTS:  YES  TOURNIQUET:  * No tourniquets in log *  DICTATION: .Note written in EPIC  PLAN OF CARE: Admit to inpatient   PATIENT DISPOSITION:  PACU - hemodynamically stable.   Delay start of Pharmacological VTE agent (>24hrs) due to surgical blood loss or risk of bleeding: not applicable

## 2018-05-19 NOTE — Anesthesia Procedure Notes (Signed)
Anesthesia Regional Block: Interscalene brachial plexus block   Pre-Anesthetic Checklist: ,, timeout performed, Correct Patient, Correct Site, Correct Laterality, Correct Procedure, Correct Position, site marked, Risks and benefits discussed,  Surgical consent,  Pre-op evaluation,  At surgeon's request and post-op pain management  Laterality: Left  Prep: chloraprep       Needles:  Injection technique: Single-shot  Needle Type: Echogenic Stimulator Needle     Needle Length: 9cm  Needle Gauge: 21     Additional Needles:   Procedures:,,,, ultrasound used (permanent image in chart),,,,  Narrative:  Start time: 05/19/2018 12:40 PM End time: 05/19/2018 12:50 PM Injection made incrementally with aspirations every 5 mL.  Performed by: Personally  Anesthesiologist: Effie Berkshire, MD  Additional Notes: Patient tolerated the procedure well. Local anesthetic introduced in an incremental fashion under minimal resistance after negative aspirations. No paresthesias were elicited. After completion of the procedure, no acute issues were identified and patient continued to be monitored by RN.

## 2018-05-19 NOTE — Progress Notes (Signed)
A CBG was done before the patient was brought down for surgery. It has not transmitted to the chart. Called nurse taking care of patient on the floor and was told it was between 110 and 120. Dr. Smith Robert was made aware. Will continue to monitor patient.

## 2018-05-19 NOTE — Transfer of Care (Signed)
Immediate Anesthesia Transfer of Care Note  Patient: Justin Leach  Procedure(s) Performed: REVERSE SHOULDER ARTHROPLASTY (Left Shoulder)  Patient Location: PACU  Anesthesia Type:GA combined with regional for post-op pain  Level of Consciousness: Patient remains intubated per anesthesia plan  Airway & Oxygen Therapy: Patient remains intubated per anesthesia plan and Patient placed on Ventilator (see vital sign flow sheet for setting)  Post-op Assessment: Report given to RN, Post -op Vital signs reviewed and stable and Patient moving all extremities  Post vital signs: Reviewed and stable  Last Vitals:  Vitals Value Taken Time  BP 155/60 05/19/2018  4:01 PM  Temp    Pulse 70 05/19/2018  4:07 PM  Resp 17 05/19/2018  4:07 PM  SpO2 100 % 05/19/2018  4:07 PM  Vitals shown include unvalidated device data.  Last Pain:  Vitals:   05/19/18 0815  TempSrc:   PainSc: Asleep      Patients Stated Pain Goal: 3 (60/73/71 0626)  Complications: No apparent anesthesia complications

## 2018-05-19 NOTE — Anesthesia Preprocedure Evaluation (Addendum)
Anesthesia Evaluation  Patient identified by MRN, date of birth, ID band Patient awake    Reviewed: Allergy & Precautions, NPO status , Patient's Chart, lab work & pertinent test results, reviewed documented beta blocker date and time   Airway Mallampati: II       Dental  (+) Dental Advisory Given   Pulmonary neg pulmonary ROS,     + decreased breath sounds      Cardiovascular hypertension, Pt. on medications and Pt. on home beta blockers + CAD and +CHF  + dysrhythmias Atrial Fibrillation  Rhythm:Regular Rate:Abnormal     Neuro/Psych negative neurological ROS  negative psych ROS   GI/Hepatic Neg liver ROS, GERD  Medicated,  Endo/Other  diabetes, Type 2, Insulin Dependent  Renal/GU CRF and Renal InsufficiencyRenal disease     Musculoskeletal negative musculoskeletal ROS (+)   Abdominal Normal abdominal exam  (+) - obese,   Peds  Hematology negative hematology ROS (+)   Anesthesia Other Findings   Reproductive/Obstetrics                             Anesthesia Physical Anesthesia Plan  ASA: III  Anesthesia Plan: General   Post-op Pain Management: GA combined w/ Regional for post-op pain   Induction: Intravenous  PONV Risk Score and Plan: 3 and Ondansetron, Dexamethasone and Treatment may vary due to age or medical condition  Airway Management Planned: Oral ETT  Additional Equipment: None  Intra-op Plan:   Post-operative Plan: Possible Post-op intubation/ventilation  Informed Consent: I have reviewed the patients History and Physical, chart, labs and discussed the procedure including the risks, benefits and alternatives for the proposed anesthesia with the patient or authorized representative who has indicated his/her understanding and acceptance.   Dental advisory given  Plan Discussed with: CRNA  Anesthesia Plan Comments: (Delayed due to patient and family education. )        Anesthesia Quick Evaluation

## 2018-05-19 NOTE — Anesthesia Procedure Notes (Signed)
Procedure Name: Intubation Date/Time: 05/19/2018 1:14 PM Performed by: Marsden Zaino T, CRNA Pre-anesthesia Checklist: Patient identified, Emergency Drugs available, Suction available and Patient being monitored Patient Re-evaluated:Patient Re-evaluated prior to induction Oxygen Delivery Method: Circle system utilized Preoxygenation: Pre-oxygenation with 100% oxygen Induction Type: IV induction Ventilation: Mask ventilation without difficulty and Oral airway inserted - appropriate to patient size Laryngoscope Size: Mac and 4 Grade View: Grade I Tube type: Oral Tube size: 7.5 mm Number of attempts: 1 Airway Equipment and Method: Patient positioned with wedge pillow and Stylet Placement Confirmation: ETT inserted through vocal cords under direct vision,  positive ETCO2 and breath sounds checked- equal and bilateral Secured at: 22 cm Tube secured with: Tape Dental Injury: Teeth and Oropharynx as per pre-operative assessment

## 2018-05-19 NOTE — H&P (Signed)
H&P update  The surgical history has been reviewed and remains accurate without interval change.  The patient was re-examined and patient's physiologic condition has not changed significantly in the last 30 days. The condition still exists that makes this procedure necessary. The treatment plan remains the same, without new options for care.  No new pharmacological allergies or types of therapy has been initiated that would change the plan or the appropriateness of the plan.  The patient and/or family understand the potential benefits and risks.  Camilla Skeen P. Stann Mainland, MD 05/19/2018 12:18 PM

## 2018-05-19 NOTE — Discharge Instructions (Signed)
Orthopedic discharge instructions: -Nonweightbearing to the left upper extremity. -Okay for full range of motion as tolerated at the elbow, hand, and wrist. -Maintain sling to left arm at all times unless doing activities of daily living or bathing or getting dressed. -Please maintain your postoperative bandage until your follow-up appointment.  You may shower with this bandage in place. -Apply ice to the shoulder for 20 to 30 minutes at a time throughout each hour that you are awake. -Return to see Dr. Stann Mainland in 2 weeks for wound check.

## 2018-05-20 DIAGNOSIS — E8779 Other fluid overload: Secondary | ICD-10-CM

## 2018-05-20 DIAGNOSIS — E8770 Fluid overload, unspecified: Secondary | ICD-10-CM

## 2018-05-20 LAB — BASIC METABOLIC PANEL
Anion gap: 13 (ref 5–15)
BUN: 49 mg/dL — ABNORMAL HIGH (ref 8–23)
CO2: 16 mmol/L — ABNORMAL LOW (ref 22–32)
Calcium: 8.6 mg/dL — ABNORMAL LOW (ref 8.9–10.3)
Chloride: 106 mmol/L (ref 98–111)
Creatinine, Ser: 1.76 mg/dL — ABNORMAL HIGH (ref 0.61–1.24)
GFR calc Af Amer: 40 mL/min — ABNORMAL LOW (ref >60)
GFR, EST NON AFRICAN AMERICAN: 35 mL/min — AB (ref >60)
Glucose, Bld: 212 mg/dL — ABNORMAL HIGH (ref 70–99)
Potassium: 5.6 mmol/L — ABNORMAL HIGH (ref 3.5–5.1)
SODIUM: 135 mmol/L (ref 135–145)

## 2018-05-20 LAB — GLUCOSE, CAPILLARY
GLUCOSE-CAPILLARY: 187 mg/dL — AB (ref 70–99)
Glucose-Capillary: 194 mg/dL — ABNORMAL HIGH (ref 70–99)
Glucose-Capillary: 204 mg/dL — ABNORMAL HIGH (ref 70–99)
Glucose-Capillary: 203 mg/dL — ABNORMAL HIGH (ref 70–99)

## 2018-05-20 LAB — CBC
HCT: 34.6 % — ABNORMAL LOW (ref 39.0–52.0)
Hemoglobin: 11 g/dL — ABNORMAL LOW (ref 13.0–17.0)
MCH: 31.4 pg (ref 26.0–34.0)
MCHC: 31.8 g/dL (ref 30.0–36.0)
MCV: 98.9 fL (ref 80.0–100.0)
Platelets: 240 10*3/uL (ref 150–400)
RBC: 3.5 MIL/uL — ABNORMAL LOW (ref 4.22–5.81)
RDW: 14.9 % (ref 11.5–15.5)
WBC: 14.1 10*3/uL — AB (ref 4.0–10.5)
nRBC: 0.1 % (ref 0.0–0.2)

## 2018-05-20 MED ORDER — FUROSEMIDE 10 MG/ML IJ SOLN
40.0000 mg | Freq: Once | INTRAMUSCULAR | Status: AC
  Administered 2018-05-20: 40 mg via INTRAVENOUS
  Filled 2018-05-20: qty 4

## 2018-05-20 MED ORDER — LORAZEPAM 0.5 MG PO TABS
0.5000 mg | ORAL_TABLET | Freq: Four times a day (QID) | ORAL | Status: DC | PRN
  Administered 2018-05-20 – 2018-05-24 (×5): 0.5 mg via ORAL
  Filled 2018-05-20 (×5): qty 1

## 2018-05-20 NOTE — Social Work (Signed)
Faxed pt clinicals to [employer], pt insurance closed for holiday.  CSW continuing to follow for support with disposition when medically appropriate.  Westley Hummer, MSW, [place] Work [phone]

## 2018-05-20 NOTE — Progress Notes (Signed)
Pt repeatedly yelling out he is on the floor and has fell out the chair. Pt noted in bed each time staff answer call bell and entered pt room. Pt is yelling out his room is on fire and smoking. He is hallucinating and seeing objects move around on the wall. Pt oriented x3, disoriented to place when assessed. [facility] stated [facility]. MD page and new order obtained for Ativan 0.5mg  Q6 PRN for anxiety. MD discuss patient having postop delirium.

## 2018-05-20 NOTE — Progress Notes (Signed)
Pt called 911 x1 to tell them he has fallen. Pt has not had a fall. RN spoke with patient and reoriented pt to his surroundings.

## 2018-05-20 NOTE — Progress Notes (Signed)
PROGRESS NOTE  SHUBHAM THACKSTON HEN:[id number] DOB: 02-18-1932 DOA: 05/13/2018 PCP: Lavone Orn, MD  Brief History   85 year old man PMH atrial fibrillation on warfarin, presented after a fall at home home, resulting in close head injury, left shoulder pain and facial laceration.  Found to have bilateral traumatic subdural hematoma, reversed in the emergency department; left humeral surgical neck fracture.  Seen by neurosurgery and orthopedics.  Because of scheduling issues surgery was delayed for 5 days.  A & P  Bilateral subdural hematoma, traumatic status post fall at home, no mass-effect or shift.  INR reversed in the emergency department. --Repeat CT was reassuring.  Neurosurgery recommended follow-up in 3 weeks, roughly January 16 at which time a repeat CT scan will be done.  Will remain off Coumadin until that time. --Continue supportive care.  Comminuted left humeral fracture.  Status post reverse shoulder arthroplasty 12/31 --No weightbearing left upper extremity okay for gentle range of motion elbow, wrist, hand  Acute kidney injury superimposed on CKD stage III.  Lisinopril held. --Creatinine appears to be at baseline compared to previous studies dating back to 2014.  Of note BUN has been elevated on multiple checks since 2016  Hyperkalemia, modest --Not on any likely medications.  Of note several instances of hyperkalemia seen 2017. --Furosemide, repeat BMP in a.m.  Facial laceration repaired in the emergency department 12/25 with dissolvable sutures  Chronic diastolic CHF with volume overload presumably secondary to IV fluids. --Appears volume overloaded on exam.  +6 L since admission.   --Stop IV fluids.  Lasix has been on hold. --Start IV Lasix.  Strict I's/O.  Paroxysmal atrial fibrillation on warfarin as an outpatient --Continue metoprolol.  No warfarin until cleared by neurosurgery as an outpatient.  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  Appears to be asymptomatic.  Insulin-dependent diabetes mellitus --CBG has been stable.  Continue present management with Levemir and NovoLog sliding scale.   Continue physical therapy, will need rehab, treat hyperkalemia and repeat BMP in a.m., IV diuresis from significant volume overload, will likely need 2-3 more days of treatment.  DVT prophylaxis: SCDs Code Status: Full Family Communication: wife and sister-in-law at bedside Disposition Plan: SNF    Murray Hodgkins, MD  [facility] Direct contact: [phone] --[place]ia [place]  --[email or web address]; password [email or web address]  7PM-7AM contact night coverage as above 05/20/2018, 3:32 PM  LOS: 7 days   Consultants  . Orthopedics  Procedures  . Laceration repair to left eyebrow in the emergency department 12/25 . Reverse shoulder arthroplasty 12/31  Antibiotics  .   Interval History/Subjective  Afebrile, vital signs stable.  Status post surgery yesterday.  Somewhat confused per family.  Patient reports arm pain.  Tolerating diet.  Objective   Vitals:  Vitals:   05/20/18 0740 05/20/18 1151  BP: (!) 158/54 (!) 173/59  Pulse: 65 62  Resp:  20  Temp: 97.7 F (36.5 C) 97.9 F (36.6 C)  SpO2: 99% 100%    Exam:  Constitutional:  . Appears calm and comfortable Eyes:  . pupils and irises appear normal ENMT:  . Slightly hard of hearing Respiratory:  . CTA bilaterally, no w/r/r.  . Respiratory effort normal.  Cardiovascular:  . RRR, no m/r/g . 3+ bilateral LE extremity edema up to the scrotum which is 1+ edema. Abdomen:  . Soft, nontender, nondistended Musculoskeletal:  . Moves both hands to command.  Moves both legs to command. Psychiatric:  . Mental status o Mood, affect appropriate  I have personally reviewed the following:   Data: . +6 L since admission.   . CBG stable, no lows . Creatinine 1.76, BUN 49, both somewhat elevated compared to yesterday.  Potassium 5.6, CO2 16, glucose 212,  anion gap within normal limits. . CBC unremarkable.  Scheduled Meds: . atorvastatin  40 mg Oral QPM  . cholecalciferol  1,000 Units Oral Daily  . docusate sodium  100 mg Oral BID  . hydrocortisone cream   Topical TID  . insulin aspart  0-9 Units Subcutaneous TID WC  . insulin detemir  30 Units Subcutaneous Daily  . metoprolol tartrate  50 mg Oral BID  . pantoprazole  40 mg Oral Daily  . vitamin B-12  1,000 mcg Oral Daily   Continuous Infusions: . lactated ringers 10 mL/hr at 05/20/18 0154    Principal Problem:   SDH (subdural hematoma) (HCC) Active Problems:   Hyperlipidemia   Coronary artery calcification seen on CT scan   Chronic diastolic CHF (congestive heart failure) (HCC)   PAF (paroxysmal atrial fibrillation) (HCC)   CKD (chronic kidney disease), stage III (HCC)   Type II diabetes mellitus with renal manifestations (HCC)   Essential hypertension   GERD (gastroesophageal reflux disease)   Fall   Closed comminuted left humeral fracture   Hyperkalemia   Leukocytosis   Volume overload   LOS: 7 days

## 2018-05-20 NOTE — Progress Notes (Signed)
Justin Leach  MRN: [id number] DOB/Age: September 02, 1931 85 y.o. Physician: Ander Slade, M.D. 1 Day Post-Op Procedure(s) (LRB): REVERSE SHOULDER ARTHROPLASTY (Left)  Subjective: Patient sitting at bedside with max assist from nursing and OT. Breathing labored, O2 in place by Smith Vital Signs Temp:  [97.2 F (36.2 C)-98.8 F (37.1 C)] 97.7 F (36.5 C) (01/01 0740) Pulse Rate:  [52-79] 65 (01/01 0740) Resp:  [15-32] 20 (01/01 0400) BP: (94-172)/(42-93) 158/54 (01/01 0740) SpO2:  [95 %-100 %] 99 % (01/01 0740) FiO2 (%):  [60 %] 60 % (12/31 1710) Weight:  [75.6 kg] 75.6 kg (12/31 1151)  Lab Results Recent Labs    05/20/18 0752  WBC 14.1*  HGB 11.0*  HCT 34.6*  PLT 240   BMET Recent Labs    05/18/18 0039 05/20/18 0752  NA 136 135  K 4.3 5.6*  CL 105 106  CO2 24 16*  GLUCOSE 115* 212*  BUN 33* 49*  CREATININE 1.44* 1.76*  CALCIUM 8.2* 8.6*   INR  Date Value Ref Range Status  05/14/2018 1.13  Final    Comment:    Performed at [facility], [place] [address] [place], [place] [address]     Exam  Dressing in place L shoulder, dry, compartments soft, n/v intact distally LUE.   Plan L shoulder as expected post op from reverse arthroplasty. NWB LUE, ok for gentle rom elbow, wrist, hand Underlying multiple severe medical comorbidities, management per hospitalist service  Justin Leach 05/20/2018, 10:24 AM    Contact # [phone]

## 2018-05-20 NOTE — Progress Notes (Signed)
Occupational Therapy Treatment Patient Details Name: Justin Leach MRN: [id number] DOB: [birth year] Today's Date: 05/20/2018    History of present illness  85 year old male with s/p fall at home iwth BIL SDH 12/31 L Reverse TSA    OT comments  Pt now s/p L reverse TSA with Dr Stann Mainland and cognitive deficits noted. No family present at this to determine baseline. Pt perseverating on information but does answer his name correctly. Pt also very modest and reluctant to have male (A) for Adl. Pt tolerated EOB for ~20 minutes with (A) for balance.    Follow Up Recommendations  SNF;Supervision/Assistance - 24 hour    Equipment Recommendations  3 in 1 bedside commode;Hospital bed;Wheelchair cushion (measurements OT);Wheelchair (measurements OT);Other (comment)(hoyer)    Recommendations for Other Services      Precautions / Restrictions Precautions Precautions: Fall Precaution Comments: ROM hand wrist elbow L UE, watch oxygen Required Braces or Orthoses: Sling(6 weeks for sleeping) Restrictions Weight Bearing Restrictions: Yes LUE Weight Bearing: Non weight bearing       Mobility Bed Mobility Overal bed mobility: Needs Assistance Bed Mobility: Supine to Sit;Sit to Supine     Supine to sit: HOB elevated;Mod assist;+2 for physical assistance Sit to supine: Max assist;+2 for physical assistance   General bed mobility comments: hob 30 degrees, HOB increased so that patient can lean R but can not return to supine. pt factors R lateral lean  Transfers                 General transfer comment: unsafe to attempt this session    Balance Overall balance assessment: Needs assistance Sitting-balance support: Single extremity supported;Feet supported Sitting balance-Leahy Scale: Zero                                     ADL either performed or assessed with clinical judgement   ADL Overall ADL's : Needs assistance/impaired Eating/Feeding: Maximal assistance    Grooming: Maximal assistance   Upper Body Bathing: Maximal assistance   Lower Body Bathing: Total assistance   Upper Body Dressing : Maximal assistance   Lower Body Dressing: Total assistance     Toilet Transfer Details (indicate cue type and reason): pt unsafe to initiate sit <>stand at this time           General ADL Comments: pt perseverating on information and self talk. pt with very central vision use at this time     Vision   Additional Comments: needs to be further assessed. pt with central vision   Perception     Praxis      Cognition Arousal/Alertness: Awake/alert Behavior During Therapy: Flat affect;Anxious Overall Cognitive Status: No family/caregiver present to determine baseline cognitive functioning Area of Impairment: Memory;Awareness;Attention;Orientation;Following commands                 Orientation Level: Disoriented to;Person;Place;Time;Situation Current Attention Level: Focused Memory: Decreased recall of precautions;Decreased short-term memory Following Commands: Follows one step commands inconsistently Safety/Judgement: Decreased awareness of safety;Decreased awareness of deficits Awareness: Intellectual Problem Solving: Slow processing;Difficulty sequencing General Comments: pt continues to say "i hope she isnt mad. she didnt have a smile on her face" pt perseverating about a staff member being upset with him . pt also states multiple times "i am so embarrassed" Pt very uncomfortable with females helping with personal care        Exercises Exercises: Other exercises Other Exercises Other Exercises: pt  is total (A) for all shoulder needs at this time. pt can particapate in digit flexion   Shoulder Instructions       General Comments pt provided HGbath by tech in sitting while OT helped support at EOB> pt on 4L oxygen RR 33 and saturations in 90s    Pertinent Vitals/ Pain       Pain Assessment: Faces Faces Pain Scale: Hurts even  more Pain Location: L shoulder Pain Descriptors / Indicators: Sore;Moaning Pain Intervention(s): Limited activity within patient's tolerance;Repositioned;Ice applied  Home Living                                          Prior Functioning/Environment              Frequency  Min 3X/week        Progress Toward Goals  OT Goals(current goals can now be found in the care plan section)  Progress towards OT goals: Not progressing toward goals - comment  Acute Rehab OT Goals Patient Stated Goal: none stated- very concerned that he has upset staff OT Goal Formulation: Patient unable to participate in goal setting Time For Goal Achievement: 05/29/18 Potential to Achieve Goals: Good ADL Goals Pt Will Perform Eating: with set-up;sitting Pt Will Perform Grooming: with set-up;sitting Pt Will Perform Upper Body Bathing: with min assist;sitting Pt Will Transfer to Toilet: with max assist;with +2 assist;bedside commode;stand pivot transfer Pt/caregiver will Perform Home Exercise Program: Increased ROM;With written HEP provided;Left upper extremity  Plan Discharge plan remains appropriate;Frequency remains appropriate    Co-evaluation                 AM-PAC OT "6 Clicks" Daily Activity     Outcome Measure   Help from another person eating meals?: Total Help from another person taking care of personal grooming?: Total Help from another person toileting, which includes using toliet, bedpan, or urinal?: Total Help from another person bathing (including washing, rinsing, drying)?: Total Help from another person to put on and taking off regular upper body clothing?: Total Help from another person to put on and taking off regular lower body clothing?: Total 6 Click Score: 6    End of Session Equipment Utilized During Treatment: Oxygen  OT Visit Diagnosis: Other abnormalities of gait and mobility (R26.89);Pain Pain - Right/Left: Left Pain - part of body:  Shoulder   Activity Tolerance Patient tolerated treatment well   Patient Left in bed;with call bell/phone within reach;with bed alarm set;with nursing/sitter in room;with SCD's reapplied   Nurse Communication Mobility status;Precautions;Weight bearing status        Time: 2536-6440 OT Time Calculation (min): 39 min  Charges: OT General Charges $OT Visit: 1 Visit OT Treatments $Self Care/Home Management : 8-22 mins $Therapeutic Activity: 8-22 mins   Jeri Modena, OTR/L  Acute Rehabilitation Services Pager: [phone] Office: [phone] .    Jeri Modena 05/20/2018, 3:08 PM

## 2018-05-20 NOTE — Plan of Care (Signed)
  Problem: Education: Goal: Knowledge of General Education information will improve Description Including pain rating scale, medication(s)/side effects and non-pharmacologic comfort measures Outcome: Not Progressing   Problem: Health Behavior/Discharge Planning: Goal: Ability to manage health-related needs will improve Outcome: Not Progressing   Problem: Activity: Goal: Risk for activity intolerance will decrease Outcome: Not Progressing   Problem: Elimination: Goal: Will not experience complications related to bowel motility Outcome: Not Progressing

## 2018-05-21 ENCOUNTER — Encounter (HOSPITAL_COMMUNITY): Payer: Self-pay | Admitting: Orthopedic Surgery

## 2018-05-21 DIAGNOSIS — S4292XA Fracture of left shoulder girdle, part unspecified, initial encounter for closed fracture: Secondary | ICD-10-CM

## 2018-05-21 DIAGNOSIS — I5033 Acute on chronic diastolic (congestive) heart failure: Secondary | ICD-10-CM

## 2018-05-21 DIAGNOSIS — Z96612 Presence of left artificial shoulder joint: Secondary | ICD-10-CM

## 2018-05-21 LAB — BASIC METABOLIC PANEL
Anion gap: 6 (ref 5–15)
BUN: 51 mg/dL — AB (ref 8–23)
CO2: 25 mmol/L (ref 22–32)
Calcium: 8.4 mg/dL — ABNORMAL LOW (ref 8.9–10.3)
Chloride: 105 mmol/L (ref 98–111)
Creatinine, Ser: 1.82 mg/dL — ABNORMAL HIGH (ref 0.61–1.24)
GFR calc Af Amer: 39 mL/min — ABNORMAL LOW (ref >60)
GFR calc non Af Amer: 33 mL/min — ABNORMAL LOW (ref >60)
GLUCOSE: 181 mg/dL — AB (ref 70–99)
Potassium: 4.6 mmol/L (ref 3.5–5.1)
Sodium: 136 mmol/L (ref 135–145)

## 2018-05-21 LAB — GLUCOSE, CAPILLARY
Glucose-Capillary: 138 mg/dL — ABNORMAL HIGH (ref 70–99)
Glucose-Capillary: 165 mg/dL — ABNORMAL HIGH (ref 70–99)
Glucose-Capillary: 149 mg/dL — ABNORMAL HIGH (ref 70–99)
Glucose-Capillary: 123 mg/dL — ABNORMAL HIGH (ref 70–99)

## 2018-05-21 MED ORDER — ACETAMINOPHEN 325 MG PO TABS
650.0000 mg | ORAL_TABLET | Freq: Four times a day (QID) | ORAL | Status: DC | PRN
  Administered 2018-05-24: 650 mg via ORAL
  Filled 2018-05-21: qty 2

## 2018-05-21 MED ORDER — OXYCODONE-ACETAMINOPHEN 5-325 MG PO TABS
1.0000 | ORAL_TABLET | Freq: Four times a day (QID) | ORAL | Status: DC | PRN
  Administered 2018-05-21 – 2018-05-27 (×10): 1 via ORAL
  Filled 2018-05-21 (×10): qty 1

## 2018-05-21 MED ORDER — FUROSEMIDE 10 MG/ML IJ SOLN
40.0000 mg | Freq: Two times a day (BID) | INTRAMUSCULAR | Status: DC
  Administered 2018-05-21 – 2018-05-25 (×10): 40 mg via INTRAVENOUS
  Filled 2018-05-21 (×10): qty 4

## 2018-05-21 NOTE — Progress Notes (Signed)
Justin Leach  MRN: [id number] DOB/Age: 11/17/1931 85 y.o. Physician: Ander Slade, M.D. 2 Days Post-Op Procedure(s) (LRB): REVERSE SHOULDER ARTHROPLASTY (Left)  Subjective: Pt very sleepy and not arousable.  Per family he has had a tough day.  Vitals look WNL.  Vital Signs Temp:  [97.4 F (36.3 C)-99.1 F (37.3 C)] 98.5 F (36.9 C) (01/02 1511) Pulse Rate:  [54-72] 66 (01/02 1150) Resp:  [14-29] 20 (01/02 1511) BP: (116-167)/(43-73) 154/47 (01/02 1511) SpO2:  [97 %-100 %] 100 % (01/02 1511)  Lab Results Recent Labs    05/20/18 0752  WBC 14.1*  HGB 11.0*  HCT 34.6*  PLT 240   BMET Recent Labs    05/20/18 0752 05/21/18 0313  NA 135 136  K 5.6* 4.6  CL 106 105  CO2 16* 25  GLUCOSE 212* 181*  BUN 49* 51*  CREATININE 1.76* 1.82*  CALCIUM 8.6* 8.4*   INR  Date Value Ref Range Status  05/14/2018 1.13  Final    Comment:    Performed at [facility], [place] [address], [place], [place] [address]     Exam  Dressing in place L shoulder, dry, compartments soft, n/v intact distally LUE.   Plan L shoulder as expected post op from reverse arthroplasty. NWB LUE, ok for gentle rom elbow, wrist, hand multiple severe medical comorbidities, management per hospitalist service  Anticipate delirium will clear as meds washout, seems comfortable now.  Justin Leach 05/21/2018, 4:20 PM

## 2018-05-21 NOTE — Progress Notes (Signed)
PROGRESS NOTE  [name] OMV:[id number] DOB: 1931/05/29 DOA: 05/13/2018 PCP: Lavone Orn, Justin Leach  Brief History   85 year old man PMH atrial fibrillation on warfarin, presented after a fall at home home, resulting in close head injury, left shoulder pain and facial laceration.  Found to have bilateral traumatic subdural hematoma, reversed in the emergency department; left humeral surgical neck fracture.  Seen by neurosurgery and orthopedics.  Because of scheduling issues surgery was delayed for 5 days.  A & P  Bilateral subdural hematoma, traumatic status post fall at home, no mass-effect or shift.  INR reversed in the emergency department. --Repeat CT was reassuring.  Neurosurgery recommended follow-up in 3 weeks, roughly January 16 at which time a repeat CT scan will be done.  Will remain off Coumadin until that time. --Appears neurologically intact.    Acute delirium --Neurologic exam nonfocal.  Afebrile.  Chronic leukocytosis noted back to 2016.  Wear eyeglasses, keep blinds open during the day, reorient, minimize sedation and narcotics.  Comminuted left humeral fracture.  Status post reverse shoulder arthroplasty 12/31 --Nonweightbearing left upper extremity okay for gentle range of motion elbow, wrist, hand  Acute kidney injury superimposed on CKD stage III.  Lisinopril held. --Creatinine appears to be at baseline dating back to 2014 as does BUN which has been elevated on multiple checks since 2016.  Facial laceration repaired in the emergency department 12/25 with dissolvable sutures  Acute on chronic diastolic CHF with volume overload presumably secondary to IV fluids. --Still has significant volume overload --Twice daily Lasix, follow daily BMP  Paroxysmal atrial fibrillation on warfarin as an outpatient --Stable.  Continue metoprolol.  No warfarin until cleared by neurosurgery as an outpatient.  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   Appears to be asymptomatic.  Insulin-dependent diabetes mellitus --CBG stable.  Continue present management with Levemir and NovoLog sliding scale.   Continue physical therapy.  Needs significant reduction in massive volume overload prior to transfer to skilled nursing facility.  DVT prophylaxis: SCDs Code Status: Full Family Communication: wife and sister-in-law at bedside again 1/2 Disposition Plan: SNF    Justin Leach, Justin Leach  [facility] Direct contact: [phone] --Via amion app [place]  --[email or web address]; password [email or web address]  7PM-7AM contact night coverage as above 05/21/2018, 3:49 PM  LOS: 8 days   Consultants  . Orthopedics  Procedures  . Laceration repair to left eyebrow in the emergency department 12/25 . Reverse shoulder arthroplasty 12/31  Antibiotics  .   Interval History/Subjective  Quite confused last evening, called 911 several times.  Wife and sister-in-law at bedside, reportedly has been confused this morning.  Patient reports postsurgical pain but otherwise feels okay.  Breathing okay.  Objective   Vitals:  Vitals:   05/21/18 1150 05/21/18 1511  BP: (!) 159/59 (!) 154/47  Pulse: 66   Resp:  20  Temp: (!) 97.4 F (36.3 C) 98.5 F (36.9 C)  SpO2: 100% 100%    Exam:  Constitutional:   . Appears calm, mildly uncomfortable nontoxic.  He appears worse today short of breath Eyes:  . pupils and irises appear normal . Wears glasses ENMT:  . grossly normal hearing  Respiratory:  . CTA bilaterally, no w/r/r.  . Respiratory effort normal. Cardiovascular:  . RRR, no m/r/g . 3+ bilateral LE extremity edema   Abdomen:  . Soft, nontender Musculoskeletal:  .  RUE, LUE, RLE, LLE   . Moves all extremities to command Skin:  . Healing ecchymosis over the face noted.  Psychiatric:  . Mental status . Confused today   I have personally reviewed the following:   Data: . +6 L since admission although I's/O may not be fully accurate.  Urine output 775,  unquantified void . CBG stable . BUN and creatinine without significant change, 51, 1.82 respectively.  Potassium within normal limits.  Scheduled Meds: . atorvastatin  40 mg Oral QPM  . cholecalciferol  1,000 Units Oral Daily  . docusate sodium  100 mg Oral BID  . furosemide  40 mg Intravenous Q12H  . hydrocortisone cream   Topical TID  . insulin aspart  0-9 Units Subcutaneous TID WC  . insulin detemir  30 Units Subcutaneous Daily  . metoprolol tartrate  50 mg Oral BID  . pantoprazole  40 mg Oral Daily  . vitamin B-12  1,000 mcg Oral Daily   Continuous Infusions: . lactated ringers 10 mL/hr at 05/20/18 0154    Principal Problem:   SDH (subdural hematoma) (HCC) Active Problems:   Hyperlipidemia   Coronary artery calcification seen on CT scan   Chronic diastolic CHF (congestive heart failure) (HCC)   PAF (paroxysmal atrial fibrillation) (HCC)   CKD (chronic kidney disease), stage III (HCC)   Type II diabetes mellitus with renal manifestations (HCC)   Essential hypertension   GERD (gastroesophageal reflux disease)   Fall   Closed comminuted left humeral fracture   Hyperkalemia   Leukocytosis   Volume overload   LOS: 8 days

## 2018-05-22 LAB — BASIC METABOLIC PANEL
ANION GAP: 7 (ref 5–15)
BUN: 46 mg/dL — ABNORMAL HIGH (ref 8–23)
CO2: 29 mmol/L (ref 22–32)
Calcium: 8.8 mg/dL — ABNORMAL LOW (ref 8.9–10.3)
Chloride: 103 mmol/L (ref 98–111)
Creatinine, Ser: 1.67 mg/dL — ABNORMAL HIGH (ref 0.61–1.24)
GFR calc Af Amer: 43 mL/min — ABNORMAL LOW (ref >60)
GFR calc non Af Amer: 37 mL/min — ABNORMAL LOW (ref >60)
Glucose, Bld: 84 mg/dL (ref 70–99)
Potassium: 4.5 mmol/L (ref 3.5–5.1)
Sodium: 139 mmol/L (ref 135–145)

## 2018-05-22 LAB — GLUCOSE, CAPILLARY
GLUCOSE-CAPILLARY: 100 mg/dL — AB (ref 70–99)
Glucose-Capillary: 93 mg/dL (ref 70–99)
Glucose-Capillary: 117 mg/dL — ABNORMAL HIGH (ref 70–99)
Glucose-Capillary: 150 mg/dL — ABNORMAL HIGH (ref 70–99)

## 2018-05-22 MED ORDER — BISACODYL 10 MG RE SUPP: 10.0000 mg | Freq: Every day | RECTAL | Status: DC | PRN

## 2018-05-22 MED ORDER — POLYETHYLENE GLYCOL 3350 17 G PO PACK
17.0000 g | PACK | Freq: Two times a day (BID) | ORAL | Status: DC
  Administered 2018-05-22 (×2): 17 g via ORAL
  Filled 2018-05-22 (×2): qty 1

## 2018-05-22 MED ORDER — SENNA 8.6 MG PO TABS
1.0000 | ORAL_TABLET | Freq: Every day | ORAL | Status: DC
  Administered 2018-05-22: 8.6 mg via ORAL
  Filled 2018-05-22: qty 1

## 2018-05-22 NOTE — Social Work (Signed)
LATE ENTRY: CSW met with pt wife and pt sister in law at bedside. Pt asleep and did not arouse. Pt wife given SNF list of CMS ratings. They understand that we need a facility in order to complete insurance authorization.  CSW continuing to follow for support with disposition when medically appropriate.  Westley Hummer, MSW, [place] [employer] [phone]

## 2018-05-22 NOTE — Progress Notes (Signed)
PROGRESS NOTE  Justin Leach CHE:[id number] DOB: 1931-09-25 DOA: 05/13/2018 PCP: Lavone Orn, MD  Brief History   85 year old man PMH atrial fibrillation on warfarin, presented after a fall at home home, resulting in close head injury, left shoulder pain and facial laceration.  Found to have bilateral traumatic subdural hematoma, reversed in the emergency department; left humeral surgical neck fracture.  Seen by neurosurgery and orthopedics.  Because of scheduling issues surgery was delayed for 5 days.  Postoperatively the patient has had delirium but otherwise has done well.  He is found to have massive volume overload and currently requires aggressive IV diuresis.  Once near euvolemic anticipate transfer to skilled nursing facility.  A & P  Bilateral subdural hematoma, traumatic status post fall at home, no mass-effect or shift.  INR reversed in the emergency department. --Repeat CT was reassuring.  Neurosurgery recommended follow-up in 3 weeks, roughly January 16 at which time a repeat CT scan will be done.  Will remain off Coumadin until that time. --No new issues noted  Acute delirium --Exam remains nonfocal.  Where eyeglasses, keep blinds open during the day, reorient, minimize sedation and narcotics.  Comminuted left humeral fracture.  Status post reverse shoulder arthroplasty 12/31 --Nonweightbearing left upper extremity okay for gentle range of motion elbow, wrist, hand  Acute kidney injury superimposed on CKD stage III.  Lisinopril held. --Appears to be at baseline renal function at this point. -- Creatinine appears to be at baseline dating back to 2014 as does BUN which has been elevated on multiple checks since 2016.  Facial laceration repaired in the emergency department 12/25 with dissolvable sutures  Acute on chronic diastolic CHF with volume overload presumably secondary to IV fluids. --Slight improvement but still has significant volume overload.  Continue Lasix, check  BMP in a.m.  Paroxysmal atrial fibrillation on warfarin as an outpatient --Remained stable.  Continue metoprolol.  No warfarin until cleared by neurosurgery as an outpatient.  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  Appears to be asymptomatic.  Insulin-dependent diabetes mellitus --Blood sugars remain stable.  Continue present management with Levemir and NovoLog sliding scale.   Continue IV diuresis, likely will be ready for SNF 2-3 days.  DVT prophylaxis: SCDs Code Status: Full Family Communication: wife and sister-in-law at bedside again 1/3with Disposition Plan: SNF    Murray Hodgkins, MD  [facility] Direct contact: [phone] --Via amion app OR  --[email or web address]; password [email or web address]  7PM-7AM contact night coverage as above 05/22/2018, 12:19 PM  LOS: 9 days   Consultants  . Orthopedics  Procedures  . Laceration repair to left eyebrow in the emergency department 12/25 . Reverse shoulder arthroplasty 12/31  Antibiotics  .   Interval History/Subjective  Remains confused at times.  Complains of left arm pain  Objective   Vitals:  Vitals:   05/22/18 0401 05/22/18 1100  BP: (!) 137/54 (!) 156/62  Pulse: 65 74  Resp:  20  Temp: 98.4 F (36.9 C) (!) 97.5 F (36.4 C)  SpO2: 97% 96%    Exam:  Constitutional:   . Sleeping but easily arouses, follows commands and participates. Eyes:  . pupils and irises appear normal ENMT:  . grossly normal hearing  Respiratory:  . CTA bilaterally, no w/r/r.  . Respiratory effort normal.  Cardiovascular:  . RRR, no m/r/g . 3+ bilateral LE extremity edema up to the scrotum.  Slightly better today. Abdomen:  . Soft, nontender Musculoskeletal:  . RUE, LUE, RLE, LLE   .  Moves all extremities to command. Psychiatric:  . Mental status . Sleeping but does awaken and follow simple commands.   I have personally reviewed the following:   Data: . I/O appears unreliable, -2700 urine  output, +3.5 L since admission. . CBG unremarkable . BMP stable BUN and creatinine appears to be at baseline.  Scheduled Meds: . atorvastatin  40 mg Oral QPM  . cholecalciferol  1,000 Units Oral Daily  . docusate sodium  100 mg Oral BID  . furosemide  40 mg Intravenous Q12H  . hydrocortisone cream   Topical TID  . insulin aspart  0-9 Units Subcutaneous TID WC  . insulin detemir  30 Units Subcutaneous Daily  . metoprolol tartrate  50 mg Oral BID  . pantoprazole  40 mg Oral Daily  . polyethylene glycol  17 g Oral BID  . senna  1 tablet Oral QHS  . vitamin B-12  1,000 mcg Oral Daily   Continuous Infusions: . lactated ringers 10 mL/hr at 05/20/18 0154    Principal Problem:   SDH (subdural hematoma) (HCC) Active Problems:   Hyperlipidemia   Coronary artery calcification seen on CT scan   Chronic diastolic CHF (congestive heart failure) (HCC)   PAF (paroxysmal atrial fibrillation) (HCC)   CKD (chronic kidney disease), stage III (HCC)   Type II diabetes mellitus with renal manifestations (HCC)   Essential hypertension   GERD (gastroesophageal reflux disease)   Fall   Closed comminuted left humeral fracture   Hyperkalemia   Leukocytosis   Volume overload   LOS: 9 days

## 2018-05-22 NOTE — Progress Notes (Signed)
Physical Therapy Treatment Patient Details Name: Justin Leach MRN: [id number] DOB: 1932-03-07 Today's Date: 05/22/2018    History of Present Illness   Patient is a 85 year old male with PMHx: HTN, HLD, DM, GERD, diastolic CHF, CKD stage III, CAD, PAF, interstitial lung disease who presented with a fall at home.  Lt humeral neck fx s/p reverse total shoulder 12/31.  CT head showed bil SDH    PT Comments    Pt awake, confused with decreased strength and mobility from last session. Pt required max assist to get to EOB today and 2 person max assist to pivot to chair. Pt educated for orientation, sequence and function who continues to require extensive rehab prior to home. Recommend maximove for return to bed with nursing.     Follow Up Recommendations  SNF;Supervision/Assistance - 24 hour     Equipment Recommendations  Wheelchair (measurements PT);Hospital bed    Recommendations for Other Services       Precautions / Restrictions Precautions Precautions: Fall Precaution Comments: ROM hand wrist elbow L UE, watch oxygen Required Braces or Orthoses: Sling Restrictions LUE Weight Bearing: Non weight bearing    Mobility  Bed Mobility Overal bed mobility: Needs Assistance Bed Mobility: Supine to Sit     Supine to sit: Max assist;+2 for physical assistance     General bed mobility comments: HOb 25degrees with max assist to pivot tor right side of bed with increased time to achieve sitting balance with RUE  Transfers Overall transfer level: Needs assistance   Transfers: Sit to/from [employer] Transfers Sit to Stand: Mod assist;+2 physical assistance Stand pivot transfers: Max assist;+2 physical assistance       General transfer comment: pt able to stand with knees blocked and gait belt x 3 attempts with pt able to clear sacrum but limited ability to move feet with repeated sit to stand and partial pivots for sequential pivot bed to chair toward  right  Ambulation/Gait             General Gait Details: unable   Stairs             Wheelchair Mobility    Modified Rankin (Stroke Patients Only)       Balance Overall balance assessment: Needs assistance Sitting-balance support: Single extremity supported;Feet supported Sitting balance-Leahy Scale: Poor Sitting balance - Comments: pt EOB grossly 8 min with progression from max to min assist with RUE assist Postural control: Right lateral lean Standing balance support: Single extremity supported Standing balance-Leahy Scale: Poor Standing balance comment: RUE support on P.T. arm                            Cognition Arousal/Alertness: Awake/alert Behavior During Therapy: Flat affect Overall Cognitive Status: No family/caregiver present to determine baseline cognitive functioning Area of Impairment: Memory;Awareness;Attention;Orientation;Following commands                 Orientation Level: Disoriented to;Person;Place;Time;Situation Current Attention Level: Sustained Memory: Decreased recall of precautions;Decreased short-term memory Following Commands: Follows one step commands inconsistently Safety/Judgement: Decreased awareness of safety;Decreased awareness of deficits   Problem Solving: Slow processing;Difficulty sequencing        Exercises      General Comments        Pertinent Vitals/Pain Pain Score: 5  Pain Location: L shoulder Pain Descriptors / Indicators: Sore;Moaning Pain Intervention(s): Limited activity within patient's tolerance;Monitored during session;Repositioned    Home Living  Prior Function            PT Goals (current goals can now be found in the care plan section) Progress towards PT goals: Progressing toward goals    Frequency    Min 2X/week      PT Plan Current plan remains appropriate    Co-evaluation              AM-PAC PT "6 Clicks" Mobility    Outcome Measure  Help needed turning from your back to your side while in a flat bed without using bedrails?: A Lot Help needed moving from lying on your back to sitting on the side of a flat bed without using bedrails?: A Lot Help needed moving to and from a bed to a chair (including a wheelchair)?: A Lot Help needed standing up from a chair using your arms (e.g., wheelchair or bedside chair)?: A Lot Help needed to walk in hospital room?: Total Help needed climbing 3-5 steps with a railing? : Total 6 Click Score: 10    End of Session Equipment Utilized During Treatment: Gait belt;Other (comment)(sling) Activity Tolerance: Patient tolerated treatment well Patient left: in chair;with call bell/phone within reach;with chair alarm set;with nursing/sitter in room Nurse Communication: Need for lift equipment;Mobility status(recommend Maximove) PT Visit Diagnosis: Other abnormalities of gait and mobility (R26.89);History of falling (Z91.81);Pain;Muscle weakness (generalized) (M62.81)     Time: 5449-2010 PT Time Calculation (min) (ACUTE ONLY): 29 min  Charges:  $Therapeutic Activity: 23-37 mins                     [place], PT Acute Rehabilitation Services Pager: [phone] Office: [place] 05/22/2018, 10:37 AM

## 2018-05-22 NOTE — Social Work (Addendum)
11:53am- Confirmed pt will remain inpatient over the weekend. Lovena Le at [facility] aware. Had to cancel insurance approval, will resubmit for authorization Monday pending medical stability.  11:43am- CSW spoke with pt sister in law and wife at bedside again. Pt still lethargic, sleeping during visit. We reviewed the offers again, additional review given for [facility]. Pt family prefers [employer] Have alerted [facility], admissions at [facility] for pt preference.  Per report of sister in law pt will discharge likely Monday after he has had reduction in volume overload- will f/u with MD.  Westley Hummer, MSW, [place] Work [phone]

## 2018-05-23 ENCOUNTER — Inpatient Hospital Stay (HOSPITAL_COMMUNITY): Payer: Medicare Other

## 2018-05-23 DIAGNOSIS — S42352A Displaced comminuted fracture of shaft of humerus, left arm, initial encounter for closed fracture: Secondary | ICD-10-CM

## 2018-05-23 DIAGNOSIS — R0902 Hypoxemia: Secondary | ICD-10-CM

## 2018-05-23 LAB — BASIC METABOLIC PANEL
Anion gap: 9 (ref 5–15)
BUN: 44 mg/dL — ABNORMAL HIGH (ref 8–23)
CO2: 28 mmol/L (ref 22–32)
Calcium: 8.9 mg/dL (ref 8.9–10.3)
Chloride: 100 mmol/L (ref 98–111)
Creatinine, Ser: 1.47 mg/dL — ABNORMAL HIGH (ref 0.61–1.24)
GFR calc Af Amer: 50 mL/min — ABNORMAL LOW (ref >60)
GFR calc non Af Amer: 43 mL/min — ABNORMAL LOW (ref >60)
Glucose, Bld: 144 mg/dL — ABNORMAL HIGH (ref 70–99)
Potassium: 4.7 mmol/L (ref 3.5–5.1)
SODIUM: 137 mmol/L (ref 135–145)

## 2018-05-23 LAB — GLUCOSE, CAPILLARY
GLUCOSE-CAPILLARY: 75 mg/dL (ref 70–99)
Glucose-Capillary: 62 mg/dL — ABNORMAL LOW (ref 70–99)
Glucose-Capillary: 142 mg/dL — ABNORMAL HIGH (ref 70–99)
Glucose-Capillary: 98 mg/dL (ref 70–99)
Glucose-Capillary: 145 mg/dL — ABNORMAL HIGH (ref 70–99)

## 2018-05-23 MED ORDER — POLYETHYLENE GLYCOL 3350 17 G PO PACK: 17.0000 g | PACK | Freq: Every day | ORAL | Status: DC | PRN

## 2018-05-23 MED ORDER — HYDRALAZINE HCL 25 MG PO TABS
25.0000 mg | ORAL_TABLET | Freq: Three times a day (TID) | ORAL | Status: DC
  Administered 2018-05-23 – 2018-05-27 (×12): 25 mg via ORAL
  Filled 2018-05-23 (×13): qty 1

## 2018-05-23 MED ORDER — DEXTROSE 50 % IV SOLN
INTRAVENOUS | Status: AC
  Filled 2018-05-23: qty 50

## 2018-05-23 MED ORDER — ISOSORBIDE MONONITRATE ER 30 MG PO TB24
30.0000 mg | ORAL_TABLET | Freq: Every day | ORAL | Status: DC
  Administered 2018-05-23 – 2018-05-25 (×3): 30 mg via ORAL
  Filled 2018-05-23 (×3): qty 1

## 2018-05-23 MED ORDER — INSULIN DETEMIR 100 UNIT/ML ~~LOC~~ SOLN
26.0000 [IU] | Freq: Every day | SUBCUTANEOUS | Status: DC
  Administered 2018-05-23 – 2018-05-27 (×5): 26 [IU] via SUBCUTANEOUS
  Filled 2018-05-23 (×5): qty 0.26

## 2018-05-23 NOTE — Progress Notes (Addendum)
PROGRESS NOTE    Justin Leach   OZH:[id number]  DOB: [birth year]  DOA: 05/13/2018 PCP: Justin Orn, MD   Brief Narrative:  Justin Leach 85 year old man PMH atrial fibrillation on warfarin, presented after a fall at home home, resulting in close head injury, left shoulder pain and facial laceration.  Found to have bilateral traumatic subdural hematoma, reversed in the emergency department; left humeral surgical neck fracture.  Seen by neurosurgery and orthopedics.  Because of scheduling issues surgery was delayed for 5 days.  Postoperatively the patient has had delirium but otherwise has done well.  He is found to have massive volume overload and currently requires aggressive IV diuresis.  Once near euvolemic anticipate transfer to skilled nursing facility.   Subjective: Coughing - cannot elaborate He is cranky and does not want to talk to me.    Assessment & Plan:   Bilateral subdural hematoma, traumatic status post fall at home, no mass-effect or shift.   - INR reversed in the emergency department. --  Neurosurgery recommended follow-up in 3 weeks, roughly January 16 at which time a repeat CT scan will be done.  -  con to hold Coumadin for now   Acute delirium - possibly due to hospitalization, ? Underlying pneumonia (see below)- called 911 three times last night per RN (from his cell phone) --Exam remains nonfocal.  Where eyeglasses, keep blinds open during the day, reorient, minimize sedation and narcotics.  Acute on chronic diastolic CHF with volume overload presumably secondary to IV fluids. Acute hypoxic respiratory failure --per, Dr Justin Leach, he was volume overloaded and is receiving IV Lasix - physical exam is difficult as he is not cooperating with taking deep breaths or sitting up right  - no pedal edema today -  ordered a CXR today as he is 87% on room air- Xray today was very rotated - addendum- CXR reveiwed by me- shows effusion on right and infiltrate vs  atelectasis on the left- he has a cough- f/u CBC ordered now as his WBC count has been elevated - -  for now continue to diurese and follow up on cough tomorrow - will obtain ECHO to determine EF  HTN - SBP in 160-190 range- hold off on resuming ACE for now-  has received IV Hydralazine- will add oral Hydralazine TID and Imdur today   Comminuted left humeral fracture.   -Status post reverse shoulder arthroplasty 12/31 --Nonweightbearing left upper extremity- okay for gentle range of motion elbow, wrist, hand  Acute kidney injury superimposed on CKD stage III.  Lisinopril held. --Appears to be at baseline renal function at this point. -- Creatinine appears to be at baseline dating back to 2014 as does BUN which has been elevated on multiple checks since 2016.  Facial laceration repaired in the emergency department 12/25 with dissolvable sutures  Paroxysmal atrial fibrillation on warfarin as an outpatient --Remained stable.  Continue metoprolol.  -  No warfarin until cleared by neurosurgery as an outpatient.  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  Appears to be asymptomatic.  Insulin-dependent diabetes mellitus --Hypoglycemic this AM- will decrease Levemir from 30 to 26 U- follow - cont   NovoLog sliding scale.  Constipation - has had 3 episodes of incontinece of stool/ diarrhea due to laxatives given for constipation - change Miralax to PRN- hold Senna   Time spent in minutes: 35 DVT prophylaxis: SCDs Code Status: full code Family Communication:  Disposition Plan: f/u ECHO, Cough leukocytosis, glucose Consultants:  ortho Procedures:   Laceration repair to left eyebrow in the emergency department 12/25  Reverse shoulder arthroplasty 12/31 Antimicrobials:  Anti-infectives (From admission, onward)   Start     Dose/Rate Route Frequency Ordered Stop   05/19/18 1200  ceFAZolin (ANCEF) IVPB 2g/100 mL premix     2 g 200 mL/hr  over 30 Minutes Intravenous On call to O.R. 05/19/18 1146 05/19/18 1325   05/19/18 1155  ceFAZolin (ANCEF) 2-4 GM/100ML-% IVPB    Note to Pharmacy:  Justin Leach   : cabinet override      05/19/18 1155 05/19/18 2359       Objective: Vitals:   05/23/18 0800 05/23/18 0901 05/23/18 1200 05/23/18 1221  BP: (!) 172/63 (!) 172/63  (!) 173/60  Pulse: 66 73    Resp: (!) 37     Temp:   98.8 F (37.1 C)   TempSrc:   Oral   SpO2: 99%     Weight:      Height:        Intake/Output Summary (Last 24 hours) at 05/23/2018 1312 Last data filed at 05/23/2018 0900 Gross per 24 hour  Intake 1281 ml  Output 2700 ml  Net -1419 ml   Filed Weights   05/13/18 1905 05/19/18 1151  Weight: 72.6 kg 75.6 kg    Examination: General exam: Appears comfortable  HEENT: PERRLA, oral mucosa moist, no sclera icterus or thrush Respiratory system: Clear to auscultation listened to anteriorly. Respiratory effort normal. Cardiovascular system: S1 & S2 heard, RRR.   Gastrointestinal system: Abdomen soft, non-tender, nondistended. Normal bowel sounds. Central nervous system: Alert and oriented x 2. No focal neurological deficits. Extremities: No cyanosis, clubbing or edema Skin: No rashes or ulcers Psychiatry:  agitated    Data Reviewed: I have personally reviewed following labs and imaging studies  CBC: Recent Labs  Lab 05/20/18 0752  WBC 14.1*  HGB 11.0*  HCT 34.6*  MCV 98.9  PLT 643   Basic Metabolic Panel: Recent Labs  Lab 05/18/18 0039 05/20/18 0752 05/21/18 0313 05/22/18 0351 05/23/18 1035  NA 136 135 136 139 137  K 4.3 5.6* 4.6 4.5 4.7  CL 105 106 105 103 100  CO2 24 16* 25 29 28   GLUCOSE 115* 212* 181* 84 144*  BUN 33* 49* 51* 46* 44*  CREATININE 1.44* 1.76* 1.82* 1.67* 1.47*  CALCIUM 8.2* 8.6* 8.4* 8.8* 8.9   GFR: Estimated Creatinine Clearance: 36.2 mL/min (A) (by C-G formula based on SCr of 1.47 mg/dL (H)). Liver Function Tests: No results for input(s): AST, ALT, ALKPHOS,  BILITOT, PROT, ALBUMIN in the last 168 hours. No results for input(s): LIPASE, AMYLASE in the last 168 hours. No results for input(s): AMMONIA in the last 168 hours. Coagulation Profile: No results for input(s): INR, PROTIME in the last 168 hours. Cardiac Enzymes: No results for input(s): CKTOTAL, CKMB, CKMBINDEX, TROPONINI in the last 168 hours. BNP (last 3 results) No results for input(s): PROBNP in the last 8760 hours. HbA1C: No results for input(s): HGBA1C in the last 72 hours. CBG: Recent Labs  Lab 05/22/18 1707 05/22/18 2105 05/23/18 0730 05/23/18 0817 05/23/18 1204  GLUCAP 150* 100* 62* 75 142*   Lipid Profile: No results for input(s): CHOL, HDL, LDLCALC, TRIG, CHOLHDL, LDLDIRECT in the last 72 hours. Thyroid Function Tests: No results for input(s): TSH, T4TOTAL, FREET4, T3FREE, THYROIDAB in the last 72 hours. Anemia Panel: No results for input(s): VITAMINB12, FOLATE, FERRITIN, TIBC, IRON, RETICCTPCT in the last 72 hours. Urine analysis:  Component Value Date/Time   COLORURINE YELLOW 11/09/2016 1724   APPEARANCEUR CLEAR 11/09/2016 1724   LABSPEC 1.018 11/09/2016 1724   PHURINE 5.0 11/09/2016 1724   GLUCOSEU NEGATIVE 11/09/2016 1724   HGBUR NEGATIVE 11/09/2016 1724   BILIRUBINUR NEGATIVE 11/09/2016 1724   KETONESUR NEGATIVE 11/09/2016 1724   PROTEINUR 30 (A) 11/09/2016 1724   UROBILINOGEN 0.2 05/11/2013 1632   NITRITE NEGATIVE 11/09/2016 1724   LEUKOCYTESUR NEGATIVE 11/09/2016 1724   Sepsis Labs: @LABRCNTIP (procalcitonin:4,lacticidven:4) ) Recent Results (from the past 240 hour(s))  Culture, blood (Routine X 2) w Reflex to ID Panel     Status: None   Collection Time: 05/13/18 10:20 PM  Result Value Ref Range Status   Specimen Description BLOOD RIGHT HAND  Final   Special Requests   Final    BOTTLES DRAWN AEROBIC AND ANAEROBIC Blood Culture adequate volume   Culture   Final    NO GROWTH 5 DAYS Performed at [facility], [place] [address]  [place], [place] [address]    Report Status 05/18/2018 FINAL  Final  Culture, blood (Routine X 2) w Reflex to ID Panel     Status: None   Collection Time: 05/13/18 10:50 PM  Result Value Ref Range Status   Specimen Description BLOOD RIGHT HAND  Final   Special Requests   Final    BOTTLES DRAWN AEROBIC AND ANAEROBIC Blood Culture adequate volume   Culture   Final    NO GROWTH 5 DAYS Performed at [facility], [place] [address] [place], [place] [address]    Report Status 05/18/2018 FINAL  Final  MRSA PCR Screening     Status: Abnormal   Collection Time: 05/14/18  1:00 AM  Result Value Ref Range Status   MRSA by PCR POSITIVE (A) NEGATIVE Final    Comment:        The GeneXpert MRSA Assay (FDA approved for NASAL specimens only), is one component of a comprehensive MRSA colonization surveillance program. It is not intended to diagnose MRSA infection nor to guide or monitor treatment for MRSA infections. RESULT CALLED TO, READ BACK BY AND VERIFIED WITH: Cain Saupe RN 05/14/18 0319 JDW Performed at [facility], [address] [place], [place] [address]          Radiology Studies: Dg Chest Port 1 View  Result Date: 05/23/2018 CLINICAL DATA:  Hypoxia. EXAM: PORTABLE CHEST 1 VIEW COMPARISON:  05/13/2018 FINDINGS: Cardiac enlargement with low lung volumes. Aortic atherosclerosis. Worsening aeration to the left lung base may be due to atelectasis and/or airspace disease. Small right pleural effusion is suspected. IMPRESSION: 1. Diminished aeration to left base compared with prior exam. 2. Suspect small right pleural effusion. Electronically Signed   By: Kerby Moors M.D.   On: 05/23/2018 10:48      Scheduled Meds: . atorvastatin  40 mg Oral QPM  . cholecalciferol  1,000 Units Oral Daily  . docusate sodium  100 mg Oral BID  . furosemide  40 mg Intravenous Q12H  . hydrocortisone cream   Topical TID  . insulin aspart  0-9 Units Subcutaneous TID WC  . insulin detemir  26 Units  Subcutaneous Daily  . metoprolol tartrate  50 mg Oral BID  . pantoprazole  40 mg Oral Daily  . vitamin B-12  1,000 mcg Oral Daily   Continuous Infusions: . lactated ringers 10 mL/hr at 05/20/18 0154     LOS: 10 days      Debbe Odea, MD [facility]  Pager: [email or web address] Password [email or web address] 05/23/2018, 1:12 PM

## 2018-05-23 NOTE — Progress Notes (Signed)
Justin Leach  MRN: [id number] DOB/Age: 83-May-1933 83 y.o. Physician: Ander Slade, M.D. 4 Days Post-Op Procedure(s) (LRB): REVERSE SHOULDER ARTHROPLASTY (Left)  Subjective: Pt very sleepy.   No events overnight.  Vitals look WNL.  Vital Signs Temp:  [97.5 F (36.4 C)-99 F (37.2 C)] 99 F (37.2 C) (01/04 0700) Pulse Rate:  [61-74] 64 (01/04 0300) Resp:  [20-24] 24 (01/04 0300) BP: (138-160)/(51-62) 148/51 (01/04 0300) SpO2:  [96 %-100 %] 100 % (01/04 0700)  Lab Results No results for input(s): WBC, HGB, HCT, PLT in the last 72 hours. BMET Recent Labs    05/21/18 0313 05/22/18 0351  NA 136 139  K 4.6 4.5  CL 105 103  CO2 25 29  GLUCOSE 181* 84  BUN 51* 46*  CREATININE 1.82* 1.67*  CALCIUM 8.4* 8.8*   INR  Date Value Ref Range Status  05/14/2018 1.13  Final    Comment:    Performed at [facility], [place] [address] [place], [place] [address]     Exam  Dressing in place L shoulder, dry, compartments soft, n/v intact distally LUE.   Plan L shoulder as expected post op from reverse arthroplasty. NWB LUE, ok for gentle rom elbow, wrist, hand multiple severe medical comorbidities, management per hospitalist service  .  [name] 05/23/2018, 8:26 AM

## 2018-05-23 NOTE — Progress Notes (Signed)
Occupational Therapy Treatment Patient Details Name: Justin Leach MRN: [id number] DOB: [birth year] Today's Date: 05/23/2018    History of present illness   Patient is a 85 year old male with PMHx: HTN, HLD, DM, GERD, diastolic CHF, CKD stage III, CAD, PAF, interstitial lung disease who presented with a fall at home.  Lt humeral neck fx s/p reverse total shoulder 12/31.  CT head showed bil SDH   OT comments  Pt. Seen for skilled OT with focus on E,W,H ROM.  Required verbal and tactile cues for completion.  Very lethargic dozing in/out during session.    Follow Up Recommendations  SNF;Supervision/Assistance - 24 hour    Equipment Recommendations  3 in 1 bedside commode;Hospital bed;Wheelchair cushion (measurements OT);Wheelchair (measurements OT);Other (comment)    Recommendations for Other Services      Precautions / Restrictions Precautions Precautions: Fall Precaution Comments: ROM hand wrist elbow L UE, watch oxygen Required Braces or Orthoses: Sling Restrictions LUE Weight Bearing: Non weight bearing       Mobility Bed Mobility                  Transfers                      Balance                                           ADL either performed or assessed with clinical judgement   ADL                                               Vision       Perception     Praxis      Cognition Arousal/Alertness: Lethargic Behavior During Therapy: Flat affect Overall Cognitive Status: No family/caregiver present to determine baseline cognitive functioning Area of Impairment: Memory;Awareness;Attention;Orientation;Following commands                 Orientation Level: Disoriented to;Person;Place;Time;Situation Current Attention Level: Sustained Memory: Decreased recall of precautions;Decreased short-term memory Following Commands: Follows one step commands inconsistently Safety/Judgement: Decreased awareness  of safety;Decreased awareness of deficits Awareness: Intellectual Problem Solving: Slow processing;Difficulty sequencing General Comments: pt. talking about leaving money on the table for "her to pick up" also mentioning "a line of people out there". attempted to orient to person, place, time, situation. asked pt. withing 1-3 min. same questions and he said "to be honest i dont know". unable to state where he was, or the year without cues        Exercises Shoulder Exercises Elbow Flexion: PROM;Left;10 reps;Other (comment)(hob elevated to seated position) Elbow Extension: PROM;Left;10 reps;Other (comment)(hob elevated to seated position) Wrist Flexion: AROM;Left;10 reps;Other (comment)(hob elevated to seated position) Wrist Extension: AROM;Left;10 reps;Other (comment)(hob elevated to seated position) Digit Composite Flexion: AROM;Left;10 reps;Other (comment)(hob elevated to seated position) Composite Extension: AAROM;Left;10 reps;Other (comment)(hob elevated to seated position)   Shoulder Instructions       General Comments      Pertinent Vitals/ Pain       Pain Assessment: Faces Faces Pain Scale: Hurts little more Pain Location: LUE  Pain Descriptors / Indicators: Moaning Pain Intervention(s): Limited activity within patient's tolerance  Home Living  Prior Functioning/Environment              Frequency  Min 3X/week        Progress Toward Goals  OT Goals(current goals can now be found in the care plan section)  Progress towards OT goals: Progressing toward goals     Plan Discharge plan remains appropriate;Frequency remains appropriate    Co-evaluation                 AM-PAC OT "6 Clicks" Daily Activity     Outcome Measure   Help from another person eating meals?: Total Help from another person taking care of personal grooming?: Total Help from another person toileting, which includes using  toliet, bedpan, or urinal?: Total Help from another person bathing (including washing, rinsing, drying)?: Total Help from another person to put on and taking off regular upper body clothing?: Total Help from another person to put on and taking off regular lower body clothing?: Total 6 Click Score: 6    End of Session    OT Visit Diagnosis: Other abnormalities of gait and mobility (R26.89);Pain Pain - Right/Left: Left Pain - part of body: Shoulder   Activity Tolerance Patient tolerated treatment well   Patient Left in bed;with call bell/phone within reach   Nurse Communication Other (comment)(spoke with cna pt. thinks he was incontinent of bowels)        Time: 6314-9702 OT Time Calculation (min): 10 min  Charges: OT General Charges $OT Visit: 1 Visit OT Treatments $Self Care/Home Management : 8-22 mins   Janice Coffin, COTA/L 05/23/2018, 10:00 AM

## 2018-05-24 LAB — BASIC METABOLIC PANEL
Anion gap: 10 (ref 5–15)
BUN: 38 mg/dL — ABNORMAL HIGH (ref 8–23)
CO2: 30 mmol/L (ref 22–32)
Calcium: 9 mg/dL (ref 8.9–10.3)
Chloride: 96 mmol/L — ABNORMAL LOW (ref 98–111)
Creatinine, Ser: 1.32 mg/dL — ABNORMAL HIGH (ref 0.61–1.24)
GFR calc Af Amer: 57 mL/min — ABNORMAL LOW (ref >60)
GFR calc non Af Amer: 49 mL/min — ABNORMAL LOW (ref >60)
Glucose, Bld: 104 mg/dL — ABNORMAL HIGH (ref 70–99)
POTASSIUM: 4.2 mmol/L (ref 3.5–5.1)
Sodium: 136 mmol/L (ref 135–145)

## 2018-05-24 LAB — CBC
HCT: 32.3 % — ABNORMAL LOW (ref 39.0–52.0)
Hemoglobin: 10.6 g/dL — ABNORMAL LOW (ref 13.0–17.0)
MCH: 31.2 pg (ref 26.0–34.0)
MCHC: 32.8 g/dL (ref 30.0–36.0)
MCV: 95 fL (ref 80.0–100.0)
NRBC: 0 % (ref 0.0–0.2)
Platelets: 334 10*3/uL (ref 150–400)
RBC: 3.4 MIL/uL — ABNORMAL LOW (ref 4.22–5.81)
RDW: 14.6 % (ref 11.5–15.5)
WBC: 12.9 10*3/uL — ABNORMAL HIGH (ref 4.0–10.5)

## 2018-05-24 LAB — GLUCOSE, CAPILLARY
GLUCOSE-CAPILLARY: 116 mg/dL — AB (ref 70–99)
Glucose-Capillary: 123 mg/dL — ABNORMAL HIGH (ref 70–99)
Glucose-Capillary: 88 mg/dL (ref 70–99)
Glucose-Capillary: 146 mg/dL — ABNORMAL HIGH (ref 70–99)

## 2018-05-24 MED ORDER — HYDROCODONE-ACETAMINOPHEN 7.5-325 MG PO TABS: 1.0000 | ORAL_TABLET | Freq: Three times a day (TID) | ORAL | Status: DC | PRN

## 2018-05-24 NOTE — Progress Notes (Signed)
Subjective: 5 Days Post-Op Procedure(s) (LRB): REVERSE SHOULDER ARTHROPLASTY (Left) Patient reports pain as 3 on 0-10 scale.Very sedate but responds to questions. No woun issues and circulation in his hand is intact.    Objective: Vital signs in last 24 hours: Temp:  [98.2 F (36.8 C)-98.8 F (37.1 C)] 98.2 F (36.8 C) (01/05 0316) Pulse Rate:  [66-79] 71 (01/05 0400) Resp:  [18-30] 30 (01/05 0024) BP: (146-192)/(48-87) 146/57 (01/05 0624) SpO2:  [87 %-98 %] 94 % (01/05 0400)  Intake/Output from previous day: 01/04 0701 - 01/05 0700 In: 1653 [P.O.:822; I.V.:831] Out: 3400 [Urine:3400] Intake/Output this shift: No intake/output data recorded.  Recent Labs    05/24/18 0339  HGB 10.6*   Recent Labs    05/24/18 0339  WBC 12.9*  RBC 3.40*  HCT 32.3*  PLT 334   Recent Labs    05/23/18 1035 05/24/18 0339  NA 137 136  K 4.7 4.2  CL 100 96*  CO2 28 30  BUN 44* 38*  CREATININE 1.47* 1.32*  GLUCOSE 144* 104*  CALCIUM 8.9 9.0   No results for input(s): LABPT, INR in the last 72 hours.  Neurovascular intact No cellulitis present  No circulation issues.  Assessment/Plan: 5 Days Post-Op Procedure(s) (LRB): REVERSE SHOULDER ARTHROPLASTY (Left) Up with therapy    Latanya Maudlin 05/24/2018, 8:27 AM

## 2018-05-24 NOTE — Progress Notes (Addendum)
[facility]                                                                              Patient Demographics  Justin Leach, is a 85 y.o. male, DOB - 26-May-1931, WUX:[id number]  Admit date - 05/13/2018   Admitting Physician Ivor Costa, MD  Outpatient Primary MD for the patient is Lavone Orn, MD  Outpatient specialists:   LOS - 11  days   Medical records reviewed and are as summarized below:    Chief Complaint  Patient presents with  . Fall  . Loss of Consciousness       Brief summary   Justin Leach 85 year old man PMH atrial fibrillation on warfarin, presented after a fall at home home, resulting in close head injury, left shoulder pain and facial laceration. Found to have bilateral traumatic subdural hematoma, reversed in the emergency department; left humeral surgical neck fracture. Seen by neurosurgery and orthopedics. Because of scheduling issues surgery was delayed for 5 days.Postoperatively the patient has had delirium but otherwise has done well. He is found to have massive volume overload and currently requires aggressive IV diuresis. Once near euvolemic anticipate transfer to skilled nursing facility  Assessment & Plan    Principal Problem:   SDH (subdural hematoma) (HCC) -Bilateral subdural hematomas traumatic status post fall at home, was on anticoagulation -INR was reversed in ED, neurosurgery recommended follow-up in 3 weeks and a repeat CT scan will be done -Continue to hold Coumadin for now.  Left proximal humerus fracture -Orthopedics was consulted, patient underwent reverse shoulder arthroplasty left on 12/31 Postop day #5  Acute delirium with sundowning -Still somnolent and had received oxycodone this morning Start PT OT once more alert and oriented, avoid sedatives and narcotics  Acute on chronic diastolic CHF with volume overload, acute hypoxic respiratory failure -Currently on IV Lasix, negative balance of 1.03  L -Follow strict I's and O's and daily weights -Follow 2D echo  Essential hypertension Continue hydralazine, Imdur  Acute kidney injury superimposed on CKD stage III Continue to hold lisinopril, Appears to be at baseline, 1.3  Facial laceration Repaired in ED  Paroxysmal atrial fibrillation -Remained stable, continue metoprolol, no warfarin until cleared by neurosurgery  Insulin-dependent diabetes mellitus CBG Stable, continue SSI  Paroxysmal atrial fibrillation on warfarin as outpatient Continue metoprolol, no warfarin until cleared by neurosurgery outpatient  Interstitial lung disease Currently stable   Code Status: Full CODE STATUS DVT Prophylaxis:  SCD's Family Communication: Discussed in detail with the patient, all imaging results, lab results explained to the patient's wife at the bedside and sister   Disposition Plan: SNF likely in next 24 to 48 hours  Time Spent in minutes   35 minutes  Procedures:  None Consultants:   Neurosurgery Cardiology Neurology  Antimicrobials:      Medications  Scheduled Meds: . atorvastatin  40 mg Oral QPM  . cholecalciferol  1,000 Units Oral Daily  . docusate sodium  100 mg Oral BID  . furosemide  40 mg Intravenous Q12H  . hydrALAZINE  25 mg Oral Q8H  . hydrocortisone cream   Topical TID  . insulin aspart  0-9 Units Subcutaneous TID WC  . insulin detemir  26 Units Subcutaneous Daily  . isosorbide mononitrate  30 mg Oral Daily  . metoprolol tartrate  50 mg Oral BID  . pantoprazole  40 mg Oral Daily  . vitamin B-12  1,000 mcg Oral Daily   Continuous Infusions: . lactated ringers Stopped (05/23/18 1300)   PRN Meds:.acetaminophen, albuterol, bisacodyl, hydrALAZINE, LORazepam, menthol-cetylpyridinium **OR** phenol, methocarbamol, ondansetron **OR** ondansetron (ZOFRAN) IV, oxyCODONE-acetaminophen, polyethylene glycol, zolpidem   Antibiotics   Anti-infectives (From admission, onward)   Start     Dose/Rate Route  Frequency Ordered Stop   05/19/18 1200  ceFAZolin (ANCEF) IVPB 2g/100 mL premix     2 g 200 mL/hr over 30 Minutes Intravenous On call to O.R. 05/19/18 1146 05/19/18 1325   05/19/18 1155  ceFAZolin (ANCEF) 2-4 GM/100ML-% IVPB    Note to Pharmacy:  Grace Blight   : cabinet override      05/19/18 1155 05/19/18 2359        Subjective:   Justin Leach was seen and examined today.  Somnolent, able to withdraw legs on tactile stimuli, otherwise difficult to obtain review of system from the patient.  Wife at the bedside.    Objective:   Vitals:   05/24/18 0400 05/24/18 0624 05/24/18 0800 05/24/18 1148  BP: (!) 146/57 (!) 146/57 (!) 156/89 (!) 167/49  Pulse: 71  (!) 38 69  Resp:      Temp:   98.1 F (36.7 C) 98.2 F (36.8 C)  TempSrc:   Oral Oral  SpO2: 94%  94% 96%  Weight:      Height:        Intake/Output Summary (Last 24 hours) at 05/24/2018 1248 Last data filed at 05/24/2018 0900 Gross per 24 hour  Intake 502 ml  Output 3400 ml  Net -2898 ml     Wt Readings from Last 3 Encounters:  05/19/18 75.6 kg  04/16/16 91.4 kg  02/01/16 89.8 kg     Exam  General: Somnolent  Eyes: PERRLA, EOMI, Anicteric Sclera,  HEENT:    Cardiovascular: S1 S2 auscultated, Regular rate and rhythm.  Respiratory: Difficult to auscultate, clear anteriorly  Gastrointestinal: Soft, nontender, nondistended, + bowel sounds  Ext: no pedal edema bilaterally  Neuro: Does not follow commands  Musculoskeletal: No digital cyanosis, clubbing  Skin: No rashes  Psych: Somnolent   Data Reviewed:  I have personally reviewed following labs and imaging studies  Micro Results No results found for this or any previous visit (from the past 240 hour(s)).  Radiology Reports Dg Chest 1 View  Result Date: 05/13/2018 CLINICAL DATA:  Recent fall with left shoulder fracture, initial encounter EXAM: CHEST  1 VIEW COMPARISON:  10/30/2015 FINDINGS: Cardiac shadow is enlarged but stable. Aortic  calcifications are again seen. Lungs are well aerated bilaterally with minimal left basilar scarring. No focal infiltrate or effusion is seen. No acute bony abnormality is noted aside from the known left humeral fracture. IMPRESSION: Left humeral fracture. Left basilar scarring. Electronically Signed   By: Inez Catalina M.D.   On: 05/13/2018 20:45   Ct Head Wo Contrast  Result Date: 05/14/2018 CLINICAL DATA:  Follow up subdural hematomas. EXAM: CT HEAD WITHOUT CONTRAST TECHNIQUE: Contiguous axial images were obtained from the base of the skull through the vertex without intravenous contrast. COMPARISON:  CT HEAD May 13, 2018 FINDINGS: BRAIN: No intraparenchymal hemorrhage, mass effect nor midline shift. Moderate to severe parenchymal brain volume loss, mild sulcal effacement at the convexities  and narrowed callosum angle. Faint supratentorial white matter hypodensities less than expected for patient's age, though non-specific are most compatible with chronic small vessel ischemic disease. No acute large vascular territory infarcts. Decreased 6 mm LEFT holo hemispheric and 2 mm RIGHT fronto tentorial dense subdural hematomas. Basal cisterns are patent. VASCULAR: Moderate calcific atherosclerosis of the carotid siphons. SKULL: No skull fracture. Focal LEFT frontal scalp soft tissue swelling, possible contusion. Multifocal scalp scarring. SINUSES/ORBITS: Trace paranasal sinus mucosal thickening. Mastoid air cells are well aerated.The included ocular globes and orbital contents are non-suspicious. Status post bilateral ocular lens implants. OTHER: None. IMPRESSION: 1. Decreased size of bilateral acute subdural hematomas measuring to 6 mm on the LEFT. No midline shift. 2. Image findings of chronic communicating hydrocephalus. Electronically Signed   By: Elon Alas M.D.   On: 05/14/2018 05:29   Ct Head Wo Contrast  Result Date: 05/13/2018 CLINICAL DATA:  Patient fell at home hitting head on kitchen  table. EXAM: CT HEAD WITHOUT CONTRAST TECHNIQUE: Contiguous axial images were obtained from the base of the skull through the vertex without intravenous contrast. COMPARISON:  03/23/2006 FINDINGS: Brain: Acute left subdural hematoma measuring up to 10 mm in thickness without subfalcine shift is identified overlying the convexity of the left frontal, temporal and parietal lobes. Additional smaller right-sided subdural hematoma is identified overlying the right temporal lobe measuring up to 3 mm in thickness. No acute intraparenchymal hemorrhage. Chronic microvascular ischemia with atrophy is noted. Vascular: No hyperdense vessel sign. No acute Skull: No acute skull fracture. No suspicious osseous lesions. Sinuses/Orbits: Intact Other: Small 7 mm scalp nodule overlying the right parietal skull minimally larger than prior. IMPRESSION: 1. Acute, left greater than right subdural hematomas measuring up to 10 mm in thickness on the left overlying the left frontal, temporal and parietal lobes and up to 3 mm overlying the right temporal lobe. No midline shift. These results were called by telephone at the time of interpretation on 05/13/2018 at 8:30 pm to PA [facility] , who verbally acknowledged these results. 2. Chronic stable atrophy and microvascular ischemia. 3. No acute osseous abnormality of the bony calvarium. Electronically Signed   By: Ashley Royalty M.D.   On: 05/13/2018 20:31   Ct Shoulder Left Wo Contrast  Result Date: 05/13/2018 CLINICAL DATA:  Encounter for left shoulder fracture after fall today. EXAM: CT OF THE UPPER LEFT EXTREMITY WITHOUT CONTRAST TECHNIQUE: Multidetector CT imaging of the upper left extremity was performed according to the standard protocol. COMPARISON:  Same day radiographs of the left shoulder. FINDINGS: Bones/Joint/Cartilage There is an acute comminuted proximal left humerus fracture involving the greater tuberosity and surgical neck. One shaft with anterior displacement of the  humeral shaft relative to the humeral head is identified. The humeral head maintains its articulation the glenoid. The [facility] joint is likewise maintained. The left scapula and included left ribs appear intact. Ligaments Suboptimally assessed by CT. Muscles and Tendons Intramuscular hemorrhage or atrophy. Mild posttraumatic soft tissue induration left axilla. No significant joint effusion. Soft tissues Mild soft tissue contusion overlying the deltoid. IMPRESSION: Neer category 2 part fracture of the proximal left humerus with one shaft width anterior displacement of the humeral shaft relative to the humeral head. Nondisplaced fracture undermining the greater tuberosity is identified. No joint dislocation is seen. Electronically Signed   By: Ashley Royalty M.D.   On: 05/13/2018 22:56   Dg Chest Port 1 View  Result Date: 05/23/2018 CLINICAL DATA:  Hypoxia. EXAM: PORTABLE CHEST 1 VIEW COMPARISON:  05/13/2018 FINDINGS: Cardiac enlargement with low lung volumes. Aortic atherosclerosis. Worsening aeration to the left lung base may be due to atelectasis and/or airspace disease. Small right pleural effusion is suspected. IMPRESSION: 1. Diminished aeration to left base compared with prior exam. 2. Suspect small right pleural effusion. Electronically Signed   By: Kerby Moors M.D.   On: 05/23/2018 10:48   Dg Shoulder Left  Result Date: 05/13/2018 CLINICAL DATA:  Recent fall with shoulder pain, initial encounter EXAM: LEFT SHOULDER - 2+ VIEW COMPARISON:  None. FINDINGS: Transverse fracture is noted through the surgical neck of the humerus with displacement of the humerus somewhat medially with respect to the humeral head. Fracture lines extend into the humeral head with mild displacement of the greater tuberosity. No other focal abnormality is seen. IMPRESSION: Comminuted fracture of the proximal left humerus. The fracture involves primarily the surgical neck. Electronically Signed   By: Inez Catalina M.D.   On: 05/13/2018  20:44   Dg Shoulder Left Port  Result Date: 05/19/2018 CLINICAL DATA:  Shoulder arthroplasty EXAM: LEFT SHOULDER - 1 VIEW COMPARISON:  05/13/2018 FINDINGS: Interval left shoulder replacement with intact hardware and normal alignment. Fracture lucency within the proximal shaft of the humerus. Gas in the soft tissues consistent with recent surgery. IMPRESSION: Interval left shoulder replacement for proximal humerus fracture with expected postsurgical changes Electronically Signed   By: Donavan Foil M.D.   On: 05/19/2018 18:16    Lab Data:  CBC: Recent Labs  Lab 05/20/18 0752 05/24/18 0339  WBC 14.1* 12.9*  HGB 11.0* 10.6*  HCT 34.6* 32.3*  MCV 98.9 95.0  PLT 240 784   Basic Metabolic Panel: Recent Labs  Lab 05/20/18 0752 05/21/18 0313 05/22/18 0351 05/23/18 1035 05/24/18 0339  NA 135 136 139 137 136  K 5.6* 4.6 4.5 4.7 4.2  CL 106 105 103 100 96*  CO2 16* 25 29 28 30   GLUCOSE 212* 181* 84 144* 104*  BUN 49* 51* 46* 44* 38*  CREATININE 1.76* 1.82* 1.67* 1.47* 1.32*  CALCIUM 8.6* 8.4* 8.8* 8.9 9.0   GFR: Estimated Creatinine Clearance: 40.3 mL/min (A) (by C-G formula based on SCr of 1.32 mg/dL (H)). Liver Function Tests: No results for input(s): AST, ALT, ALKPHOS, BILITOT, PROT, ALBUMIN in the last 168 hours. No results for input(s): LIPASE, AMYLASE in the last 168 hours. No results for input(s): AMMONIA in the last 168 hours. Coagulation Profile: No results for input(s): INR, PROTIME in the last 168 hours. Cardiac Enzymes: No results for input(s): CKTOTAL, CKMB, CKMBINDEX, TROPONINI in the last 168 hours. BNP (last 3 results) No results for input(s): PROBNP in the last 8760 hours. HbA1C: No results for input(s): HGBA1C in the last 72 hours. CBG: Recent Labs  Lab 05/23/18 1204 05/23/18 1653 05/23/18 2113 05/24/18 0832 05/24/18 1131  GLUCAP 142* 98 145* 116* 123*   Lipid Profile: No results for input(s): CHOL, HDL, LDLCALC, TRIG, CHOLHDL, LDLDIRECT in the  last 72 hours. Thyroid Function Tests: No results for input(s): TSH, T4TOTAL, FREET4, T3FREE, THYROIDAB in the last 72 hours. Anemia Panel: No results for input(s): VITAMINB12, FOLATE, FERRITIN, TIBC, IRON, RETICCTPCT in the last 72 hours. Urine analysis:    Component Value Date/Time   COLORURINE YELLOW 11/09/2016 [place] 11/09/2016 1724   LABSPEC 1.018 11/09/2016 1724   PHURINE 5.0 11/09/2016 1724   GLUCOSEU NEGATIVE 11/09/2016 1724   HGBUR NEGATIVE 11/09/2016 1724   BILIRUBINUR NEGATIVE 11/09/2016 1724   [place] 11/09/2016 1724  PROTEINUR 30 (A) 11/09/2016 1724   UROBILINOGEN 0.2 05/11/2013 1632   NITRITE NEGATIVE 11/09/2016 1724   LEUKOCYTESUR NEGATIVE 11/09/2016 1724     Jakevion Arney M.D. [facility] 05/24/2018, 12:48 PM  Pager: [phone] Between 7am to 7pm - call Pager - [phone]  After 7pm go to [email or web address] - password [email or web address]  Call night coverage person covering after 7pm

## 2018-05-25 ENCOUNTER — Inpatient Hospital Stay (HOSPITAL_COMMUNITY): Payer: Medicare Other

## 2018-05-25 DIAGNOSIS — I5033 Acute on chronic diastolic (congestive) heart failure: Secondary | ICD-10-CM

## 2018-05-25 LAB — ECHOCARDIOGRAM COMPLETE
Height: 67.992 in
Weight: 2665.84 oz

## 2018-05-25 LAB — BASIC METABOLIC PANEL
Anion gap: 9 (ref 5–15)
BUN: 33 mg/dL — ABNORMAL HIGH (ref 8–23)
CO2: 34 mmol/L — AB (ref 22–32)
Calcium: 8.8 mg/dL — ABNORMAL LOW (ref 8.9–10.3)
Chloride: 90 mmol/L — ABNORMAL LOW (ref 98–111)
Creatinine, Ser: 1.44 mg/dL — ABNORMAL HIGH (ref 0.61–1.24)
GFR calc Af Amer: 51 mL/min — ABNORMAL LOW (ref >60)
GFR calc non Af Amer: 44 mL/min — ABNORMAL LOW (ref >60)
Glucose, Bld: 132 mg/dL — ABNORMAL HIGH (ref 70–99)
POTASSIUM: 3.8 mmol/L (ref 3.5–5.1)
Sodium: 133 mmol/L — ABNORMAL LOW (ref 135–145)

## 2018-05-25 LAB — GLUCOSE, CAPILLARY
Glucose-Capillary: 126 mg/dL — ABNORMAL HIGH (ref 70–99)
Glucose-Capillary: 164 mg/dL — ABNORMAL HIGH (ref 70–99)
Glucose-Capillary: 155 mg/dL — ABNORMAL HIGH (ref 70–99)
Glucose-Capillary: 117 mg/dL — ABNORMAL HIGH (ref 70–99)

## 2018-05-25 MED ORDER — PERFLUTREN LIPID MICROSPHERE
1.0000 mL | INTRAVENOUS | Status: AC | PRN
  Administered 2018-05-25: 2 mL via INTRAVENOUS
  Filled 2018-05-25: qty 10

## 2018-05-25 MED ORDER — ISOSORBIDE MONONITRATE ER 60 MG PO TB24
60.0000 mg | ORAL_TABLET | Freq: Every day | ORAL | Status: DC
  Administered 2018-05-26 – 2018-05-27 (×2): 60 mg via ORAL
  Filled 2018-05-25 (×2): qty 1

## 2018-05-25 MED ORDER — HYDRALAZINE HCL 20 MG/ML IJ SOLN: 10.0000 mg | Freq: Four times a day (QID) | INTRAMUSCULAR | Status: DC | PRN

## 2018-05-25 NOTE — Progress Notes (Signed)
  Echocardiogram 2D Echocardiogram has been performed.  Jennette Dubin 05/25/2018, 10:58 AM

## 2018-05-25 NOTE — Social Work (Signed)
Insurance was resubmitted via [employer], [employer] following for admission when authorization received.  Westley Hummer, MSW, [place] Work [phone]

## 2018-05-25 NOTE — Progress Notes (Signed)
[facility]                                                                              Patient Demographics  Justin Leach, is a 85 y.o. male, DOB - 1932/02/18, OHY:[id number]  Admit date - 05/13/2018   Admitting Physician Ivor Costa, MD  Outpatient Primary MD for the patient is Lavone Orn, MD  Outpatient specialists:   LOS - 12  days   Medical records reviewed and are as summarized below:    Chief Complaint  Patient presents with  . Fall  . Loss of Consciousness       Brief summary   Justin Leach 85 year old man PMH atrial fibrillation on warfarin, presented after a fall at home home, resulting in close head injury, left shoulder pain and facial laceration. Found to have bilateral traumatic subdural hematoma, reversed in the emergency department; left humeral surgical neck fracture. Seen by neurosurgery and orthopedics. Because of scheduling issues surgery was delayed for 5 days.Postoperatively the patient has had delirium but otherwise has done well. He is found to have massive volume overload and currently requires aggressive IV diuresis. Once near euvolemic anticipate transfer to skilled nursing facility  Assessment & Plan    Principal Problem:   SDH (subdural hematoma) (HCC) -Bilateral subdural hematomas traumatic status post fall at home, was on anticoagulation -INR was reversed in ED, neurosurgery recommended follow-up in 3 weeks and a repeat CT scan will be done -Continue to hold Coumadin for now.  Left proximal humerus fracture -Orthopedics was consulted, patient underwent reverse shoulder arthroplasty left on 12/31 -Postop day #6, continue pain control, keep fine balance between pain control and sedation.  Today alert and awake and oriented, yesterday was sedated.  Acute delirium with sundowning -Improved, patient alert and oriented today, wife and sister at the bedside, at baseline  Acute on chronic diastolic CHF with volume  overload, acute hypoxic respiratory failure -Continue IV Lasix, negative balance of 3.6 L -Follow strict I's and O's and daily weights -2D echo showed EF of 65 to 70% with grade 1 diastolic dysfunction, no regional wall motion abnormalities  Essential hypertension BP slightly on the elevated side, increase Imdur to 60 mg daily, continue hydralazine, metoprolol   Acute kidney injury superimposed on CKD stage III Continue to hold lisinopril, Creatinine 1.4, follow closely with diuresis  Facial laceration Repaired in ED  Paroxysmal atrial fibrillation -Remained stable, continue metoprolol, no warfarin until cleared by neurosurgery  Insulin-dependent diabetes mellitus CBG Stable, continue SSI  Paroxysmal atrial fibrillation on warfarin as outpatient Continue metoprolol, no warfarin until cleared by neurosurgery outpatient  Interstitial lung disease Currently stable   Code Status: Full CODE STATUS DVT Prophylaxis:  SCD's Family Communication: Discussed in detail with the patient, all imaging results, lab results explained to the patient patient and his wife and sister at the bedside   Disposition Plan: SNF when bed available  Time Spent in minutes   35 minutes  Procedures:  05/19/2018 1. REVERSE SHOULDER ARTHROPLASTY  Consultants:   Neurosurgery Cardiology Neurology  Antimicrobials:      Medications  Scheduled Meds: . atorvastatin  40 mg Oral QPM  .  cholecalciferol  1,000 Units Oral Daily  . docusate sodium  100 mg Oral BID  . furosemide  40 mg Intravenous Q12H  . hydrALAZINE  25 mg Oral Q8H  . hydrocortisone cream   Topical TID  . insulin aspart  0-9 Units Subcutaneous TID WC  . insulin detemir  26 Units Subcutaneous Daily  . [START ON 05/26/2018] isosorbide mononitrate  60 mg Oral Daily  . metoprolol tartrate  50 mg Oral BID  . pantoprazole  40 mg Oral Daily  . vitamin B-12  1,000 mcg Oral Daily   Continuous Infusions: . lactated ringers Stopped  (05/23/18 1300)   PRN Meds:.acetaminophen, albuterol, bisacodyl, hydrALAZINE, LORazepam, menthol-cetylpyridinium **OR** phenol, methocarbamol, ondansetron **OR** ondansetron (ZOFRAN) IV, oxyCODONE-acetaminophen, polyethylene glycol, zolpidem   Antibiotics   Anti-infectives (From admission, onward)   Start     Dose/Rate Route Frequency Ordered Stop   05/19/18 1200  ceFAZolin (ANCEF) IVPB 2g/100 mL premix     2 g 200 mL/hr over 30 Minutes Intravenous On call to O.R. 05/19/18 1146 05/19/18 1325   05/19/18 1155  ceFAZolin (ANCEF) 2-4 GM/100ML-% IVPB    Note to Pharmacy:  Grace Blight   : cabinet override      05/19/18 1155 05/19/18 2359        Subjective:   Justin Leach was seen and examined today.  Today alert and awake and oriented, family members at the bedside.  States pain in the left shoulder making him uncomfortable otherwise no acute issues.  Shortness of breath is improving.  No fevers.  No nausea, vomiting, abdominal pain.    Objective:   Vitals:   05/24/18 2300 05/25/18 0300 05/25/18 0745 05/25/18 1150  BP: (!) 163/60 (!) 174/65  (!) 126/52  Pulse: 67 75 (!) 30 66  Resp: (!) 21 (!) 21 19 15   Temp: 98.5 F (36.9 C) 98.4 F (36.9 C)  98.6 F (37 C)  TempSrc: Oral Oral  Axillary  SpO2: 95% 100% 100% 97%  Weight:      Height:        Intake/Output Summary (Last 24 hours) at 05/25/2018 1303 Last data filed at 05/25/2018 0745 Gross per 24 hour  Intake 564 ml  Output 3000 ml  Net -2436 ml     Wt Readings from Last 3 Encounters:  05/19/18 75.6 kg  04/16/16 91.4 kg  02/01/16 89.8 kg    Physical Exam  General: Alert and oriented x 3, NAD  Eyes:   HEENT:  Atraumatic, normocephalic  Cardiovascular: S1 S2 clear, no murmurs, RRR. No pedal edema b/l  Respiratory: Clear to auscultation bilaterally anteriorly  Gastrointestinal: Soft, nontender, nondistended, NBS  Ext: Left shoulder in sling  Neuro: no new deficits  Musculoskeletal: No cyanosis,  clubbing  Skin: No rashes  Psych: Normal affect and demeanor, alert and oriented x3     Data Reviewed:  I have personally reviewed following labs and imaging studies  Micro Results No results found for this or any previous visit (from the past 240 hour(s)).  Radiology Reports Dg Chest 1 View  Result Date: 05/13/2018 CLINICAL DATA:  Recent fall with left shoulder fracture, initial encounter EXAM: CHEST  1 VIEW COMPARISON:  10/30/2015 FINDINGS: Cardiac shadow is enlarged but stable. Aortic calcifications are again seen. Lungs are well aerated bilaterally with minimal left basilar scarring. No focal infiltrate or effusion is seen. No acute bony abnormality is noted aside from the known left humeral fracture. IMPRESSION: Left humeral fracture. Left basilar scarring. Electronically Signed   By:  Inez Catalina M.D.   On: 05/13/2018 20:45   Ct Head Wo Contrast  Result Date: 05/14/2018 CLINICAL DATA:  Follow up subdural hematomas. EXAM: CT HEAD WITHOUT CONTRAST TECHNIQUE: Contiguous axial images were obtained from the base of the skull through the vertex without intravenous contrast. COMPARISON:  CT HEAD May 13, 2018 FINDINGS: BRAIN: No intraparenchymal hemorrhage, mass effect nor midline shift. Moderate to severe parenchymal brain volume loss, mild sulcal effacement at the convexities and narrowed callosum angle. Faint supratentorial white matter hypodensities less than expected for patient's age, though non-specific are most compatible with chronic small vessel ischemic disease. No acute large vascular territory infarcts. Decreased 6 mm LEFT holo hemispheric and 2 mm RIGHT fronto tentorial dense subdural hematomas. Basal cisterns are patent. VASCULAR: Moderate calcific atherosclerosis of the carotid siphons. SKULL: No skull fracture. Focal LEFT frontal scalp soft tissue swelling, possible contusion. Multifocal scalp scarring. SINUSES/ORBITS: Trace paranasal sinus mucosal thickening. Mastoid air  cells are well aerated.The included ocular globes and orbital contents are non-suspicious. Status post bilateral ocular lens implants. OTHER: None. IMPRESSION: 1. Decreased size of bilateral acute subdural hematomas measuring to 6 mm on the LEFT. No midline shift. 2. Image findings of chronic communicating hydrocephalus. Electronically Signed   By: Elon Alas M.D.   On: 05/14/2018 05:29   Ct Head Wo Contrast  Result Date: 05/13/2018 CLINICAL DATA:  Patient fell at home hitting head on kitchen table. EXAM: CT HEAD WITHOUT CONTRAST TECHNIQUE: Contiguous axial images were obtained from the base of the skull through the vertex without intravenous contrast. COMPARISON:  03/23/2006 FINDINGS: Brain: Acute left subdural hematoma measuring up to 10 mm in thickness without subfalcine shift is identified overlying the convexity of the left frontal, temporal and parietal lobes. Additional smaller right-sided subdural hematoma is identified overlying the right temporal lobe measuring up to 3 mm in thickness. No acute intraparenchymal hemorrhage. Chronic microvascular ischemia with atrophy is noted. Vascular: No hyperdense vessel sign. No acute Skull: No acute skull fracture. No suspicious osseous lesions. Sinuses/Orbits: Intact Other: Small 7 mm scalp nodule overlying the right parietal skull minimally larger than prior. IMPRESSION: 1. Acute, left greater than right subdural hematomas measuring up to 10 mm in thickness on the left overlying the left frontal, temporal and parietal lobes and up to 3 mm overlying the right temporal lobe. No midline shift. These results were called by telephone at the time of interpretation on 05/13/2018 at 8:30 pm to PA [facility] , who verbally acknowledged these results. 2. Chronic stable atrophy and microvascular ischemia. 3. No acute osseous abnormality of the bony calvarium. Electronically Signed   By: Ashley Royalty M.D.   On: 05/13/2018 20:31   Ct Shoulder Left Wo  Contrast  Result Date: 05/13/2018 CLINICAL DATA:  Encounter for left shoulder fracture after fall today. EXAM: CT OF THE UPPER LEFT EXTREMITY WITHOUT CONTRAST TECHNIQUE: Multidetector CT imaging of the upper left extremity was performed according to the standard protocol. COMPARISON:  Same day radiographs of the left shoulder. FINDINGS: Bones/Joint/Cartilage There is an acute comminuted proximal left humerus fracture involving the greater tuberosity and surgical neck. One shaft with anterior displacement of the humeral shaft relative to the humeral head is identified. The humeral head maintains its articulation the glenoid. The [facility] joint is likewise maintained. The left scapula and included left ribs appear intact. Ligaments Suboptimally assessed by CT. Muscles and Tendons Intramuscular hemorrhage or atrophy. Mild posttraumatic soft tissue induration left axilla. No significant joint effusion. Soft tissues Mild soft  tissue contusion overlying the deltoid. IMPRESSION: Neer category 2 part fracture of the proximal left humerus with one shaft width anterior displacement of the humeral shaft relative to the humeral head. Nondisplaced fracture undermining the greater tuberosity is identified. No joint dislocation is seen. Electronically Signed   By: Ashley Royalty M.D.   On: 05/13/2018 22:56   Dg Chest Port 1 View  Result Date: 05/23/2018 CLINICAL DATA:  Hypoxia. EXAM: PORTABLE CHEST 1 VIEW COMPARISON:  05/13/2018 FINDINGS: Cardiac enlargement with low lung volumes. Aortic atherosclerosis. Worsening aeration to the left lung base may be due to atelectasis and/or airspace disease. Small right pleural effusion is suspected. IMPRESSION: 1. Diminished aeration to left base compared with prior exam. 2. Suspect small right pleural effusion. Electronically Signed   By: Kerby Moors M.D.   On: 05/23/2018 10:48   Dg Shoulder Left  Result Date: 05/13/2018 CLINICAL DATA:  Recent fall with shoulder pain, initial encounter  EXAM: LEFT SHOULDER - 2+ VIEW COMPARISON:  None. FINDINGS: Transverse fracture is noted through the surgical neck of the humerus with displacement of the humerus somewhat medially with respect to the humeral head. Fracture lines extend into the humeral head with mild displacement of the greater tuberosity. No other focal abnormality is seen. IMPRESSION: Comminuted fracture of the proximal left humerus. The fracture involves primarily the surgical neck. Electronically Signed   By: Inez Catalina M.D.   On: 05/13/2018 20:44   Dg Shoulder Left Port  Result Date: 05/19/2018 CLINICAL DATA:  Shoulder arthroplasty EXAM: LEFT SHOULDER - 1 VIEW COMPARISON:  05/13/2018 FINDINGS: Interval left shoulder replacement with intact hardware and normal alignment. Fracture lucency within the proximal shaft of the humerus. Gas in the soft tissues consistent with recent surgery. IMPRESSION: Interval left shoulder replacement for proximal humerus fracture with expected postsurgical changes Electronically Signed   By: Donavan Foil M.D.   On: 05/19/2018 18:16    Lab Data:  CBC: Recent Labs  Lab 05/20/18 0752 05/24/18 0339  WBC 14.1* 12.9*  HGB 11.0* 10.6*  HCT 34.6* 32.3*  MCV 98.9 95.0  PLT 240 035   Basic Metabolic Panel: Recent Labs  Lab 05/21/18 0313 05/22/18 0351 05/23/18 1035 05/24/18 0339 05/25/18 0608  NA 136 139 137 136 133*  K 4.6 4.5 4.7 4.2 3.8  CL 105 103 100 96* 90*  CO2 25 29 28 30  34*  GLUCOSE 181* 84 144* 104* 132*  BUN 51* 46* 44* 38* 33*  CREATININE 1.82* 1.67* 1.47* 1.32* 1.44*  CALCIUM 8.4* 8.8* 8.9 9.0 8.8*   GFR: Estimated Creatinine Clearance: 36.9 mL/min (A) (by C-G formula based on SCr of 1.44 mg/dL (H)). Liver Function Tests: No results for input(s): AST, ALT, ALKPHOS, BILITOT, PROT, ALBUMIN in the last 168 hours. No results for input(s): LIPASE, AMYLASE in the last 168 hours. No results for input(s): AMMONIA in the last 168 hours. Coagulation Profile: No results for  input(s): INR, PROTIME in the last 168 hours. Cardiac Enzymes: No results for input(s): CKTOTAL, CKMB, CKMBINDEX, TROPONINI in the last 168 hours. BNP (last 3 results) No results for input(s): PROBNP in the last 8760 hours. HbA1C: No results for input(s): HGBA1C in the last 72 hours. CBG: Recent Labs  Lab 05/24/18 1131 05/24/18 1641 05/24/18 2124 05/25/18 0802 05/25/18 1212  GLUCAP 123* 88 146* 126* 164*   Lipid Profile: No results for input(s): CHOL, HDL, LDLCALC, TRIG, CHOLHDL, LDLDIRECT in the last 72 hours. Thyroid Function Tests: No results for input(s): TSH, T4TOTAL, FREET4, T3FREE, THYROIDAB  in the last 72 hours. Anemia Panel: No results for input(s): VITAMINB12, FOLATE, FERRITIN, TIBC, IRON, RETICCTPCT in the last 72 hours. Urine analysis:    Component Value Date/Time   COLORURINE YELLOW 11/09/2016 [place] 11/09/2016 1724   LABSPEC 1.018 11/09/2016 1724   PHURINE 5.0 11/09/2016 1724   GLUCOSEU NEGATIVE 11/09/2016 1724   HGBUR NEGATIVE 11/09/2016 1724   BILIRUBINUR NEGATIVE 11/09/2016 1724   KETONESUR NEGATIVE 11/09/2016 1724   PROTEINUR 30 (A) 11/09/2016 1724   UROBILINOGEN 0.2 05/11/2013 1632   NITRITE NEGATIVE 11/09/2016 1724   LEUKOCYTESUR NEGATIVE 11/09/2016 1724     Sandor Arboleda M.D. [facility] 05/25/2018, 1:03 PM  Pager: [phone] Between 7am to 7pm - call Pager - [phone]  After 7pm go to [email or web address] - password [email or web address]  Call night coverage person covering after 7pm

## 2018-05-25 NOTE — Progress Notes (Signed)
Occupational Therapy Treatment Patient Details Name: Justin Leach MRN: [id number] DOB: Mar 22, 1932 Today's Date: 05/25/2018    History of present illness   Patient is a 85 year old male with PMHx: HTN, HLD, DM, GERD, diastolic CHF, CKD stage III, CAD, PAF, interstitial lung disease who presented with a fall at home.  Lt humeral neck fx s/p reverse total shoulder 12/31.  CT head showed bil SDH   OT comments  Patient supine in bed, asleep but easily aroused.  Session focused on L UE ROM at elbow, wrist and hand.  Patient requires cueing to maintain eyes open, reviewed exercises with noted -20 degrees in elbow extension.  Provided handout for exercises, and family engaged in hands on training for ROM and sling mgmt.  Will continue to follow.    Follow Up Recommendations  SNF;Supervision/Assistance - 24 hour    Equipment Recommendations  3 in 1 bedside commode;Hospital bed;Wheelchair cushion (measurements OT);Wheelchair (measurements OT);Other (comment)    Recommendations for Other Services      Precautions / Restrictions Precautions Precautions: Fall Precaution Comments: ROM hand wrist elbow L UE, watch oxygen Required Braces or Orthoses: Sling Restrictions Weight Bearing Restrictions: Yes LUE Weight Bearing: Non weight bearing       Mobility Bed Mobility                  Transfers                 General transfer comment: rgy     Balance                                           ADL either performed or assessed with clinical judgement   ADL Overall ADL's : Needs assistance/impaired                                       General ADL Comments: session focused on ROM exercises to L UE this session      Vision       Perception     Praxis      Cognition Arousal/Alertness: Lethargic Behavior During Therapy: Flat affect Overall Cognitive Status: Impaired/Different from baseline Area of Impairment:  Memory;Safety/judgement;Awareness;Problem solving;Following commands;Attention                   Current Attention Level: Sustained Memory: Decreased recall of precautions;Decreased short-term memory Following Commands: Follows one step commands inconsistently Safety/Judgement: Decreased awareness of safety;Decreased awareness of deficits Awareness: Intellectual Problem Solving: Slow processing;Difficulty sequencing General Comments: patient oriented, lethargic and requires cueing to maintain alertness/eyes open throughout session; family present and reports PTA he was able to manage medication         Exercises Exercises: Shoulder Shoulder Exercises Elbow Flexion: Left;10 reps;AAROM;Supine Elbow Extension: PROM;Left;10 reps;Supine Wrist Flexion: AROM;Left;10 reps;Supine Wrist Extension: AROM;Left;10 reps;Supine Digit Composite Flexion: AROM;Left;10 reps;Supine Composite Extension: Left;10 reps;Supine;AAROM;AROM(AAROM progressing to AROM )   Shoulder Instructions Shoulder Instructions Correct positioning of sling/immobilizer: Caregiver independent with task;Maximal assistance ROM for elbow, wrist and digits of operated UE: Caregiver independent with task;Minimal assistance Sling wearing schedule (on at all times/off for ADL's): Caregiver independent with task;Maximal assistance     General Comments provided handout for E/W/H exercises to family, family engaged in hands on education on exercises, sling positioning and elevation  Pertinent Vitals/ Pain       Pain Assessment: Faces Faces Pain Scale: Hurts little more Pain Location: LUE  Pain Descriptors / Indicators: Discomfort;Grimacing;Moaning Pain Intervention(s): Limited activity within patient's tolerance  Home Living                                          Prior Functioning/Environment              Frequency  Min 3X/week        Progress Toward Goals  OT Goals(current goals can  now be found in the care plan section)  Progress towards OT goals: Progressing toward goals  Acute Rehab OT Goals Patient Stated Goal: to get him better  OT Goal Formulation: With family Time For Goal Achievement: 05/29/18 Potential to Achieve Goals: Good  Plan Discharge plan remains appropriate;Frequency remains appropriate    Co-evaluation                 AM-PAC OT "6 Clicks" Daily Activity     Outcome Measure   Help from another person eating meals?: Total Help from another person taking care of personal grooming?: Total Help from another person toileting, which includes using toliet, bedpan, or urinal?: Total Help from another person bathing (including washing, rinsing, drying)?: Total Help from another person to put on and taking off regular upper body clothing?: Total Help from another person to put on and taking off regular lower body clothing?: Total 6 Click Score: 6    End of Session Equipment Utilized During Treatment: Oxygen;Other (comment)(sling)  OT Visit Diagnosis: Other abnormalities of gait and mobility (R26.89);Pain Pain - Right/Left: Left Pain - part of body: Shoulder   Activity Tolerance Patient limited by lethargy   Patient Left in bed;with call bell/phone within reach;with family/visitor present;with bed alarm set   Nurse Communication Mobility status        Time: 3710-6269 OT Time Calculation (min): 19 min  Charges: OT General Charges $OT Visit: 1 Visit OT Treatments $Self Care/Home Management : 8-22 mins  Delight Stare, [place] Pager [phone] Office [phone]     Delight Stare 05/25/2018, 2:46 PM

## 2018-05-25 NOTE — Plan of Care (Signed)
  Problem: Clinical Measurements: Goal: Ability to maintain clinical measurements within normal limits will improve Outcome: Progressing Goal: Will remain free from infection Outcome: Progressing Goal: Diagnostic test results will improve Outcome: Progressing Goal: Respiratory complications will improve Outcome: Progressing Goal: Cardiovascular complication will be avoided Outcome: Progressing   Problem: Activity: Goal: Risk for activity intolerance will decrease Outcome: Progressing   Problem: Coping: Goal: Level of anxiety will decrease Outcome: Progressing   Problem: Elimination: Goal: Will not experience complications related to bowel motility Outcome: Progressing Goal: Will not experience complications related to urinary retention Outcome: Progressing   Problem: Safety: Goal: Ability to remain free from injury will improve Outcome: Progressing   Problem: Skin Integrity: Goal: Risk for impaired skin integrity will decrease Outcome: Progressing   Problem: Education: Goal: Knowledge of General Education information will improve Description Including pain rating scale, medication(s)/side effects and non-pharmacologic comfort measures Outcome: Not Progressing Note:  Patient remains confused.  Wife at bedside and aware.   Problem: Health Behavior/Discharge Planning: Goal: Ability to manage health-related needs will improve Outcome: Not Progressing   Problem: Nutrition: Goal: Adequate nutrition will be maintained Outcome: Not Progressing Note:  Patient with very poor appetite.    Problem: Pain Managment: Goal: General experience of comfort will improve Outcome: Not Progressing Note:  Continues to complain of pain and PPN pain medications given.

## 2018-05-26 ENCOUNTER — Encounter
Admission: RE | Admit: 2018-05-26 | Discharge: 2018-05-26 | Disposition: A | Discharge status: Home / Self Care | Payer: Medicare Other | Source: Ambulatory Visit | Attending: Internal Medicine | Admitting: Internal Medicine

## 2018-05-26 LAB — AMMONIA: Ammonia: 34 umol/L (ref 9–35)

## 2018-05-26 LAB — GLUCOSE, CAPILLARY
Glucose-Capillary: 110 mg/dL — ABNORMAL HIGH (ref 70–99)
Glucose-Capillary: 175 mg/dL — ABNORMAL HIGH (ref 70–99)
Glucose-Capillary: 167 mg/dL — ABNORMAL HIGH (ref 70–99)
Glucose-Capillary: 116 mg/dL — ABNORMAL HIGH (ref 70–99)

## 2018-05-26 LAB — BASIC METABOLIC PANEL
Anion gap: 7 (ref 5–15)
BUN: 37 mg/dL — ABNORMAL HIGH (ref 8–23)
CO2: 36 mmol/L — ABNORMAL HIGH (ref 22–32)
Calcium: 8.7 mg/dL — ABNORMAL LOW (ref 8.9–10.3)
Chloride: 93 mmol/L — ABNORMAL LOW (ref 98–111)
Creatinine, Ser: 1.65 mg/dL — ABNORMAL HIGH (ref 0.61–1.24)
GFR calc Af Amer: 44 mL/min — ABNORMAL LOW (ref >60)
GFR calc non Af Amer: 38 mL/min — ABNORMAL LOW (ref >60)
GLUCOSE: 95 mg/dL (ref 70–99)
Potassium: 3.9 mmol/L (ref 3.5–5.1)
Sodium: 136 mmol/L (ref 135–145)

## 2018-05-26 MED ORDER — FUROSEMIDE 40 MG PO TABS
40.0000 mg | ORAL_TABLET | Freq: Every day | ORAL | Status: DC
  Administered 2018-05-26: 40 mg via ORAL
  Filled 2018-05-26: qty 1

## 2018-05-26 MED ORDER — SALINE SPRAY 0.65 % NA SOLN
1.0000 | NASAL | Status: DC | PRN
  Administered 2018-05-26: 1 via NASAL
  Filled 2018-05-26: qty 44

## 2018-05-26 NOTE — Progress Notes (Addendum)
Physical Therapy Treatment Patient Details Name: Justin Leach MRN: [id number] DOB: May 16, 1932 Today's Date: 05/26/2018    History of Present Illness   Patient is a 85 year old male with PMHx: HTN, HLD, DM, GERD, diastolic CHF, CKD stage III, CAD, PAF, interstitial lung disease who presented with a fall at home.  Lt humeral neck fx s/p reverse total shoulder 12/31.  CT head showed bil SDH    PT Comments    Pt with increased confusion, lack of orientation and asking why "wife is doing this to me". Pt with continued lack of initiation for mobility but improved alertness once EOB. Pt with greatly improved standing tolerance and transfers with use of Stedy. Pt stood fully x 4 trials with stedy for grossly 30-73min. Pt requires cues and encouragement in standing to maintain. Pt with rounded shoulders and cervical flexion throughout with inability to significantly lift head. Pt educated for bil LE in chair with pt able to perform with multimodal cueing. Wife present beginning and end of session.   94% on 3L  HR 110    Follow Up Recommendations  SNF;Supervision/Assistance - 24 hour     Equipment Recommendations  Wheelchair (measurements PT);Hospital bed    Recommendations for Other Services       Precautions / Restrictions Precautions Precautions: Fall Precaution Comments: ROM hand wrist elbow L UE, watch oxygen Required Braces or Orthoses: Sling Restrictions Weight Bearing Restrictions: Yes LUE Weight Bearing: Non weight bearing    Mobility  Bed Mobility Overal bed mobility: Needs Assistance Bed Mobility: Supine to Sit     Supine to sit: Max assist;+2 for physical assistance     General bed mobility comments: HOb 40 degrees with max +2 assist to pivot to right with use of pad and max cues for sequence. Once EOB pt maintaining balance with RUE without physical assist  Transfers Overall transfer level: Needs assistance   Transfers: Sit to/from Stand Sit to Stand: Mod  assist;+2 physical assistance Stand pivot transfers: Mod assist;+2 physical assistance       General transfer comment: pt stood from elevated bed with stedy with RUE with max cues for anterior translation. STand and pivot bed>BSC>chair all with stedy with increased time for transition and max cues. Greatly improved mobility with use of stedy  Ambulation/Gait             General Gait Details: unable   Stairs             Wheelchair Mobility    Modified Rankin (Stroke Patients Only)       Balance Overall balance assessment: Needs assistance Sitting-balance support: Single extremity supported;Feet supported Sitting balance-Leahy Scale: Fair Sitting balance - Comments: EOb with minguard assist   Standing balance support: Single extremity supported Standing balance-Leahy Scale: Poor                              Cognition Arousal/Alertness: Lethargic Behavior During Therapy: Flat affect Overall Cognitive Status: Impaired/Different from baseline Area of Impairment: Memory;Safety/judgement;Awareness;Problem solving;Following commands;Attention;Orientation                 Orientation Level: Disoriented to;Place;Time;Situation Current Attention Level: Focused Memory: Decreased recall of precautions;Decreased short-term memory Following Commands: Follows one step commands inconsistently;Follows one step commands with increased time Safety/Judgement: Decreased awareness of safety;Decreased awareness of deficits   Problem Solving: Slow processing;Difficulty sequencing General Comments: pt not oriented, confused and asking "why is my wife doing this to me" "if  you had any sympathy you'd let me go". Pt able to follow single step commands once sitting EOB      Exercises General Exercises - Lower Extremity Long Arc Quad: AROM;5 reps;Seated;Both    General Comments        Pertinent Vitals/Pain Pain Assessment: Faces Faces Pain Scale: Hurts even  more Pain Location: LUE with movement Pain Descriptors / Indicators: Discomfort;Grimacing;Moaning Pain Intervention(s): Limited activity within patient's tolerance;Premedicated before session;Monitored during session;Repositioned    Home Living                      Prior Function            PT Goals (current goals can now be found in the care plan section) Acute Rehab PT Goals Time For Goal Achievement: 06/09/18 Progress towards PT goals: Progressing toward goals(slowly with goals appropriate)    Frequency    Min 2X/week      PT Plan Current plan remains appropriate    Co-evaluation              AM-PAC PT "6 Clicks" Mobility   Outcome Measure  Help needed turning from your back to your side while in a flat bed without using bedrails?: A Lot Help needed moving from lying on your back to sitting on the side of a flat bed without using bedrails?: A Lot Help needed moving to and from a bed to a chair (including a wheelchair)?: A Lot Help needed standing up from a chair using your arms (e.g., wheelchair or bedside chair)?: A Lot Help needed to walk in hospital room?: Total Help needed climbing 3-5 steps with a railing? : Total 6 Click Score: 10    End of Session Equipment Utilized During Treatment: Gait belt;Oxygen Activity Tolerance: Patient tolerated treatment well Patient left: in chair;with chair alarm set;with call bell/phone within reach Nurse Communication: Need for lift equipment;Mobility status(Stedy) PT Visit Diagnosis: Other abnormalities of gait and mobility (R26.89);History of falling (Z91.81);Pain;Muscle weakness (generalized) (M62.81)     Time: 1010-1050 PT Time Calculation (min) (ACUTE ONLY): 40 min  Charges:  $Therapeutic Exercise: 8-22 mins $Therapeutic Activity: 23-37 mins                     Jetaime Pinnix Pam Drown, PT Acute Rehabilitation Services Pager: [phone] Office: [place] 05/26/2018, 11:41  AM

## 2018-05-26 NOTE — Social Work (Signed)
Authorization received, however patient not medically appropriate for d/c today.  [id number] 3 days with review on 05/28/18 PT/OT TM SLP SG RN CDE1 NTA NF   CSW continuing to follow for support with disposition when medically appropriate.  Westley Hummer, MSW, [place] Work [phone]

## 2018-05-26 NOTE — Progress Notes (Signed)
[facility]                                                                              Patient Demographics  Justin Leach, is a 85 y.o. male, DOB - 10-08-1931, WER:[id number]  Admit date - 05/13/2018   Admitting Physician Ivor Costa, MD  Outpatient Primary MD for the patient is Lavone Orn, MD  Outpatient specialists:   LOS - 13  days   Medical records reviewed and are as summarized below:    Chief Complaint  Patient presents with  . Fall  . Loss of Consciousness       Brief summary   Justin Leach 85 year old man PMH atrial fibrillation on warfarin, presented after a fall at home home, resulting in close head injury, left shoulder pain and facial laceration. Found to have bilateral traumatic subdural hematoma, reversed in the emergency department; left humeral surgical neck fracture. Seen by neurosurgery and orthopedics. Because of scheduling issues surgery was delayed for 5 days.Postoperatively the patient has had delirium but otherwise has done well. He is found to have massive volume overload and currently requires aggressive IV diuresis. Once near euvolemic anticipate transfer to skilled nursing facility  Assessment & Plan    Principal Problem:   SDH (subdural hematoma) (HCC) -Bilateral subdural hematomas traumatic status post fall at home, was on anticoagulation -INR was reversed in ED, neurosurgery recommended follow-up in 3 weeks and a repeat CT scan will be done -Continue to hold Coumadin for now.  Left proximal humerus fracture -Orthopedics was consulted, patient underwent reverse shoulder arthroplasty left on 12/31 -Postop day #7, continue pain control, keep fine balance between pain control and sedation.   Acute delirium with sundowning, waxing and waning -Waxing and waning mental status, today confused, yesterday was alert and oriented -Avoid sedatives, will DC Robaxin.  Had received Percocet this morning prior to  encounter   Acute on chronic diastolic CHF with volume overload, acute hypoxic respiratory failure -Negative balance of 4 L, creatinine trended up to 1.6, still has bibasilar crackles, sats 94% on 3 L -Transition to oral Lasix, check weight -2D echo showed EF of 65 to 70% with grade 1 diastolic dysfunction, no regional wall motion abnormalities  Essential hypertension BP better controlled, continue Imdur, hydralazine, metoprolol, Lasix BP slightly on the elevated side, increase Imdur to 60 mg daily, continue hydralazine, metoprolol   Acute kidney injury superimposed on CKD stage III Hold IV Lasix, creatinine trended up to 1.6, hold lisinopril  Facial laceration Repaired in ED  Paroxysmal atrial fibrillation -Remained stable, continue metoprolol, no warfarin until cleared by neurosurgery  Insulin-dependent diabetes mellitus CBG Stable, continue SSI  Paroxysmal atrial fibrillation on warfarin as outpatient Continue metoprolol, no warfarin until cleared by neurosurgery outpatient  Interstitial lung disease Currently stable   Code Status: Full CODE STATUS DVT Prophylaxis:  SCD's Family Communication: Discussed in detail with the patient, all imaging results, lab results explained to the patient.    Disposition Plan: SNF when bed available  Time Spent in minutes  25 minutes  Procedures:  05/19/2018 1. REVERSE SHOULDER ARTHROPLASTY  Consultants:   Neurosurgery Cardiology Neurology  Antimicrobials:  Medications  Scheduled Meds: . atorvastatin  40 mg Oral QPM  . cholecalciferol  1,000 Units Oral Daily  . docusate sodium  100 mg Oral BID  . furosemide  40 mg Oral Daily  . hydrALAZINE  25 mg Oral Q8H  . hydrocortisone cream   Topical TID  . insulin aspart  0-9 Units Subcutaneous TID WC  . insulin detemir  26 Units Subcutaneous Daily  . isosorbide mononitrate  60 mg Oral Daily  . metoprolol tartrate  50 mg Oral BID  . pantoprazole  40 mg Oral Daily  .  vitamin B-12  1,000 mcg Oral Daily   Continuous Infusions: . lactated ringers Stopped (05/23/18 1300)   PRN Meds:.acetaminophen, albuterol, bisacodyl, hydrALAZINE, LORazepam, menthol-cetylpyridinium **OR** phenol, methocarbamol, ondansetron **OR** ondansetron (ZOFRAN) IV, oxyCODONE-acetaminophen, polyethylene glycol, zolpidem   Antibiotics   Anti-infectives (From admission, onward)   Start     Dose/Rate Route Frequency Ordered Stop   05/19/18 1200  ceFAZolin (ANCEF) IVPB 2g/100 mL premix     2 g 200 mL/hr over 30 Minutes Intravenous On call to O.R. 05/19/18 1146 05/19/18 1325   05/19/18 1155  ceFAZolin (ANCEF) 2-4 GM/100ML-% IVPB    Note to Pharmacy:  Grace Blight   : cabinet override      05/19/18 1155 05/19/18 2359        Subjective:   Justin Leach was seen and examined today.  Confused, paranoid.  No acute complaints.  No nausea vomiting diarrhea, abdominal pain or fevers.    Objective:   Vitals:   05/26/18 0800 05/26/18 1128 05/26/18 1221 05/26/18 1225  BP: (!) 134/111  (!) 119/42   Pulse: 79  64   Resp: 19  19   Temp:    97.6 F (36.4 C)  TempSrc:      SpO2: 96% 94% (!) 89%   Weight:      Height:        Intake/Output Summary (Last 24 hours) at 05/26/2018 1244 Last data filed at 05/26/2018 1027 Gross per 24 hour  Intake 480 ml  Output 900 ml  Net -420 ml     Wt Readings from Last 3 Encounters:  05/19/18 75.6 kg  04/16/16 91.4 kg  02/01/16 89.8 kg    Physical Exam  General: Confused, oriented to self  Eyes:   HEENT:  Cardiovascular: S1 S2 clear, no murmurs, RRR.  1+ pedal edema b/l  Respiratory: Bibasilar crackles  Gastrointestinal: Soft, nontender, nondistended, NBS  Ext: 1+ pitting edema  Neuro: no new deficits  Musculoskeletal: No cyanosis, clubbing  Skin: No rashes  Psych: Confused      Data Reviewed:  I have personally reviewed following labs and imaging studies  Micro Results No results found for this or any previous  visit (from the past 240 hour(s)).  Radiology Reports Dg Chest 1 View  Result Date: 05/13/2018 CLINICAL DATA:  Recent fall with left shoulder fracture, initial encounter EXAM: CHEST  1 VIEW COMPARISON:  10/30/2015 FINDINGS: Cardiac shadow is enlarged but stable. Aortic calcifications are again seen. Lungs are well aerated bilaterally with minimal left basilar scarring. No focal infiltrate or effusion is seen. No acute bony abnormality is noted aside from the known left humeral fracture. IMPRESSION: Left humeral fracture. Left basilar scarring. Electronically Signed   By: Inez Catalina M.D.   On: 05/13/2018 20:45   Ct Head Wo Contrast  Result Date: 05/14/2018 CLINICAL DATA:  Follow up subdural hematomas. EXAM: CT HEAD WITHOUT CONTRAST TECHNIQUE: Contiguous axial images were obtained from  the base of the skull through the vertex without intravenous contrast. COMPARISON:  CT HEAD May 13, 2018 FINDINGS: BRAIN: No intraparenchymal hemorrhage, mass effect nor midline shift. Moderate to severe parenchymal brain volume loss, mild sulcal effacement at the convexities and narrowed callosum angle. Faint supratentorial white matter hypodensities less than expected for patient's age, though non-specific are most compatible with chronic small vessel ischemic disease. No acute large vascular territory infarcts. Decreased 6 mm LEFT holo hemispheric and 2 mm RIGHT fronto tentorial dense subdural hematomas. Basal cisterns are patent. VASCULAR: Moderate calcific atherosclerosis of the carotid siphons. SKULL: No skull fracture. Focal LEFT frontal scalp soft tissue swelling, possible contusion. Multifocal scalp scarring. SINUSES/ORBITS: Trace paranasal sinus mucosal thickening. Mastoid air cells are well aerated.The included ocular globes and orbital contents are non-suspicious. Status post bilateral ocular lens implants. OTHER: None. IMPRESSION: 1. Decreased size of bilateral acute subdural hematomas measuring to 6 mm on  the LEFT. No midline shift. 2. Image findings of chronic communicating hydrocephalus. Electronically Signed   By: Elon Alas M.D.   On: 05/14/2018 05:29   Ct Head Wo Contrast  Result Date: 05/13/2018 CLINICAL DATA:  Patient fell at home hitting head on kitchen table. EXAM: CT HEAD WITHOUT CONTRAST TECHNIQUE: Contiguous axial images were obtained from the base of the skull through the vertex without intravenous contrast. COMPARISON:  03/23/2006 FINDINGS: Brain: Acute left subdural hematoma measuring up to 10 mm in thickness without subfalcine shift is identified overlying the convexity of the left frontal, temporal and parietal lobes. Additional smaller right-sided subdural hematoma is identified overlying the right temporal lobe measuring up to 3 mm in thickness. No acute intraparenchymal hemorrhage. Chronic microvascular ischemia with atrophy is noted. Vascular: No hyperdense vessel sign. No acute Skull: No acute skull fracture. No suspicious osseous lesions. Sinuses/Orbits: Intact Other: Small 7 mm scalp nodule overlying the right parietal skull minimally larger than prior. IMPRESSION: 1. Acute, left greater than right subdural hematomas measuring up to 10 mm in thickness on the left overlying the left frontal, temporal and parietal lobes and up to 3 mm overlying the right temporal lobe. No midline shift. These results were called by telephone at the time of interpretation on 05/13/2018 at 8:30 pm to PA [facility] , who verbally acknowledged these results. 2. Chronic stable atrophy and microvascular ischemia. 3. No acute osseous abnormality of the bony calvarium. Electronically Signed   By: Ashley Royalty M.D.   On: 05/13/2018 20:31   Ct Shoulder Left Wo Contrast  Result Date: 05/13/2018 CLINICAL DATA:  Encounter for left shoulder fracture after fall today. EXAM: CT OF THE UPPER LEFT EXTREMITY WITHOUT CONTRAST TECHNIQUE: Multidetector CT imaging of the upper left extremity was performed according to  the standard protocol. COMPARISON:  Same day radiographs of the left shoulder. FINDINGS: Bones/Joint/Cartilage There is an acute comminuted proximal left humerus fracture involving the greater tuberosity and surgical neck. One shaft with anterior displacement of the humeral shaft relative to the humeral head is identified. The humeral head maintains its articulation the glenoid. The [facility] joint is likewise maintained. The left scapula and included left ribs appear intact. Ligaments Suboptimally assessed by CT. Muscles and Tendons Intramuscular hemorrhage or atrophy. Mild posttraumatic soft tissue induration left axilla. No significant joint effusion. Soft tissues Mild soft tissue contusion overlying the deltoid. IMPRESSION: Neer category 2 part fracture of the proximal left humerus with one shaft width anterior displacement of the humeral shaft relative to the humeral head. Nondisplaced fracture undermining the greater tuberosity is  identified. No joint dislocation is seen. Electronically Signed   By: Ashley Royalty M.D.   On: 05/13/2018 22:56   Dg Chest Port 1 View  Result Date: 05/23/2018 CLINICAL DATA:  Hypoxia. EXAM: PORTABLE CHEST 1 VIEW COMPARISON:  05/13/2018 FINDINGS: Cardiac enlargement with low lung volumes. Aortic atherosclerosis. Worsening aeration to the left lung base may be due to atelectasis and/or airspace disease. Small right pleural effusion is suspected. IMPRESSION: 1. Diminished aeration to left base compared with prior exam. 2. Suspect small right pleural effusion. Electronically Signed   By: Kerby Moors M.D.   On: 05/23/2018 10:48   Dg Shoulder Left  Result Date: 05/13/2018 CLINICAL DATA:  Recent fall with shoulder pain, initial encounter EXAM: LEFT SHOULDER - 2+ VIEW COMPARISON:  None. FINDINGS: Transverse fracture is noted through the surgical neck of the humerus with displacement of the humerus somewhat medially with respect to the humeral head. Fracture lines extend into the humeral  head with mild displacement of the greater tuberosity. No other focal abnormality is seen. IMPRESSION: Comminuted fracture of the proximal left humerus. The fracture involves primarily the surgical neck. Electronically Signed   By: Inez Catalina M.D.   On: 05/13/2018 20:44   Dg Shoulder Left Port  Result Date: 05/19/2018 CLINICAL DATA:  Shoulder arthroplasty EXAM: LEFT SHOULDER - 1 VIEW COMPARISON:  05/13/2018 FINDINGS: Interval left shoulder replacement with intact hardware and normal alignment. Fracture lucency within the proximal shaft of the humerus. Gas in the soft tissues consistent with recent surgery. IMPRESSION: Interval left shoulder replacement for proximal humerus fracture with expected postsurgical changes Electronically Signed   By: Donavan Foil M.D.   On: 05/19/2018 18:16    Lab Data:  CBC: Recent Labs  Lab 05/20/18 0752 05/24/18 0339  WBC 14.1* 12.9*  HGB 11.0* 10.6*  HCT 34.6* 32.3*  MCV 98.9 95.0  PLT 240 440   Basic Metabolic Panel: Recent Labs  Lab 05/22/18 0351 05/23/18 1035 05/24/18 0339 05/25/18 0608 05/26/18 0329  NA 139 137 136 133* 136  K 4.5 4.7 4.2 3.8 3.9  CL 103 100 96* 90* 93*  CO2 29 28 30  34* 36*  GLUCOSE 84 144* 104* 132* 95  BUN 46* 44* 38* 33* 37*  CREATININE 1.67* 1.47* 1.32* 1.44* 1.65*  CALCIUM 8.8* 8.9 9.0 8.8* 8.7*   GFR: Estimated Creatinine Clearance: 32.2 mL/min (A) (by C-G formula based on SCr of 1.65 mg/dL (H)). Liver Function Tests: No results for input(s): AST, ALT, ALKPHOS, BILITOT, PROT, ALBUMIN in the last 168 hours. No results for input(s): LIPASE, AMYLASE in the last 168 hours. No results for input(s): AMMONIA in the last 168 hours. Coagulation Profile: No results for input(s): INR, PROTIME in the last 168 hours. Cardiac Enzymes: No results for input(s): CKTOTAL, CKMB, CKMBINDEX, TROPONINI in the last 168 hours. BNP (last 3 results) No results for input(s): PROBNP in the last 8760 hours. HbA1C: No results for  input(s): HGBA1C in the last 72 hours. CBG: Recent Labs  Lab 05/25/18 1212 05/25/18 1622 05/25/18 2132 05/26/18 0813 05/26/18 1222  GLUCAP 164* 155* 117* 110* 175*   Lipid Profile: No results for input(s): CHOL, HDL, LDLCALC, TRIG, CHOLHDL, LDLDIRECT in the last 72 hours. Thyroid Function Tests: No results for input(s): TSH, T4TOTAL, FREET4, T3FREE, THYROIDAB in the last 72 hours. Anemia Panel: No results for input(s): VITAMINB12, FOLATE, FERRITIN, TIBC, IRON, RETICCTPCT in the last 72 hours. Urine analysis:    Component Value Date/Time   COLORURINE YELLOW 11/09/2016 1724  APPEARANCEUR CLEAR 11/09/2016 1724   LABSPEC 1.018 11/09/2016 1724   PHURINE 5.0 11/09/2016 1724   GLUCOSEU NEGATIVE 11/09/2016 1724   HGBUR NEGATIVE 11/09/2016 1724   BILIRUBINUR NEGATIVE 11/09/2016 1724   KETONESUR NEGATIVE 11/09/2016 1724   PROTEINUR 30 (A) 11/09/2016 1724   UROBILINOGEN 0.2 05/11/2013 1632   NITRITE NEGATIVE 11/09/2016 1724   LEUKOCYTESUR NEGATIVE 11/09/2016 1724     Mikeria Valin M.D. [facility] 05/26/2018, 12:44 PM  Pager: [phone] Between 7am to 7pm - call Pager - [phone]  After 7pm go to [email or web address] - password [email or web address]  Call night coverage person covering after 7pm

## 2018-05-27 DIAGNOSIS — Z96612 Presence of left artificial shoulder joint: Secondary | ICD-10-CM | POA: Diagnosis not present

## 2018-05-27 DIAGNOSIS — M109 Gout, unspecified: Secondary | ICD-10-CM | POA: Diagnosis not present

## 2018-05-27 DIAGNOSIS — I130 Hypertensive heart and chronic kidney disease with heart failure and stage 1 through stage 4 chronic kidney disease, or unspecified chronic kidney disease: Secondary | ICD-10-CM | POA: Diagnosis not present

## 2018-05-27 DIAGNOSIS — Y92230 Patient room in hospital as the place of occurrence of the external cause: Secondary | ICD-10-CM | POA: Diagnosis not present

## 2018-05-27 DIAGNOSIS — R531 Weakness: Secondary | ICD-10-CM | POA: Diagnosis not present

## 2018-05-27 DIAGNOSIS — E785 Hyperlipidemia, unspecified: Secondary | ICD-10-CM | POA: Diagnosis not present

## 2018-05-27 DIAGNOSIS — S065X9D Traumatic subdural hemorrhage with loss of consciousness of unspecified duration, subsequent encounter: Secondary | ICD-10-CM | POA: Diagnosis not present

## 2018-05-27 DIAGNOSIS — R41841 Cognitive communication deficit: Secondary | ICD-10-CM | POA: Diagnosis not present

## 2018-05-27 DIAGNOSIS — M6281 Muscle weakness (generalized): Secondary | ICD-10-CM | POA: Diagnosis not present

## 2018-05-27 DIAGNOSIS — N183 Chronic kidney disease, stage 3 (moderate): Secondary | ICD-10-CM | POA: Diagnosis not present

## 2018-05-27 DIAGNOSIS — I10 Essential (primary) hypertension: Secondary | ICD-10-CM | POA: Diagnosis not present

## 2018-05-27 DIAGNOSIS — E1169 Type 2 diabetes mellitus with other specified complication: Secondary | ICD-10-CM | POA: Diagnosis not present

## 2018-05-27 DIAGNOSIS — M5090 Cervical disc disorder, unspecified, unspecified cervical region: Secondary | ICD-10-CM | POA: Diagnosis not present

## 2018-05-27 DIAGNOSIS — R279 Unspecified lack of coordination: Secondary | ICD-10-CM | POA: Diagnosis not present

## 2018-05-27 DIAGNOSIS — Y92000 Kitchen of unspecified non-institutional (private) residence as  the place of occurrence of the external cause: Secondary | ICD-10-CM | POA: Diagnosis not present

## 2018-05-27 DIAGNOSIS — Z7401 Bed confinement status: Secondary | ICD-10-CM | POA: Diagnosis not present

## 2018-05-27 DIAGNOSIS — Z9181 History of falling: Secondary | ICD-10-CM | POA: Diagnosis not present

## 2018-05-27 DIAGNOSIS — J849 Interstitial pulmonary disease, unspecified: Secondary | ICD-10-CM | POA: Diagnosis not present

## 2018-05-27 DIAGNOSIS — M79622 Pain in left upper arm: Secondary | ICD-10-CM | POA: Diagnosis not present

## 2018-05-27 DIAGNOSIS — M10371 Gout due to renal impairment, right ankle and foot: Secondary | ICD-10-CM | POA: Diagnosis not present

## 2018-05-27 DIAGNOSIS — Z23 Encounter for immunization: Secondary | ICD-10-CM | POA: Diagnosis present

## 2018-05-27 DIAGNOSIS — S42352A Displaced comminuted fracture of shaft of humerus, left arm, initial encounter for closed fracture: Secondary | ICD-10-CM | POA: Diagnosis not present

## 2018-05-27 DIAGNOSIS — M2550 Pain in unspecified joint: Secondary | ICD-10-CM | POA: Diagnosis not present

## 2018-05-27 DIAGNOSIS — K219 Gastro-esophageal reflux disease without esophagitis: Secondary | ICD-10-CM | POA: Diagnosis not present

## 2018-05-27 DIAGNOSIS — R404 Transient alteration of awareness: Secondary | ICD-10-CM | POA: Diagnosis not present

## 2018-05-27 DIAGNOSIS — W01190A Fall on same level from slipping, tripping and stumbling with subsequent striking against furniture, initial encounter: Secondary | ICD-10-CM | POA: Diagnosis not present

## 2018-05-27 DIAGNOSIS — S42352S Displaced comminuted fracture of shaft of humerus, left arm, sequela: Secondary | ICD-10-CM | POA: Diagnosis not present

## 2018-05-27 DIAGNOSIS — I5032 Chronic diastolic (congestive) heart failure: Secondary | ICD-10-CM | POA: Diagnosis not present

## 2018-05-27 DIAGNOSIS — S065X0D Traumatic subdural hemorrhage without loss of consciousness, subsequent encounter: Secondary | ICD-10-CM | POA: Diagnosis not present

## 2018-05-27 DIAGNOSIS — E1122 Type 2 diabetes mellitus with diabetic chronic kidney disease: Secondary | ICD-10-CM | POA: Diagnosis not present

## 2018-05-27 DIAGNOSIS — W19XXXA Unspecified fall, initial encounter: Secondary | ICD-10-CM | POA: Diagnosis not present

## 2018-05-27 DIAGNOSIS — I2510 Atherosclerotic heart disease of native coronary artery without angina pectoris: Secondary | ICD-10-CM | POA: Diagnosis not present

## 2018-05-27 DIAGNOSIS — S065X9A Traumatic subdural hemorrhage with loss of consciousness of unspecified duration, initial encounter: Secondary | ICD-10-CM | POA: Diagnosis not present

## 2018-05-27 DIAGNOSIS — I480 Paroxysmal atrial fibrillation: Secondary | ICD-10-CM | POA: Diagnosis not present

## 2018-05-27 DIAGNOSIS — R52 Pain, unspecified: Secondary | ICD-10-CM | POA: Diagnosis not present

## 2018-05-27 LAB — CBC
HCT: 33.3 % — ABNORMAL LOW (ref 39.0–52.0)
Hemoglobin: 10.5 g/dL — ABNORMAL LOW (ref 13.0–17.0)
MCH: 29.6 pg (ref 26.0–34.0)
MCHC: 31.5 g/dL (ref 30.0–36.0)
MCV: 93.8 fL (ref 80.0–100.0)
Platelets: 327 10*3/uL (ref 150–400)
RBC: 3.55 MIL/uL — ABNORMAL LOW (ref 4.22–5.81)
RDW: 14.6 % (ref 11.5–15.5)
WBC: 13.1 10*3/uL — ABNORMAL HIGH (ref 4.0–10.5)
nRBC: 0 % (ref 0.0–0.2)

## 2018-05-27 LAB — BASIC METABOLIC PANEL
Anion gap: 8 (ref 5–15)
BUN: 39 mg/dL — ABNORMAL HIGH (ref 8–23)
CHLORIDE: 92 mmol/L — AB (ref 98–111)
CO2: 36 mmol/L — ABNORMAL HIGH (ref 22–32)
Calcium: 8.8 mg/dL — ABNORMAL LOW (ref 8.9–10.3)
Creatinine, Ser: 1.93 mg/dL — ABNORMAL HIGH (ref 0.61–1.24)
GFR calc Af Amer: 36 mL/min — ABNORMAL LOW (ref >60)
GFR calc non Af Amer: 31 mL/min — ABNORMAL LOW (ref >60)
Glucose, Bld: 86 mg/dL (ref 70–99)
POTASSIUM: 3.8 mmol/L (ref 3.5–5.1)
Sodium: 136 mmol/L (ref 135–145)

## 2018-05-27 LAB — GLUCOSE, CAPILLARY
Glucose-Capillary: 110 mg/dL — ABNORMAL HIGH (ref 70–99)
Glucose-Capillary: 168 mg/dL — ABNORMAL HIGH (ref 70–99)

## 2018-05-27 LAB — BRAIN NATRIURETIC PEPTIDE: B Natriuretic Peptide: 105.3 pg/mL — ABNORMAL HIGH (ref 0.0–100.0)

## 2018-05-27 MED ORDER — VITAMIN D-3 25 MCG (1000 UT) PO CAPS: 1.0000 | ORAL_CAPSULE | Freq: Every day | ORAL | Status: DC

## 2018-05-27 MED ORDER — DOCUSATE SODIUM 100 MG PO CAPS: 100.0000 mg | ORAL_CAPSULE | Freq: Two times a day (BID) | ORAL | 0 refills | Status: DC

## 2018-05-27 MED ORDER — HYDROCORTISONE 1 % EX CREA: TOPICAL_CREAM | Freq: Two times a day (BID) | CUTANEOUS | 0 refills | Status: DC

## 2018-05-27 MED ORDER — HYDRALAZINE HCL 25 MG PO TABS: 25.0000 mg | ORAL_TABLET | Freq: Three times a day (TID) | ORAL | Status: DC

## 2018-05-27 MED ORDER — INSULIN DETEMIR 100 UNIT/ML ~~LOC~~ SOLN: 26.0000 [IU] | Freq: Every day | SUBCUTANEOUS | 11 refills | Status: DC

## 2018-05-27 MED ORDER — LORAZEPAM 0.5 MG PO TABS: 0.5000 mg | ORAL_TABLET | Freq: Three times a day (TID) | ORAL | 0 refills | Status: DC | PRN

## 2018-05-27 MED ORDER — OXYCODONE-ACETAMINOPHEN 5-325 MG PO TABS: 1.0000 | ORAL_TABLET | Freq: Four times a day (QID) | ORAL | 0 refills | Status: DC | PRN

## 2018-05-27 MED ORDER — SALINE SPRAY 0.65 % NA SOLN: 1.0000 | NASAL | 0 refills | Status: DC | PRN

## 2018-05-27 MED ORDER — POLYETHYLENE GLYCOL 3350 17 G PO PACK: 17.0000 g | PACK | Freq: Every day | ORAL | 0 refills | Status: AC | PRN

## 2018-05-27 MED ORDER — INSULIN ASPART 100 UNIT/ML ~~LOC~~ SOLN: 0.0000 [IU] | Freq: Three times a day (TID) | SUBCUTANEOUS | 11 refills | Status: DC

## 2018-05-27 MED ORDER — ISOSORBIDE MONONITRATE ER 60 MG PO TB24: 60.0000 mg | ORAL_TABLET | Freq: Every day | ORAL | Status: DC

## 2018-05-27 MED ORDER — VITAMIN B-12 1000 MCG PO TABS: 1000.0000 ug | ORAL_TABLET | Freq: Every day | ORAL | Status: DC

## 2018-05-27 MED ORDER — BISACODYL 10 MG RE SUPP: 10.0000 mg | Freq: Every day | RECTAL | 0 refills | Status: AC | PRN

## 2018-05-27 MED ORDER — FUROSEMIDE 40 MG PO TABS: 40.0000 mg | ORAL_TABLET | Freq: Every day | ORAL | Status: DC

## 2018-05-27 MED ORDER — ALBUTEROL SULFATE (2.5 MG/3ML) 0.083% IN NEBU: 2.5000 mg | INHALATION_SOLUTION | RESPIRATORY_TRACT | 12 refills | Status: DC | PRN

## 2018-05-27 NOTE — Discharge Summary (Signed)
Physician Discharge Summary   Patient ID: Justin Leach MRN: [id number] DOB/AGE: 26-Jul-1931 85 y.o.  Admit date: 05/13/2018 Discharge date: 05/27/2018  Primary Care Physician:  Lavone Orn, MD   Recommendations for Outpatient Follow-up:  1. Follow up with PCP in 1-2 weeks 2. Please obtain BMP in one week  3. Coumadin on hold until cleared by neurosurgery 4. Avoid sedatives, good sleep hygiene 5. Fall precautions  Home Health: Patient being discharged to skilled nursing facility Equipment/Devices:   Discharge Condition: stable  CODE STATUS: FULL  Diet recommendation: Carb modified diet   Discharge Diagnoses:    Principal discharge diagnosis . SDH (subdural hematoma) (HCC)  Secondary diagnoses . Fall . Facial laceration . Closed comminuted left humeral fracture . Acute on chronic diastolic CHF (congestive heart failure) ([place]) . Acute on CKD (chronic kidney disease), stage III ([place]) . Coronary artery calcification seen on CT scan . Essential hypertension . GERD (gastroesophageal reflux disease) . Hyperlipidemia . PAF (paroxysmal atrial fibrillation) ([place]) . Type II diabetes mellitus with renal manifestations ([place]) . Interstitial lung disease    Consults:  Neurosurgery Cardiology Neurology    Allergies:   Allergies  Allergen Reactions  . 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 Patient subsequently taken off amiodarone.  . Nifedipine Other (See Comments)    Reaction: made gums swell up. Pt states that he had to have gum surgery after taking.      DISCHARGE MEDICATIONS: Allergies as of 05/27/2018      Reactions   Amiodarone Other (See Comments)   MD noted 10/31/15: Chest imaging in January showed new findings concerning for amiodarone toxicity versus NSIP. Patient subsequently taken off amiodarone.   Nifedipine Other (See Comments)   Reaction: made  gums swell up. Pt states that he had to have gum surgery after taking.       Medication List    STOP taking these medications   lisinopril 10 MG tablet Commonly known as:  PRINIVIL,ZESTRIL   warfarin 2 MG tablet Commonly known as:  COUMADIN     TAKE these medications   albuterol (2.5 MG/3ML) 0.083% nebulizer solution Commonly known as:  PROVENTIL Take 3 mLs (2.5 mg total) by nebulization every 4 (four) hours as needed for wheezing or shortness of breath.   atorvastatin 80 MG tablet Commonly known as:  LIPITOR Take 40 mg by mouth every evening.   bisacodyl 10 MG suppository Commonly known as:  DULCOLAX Place 1 suppository (10 mg total) rectally daily as needed for moderate constipation.   docusate sodium 100 MG capsule Commonly known as:  COLACE Take 1 capsule (100 mg total) by mouth 2 (two) times daily.   furosemide 40 MG tablet Commonly known as:  LASIX Take 1 tablet (40 mg total) by mouth daily. Start taking on:  May 28, 2018   hydrALAZINE 25 MG tablet Commonly known as:  APRESOLINE Take 1 tablet (25 mg total) by mouth every 8 (eight) hours.   hydrocortisone cream 1 % Apply topically 2 (two) times daily. Apply to itchy areas on the left arm and chest   insulin aspart 100 UNIT/ML injection Commonly known as:  novoLOG Inject 0-9 Units into the skin 3 (three) times daily with meals. Sliding scale CBG 70 - 120: 0 units CBG 121 - 150: 1 unit,  CBG 151 - 200: 2 units,  CBG 201 - 250: 3 units,  CBG 251 - 300: 5  units,  CBG 301 - 350: 7 units,  CBG 351 - 400: 9 units   CBG > 400: 9 units and notify your MD What changed:    how much to take  when to take this  additional instructions   insulin detemir 100 UNIT/ML injection Commonly known as:  LEVEMIR Inject 0.26 mLs (26 Units total) into the skin at bedtime. What changed:    how much to take  additional instructions   isosorbide mononitrate 60 MG 24 hr tablet Commonly known as:  IMDUR Take 1 tablet (60 mg  total) by mouth daily.   LORazepam 0.5 MG tablet Commonly known as:  ATIVAN Take 1 tablet (0.5 mg total) by mouth every 8 (eight) hours as needed for anxiety.   metoprolol tartrate 50 MG tablet Commonly known as:  LOPRESSOR Take 50 mg by mouth 2 (two) times daily.   omeprazole 20 MG capsule Commonly known as:  PRILOSEC Take 20 mg by mouth daily.   oxyCODONE-acetaminophen 5-325 MG tablet Commonly known as:  PERCOCET/ROXICET Take 1 tablet by mouth every 6 (six) hours as needed for moderate pain or severe pain.   polyethylene glycol packet Commonly known as:  MIRALAX / GLYCOLAX Take 17 g by mouth daily as needed for mild constipation.   sodium chloride 0.65 % Soln nasal spray Commonly known as:  OCEAN Place 1 spray into both nostrils as needed for congestion.   vitamin B-12 1000 MCG tablet Commonly known as:  CYANOCOBALAMIN Take 1 tablet (1,000 mcg total) by mouth daily.   Vitamin D-3 25 MCG (1000 UT) Caps Take 1 capsule (1,000 Units total) by mouth daily.        Brief H and P: For complete details please refer to admission H and P, but in brief Justin M Ballance85 year old man PMH atrial fibrillation on warfarin, presented after a fall at home home, resulting in close head injury, left shoulder pain and facial laceration. Found to have bilateral traumatic subdural hematoma, reversed in the emergency department; left humeral surgical neck fracture. Seen by neurosurgery and orthopedics. Because of scheduling issues surgery was delayed for 5 days.Postoperatively the patient has had delirium but otherwise has done well. He was found to have massive volume overload and placed on aggressive IV diuresis.  Hospital Course:   SDH (subdural hematoma) ([place]) -Patient presented with fall at home resulting in closed head injury. -CT head on admission 12/25 showed acute left greater than right subdural hematomas measuring up to 10 mm in thickness on the left overlying the left  frontal, temporal, parietal lobes and up to 3 mm overlying the right temporal lobe.  No midline shift. -Patient was on Coumadin prior to admission, which was reversed.   -Neurosurgery was consulted, seen by Dr. Ellene Route, recommended follow-up in 3 weeks and repeat CT scan will be done. Repeat CT scan on 12/26 showed decreased size of bilateral acute subdural hematomas measuring up to 6 mm on the left, no midline shift. -Continue to hold Coumadin for now.  Left proximal humerus fracture -Orthopedics was consulted, patient underwent reverse shoulder arthroplasty left on 12/31 -Postop day 8, continue pain control, keep fine balance between pain control and sedation. -Outpatient follow-up with Dr. Stann Mainland in 2 weeks  Acute delirium with sundowning, waxing and waning -Waxing and waning mental status, today alert and awake, conversing. -Robaxin was discontinued, avoid sedatives, recommended Tylenol for mild pain and Percocet for moderate/severe pain   Acute on chronic diastolic CHF with volume overload, acute hypoxic respiratory failure -After  surgeries, patient was found to have massive volume overload, was placed on aggressive IV diuresis with Lasix -2D echo showed EF of 65 to 70% with grade 1 diastolic dysfunction, no regional wall motion abnormalities -Now close to euvolemic, negative balance of 4.1 L, continue oral Lasix 40 mg daily from 1/9.  Lasix placed on hold today due to creatinine trended up to 1.9 with IV Lasix.  BNP 105 at the time of discharge.  Essential hypertension BP better controlled, continue Imdur, hydralazine, metoprolol, Lasix   Acute kidney injury superimposed on CKD stage III Baseline creatinine around 1.6, creatinine trended up to 2.9 Currently 1.93, patient had been on IV Lasix, now transition to oral Lasix, follow bmet in 1 week.  Facial laceration Repaired in ED  Paroxysmal atrial fibrillation -Remained stable, continue metoprolol, no warfarin until  cleared by neurosurgery  Insulin-dependent diabetes mellitus CBG Stable, continue Levemir and sliding scale insulin  Interstitial lung disease Currently stable    Day of Discharge S: Wants to get out of the hospital, eating breakfast.  No acute issues overnight, confusion improving  BP (!) 125/110   Pulse 74   Temp 98.2 F (36.8 C) (Oral)   Resp (!) 30   Ht 5' 7.99" (1.727 m)   Wt 93.7 kg   SpO2 (!) 88%   BMI 31.42 kg/m   Physical Exam: General: Alert and awake oriented x2 not in any acute distress. HEENT: anicteric sclera, pupils reactive to light and accommodation CVS: S1-S2 clear no murmur rubs or gallops Chest: clear to auscultation bilaterally, no wheezing rales or rhonchi Abdomen: soft nontender, nondistended, normal bowel sounds Extremities: no cyanosis, clubbing or edema noted bilaterally, left arm in sling Neuro: No new deficits   The results of significant diagnostics from this hospitalization (including imaging, microbiology, ancillary and laboratory) are listed below for reference.      Procedures/Studies:  Dg Chest 1 View  Result Date: 05/13/2018 CLINICAL DATA:  Recent fall with left shoulder fracture, initial encounter EXAM: CHEST  1 VIEW COMPARISON:  10/30/2015 FINDINGS: Cardiac shadow is enlarged but stable. Aortic calcifications are again seen. Lungs are well aerated bilaterally with minimal left basilar scarring. No focal infiltrate or effusion is seen. No acute bony abnormality is noted aside from the known left humeral fracture. IMPRESSION: Left humeral fracture. Left basilar scarring. Electronically Signed   By: Inez Catalina M.D.   On: 05/13/2018 20:45   Ct Head Wo Contrast  Result Date: 05/14/2018 CLINICAL DATA:  Follow up subdural hematomas. EXAM: CT HEAD WITHOUT CONTRAST TECHNIQUE: Contiguous axial images were obtained from the base of the skull through the vertex without intravenous contrast. COMPARISON:  CT HEAD May 13, 2018 FINDINGS:  BRAIN: No intraparenchymal hemorrhage, mass effect nor midline shift. Moderate to severe parenchymal brain volume loss, mild sulcal effacement at the convexities and narrowed callosum angle. Faint supratentorial white matter hypodensities less than expected for patient's age, though non-specific are most compatible with chronic small vessel ischemic disease. No acute large vascular territory infarcts. Decreased 6 mm LEFT holo hemispheric and 2 mm RIGHT fronto tentorial dense subdural hematomas. Basal cisterns are patent. VASCULAR: Moderate calcific atherosclerosis of the carotid siphons. SKULL: No skull fracture. Focal LEFT frontal scalp soft tissue swelling, possible contusion. Multifocal scalp scarring. SINUSES/ORBITS: Trace paranasal sinus mucosal thickening. Mastoid air cells are well aerated.The included ocular globes and orbital contents are non-suspicious. Status post bilateral ocular lens implants. OTHER: None. IMPRESSION: 1. Decreased size of bilateral acute subdural hematomas measuring to 6 mm on  the LEFT. No midline shift. 2. Image findings of chronic communicating hydrocephalus. Electronically Signed   By: Elon Alas M.D.   On: 05/14/2018 05:29   Ct Head Wo Contrast  Result Date: 05/13/2018 CLINICAL DATA:  Patient fell at home hitting head on kitchen table. EXAM: CT HEAD WITHOUT CONTRAST TECHNIQUE: Contiguous axial images were obtained from the base of the skull through the vertex without intravenous contrast. COMPARISON:  03/23/2006 FINDINGS: Brain: Acute left subdural hematoma measuring up to 10 mm in thickness without subfalcine shift is identified overlying the convexity of the left frontal, temporal and parietal lobes. Additional smaller right-sided subdural hematoma is identified overlying the right temporal lobe measuring up to 3 mm in thickness. No acute intraparenchymal hemorrhage. Chronic microvascular ischemia with atrophy is noted. Vascular: No hyperdense vessel sign. No acute  Skull: No acute skull fracture. No suspicious osseous lesions. Sinuses/Orbits: Intact Other: Small 7 mm scalp nodule overlying the right parietal skull minimally larger than prior. IMPRESSION: 1. Acute, left greater than right subdural hematomas measuring up to 10 mm in thickness on the left overlying the left frontal, temporal and parietal lobes and up to 3 mm overlying the right temporal lobe. No midline shift. These results were called by telephone at the time of interpretation on 05/13/2018 at 8:30 pm to [facility] , who verbally acknowledged these results. 2. Chronic stable atrophy and microvascular ischemia. 3. No acute osseous abnormality of the bony calvarium. Electronically Signed   By: Ashley Royalty M.D.   On: 05/13/2018 20:31   Ct Shoulder Left Wo Contrast  Result Date: 05/13/2018 CLINICAL DATA:  Encounter for left shoulder fracture after fall today. EXAM: CT OF THE UPPER LEFT EXTREMITY WITHOUT CONTRAST TECHNIQUE: Multidetector CT imaging of the upper left extremity was performed according to the standard protocol. COMPARISON:  Same day radiographs of the left shoulder. FINDINGS: Bones/Joint/Cartilage There is an acute comminuted proximal left humerus fracture involving the greater tuberosity and surgical neck. One shaft with anterior displacement of the humeral shaft relative to the humeral head is identified. The humeral head maintains its articulation the glenoid. The [facility] joint is likewise maintained. The left scapula and included left ribs appear intact. Ligaments Suboptimally assessed by CT. Muscles and Tendons Intramuscular hemorrhage or atrophy. Mild posttraumatic soft tissue induration left axilla. No significant joint effusion. Soft tissues Mild soft tissue contusion overlying the deltoid. IMPRESSION: Neer category 2 part fracture of the proximal left humerus with one shaft width anterior displacement of the humeral shaft relative to the humeral head. Nondisplaced fracture undermining the  greater tuberosity is identified. No joint dislocation is seen. Electronically Signed   By: Ashley Royalty M.D.   On: 05/13/2018 22:56   Dg Chest Port 1 View  Result Date: 05/23/2018 CLINICAL DATA:  Hypoxia. EXAM: PORTABLE CHEST 1 VIEW COMPARISON:  05/13/2018 FINDINGS: Cardiac enlargement with low lung volumes. Aortic atherosclerosis. Worsening aeration to the left lung base may be due to atelectasis and/or airspace disease. Small right pleural effusion is suspected. IMPRESSION: 1. Diminished aeration to left base compared with prior exam. 2. Suspect small right pleural effusion. Electronically Signed   By: Kerby Moors M.D.   On: 05/23/2018 10:48   Dg Shoulder Left  Result Date: 05/13/2018 CLINICAL DATA:  Recent fall with shoulder pain, initial encounter EXAM: LEFT SHOULDER - 2+ VIEW COMPARISON:  None. FINDINGS: Transverse fracture is noted through the surgical neck of the humerus with displacement of the humerus somewhat medially with respect to the humeral head. Fracture  lines extend into the humeral head with mild displacement of the greater tuberosity. No other focal abnormality is seen. IMPRESSION: Comminuted fracture of the proximal left humerus. The fracture involves primarily the surgical neck. Electronically Signed   By: Inez Catalina M.D.   On: 05/13/2018 20:44   Dg Shoulder Left Port  Result Date: 05/19/2018 CLINICAL DATA:  Shoulder arthroplasty EXAM: LEFT SHOULDER - 1 VIEW COMPARISON:  05/13/2018 FINDINGS: Interval left shoulder replacement with intact hardware and normal alignment. Fracture lucency within the proximal shaft of the humerus. Gas in the soft tissues consistent with recent surgery. IMPRESSION: Interval left shoulder replacement for proximal humerus fracture with expected postsurgical changes Electronically Signed   By: Donavan Foil M.D.   On: 05/19/2018 18:16      LAB RESULTS: Basic Metabolic Panel: Recent Labs  Lab 05/26/18 0329 05/27/18 0332  NA 136 136  K 3.9 3.8   CL 93* 92*  CO2 36* 36*  GLUCOSE 95 86  BUN 37* 39*  CREATININE 1.65* 1.93*  CALCIUM 8.7* 8.8*   Liver Function Tests: No results for input(s): AST, ALT, ALKPHOS, BILITOT, PROT, ALBUMIN in the last 168 hours. No results for input(s): LIPASE, AMYLASE in the last 168 hours. Recent Labs  Lab 05/26/18 1346  AMMONIA 34   CBC: Recent Labs  Lab 05/24/18 0339 05/27/18 0332  WBC 12.9* 13.1*  HGB 10.6* 10.5*  HCT 32.3* 33.3*  MCV 95.0 93.8  PLT 334 327   Cardiac Enzymes: No results for input(s): CKTOTAL, CKMB, CKMBINDEX, TROPONINI in the last 168 hours. BNP: Invalid input(s): POCBNP CBG: Recent Labs  Lab 05/26/18 2111 05/27/18 0756  GLUCAP 116* 110*      Disposition and Follow-up: Discharge Instructions    Diet Carb Modified   Complete by:  As directed    Increase activity slowly   Complete by:  As directed        DISPOSITION: [place], Jason Patrick, MD In 2 weeks.   Specialty:  Orthopedic Surgery Why:  For suture removal, For wound re-check Contact information: [address] [place] [address] [phone]        Lavone Orn, MD. Schedule an appointment as soon as possible for a visit in 2 week(s).   Specialty:  Internal Medicine Contact information: [address], [address] [place] [address] [phone]        Kristeen Miss, MD. Schedule an appointment as soon as possible for a visit in 3 week(s).   Specialty:  Neurosurgery Contact information: [address] [place] [address] [phone]            Time coordinating discharge:  16mins   Signed:   Estill Cotta M.D. [facility] 05/27/2018, 10:40 AM Pager: [phone]

## 2018-05-27 NOTE — Social Work (Signed)
Clinical Social Worker facilitated patient discharge including contacting patient family and facility to confirm patient discharge plans.  Clinical information faxed to facility and family agreeable with plan.  CSW arranged ambulance transport via [facility] to [place] to call [phone]  with report prior to discharge.  Clinical Social Worker will sign off for now as social work intervention is no longer needed. Please consult [place] again if new need arises.  Westley Hummer, MSW, [place] Social Worker [phone]

## 2018-05-27 NOTE — Progress Notes (Signed)
Occupational Therapy Treatment Patient Details Name: Justin Leach MRN: [id number] DOB: Jul 18, 1931 Today's Date: 05/27/2018    History of present illness   Patient is a 85 year old male with PMHx: HTN, HLD, DM, GERD, diastolic CHF, CKD stage III, CAD, PAF, interstitial lung disease who presented with a fall at home.  Lt humeral neck fx s/p reverse total shoulder 12/31.  CT head showed bil SDH   OT comments  Patient awaiting transport to [facility], agreeable to L UE ROM supine level.  Reviewed exercises with patient and family, patient reports completing exercises but unreliable and family reports "attempting" exercises but "not done well".  Patient demonstrates improving AROM at hand, wrist and supination/pronation; PROM at elbow for extension with increased ROM and -10 degrees extension only today.  Will follow, but plans to dc to SNF today.    Follow Up Recommendations  SNF;Supervision/Assistance - 24 hour    Equipment Recommendations  3 in 1 bedside commode;Hospital bed;Wheelchair cushion (measurements OT);Wheelchair (measurements OT);Other (comment)    Recommendations for Other Services      Precautions / Restrictions Precautions Precautions: Fall Precaution Comments: ROM hand wrist elbow L UE, watch oxygen Required Braces or Orthoses: Sling Restrictions Weight Bearing Restrictions: Yes LUE Weight Bearing: Non weight bearing       Mobility Bed Mobility               General bed mobility comments: deferred today   Transfers                 General transfer comment: deferred today, focus on ROM exercises    Balance                                           ADL either performed or assessed with clinical judgement   ADL                                         General ADL Comments: continues to need max to total assist for ADLs, this session focused on ROM exercises to L UE this session      Vision        Perception     Praxis      Cognition Arousal/Alertness: Lethargic Behavior During Therapy: Flat affect Overall Cognitive Status: Impaired/Different from baseline Area of Impairment: Memory;Safety/judgement;Awareness;Problem solving;Following commands;Attention                   Current Attention Level: Focused Memory: Decreased recall of precautions;Decreased short-term memory Following Commands: Follows one step commands inconsistently;Follows one step commands with increased time Safety/Judgement: Decreased awareness of safety;Decreased awareness of deficits Awareness: Intellectual Problem Solving: Slow processing;Difficulty sequencing General Comments: patient with improved alertness today, aware of dc plan to SNF today        Exercises Exercises: Shoulder Shoulder Exercises Elbow Flexion: Left;10 reps;AAROM;Supine Elbow Extension: PROM;Left;10 reps;Supine Wrist Flexion: AROM;Left;10 reps;Supine Wrist Extension: AROM;Left;10 reps;Supine Digit Composite Flexion: AROM;Left;10 reps;Supine Composite Extension: Left;10 reps;Supine;AROM   Shoulder Instructions Shoulder Instructions Donning/doffing sling/immobilizer: Maximal assistance Correct positioning of sling/immobilizer: Maximal assistance;Caregiver independent with task ROM for elbow, wrist and digits of operated UE: Maximal assistance Sling wearing schedule (on at all times/off for ADL's): Maximal assistance     General Comments reviewed exercises with patient and family, family reports "  weve attempted, but haven't done much"     Pertinent Vitals/ Pain       Pain Assessment: Faces Faces Pain Scale: Hurts little more Pain Location: L UE  Pain Descriptors / Indicators: Discomfort;Grimacing;Moaning Pain Intervention(s): Limited activity within patient's tolerance;Monitored during session;Repositioned;Ice applied  Home Living                                          Prior  Functioning/Environment              Frequency  Min 3X/week        Progress Toward Goals  OT Goals(current goals can now be found in the care plan section)  Progress towards OT goals: Progressing toward goals  Acute Rehab OT Goals Patient Stated Goal: to get him better  OT Goal Formulation: With family Time For Goal Achievement: 05/29/18 Potential to Achieve Goals: Good  Plan Discharge plan remains appropriate;Frequency remains appropriate    Co-evaluation                 AM-PAC OT "6 Clicks" Daily Activity     Outcome Measure   Help from another person eating meals?: Total Help from another person taking care of personal grooming?: Total Help from another person toileting, which includes using toliet, bedpan, or urinal?: Total Help from another person bathing (including washing, rinsing, drying)?: Total Help from another person to put on and taking off regular upper body clothing?: Total Help from another person to put on and taking off regular lower body clothing?: Total 6 Click Score: 6    End of Session Equipment Utilized During Treatment: Oxygen;Other (comment)(sling)  OT Visit Diagnosis: Other abnormalities of gait and mobility (R26.89);Pain Pain - Right/Left: Left Pain - part of body: Shoulder   Activity Tolerance Patient limited by lethargy   Patient Left in bed;with call bell/phone within reach;with family/visitor present;with bed alarm set   Nurse Communication Mobility status        Time: 1424-1440 OT Time Calculation (min): 16 min  Charges: OT General Charges $OT Visit: 1 Visit OT Treatments $Therapeutic Exercise: 8-22 mins  Delight Stare, OT [place] Pager [phone] Office [phone]    Delight Stare 05/27/2018, 2:52 PM

## 2018-05-27 NOTE — Progress Notes (Signed)
Report called to [place] at [facility].  IV removed and family updated.  [facility] to arrive at 1:00 to transport patient to facility.

## 2018-05-27 NOTE — Clinical Social Work Placement (Signed)
   CLINICAL SOCIAL WORK PLACEMENT  NOTE [facility]  Date:  05/27/2018  Patient Details  Name: Justin Leach MRN: [id number] Date of Birth: January 09, 1932  Clinical Social Work is seeking post-discharge placement for this patient at the [place] level of care (*CSW will initial, date and re-position this form in  chart as items are completed):  Yes   Patient/family provided with [facility] Work Department's list of facilities offering this level of care within the geographic area requested by the patient (or if unable, by the patient's family).  Yes   Patient/family informed of their freedom to choose among providers that offer the needed level of care, that participate in Medicare, Medicaid or managed care program needed by the patient, have an available bed and are willing to accept the patient.  Yes   Patient/family informed of [facility]'s ownership interest in [facility] and [facility], as well as of the fact that they are under no obligation to receive care at these facilities.  PASRR submitted to EDS on 05/14/18     PASRR number received on 05/14/18     Existing PASRR number confirmed on       FL2 transmitted to all facilities in geographic area requested by pt/family on 05/16/18     FL2 transmitted to all facilities within larger geographic area on       Patient informed that his/her managed care company has contracts with or will negotiate with certain facilities, including the following:        Yes   Patient/family informed of bed offers received.  Patient chooses bed at [facility]     Physician recommends and patient chooses bed at      Patient to be transferred to [facility] on 05/27/18.  Patient to be transferred to facility by [facility]     Patient family notified on 05/27/18 of transfer.  Name of family member notified:  pt wife at bedside     PHYSICIAN       Additional Comment:     _______________________________________________ Alexander Mt, [place] 05/27/2018, 10:56 AM

## 2018-05-28 ENCOUNTER — Encounter: Payer: Self-pay | Admitting: Adult Health

## 2018-05-28 ENCOUNTER — Non-Acute Institutional Stay (SKILLED_NURSING_FACILITY): Payer: Medicare Other | Admitting: Adult Health

## 2018-05-28 DIAGNOSIS — N183 Chronic kidney disease, stage 3 unspecified: Secondary | ICD-10-CM

## 2018-05-28 DIAGNOSIS — I130 Hypertensive heart and chronic kidney disease with heart failure and stage 1 through stage 4 chronic kidney disease, or unspecified chronic kidney disease: Secondary | ICD-10-CM

## 2018-05-28 DIAGNOSIS — E1122 Type 2 diabetes mellitus with diabetic chronic kidney disease: Secondary | ICD-10-CM

## 2018-05-28 DIAGNOSIS — Z794 Long term (current) use of insulin: Secondary | ICD-10-CM

## 2018-05-28 DIAGNOSIS — I2510 Atherosclerotic heart disease of native coronary artery without angina pectoris: Secondary | ICD-10-CM | POA: Diagnosis not present

## 2018-05-28 DIAGNOSIS — S42352S Displaced comminuted fracture of shaft of humerus, left arm, sequela: Secondary | ICD-10-CM

## 2018-05-28 DIAGNOSIS — S065XAA Traumatic subdural hemorrhage with loss of consciousness status unknown, initial encounter: Secondary | ICD-10-CM

## 2018-05-28 DIAGNOSIS — E785 Hyperlipidemia, unspecified: Secondary | ICD-10-CM

## 2018-05-28 DIAGNOSIS — S065X9D Traumatic subdural hemorrhage with loss of consciousness of unspecified duration, subsequent encounter: Secondary | ICD-10-CM | POA: Diagnosis not present

## 2018-05-28 DIAGNOSIS — J8410 Pulmonary fibrosis, unspecified: Secondary | ICD-10-CM

## 2018-05-28 DIAGNOSIS — I480 Paroxysmal atrial fibrillation: Secondary | ICD-10-CM

## 2018-05-28 DIAGNOSIS — E1169 Type 2 diabetes mellitus with other specified complication: Secondary | ICD-10-CM

## 2018-05-28 DIAGNOSIS — K5909 Other constipation: Secondary | ICD-10-CM

## 2018-05-28 DIAGNOSIS — I5032 Chronic diastolic (congestive) heart failure: Secondary | ICD-10-CM

## 2018-05-28 DIAGNOSIS — S065X9A Traumatic subdural hemorrhage with loss of consciousness of unspecified duration, initial encounter: Secondary | ICD-10-CM

## 2018-05-28 DIAGNOSIS — K219 Gastro-esophageal reflux disease without esophagitis: Secondary | ICD-10-CM

## 2018-05-28 DIAGNOSIS — M79622 Pain in left upper arm: Secondary | ICD-10-CM | POA: Diagnosis not present

## 2018-05-28 NOTE — Progress Notes (Signed)
Location:   [facility] Room Number: [place] of Service:  SNF (31)   CODE STATUS: Full Code  Allergies  Allergen Reactions  . 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 Patient subsequently taken off amiodarone.  . Nifedipine Other (See Comments)    Reaction: made gums swell up. Pt states that he had to have gum surgery after taking.     Chief Complaint  Patient presents with  . Hospitalization Follow-up    Hospital Follow up    HPI:  He is a 85 year old man who has been hospitalized from 05-13-18 through 05-27-18. He had a fall at home hitting his head. He is on long term coumadin therapy for afib. He was found to have subdural hematoma left greater than right. His coumadin is on hold at this time and will not restart anticoagulation therapy until instructed by neurosurgery. He also has a left proximal humeral fracture. He did have a reverse shoulder arthroplasty on 05-19-18. He is here for short tem rehab with his goal to return back home. He is unable to participate in the hpi or ros due to lethargy. There are no reports of uncontrolled pain; he did eat breakfast this AM; no indications of anxiety or agitation. He will continue to be followed for his chronic illnesses including: chf; cad; hypertensive heart disease.   Past Medical History:  Diagnosis Date  . Bradycardia    a. during 02/2015 admission - occasional HR in 40s while being treated for atrial fib.  . Cervical disc disease   . Chronic diastolic CHF (congestive heart failure) ([place])    a. Dx 02/2015 -  2D echo 03/02/15 showed severe focal basal hypertrophy of the septum, EF 55-60%, mild MR, no effusion.  . CKD (chronic kidney disease), stage III ([place])   . Coronary artery calcification seen on CT scan    a. Nuc 02/2015 was normal.  . Dyslipidemia   . GERD (gastroesophageal reflux disease)     . Hypertension   . 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 PAF (paroxysmal atrial fibrillation) ([place])    a. Dx 02/2015 - in and out on tele, rx'd Coumadin and amiodarone.  . Pneumonia 2007  . Shortness of breath dyspnea    WITH EXERTION  . Type II diabetes mellitus ([place])     Past Surgical History:  Procedure Laterality Date  . CATARACT EXTRACTION W/ INTRAOCULAR LENS  IMPLANT, BILATERAL Bilateral   . CERVICAL DISC SURGERY     "i've had 3 neck ORs; not sure what kind; all thru the back of my neck"  . CHOLECYSTECTOMY    . COLONOSCOPY WITH PROPOFOL N/A 01/02/2016   Procedure: COLONOSCOPY WITH PROPOFOL;  Surgeon: [name], MD;  Location: [facility];  Service: Endoscopy;  Laterality: N/A;  . EYE SURGERY  1930's   "don't know what for" (05/11/2013)  . JOINT REPLACEMENT     bilateral knees, elbows, and shoulders (on 05/11/2013 pt denies all joint replacements"   . KNEE ARTHROPLASTY     "had one scope; one open knee OR; not sure which on which side" (05/10/2013)  . KNEE ARTHROSCOPY     "had one scope; one open knee OR; not sure which on which side" (05/10/2013)  . POSTERIOR FUSION CERVICAL SPINE    . REVERSE SHOULDER ARTHROPLASTY Left 05/19/2018  Procedure: REVERSE SHOULDER ARTHROPLASTY;  Surgeon: [name], MD;  Location: [facility];  Service: Orthopedics;  Laterality: Left;  . ROTATOR CUFF REPAIR Bilateral   . VIDEO BRONCHOSCOPY Bilateral 02/01/2016   Procedure: VIDEO BRONCHOSCOPY WITHOUT FLUORO;  Surgeon: Marshell [name], MD;  Location: [facility];  Service: Cardiopulmonary;  Laterality: Bilateral;    Social History   Socioeconomic History  . Marital status: Married    Spouse name: Vaughan Basta  . Number of children: 0  . Years of education: Not on file  . Highest education level: Not on file  Occupational History  . Occupation: Retired from Architect work.  Social Needs  . Financial resource strain: Not on file  .  Food insecurity:    Worry: Not on file    Inability: Not on file  . Transportation needs:    Medical: Not on file    Non-medical: Not on file  Tobacco Use  . Smoking status: Never Smoker  . Smokeless tobacco: Former Systems developer    Types: Chew  Substance and Sexual Activity  . Alcohol use: No  . Drug use: No    Comment: used chew when I was a teenager per patient   . Sexual activity: Not on file  Lifestyle  . Physical activity:    Days per week: Not on file    Minutes per session: Not on file  . Stress: Not on file  Relationships  . Social connections:    Talks on phone: Not on file    Gets together: Not on file    Attends religious service: Not on file    Active member of club or organization: Not on file    Attends meetings of clubs or organizations: Not on file    Relationship status: Not on file  . Intimate partner violence:    Fear of current or ex partner: Not on file    Emotionally abused: Not on file    Physically abused: Not on file    Forced sexual activity: Not on file  Other Topics Concern  . Not on file  Social History Narrative   Married.  Lives with wife.  Ambulates independently.  Has a dog names is named Programmer, applications  .   Retired - worked for [employer] paper   No Sport and exercise psychologist Pulmonary:   No known bird, mold, or hot tub exposure.   Family History  Problem Relation Age of Onset  . Diabetes Mellitus II Paternal Grandmother   . Heart disease Neg Hx        He does not know his father's history.   . Cancer Neg Hx   . Diabetes Neg Hx   . CAD Neg Hx   . Lung disease Neg Hx   . Rheumatologic disease Neg Hx       VITAL SIGNS BP (!) 159/45   Pulse 66   Temp 98.1 F (36.7 C)   Resp 20   Ht 5' 7.99" (1.727 m)   Wt 194 lb (88 kg)   SpO2 98%   BMI 29.50 kg/m   Outpatient Encounter Medications as of 05/28/2018  Medication Sig Note  . albuterol (PROVENTIL) (2.5 MG/3ML) 0.083% nebulizer solution Take 3 mLs (2.5 mg total) by nebulization  every 4 (four) hours as needed for wheezing or shortness of breath.   Marland Kitchen atorvastatin (LIPITOR) 80 MG tablet Take 40 mg by mouth every evening.    . bisacodyl (DULCOLAX) 10 MG suppository Place 1 suppository (10  mg total) rectally daily as needed for moderate constipation.   . Cholecalciferol (VITAMIN D-3) 25 MCG (1000 UT) CAPS Take 1 capsule (1,000 Units total) by mouth daily.   Marland Kitchen docusate sodium (COLACE) 100 MG capsule Take 1 capsule (100 mg total) by mouth 2 (two) times daily.   . furosemide (LASIX) 40 MG tablet Take 1 tablet (40 mg total) by mouth daily.   . hydrALAZINE (APRESOLINE) 25 MG tablet Take 1 tablet (25 mg total) by mouth every 8 (eight) hours.   . hydrocortisone cream 1 % Apply topically 2 (two) times daily. Apply to itchy areas on the left arm and chest   . insulin aspart (NOVOLOG) 100 UNIT/ML injection Inject 0-9 Units into the skin 3 (three) times daily with meals. Sliding scale CBG 70 - 120: 0 units CBG 121 - 150: 1 unit,  CBG 151 - 200: 2 units,  CBG 201 - 250: 3 units,  CBG 251 - 300: 5 units,  CBG 301 - 350: 7 units,  CBG 351 - 400: 9 units   CBG > 400: 9 units and notify your MD   . insulin detemir (LEVEMIR) 100 UNIT/ML injection Inject 0.26 mLs (26 Units total) into the skin at bedtime.   . isosorbide mononitrate (IMDUR) 60 MG 24 hr tablet Take 1 tablet (60 mg total) by mouth daily.   Marland Kitchen LORazepam (ATIVAN) 0.5 MG tablet Take 1 tablet (0.5 mg total) by mouth every 8 (eight) hours as needed for anxiety.   . metoprolol (LOPRESSOR) 50 MG tablet Take 50 mg by mouth 2 (two) times daily.   . NON FORMULARY Diet Type:  NAS, NCS   . omeprazole (PRILOSEC) 20 MG capsule Take 20 mg by mouth daily.  11/22/2014: .  . oxyCODONE-acetaminophen (PERCOCET/ROXICET) 5-325 MG tablet Take 1 tablet by mouth every 6 (six) hours as needed for moderate pain or severe pain.   . OXYGEN Inhale 3 L/min into the lungs continuous.   . polyethylene glycol (MIRALAX / GLYCOLAX) packet Take 17 g by mouth daily as  needed for mild constipation.   . sodium chloride (OCEAN) 0.65 % SOLN nasal spray Place 1 spray into both nostrils as needed for congestion.   . vitamin B-12 (CYANOCOBALAMIN) 1000 MCG tablet Take 1 tablet (1,000 mcg total) by mouth daily.    No facility-administered encounter medications on file as of 05/28/2018.      SIGNIFICANT DIAGNOSTIC EXAMS  TODAY:   05-13-18: ct of head:  1. Acute, left greater than right subdural hematomas measuring up to10 mm in thickness on the left overlying the left frontal, temporaland parietal lobes and up to 3 mm overlying the right temporal lobe. No midline shift  05-13-18: ct of left shoulder:  Neer category 2 part fracture of the proximal left humerus with one shaft width anterior displacement of the humeral shaft relative to the humeral head. Nondisplaced fracture undermining the greater tuberosity is identified. No joint dislocation is seen.  05-14-18: ct of head:  1. Decreased size of bilateral acute subdural hematomas measuring to 6 mm on the LEFT. No midline shift. 2. Image findings of chronic communicating hydrocephalus.  05-19-18: left shoulder x-ray: Interval left shoulder replacement for proximal humerus fracture with expected postsurgical changes  05-23-18: chest x-ray: 1. Diminished aeration to left base compared with prior exam. 2. Suspect small right pleural effusion.  05-25-18: 2-d echo: - Left ventricle: The cavity size was normal. Systolic function was vigorous. The estimated ejection fraction was in the range of 65% to  70%. Wall motion was normal; there were no regional wall motion abnormalities. Doppler parameters are consistent with abnormal left ventricular relaxation (grade 1 diastolic dysfunction). - Aortic valve: Trileaflet; mildly thickened, mildly calcified leaflets.  LABS REVIEWED: TODAY;   05-13-18: wbc 19.0; hgb 14.6; hct 46.5; mcv 96.1 plt 243 glucose 186; bun 31; creat 1.66; k+ 5.5; na++ 140; ca 9.6 liver normal albumin  3.4 blood culture: no growth  05-15-18; wbc 15.5; hgb 11.3; hct 36.4; mcv 97.6 plt 199 glucose 194; bun 50; creat 2.90; k+ 4.9; na++ 138  05-18-18: glucose 115; bun 33; creat 1.44; k+ 4.3; na++ 136; ca 8.2  05-27-18: wbc 13.1; hgb 10.5; hct 33.3; mcv 93.;8 plt 327;glucose 86; bun 39; creat 1.93; k+ 3.8; na++ 136; ca 8.8   Review of Systems  Unable to perform ROS: Medical condition (is lethargic)    Physical Exam Constitutional:      General: He is not in acute distress.    Appearance: Normal appearance. He is well-developed. He is not diaphoretic.  Neck:     Musculoskeletal: Neck supple.     Thyroid: No thyromegaly.  Cardiovascular:     Rate and Rhythm: Normal rate and regular rhythm.     Pulses: Normal pulses.     Heart sounds: Normal heart sounds.  Pulmonary:     Effort: Pulmonary effort is normal. No respiratory distress.     Breath sounds: Normal breath sounds.     Comments: 02 dependent  Abdominal:     General: Bowel sounds are normal. There is no distension.     Palpations: Abdomen is soft.     Tenderness: There is no abdominal tenderness.  Musculoskeletal:     Right lower leg: No edema.     Left lower leg: No edema.     Comments: Left shoulder in sling   Lymphadenopathy:     Cervical: No cervical adenopathy.  Skin:    General: Skin is warm and dry.  Neurological:     Comments: Is aware      ASSESSMENT/ PLAN:  TODAY:   1. Chronic diastolic CHF (congestive heart failure): is stable EF 65-70% (05-25-18): will continue lasix 40 mg daily and lopressor 50 mg twice daily and imdur 60 mg daily   2. Coronary artery calcification seen on CT scan: is stable will continue imdur 60 mg daily   3. Hypertensive heart and kidney disease with chronic diastolic congestive heart failure nad stage 3 chronic kidney disease: is stable b/p 159/45: will continue apresoline 35 mg every 8 hours; imdur 60 mg daily lopressor 50 mg twice daily   4. Dyslipidemia associated with type 2 diabetes  mellitus: is stable will continue lipitor 40 mg daily   5. Pulmonary fibrosis: is stable is 02 dependent at 3L/min at this time was not using prior to hospitalization. Will continue albuterol neb treatment four times daily as needed;   6. GERD without esophagitis: is stable will continue prilosec 20 mg daily   7. Chronic constipation: is stable will continue colace twice daily and miralax daily as needed   8.  PAF( paroxysmal atrial fibrillation): heart rate stable will continue lopressor 50 mg twice daily for rate control; is not on anticoagulation due to subdural hematoma  9. Type 2 diabetes mellitus with stage 3 chronic kidney disease with long term current use of insulin: is stable will continue levemir 26 units nightly novolog SSI: 151-200: 2 units; 201-250: 3 units; 251-300: 5 units; 301-350: 7 units; 351-400: 9 units;  10. SDH: (subdural hematoma): is neurologically stable; will continue to monitor and will follow up with neurosurgery as indicated.   11. CKD stage 3 due to type 2 diabetes mellitus: is stable bun 39; creat 1.93  12. Closed displaced comminuted fracture of left of left humerus sequela: is status post reverse shoulder arthroplasty on 05-19-18. Will continue therapy as directed and will follow up with orthopedics as directed. Will continue percocet 5/325 mg every 6 hours as needed       MD is aware of resident's narcotic use and is in agreement with current plan of care. We will attempt to wean resident as apropriate   Ok Edwards NP [facility]  Contact [phone] Monday through Friday 8am- 5pm  After hours call [phone]

## 2018-06-01 ENCOUNTER — Non-Acute Institutional Stay (SKILLED_NURSING_FACILITY): Payer: Medicare Other | Admitting: Adult Health

## 2018-06-01 ENCOUNTER — Encounter: Payer: Self-pay | Admitting: Adult Health

## 2018-06-01 DIAGNOSIS — E785 Hyperlipidemia, unspecified: Secondary | ICD-10-CM

## 2018-06-01 DIAGNOSIS — I5032 Chronic diastolic (congestive) heart failure: Secondary | ICD-10-CM

## 2018-06-01 DIAGNOSIS — E1169 Type 2 diabetes mellitus with other specified complication: Secondary | ICD-10-CM | POA: Insufficient documentation

## 2018-06-01 DIAGNOSIS — M109 Gout, unspecified: Secondary | ICD-10-CM

## 2018-06-01 DIAGNOSIS — K5909 Other constipation: Secondary | ICD-10-CM | POA: Insufficient documentation

## 2018-06-01 DIAGNOSIS — N183 Chronic kidney disease, stage 3 unspecified: Secondary | ICD-10-CM | POA: Insufficient documentation

## 2018-06-01 DIAGNOSIS — I130 Hypertensive heart and chronic kidney disease with heart failure and stage 1 through stage 4 chronic kidney disease, or unspecified chronic kidney disease: Secondary | ICD-10-CM

## 2018-06-01 DIAGNOSIS — E1165 Type 2 diabetes mellitus with hyperglycemia: Secondary | ICD-10-CM | POA: Insufficient documentation

## 2018-06-01 DIAGNOSIS — E1122 Type 2 diabetes mellitus with diabetic chronic kidney disease: Secondary | ICD-10-CM | POA: Insufficient documentation

## 2018-06-01 NOTE — Progress Notes (Signed)
[facility]   [facility] Room Number: [place] of Service:  SNF (31)   CODE STATUS: Full Code  Allergies  Allergen Reactions  . 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 Patient subsequently taken off amiodarone.  . Nifedipine Other (See Comments)    Reaction: made gums swell up. Pt states that he had to have gum surgery after taking.     Chief Complaint  Patient presents with  . Acute Visit    Left hand and right foot swelling    HPI:  For the past couple of days he has right great toe swelling and bilateral hand swelling. He states that his hands feel better; but that his toe is getting worse. He states that he pain is severe and he is concerned that he is having gout.  He has no known history of gout.    Past Medical History:  Diagnosis Date  . Bradycardia    a. during 02/2015 admission - occasional HR in 40s while being treated for atrial fib.  . Cervical disc disease   . Chronic diastolic CHF (congestive heart failure) ([place])    a. Dx 02/2015 -  2D echo 03/02/15 showed severe focal basal hypertrophy of the septum, EF 55-60%, mild MR, no effusion.  . CKD (chronic kidney disease), stage III ([place])   . Coronary artery calcification seen on CT scan    a. Nuc 02/2015 was normal.  . Dyslipidemia   . GERD (gastroesophageal reflux disease)   . Hypertension   . 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 PAF (paroxysmal atrial fibrillation) ([place])    a. Dx 02/2015 - in and out on tele, rx'd Coumadin and amiodarone.  . Pneumonia 2007  . Shortness of breath dyspnea    WITH EXERTION  . Type II diabetes mellitus ([place])     Past Surgical History:  Procedure Laterality Date  . CATARACT EXTRACTION W/ INTRAOCULAR LENS  IMPLANT, BILATERAL Bilateral   . CERVICAL DISC SURGERY     "i've had 3 neck ORs; not sure  what kind; all thru the back of my neck"  . CHOLECYSTECTOMY    . COLONOSCOPY WITH PROPOFOL N/A 01/02/2016   Procedure: COLONOSCOPY WITH PROPOFOL;  Surgeon: [name], MD;  Location: [facility];  Service: Endoscopy;  Laterality: N/A;  . EYE SURGERY  1930's   "don't know what for" (05/11/2013)  . JOINT REPLACEMENT     bilateral knees, elbows, and shoulders (on 05/11/2013 pt denies all joint replacements"   . KNEE ARTHROPLASTY     "had one scope; one open knee OR; not sure which on which side" (05/10/2013)  . KNEE ARTHROSCOPY     "had one scope; one open knee OR; not sure which on which side" (05/10/2013)  . POSTERIOR FUSION CERVICAL SPINE    . REVERSE SHOULDER ARTHROPLASTY Left 05/19/2018   Procedure: REVERSE SHOULDER ARTHROPLASTY;  Surgeon: [name], MD;  Location: [facility];  Service: Orthopedics;  Laterality: Left;  . ROTATOR CUFF REPAIR Bilateral   . VIDEO BRONCHOSCOPY Bilateral 02/01/2016   Procedure: VIDEO BRONCHOSCOPY WITHOUT FLUORO;  Surgeon: Marshell [name], MD;  [facility];  Service: Cardiopulmonary;  Laterality: Bilateral;    Social History   Socioeconomic History  . Marital status: Married    Spouse name: Vaughan Basta  . Number of children: 0  .  Years of education: Not on file  . Highest education level: Not on file  Occupational History  . Occupation: Retired from Architect work.  Social Needs  . Financial resource strain: Not on file  . Food insecurity:    Worry: Not on file    Inability: Not on file  . Transportation needs:    Medical: Not on file    Non-medical: Not on file  Tobacco Use  . Smoking status: Never Smoker  . Smokeless tobacco: Former Systems developer    Types: Chew  Substance and Sexual Activity  . Alcohol use: No  . Drug use: No    Comment: used chew when I was a teenager per patient   . Sexual activity: Not on file  Lifestyle  . Physical activity:    Days per week: Not on file    Minutes per session: Not on file  . Stress: Not  on file  Relationships  . Social connections:    Talks on phone: Not on file    Gets together: Not on file    Attends religious service: Not on file    Active member of club or organization: Not on file    Attends meetings of clubs or organizations: Not on file    Relationship status: Not on file  . Intimate partner violence:    Fear of current or ex partner: Not on file    Emotionally abused: Not on file    Physically abused: Not on file    Forced sexual activity: Not on file  Other Topics Concern  . Not on file  Social History Narrative   Married.  Lives with wife.  Ambulates independently.  Has a dog names is named Programmer, applications  .   Retired - worked for [employer] paper   No Sport and exercise psychologist Pulmonary:   No known bird, mold, or hot tub exposure.   Family History  Problem Relation Age of Onset  . Diabetes Mellitus II Paternal Grandmother   . Heart disease Neg Hx        He does not know his father's history.   . Cancer Neg Hx   . Diabetes Neg Hx   . CAD Neg Hx   . Lung disease Neg Hx   . Rheumatologic disease Neg Hx       VITAL SIGNS Pulse 64   Temp 98.9 F (37.2 C)   Resp 18   Ht 5' 7.99" (1.727 m)   Wt 208 lb (94.3 kg)   SpO2 99%   BMI 31.63 kg/m   Outpatient Encounter Medications as of 06/01/2018  Medication Sig Note  . albuterol (PROVENTIL) (2.5 MG/3ML) 0.083% nebulizer solution Take 3 mLs (2.5 mg total) by nebulization every 4 (four) hours as needed for wheezing or shortness of breath.   Marland Kitchen atorvastatin (LIPITOR) 80 MG tablet Take 40 mg by mouth every evening.    . bisacodyl (DULCOLAX) 10 MG suppository Place 1 suppository (10 mg total) rectally daily as needed for moderate constipation.   . Cholecalciferol (VITAMIN D-3) 25 MCG (1000 UT) CAPS Take 1 capsule (1,000 Units total) by mouth daily.   Marland Kitchen docusate sodium (COLACE) 100 MG capsule Take 1 capsule (100 mg total) by mouth 2 (two) times daily.   . furosemide (LASIX) 40 MG tablet Take 1 tablet  (40 mg total) by mouth daily.   . hydrALAZINE (APRESOLINE) 25 MG tablet Take 1 tablet (25 mg total) by mouth every 8 (eight) hours.   Marland Kitchen  hydrocortisone cream 1 % Apply topically 2 (two) times daily. Apply to itchy areas on the left arm and chest   . insulin aspart (NOVOLOG) 100 UNIT/ML injection Inject 0-9 Units into the skin 3 (three) times daily with meals. Sliding scale CBG 70 - 120: 0 units CBG 121 - 150: 1 unit,  CBG 151 - 200: 2 units,  CBG 201 - 250: 3 units,  CBG 251 - 300: 5 units,  CBG 301 - 350: 7 units,  CBG 351 - 400: 9 units   CBG > 400: 9 units and notify your MD   . insulin detemir (LEVEMIR) 100 UNIT/ML injection Inject 0.26 mLs (26 Units total) into the skin at bedtime.   . isosorbide mononitrate (IMDUR) 60 MG 24 hr tablet Take 1 tablet (60 mg total) by mouth daily.   Marland Kitchen LORazepam (ATIVAN) 0.5 MG tablet Take 1 tablet (0.5 mg total) by mouth every 8 (eight) hours as needed for anxiety.   . metoprolol (LOPRESSOR) 50 MG tablet Take 50 mg by mouth 2 (two) times daily.   . NON FORMULARY Diet Type:  NAS, NCS   . omeprazole (PRILOSEC) 20 MG capsule Take 20 mg by mouth daily.  11/22/2014: .  . oxyCODONE-acetaminophen (PERCOCET/ROXICET) 5-325 MG tablet Take 1 tablet by mouth every 6 (six) hours as needed for moderate pain or severe pain.   . OXYGEN Inhale 3 L/min into the lungs continuous.   . polyethylene glycol (MIRALAX / GLYCOLAX) packet Take 17 g by mouth daily as needed for mild constipation.   . sodium chloride (OCEAN) 0.65 % SOLN nasal spray Place 1 spray into both nostrils as needed for congestion.   . vitamin B-12 (CYANOCOBALAMIN) 1000 MCG tablet Take 1 tablet (1,000 mcg total) by mouth daily.    No facility-administered encounter medications on file as of 06/01/2018.      SIGNIFICANT DIAGNOSTIC EXAMS  PREVIOUS:   05-13-18: ct of head:  1. Acute, left greater than right subdural hematomas measuring up to10 mm in thickness on the left overlying the left frontal, temporaland  parietal lobes and up to 3 mm overlying the right temporal lobe. No midline shift  05-13-18: ct of left shoulder:  Neer category 2 part fracture of the proximal left humerus with one shaft width anterior displacement of the humeral shaft relative to the humeral head. Nondisplaced fracture undermining the greater tuberosity is identified. No joint dislocation is seen.  05-14-18: ct of head:  1. Decreased size of bilateral acute subdural hematomas measuring to 6 mm on the LEFT. No midline shift. 2. Image findings of chronic communicating hydrocephalus.  05-19-18: left shoulder x-ray: Interval left shoulder replacement for proximal humerus fracture with expected postsurgical changes  05-23-18: chest x-ray: 1. Diminished aeration to left base compared with prior exam. 2. Suspect small right pleural effusion.  05-25-18: 2-d echo: - Left ventricle: The cavity size was normal. Systolic function was vigorous. The estimated ejection fraction was in the range of 65% to 70%. Wall motion was normal; there were no regional wall motion abnormalities. Doppler parameters are consistent with abnormal left ventricular relaxation (grade 1 diastolic dysfunction). - Aortic valve: Trileaflet; mildly thickened, mildly calcified leaflets.  NO NEW EXAMS.   LABS REVIEWED: PREVIOUS;   05-13-18: wbc 19.0; hgb 14.6; hct 46.5; mcv 96.1 plt 243 glucose 186; bun 31; creat 1.66; k+ 5.5; na++ 140; ca 9.6 liver normal albumin 3.4 blood culture: no growth  05-15-18; wbc 15.5; hgb 11.3; hct 36.4; mcv 97.6 plt 199 glucose 194;  bun 50; creat 2.90; k+ 4.9; na++ 138  05-18-18: glucose 115; bun 33; creat 1.44; k+ 4.3; na++ 136; ca 8.2  05-27-18: wbc 13.1; hgb 10.5; hct 33.3; mcv 93.;8 plt 327;glucose 86; bun 39; creat 1.93; k+ 3.8; na++ 136; ca 8.8   NO NEW LABS   Review of Systems  Constitutional: Negative for malaise/fatigue.  Respiratory: Negative for cough and shortness of breath.   Cardiovascular: Negative for chest pain,  palpitations and leg swelling.  Gastrointestinal: Negative for abdominal pain, constipation and heartburn.  Musculoskeletal: Positive for joint pain. Negative for back pain and myalgias.       Right great toe with pain and swelling present   Skin: Negative.   Neurological: Negative for dizziness.  Psychiatric/Behavioral: The patient is not nervous/anxious.     Physical Exam Constitutional:      General: He is not in acute distress.    Appearance: Normal appearance. He is well-developed. He is not diaphoretic.  Neck:     Musculoskeletal: Neck supple.     Thyroid: No thyromegaly.  Cardiovascular:     Rate and Rhythm: Normal rate and regular rhythm.     Pulses: Normal pulses.     Heart sounds: Normal heart sounds.  Pulmonary:     Effort: Pulmonary effort is normal. No respiratory distress.     Breath sounds: Normal breath sounds.     Comments: 02 dependent  Abdominal:     General: Bowel sounds are normal. There is no distension.     Palpations: Abdomen is soft.     Tenderness: There is no abdominal tenderness.  Musculoskeletal:     Right lower leg: No edema.     Left lower leg: No edema.     Comments: Is able to move all extremities Left arm in sling  Right great toe is red hot and swollen.   Lymphadenopathy:     Cervical: No cervical adenopathy.  Skin:    General: Skin is warm and dry.  Neurological:     Mental Status: He is alert. Mental status is at baseline.  Psychiatric:        Mood and Affect: Mood normal.     ASSESSMENT/ PLAN:  TODAY:   1. gouty arthritis right great toe: is worse: will being colchicine 0.6 mg twice daily through 06-08-18 and will begin uloric 40 mg daily will check uric acid level.          MD is aware of resident's narcotic use and is in agreement with current plan of care. We will attempt to wean resident as apropriate   Ok Edwards NP [facility]  Contact [phone] Monday through Friday 8am- 5pm  After hours call  [phone]

## 2018-06-02 ENCOUNTER — Encounter: Payer: Self-pay | Admitting: Adult Health

## 2018-06-02 ENCOUNTER — Other Ambulatory Visit
Admission: RE | Admit: 2018-06-02 | Discharge: 2018-06-02 | Disposition: A | Discharge status: Home / Self Care | Payer: Medicare Other | Source: Ambulatory Visit | Attending: Adult Health | Admitting: Adult Health

## 2018-06-02 ENCOUNTER — Non-Acute Institutional Stay (SKILLED_NURSING_FACILITY): Payer: Medicare Other | Admitting: Adult Health

## 2018-06-02 DIAGNOSIS — S065X9A Traumatic subdural hemorrhage with loss of consciousness of unspecified duration, initial encounter: Secondary | ICD-10-CM | POA: Diagnosis not present

## 2018-06-02 DIAGNOSIS — M109 Gout, unspecified: Secondary | ICD-10-CM

## 2018-06-02 DIAGNOSIS — S42352S Displaced comminuted fracture of shaft of humerus, left arm, sequela: Secondary | ICD-10-CM | POA: Diagnosis not present

## 2018-06-02 DIAGNOSIS — S065XAA Traumatic subdural hemorrhage with loss of consciousness status unknown, initial encounter: Secondary | ICD-10-CM

## 2018-06-02 LAB — URIC ACID: Uric Acid, Serum: 10.1 mg/dL — ABNORMAL HIGH (ref 3.7–8.6)

## 2018-06-02 NOTE — Progress Notes (Signed)
[facility]   [facility] Room Number: [place] of Service:  SNF (31)   CODE STATUS: Full Code  Allergies  Allergen Reactions  . 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 Patient subsequently taken off amiodarone.  . Nifedipine Other (See Comments)    Reaction: made gums swell up. Pt states that he had to have gum surgery after taking.     Chief Complaint  Patient presents with  . Acute Visit    Care Plan Meeting    HPI:  We have come together for his routine care plan meeting; he does have family present. He continues to require maximum assistance with therapy and his adls. He scored 7/30 on mmse. He will more than likely require SNF long term. His gout pain is improving. He denies any uncontrolled pain; no changes in appetite; no anxiety no insomnia.   Past Medical History:  Diagnosis Date  . Bradycardia    a. during 02/2015 admission - occasional HR in 40s while being treated for atrial fib.  . Cervical disc disease   . Chronic diastolic CHF (congestive heart failure) ([place])    a. Dx 02/2015 -  2D echo 03/02/15 showed severe focal basal hypertrophy of the septum, EF 55-60%, mild MR, no effusion.  . CKD (chronic kidney disease), stage III ([place])   . Coronary artery calcification seen on CT scan    a. Nuc 02/2015 was normal.  . Dyslipidemia   . GERD (gastroesophageal reflux disease)   . Hypertension   . 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 PAF (paroxysmal atrial fibrillation) ([place])    a. Dx 02/2015 - in and out on tele, rx'd Coumadin and amiodarone.  . Pneumonia 2007  . Shortness of breath dyspnea    WITH EXERTION  . Type II diabetes mellitus ([place])     Past Surgical History:  Procedure Laterality Date  . CATARACT EXTRACTION W/ INTRAOCULAR LENS  IMPLANT, BILATERAL Bilateral   . CERVICAL DISC  SURGERY     "i've had 3 neck ORs; not sure what kind; all thru the back of my neck"  . CHOLECYSTECTOMY    . COLONOSCOPY WITH PROPOFOL N/A 01/02/2016   Procedure: COLONOSCOPY WITH PROPOFOL;  Surgeon: [name], MD;  [facility]: [facility];  Service: Endoscopy;  Laterality: N/A;  . EYE SURGERY  1930's   "don't know what for" (05/11/2013)  . JOINT REPLACEMENT     bilateral knees, elbows, and shoulders (on 05/11/2013 pt denies all joint replacements"   . KNEE ARTHROPLASTY     "had one scope; one open knee OR; not sure which on which side" (05/10/2013)  . KNEE ARTHROSCOPY     "had one scope; one open knee OR; not sure which on which side" (05/10/2013)  . POSTERIOR FUSION CERVICAL SPINE    . REVERSE SHOULDER ARTHROPLASTY Left 05/19/2018   Procedure: REVERSE SHOULDER ARTHROPLASTY;  Surgeon: [name], MD;  Location: [place];  Service: Orthopedics;  Laterality: Left;  . ROTATOR CUFF REPAIR Bilateral   . VIDEO BRONCHOSCOPY Bilateral 02/01/2016   Procedure: VIDEO BRONCHOSCOPY WITHOUT FLUORO;  Surgeon: Marshell [name], MD;  [facility]: [facility];  Service: Cardiopulmonary;  Laterality: Bilateral;    Social History   Socioeconomic History  . Marital status: Married    Spouse name: Vaughan Basta  . Number of children: 0  .  Years of education: Not on file  . Highest education level: Not on file  Occupational History  . Occupation: Retired from Architect work.  Social Needs  . Financial resource strain: Not on file  . Food insecurity:    Worry: Not on file    Inability: Not on file  . Transportation needs:    Medical: Not on file    Non-medical: Not on file  Tobacco Use  . Smoking status: Never Smoker  . Smokeless tobacco: Former Systems developer    Types: Chew  Substance and Sexual Activity  . Alcohol use: No  . Drug use: No    Comment: used chew when I was a teenager per patient   . Sexual activity: Not on file  Lifestyle  . Physical activity:    Days per week: Not on file     Minutes per session: Not on file  . Stress: Not on file  Relationships  . Social connections:    Talks on phone: Not on file    Gets together: Not on file    Attends religious service: Not on file    Active member of club or organization: Not on file    Attends meetings of clubs or organizations: Not on file    Relationship status: Not on file  . Intimate partner violence:    Fear of current or ex partner: Not on file    Emotionally abused: Not on file    Physically abused: Not on file    Forced sexual activity: Not on file  Other Topics Concern  . Not on file  Social History Narrative   Married.  Lives with wife.  Ambulates independently.  Has a dog names is named Programmer, applications  .   Retired - worked for [employer] paper   No Sport and exercise psychologist Pulmonary:   No known bird, mold, or hot tub exposure.   Family History  Problem Relation Age of Onset  . Diabetes Mellitus II Paternal Grandmother   . Heart disease Neg Hx        He does not know his father's history.   . Cancer Neg Hx   . Diabetes Neg Hx   . CAD Neg Hx   . Lung disease Neg Hx   . Rheumatologic disease Neg Hx       VITAL SIGNS BP (!) 164/62   Pulse 60   Temp 98.9 F (37.2 C)   Resp 18   Ht 5' 7.99" (1.727 m)   Wt 194 lb 9.6 oz (88.3 kg)   SpO2 98%   BMI 29.60 kg/m   Outpatient Encounter Medications as of 06/02/2018  Medication Sig Note  . albuterol (PROVENTIL) (2.5 MG/3ML) 0.083% nebulizer solution Take 3 mLs (2.5 mg total) by nebulization every 4 (four) hours as needed for wheezing or shortness of breath.   Marland Kitchen atorvastatin (LIPITOR) 80 MG tablet Take 40 mg by mouth every evening.    . bisacodyl (DULCOLAX) 10 MG suppository Place 1 suppository (10 mg total) rectally daily as needed for moderate constipation.   . Cholecalciferol (VITAMIN D-3) 25 MCG (1000 UT) CAPS Take 1 capsule (1,000 Units total) by mouth daily.   . colchicine 0.6 MG tablet Take 0.6 mg by mouth 2 (two) times daily.   Marland Kitchen  docusate sodium (COLACE) 100 MG capsule Take 1 capsule (100 mg total) by mouth 2 (two) times daily.   . febuxostat (ULORIC) 40 MG tablet Take 40 mg by mouth daily.   Marland Kitchen  furosemide (LASIX) 40 MG tablet Take 1 tablet (40 mg total) by mouth daily.   . hydrALAZINE (APRESOLINE) 25 MG tablet Take 1 tablet (25 mg total) by mouth every 8 (eight) hours.   . hydrocortisone cream 1 % Apply topically 2 (two) times daily. Apply to itchy areas on the left arm and chest   . insulin aspart (NOVOLOG) 100 UNIT/ML injection Inject 0-9 Units into the skin 3 (three) times daily with meals. Sliding scale CBG 70 - 120: 0 units CBG 121 - 150: 1 unit,  CBG 151 - 200: 2 units,  CBG 201 - 250: 3 units,  CBG 251 - 300: 5 units,  CBG 301 - 350: 7 units,  CBG 351 - 400: 9 units   CBG > 400: 9 units and notify your MD   . insulin detemir (LEVEMIR) 100 UNIT/ML injection Inject 0.26 mLs (26 Units total) into the skin at bedtime.   . isosorbide mononitrate (IMDUR) 60 MG 24 hr tablet Take 1 tablet (60 mg total) by mouth daily.   Marland Kitchen LORazepam (ATIVAN) 0.5 MG tablet Take 1 tablet (0.5 mg total) by mouth every 8 (eight) hours as needed for anxiety.   . metoprolol (LOPRESSOR) 50 MG tablet Take 50 mg by mouth 2 (two) times daily.   . NON FORMULARY Diet Type:  NAS, NCS   . omeprazole (PRILOSEC) 20 MG capsule Take 20 mg by mouth daily.  11/22/2014: .  . oxyCODONE-acetaminophen (PERCOCET/ROXICET) 5-325 MG tablet Take 1 tablet by mouth every 6 (six) hours as needed for moderate pain or severe pain.   . OXYGEN Inhale 3 L/min into the lungs continuous.   . polyethylene glycol (MIRALAX / GLYCOLAX) packet Take 17 g by mouth daily as needed for mild constipation.   . sodium chloride (OCEAN) 0.65 % SOLN nasal spray Place 1 spray into both nostrils as needed for congestion.   . vitamin B-12 (CYANOCOBALAMIN) 1000 MCG tablet Take 1 tablet (1,000 mcg total) by mouth daily.    No facility-administered encounter medications on file as of 06/02/2018.       SIGNIFICANT DIAGNOSTIC EXAMS   PREVIOUS:   05-13-18: ct of head:  1. Acute, left greater than right subdural hematomas measuring up to10 mm in thickness on the left overlying the left frontal, temporaland parietal lobes and up to 3 mm overlying the right temporal lobe. No midline shift  05-13-18: ct of left shoulder:  Neer category 2 part fracture of the proximal left humerus with one shaft width anterior displacement of the humeral shaft relative to the humeral head. Nondisplaced fracture undermining the greater tuberosity is identified. No joint dislocation is seen.  05-14-18: ct of head:  1. Decreased size of bilateral acute subdural hematomas measuring to 6 mm on the LEFT. No midline shift. 2. Image findings of chronic communicating hydrocephalus.  05-19-18: left shoulder x-ray: Interval left shoulder replacement for proximal humerus fracture with expected postsurgical changes  05-23-18: chest x-ray: 1. Diminished aeration to left base compared with prior exam. 2. Suspect small right pleural effusion.  05-25-18: 2-d echo: - Left ventricle: The cavity size was normal. Systolic function was vigorous. The estimated ejection fraction was in the range of 65% to 70%. Wall motion was normal; there were no regional wall motion abnormalities. Doppler parameters are consistent with abnormal left ventricular relaxation (grade 1 diastolic dysfunction). - Aortic valve: Trileaflet; mildly thickened, mildly calcified leaflets.  NO NEW EXAMS.   LABS REVIEWED: PREVIOUS;   05-13-18: wbc 19.0; hgb 14.6; hct 46.5;  mcv 96.1 plt 243 glucose 186; bun 31; creat 1.66; k+ 5.5; na++ 140; ca 9.6 liver normal albumin 3.4 blood culture: no growth  05-15-18; wbc 15.5; hgb 11.3; hct 36.4; mcv 97.6 plt 199 glucose 194; bun 50; creat 2.90; k+ 4.9; na++ 138  05-18-18: glucose 115; bun 33; creat 1.44; k+ 4.3; na++ 136; ca 8.2  05-27-18: wbc 13.1; hgb 10.5; hct 33.3; mcv 93.;8 plt 327;glucose 86; bun 39; creat  1.93; k+ 3.8; na++ 136; ca 8.8   TODAY:   06-02-18: uric acid: 10.1   Review of Systems  Constitutional: Negative for malaise/fatigue.  Respiratory: Negative for cough and shortness of breath.   Cardiovascular: Negative for chest pain, palpitations and leg swelling.  Gastrointestinal: Negative for abdominal pain, constipation and heartburn.  Musculoskeletal: Positive for joint pain. Negative for back pain and myalgias.       Right great toe pain is improving   Skin: Negative.   Neurological: Negative for dizziness.  Psychiatric/Behavioral: The patient is not nervous/anxious.    Physical Exam Constitutional:      General: He is not in acute distress.    Appearance: He is well-developed. He is not diaphoretic.  Neck:     Musculoskeletal: Neck supple.     Thyroid: No thyromegaly.  Cardiovascular:     Rate and Rhythm: Normal rate and regular rhythm.     Pulses: Normal pulses.     Heart sounds: Normal heart sounds.  Pulmonary:     Effort: Pulmonary effort is normal. No respiratory distress.     Breath sounds: Normal breath sounds.     Comments: 02 dependent  Abdominal:     General: Bowel sounds are normal. There is no distension.     Palpations: Abdomen is soft.     Tenderness: There is no abdominal tenderness.  Musculoskeletal:     Right lower leg: No edema.     Left lower leg: No edema.     Comments: Is able to move all extremities Left arm in sling  Right great toe is less red hot and swollen.     Lymphadenopathy:     Cervical: No cervical adenopathy.  Skin:    General: Skin is warm and dry.  Neurological:     Mental Status: He is alert. Mental status is at baseline.  Psychiatric:        Mood and Affect: Mood normal.     ASSESSMENT/ PLAN:  TODAY:   1. SDH 2.  Gouty arthritis of right great toe 3.  Closed comminuted left humeral fracture  Will continue his current plan of care and his current medications The goal of his care at this time is for long term  SNF   MD is aware of resident's narcotic use and is in agreement with current plan of care. We will attempt to wean resident as apropriate   Ok Edwards NP [facility]  Contact [phone] Monday through Friday 8am- 5pm  After hours call [phone]

## 2018-06-04 ENCOUNTER — Encounter: Payer: Self-pay | Admitting: Adult Health

## 2018-06-04 ENCOUNTER — Non-Acute Institutional Stay (SKILLED_NURSING_FACILITY): Payer: Medicare Other | Admitting: Adult Health

## 2018-06-04 DIAGNOSIS — E785 Hyperlipidemia, unspecified: Secondary | ICD-10-CM

## 2018-06-04 DIAGNOSIS — I2510 Atherosclerotic heart disease of native coronary artery without angina pectoris: Secondary | ICD-10-CM

## 2018-06-04 DIAGNOSIS — I130 Hypertensive heart and chronic kidney disease with heart failure and stage 1 through stage 4 chronic kidney disease, or unspecified chronic kidney disease: Secondary | ICD-10-CM | POA: Diagnosis not present

## 2018-06-04 DIAGNOSIS — I5032 Chronic diastolic (congestive) heart failure: Secondary | ICD-10-CM | POA: Diagnosis not present

## 2018-06-04 DIAGNOSIS — N183 Chronic kidney disease, stage 3 unspecified: Secondary | ICD-10-CM

## 2018-06-04 DIAGNOSIS — E1169 Type 2 diabetes mellitus with other specified complication: Secondary | ICD-10-CM | POA: Diagnosis not present

## 2018-06-04 NOTE — Progress Notes (Signed)
Location:   [place] [facility] Room Number: [place] of Service:  SNF (31)   CODE STATUS: Full Code  Allergies  Allergen Reactions  . 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 Patient subsequently taken off amiodarone.  . Nifedipine Other (See Comments)    Reaction: made gums swell up. Pt states that he had to have gum surgery after taking.     Chief Complaint  Patient presents with  . Medical Management of Chronic Issues    Chronic diastolic CHF(congestive heart failure); coronary artery calcification as seen on CT scan; hypertensive heart and kidney disease with chronic diastolic heart failure and stage 3 chronic kidney disease; dyslipidemia; associated with type 2 diabetes mellitus. Weekly follow up for the first 30 days post hospitalization.     HPI:  He is a 85 year old short term rehab patient being seen for the management of his chronic illnesses: chf; cad; hypertensive heart disease; dyslipidemia. He states that his right great toe is feeling much better. He denies any changes in appetite; no anxiety no insomnia; no shortness of breath.   Past Medical History:  Diagnosis Date  . Bradycardia    a. during 02/2015 admission - occasional HR in 40s while being treated for atrial fib.  . Cervical disc disease   . Chronic diastolic CHF (congestive heart failure) ([place])    a. Dx 02/2015 -  2D echo 03/02/15 showed severe focal basal hypertrophy of the septum, EF 55-60%, mild MR, no effusion.  . CKD (chronic kidney disease), stage III ([place])   . Coronary artery calcification seen on CT scan    a. Nuc 02/2015 was normal.  . Dyslipidemia   . GERD (gastroesophageal reflux disease)   . Hypertension   . 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 PAF (paroxysmal atrial fibrillation) ([place])    a. Dx 02/2015 - in and out on  tele, rx'd Coumadin and amiodarone.  . Pneumonia 2007  . Shortness of breath dyspnea    WITH EXERTION  . Type II diabetes mellitus ([place])     Past Surgical History:  Procedure Laterality Date  . CATARACT EXTRACTION W/ INTRAOCULAR LENS  IMPLANT, BILATERAL Bilateral   . CERVICAL DISC SURGERY     "i've had 3 neck ORs; not sure what kind; all thru the back of my neck"  . CHOLECYSTECTOMY    . COLONOSCOPY WITH PROPOFOL N/A 01/02/2016   Procedure: COLONOSCOPY WITH PROPOFOL;  Surgeon: [name], MD;  Location: [facility];  Service: Endoscopy;  Laterality: N/A;  . EYE SURGERY  1930's   "don't know what for" (05/11/2013)  . JOINT REPLACEMENT     bilateral knees, elbows, and shoulders (on 05/11/2013 pt denies all joint replacements"   . KNEE ARTHROPLASTY     "had one scope; one open knee OR; not sure which on which side" (05/10/2013)  . KNEE ARTHROSCOPY     "had one scope; one open knee OR; not sure which on which side" (05/10/2013)  . POSTERIOR FUSION CERVICAL SPINE    . REVERSE SHOULDER ARTHROPLASTY Left 05/19/2018   Procedure: REVERSE SHOULDER ARTHROPLASTY;  Surgeon: [name], MD;  Location: [place];  Service: Orthopedics;  Laterality: Left;  . ROTATOR CUFF REPAIR Bilateral   . VIDEO BRONCHOSCOPY Bilateral 02/01/2016   Procedure: VIDEO BRONCHOSCOPY WITHOUT FLUORO;  Surgeon: Hart Robinsons  Mannam, MD;  Location: [facility];  Service: Cardiopulmonary;  Laterality: Bilateral;    Social History   Socioeconomic History  . Marital status: Married    Spouse name: Vaughan Basta  . Number of children: 0  . Years of education: Not on file  . Highest education level: Not on file  Occupational History  . Occupation: Retired from Architect work.  Social Needs  . Financial resource strain: Not on file  . Food insecurity:    Worry: Not on file    Inability: Not on file  . Transportation needs:    Medical: Not on file    Non-medical: Not on file  Tobacco Use  . Smoking status: Never  Smoker  . Smokeless tobacco: Former Systems developer    Types: Chew  Substance and Sexual Activity  . Alcohol use: No  . Drug use: No    Comment: used chew when I was a teenager per patient   . Sexual activity: Not on file  Lifestyle  . Physical activity:    Days per week: Not on file    Minutes per session: Not on file  . Stress: Not on file  Relationships  . Social connections:    Talks on phone: Not on file    Gets together: Not on file    Attends religious service: Not on file    Active member of club or organization: Not on file    Attends meetings of clubs or organizations: Not on file    Relationship status: Not on file  . Intimate partner violence:    Fear of current or ex partner: Not on file    Emotionally abused: Not on file    Physically abused: Not on file    Forced sexual activity: Not on file  Other Topics Concern  . Not on file  Social History Narrative   Married.  Lives with wife.  Ambulates independently.  Has a dog names is named Programmer, applications  .   Retired - worked for [employer] paper   No Sport and exercise psychologist Pulmonary:   No known bird, mold, or hot tub exposure.   Family History  Problem Relation Age of Onset  . Diabetes Mellitus II Paternal Grandmother   . Heart disease Neg Hx        He does not know his father's history.   . Cancer Neg Hx   . Diabetes Neg Hx   . CAD Neg Hx   . Lung disease Neg Hx   . Rheumatologic disease Neg Hx       VITAL SIGNS BP (!) 162/45   Pulse 75   Temp 98.7 F (37.1 C)   Resp 18   Ht 5' 7.99" (1.727 m)   Wt 195 lb 12.8 oz (88.8 kg)   SpO2 98%   BMI 29.78 kg/m   Outpatient Encounter Medications as of 06/04/2018  Medication Sig Note  . albuterol (PROVENTIL) (2.5 MG/3ML) 0.083% nebulizer solution Take 3 mLs (2.5 mg total) by nebulization every 4 (four) hours as needed for wheezing or shortness of breath.   Marland Kitchen atorvastatin (LIPITOR) 40 MG tablet Take 40 mg by mouth every evening.    . bisacodyl (DULCOLAX) 10 MG  suppository Place 1 suppository (10 mg total) rectally daily as needed for moderate constipation.   . Cholecalciferol (VITAMIN D-3) 25 MCG (1000 UT) CAPS Take 1 capsule (1,000 Units total) by mouth daily.   . colchicine 0.6 MG tablet Take 0.6 mg by mouth  2 (two) times daily.   Marland Kitchen docusate sodium (COLACE) 100 MG capsule Take 1 capsule (100 mg total) by mouth 2 (two) times daily.   . febuxostat (ULORIC) 40 MG tablet Take 40 mg by mouth daily.   . furosemide (LASIX) 40 MG tablet Take 1 tablet (40 mg total) by mouth daily.   . hydrALAZINE (APRESOLINE) 25 MG tablet Take 1 tablet (25 mg total) by mouth every 8 (eight) hours.   . hydrocortisone cream 1 % Apply topically 2 (two) times daily. Apply to itchy areas on the left arm and chest   . insulin aspart (NOVOLOG) 100 UNIT/ML injection Inject 0-9 Units into the skin 3 (three) times daily with meals. Sliding scale CBG 70 - 120: 0 units CBG 121 - 150: 1 unit,  CBG 151 - 200: 2 units,  CBG 201 - 250: 3 units,  CBG 251 - 300: 5 units,  CBG 301 - 350: 7 units,  CBG 351 - 400: 9 units   CBG > 400: 9 units and notify your MD   . insulin detemir (LEVEMIR) 100 UNIT/ML injection Inject 0.26 mLs (26 Units total) into the skin at bedtime.   . isosorbide mononitrate (IMDUR) 60 MG 24 hr tablet Take 1 tablet (60 mg total) by mouth daily.   Marland Kitchen LORazepam (ATIVAN) 0.5 MG tablet Take 1 tablet (0.5 mg total) by mouth every 8 (eight) hours as needed for anxiety.   . metoprolol (LOPRESSOR) 50 MG tablet Take 50 mg by mouth 2 (two) times daily.   . NON FORMULARY Diet Type:  NAS, NCS   . omeprazole (PRILOSEC) 20 MG capsule Take 20 mg by mouth daily.  11/22/2014: .  . oxyCODONE-acetaminophen (PERCOCET/ROXICET) 5-325 MG tablet Take 1 tablet by mouth every 6 (six) hours as needed for moderate pain or severe pain.   . OXYGEN Inhale 3 L/min into the lungs continuous.   . polyethylene glycol (MIRALAX / GLYCOLAX) packet Take 17 g by mouth daily as needed for mild constipation.   . sodium  chloride (OCEAN) 0.65 % SOLN nasal spray Place 1 spray into both nostrils as needed for congestion.   . vitamin B-12 (CYANOCOBALAMIN) 1000 MCG tablet Take 1 tablet (1,000 mcg total) by mouth daily.    No facility-administered encounter medications on file as of 06/04/2018.      SIGNIFICANT DIAGNOSTIC EXAMS   PREVIOUS:   05-13-18: ct of head:  1. Acute, left greater than right subdural hematomas measuring up to10 mm in thickness on the left overlying the left frontal, temporaland parietal lobes and up to 3 mm overlying the right temporal lobe. No midline shift  05-13-18: ct of left shoulder:  Neer category 2 part fracture of the proximal left humerus with one shaft width anterior displacement of the humeral shaft relative to the humeral head. Nondisplaced fracture undermining the greater tuberosity is identified. No joint dislocation is seen.  05-14-18: ct of head:  1. Decreased size of bilateral acute subdural hematomas measuring to 6 mm on the LEFT. No midline shift. 2. Image findings of chronic communicating hydrocephalus.  05-19-18: left shoulder x-ray: Interval left shoulder replacement for proximal humerus fracture with expected postsurgical changes  05-23-18: chest x-ray: 1. Diminished aeration to left base compared with prior exam. 2. Suspect small right pleural effusion.  05-25-18: 2-d echo: - Left ventricle: The cavity size was normal. Systolic function was vigorous. The estimated ejection fraction was in the range of 65% to 70%. Wall motion was normal; there were no regional wall  motion abnormalities. Doppler parameters are consistent with abnormal left ventricular relaxation (grade 1 diastolic dysfunction). - Aortic valve: Trileaflet; mildly thickened, mildly calcified leaflets.  NO NEW EXAMS.   LABS REVIEWED: PREVIOUS;   05-13-18: wbc 19.0; hgb 14.6; hct 46.5; mcv 96.1 plt 243 glucose 186; bun 31; creat 1.66; k+ 5.5; na++ 140; ca 9.6 liver normal albumin 3.4 blood  culture: no growth  05-15-18; wbc 15.5; hgb 11.3; hct 36.4; mcv 97.6 plt 199 glucose 194; bun 50; creat 2.90; k+ 4.9; na++ 138  05-18-18: glucose 115; bun 33; creat 1.44; k+ 4.3; na++ 136; ca 8.2  05-27-18: wbc 13.1; hgb 10.5; hct 33.3; mcv 93.;8 plt 327;glucose 86; bun 39; creat 1.93; k+ 3.8; na++ 136; ca 8.8  06-02-18: uric acid: 10.1  NO NEW LABS.    Review of Systems  Constitutional: Negative for malaise/fatigue.  Respiratory: Negative for cough and shortness of breath.   Cardiovascular: Negative for chest pain, palpitations and leg swelling.  Gastrointestinal: Negative for abdominal pain, constipation and heartburn.  Musculoskeletal: Negative for back pain, joint pain and myalgias.       No right great toe pain   Skin: Negative.   Neurological: Negative for dizziness.  Psychiatric/Behavioral: The patient is not nervous/anxious.      Physical Exam Constitutional:      General: He is not in acute distress.    Appearance: He is well-developed. He is not diaphoretic.  Neck:     Musculoskeletal: Neck supple.     Thyroid: No thyromegaly.  Cardiovascular:     Rate and Rhythm: Normal rate and regular rhythm.     Pulses: Normal pulses.     Heart sounds: Normal heart sounds.  Pulmonary:     Effort: Pulmonary effort is normal. No respiratory distress.     Breath sounds: Normal breath sounds.     Comments: 02 dependent  Abdominal:     General: Bowel sounds are normal. There is no distension.     Palpations: Abdomen is soft.     Tenderness: There is no abdominal tenderness.  Musculoskeletal:     Right lower leg: No edema.     Left lower leg: No edema.     Comments:  Is able to move all extremities Left arm in sling   Lymphadenopathy:     Cervical: No cervical adenopathy.  Skin:    General: Skin is warm and dry.  Neurological:     Mental Status: He is alert. Mental status is at baseline.  Psychiatric:        Mood and Affect: Mood normal.        ASSESSMENT/  PLAN:  TODAY:   1. Chronic diastolic CHF (congestive heart failure): is stable EF 65-70% (05-25-18): will continue lasix 40 mg daily and lopressor 50 mg twice daily and imdur 60 mg daily   2. Coronary artery calcification seen on CT scan: is stable will continue imdur 60 mg daily   3. Hypertensive heart and kidney disease with chronic diastolic congestive heart failure nad stage 3 chronic kidney disease: is stable b/p 162/45: will continue apresoline 35 mg every 8 hours; imdur 60 mg daily lopressor 50 mg twice daily   4. Dyslipidemia associated with type 2 diabetes mellitus: is stable will continue lipitor 40 mg daily   PREVIOUS   5. Pulmonary fibrosis: is stable is 02 dependent at 3L/min at this time was not using prior to hospitalization. Will continue albuterol neb treatment four times daily as needed;   6. GERD without  esophagitis: is stable will continue prilosec 20 mg daily   7. Chronic constipation: is stable will continue colace twice daily and miralax daily as needed   8.  PAF( paroxysmal atrial fibrillation): heart rate stable will continue lopressor 50 mg twice daily for rate control; is not on anticoagulation due to subdural hematoma  9. Type 2 diabetes mellitus with stage 3 chronic kidney disease with long term current use of insulin: is stable will continue levemir 26 units nightly novolog SSI: 151-200: 2 units; 201-250: 3 units; 251-300: 5 units; 301-350: 7 units; 351-400: 9 units;   10. SDH: (subdural hematoma): is neurologically stable; will continue to monitor and will follow up with neurosurgery as indicated.   11. CKD stage 3 due to type 2 diabetes mellitus: is stable bun 39; creat 1.93  12. Closed displaced comminuted fracture of left of left humerus sequela: is status post reverse shoulder arthroplasty on 05-19-18. Will continue therapy as directed and will follow up with orthopedics as directed. Will continue percocet 5/325 mg every 6 hours as needed   13. Acute  gout right great toe: is improving: will complete colchicine 0.6 mg twice daily and will continue ulrolic 40 mg daily     MD is aware of resident's narcotic use and is in agreement with current plan of care. We will attempt to wean resident as apropriate   Ok Edwards NP [facility]  Contact [phone] Monday through Friday 8am- 5pm  After hours call [phone]

## 2018-06-05 DIAGNOSIS — M109 Gout, unspecified: Secondary | ICD-10-CM | POA: Insufficient documentation

## 2018-06-09 ENCOUNTER — Ambulatory Visit: Payer: BLUE CROSS/BLUE SHIELD | Admitting: Pulmonary Disease

## 2018-06-11 ENCOUNTER — Non-Acute Institutional Stay (SKILLED_NURSING_FACILITY): Payer: Medicare Other | Admitting: Adult Health

## 2018-06-11 ENCOUNTER — Encounter: Payer: Self-pay | Admitting: Adult Health

## 2018-06-11 DIAGNOSIS — S42352S Displaced comminuted fracture of shaft of humerus, left arm, sequela: Secondary | ICD-10-CM

## 2018-06-11 DIAGNOSIS — I5032 Chronic diastolic (congestive) heart failure: Secondary | ICD-10-CM | POA: Diagnosis not present

## 2018-06-11 DIAGNOSIS — I2510 Atherosclerotic heart disease of native coronary artery without angina pectoris: Secondary | ICD-10-CM | POA: Diagnosis not present

## 2018-06-11 DIAGNOSIS — N183 Chronic kidney disease, stage 3 unspecified: Secondary | ICD-10-CM

## 2018-06-11 DIAGNOSIS — I130 Hypertensive heart and chronic kidney disease with heart failure and stage 1 through stage 4 chronic kidney disease, or unspecified chronic kidney disease: Secondary | ICD-10-CM | POA: Diagnosis not present

## 2018-06-11 NOTE — Progress Notes (Signed)
Location:    [facility] Room Number: 2 Place of Service:  SNF (31)   CODE STATUS: full code   Allergies  Allergen Reactions  . 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 Patient subsequently taken off amiodarone.  . Nifedipine Other (See Comments)    Reaction: made gums swell up. Pt states that he had to have gum surgery after taking.     Chief Complaint  Patient presents with  . Medical Management of Chronic Issues    Closed displaced comminuted fracture of shaft of left humerus sequela; hypertensive heart and kidney disease with chronic diastolic congestive heart failure and stage 3 chronic kidney disease; coronary artery calcification seen on CT scan; chronic diastolic CHF. Weekly follow up for the first 30 days post hospitalization.     HPI:  He is a 85 year old short term rehab patient being seen for the management of his chronic illnesses: left humerus fracture; hypertensive heart disease; cad; chf. He does have left humeral pain; but has not taken his percocet since 06-08-18; will stop this medication. His right great toe does not hurt. He denies any changes in appetite; no shortness of breath.   Past Medical History:  Diagnosis Date  . Bradycardia    a. during 02/2015 admission - occasional HR in 40s while being treated for atrial fib.  . Cervical disc disease   . Chronic diastolic CHF (congestive heart failure) ([place])    a. Dx 02/2015 -  2D echo 03/02/15 showed severe focal basal hypertrophy of the septum, EF 55-60%, mild MR, no effusion.  . CKD (chronic kidney disease), stage III ([place])   . Coronary artery calcification seen on CT scan    a. Nuc 02/2015 was normal.  . Dyslipidemia   . GERD (gastroesophageal reflux disease)   . Hypertension   . 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 PAF (paroxysmal atrial  fibrillation) ([place])    a. Dx 02/2015 - in and out on tele, rx'd Coumadin and amiodarone.  . Pneumonia 2007  . Shortness of breath dyspnea    WITH EXERTION  . Type II diabetes mellitus ([place])     Past Surgical History:  Procedure Laterality Date  . CATARACT EXTRACTION W/ INTRAOCULAR LENS  IMPLANT, BILATERAL Bilateral   . CERVICAL DISC SURGERY     "i've had 3 neck ORs; not sure what kind; all thru the back of my neck"  . CHOLECYSTECTOMY    . COLONOSCOPY WITH PROPOFOL N/A 01/02/2016   Procedure: COLONOSCOPY WITH PROPOFOL;  Surgeon: [name], MD;  Location: [facility];  Service: Endoscopy;  Laterality: N/A;  . EYE SURGERY  1930's   "don't know what for" (05/11/2013)  . JOINT REPLACEMENT     bilateral knees, elbows, and shoulders (on 05/11/2013 pt denies all joint replacements"   . KNEE ARTHROPLASTY     "had one scope; one open knee OR; not sure which on which side" (05/10/2013)  . KNEE ARTHROSCOPY     "had one scope; one open knee OR; not sure which on which side" (05/10/2013)  . POSTERIOR FUSION CERVICAL SPINE    . REVERSE SHOULDER ARTHROPLASTY Left 05/19/2018   Procedure: REVERSE SHOULDER ARTHROPLASTY;  Surgeon: [name], MD;  Location: [facility];  Service: Orthopedics;  Laterality: Left;  . ROTATOR CUFF REPAIR Bilateral   . VIDEO BRONCHOSCOPY  Bilateral 02/01/2016   Procedure: VIDEO BRONCHOSCOPY WITHOUT FLUORO;  Surgeon: Marshell [name], MD;  Location: [facility];  Service: Cardiopulmonary;  Laterality: Bilateral;    Social History   Socioeconomic History  . Marital status: Married    Spouse name: Vaughan Basta  . Number of children: 0  . Years of education: Not on file  . Highest education level: Not on file  Occupational History  . Occupation: Retired from Architect work.  Social Needs  . Financial resource strain: Not on file  . Food insecurity:    Worry: Not on file    Inability: Not on file  . Transportation needs:    Medical: Not on file     Non-medical: Not on file  Tobacco Use  . Smoking status: Never Smoker  . Smokeless tobacco: Former Systems developer    Types: Chew  Substance and Sexual Activity  . Alcohol use: No  . Drug use: No    Comment: used chew when I was a teenager per patient   . Sexual activity: Not on file  Lifestyle  . Physical activity:    Days per week: Not on file    Minutes per session: Not on file  . Stress: Not on file  Relationships  . Social connections:    Talks on phone: Not on file    Gets together: Not on file    Attends religious service: Not on file    Active member of club or organization: Not on file    Attends meetings of clubs or organizations: Not on file    Relationship status: Not on file  . Intimate partner violence:    Fear of current or ex partner: Not on file    Emotionally abused: Not on file    Physically abused: Not on file    Forced sexual activity: Not on file  Other Topics Concern  . Not on file  Social History Narrative   Married.  Lives with wife.  Ambulates independently.  Has a dog names is named Programmer, applications  .   Retired - worked for [employer] paper   No Sport and exercise psychologist Pulmonary:   No known bird, mold, or hot tub exposure.   Family History  Problem Relation Age of Onset  . Diabetes Mellitus II Paternal Grandmother   . Heart disease Neg Hx        He does not know his father's history.   . Cancer Neg Hx   . Diabetes Neg Hx   . CAD Neg Hx   . Lung disease Neg Hx   . Rheumatologic disease Neg Hx       VITAL SIGNS BP (!) 148/48   Pulse 76   Temp 98.1 F (36.7 C)   Resp 18   Ht 5\' 7"  (1.702 m)   Wt 195 lb 6.4 oz (88.6 kg)   SpO2 100%   BMI 30.60 kg/m   Outpatient Encounter Medications as of 06/11/2018  Medication Sig Note  . albuterol (PROVENTIL) (2.5 MG/3ML) 0.083% nebulizer solution Take 3 mLs (2.5 mg total) by nebulization every 4 (four) hours as needed for wheezing or shortness of breath.   Marland Kitchen atorvastatin (LIPITOR) 40 MG tablet Take 40  mg by mouth every evening.    . bisacodyl (DULCOLAX) 10 MG suppository Place 1 suppository (10 mg total) rectally daily as needed for moderate constipation.   . Cholecalciferol (VITAMIN D-3) 25 MCG (1000 UT) CAPS Take 1 capsule (1,000 Units total) by mouth daily.   Marland Kitchen  docusate sodium (COLACE) 100 MG capsule Take 1 capsule (100 mg total) by mouth 2 (two) times daily.   . febuxostat (ULORIC) 40 MG tablet Take 40 mg by mouth daily.   . furosemide (LASIX) 40 MG tablet Take 1 tablet (40 mg total) by mouth daily.   . hydrALAZINE (APRESOLINE) 25 MG tablet Take 1 tablet (25 mg total) by mouth every 8 (eight) hours.   . hydrocortisone cream 1 % Apply topically 2 (two) times daily. Apply to itchy areas on the left arm and chest   . insulin aspart (NOVOLOG) 100 UNIT/ML injection Inject 0-9 Units into the skin 3 (three) times daily with meals. Sliding scale CBG 70 - 120: 0 units CBG 121 - 150: 1 unit,  CBG 151 - 200: 2 units,  CBG 201 - 250: 3 units,  CBG 251 - 300: 5 units,  CBG 301 - 350: 7 units,  CBG 351 - 400: 9 units   CBG > 400: 9 units and notify your MD   . insulin detemir (LEVEMIR) 100 UNIT/ML injection Inject 0.26 mLs (26 Units total) into the skin at bedtime.   . isosorbide mononitrate (IMDUR) 60 MG 24 hr tablet Take 1 tablet (60 mg total) by mouth daily.   . metoprolol (LOPRESSOR) 50 MG tablet Take 50 mg by mouth 2 (two) times daily.   . NON FORMULARY Diet Type:  NAS, NCS   . omeprazole (PRILOSEC) 20 MG capsule Take 20 mg by mouth daily.  11/22/2014: .  . oxyCODONE-acetaminophen (PERCOCET/ROXICET) 5-325 MG tablet Take 1 tablet by mouth every 6 (six) hours as needed for moderate pain or severe pain.   . OXYGEN Inhale 3 L/min into the lungs continuous.   . polyethylene glycol (MIRALAX / GLYCOLAX) packet Take 17 g by mouth daily as needed for mild constipation.   . sodium chloride (OCEAN) 0.65 % SOLN nasal spray Place 1 spray into both nostrils as needed for congestion.   . vitamin B-12 (CYANOCOBALAMIN)  1000 MCG tablet Take 1 tablet (1,000 mcg total) by mouth daily.   . [DISCONTINUED] colchicine 0.6 MG tablet Take 0.6 mg by mouth 2 (two) times daily.   . [DISCONTINUED] LORazepam (ATIVAN) 0.5 MG tablet Take 1 tablet (0.5 mg total) by mouth every 8 (eight) hours as needed for anxiety.    No facility-administered encounter medications on file as of 06/11/2018.      SIGNIFICANT DIAGNOSTIC EXAMS   PREVIOUS:   05-13-18: ct of head:  1. Acute, left greater than right subdural hematomas measuring up to10 mm in thickness on the left overlying the left frontal, temporaland parietal lobes and up to 3 mm overlying the right temporal lobe. No midline shift  05-13-18: ct of left shoulder:  Neer category 2 part fracture of the proximal left humerus with one shaft width anterior displacement of the humeral shaft relative to the humeral head. Nondisplaced fracture undermining the greater tuberosity is identified. No joint dislocation is seen.  05-14-18: ct of head:  1. Decreased size of bilateral acute subdural hematomas measuring to 6 mm on the LEFT. No midline shift. 2. Image findings of chronic communicating hydrocephalus.  05-19-18: left shoulder x-ray: Interval left shoulder replacement for proximal humerus fracture with expected postsurgical changes  05-23-18: chest x-ray: 1. Diminished aeration to left base compared with prior exam. 2. Suspect small right pleural effusion.  05-25-18: 2-d echo: - Left ventricle: The cavity size was normal. Systolic function was vigorous. The estimated ejection fraction was in the range of 65%  to 70%. Wall motion was normal; there were no regional wall motion abnormalities. Doppler parameters are consistent with abnormal left ventricular relaxation (grade 1 diastolic dysfunction). - Aortic valve: Trileaflet; mildly thickened, mildly calcified leaflets.  NO NEW EXAMS.   LABS REVIEWED: PREVIOUS;   05-13-18: wbc 19.0; hgb 14.6; hct 46.5; mcv 96.1 plt 243 glucose  186; bun 31; creat 1.66; k+ 5.5; na++ 140; ca 9.6 liver normal albumin 3.4 blood culture: no growth  05-15-18; wbc 15.5; hgb 11.3; hct 36.4; mcv 97.6 plt 199 glucose 194; bun 50; creat 2.90; k+ 4.9; na++ 138  05-18-18: glucose 115; bun 33; creat 1.44; k+ 4.3; na++ 136; ca 8.2  05-27-18: wbc 13.1; hgb 10.5; hct 33.3; mcv 93.;8 plt 327;glucose 86; bun 39; creat 1.93; k+ 3.8; na++ 136; ca 8.8  06-02-18: uric acid: 10.1  NO NEW LABS.    Review of Systems  Constitutional: Negative for malaise/fatigue.  Respiratory: Negative for cough and shortness of breath.   Cardiovascular: Negative for chest pain, palpitations and leg swelling.  Gastrointestinal: Negative for abdominal pain, constipation and heartburn.  Musculoskeletal: Negative for back pain, joint pain and myalgias.  Skin: Negative.   Neurological: Negative for dizziness.  Psychiatric/Behavioral: The patient is not nervous/anxious.     Physical Exam Constitutional:      General: He is not in acute distress.    Appearance: He is well-developed. He is obese. He is not diaphoretic.  Neck:     Thyroid: No thyromegaly.  Cardiovascular:     Rate and Rhythm: Normal rate and regular rhythm.     Pulses: Normal pulses.     Heart sounds: Normal heart sounds.  Pulmonary:     Effort: Pulmonary effort is normal. No respiratory distress.     Breath sounds: Normal breath sounds.     Comments: 02 dependent  Abdominal:     General: Bowel sounds are normal. There is no distension.     Palpations: Abdomen is soft.     Tenderness: There is no abdominal tenderness.  Musculoskeletal:     Right lower leg: No edema.     Left lower leg: No edema.     Comments: Is able to move all extremities Left arm in sling    Lymphadenopathy:     Cervical: No cervical adenopathy.  Skin:    General: Skin is warm and dry.  Neurological:     Mental Status: He is alert and oriented to person, place, and time.  Psychiatric:        Mood and Affect: Mood normal.       ASSESSMENT/ PLAN:  TODAY:   1. Closed displaced comminuted fracture of left of left humerus sequela: is status post reverse shoulder arthroplasty on 05-19-18. Will continue therapy as directed and will follow up with orthopedics as directed. Will will stop percocet he has not required this medication since 06-08-18  Will continue tylenol 650 mg every 6 hours as needed   2. Chronic diastolic CHF (congestive heart failure): is stable EF 65-70% (05-25-18): will continue lasix 40 mg daily and lopressor 50 mg twice daily and imdur 60 mg daily   3. Coronary artery calcification seen on CT scan: is stable will continue imdur 60 mg daily   4. Hypertensive heart and kidney disease with chronic diastolic congestive heart failure nad stage 3 chronic kidney disease: is stable b/p 148/84 will continue apresoline 25 mg every 8 hours; imdur 60 mg daily lopressor 50 mg twice daily    PREVIOUS  5.  Dyslipidemia associated with type 2 diabetes mellitus: is stable will continue lipitor 40 mg daily   6. Pulmonary fibrosis: is stable is 02 dependent at 3L/min at this time was not using prior to hospitalization. Will continue albuterol neb treatment four times daily as needed;   7. GERD without esophagitis: is stable will continue prilosec 20 mg daily   8. Chronic constipation: is stable will continue colace twice daily and miralax daily as needed   9.  PAF( paroxysmal atrial fibrillation): heart rate stable will continue lopressor 50 mg twice daily for rate control; is not on anticoagulation due to subdural hematoma  10. Type 2 diabetes mellitus with stage 3 chronic kidney disease with long term current use of insulin: is stable will continue levemir 26 units nightly novolog SSI: 151-200: 2 units; 201-250: 3 units; 251-300: 5 units; 301-350: 7 units; 351-400: 9 units;   11. SDH: (subdural hematoma): is neurologically stable; will continue to monitor and will follow up with neurosurgery as indicated.    12. CKD stage 3 due to type 2 diabetes mellitus: is stable bun 39; creat 1.93  13. Chronic gout right great toe: is stable will continue uloric 40 mg daily     MD is aware of resident's narcotic use and is in agreement with current plan of care. We will attempt to wean resident as apropriate   Ok Edwards NP [facility]  Contact [phone] Monday through Friday 8am- 5pm  After hours call [phone]

## 2018-06-15 ENCOUNTER — Ambulatory Visit: Payer: Medicare Other | Admitting: Pulmonary Disease

## 2018-06-15 ENCOUNTER — Encounter: Payer: Self-pay | Admitting: Pulmonary Disease

## 2018-06-15 DIAGNOSIS — Z9181 History of falling: Secondary | ICD-10-CM | POA: Diagnosis not present

## 2018-06-15 DIAGNOSIS — M10371 Gout due to renal impairment, right ankle and foot: Secondary | ICD-10-CM | POA: Diagnosis not present

## 2018-06-15 DIAGNOSIS — Z96612 Presence of left artificial shoulder joint: Secondary | ICD-10-CM | POA: Diagnosis not present

## 2018-06-15 DIAGNOSIS — E785 Hyperlipidemia, unspecified: Secondary | ICD-10-CM | POA: Diagnosis not present

## 2018-06-15 DIAGNOSIS — I5032 Chronic diastolic (congestive) heart failure: Secondary | ICD-10-CM | POA: Diagnosis not present

## 2018-06-15 DIAGNOSIS — M6281 Muscle weakness (generalized): Secondary | ICD-10-CM | POA: Diagnosis not present

## 2018-06-15 DIAGNOSIS — E1122 Type 2 diabetes mellitus with diabetic chronic kidney disease: Secondary | ICD-10-CM | POA: Diagnosis not present

## 2018-06-15 DIAGNOSIS — K219 Gastro-esophageal reflux disease without esophagitis: Secondary | ICD-10-CM | POA: Diagnosis not present

## 2018-06-15 DIAGNOSIS — R41841 Cognitive communication deficit: Secondary | ICD-10-CM | POA: Diagnosis not present

## 2018-06-15 DIAGNOSIS — N183 Chronic kidney disease, stage 3 (moderate): Secondary | ICD-10-CM | POA: Diagnosis not present

## 2018-06-15 DIAGNOSIS — M5090 Cervical disc disorder, unspecified, unspecified cervical region: Secondary | ICD-10-CM | POA: Diagnosis not present

## 2018-06-15 DIAGNOSIS — I480 Paroxysmal atrial fibrillation: Secondary | ICD-10-CM | POA: Diagnosis not present

## 2018-06-15 DIAGNOSIS — I130 Hypertensive heart and chronic kidney disease with heart failure and stage 1 through stage 4 chronic kidney disease, or unspecified chronic kidney disease: Secondary | ICD-10-CM | POA: Diagnosis not present

## 2018-06-15 DIAGNOSIS — J849 Interstitial pulmonary disease, unspecified: Secondary | ICD-10-CM

## 2018-06-15 DIAGNOSIS — S065X0D Traumatic subdural hemorrhage without loss of consciousness, subsequent encounter: Secondary | ICD-10-CM | POA: Diagnosis not present

## 2018-06-15 DIAGNOSIS — R279 Unspecified lack of coordination: Secondary | ICD-10-CM | POA: Diagnosis not present

## 2018-06-15 NOTE — Progress Notes (Signed)
Justin Leach    [id number]    11/07/1931  Primary Care Physician:Griffin, Jenny Reichmann, MD  Referring Physician: Lavone Orn, MD [facility] [address], [place] [address]  Chief complaint:  Follow up for IPAF, interstitial pneumonia with autoimmune features. Positive CCP Severe restrictive lung disease Dyspnea on exertion 6 mm endobronchial nodule, 7 mm pulmonary nodule   HPI: Justin Leach is a 85 year old with past medical history atrial fibrillation, diastolic heart failure, acute on chronic renal failure.   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  Also noted to have pulmonary and endobronchial nodule. He underwent bronchoscopy on 02/01/2016 with bronchial brushings that showed benign changes.  Follow-up CT recommended but did not follow through.  Interim history: Justin Leach is here after interval of 2 and half years for reevaluation of his respiratory status  He had an admission in Dec 2019 after a fall with facial laceration, left humeral fracture and subdural hematoma.  Hospitalization complicated with acute delirium with sundowning, massive volume overload for which he underwent diuresis. Neuro surgery was consulted for subdural hematoma and advised conservative management He underwent shoulder arthroplasty by orthopedics on 05/19/18  He is currently in a rehab facility  Outpatient Encounter Medications as of 06/15/2018  Medication Sig  . albuterol (PROVENTIL) (2.5 MG/3ML) 0.083% nebulizer solution Take 3 mLs (2.5 mg total) by nebulization every 4 (four) hours as needed for wheezing or shortness of breath.  Marland Kitchen atorvastatin (LIPITOR) 40 MG tablet Take 40 mg by mouth every evening.   . bisacodyl (DULCOLAX) 10 MG suppository Place 1 suppository (10 mg total) rectally daily as needed for moderate constipation.  . Cholecalciferol (VITAMIN D-3) 25 MCG  (1000 UT) CAPS Take 1 capsule (1,000 Units total) by mouth daily.  Marland Kitchen docusate sodium (COLACE) 100 MG capsule Take 1 capsule (100 mg total) by mouth 2 (two) times daily.  . febuxostat (ULORIC) 40 MG tablet Take 40 mg by mouth daily.  . furosemide (LASIX) 40 MG tablet Take 1 tablet (40 mg total) by mouth daily.  . hydrALAZINE (APRESOLINE) 25 MG tablet Take 1 tablet (25 mg total) by mouth every 8 (eight) hours.  . hydrocortisone cream 1 % Apply topically 2 (two) times daily. Apply to itchy areas on the left arm and chest  . insulin aspart (NOVOLOG) 100 UNIT/ML injection Inject 0-9 Units into the skin 3 (three) times daily with meals. Sliding scale CBG 70 - 120: 0 units CBG 121 - 150: 1 unit,  CBG 151 - 200: 2 units,  CBG 201 - 250: 3 units,  CBG 251 - 300: 5 units,  CBG 301 - 350: 7 units,  CBG 351 - 400: 9 units   CBG > 400: 9 units and notify your MD  . insulin detemir (LEVEMIR) 100 UNIT/ML injection Inject 0.26 mLs (26 Units total) into the skin at bedtime.  . isosorbide mononitrate (IMDUR) 60 MG 24 hr tablet Take 1 tablet (60 mg total) by mouth daily.  . metoprolol (LOPRESSOR) 50 MG tablet Take 50 mg by mouth 2 (two) times daily.  . NON FORMULARY Diet Type:  NAS, NCS  . omeprazole (PRILOSEC) 20 MG capsule Take 20 mg by mouth daily.   Marland Kitchen oxyCODONE-acetaminophen (PERCOCET/ROXICET) 5-325 MG tablet Take 1 tablet by mouth every 6 (six) hours as needed for moderate pain or severe pain.  . OXYGEN Inhale 3  L/min into the lungs continuous.  . polyethylene glycol (MIRALAX / GLYCOLAX) packet Take 17 g by mouth daily as needed for mild constipation.  . sodium chloride (OCEAN) 0.65 % SOLN nasal spray Place 1 spray into both nostrils as needed for congestion.  . vitamin B-12 (CYANOCOBALAMIN) 1000 MCG tablet Take 1 tablet (1,000 mcg total) by mouth daily.   No facility-administered encounter medications on file as of 06/15/2018.     Allergies as of 06/15/2018 - Review Complete 06/15/2018  Allergen Reaction  Noted  . Amiodarone Other (See Comments) 10/31/2015  . Nifedipine Other (See Comments) 05/11/2013    Past Medical History:  Diagnosis Date  . Bradycardia    a. during 02/2015 admission - occasional HR in 40s while being treated for atrial fib.  . Cervical disc disease   . Chronic diastolic CHF (congestive heart failure) ([place])    a. Dx 02/2015 -  2D echo 03/02/15 showed severe focal basal hypertrophy of the septum, EF 55-60%, mild MR, no effusion.  . CKD (chronic kidney disease), stage III ([place])   . Coronary artery calcification seen on CT scan    a. Nuc 02/2015 was normal.  . Dyslipidemia   . GERD (gastroesophageal reflux disease)   . Hypertension   . ILD (interstitial lung disease) ([place])    NSIP vs Amiodarone Induced Lung Injury  . PAF (paroxysmal atrial fibrillation) ([place])    a. Dx 02/2015 - in and out on tele, rx'd Coumadin and amiodarone.  . Pneumonia 2007  . Shortness of breath dyspnea    WITH EXERTION  . Type II diabetes mellitus ([place])     Past Surgical History:  Procedure Laterality Date  . CATARACT EXTRACTION W/ INTRAOCULAR LENS  IMPLANT, BILATERAL Bilateral   . CERVICAL DISC SURGERY     "i've had 3 neck ORs; not sure what kind; all thru the back of my neck"  . CHOLECYSTECTOMY    . COLONOSCOPY WITH PROPOFOL N/A 01/02/2016   Procedure: COLONOSCOPY WITH PROPOFOL;  Surgeon: [name], MD;  Location: [facility];  Service: Endoscopy;  Laterality: N/A;  . EYE SURGERY  1930's   "don't know what for" (05/11/2013)  . JOINT REPLACEMENT     bilateral knees, elbows, and shoulders (on 05/11/2013 pt denies all joint replacements"   . KNEE ARTHROPLASTY     "had one scope; one open knee OR; not sure which on which side" (05/10/2013)  . KNEE ARTHROSCOPY     "had one scope; one open knee OR; not sure which on which side" (05/10/2013)  . POSTERIOR FUSION CERVICAL SPINE    . REVERSE SHOULDER ARTHROPLASTY Left 05/19/2018   Procedure: REVERSE SHOULDER ARTHROPLASTY;  Surgeon:  [name], MD;  Location: [facility];  Service: Orthopedics;  Laterality: Left;  . ROTATOR CUFF REPAIR Bilateral   . VIDEO BRONCHOSCOPY Bilateral 02/01/2016   Procedure: VIDEO BRONCHOSCOPY WITHOUT FLUORO;  Surgeon: Marshell [name], MD;  Location: [facility];  Service: Cardiopulmonary;  Laterality: Bilateral;    Family History  Problem Relation Age of Onset  . Diabetes Mellitus II Paternal Grandmother   . Heart disease Neg Hx        He does not know his father's history.   . Cancer Neg Hx   . Diabetes Neg Hx   . CAD Neg Hx   . Lung disease Neg Hx   . Rheumatologic disease Neg Hx     Social History   Socioeconomic History  . Marital status: Married    Spouse name:  Linda  . Number of children: 0  . Years of education: Not on file  . Highest education level: Not on file  Occupational History  . Occupation: Retired from Architect work.  Social Needs  . Financial resource strain: Not on file  . Food insecurity:    Worry: Not on file    Inability: Not on file  . Transportation needs:    Medical: Not on file    Non-medical: Not on file  Tobacco Use  . Smoking status: Never Smoker  . Smokeless tobacco: Former Systems developer    Types: Chew  Substance and Sexual Activity  . Alcohol use: No  . Drug use: No    Comment: used chew when I was a teenager per patient   . Sexual activity: Not on file  Lifestyle  . Physical activity:    Days per week: Not on file    Minutes per session: Not on file  . Stress: Not on file  Relationships  . Social connections:    Talks on phone: Not on file    Gets together: Not on file    Attends religious service: Not on file    Active member of club or organization: Not on file    Attends meetings of clubs or organizations: Not on file    Relationship status: Not on file  . Intimate partner violence:    Fear of current or ex partner: Not on file    Emotionally abused: Not on file    Physically abused: Not on file    Forced sexual activity:  Not on file  Other Topics Concern  . Not on file  Social History Narrative   Married.  Lives with wife.  Ambulates independently.  Has a dog names is named Programmer, applications  .   Retired - worked for [employer] paper   No Sport and exercise psychologist Pulmonary:   No known bird, mold, or hot tub exposure.     Review of systems: Review of Systems  Constitutional: Negative for fever and chills.  HENT: Negative.   Eyes: Negative for blurred vision.  Respiratory: as per HPI  Cardiovascular: Negative for chest pain and palpitations.  Gastrointestinal: Negative for vomiting, diarrhea, blood per rectum. Genitourinary: Negative for dysuria, urgency, frequency and hematuria.  Musculoskeletal: Negative for myalgias, back pain and joint pain.  Skin: Negative for itching and rash.  Neurological: Negative for dizziness, tremors, focal weakness, seizures and loss of consciousness.  Endo/Heme/Allergies: Negative for environmental allergies.  Psychiatric/Behavioral: Negative for depression, suicidal ideas and hallucinations.  All other systems reviewed and are negative.  Physical Exam: Blood pressure (!) 170/74, pulse (!) 52, weight 203 lb (92.1 kg), SpO2 95 %. Gen:      No acute distress HEENT:  EOMI, sclera anicteric Neck:     No masses; no thyromegaly Lungs:    Clear to auscultation bilaterally; normal respiratory effort CV:         Regular rate and rhythm; no murmurs Abd:      + bowel sounds; soft, non-tender; no palpable masses, no distension Ext:    No edema; adequate peripheral perfusion Skin:      Warm and dry; no rash Neuro: alert and oriented x 3 Psych: normal mood and affect  Data Reviewed: PFTs 04/26/15 FVC 1.59 [52%), FEV1 1.25 (59%), F/F 79, TLC 2.69 [45%), DLCO 53% Severe restriction, moderate reduction in diffusion capacity. Diffusion capacity corrects for alveolar volume.  Imaging CTA 02/28/15 No PE, scarring, atelectasis  at lung bases.  CT chest 06/12/15 Images  reviewed. Subpleural reticulation, groundglass, 7 mm right lower lobe nodule.  CT high res 10/31/15 1. The appearance of the lungs appears very similar to the prior examination and is favored to reflect mild nonspecific interstitial pneumonia (NSIP). 2. 6 mm endobronchial nodule associated with the lateral margin of the proximal bronchus intermedius, similar to the prior study. This could represent an endobronchial polyp or other neoplasm. Correlation with bronchoscopy for direct inspection and potential biopsy is suggested if clinically appropriate. 3. Stable subpleural nodule with mean diameter of 6 mm. All images reviewed.  LABS 07/14/15 ANA: Negative [facility] Ab Screen:  <1.0 Anti-CCP:  >250 RF:  <10 Smith Ab:  <1.0 DS DNA Ab:  <1 Jo-1 Ab:  <1.0 SSA:  <1.0 SSB:  <1.0 SCL-70:  <1.0 RNP:  <1.0  10/30/15 ESR: 38 CRP: 14.9 ANA: Neg RF:16.4 Anti-CCP : 107   Bronchoscopy 02/01/2016 BAL-WBC 99, 14% lymphs, 79% monocyte macrophage, 7% neutrophils Bronchial brushing cytology-benign reactive changes.  Assessment:  IPAF (intersitital pneumonia with autoimmune features), NSIP Prior CT scan shows lower lobe changes that may represent NSIP but the extent of the changes are mild.  This was presumed to be secondary to IPAF given elevation in CCP but does not have any joint symptoms, no synovitis suggestive of overt rheumatoid arthritis.  He needs a reevaluation with a high-resolution CT of the chest.  He will also need PFTs but will defer it for now since he still recovering from his recent hospitalization. . There was a consideration for amiodarone toxicity as Justin Leach was been on amiodarone for a short time in Oct- Dec 2016. He has since been off the amiodarone since  December 2016.   Multiple endobronchial lesions S/p bronchoscopy. The appearance of the lesion is suggestive of benign process and may be related to his autoimmune process. Brushings were negative for malignancy.  We'll continue to follow this on repeat CT scan  Plan/Recommendations: - Follow-up high-resolution CT of the chest.  Marshell [name] MD [facility] 06/15/2018, 2:38 PM  CC: Lavone Orn, MD

## 2018-06-15 NOTE — Patient Instructions (Signed)
Continue for a high-resolution CT to be done in 1 month time for reevaluation of your lungs Continue supplemental oxygen for now Follow-up in clinic after CT scan.

## 2018-06-16 DIAGNOSIS — R41841 Cognitive communication deficit: Secondary | ICD-10-CM | POA: Diagnosis not present

## 2018-06-16 DIAGNOSIS — I130 Hypertensive heart and chronic kidney disease with heart failure and stage 1 through stage 4 chronic kidney disease, or unspecified chronic kidney disease: Secondary | ICD-10-CM | POA: Diagnosis not present

## 2018-06-16 DIAGNOSIS — M10371 Gout due to renal impairment, right ankle and foot: Secondary | ICD-10-CM | POA: Diagnosis not present

## 2018-06-16 DIAGNOSIS — I959 Hypotension, unspecified: Secondary | ICD-10-CM | POA: Diagnosis not present

## 2018-06-16 DIAGNOSIS — I10 Essential (primary) hypertension: Secondary | ICD-10-CM | POA: Diagnosis not present

## 2018-06-16 DIAGNOSIS — R279 Unspecified lack of coordination: Secondary | ICD-10-CM | POA: Diagnosis not present

## 2018-06-16 DIAGNOSIS — N183 Chronic kidney disease, stage 3 (moderate): Secondary | ICD-10-CM | POA: Diagnosis not present

## 2018-06-16 DIAGNOSIS — I480 Paroxysmal atrial fibrillation: Secondary | ICD-10-CM | POA: Diagnosis not present

## 2018-06-16 DIAGNOSIS — Z9181 History of falling: Secondary | ICD-10-CM | POA: Diagnosis not present

## 2018-06-16 DIAGNOSIS — J849 Interstitial pulmonary disease, unspecified: Secondary | ICD-10-CM | POA: Diagnosis not present

## 2018-06-16 DIAGNOSIS — I5032 Chronic diastolic (congestive) heart failure: Secondary | ICD-10-CM | POA: Diagnosis not present

## 2018-06-16 DIAGNOSIS — K219 Gastro-esophageal reflux disease without esophagitis: Secondary | ICD-10-CM | POA: Diagnosis not present

## 2018-06-16 DIAGNOSIS — Z7401 Bed confinement status: Secondary | ICD-10-CM | POA: Diagnosis not present

## 2018-06-16 DIAGNOSIS — E785 Hyperlipidemia, unspecified: Secondary | ICD-10-CM | POA: Diagnosis not present

## 2018-06-16 DIAGNOSIS — Z96612 Presence of left artificial shoulder joint: Secondary | ICD-10-CM | POA: Diagnosis not present

## 2018-06-16 DIAGNOSIS — S065X0D Traumatic subdural hemorrhage without loss of consciousness, subsequent encounter: Secondary | ICD-10-CM | POA: Diagnosis not present

## 2018-06-16 DIAGNOSIS — M6281 Muscle weakness (generalized): Secondary | ICD-10-CM | POA: Diagnosis not present

## 2018-06-16 DIAGNOSIS — M2550 Pain in unspecified joint: Secondary | ICD-10-CM | POA: Diagnosis not present

## 2018-06-16 DIAGNOSIS — E1122 Type 2 diabetes mellitus with diabetic chronic kidney disease: Secondary | ICD-10-CM | POA: Diagnosis not present

## 2018-06-16 DIAGNOSIS — M5090 Cervical disc disorder, unspecified, unspecified cervical region: Secondary | ICD-10-CM | POA: Diagnosis not present

## 2018-06-17 DIAGNOSIS — Z9181 History of falling: Secondary | ICD-10-CM | POA: Diagnosis not present

## 2018-06-17 DIAGNOSIS — M6281 Muscle weakness (generalized): Secondary | ICD-10-CM | POA: Diagnosis not present

## 2018-06-17 DIAGNOSIS — M5090 Cervical disc disorder, unspecified, unspecified cervical region: Secondary | ICD-10-CM | POA: Diagnosis not present

## 2018-06-17 DIAGNOSIS — R279 Unspecified lack of coordination: Secondary | ICD-10-CM | POA: Diagnosis not present

## 2018-06-17 DIAGNOSIS — S065X9A Traumatic subdural hemorrhage with loss of consciousness of unspecified duration, initial encounter: Secondary | ICD-10-CM | POA: Diagnosis not present

## 2018-06-17 DIAGNOSIS — Z96612 Presence of left artificial shoulder joint: Secondary | ICD-10-CM | POA: Diagnosis not present

## 2018-06-17 DIAGNOSIS — J849 Interstitial pulmonary disease, unspecified: Secondary | ICD-10-CM | POA: Diagnosis not present

## 2018-06-17 DIAGNOSIS — I10 Essential (primary) hypertension: Secondary | ICD-10-CM | POA: Diagnosis not present

## 2018-06-17 DIAGNOSIS — I480 Paroxysmal atrial fibrillation: Secondary | ICD-10-CM | POA: Diagnosis not present

## 2018-06-17 DIAGNOSIS — S065X0D Traumatic subdural hemorrhage without loss of consciousness, subsequent encounter: Secondary | ICD-10-CM | POA: Diagnosis not present

## 2018-06-17 DIAGNOSIS — N183 Chronic kidney disease, stage 3 (moderate): Secondary | ICD-10-CM | POA: Diagnosis not present

## 2018-06-17 DIAGNOSIS — R41841 Cognitive communication deficit: Secondary | ICD-10-CM | POA: Diagnosis not present

## 2018-06-17 DIAGNOSIS — M10371 Gout due to renal impairment, right ankle and foot: Secondary | ICD-10-CM | POA: Diagnosis not present

## 2018-06-17 DIAGNOSIS — K219 Gastro-esophageal reflux disease without esophagitis: Secondary | ICD-10-CM | POA: Diagnosis not present

## 2018-06-17 DIAGNOSIS — M2550 Pain in unspecified joint: Secondary | ICD-10-CM | POA: Diagnosis not present

## 2018-06-17 DIAGNOSIS — E1122 Type 2 diabetes mellitus with diabetic chronic kidney disease: Secondary | ICD-10-CM | POA: Diagnosis not present

## 2018-06-17 DIAGNOSIS — Z7401 Bed confinement status: Secondary | ICD-10-CM | POA: Diagnosis not present

## 2018-06-17 DIAGNOSIS — E785 Hyperlipidemia, unspecified: Secondary | ICD-10-CM | POA: Diagnosis not present

## 2018-06-17 DIAGNOSIS — I5032 Chronic diastolic (congestive) heart failure: Secondary | ICD-10-CM | POA: Diagnosis not present

## 2018-06-17 DIAGNOSIS — I130 Hypertensive heart and chronic kidney disease with heart failure and stage 1 through stage 4 chronic kidney disease, or unspecified chronic kidney disease: Secondary | ICD-10-CM | POA: Diagnosis not present

## 2018-06-18 ENCOUNTER — Non-Acute Institutional Stay (SKILLED_NURSING_FACILITY): Payer: Medicare Other | Admitting: Adult Health

## 2018-06-18 ENCOUNTER — Encounter: Payer: Self-pay | Admitting: Adult Health

## 2018-06-18 DIAGNOSIS — K219 Gastro-esophageal reflux disease without esophagitis: Secondary | ICD-10-CM

## 2018-06-18 DIAGNOSIS — E785 Hyperlipidemia, unspecified: Secondary | ICD-10-CM | POA: Diagnosis not present

## 2018-06-18 DIAGNOSIS — E1122 Type 2 diabetes mellitus with diabetic chronic kidney disease: Secondary | ICD-10-CM | POA: Diagnosis not present

## 2018-06-18 DIAGNOSIS — K5909 Other constipation: Secondary | ICD-10-CM

## 2018-06-18 DIAGNOSIS — R279 Unspecified lack of coordination: Secondary | ICD-10-CM | POA: Diagnosis not present

## 2018-06-18 DIAGNOSIS — Z7401 Bed confinement status: Secondary | ICD-10-CM | POA: Diagnosis not present

## 2018-06-18 DIAGNOSIS — I5032 Chronic diastolic (congestive) heart failure: Secondary | ICD-10-CM | POA: Diagnosis not present

## 2018-06-18 DIAGNOSIS — J849 Interstitial pulmonary disease, unspecified: Secondary | ICD-10-CM | POA: Diagnosis not present

## 2018-06-18 DIAGNOSIS — R0602 Shortness of breath: Secondary | ICD-10-CM | POA: Diagnosis not present

## 2018-06-18 DIAGNOSIS — M2550 Pain in unspecified joint: Secondary | ICD-10-CM | POA: Diagnosis not present

## 2018-06-18 DIAGNOSIS — I130 Hypertensive heart and chronic kidney disease with heart failure and stage 1 through stage 4 chronic kidney disease, or unspecified chronic kidney disease: Secondary | ICD-10-CM | POA: Diagnosis not present

## 2018-06-18 DIAGNOSIS — R5381 Other malaise: Secondary | ICD-10-CM | POA: Diagnosis not present

## 2018-06-18 DIAGNOSIS — E1169 Type 2 diabetes mellitus with other specified complication: Secondary | ICD-10-CM

## 2018-06-18 DIAGNOSIS — Z96612 Presence of left artificial shoulder joint: Secondary | ICD-10-CM | POA: Diagnosis not present

## 2018-06-18 DIAGNOSIS — R41841 Cognitive communication deficit: Secondary | ICD-10-CM | POA: Diagnosis not present

## 2018-06-18 DIAGNOSIS — J8410 Pulmonary fibrosis, unspecified: Secondary | ICD-10-CM

## 2018-06-18 DIAGNOSIS — M5090 Cervical disc disorder, unspecified, unspecified cervical region: Secondary | ICD-10-CM | POA: Diagnosis not present

## 2018-06-18 DIAGNOSIS — M6281 Muscle weakness (generalized): Secondary | ICD-10-CM | POA: Diagnosis not present

## 2018-06-18 DIAGNOSIS — S065X0D Traumatic subdural hemorrhage without loss of consciousness, subsequent encounter: Secondary | ICD-10-CM | POA: Diagnosis not present

## 2018-06-18 DIAGNOSIS — Z9181 History of falling: Secondary | ICD-10-CM | POA: Diagnosis not present

## 2018-06-18 DIAGNOSIS — N183 Chronic kidney disease, stage 3 (moderate): Secondary | ICD-10-CM | POA: Diagnosis not present

## 2018-06-18 DIAGNOSIS — M10371 Gout due to renal impairment, right ankle and foot: Secondary | ICD-10-CM | POA: Diagnosis not present

## 2018-06-18 DIAGNOSIS — I480 Paroxysmal atrial fibrillation: Secondary | ICD-10-CM | POA: Diagnosis not present

## 2018-06-18 NOTE — Progress Notes (Addendum)
Location:   [facility] Room Number: [place] of Service:  SNF (31)   CODE STATUS: Full Code  Allergies  Allergen Reactions  . 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 Patient subsequently taken off amiodarone.  . Nifedipine Other (See Comments)    Reaction: made gums swell up. Pt states that he had to have gum surgery after taking.     Chief Complaint  Patient presents with  . Medical Management of Chronic Issues    Pulmonary fibrosis; gastroesophageal reflux disease without esophagitis; chronic constipation; dyslipidemia associated with type 2 diabetes mellitus. Weekly follow up for the first 30 days post hospitalization.     HPI:  He is a 85 year old short term rehab patient being seen for the management of his chronic illnesses: pulmonary fibrosis; gastroesophageal reflux disease without esophagitis; chronic constipation; dyslipidemia associated with type 2 diabetes mellitus . He is having shortness of breath; he does have a cough; no reports of fevers. His 02 sats are low in the upper 80's. He does not have lower extremity edema. He denies any constipation or heart burn. His cbg readings are elevated.  Past Medical History:  Diagnosis Date  . Bradycardia    a. during 02/2015 admission - occasional HR in 40s while being treated for atrial fib.  . Cervical disc disease   . Chronic diastolic CHF (congestive heart failure) ([place])    a. Dx 02/2015 -  2D echo 03/02/15 showed severe focal basal hypertrophy of the septum, EF 55-60%, mild MR, no effusion.  . CKD (chronic kidney disease), stage III ([place])   . Coronary artery calcification seen on CT scan    a. Nuc 02/2015 was normal.  . Dyslipidemia   . GERD (gastroesophageal reflux disease)   . Hypertension   . 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 PAF  (paroxysmal atrial fibrillation) ([place])    a. Dx 02/2015 - in and out on tele, rx'd Coumadin and amiodarone.  . Pneumonia 2007  . Shortness of breath dyspnea    WITH EXERTION  . Type II diabetes mellitus ([place])     Past Surgical History:  Procedure Laterality Date  . CATARACT EXTRACTION W/ INTRAOCULAR LENS  IMPLANT, BILATERAL Bilateral   . CERVICAL DISC SURGERY     "i've had 3 neck ORs; not sure what kind; all thru the back of my neck"  . CHOLECYSTECTOMY    . COLONOSCOPY WITH PROPOFOL N/A 01/02/2016   Procedure: COLONOSCOPY WITH PROPOFOL;  Surgeon: [name], MD;  Location: [facility];  Service: Endoscopy;  Laterality: N/A;  . EYE SURGERY  1930's   "don't know what for" (05/11/2013)  . JOINT REPLACEMENT     bilateral knees, elbows, and shoulders (on 05/11/2013 pt denies all joint replacements"   . KNEE ARTHROPLASTY     "had one scope; one open knee OR; not sure which on which side" (05/10/2013)  . KNEE ARTHROSCOPY     "had one scope; one open knee OR; not sure which on which side" (05/10/2013)  . POSTERIOR FUSION CERVICAL SPINE    . REVERSE SHOULDER ARTHROPLASTY Left 05/19/2018   Procedure: REVERSE SHOULDER ARTHROPLASTY;  Surgeon: [name], MD;  Location: [facility];  Service: Orthopedics;  Laterality: Left;  . ROTATOR CUFF REPAIR Bilateral   . VIDEO BRONCHOSCOPY Bilateral 02/01/2016  Procedure: VIDEO BRONCHOSCOPY WITHOUT FLUORO;  Surgeon: Marshell [name], MD;  Location: [facility];  Service: Cardiopulmonary;  Laterality: Bilateral;    Social History   Socioeconomic History  . Marital status: Married    Spouse name: Vaughan Basta  . Number of children: 0  . Years of education: Not on file  . Highest education level: Not on file  Occupational History  . Occupation: Retired from Architect work.  Social Needs  . Financial resource strain: Not on file  . Food insecurity:    Worry: Not on file    Inability: Not on file  . Transportation needs:    Medical: Not on  file    Non-medical: Not on file  Tobacco Use  . Smoking status: Never Smoker  . Smokeless tobacco: Former Systems developer    Types: Chew  Substance and Sexual Activity  . Alcohol use: No  . Drug use: No    Comment: used chew when I was a teenager per patient   . Sexual activity: Not on file  Lifestyle  . Physical activity:    Days per week: Not on file    Minutes per session: Not on file  . Stress: Not on file  Relationships  . Social connections:    Talks on phone: Not on file    Gets together: Not on file    Attends religious service: Not on file    Active member of club or organization: Not on file    Attends meetings of clubs or organizations: Not on file    Relationship status: Not on file  . Intimate partner violence:    Fear of current or ex partner: Not on file    Emotionally abused: Not on file    Physically abused: Not on file    Forced sexual activity: Not on file  Other Topics Concern  . Not on file  Social History Narrative   Married.  Lives with wife.  Ambulates independently.  Has a dog names is named Programmer, applications  .   Retired - worked for [employer] paper   No Sport and exercise psychologist Pulmonary:   No known bird, mold, or hot tub exposure.   Family History  Problem Relation Age of Onset  . Diabetes Mellitus II Paternal Grandmother   . Heart disease Neg Hx        He does not know his father's history.   . Cancer Neg Hx   . Diabetes Neg Hx   . CAD Neg Hx   . Lung disease Neg Hx   . Rheumatologic disease Neg Hx       VITAL SIGNS BP (!) 168/52   Pulse 70   Temp 98.6 F (37 C)   Resp 19   Ht 5\' 7"  (1.702 m)   Wt 194 lb 9.6 oz (88.3 kg)   SpO2 96%   BMI 30.48 kg/m   Outpatient Encounter Medications as of 06/18/2018  Medication Sig Note  . acetaminophen (TYLENOL) 325 MG tablet Take 650 mg by mouth every 6 (six) hours as needed.   Marland Kitchen albuterol (PROVENTIL) (2.5 MG/3ML) 0.083% nebulizer solution Take 2.5 mg by nebulization 4 (four) times daily as  needed for wheezing or shortness of breath.   Marland Kitchen atorvastatin (LIPITOR) 40 MG tablet Take 40 mg by mouth every evening.    . bisacodyl (DULCOLAX) 10 MG suppository Place 1 suppository (10 mg total) rectally daily as needed for moderate constipation.   . Cholecalciferol (VITAMIN D-3) 25 MCG (  1000 UT) CAPS Take 1 capsule (1,000 Units total) by mouth daily.   Marland Kitchen docusate sodium (COLACE) 100 MG capsule Take 1 capsule (100 mg total) by mouth 2 (two) times daily.   . febuxostat (ULORIC) 40 MG tablet Take 40 mg by mouth daily.   . furosemide (LASIX) 40 MG tablet Take 1 tablet (40 mg total) by mouth daily.   . hydrALAZINE (APRESOLINE) 25 MG tablet Take 1 tablet (25 mg total) by mouth every 8 (eight) hours.   . hydrocortisone cream 1 % Apply topically 2 (two) times daily. Apply to itchy areas on the left arm and chest   . insulin aspart (NOVOLOG) 100 UNIT/ML injection Inject 0-9 Units into the skin 3 (three) times daily with meals. Sliding scale CBG 70 - 120: 0 units CBG 121 - 150: 1 unit,  CBG 151 - 200: 2 units,  CBG 201 - 250: 3 units,  CBG 251 - 300: 5 units,  CBG 301 - 350: 7 units,  CBG 351 - 400: 9 units   CBG > 400: 9 units and notify your MD   . insulin detemir (LEVEMIR) 100 UNIT/ML injection Inject 0.26 mLs (26 Units total) into the skin at bedtime.   . isosorbide mononitrate (IMDUR) 60 MG 24 hr tablet Take 1 tablet (60 mg total) by mouth daily.   . metoprolol (LOPRESSOR) 50 MG tablet Take 50 mg by mouth 2 (two) times daily.   . NON FORMULARY Diet Type:  NAS, NCS   . omeprazole (PRILOSEC) 20 MG capsule Take 20 mg by mouth daily.  11/22/2014: .  Marland Kitchen OXYGEN Inhale 3 L/min into the lungs continuous.   . polyethylene glycol (MIRALAX / GLYCOLAX) packet Take 17 g by mouth daily as needed for mild constipation.   . sodium chloride (OCEAN) 0.65 % SOLN nasal spray Place 1 spray into both nostrils as needed for congestion.   . vitamin B-12 (CYANOCOBALAMIN) 1000 MCG tablet Take 1 tablet (1,000 mcg total) by mouth  daily.   . [DISCONTINUED] albuterol (PROVENTIL) (2.5 MG/3ML) 0.083% nebulizer solution Take 3 mLs (2.5 mg total) by nebulization every 4 (four) hours as needed for wheezing or shortness of breath. (Patient not taking: Reported on 06/18/2018)   . [DISCONTINUED] oxyCODONE-acetaminophen (PERCOCET/ROXICET) 5-325 MG tablet Take 1 tablet by mouth every 6 (six) hours as needed for moderate pain or severe pain. (Patient not taking: Reported on 06/18/2018)    No facility-administered encounter medications on file as of 06/18/2018.      SIGNIFICANT DIAGNOSTIC EXAMS  PREVIOUS:   05-13-18: ct of head:  1. Acute, left greater than right subdural hematomas measuring up to10 mm in thickness on the left overlying the left frontal, temporaland parietal lobes and up to 3 mm overlying the right temporal lobe. No midline shift  05-13-18: ct of left shoulder:  Neer category 2 part fracture of the proximal left humerus with one shaft width anterior displacement of the humeral shaft relative to the humeral head. Nondisplaced fracture undermining the greater tuberosity is identified. No joint dislocation is seen.  05-14-18: ct of head:  1. Decreased size of bilateral acute subdural hematomas measuring to 6 mm on the LEFT. No midline shift. 2. Image findings of chronic communicating hydrocephalus.  05-19-18: left shoulder x-ray: Interval left shoulder replacement for proximal humerus fracture with expected postsurgical changes  05-23-18: chest x-ray: 1. Diminished aeration to left base compared with prior exam. 2. Suspect small right pleural effusion.  05-25-18: 2-d echo: - Left ventricle: The cavity size  was normal. Systolic function was vigorous. The estimated ejection fraction was in the range of 65% to 70%. Wall motion was normal; there were no regional wall motion abnormalities. Doppler parameters are consistent with abnormal left ventricular relaxation (grade 1 diastolic dysfunction). - Aortic valve:  Trileaflet; mildly thickened, mildly calcified leaflets.  NO NEW EXAMS.   LABS REVIEWED: PREVIOUS;   05-13-18: wbc 19.0; hgb 14.6; hct 46.5; mcv 96.1 plt 243 glucose 186; bun 31; creat 1.66; k+ 5.5; na++ 140; ca 9.6 liver normal albumin 3.4 blood culture: no growth  05-15-18; wbc 15.5; hgb 11.3; hct 36.4; mcv 97.6 plt 199 glucose 194; bun 50; creat 2.90; k+ 4.9; na++ 138  05-18-18: glucose 115; bun 33; creat 1.44; k+ 4.3; na++ 136; ca 8.2  05-27-18: wbc 13.1; hgb 10.5; hct 33.3; mcv 93.;8 plt 327;glucose 86; bun 39; creat 1.93; k+ 3.8; na++ 136; ca 8.8  06-02-18: uric acid: 10.1  NO NEW LABS.   Review of Systems  Constitutional: Negative for malaise/fatigue.  Respiratory: Positive for cough and shortness of breath. Negative for sputum production.   Cardiovascular: Negative for chest pain, palpitations and leg swelling.  Gastrointestinal: Negative for abdominal pain, constipation and heartburn.  Musculoskeletal: Negative for back pain, joint pain and myalgias.  Skin: Negative.   Neurological: Negative for dizziness.  Psychiatric/Behavioral: The patient is not nervous/anxious.     Physical Exam Constitutional:      General: He is not in acute distress.    Appearance: He is well-developed. He is obese. He is not diaphoretic.  Neck:     Musculoskeletal: Neck supple.     Thyroid: No thyromegaly.  Cardiovascular:     Rate and Rhythm: Normal rate and regular rhythm.     Pulses: Normal pulses.     Heart sounds: Normal heart sounds.  Pulmonary:     Effort: Pulmonary effort is normal. No respiratory distress.     Breath sounds: Wheezing present.     Comments: 02 dependent  Abdominal:     General: Bowel sounds are normal. There is no distension.     Palpations: Abdomen is soft.     Tenderness: There is no abdominal tenderness.  Musculoskeletal:     Right lower leg: No edema.     Left lower leg: No edema.     Comments: Is able to move all extremities   Lymphadenopathy:     Cervical:  No cervical adenopathy.  Skin:    General: Skin is warm and dry.  Neurological:     Mental Status: He is alert and oriented to person, place, and time.  Psychiatric:        Mood and Affect: Mood normal.     ASSESSMENT/ PLAN:  TODAY:   1. Dyslipidemia associated with type 2 diabetes mellitus: is stable will continue lipitor 40 mg daily   2. Pulmonary fibrosis: is worse  is 02 dependent at 3L/min at this time was not using prior to hospitalization. Will continue albuterol neb treatment four times daily as needed;   Will get a chest x-ray.   3. GERD without esophagitis: is stable will continue prilosec 20 mg daily   4. Chronic constipation: is stable will continue colace twice daily and miralax daily as needed   PREVIOUS  5.  PAF( paroxysmal atrial fibrillation): heart rate stable will continue lopressor 50 mg twice daily for rate control; is not on anticoagulation due to subdural hematoma  6. Type 2 diabetes mellitus with stage 3 chronic kidney disease with long term  current use of insulin: is worse cbg readings are elevated: will change to levemir 35 units nightly; and novolog to 8 units with meals.   7. SDH: (subdural hematoma): is neurologically stable; will continue to monitor and will follow up with neurosurgery as indicated.   8. CKD stage 3 due to type 2 diabetes mellitus: is stable bun 39; creat 1.93  9. Chronic gout right great toe: is stable will continue uloric 40 mg daily   10. Closed displaced comminuted fracture of left of left humerus sequela: is status post reverse shoulder arthroplasty on 05-19-18. Will continue therapy as directed and will follow up with orthopedics as directed.   11. Chronic diastolic CHF (congestive heart failure): is stable EF 65-70% (05-25-18): will continue lasix 40 mg daily and lopressor 50 mg twice daily and imdur 60 mg daily    12. Coronary artery calcification seen on CT scan: is stable will continue imdur 60 mg daily   13. Hypertensive  heart and kidney disease with chronic diastolic congestive heart failure and stage 3 chronic kidney disease: is stable b/p 148/84 will continue apresoline 25 mg every 8 hours; imdur 60 mg daily lopressor 50 mg twice daily   Will check cbc; cmp    MD is aware of resident's narcotic use and is in agreement with current plan of care. We will attempt to wean resident as apropriate   Ok Edwards NP [facility]  Contact [phone] Monday through Friday 8am- 5pm  After hours call [phone]

## 2018-06-19 ENCOUNTER — Encounter: Payer: Self-pay | Admitting: Adult Health

## 2018-06-19 ENCOUNTER — Other Ambulatory Visit (HOSPITAL_COMMUNITY): Payer: Self-pay | Admitting: Neurological Surgery

## 2018-06-19 ENCOUNTER — Other Ambulatory Visit: Payer: Self-pay | Admitting: Neurological Surgery

## 2018-06-19 ENCOUNTER — Non-Acute Institutional Stay (SKILLED_NURSING_FACILITY): Payer: Medicare Other | Admitting: Adult Health

## 2018-06-19 DIAGNOSIS — N183 Chronic kidney disease, stage 3 unspecified: Secondary | ICD-10-CM

## 2018-06-19 DIAGNOSIS — S065X9A Traumatic subdural hemorrhage with loss of consciousness of unspecified duration, initial encounter: Secondary | ICD-10-CM

## 2018-06-19 DIAGNOSIS — R279 Unspecified lack of coordination: Secondary | ICD-10-CM | POA: Diagnosis not present

## 2018-06-19 DIAGNOSIS — J849 Interstitial pulmonary disease, unspecified: Secondary | ICD-10-CM | POA: Diagnosis not present

## 2018-06-19 DIAGNOSIS — J189 Pneumonia, unspecified organism: Secondary | ICD-10-CM | POA: Diagnosis not present

## 2018-06-19 DIAGNOSIS — S065X0D Traumatic subdural hemorrhage without loss of consciousness, subsequent encounter: Secondary | ICD-10-CM | POA: Diagnosis not present

## 2018-06-19 DIAGNOSIS — M5090 Cervical disc disorder, unspecified, unspecified cervical region: Secondary | ICD-10-CM | POA: Diagnosis not present

## 2018-06-19 DIAGNOSIS — I5032 Chronic diastolic (congestive) heart failure: Secondary | ICD-10-CM | POA: Diagnosis not present

## 2018-06-19 DIAGNOSIS — I130 Hypertensive heart and chronic kidney disease with heart failure and stage 1 through stage 4 chronic kidney disease, or unspecified chronic kidney disease: Secondary | ICD-10-CM

## 2018-06-19 DIAGNOSIS — Z96612 Presence of left artificial shoulder joint: Secondary | ICD-10-CM | POA: Diagnosis not present

## 2018-06-19 DIAGNOSIS — E785 Hyperlipidemia, unspecified: Secondary | ICD-10-CM | POA: Diagnosis not present

## 2018-06-19 DIAGNOSIS — K219 Gastro-esophageal reflux disease without esophagitis: Secondary | ICD-10-CM | POA: Diagnosis not present

## 2018-06-19 DIAGNOSIS — R41841 Cognitive communication deficit: Secondary | ICD-10-CM | POA: Diagnosis not present

## 2018-06-19 DIAGNOSIS — M6281 Muscle weakness (generalized): Secondary | ICD-10-CM | POA: Diagnosis not present

## 2018-06-19 DIAGNOSIS — S065XAA Traumatic subdural hemorrhage with loss of consciousness status unknown, initial encounter: Secondary | ICD-10-CM

## 2018-06-19 DIAGNOSIS — E1122 Type 2 diabetes mellitus with diabetic chronic kidney disease: Secondary | ICD-10-CM | POA: Diagnosis not present

## 2018-06-19 DIAGNOSIS — M10371 Gout due to renal impairment, right ankle and foot: Secondary | ICD-10-CM | POA: Diagnosis not present

## 2018-06-19 DIAGNOSIS — Z9181 History of falling: Secondary | ICD-10-CM | POA: Diagnosis not present

## 2018-06-19 DIAGNOSIS — I480 Paroxysmal atrial fibrillation: Secondary | ICD-10-CM | POA: Diagnosis not present

## 2018-06-19 NOTE — Progress Notes (Signed)
Location:   [facility] Room Number: [place] of Service:  SNF (31)   CODE STATUS: Full Code  Allergies  Allergen Reactions  . 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 Patient subsequently taken off amiodarone.  . Nifedipine Other (See Comments)    Reaction: made gums swell up. Pt states that he had to have gum surgery after taking.     Chief Complaint  Patient presents with  . Acute Visit    Follow up Chest Xray results    HPI:  His x-ray has demonstrated left lung base infiltrates and left pleural effusion. He was started on doxycycline last night. He continues to have some shortness of breath and coughing. There are no reports of fevers present.   Past Medical History:  Diagnosis Date  . Bradycardia    a. during 02/2015 admission - occasional HR in 40s while being treated for atrial fib.  . Cervical disc disease   . Chronic diastolic CHF (congestive heart failure) ([place])    a. Dx 02/2015 -  2D echo 03/02/15 showed severe focal basal hypertrophy of the septum, EF 55-60%, mild MR, no effusion.  . CKD (chronic kidney disease), stage III ([place])   . Coronary artery calcification seen on CT scan    a. Nuc 02/2015 was normal.  . Dyslipidemia   . GERD (gastroesophageal reflux disease)   . Hypertension   . 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 PAF (paroxysmal atrial fibrillation) ([place])    a. Dx 02/2015 - in and out on tele, rx'd Coumadin and amiodarone.  . Pneumonia 2007  . Shortness of breath dyspnea    WITH EXERTION  . Type II diabetes mellitus ([place])     Past Surgical History:  Procedure Laterality Date  . CATARACT EXTRACTION W/ INTRAOCULAR LENS  IMPLANT, BILATERAL Bilateral   . CERVICAL DISC SURGERY     "i've had 3 neck ORs; not sure what kind; all thru the back of my neck"  .  CHOLECYSTECTOMY    . COLONOSCOPY WITH PROPOFOL N/A 01/02/2016   Procedure: COLONOSCOPY WITH PROPOFOL;  Surgeon: [name], MD;  Location: [facility];  Service: Endoscopy;  Laterality: N/A;  . EYE SURGERY  1930's   "don't know what for" (05/11/2013)  . JOINT REPLACEMENT     bilateral knees, elbows, and shoulders (on 05/11/2013 pt denies all joint replacements"   . KNEE ARTHROPLASTY     "had one scope; one open knee OR; not sure which on which side" (05/10/2013)  . KNEE ARTHROSCOPY     "had one scope; one open knee OR; not sure which on which side" (05/10/2013)  . POSTERIOR FUSION CERVICAL SPINE    . REVERSE SHOULDER ARTHROPLASTY Left 05/19/2018   Procedure: REVERSE SHOULDER ARTHROPLASTY;  Surgeon: [name], MD;  Location: [facility];  Service: Orthopedics;  Laterality: Left;  . ROTATOR CUFF REPAIR Bilateral   . VIDEO BRONCHOSCOPY Bilateral 02/01/2016   Procedure: VIDEO BRONCHOSCOPY WITHOUT FLUORO;  Surgeon: Marshell [name], MD;  Location: [facility];  Service: Cardiopulmonary;  Laterality: Bilateral;    Social History   Socioeconomic History  . Marital status: Married    Spouse name: Vaughan Basta  . Number of children: 0  . Years of education: Not on file  . Highest education level: Not on file  Occupational History  .  Occupation: Retired from Architect work.  Social Needs  . Financial resource strain: Not on file  . Food insecurity:    Worry: Not on file    Inability: Not on file  . Transportation needs:    Medical: Not on file    Non-medical: Not on file  Tobacco Use  . Smoking status: Never Smoker  . Smokeless tobacco: Former Systems developer    Types: Chew  Substance and Sexual Activity  . Alcohol use: No  . Drug use: No    Comment: used chew when I was a teenager per patient   . Sexual activity: Not on file  Lifestyle  . Physical activity:    Days per week: Not on file    Minutes per session: Not on file  . Stress: Not on file  Relationships  . Social  connections:    Talks on phone: Not on file    Gets together: Not on file    Attends religious service: Not on file    Active member of club or organization: Not on file    Attends meetings of clubs or organizations: Not on file    Relationship status: Not on file  . Intimate partner violence:    Fear of current or ex partner: Not on file    Emotionally abused: Not on file    Physically abused: Not on file    Forced sexual activity: Not on file  Other Topics Concern  . Not on file  Social History Narrative   Married.  Lives with wife.  Ambulates independently.  Has a dog names is named Programmer, applications  .   Retired - worked for [employer] paper   No Sport and exercise psychologist Pulmonary:   No known bird, mold, or hot tub exposure.   Family History  Problem Relation Age of Onset  . Diabetes Mellitus II Paternal Grandmother   . Heart disease Neg Hx        He does not know his father's history.   . Cancer Neg Hx   . Diabetes Neg Hx   . CAD Neg Hx   . Lung disease Neg Hx   . Rheumatologic disease Neg Hx       VITAL SIGNS BP (!) 160/44   Pulse 76   Temp 97.8 F (36.6 C)   Resp 18   Ht 5\' 7"  (1.702 m)   Wt 193 lb 3.2 oz (87.6 kg)   SpO2 97%   BMI 30.26 kg/m   Outpatient Encounter Medications as of 06/19/2018  Medication Sig Note  . acetaminophen (TYLENOL) 325 MG tablet Take 650 mg by mouth every 6 (six) hours as needed.   Marland Kitchen albuterol (PROVENTIL) (2.5 MG/3ML) 0.083% nebulizer solution Take 2.5 mg by nebulization 4 (four) times daily as needed for wheezing or shortness of breath.   Marland Kitchen atorvastatin (LIPITOR) 40 MG tablet Take 40 mg by mouth every evening.    . bisacodyl (DULCOLAX) 10 MG suppository Place 1 suppository (10 mg total) rectally daily as needed for moderate constipation.   . Cholecalciferol (VITAMIN D-3) 25 MCG (1000 UT) CAPS Take 1 capsule (1,000 Units total) by mouth daily.   Marland Kitchen docusate sodium (COLACE) 100 MG capsule Take 1 capsule (100 mg total) by mouth 2  (two) times daily.   Marland Kitchen doxycycline (DORYX) 100 MG EC tablet Take 100 mg by mouth 2 (two) times daily.   . febuxostat (ULORIC) 40 MG tablet Take 40 mg by mouth daily.   Marland Kitchen  furosemide (LASIX) 40 MG tablet Take 1 tablet (40 mg total) by mouth daily.   . hydrALAZINE (APRESOLINE) 25 MG tablet Take 1 tablet (25 mg total) by mouth every 8 (eight) hours.   . hydrocortisone cream 1 % Apply topically 2 (two) times daily. Apply to itchy areas on the left arm and chest   . insulin aspart (NOVOLOG FLEXPEN) 100 UNIT/ML FlexPen Inject 8 Units into the skin 3 (three) times daily with meals.   . Insulin Detemir (LEVEMIR FLEXTOUCH) 100 UNIT/ML Pen Inject 35 Units into the skin at bedtime.   . isosorbide mononitrate (IMDUR) 60 MG 24 hr tablet Take 1 tablet (60 mg total) by mouth daily.   . metoprolol (LOPRESSOR) 50 MG tablet Take 50 mg by mouth 2 (two) times daily.   . NON FORMULARY Diet Type:  NAS, NCS   . omeprazole (PRILOSEC) 20 MG capsule Take 20 mg by mouth daily.  11/22/2014: .  Marland Kitchen OXYGEN Inhale 3 L/min into the lungs continuous.   . polyethylene glycol (MIRALAX / GLYCOLAX) packet Take 17 g by mouth daily as needed for mild constipation.   . Probiotic Product (RISA-BID PROBIOTIC) TABS Take 1 tablet by mouth 2 (two) times daily.   . sodium chloride (OCEAN) 0.65 % SOLN nasal spray Place 1 spray into both nostrils as needed for congestion.   . vitamin B-12 (CYANOCOBALAMIN) 1000 MCG tablet Take 1 tablet (1,000 mcg total) by mouth daily.   . [DISCONTINUED] insulin aspart (NOVOLOG) 100 UNIT/ML injection Inject 0-9 Units into the skin 3 (three) times daily with meals. Sliding scale CBG 70 - 120: 0 units CBG 121 - 150: 1 unit,  CBG 151 - 200: 2 units,  CBG 201 - 250: 3 units,  CBG 251 - 300: 5 units,  CBG 301 - 350: 7 units,  CBG 351 - 400: 9 units   CBG > 400: 9 units and notify your MD (Patient not taking: Reported on 06/19/2018)   . [DISCONTINUED] insulin detemir (LEVEMIR) 100 UNIT/ML injection Inject 0.26 mLs (26 Units  total) into the skin at bedtime. (Patient not taking: Reported on 06/19/2018)    No facility-administered encounter medications on file as of 06/19/2018.      SIGNIFICANT DIAGNOSTIC EXAMS   PREVIOUS:   05-13-18: ct of head:  1. Acute, left greater than right subdural hematomas measuring up to10 mm in thickness on the left overlying the left frontal, temporaland parietal lobes and up to 3 mm overlying the right temporal lobe. No midline shift  05-13-18: ct of left shoulder:  Neer category 2 part fracture of the proximal left humerus with one shaft width anterior displacement of the humeral shaft relative to the humeral head. Nondisplaced fracture undermining the greater tuberosity is identified. No joint dislocation is seen.  05-14-18: ct of head:  1. Decreased size of bilateral acute subdural hematomas measuring to 6 mm on the LEFT. No midline shift. 2. Image findings of chronic communicating hydrocephalus.  05-19-18: left shoulder x-ray: Interval left shoulder replacement for proximal humerus fracture with expected postsurgical changes  05-23-18: chest x-ray: 1. Diminished aeration to left base compared with prior exam. 2. Suspect small right pleural effusion.  05-25-18: 2-d echo: - Left ventricle: The cavity size was normal. Systolic function was vigorous. The estimated ejection fraction was in the range of 65% to 70%. Wall motion was normal; there were no regional wall motion abnormalities. Doppler parameters are consistent with abnormal left ventricular relaxation (grade 1 diastolic dysfunction). - Aortic valve: Trileaflet; mildly  thickened, mildly calcified leaflets.  TODAY  06-19-18: chest x-ray: pulmonary infiltrate left lung base small left pleural effusion consistent with pneumonia/chf.    LABS REVIEWED: PREVIOUS;   05-13-18: wbc 19.0; hgb 14.6; hct 46.5; mcv 96.1 plt 243 glucose 186; bun 31; creat 1.66; k+ 5.5; na++ 140; ca 9.6 liver normal albumin 3.4 blood culture: no  growth  05-15-18; wbc 15.5; hgb 11.3; hct 36.4; mcv 97.6 plt 199 glucose 194; bun 50; creat 2.90; k+ 4.9; na++ 138  05-18-18: glucose 115; bun 33; creat 1.44; k+ 4.3; na++ 136; ca 8.2  05-27-18: wbc 13.1; hgb 10.5; hct 33.3; mcv 93.;8 plt 327;glucose 86; bun 39; creat 1.93; k+ 3.8; na++ 136; ca 8.8  06-02-18: uric acid: 10.1  NO NEW LABS.   Review of Systems  Constitutional: Negative for malaise/fatigue.  Respiratory: Positive for cough and shortness of breath.   Cardiovascular: Negative for chest pain, palpitations and leg swelling.  Gastrointestinal: Negative for abdominal pain, constipation and heartburn.  Musculoskeletal: Negative for back pain, joint pain and myalgias.  Skin: Negative.   Neurological: Negative for dizziness.  Psychiatric/Behavioral: The patient is not nervous/anxious.     Physical Exam Constitutional:      General: He is not in acute distress.    Appearance: He is well-developed. He is obese. He is not diaphoretic.  Neck:     Musculoskeletal: Neck supple.     Thyroid: No thyromegaly.  Cardiovascular:     Rate and Rhythm: Normal rate and regular rhythm.     Pulses: Normal pulses.     Heart sounds: Normal heart sounds.  Pulmonary:     Effort: Pulmonary effort is normal. No respiratory distress.     Breath sounds: Wheezing present.     Comments: 02 dependent  Abdominal:     General: Bowel sounds are normal. There is no distension.     Palpations: Abdomen is soft.     Tenderness: There is no abdominal tenderness.  Musculoskeletal:     Right lower leg: No edema.     Left lower leg: No edema.     Comments: Is able to move all extremities   Lymphadenopathy:     Cervical: No cervical adenopathy.  Skin:    General: Skin is warm and dry.  Neurological:     Mental Status: He is alert and oriented to person, place, and time.  Psychiatric:        Mood and Affect: Mood normal.      ASSESSMENT/ PLAN:  TODAY:   1. Hypertensive heart disease with chronic  diastolic congestive heart failure and stage 3 chronic kidney disease 2. HCAP  Will continue his doxycycline 100 mg twice daily through 06-28-18 with probiotic Will give lasix 40 mg one time.    MD is aware of resident's narcotic use and is in agreement with current plan of care. We will attempt to wean resident as apropriate   Ok Edwards NP [facility]  Contact [phone] Monday through Friday 8am- 5pm  After hours call [phone]

## 2018-06-20 ENCOUNTER — Encounter
Admission: RE | Admit: 2018-06-20 | Discharge: 2018-06-20 | Disposition: A | Discharge status: Home / Self Care | Payer: Medicare Other | Source: Ambulatory Visit | Attending: Internal Medicine | Admitting: Internal Medicine

## 2018-06-22 ENCOUNTER — Encounter: Payer: Self-pay | Admitting: Adult Health

## 2018-06-22 ENCOUNTER — Telehealth: Payer: Self-pay | Admitting: Podiatry

## 2018-06-22 ENCOUNTER — Non-Acute Institutional Stay: Payer: Medicare Other | Admitting: Nurse Practitioner

## 2018-06-22 ENCOUNTER — Non-Acute Institutional Stay (SKILLED_NURSING_FACILITY): Payer: Medicare Other | Admitting: Adult Health

## 2018-06-22 DIAGNOSIS — S065XAA Traumatic subdural hemorrhage with loss of consciousness status unknown, initial encounter: Secondary | ICD-10-CM

## 2018-06-22 DIAGNOSIS — S065X9A Traumatic subdural hemorrhage with loss of consciousness of unspecified duration, initial encounter: Secondary | ICD-10-CM | POA: Diagnosis not present

## 2018-06-22 DIAGNOSIS — J189 Pneumonia, unspecified organism: Secondary | ICD-10-CM

## 2018-06-22 DIAGNOSIS — S42352S Displaced comminuted fracture of shaft of humerus, left arm, sequela: Secondary | ICD-10-CM | POA: Diagnosis not present

## 2018-06-22 NOTE — Telephone Encounter (Signed)
I was seen by one of the young doctors last year for gout and got an injection. Can someone call me back and let me know what it was so I can get it fixed again. Thank you.

## 2018-06-22 NOTE — Telephone Encounter (Addendum)
Unable to contact by mobile phone unavailable. Unable to contact pt home phone is busy.

## 2018-06-22 NOTE — Progress Notes (Signed)
Location:   [facility] Room Number: [place] of Service:  SNF (31)    CODE STATUS: Full Code  Allergies  Allergen Reactions  . 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 Patient subsequently taken off amiodarone.  . Nifedipine Other (See Comments)    Reaction: made gums swell up. Pt states that he had to have gum surgery after taking.     Chief Complaint  Patient presents with  . Discharge Note    Discharging to SNF, [facility] on 06/23/2018    HPI:  He is being discharged to skilled [place]. He will not need dme; his prescriptions will be provided by the receiving facility. His medical care will be provided by the receiving facility. He had been hospitalized for a subdural hematoma and left humeral head fracture. He will need long term SNF care and is being transferred to another SNF.    Past Medical History:  Diagnosis Date  . Bradycardia    a. during 02/2015 admission - occasional HR in 40s while being treated for atrial fib.  . Cervical disc disease   . Chronic diastolic CHF (congestive heart failure) ([place])    a. Dx 02/2015 -  2D echo 03/02/15 showed severe focal basal hypertrophy of the septum, EF 55-60%, mild MR, no effusion.  . CKD (chronic kidney disease), stage III ([place])   . Coronary artery calcification seen on CT scan    a. Nuc 02/2015 was normal.  . Dyslipidemia   . GERD (gastroesophageal reflux disease)   . Hypertension   . 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 PAF (paroxysmal atrial fibrillation) ([place])    a. Dx 02/2015 - in and out on tele, rx'd Coumadin and amiodarone.  . Pneumonia 2007  . Shortness of breath dyspnea    WITH EXERTION  . Type II diabetes mellitus ([place])     Past Surgical History:  Procedure Laterality Date  . CATARACT EXTRACTION W/  INTRAOCULAR LENS  IMPLANT, BILATERAL Bilateral   . CERVICAL DISC SURGERY     "i've had 3 neck ORs; not sure what kind; all thru the back of my neck"  . CHOLECYSTECTOMY    . COLONOSCOPY WITH PROPOFOL N/A 01/02/2016   Procedure: COLONOSCOPY WITH PROPOFOL;  Surgeon: [name], MD;  Location: [facility];  Service: Endoscopy;  Laterality: N/A;  . EYE SURGERY  1930's   "don't know what for" (05/11/2013)  . JOINT REPLACEMENT     bilateral knees, elbows, and shoulders (on 05/11/2013 pt denies all joint replacements"   . KNEE ARTHROPLASTY     "had one scope; one open knee OR; not sure which on which side" (05/10/2013)  . KNEE ARTHROSCOPY     "had one scope; one open knee OR; not sure which on which side" (05/10/2013)  . POSTERIOR FUSION CERVICAL SPINE    . REVERSE SHOULDER ARTHROPLASTY Left 05/19/2018   Procedure: REVERSE SHOULDER ARTHROPLASTY;  Surgeon: [name], MD;  Location: [place];  Service: Orthopedics;  Laterality: Left;  . ROTATOR CUFF REPAIR Bilateral   . VIDEO BRONCHOSCOPY Bilateral 02/01/2016   Procedure: VIDEO BRONCHOSCOPY WITHOUT FLUORO;  Surgeon: Marshell [name], MD;  Location: [facility];  Service: Cardiopulmonary;  Laterality: Bilateral;    Social History   Socioeconomic History  . Marital status: Married  Spouse name: Vaughan Basta  . Number of children: 0  . Years of education: Not on file  . Highest education level: Not on file  Occupational History  . Occupation: Retired from Architect work.  Social Needs  . Financial resource strain: Not on file  . Food insecurity:    Worry: Not on file    Inability: Not on file  . Transportation needs:    Medical: Not on file    Non-medical: Not on file  Tobacco Use  . Smoking status: Never Smoker  . Smokeless tobacco: Former Systems developer    Types: Chew  Substance and Sexual Activity  . Alcohol use: No  . Drug use: No    Comment: used chew when I was a teenager per patient   . Sexual activity: Not on file    Lifestyle  . Physical activity:    Days per week: Not on file    Minutes per session: Not on file  . Stress: Not on file  Relationships  . Social connections:    Talks on phone: Not on file    Gets together: Not on file    Attends religious service: Not on file    Active member of club or organization: Not on file    Attends meetings of clubs or organizations: Not on file    Relationship status: Not on file  . Intimate partner violence:    Fear of current or ex partner: Not on file    Emotionally abused: Not on file    Physically abused: Not on file    Forced sexual activity: Not on file  Other Topics Concern  . Not on file  Social History Narrative   Married.  Lives with wife.  Ambulates independently.  Has a dog names is named Programmer, applications  .   Retired - worked for [employer] paper   No Sport and exercise psychologist Pulmonary:   No known bird, mold, or hot tub exposure.   Family History  Problem Relation Age of Onset  . Diabetes Mellitus II Paternal Grandmother   . Heart disease Neg Hx        He does not know his father's history.   . Cancer Neg Hx   . Diabetes Neg Hx   . CAD Neg Hx   . Lung disease Neg Hx   . Rheumatologic disease Neg Hx     VITAL SIGNS BP (!) 161/45   Pulse 86   Temp 98.3 F (36.8 C)   Resp 18   Ht 5\' 7"  (1.702 m)   Wt 191 lb 3.2 oz (86.7 kg)   SpO2 99%   BMI 29.95 kg/m   Patient's Medications  New Prescriptions   No medications on file  Previous Medications   ACETAMINOPHEN (TYLENOL) 325 MG TABLET    Take 650 mg by mouth every 6 (six) hours as needed.   ALBUTEROL (PROVENTIL) (2.5 MG/3ML) 0.083% NEBULIZER SOLUTION    Take 2.5 mg by nebulization 4 (four) times daily as needed for wheezing or shortness of breath.   ATORVASTATIN (LIPITOR) 40 MG TABLET    Take 40 mg by mouth every evening.    BISACODYL (DULCOLAX) 10 MG SUPPOSITORY    Place 1 suppository (10 mg total) rectally daily as needed for moderate constipation.   CHOLECALCIFEROL  (VITAMIN D-3) 25 MCG (1000 UT) CAPS    Take 1 capsule (1,000 Units total) by mouth daily.   DOCUSATE SODIUM (COLACE) 100 MG CAPSULE  Take 1 capsule (100 mg total) by mouth 2 (two) times daily.   DOXYCYCLINE (DORYX) 100 MG EC TABLET    Take 100 mg by mouth 2 (two) times daily.   FEBUXOSTAT (ULORIC) 40 MG TABLET    Take 40 mg by mouth daily.   FUROSEMIDE (LASIX) 40 MG TABLET    Take 1 tablet (40 mg total) by mouth daily.   HYDRALAZINE (APRESOLINE) 25 MG TABLET    Take 1 tablet (25 mg total) by mouth every 8 (eight) hours.   HYDROCORTISONE CREAM 1 %    Apply topically 2 (two) times daily. Apply to itchy areas on the left arm and chest   INSULIN ASPART (NOVOLOG FLEXPEN) 100 UNIT/ML FLEXPEN    Inject 8 Units into the skin 3 (three) times daily with meals.   INSULIN DETEMIR (LEVEMIR FLEXTOUCH) 100 UNIT/ML PEN    Inject 35 Units into the skin at bedtime.   ISOSORBIDE MONONITRATE (IMDUR) 60 MG 24 HR TABLET    Take 1 tablet (60 mg total) by mouth daily.   METOPROLOL (LOPRESSOR) 50 MG TABLET    Take 50 mg by mouth 2 (two) times daily.   NON FORMULARY    Diet Type:  NAS, NCS   OMEPRAZOLE (PRILOSEC) 20 MG CAPSULE    Take 20 mg by mouth daily.    OXYCODONE-ACETAMINOPHEN (PERCOCET/ROXICET) 5-325 MG TABLET    Take 1 tablet by mouth every 6 (six) hours as needed for severe pain.   OXYGEN    Inhale 3 L/min into the lungs continuous.   POLYETHYLENE GLYCOL (MIRALAX / GLYCOLAX) PACKET    Take 17 g by mouth daily as needed for mild constipation.   PREDNISONE (DELTASONE) 20 MG TABLET    Take 20 mg by mouth daily with breakfast.   PROBIOTIC PRODUCT (RISA-BID PROBIOTIC) TABS    Take 1 tablet by mouth 2 (two) times daily.   SODIUM CHLORIDE (OCEAN) 0.65 % SOLN NASAL SPRAY    Place 1 spray into both nostrils as needed for congestion.   VITAMIN B-12 (CYANOCOBALAMIN) 1000 MCG TABLET    Take 1 tablet (1,000 mcg total) by mouth daily.  Modified Medications   No medications on file  Discontinued Medications   No  medications on file     SIGNIFICANT DIAGNOSTIC EXAMS   PREVIOUS:   05-13-18: ct of head:  1. Acute, left greater than right subdural hematomas measuring up to10 mm in thickness on the left overlying the left frontal, temporaland parietal lobes and up to 3 mm overlying the right temporal lobe. No midline shift  05-13-18: ct of left shoulder:  Neer category 2 part fracture of the proximal left humerus with one shaft width anterior displacement of the humeral shaft relative to the humeral head. Nondisplaced fracture undermining the greater tuberosity is identified. No joint dislocation is seen.  05-14-18: ct of head:  1. Decreased size of bilateral acute subdural hematomas measuring to 6 mm on the LEFT. No midline shift. 2. Image findings of chronic communicating hydrocephalus.  05-19-18: left shoulder x-ray: Interval left shoulder replacement for proximal humerus fracture with expected postsurgical changes  05-23-18: chest x-ray: 1. Diminished aeration to left base compared with prior exam. 2. Suspect small right pleural effusion.  05-25-18: 2-d echo: - Left ventricle: The cavity size was normal. Systolic function was vigorous. The estimated ejection fraction was in the range of 65% to 70%. Wall motion was normal; there were no regional wall motion abnormalities. Doppler parameters are consistent with abnormal left ventricular  relaxation (grade 1 diastolic dysfunction). - Aortic valve: Trileaflet; mildly thickened, mildly calcified leaflets.  06-19-18: chest x-ray: pulmonary infiltrate left lung base small left pleural effusion consistent with pneumonia/chf.    NO NEW EXAMS  LABS REVIEWED: PREVIOUS;   05-13-18: wbc 19.0; hgb 14.6; hct 46.5; mcv 96.1 plt 243 glucose 186; bun 31; creat 1.66; k+ 5.5; na++ 140; ca 9.6 liver normal albumin 3.4 blood culture: no growth  05-15-18; wbc 15.5; hgb 11.3; hct 36.4; mcv 97.6 plt 199 glucose 194; bun 50; creat 2.90; k+ 4.9; na++ 138  05-18-18:  glucose 115; bun 33; creat 1.44; k+ 4.3; na++ 136; ca 8.2  05-27-18: wbc 13.1; hgb 10.5; hct 33.3; mcv 93.;8 plt 327;glucose 86; bun 39; creat 1.93; k+ 3.8; na++ 136; ca 8.8  06-02-18: uric acid: 10.1  NO NEW LABS.    Review of Systems  Constitutional: Negative for malaise/fatigue.  Respiratory: Negative for cough and shortness of breath.   Cardiovascular: Negative for chest pain, palpitations and leg swelling.  Gastrointestinal: Negative for abdominal pain, constipation and heartburn.  Musculoskeletal: Negative for back pain, joint pain and myalgias.  Skin: Negative.   Neurological: Negative for dizziness.  Psychiatric/Behavioral: The patient is not nervous/anxious.     Physical Exam Constitutional:      General: He is not in acute distress.    Appearance: He is well-developed. He is obese. He is not diaphoretic.  Neck:     Musculoskeletal: Neck supple.     Thyroid: No thyromegaly.  Cardiovascular:     Rate and Rhythm: Normal rate and regular rhythm.     Pulses: Normal pulses.     Heart sounds: Normal heart sounds.  Pulmonary:     Effort: Pulmonary effort is normal. No respiratory distress.     Comments: 02 dependent breath sounds diminished at bases  Abdominal:     General: Bowel sounds are normal. There is no distension.     Palpations: Abdomen is soft.     Tenderness: There is no abdominal tenderness.  Musculoskeletal:     Right lower leg: No edema.     Left lower leg: No edema.     Comments: Is able to move all extremities   Lymphadenopathy:     Cervical: No cervical adenopathy.  Skin:    General: Skin is warm and dry.  Neurological:     Mental Status: He is alert and oriented to person, place, and time.  Psychiatric:        Mood and Affect: Mood normal.      ASSESSMENT/ PLAN:   Patient is being discharged with the following home health services:  None needed   Patient is being discharged with the following durable medical equipment:  None needed   Patient  has been advised to f/u with their PCP in 1-2 weeks to bring them up to date on their rehab stay.  Social services at facility was responsible for arranging this appointment.  Pt was provided with a 30 day supply of prescriptions for medications and refills must be obtained from their PCP.  For controlled substances, a more limited supply may be provided adequate until PCP appointment only.   All medications will be provided by the receiving facility.    Ok Edwards NP [facility]  Contact [phone] Monday through Friday 8am- 5pm  After hours call [phone]

## 2018-06-23 ENCOUNTER — Ambulatory Visit: Payer: Medicare Other | Admitting: Podiatry

## 2018-06-23 ENCOUNTER — Non-Acute Institutional Stay: Payer: Medicare Other | Admitting: Nurse Practitioner

## 2018-06-23 DIAGNOSIS — R279 Unspecified lack of coordination: Secondary | ICD-10-CM | POA: Diagnosis not present

## 2018-06-23 DIAGNOSIS — I130 Hypertensive heart and chronic kidney disease with heart failure and stage 1 through stage 4 chronic kidney disease, or unspecified chronic kidney disease: Secondary | ICD-10-CM | POA: Diagnosis not present

## 2018-06-23 DIAGNOSIS — M2550 Pain in unspecified joint: Secondary | ICD-10-CM | POA: Diagnosis not present

## 2018-06-23 DIAGNOSIS — I480 Paroxysmal atrial fibrillation: Secondary | ICD-10-CM | POA: Diagnosis not present

## 2018-06-23 DIAGNOSIS — I10 Essential (primary) hypertension: Secondary | ICD-10-CM | POA: Diagnosis not present

## 2018-06-23 DIAGNOSIS — R2689 Other abnormalities of gait and mobility: Secondary | ICD-10-CM | POA: Diagnosis not present

## 2018-06-23 DIAGNOSIS — I5032 Chronic diastolic (congestive) heart failure: Secondary | ICD-10-CM | POA: Diagnosis not present

## 2018-06-23 DIAGNOSIS — M6281 Muscle weakness (generalized): Secondary | ICD-10-CM | POA: Diagnosis not present

## 2018-06-23 DIAGNOSIS — Z7401 Bed confinement status: Secondary | ICD-10-CM | POA: Diagnosis not present

## 2018-06-23 DIAGNOSIS — R262 Difficulty in walking, not elsewhere classified: Secondary | ICD-10-CM | POA: Diagnosis not present

## 2018-06-23 DIAGNOSIS — I959 Hypotension, unspecified: Secondary | ICD-10-CM | POA: Diagnosis not present

## 2018-06-24 ENCOUNTER — Ambulatory Visit
Admission: RE | Admit: 2018-06-24 | Discharge: 2018-06-24 | Disposition: A | Discharge status: Home / Self Care | Payer: Medicare Other | Source: Ambulatory Visit | Attending: Neurological Surgery | Admitting: Neurological Surgery

## 2018-06-24 DIAGNOSIS — S065XAA Traumatic subdural hemorrhage with loss of consciousness status unknown, initial encounter: Secondary | ICD-10-CM

## 2018-06-24 DIAGNOSIS — S065X9A Traumatic subdural hemorrhage with loss of consciousness of unspecified duration, initial encounter: Secondary | ICD-10-CM

## 2018-06-24 DIAGNOSIS — I6200 Nontraumatic subdural hemorrhage, unspecified: Secondary | ICD-10-CM | POA: Diagnosis not present

## 2018-06-25 ENCOUNTER — Non-Acute Institutional Stay: Payer: Medicare Other | Admitting: Nurse Practitioner

## 2018-06-26 ENCOUNTER — Encounter: Payer: Self-pay | Admitting: Nurse Practitioner

## 2018-06-26 ENCOUNTER — Non-Acute Institutional Stay: Payer: Medicare Other | Admitting: Nurse Practitioner

## 2018-06-26 VITALS — HR 88 | Temp 98.0°F | Resp 18

## 2018-06-26 DIAGNOSIS — Z515 Encounter for palliative care: Secondary | ICD-10-CM | POA: Insufficient documentation

## 2018-06-26 DIAGNOSIS — R531 Weakness: Secondary | ICD-10-CM | POA: Insufficient documentation

## 2018-06-26 DIAGNOSIS — R0602 Shortness of breath: Secondary | ICD-10-CM

## 2018-06-26 NOTE — Progress Notes (Signed)
[place] Consult Note Telephone: [phone]  Fax: [phone]  PATIENT NAME: Justin Leach DOB: 08/03/1931 MRN: [id number]  PRIMARY CARE PROVIDER:   Lavone Orn, MD  REFERRING PROVIDER:  Dr Justin Leach [facility] RESPONSIBLE PARTY:     RECOMMENDATIONS and PLAN:  1. Palliative care encounter Z51.5; Palliative medicine team will continue to support patient, patient's family, and medical team. Visit consisted of counseling and education dealing with the complex and emotionally intense issues of symptom management and palliative care in the setting of serious and potentially life-threatening illness  2. Dyspneic R06.00 secondary to pulmonary fibrosis; CHF improving at present time. Continue daily weights   3. Generalized weakness R53.1  secondary to CHF continue with therapy as able. Encourage energy conservation and rest times.  ASSESSMENT:     I visited and observed Justin Leach. We talked about purpose for palliative care visit and he was agreeable. We talked about when he was living independently at home prior to hospitalization. He talked about his recent following hospitalization. He's verbalize that he almost died and if it wasn't for his cell phone he would have. We talked about symptoms of paying currently and he shared that he is comfortable. We talked about his appetite which has been poor. He talked about the food not being so good he had eggs for breakfast. We talked about his progress with therapy which has been very slow. He is hopeful he will walk again. He Leach it's going to be one day at a time. We talked about medical goals including aggressive versus conservative vs Comfort Care. He currently is a full code. He was kind of have been able to answer questions appropriately. Justin Leach Leach he would not want to have CPR nor be placed on a ventilator. Justin Leach and I talked about his health care power of  attorney and being Justin Leach his nephew. Discuss that will contact Justin Leach for further discussion of medical goals prior to changing to a DNR. 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 We talked about his wife who he is been married to for over 83 years. He talked about not having children and his concern over his wife's Health. She is currently living with her sister in [place] as she is having more problems with her memory and he is unable to care for her at home. We talked about role of palliative care and plan of care. Discuss will follow up in 1 week or so there and he is unable to care for her at home. We talked about role of palliative care and plan of care. Therapeutic listening and emotional support provided. We talked about follow up visit in 1 week or sooner if declined for further discussion of medical goals of care.  I called Justin Leach, Justin Leach power of attorney nephew. Talked about purpose for palliative care visit. We talked about last time he was living at home independently. Justin Leach he was having some difficulty with memory. We talked about [employer] was his wife residing with her sister. We talked about past medical history in the setting of chronic disease. We talked about recent fall and hospitalization. We talked about discharge to [facility] for short-term rehab and now has transition to [facility] to complete. We talked about his functional abilities with short-term rehab. We talked about possible discharge home with his sister-in-law where his wife lives. We talked about functional level  and we'll just have to see how he does if he's able to ambulate. If he's not able to ambulate many to look at transition to long-term care. We talked about taking one day at a time. We talked about symptoms, appetite. We talked about medical goals of care including aggressive versus conservative versus comfort care. We talked about  discussion with Justin Leach about wishes to be a DNR. Justin Leach one minute he wants to be a DNR and then when asked if he wants to be kept alive he will say yes. Explained at length about DNR and full code status with CPR. 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 Asked if it was possible to set up a family meeting with Justin Leach, Justin Leach and palliative care for further discussion and clarification of code status. Justin Leach and agreement. Ask Justin Leach if his wishes are to have Justin Leach his wife and sister in law present if he feels like it would be helpful. Well set for Tuesday, February at 2:30 at [facility]. We talked about role of palliative care and plan of care. Therapeutic listening and emotional support provided. Contact information provided. Questions answered to satisfaction.  1 / 6 / 2020 Echo EF 65 to 70%  1 / 8 / 2,020 sodium 136, potassium 3.8, chloride 92, calcium 8.8, bun 39, creatinine 1.93, glucose 86, WBC 13.1, hemoglobin 10.5, hematocrit 33.3, platelets 327  I spent 90 minutes providing this consultation,  from 10:15am to 11:45am. More than 50% of the time in this consultation was spent coordinating communication.   HISTORY OF PRESENT ILLNESS:  Justin Leach is a 85 y.o. year old male with multiple medical problems including subdural hematoma, Atrial fibrillation, pulmonary fibrosis, hypertension, coronary artery calcification seen on CT scan, chronic diastolic congestive heart failure, diabetes, chronic kidney disease, history of bradycardia, doubt, cervical disc disease, dislipidemia, bilateral rotator cuff repairs, left reverse shoulder arthroplasty, cervical spine posterior Fusion, knee arthroscopy, eye surgery, cholecystectomy, cataract extraction with intraocular lens implant. Hospitalize 12 / 25 / 2019 to 1/8 / 2020 for subdural hematoma after fall at home where he lives by himself independently with injury to  left shoulder, facial laceration. Workout significant for bilateral traumatic subdural hematoma reversed in emergency department, left humeral surgical neck fracture seen by orthopedic and Neurosurgery. He did experience delirium during hospitalization and found to have mass volume overload placed on aggressive IV diuresing. 12 / 25 / 2020 CT showing acute left greater than right subdural hematomas measuring up to 10 mm in thickness on the leftover laying the left frontal temporal parietal lobes and 3 mm overlying the right temporal lobe. No midline shift. He was on coming in on the admission which was reversed. Neurosurgery consulted and felt like follow up in three weeks with CT. Repeat scan on 12/26 show decrease in size of bilateral acute subdural hematomas measuring up to 6 mm on left with no shift. He did undergo a reverse shoulder arthroplasty left on 12/31/ 2019. He did receive Tylenol for mild pain and Percocet for moderate-to-severe pain. Acute kidney injury superimposed on chronic kidney disease stage 3. Proximal atrial fibrillation with controlled rate though no continued Coumadin. 1 / 4 / 2020  chest x-ray did reveal suspected right small pleural effusion  with repeat chest x-ray on 1/30 05/2018 with pulmonary infiltrate left lung base with small left pleural effusion consistent with pneumonia, congestive heart failure. He was just charged to [facility]  place for short-term rehab with discharged in on 2 / 3 / 2020 for [place] care. He currently resides at [facility]. He has been slow to Progressive Therapy. He has been able to sit up on the side of the bed and transfer the non-ambulatory. He is ADL dependent. He is able to feed himself and appetite has remained poor. He does for glasses needs those staff Leach at times he becomes a little confused. At present he is lying in bed. He appears comfortable. No visitors present. Palliative Care was asked to help address goals of care.    CODE STATUS: full  PPS: 40% HOSPICE ELIGIBILITY/DIAGNOSIS: TBD  PAST MEDICAL HISTORY:  Past Medical History:  Diagnosis Date  . Bradycardia    a. during 02/2015 admission - occasional HR in 40s while being treated for atrial fib.  . Cervical disc disease   . Chronic diastolic CHF (congestive heart failure) ([place])    a. Dx 02/2015 -  2D echo 03/02/15 showed severe focal basal hypertrophy of the septum, EF 55-60%, mild Justin, no effusion.  . CKD (chronic kidney disease), stage III ([place])   . Coronary artery calcification seen on CT scan    a. Nuc 02/2015 was normal.  . Dyslipidemia   . GERD (gastroesophageal reflux disease)   . Hypertension   . 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 PAF (paroxysmal atrial fibrillation) ([place])    a. Dx 02/2015 - in and out on tele, rx'd Coumadin and amiodarone.  . Pneumonia 2007  . Shortness of breath dyspnea    WITH EXERTION  . Type II diabetes mellitus ([place])     SOCIAL HX:  Social History   Tobacco Use  . Smoking status: Never Smoker  . Smokeless tobacco: Former Systems developer    Types: Chew  Substance Use Topics  . Alcohol use: No    ALLERGIES:  Allergies  Allergen Reactions  . 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 Patient subsequently taken off amiodarone.  . Nifedipine Other (See Comments)    Reaction: made gums swell up. Pt states that he had to have gum surgery after taking.      PERTINENT MEDICATIONS:  Outpatient Encounter Medications as of 06/26/2018  Medication Sig  . acetaminophen (TYLENOL) 325 MG tablet Take 650 mg by mouth every 6 (six) hours as needed.  Marland Kitchen albuterol (PROVENTIL) (2.5 MG/3ML) 0.083% nebulizer solution Take 2.5 mg by nebulization 4 (four) times daily as needed for wheezing or shortness of breath.  Marland Kitchen atorvastatin (LIPITOR) 40 MG tablet Take 40 mg by mouth every evening.    . bisacodyl (DULCOLAX) 10 MG suppository Place 1 suppository (10 mg total) rectally daily as needed for moderate constipation.  . Cholecalciferol (VITAMIN D-3) 25 MCG (1000 UT) CAPS Take 1 capsule (1,000 Units total) by mouth daily.  Marland Kitchen docusate sodium (COLACE) 100 MG capsule Take 1 capsule (100 mg total) by mouth 2 (two) times daily.  Marland Kitchen doxycycline (DORYX) 100 MG EC tablet Take 100 mg by mouth 2 (two) times daily.  . febuxostat (ULORIC) 40 MG tablet Take 40 mg by mouth daily.  . furosemide (LASIX) 40 MG tablet Take 1 tablet (40 mg total) by mouth daily.  . hydrALAZINE (APRESOLINE) 25 MG tablet Take 1 tablet (25 mg total) by mouth every 8 (eight) hours.  . hydrocortisone cream 1 % Apply  topically 2 (two) times daily. Apply to itchy areas on the left arm and chest  . insulin aspart (NOVOLOG FLEXPEN) 100 UNIT/ML FlexPen Inject 8 Units into the skin 3 (three) times daily with meals.  . Insulin Detemir (LEVEMIR FLEXTOUCH) 100 UNIT/ML Pen Inject 35 Units into the skin at bedtime.  . isosorbide mononitrate (IMDUR) 60 MG 24 hr tablet Take 1 tablet (60 mg total) by mouth daily.  . metoprolol (LOPRESSOR) 50 MG tablet Take 50 mg by mouth 2 (two) times daily.  . NON FORMULARY Diet Type:  NAS, NCS  . omeprazole (PRILOSEC) 20 MG capsule Take 20 mg by mouth daily.   . OXYGEN Inhale 3 L/min into the lungs continuous.  . polyethylene glycol (MIRALAX / GLYCOLAX) packet Take 17 g by mouth daily as needed for mild constipation.  . Probiotic Product (RISA-BID PROBIOTIC) TABS Take 1 tablet by mouth 2 (two) times daily.  . sodium chloride (OCEAN) 0.65 % SOLN nasal spray Place 1 spray into both nostrils as needed for congestion.  . vitamin B-12 (CYANOCOBALAMIN) 1000 MCG tablet Take 1 tablet (1,000 mcg total) by mouth daily.   No facility-administered encounter medications on file as of 06/26/2018.     PHYSICAL EXAM:   General: chronically ill, debilitated pleasant male Cardiovascular: regular rate and  rhythm Pulmonary: clear ant fields Abdomen: soft, nontender, + bowel sounds GU: no suprapubic tenderness Extremities: + edema, no joint deformities Skin: no rashes Neurological: Weakness but otherwise nonfocal  Jaylee Lantry Ihor Gully, NP

## 2018-06-29 ENCOUNTER — Telehealth: Payer: Self-pay | Admitting: Pulmonary Disease

## 2018-06-29 NOTE — Telephone Encounter (Signed)
Called and spoke with Arbie Cookey from [facility], she stated that Justin Leach has left for the day. Arbie Cookey tried to transfer me to someone else who could help but she stated they would not pick up the phone. Arbie Cookey has left a message for Justin Leach to give [place] a call back tomorrow.

## 2018-06-30 ENCOUNTER — Encounter: Payer: Self-pay | Admitting: Nurse Practitioner

## 2018-06-30 ENCOUNTER — Non-Acute Institutional Stay: Payer: Medicare Other | Admitting: Nurse Practitioner

## 2018-06-30 VITALS — HR 82 | Temp 98.0°F | Resp 18

## 2018-06-30 DIAGNOSIS — Z515 Encounter for palliative care: Secondary | ICD-10-CM

## 2018-06-30 DIAGNOSIS — R0602 Shortness of breath: Secondary | ICD-10-CM

## 2018-06-30 DIAGNOSIS — R531 Weakness: Secondary | ICD-10-CM

## 2018-06-30 NOTE — Progress Notes (Signed)
[place] Consult Note Telephone: [phone]  Fax: [phone]  PATIENT NAME: Justin Leach DOB: [birth year] MRN: [id number]  PRIMARY CARE PROVIDER:   Lavone Orn, MD  REFERRING PROVIDER:  Dr Slade-Hermann/[facility] RESPONSIBLE PARTY:   Darcella Cheshire Nephew  RECOMMENDATIONS and PLAN:  1. Palliative care encounter Z51.5; Palliative medicine team will continue to support patient, patient's family, and medical team. Visit consisted of counseling and education dealing with the complex and emotionally intense issues of symptom management and palliative care in the setting of serious and potentially life-threatening illness  2. Dyspneic R06.00 secondary to pulmonary fibrosis; CHF improving at present time. Continue daily weights   3. Generalized weaknessR53.1  secondary to CHF continue with therapy as able. Encourage energy conservation and rest times.  ASSESSMENT:     I visited and observed Mr. Halteman. We talked about purpose for palliative care visit. We talked about how he was feeling. He verbalize that he was sleepy. Asked if he was having symptoms of shortness of breath or pain and he replied no. He was very limited with his answers to one word as he drifted back to sleep in between questions during palliative care visit. He does remain on O2 and appears chronically ill. Asked if he was working with therapy and he did not give an answer. We talked about his breakfast for which he didn't eat but a few bites. We talked about upcoming family meeting today, he replied he was not aware but he did seem confused. Talked about role of palliative care and plan of care. Limited verbal discussion with confusion. Emotional support provided.  I met separately with Darcella Cheshire, Mr. Carton nephew. We talked about purpose for palliative care visit. We talked about the last time Mr Puccinelli was at home independently. We reviewed phone  discussion. We talked about his cognitive and functional abilities. Darcella Cheshire at length about family history with Life review. He talked about the relationship between Mr. Cassaday his wife. We talked about discharge plans pertaining to want short-term rehab is completed. Juanda Crumble endorses it depends on if he is able to transfer himself he may be able to go to his sister-in-law's where his wife resides. If he's unable to transfer himself will then possibly need to look for long-term placement. We talked about concern for high risk of rehospitalization. We talked about symptoms, appetite. We talked about decision making. We talked about medical goals of care including aggressive versus conservative versus Comfort Care. We talked about code status says he does remaining full code.  Mr. Mickeal, Daws and I further discussed medical goals of care including aggressive versus conservative versus Comfort Care. Mr. Enfield endorses he wants to treat what is treatable. We talked about code status and he sure that he wanted a natural death. We talked about scenarios with CPR and he shared that he did not want CPR. He was clearing able to answer questions appropriately, oriented. [name] in agreement with DNR. At a facility form completed in order written at [facility] given to staff. Talked about [place] palliative care and plan of care. Therapeutic listening an emotional support provided. Questions answered to satisfaction.  I spent 120 minutes providing this consultation,  from 2:15pm to 4:15pm. More than 50% of the time in this consultation was spent coordinating communication.   HISTORY OF PRESENT ILLNESS:  MICKEAL DAWS is a 85 y.o. year old male with multiple medical problems including Mr. Pusey continues to  reside at short-term rehab at [facility]. He does remain bed bound, assistance for transferring including a lift at times. He is total ADL dependence. Her staff he prefers to urinate and  have bowel movements in a diaper in the bed. He does feed himself with assistance after tray setup. Appetite has remained poor. He has continued to require continuous oxygen. Staff endorses he has intermit confusion, some days are more clear than others. He is a full code and palliative care to meet with family today, Darcella Cheshire, his health care power-of-attorney nephew just leather discussion of medical goals of care, discharge planning and code status. At present Mr. Summerlin is lying in bed, he appears chronically ill, 02 dependent, sleepy. No visitors present. Palliative Care was asked to help address goals of care.   CODE STATUS: DNR  PPS: 30% HOSPICE ELIGIBILITY/DIAGNOSIS: TBD  PAST MEDICAL HISTORY:  Past Medical History:  Diagnosis Date  . Bradycardia    a. during 02/2015 admission - occasional HR in 40s while being treated for atrial fib.  . Cervical disc disease   . Chronic diastolic CHF (congestive heart failure) ([place])    a. Dx 02/2015 -  2D echo 03/02/15 showed severe focal basal hypertrophy of the septum, EF 55-60%, mild MR, no effusion.  . CKD (chronic kidney disease), stage III ([place])   . Coronary artery calcification seen on CT scan    a. Nuc 02/2015 was normal.  . Dyslipidemia   . GERD (gastroesophageal reflux disease)   . Hypertension   . 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 PAF (paroxysmal atrial fibrillation) ([place])    a. Dx 02/2015 - in and out on tele, rx'd Coumadin and amiodarone.  . Pneumonia 2007  . Shortness of breath dyspnea    WITH EXERTION  . Type II diabetes mellitus ([place])     SOCIAL HX:  Social History   Tobacco Use  . Smoking status: Never Smoker  . Smokeless tobacco: Former Systems developer    Types: Chew  Substance Use Topics  . Alcohol use: No    ALLERGIES:  Allergies  Allergen Reactions  . Amiodarone Other (See Comments)    MD noted 10/31/15: Chest imaging in January showed new findings concerning for  amiodarone toxicity versus NSIP. Patient subsequently taken off amiodarone.  . Nifedipine Other (See Comments)    Reaction: made gums swell up. Pt states that he had to have gum surgery after taking.      PERTINENT MEDICATIONS:  Outpatient Encounter Medications as of 06/30/2018  Medication Sig  . acetaminophen (TYLENOL) 325 MG tablet Take 650 mg by mouth every 6 (six) hours as needed.  Marland Kitchen albuterol (PROVENTIL) (2.5 MG/3ML) 0.083% nebulizer solution Take 2.5 mg by nebulization 4 (four) times daily as needed for wheezing or shortness of breath.  Marland Kitchen atorvastatin (LIPITOR) 40 MG tablet Take 40 mg by mouth every evening.   . bisacodyl (DULCOLAX) 10 MG suppository Place 1 suppository (10 mg total) rectally daily as needed for moderate constipation.  . Cholecalciferol (VITAMIN D-3) 25 MCG (1000 UT) CAPS Take 1 capsule (1,000 Units total) by mouth daily.  Marland Kitchen docusate sodium (COLACE) 100 MG capsule Take 1 capsule (100 mg total) by mouth 2 (two) times daily.  . febuxostat (ULORIC) 40 MG tablet Take 40 mg by mouth daily.  . furosemide (LASIX) 40 MG tablet Take 1 tablet (40 mg total) by mouth daily.  . hydrALAZINE (APRESOLINE) 25 MG tablet Take 1  tablet (25 mg total) by mouth every 8 (eight) hours.  . hydrocortisone cream 1 % Apply topically 2 (two) times daily. Apply to itchy areas on the left arm and chest  . insulin aspart (NOVOLOG FLEXPEN) 100 UNIT/ML FlexPen Inject 8 Units into the skin 3 (three) times daily with meals.  . Insulin Detemir (LEVEMIR FLEXTOUCH) 100 UNIT/ML Pen Inject 35 Units into the skin at bedtime.  . isosorbide mononitrate (IMDUR) 60 MG 24 hr tablet Take 1 tablet (60 mg total) by mouth daily.  . metoprolol (LOPRESSOR) 50 MG tablet Take 50 mg by mouth 2 (two) times daily.  . NON FORMULARY Diet Type:  NAS, NCS  . omeprazole (PRILOSEC) 20 MG capsule Take 20 mg by mouth daily.   . OXYGEN Inhale 3 L/min into the lungs continuous.  . polyethylene glycol (MIRALAX / GLYCOLAX) packet Take 17 g  by mouth daily as needed for mild constipation.  . Probiotic Product (RISA-BID PROBIOTIC) TABS Take 1 tablet by mouth 2 (two) times daily.  . sodium chloride (OCEAN) 0.65 % SOLN nasal spray Place 1 spray into both nostrils as needed for congestion.  . vitamin B-12 (CYANOCOBALAMIN) 1000 MCG tablet Take 1 tablet (1,000 mcg total) by mouth daily.   No facility-administered encounter medications on file as of 06/30/2018.     PHYSICAL EXAM:   General: chronically ill, weak, O2 dependent, pleasant male Cardiovascular: regular rate and rhythm Pulmonary: clear ant fields Abdomen: soft, nontender, + bowel sounds GU: no suprapubic tenderness Extremities: mild BLE edema, no joint deformities Skin: no rashes Neurological: Weakness but otherwise nonfocal/non-ambulatory  Mayah Urquidi Ihor Gully, NP

## 2018-07-01 ENCOUNTER — Other Ambulatory Visit: Payer: Self-pay

## 2018-07-01 DIAGNOSIS — J849 Interstitial pulmonary disease, unspecified: Secondary | ICD-10-CM

## 2018-07-01 NOTE — Telephone Encounter (Signed)
Sched pt for CT on 2/27 at 2:00 at [facility].  Called Gwendel Hanson at [place] gave her appt info.  Nothing further needed.

## 2018-07-01 NOTE — Telephone Encounter (Signed)
Called [facility] & canceled CT that was scheduled for 2/27.  Will have nurse to put in a new order so CT can be scheduled at [facility].

## 2018-07-01 NOTE — Telephone Encounter (Signed)
Spoke with Neoma Laming. Pt is scheduled for a CT on 07/16/2018 at [place]. Neoma Laming is requesting to have this moved to [facility] instead.  [facility] - can you please change this?

## 2018-07-02 DIAGNOSIS — Z4789 Encounter for other orthopedic aftercare: Secondary | ICD-10-CM | POA: Diagnosis not present

## 2018-07-02 DIAGNOSIS — R198 Other specified symptoms and signs involving the digestive system and abdomen: Secondary | ICD-10-CM | POA: Diagnosis not present

## 2018-07-02 DIAGNOSIS — E1122 Type 2 diabetes mellitus with diabetic chronic kidney disease: Secondary | ICD-10-CM | POA: Diagnosis not present

## 2018-07-03 ENCOUNTER — Other Ambulatory Visit: Payer: Self-pay | Admitting: Neurological Surgery

## 2018-07-03 DIAGNOSIS — S065XAA Traumatic subdural hemorrhage with loss of consciousness status unknown, initial encounter: Secondary | ICD-10-CM

## 2018-07-03 DIAGNOSIS — S0003XA Contusion of scalp, initial encounter: Secondary | ICD-10-CM

## 2018-07-03 DIAGNOSIS — S065X9A Traumatic subdural hemorrhage with loss of consciousness of unspecified duration, initial encounter: Secondary | ICD-10-CM

## 2018-07-09 DIAGNOSIS — M1049 Other secondary gout, multiple sites: Secondary | ICD-10-CM | POA: Diagnosis not present

## 2018-07-09 DIAGNOSIS — I5032 Chronic diastolic (congestive) heart failure: Secondary | ICD-10-CM | POA: Diagnosis not present

## 2018-07-10 DIAGNOSIS — S065X0D Traumatic subdural hemorrhage without loss of consciousness, subsequent encounter: Secondary | ICD-10-CM | POA: Diagnosis not present

## 2018-07-10 DIAGNOSIS — Z96612 Presence of left artificial shoulder joint: Secondary | ICD-10-CM | POA: Diagnosis not present

## 2018-07-10 DIAGNOSIS — E1122 Type 2 diabetes mellitus with diabetic chronic kidney disease: Secondary | ICD-10-CM | POA: Diagnosis not present

## 2018-07-10 DIAGNOSIS — S42212D Unspecified displaced fracture of surgical neck of left humerus, subsequent encounter for fracture with routine healing: Secondary | ICD-10-CM | POA: Diagnosis not present

## 2018-07-13 ENCOUNTER — Encounter: Payer: Self-pay | Admitting: Nurse Practitioner

## 2018-07-13 ENCOUNTER — Non-Acute Institutional Stay: Payer: Medicare Other | Admitting: Nurse Practitioner

## 2018-07-13 VITALS — HR 82 | Temp 98.0°F | Resp 18 | Wt 189.6 lb

## 2018-07-13 DIAGNOSIS — R0602 Shortness of breath: Secondary | ICD-10-CM

## 2018-07-13 DIAGNOSIS — Z515 Encounter for palliative care: Secondary | ICD-10-CM

## 2018-07-13 DIAGNOSIS — R531 Weakness: Secondary | ICD-10-CM

## 2018-07-13 NOTE — Progress Notes (Signed)
[facility] Consult Note Telephone: [phone]  Fax: [phone]  PATIENT NAME: Justin Leach DOB: 12/01/1931 MRN: [id number]  PRIMARY CARE PROVIDER:   Lavone Orn, MD  REFERRING PROVIDER:  Lavone Orn, MD [address] [place], [place] [address]  RESPONSIBLE PARTY:   Justin Leach Nephew  RECOMMENDATIONS and PLAN:  1.Palliative care encounter Z51.5; Palliative medicine team will continue to support patient, patient's family, and medical team. Visit consisted of counseling and education dealing with the complex and emotionally intense issues of symptom management and palliative care in the setting of serious and potentially life-threatening illness  2.Dyspneic R06.00 secondary to pulmonary fibrosis; CHF improvingat present time. Continue daily weights  3.Generalized weaknessR53.1 secondary to CHFcontinue with therapy as able. Encourage energy conservation and rest times.  ASSESSMENT:     I visit and observed Justin Leach. We talked about purpose for palliative care visit. We talked about how his day was. He was complaining about the food at the facility. He talked about not getting meets with his breakfast and bread with his lunches. We talked about his appetite. We talked about healthy diet habits. He wishes for ice cream multiple times a day. We talked about symptoms of pain but she currently denies. We talked about fatigue and weakness. We talked about his therapy and he shared that he feels like he's improving. We talked about his mobility, ambulating with a walker. Praise Justin Leach for walking and continuing to try to strive for improvement. Limited verbal discussion with some intermittent confusion. He does appear to be slowly improving. Justin Leach, his nephew has had recent hip surgery, currently hospitalized as planned. He continues to work with therapy up until his last cover day as he does seem to be very  slowly improving. DNR does remain in place as that was changed last palliative care visit. We talked about role of palliative care and plan of care. Justin Leach she was thankful for palliative care visit in following. Her sister went to the store and was currently not at [facility]. Discussed will recheck in 2 weeks if needed or sooner should he declined. I updated nursing staff.  I spent 70 minutes providing this consultation,  from 2:30pm to 3:40pm. More than 50% of the time in this consultation was spent coordinating communication.   HISTORY OF PRESENT ILLNESS:  Justin Leach is a 85 y.o. year old male with multiple medical problems including subdural hematoma, Atrial fibrillation, pulmonary fibrosis, hypertension, coronary artery calcification seen on CT scan, chronic diastolic congestive heart failure, diabetes, chronic kidney disease, history of bradycardia, doubt, cervical disc disease, dislipidemia, bilateral rotator cuff repairs, left reverse shoulder arthroplasty, cervical spine posterior Fusion, knee arthroscopy, eye surgery, cholecystectomy, cataract extraction with intraocular lens implant. Hospitalize 12 / 25 / 2019 to 1/8 / 2020 for subdural hematoma after fall at home where he lives by himself independently with injury to left shoulder, facial laceration. Workout significant for bilateral traumatic subdural hematoma reversed in emergency department, left humeral surgical neck fracture seen by orthopedic and Neurosurgery. He did experience delirium during hospitalization and found to have mass volume overload placed on aggressive IV diuresing. 12 / 25 / 2020 CT showing acute left greater than right subdural hematomas measuring up to 10 mm in thickness on the leftover laying the left frontal temporal parietal lobes and 3 mm overlying the right temporal lobe. No midline shift. He was on coming in on the admission which was reversed.  Neurosurgery consulted and felt like follow up in three  weeks with CT. Repeat scan on 12/26 show decrease in size of bilateral acute subdural hematomas measuring up to 6 mm on left with no shift. He did undergo a reverse shoulder arthroplasty left on 12/31/ 2019. He did receive Tylenol for mild pain and Percocet for moderate-to-severe pain. Acute kidney injury superimposed on chronic kidney disease stage 3. Proximal atrial fibrillation with controlled rate though no continued Coumadin. 1 / 4 / 2020chest x-ray did reveal suspected right small pleural effusionwith repeat chest x-ray on 1/30 05/2018 with pulmonary infiltrate left lung base with small left pleural effusion consistent with pneumonia, congestive heart failure. He was just charged to [facility] for short-term rehab with discharged in on 2 / 3 / 2020 for [place] care. He currently resides at [facility]. He has been slow to Progressive Therapy. Justin Leach continues to reside in short-term rehab at [facility]. He is now able to stand and walk with his walker few steps at a time. He does remains ADL dependent as his shoulder remains in the sling. He is going to the bathroom now rather than in a diaper. He does feed himself after tray setup. He continues to work with therapy and has been slowly progressing. At present he is sitting in the chair in his room. His wife call Justin Leach is present. Palliative Care was asked to help address goals of care.   CODE STATUS: DNR  PPS: 40% HOSPICE ELIGIBILITY/DIAGNOSIS: TBD  PAST MEDICAL HISTORY:  Past Medical History:  Diagnosis Date  . Bradycardia    a. during 02/2015 admission - occasional HR in 40s while being treated for atrial fib.  . Cervical disc disease   . Chronic diastolic CHF (congestive heart failure) ([place])    a. Dx 02/2015 -  2D echo 03/02/15 showed severe focal basal hypertrophy of the septum, EF 55-60%, mild MR, no effusion.  . CKD (chronic kidney disease), stage III ([place])   . Coronary artery calcification seen on  CT scan    a. Nuc 02/2015 was normal.  . Dyslipidemia   . GERD (gastroesophageal reflux disease)   . Hypertension   . 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 PAF (paroxysmal atrial fibrillation) ([place])    a. Dx 02/2015 - in and out on tele, rx'd Coumadin and amiodarone.  . Pneumonia 2007  . Shortness of breath dyspnea    WITH EXERTION  . Type II diabetes mellitus ([place])     SOCIAL HX:  Social History   Tobacco Use  . Smoking status: Never Smoker  . Smokeless tobacco: Former Systems developer    Types: Chew  Substance Use Topics  . Alcohol use: No    ALLERGIES:  Allergies  Allergen Reactions  . 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 Patient subsequently taken off amiodarone.  . Nifedipine Other (See Comments)    Reaction: made gums swell up. Pt states that he had to have gum surgery after taking.      PERTINENT MEDICATIONS:  Outpatient Encounter Medications as of 07/13/2018  Medication Sig  . acetaminophen (TYLENOL) 325 MG tablet Take 650 mg by mouth every 6 (six) hours as needed.  Marland Kitchen albuterol (PROVENTIL) (2.5 MG/3ML) 0.083% nebulizer solution Take 2.5 mg by nebulization 4 (four) times daily as needed for wheezing or shortness of  breath.  Marland Kitchen atorvastatin (LIPITOR) 40 MG tablet Take 40 mg by mouth every evening.   . bisacodyl (DULCOLAX) 10 MG suppository Place 1 suppository (10 mg total) rectally daily as needed for moderate constipation.  . Cholecalciferol (VITAMIN D-3) 25 MCG (1000 UT) CAPS Take 1 capsule (1,000 Units total) by mouth daily.  Marland Kitchen docusate sodium (COLACE) 100 MG capsule Take 1 capsule (100 mg total) by mouth 2 (two) times daily.  . febuxostat (ULORIC) 40 MG tablet Take 40 mg by mouth daily.  . furosemide (LASIX) 40 MG tablet Take 1 tablet (40 mg total) by mouth daily.  . hydrALAZINE (APRESOLINE) 25 MG tablet Take 1 tablet (25 mg  total) by mouth every 8 (eight) hours.  . hydrocortisone cream 1 % Apply topically 2 (two) times daily. Apply to itchy areas on the left arm and chest  . insulin aspart (NOVOLOG FLEXPEN) 100 UNIT/ML FlexPen Inject 8 Units into the skin 3 (three) times daily with meals.  . Insulin Detemir (LEVEMIR FLEXTOUCH) 100 UNIT/ML Pen Inject 35 Units into the skin at bedtime.  . isosorbide mononitrate (IMDUR) 60 MG 24 hr tablet Take 1 tablet (60 mg total) by mouth daily.  . metoprolol (LOPRESSOR) 50 MG tablet Take 50 mg by mouth 2 (two) times daily.  . NON FORMULARY Diet Type:  NAS, NCS  . omeprazole (PRILOSEC) 20 MG capsule Take 20 mg by mouth daily.   . OXYGEN Inhale 3 L/min into the lungs continuous.  . polyethylene glycol (MIRALAX / GLYCOLAX) packet Take 17 g by mouth daily as needed for mild constipation.  . sodium chloride (OCEAN) 0.65 % SOLN nasal spray Place 1 spray into both nostrils as needed for congestion.  . vitamin B-12 (CYANOCOBALAMIN) 1000 MCG tablet Take 1 tablet (1,000 mcg total) by mouth daily.   No facility-administered encounter medications on file as of 07/13/2018.     PHYSICAL EXAM:   General: chronically ill, weak, pleasant male Cardiovascular: regular rate and rhythm Pulmonary: clear ant fields Abdomen: soft, nontender, + bowel sounds GU: no suprapubic tenderness Extremities: +BLE edema, no joint deformities Skin: no rashes Neurological: Weakness but otherwise nonfocal  Justin Leach Ihor Gully, NP

## 2018-07-16 ENCOUNTER — Ambulatory Visit
Admission: RE | Admit: 2018-07-16 | Discharge: 2018-07-16 | Disposition: A | Discharge status: Home / Self Care | Payer: Medicare Other | Source: Ambulatory Visit | Attending: Pulmonary Disease | Admitting: Pulmonary Disease

## 2018-07-16 ENCOUNTER — Inpatient Hospital Stay: Admission: RE | Admit: 2018-07-16 | Payer: Medicare Other | Source: Ambulatory Visit

## 2018-07-16 DIAGNOSIS — J849 Interstitial pulmonary disease, unspecified: Secondary | ICD-10-CM | POA: Insufficient documentation

## 2018-07-20 ENCOUNTER — Emergency Department: Payer: Medicare Other

## 2018-07-20 ENCOUNTER — Other Ambulatory Visit: Payer: Self-pay

## 2018-07-20 ENCOUNTER — Inpatient Hospital Stay
Admission: EM | Admit: 2018-07-20 | Discharge: 2018-07-23 | DRG: 193 | Disposition: A | Discharge status: Skilled Nursing Facility | Payer: Medicare Other | Attending: Internal Medicine | Admitting: Internal Medicine

## 2018-07-20 DIAGNOSIS — W19XXXD Unspecified fall, subsequent encounter: Secondary | ICD-10-CM | POA: Diagnosis present

## 2018-07-20 DIAGNOSIS — H9190 Unspecified hearing loss, unspecified ear: Secondary | ICD-10-CM | POA: Diagnosis present

## 2018-07-20 DIAGNOSIS — E876 Hypokalemia: Secondary | ICD-10-CM | POA: Diagnosis not present

## 2018-07-20 DIAGNOSIS — I248 Other forms of acute ischemic heart disease: Secondary | ICD-10-CM | POA: Diagnosis present

## 2018-07-20 DIAGNOSIS — E1122 Type 2 diabetes mellitus with diabetic chronic kidney disease: Secondary | ICD-10-CM | POA: Diagnosis present

## 2018-07-20 DIAGNOSIS — E1169 Type 2 diabetes mellitus with other specified complication: Secondary | ICD-10-CM | POA: Diagnosis present

## 2018-07-20 DIAGNOSIS — E1165 Type 2 diabetes mellitus with hyperglycemia: Secondary | ICD-10-CM | POA: Diagnosis not present

## 2018-07-20 DIAGNOSIS — I130 Hypertensive heart and chronic kidney disease with heart failure and stage 1 through stage 4 chronic kidney disease, or unspecified chronic kidney disease: Secondary | ICD-10-CM | POA: Diagnosis not present

## 2018-07-20 DIAGNOSIS — D631 Anemia in chronic kidney disease: Secondary | ICD-10-CM | POA: Diagnosis present

## 2018-07-20 DIAGNOSIS — M10071 Idiopathic gout, right ankle and foot: Secondary | ICD-10-CM | POA: Diagnosis present

## 2018-07-20 DIAGNOSIS — J9601 Acute respiratory failure with hypoxia: Secondary | ICD-10-CM | POA: Diagnosis not present

## 2018-07-20 DIAGNOSIS — S065X9D Traumatic subdural hemorrhage with loss of consciousness of unspecified duration, subsequent encounter: Secondary | ICD-10-CM

## 2018-07-20 DIAGNOSIS — K5909 Other constipation: Secondary | ICD-10-CM | POA: Diagnosis present

## 2018-07-20 DIAGNOSIS — Z833 Family history of diabetes mellitus: Secondary | ICD-10-CM

## 2018-07-20 DIAGNOSIS — I5033 Acute on chronic diastolic (congestive) heart failure: Secondary | ICD-10-CM | POA: Diagnosis present

## 2018-07-20 DIAGNOSIS — Y95 Nosocomial condition: Secondary | ICD-10-CM | POA: Diagnosis present

## 2018-07-20 DIAGNOSIS — Z794 Long term (current) use of insulin: Secondary | ICD-10-CM | POA: Diagnosis not present

## 2018-07-20 DIAGNOSIS — S42212D Unspecified displaced fracture of surgical neck of left humerus, subsequent encounter for fracture with routine healing: Secondary | ICD-10-CM

## 2018-07-20 DIAGNOSIS — R739 Hyperglycemia, unspecified: Secondary | ICD-10-CM

## 2018-07-20 DIAGNOSIS — J189 Pneumonia, unspecified organism: Secondary | ICD-10-CM | POA: Diagnosis present

## 2018-07-20 DIAGNOSIS — Z9981 Dependence on supplemental oxygen: Secondary | ICD-10-CM

## 2018-07-20 DIAGNOSIS — R7989 Other specified abnormal findings of blood chemistry: Secondary | ICD-10-CM | POA: Diagnosis not present

## 2018-07-20 DIAGNOSIS — E785 Hyperlipidemia, unspecified: Secondary | ICD-10-CM | POA: Diagnosis not present

## 2018-07-20 DIAGNOSIS — I214 Non-ST elevation (NSTEMI) myocardial infarction: Secondary | ICD-10-CM

## 2018-07-20 DIAGNOSIS — Z87891 Personal history of nicotine dependence: Secondary | ICD-10-CM

## 2018-07-20 DIAGNOSIS — I480 Paroxysmal atrial fibrillation: Secondary | ICD-10-CM | POA: Diagnosis not present

## 2018-07-20 DIAGNOSIS — J8410 Pulmonary fibrosis, unspecified: Secondary | ICD-10-CM | POA: Diagnosis present

## 2018-07-20 DIAGNOSIS — R058 Other specified cough: Secondary | ICD-10-CM

## 2018-07-20 DIAGNOSIS — R05 Cough: Secondary | ICD-10-CM | POA: Diagnosis not present

## 2018-07-20 DIAGNOSIS — Z79899 Other long term (current) drug therapy: Secondary | ICD-10-CM

## 2018-07-20 DIAGNOSIS — R000 Tachycardia, unspecified: Secondary | ICD-10-CM | POA: Diagnosis not present

## 2018-07-20 DIAGNOSIS — J9621 Acute and chronic respiratory failure with hypoxia: Secondary | ICD-10-CM | POA: Diagnosis present

## 2018-07-20 DIAGNOSIS — Z888 Allergy status to other drugs, medicaments and biological substances status: Secondary | ICD-10-CM

## 2018-07-20 DIAGNOSIS — I10 Essential (primary) hypertension: Secondary | ICD-10-CM | POA: Diagnosis not present

## 2018-07-20 DIAGNOSIS — N183 Chronic kidney disease, stage 3 (moderate): Secondary | ICD-10-CM | POA: Diagnosis present

## 2018-07-20 DIAGNOSIS — K219 Gastro-esophageal reflux disease without esophagitis: Secondary | ICD-10-CM | POA: Diagnosis present

## 2018-07-20 DIAGNOSIS — R079 Chest pain, unspecified: Secondary | ICD-10-CM | POA: Diagnosis not present

## 2018-07-20 LAB — CBC
HCT: 34.7 % — ABNORMAL LOW (ref 39.0–52.0)
HCT: 33.2 % — ABNORMAL LOW (ref 39.0–52.0)
Hemoglobin: 10.8 g/dL — ABNORMAL LOW (ref 13.0–17.0)
Hemoglobin: 10.4 g/dL — ABNORMAL LOW (ref 13.0–17.0)
MCH: 29.4 pg (ref 26.0–34.0)
MCH: 29.5 pg (ref 26.0–34.0)
MCHC: 31.1 g/dL (ref 30.0–36.0)
MCHC: 31.3 g/dL (ref 30.0–36.0)
MCV: 94.6 fL (ref 80.0–100.0)
MCV: 94.1 fL (ref 80.0–100.0)
NRBC: 0 % (ref 0.0–0.2)
Platelets: 177 10*3/uL (ref 150–400)
Platelets: 149 10*3/uL — ABNORMAL LOW (ref 150–400)
RBC: 3.67 MIL/uL — ABNORMAL LOW (ref 4.22–5.81)
RBC: 3.53 MIL/uL — AB (ref 4.22–5.81)
RDW: 15 % (ref 11.5–15.5)
RDW: 15 % (ref 11.5–15.5)
WBC: 13.8 10*3/uL — ABNORMAL HIGH (ref 4.0–10.5)
WBC: 11.8 10*3/uL — ABNORMAL HIGH (ref 4.0–10.5)
nRBC: 0 % (ref 0.0–0.2)

## 2018-07-20 LAB — COMPREHENSIVE METABOLIC PANEL
ALT: 19 U/L (ref 0–44)
AST: 20 U/L (ref 15–41)
Albumin: 2.8 g/dL — ABNORMAL LOW (ref 3.5–5.0)
Alkaline Phosphatase: 96 U/L (ref 38–126)
Anion gap: 8 (ref 5–15)
BUN: 45 mg/dL — ABNORMAL HIGH (ref 8–23)
CO2: 33 mmol/L — ABNORMAL HIGH (ref 22–32)
Calcium: 7.9 mg/dL — ABNORMAL LOW (ref 8.9–10.3)
Chloride: 97 mmol/L — ABNORMAL LOW (ref 98–111)
Creatinine, Ser: 1.63 mg/dL — ABNORMAL HIGH (ref 0.61–1.24)
GFR calc Af Amer: 44 mL/min — ABNORMAL LOW (ref >60)
GFR calc non Af Amer: 38 mL/min — ABNORMAL LOW (ref >60)
Glucose, Bld: 320 mg/dL — ABNORMAL HIGH (ref 70–99)
POTASSIUM: 3.2 mmol/L — AB (ref 3.5–5.1)
Sodium: 138 mmol/L (ref 135–145)
Total Bilirubin: 0.7 mg/dL (ref 0.3–1.2)
Total Protein: 5.1 g/dL — ABNORMAL LOW (ref 6.5–8.1)

## 2018-07-20 LAB — TROPONIN I
TROPONIN I: 0.1 ng/mL — AB (ref <0.03)
TROPONIN I: 0.12 ng/mL — AB (ref <0.03)
Troponin I: 0.22 ng/mL (ref <0.03)

## 2018-07-20 LAB — CREATININE, SERUM
CREATININE: 1.76 mg/dL — AB (ref 0.61–1.24)
GFR calc Af Amer: 40 mL/min — ABNORMAL LOW (ref >60)
GFR calc non Af Amer: 35 mL/min — ABNORMAL LOW (ref >60)

## 2018-07-20 LAB — GLUCOSE, CAPILLARY: Glucose-Capillary: 359 mg/dL — ABNORMAL HIGH (ref 70–99)

## 2018-07-20 LAB — LACTIC ACID, PLASMA: Lactic Acid, Venous: 1.4 mmol/L (ref 0.5–1.9)

## 2018-07-20 LAB — INFLUENZA PANEL BY PCR (TYPE A & B)
INFLAPCR: NEGATIVE
Influenza B By PCR: NEGATIVE

## 2018-07-20 MED ORDER — METOPROLOL TARTRATE 50 MG PO TABS
50.0000 mg | ORAL_TABLET | Freq: Two times a day (BID) | ORAL | Status: DC
  Administered 2018-07-20 – 2018-07-23 (×6): 50 mg via ORAL
  Filled 2018-07-20 (×6): qty 1

## 2018-07-20 MED ORDER — ISOSORBIDE MONONITRATE ER 60 MG PO TB24
60.0000 mg | ORAL_TABLET | Freq: Every day | ORAL | Status: DC
  Administered 2018-07-21 – 2018-07-23 (×3): 60 mg via ORAL
  Filled 2018-07-20 (×3): qty 1

## 2018-07-20 MED ORDER — ONDANSETRON HCL 4 MG PO TABS: 4.0000 mg | ORAL_TABLET | Freq: Four times a day (QID) | ORAL | Status: DC | PRN

## 2018-07-20 MED ORDER — ASPIRIN 81 MG PO CHEW: 324.0000 mg | CHEWABLE_TABLET | Freq: Once | ORAL | Status: DC

## 2018-07-20 MED ORDER — ONDANSETRON HCL 4 MG/2ML IJ SOLN: 4.0000 mg | Freq: Four times a day (QID) | INTRAMUSCULAR | Status: DC | PRN

## 2018-07-20 MED ORDER — SODIUM CHLORIDE 0.9% FLUSH: 3.0000 mL | INTRAVENOUS | Status: DC | PRN

## 2018-07-20 MED ORDER — SODIUM CHLORIDE 0.9 % IV BOLUS
1000.0000 mL | Freq: Once | INTRAVENOUS | Status: AC
  Administered 2018-07-20: 1000 mL via INTRAVENOUS

## 2018-07-20 MED ORDER — ACETAMINOPHEN 325 MG PO TABS: 650.0000 mg | ORAL_TABLET | Freq: Four times a day (QID) | ORAL | Status: DC | PRN

## 2018-07-20 MED ORDER — SODIUM CHLORIDE 0.9 % IV SOLN
2.0000 g | INTRAVENOUS | Status: DC
  Administered 2018-07-20 – 2018-07-22 (×3): 2 g via INTRAVENOUS
  Filled 2018-07-20 (×5): qty 2

## 2018-07-20 MED ORDER — DOCUSATE SODIUM 100 MG PO CAPS
100.0000 mg | ORAL_CAPSULE | Freq: Two times a day (BID) | ORAL | Status: DC
  Administered 2018-07-20 – 2018-07-23 (×6): 100 mg via ORAL
  Filled 2018-07-20 (×6): qty 1

## 2018-07-20 MED ORDER — POLYETHYLENE GLYCOL 3350 17 G PO PACK: 17.0000 g | PACK | Freq: Every day | ORAL | Status: DC | PRN

## 2018-07-20 MED ORDER — LEVOFLOXACIN IN D5W 750 MG/150ML IV SOLN: 750.0000 mg | Freq: Once | INTRAVENOUS | Status: DC

## 2018-07-20 MED ORDER — INSULIN ASPART 100 UNIT/ML ~~LOC~~ SOLN: 8.0000 [IU] | Freq: Three times a day (TID) | SUBCUTANEOUS | Status: DC

## 2018-07-20 MED ORDER — BISACODYL 10 MG RE SUPP
10.0000 mg | Freq: Every day | RECTAL | Status: DC | PRN
  Filled 2018-07-20: qty 1

## 2018-07-20 MED ORDER — ACETAMINOPHEN 650 MG RE SUPP: 650.0000 mg | Freq: Four times a day (QID) | RECTAL | Status: DC | PRN

## 2018-07-20 MED ORDER — INSULIN DETEMIR 100 UNIT/ML ~~LOC~~ SOLN
35.0000 [IU] | Freq: Every day | SUBCUTANEOUS | Status: DC
  Administered 2018-07-20: 35 [IU] via SUBCUTANEOUS
  Filled 2018-07-20 (×2): qty 0.35

## 2018-07-20 MED ORDER — FUROSEMIDE 40 MG PO TABS
40.0000 mg | ORAL_TABLET | Freq: Every day | ORAL | Status: DC
  Administered 2018-07-21 – 2018-07-23 (×3): 40 mg via ORAL
  Filled 2018-07-20 (×3): qty 1

## 2018-07-20 MED ORDER — IPRATROPIUM-ALBUTEROL 0.5-2.5 (3) MG/3ML IN SOLN
3.0000 mL | RESPIRATORY_TRACT | Status: DC
  Administered 2018-07-20 – 2018-07-22 (×10): 3 mL via RESPIRATORY_TRACT
  Filled 2018-07-20 (×10): qty 3

## 2018-07-20 MED ORDER — ATORVASTATIN CALCIUM 20 MG PO TABS
40.0000 mg | ORAL_TABLET | Freq: Every evening | ORAL | Status: DC
  Administered 2018-07-20 – 2018-07-23 (×4): 40 mg via ORAL
  Filled 2018-07-20 (×5): qty 2

## 2018-07-20 MED ORDER — LORAZEPAM 0.5 MG PO TABS
0.5000 mg | ORAL_TABLET | Freq: Every day | ORAL | Status: DC
  Administered 2018-07-21 – 2018-07-23 (×3): 0.5 mg via ORAL
  Filled 2018-07-20 (×3): qty 1

## 2018-07-20 MED ORDER — FEBUXOSTAT 40 MG PO TABS
40.0000 mg | ORAL_TABLET | Freq: Every day | ORAL | Status: DC
  Administered 2018-07-21 – 2018-07-23 (×3): 40 mg via ORAL
  Filled 2018-07-20 (×4): qty 1

## 2018-07-20 MED ORDER — FUROSEMIDE 10 MG/ML IJ SOLN
40.0000 mg | Freq: Once | INTRAMUSCULAR | Status: AC
  Administered 2018-07-20: 40 mg via INTRAVENOUS
  Filled 2018-07-20: qty 4

## 2018-07-20 MED ORDER — VITAMIN B-12 1000 MCG PO TABS
1000.0000 ug | ORAL_TABLET | Freq: Every day | ORAL | Status: DC
  Administered 2018-07-21 – 2018-07-23 (×3): 1000 ug via ORAL
  Filled 2018-07-20 (×4): qty 1

## 2018-07-20 MED ORDER — INSULIN ASPART 100 UNIT/ML ~~LOC~~ SOLN
0.0000 [IU] | Freq: Three times a day (TID) | SUBCUTANEOUS | Status: DC
  Administered 2018-07-21: 2 [IU] via SUBCUTANEOUS
  Administered 2018-07-21 – 2018-07-22 (×3): 1 [IU] via SUBCUTANEOUS
  Administered 2018-07-22 – 2018-07-23 (×2): 3 [IU] via SUBCUTANEOUS
  Administered 2018-07-23: 7 [IU] via SUBCUTANEOUS
  Filled 2018-07-20 (×7): qty 1

## 2018-07-20 MED ORDER — VITAMIN D3 25 MCG (1000 UNIT) PO TABS
1000.0000 [IU] | ORAL_TABLET | Freq: Every day | ORAL | Status: DC
  Administered 2018-07-21 – 2018-07-23 (×3): 1000 [IU] via ORAL
  Filled 2018-07-20 (×4): qty 1

## 2018-07-20 MED ORDER — SODIUM CHLORIDE 0.9 % IV SOLN: 250.0000 mL | INTRAVENOUS | Status: DC | PRN

## 2018-07-20 MED ORDER — INSULIN ASPART 100 UNIT/ML ~~LOC~~ SOLN
10.0000 [IU] | Freq: Once | SUBCUTANEOUS | Status: AC
  Administered 2018-07-20: 10 [IU] via INTRAVENOUS
  Filled 2018-07-20: qty 1

## 2018-07-20 MED ORDER — HYDROCODONE-ACETAMINOPHEN 5-325 MG PO TABS: 1.0000 | ORAL_TABLET | ORAL | Status: DC | PRN

## 2018-07-20 MED ORDER — PANTOPRAZOLE SODIUM 40 MG PO TBEC
40.0000 mg | DELAYED_RELEASE_TABLET | Freq: Every day | ORAL | Status: DC
  Administered 2018-07-21 – 2018-07-23 (×3): 40 mg via ORAL
  Filled 2018-07-20 (×3): qty 1

## 2018-07-20 MED ORDER — POTASSIUM CHLORIDE CRYS ER 20 MEQ PO TBCR
40.0000 meq | EXTENDED_RELEASE_TABLET | Freq: Once | ORAL | Status: AC
  Administered 2018-07-20: 40 meq via ORAL
  Filled 2018-07-20: qty 2

## 2018-07-20 MED ORDER — AZITHROMYCIN 500 MG PO TABS
500.0000 mg | ORAL_TABLET | Freq: Once | ORAL | Status: DC
  Filled 2018-07-20: qty 1

## 2018-07-20 MED ORDER — ENOXAPARIN SODIUM 40 MG/0.4ML ~~LOC~~ SOLN
40.0000 mg | SUBCUTANEOUS | Status: DC
  Administered 2018-07-20: 40 mg via SUBCUTANEOUS
  Filled 2018-07-20 (×2): qty 0.4

## 2018-07-20 MED ORDER — HYDRALAZINE HCL 25 MG PO TABS
25.0000 mg | ORAL_TABLET | Freq: Three times a day (TID) | ORAL | Status: DC
  Administered 2018-07-20 – 2018-07-23 (×9): 25 mg via ORAL
  Filled 2018-07-20 (×9): qty 1

## 2018-07-20 MED ORDER — SODIUM CHLORIDE 0.9% FLUSH
3.0000 mL | Freq: Two times a day (BID) | INTRAVENOUS | Status: DC
  Administered 2018-07-20 – 2018-07-23 (×4): 3 mL via INTRAVENOUS

## 2018-07-20 MED ORDER — TORSEMIDE 20 MG PO TABS
20.0000 mg | ORAL_TABLET | Freq: Two times a day (BID) | ORAL | Status: DC
  Administered 2018-07-21 – 2018-07-23 (×5): 20 mg via ORAL
  Filled 2018-07-20 (×5): qty 1

## 2018-07-20 NOTE — ED Notes (Signed)
ED TO INPATIENT HANDOFF REPORT  ED Nurse Name and Phone #: Jeanett Schlein [phone]  S Name/Age/Gender Justin Leach 85 y.o. male Room/Bed: ED15A/ED15A  Code Status   Code Status: Full Code  Home/SNF/Other Nursing Home Patient oriented to: self, place and situation Is this baseline? Yes   Triage Complete: Triage complete  Chief Complaint Chest Pain  Triage Note Patient sent from [facility] with c/o CP. Patient given 1 nitro SL at Nursing and arrives to ED via EMS CP free. Denies pain at current time. Positive cough with greenish productive sputum.    Allergies Allergies  Allergen Reactions  . 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 Patient subsequently taken off amiodarone.  . Nifedipine Other (See Comments)    Reaction: made gums swell up. Pt states that he had to have gum surgery after taking.     Level of Care/Admitting Diagnosis ED Disposition    ED Disposition Condition [facility] Area: [place] [[id number]]  Level of Care: Telemetry [5]  Diagnosis: Pneumonia [[id number]]  Admitting Physician: Bettey Costa [[id number]]  Attending Physician: MODY, Ulice Bold [[id number]]  Estimated length of stay: past midnight tomorrow  Certification:: I certify this patient will need inpatient services for at least 2 midnights  PT Class (Do Not Modify): Inpatient [101]  PT Acc Code (Do Not Modify): Private [1]       B Medical/Surgery History Past Medical History:  Diagnosis Date  . Bradycardia    a. during 02/2015 admission - occasional HR in 40s while being treated for atrial fib.  . Cervical disc disease   . Chronic diastolic CHF (congestive heart failure) ([place])    a. Dx 02/2015 -  2D echo 03/02/15 showed severe focal basal hypertrophy of the septum, EF 55-60%, mild MR, no effusion.  . CKD (chronic kidney disease), stage III ([place])   . Coronary artery  calcification seen on CT scan    a. Nuc 02/2015 was normal.  . Dyslipidemia   . GERD (gastroesophageal reflux disease)   . Hypertension   . 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 PAF (paroxysmal atrial fibrillation) ([place])    a. Dx 02/2015 - in and out on tele, rx'd Coumadin and amiodarone.  . Pneumonia 2007  . Shortness of breath dyspnea    WITH EXERTION  . Type II diabetes mellitus ([place])    Past Surgical History:  Procedure Laterality Date  . CATARACT EXTRACTION W/ INTRAOCULAR LENS  IMPLANT, BILATERAL Bilateral   . CERVICAL DISC SURGERY     "i've had 3 neck ORs; not sure what kind; all thru the back of my neck"  . CHOLECYSTECTOMY    . COLONOSCOPY WITH PROPOFOL N/A 01/02/2016   Procedure: COLONOSCOPY WITH PROPOFOL;  Surgeon: [name], MD;  Location: [facility];  Service: Endoscopy;  Laterality: N/A;  . EYE SURGERY  1930's   "don't know what for" (05/11/2013)  . JOINT REPLACEMENT     bilateral knees, elbows, and shoulders (on 05/11/2013 pt denies all joint replacements"   . KNEE ARTHROPLASTY     "had one scope; one open knee OR; not sure which on which side" (05/10/2013)  . KNEE ARTHROSCOPY     "had one scope; one open knee OR; not sure which on which side" (05/10/2013)  . POSTERIOR FUSION CERVICAL SPINE    . REVERSE  SHOULDER ARTHROPLASTY Left 05/19/2018   Procedure: REVERSE SHOULDER ARTHROPLASTY;  Surgeon: [name], MD;  Location: [facility];  Service: Orthopedics;  Laterality: Left;  . ROTATOR CUFF REPAIR Bilateral   . VIDEO BRONCHOSCOPY Bilateral 02/01/2016   Procedure: VIDEO BRONCHOSCOPY WITHOUT FLUORO;  Surgeon: Marshell [name], MD;  Location: [facility];  Service: Cardiopulmonary;  Laterality: Bilateral;     A IV Location/Drains/Wounds Patient Lines/Drains/Airways Status   Active Line/Drains/Airways    Name:   Placement date:   Placement time:   Site:   Days:   Peripheral IV 05/24/18 Right Wrist   05/24/18     1030    Wrist   57   Peripheral IV 07/20/18 Right Forearm   07/20/18    1625    Forearm   less than 1   Peripheral IV 07/20/18 Right Forearm   07/20/18    2017    Forearm   less than 1   External Urinary Catheter   05/20/18    1930    -   61   Incision (Closed) 05/19/18 Arm Left   05/19/18    1511     62   Wound / Incision (Open or Dehisced) 05/14/18 Laceration Eye Left stiches   05/14/18    0000    Eye   67          Intake/Output Last 24 hours  Intake/Output Summary (Last 24 hours) at 07/20/2018 2140 Last data filed at 07/20/2018 1944 Gross per 24 hour  Intake -  Output 400 ml  Net -400 ml    Labs/Imaging Results for orders placed or performed during the hospital encounter of 07/20/18 (from the past 48 hour(s))  CBC     Status: Abnormal   Collection Time: 07/20/18  3:41 PM  Result Value Ref Range   WBC 13.8 (H) 4.0 - 10.5 K/uL   RBC 3.67 (L) 4.22 - 5.81 MIL/uL   Hemoglobin 10.8 (L) 13.0 - 17.0 g/dL   HCT 34.7 (L) 39.0 - 52.0 %   MCV 94.6 80.0 - 100.0 fL   MCH 29.4 26.0 - 34.0 pg   MCHC 31.1 30.0 - 36.0 g/dL   RDW 15.0 11.5 - 15.5 %   Platelets 177 150 - 400 K/uL   nRBC 0.0 0.0 - 0.2 %    Comment: Performed at [facility], [address] [place], [place] [address]  Troponin I - ONCE - STAT     Status: Abnormal   Collection Time: 07/20/18  3:41 PM  Result Value Ref Range   Troponin I 0.10 (HH) <0.03 ng/mL    Comment: CRITICAL RESULT CALLED TO, READ BACK BY AND VERIFIED WITH jEANETTE PEREZ AT 6979 ON 07/20/2018 [place]. Performed at [facility], [address] [place], [place] [address]   Troponin I - Once-Timed     Status: Abnormal   Collection Time: 07/20/18  4:59 PM  Result Value Ref Range   Troponin I 0.12 (HH) <0.03 ng/mL    Comment: CRITICAL RESULT CALLED TO, READ BACK BY AND VERIFIED WITH RACHEL HAYDEN @1808  07/20/18 [place] Performed at [facility], [address] [place], [place] [address]   Influenza panel by PCR (type A & B)     Status:  None   Collection Time: 07/20/18  4:59 PM  Result Value Ref Range   Influenza A By PCR NEGATIVE NEGATIVE   Influenza B By PCR NEGATIVE NEGATIVE    Comment: (NOTE) The Xpert Xpress Flu assay is intended  as an aid in the diagnosis of  influenza and should not be used as a sole basis for treatment.  This  assay is FDA approved for nasopharyngeal swab specimens only. Nasal  washings and aspirates are unacceptable for Xpert Xpress Flu testing. Performed at [facility], [address] [place], [place] [address]   Comprehensive metabolic panel     Status: Abnormal   Collection Time: 07/20/18  4:59 PM  Result Value Ref Range   Sodium 138 135 - 145 mmol/L   Potassium 3.2 (L) 3.5 - 5.1 mmol/L   Chloride 97 (L) 98 - 111 mmol/L   CO2 33 (H) 22 - 32 mmol/L   Glucose, Bld 320 (H) 70 - 99 mg/dL   BUN 45 (H) 8 - 23 mg/dL   Creatinine, Ser 1.63 (H) 0.61 - 1.24 mg/dL   Calcium 7.9 (L) 8.9 - 10.3 mg/dL   Total Protein 5.1 (L) 6.5 - 8.1 g/dL   Albumin 2.8 (L) 3.5 - 5.0 g/dL   AST 20 15 - 41 U/L   ALT 19 0 - 44 U/L   Alkaline Phosphatase 96 38 - 126 U/L   Total Bilirubin 0.7 0.3 - 1.2 mg/dL   GFR calc non Af Amer 38 (L) >60 mL/min   GFR calc Af Amer 44 (L) >60 mL/min   Anion gap 8 5 - 15    Comment: Performed at [facility], [address] [place], [place] [address]  Lactic acid, plasma     Status: None   Collection Time: 07/20/18  6:48 PM  Result Value Ref Range   Lactic Acid, Venous 1.4 0.5 - 1.9 mmol/L    Comment: Performed at [facility], [address] [place], [place] [address]  Troponin I - Now Then Q6H     Status: Abnormal   Collection Time: 07/20/18  6:48 PM  Result Value Ref Range   Troponin I 0.22 (HH) <0.03 ng/mL    Comment: CRITICAL VALUE NOTED. VALUE IS CONSISTENT WITH PREVIOUSLY REPORTED/CALLED VALUE SMA Performed at [facility], [address] [place], [place] [address]   CBC     Status: Abnormal   Collection Time: 07/20/18   6:48 PM  Result Value Ref Range   WBC 11.8 (H) 4.0 - 10.5 K/uL   RBC 3.53 (L) 4.22 - 5.81 MIL/uL   Hemoglobin 10.4 (L) 13.0 - 17.0 g/dL   HCT 33.2 (L) 39.0 - 52.0 %   MCV 94.1 80.0 - 100.0 fL   MCH 29.5 26.0 - 34.0 pg   MCHC 31.3 30.0 - 36.0 g/dL   RDW 15.0 11.5 - 15.5 %   Platelets 149 (L) 150 - 400 K/uL   nRBC 0.0 0.0 - 0.2 %    Comment: Performed at [facility], [address] [place], [place] [address]  Creatinine, serum     Status: Abnormal   Collection Time: 07/20/18  6:48 PM  Result Value Ref Range   Creatinine, Ser 1.76 (H) 0.61 - 1.24 mg/dL   GFR calc non Af Amer 35 (L) >60 mL/min   GFR calc Af Amer 40 (L) >60 mL/min    Comment: Performed at [facility], [address] [place], [place] [address]  Glucose, capillary     Status: Abnormal   Collection Time: 07/20/18  7:35 PM  Result Value Ref Range   Glucose-Capillary 359 (H) 70 - 99 mg/dL   Dg Chest 2 View  Result Date: 07/20/2018 CLINICAL DATA:  Productive cough.  Chest pain. EXAM: CHEST - 2 VIEW COMPARISON:  CT of the chest 07/16/2018. FINDINGS: The heart is mildly enlarged. Chronic left basilar airspace opacities are again noted. No superimposed disease is present. There is no edema or effusion. No focal airspace disease is present. Exaggerated kyphosis is again noted. Surgical clips are present at the gallbladder fossa. A left shoulder replacement is noted. IMPRESSION: 1. No acute cardiopulmonary disease or significant interval change. 2. Stable chronic opacities at the left base. Electronically Signed   By: San Morelle M.D.   On: 07/20/2018 16:01   Ct Head Wo Contrast  Result Date: 07/20/2018 CLINICAL DATA:  Productive cough.  Fever.  History of bleed. EXAM: CT HEAD WITHOUT CONTRAST TECHNIQUE: Contiguous axial images were obtained from the base of the skull through the vertex without intravenous contrast. COMPARISON:  June 24, 2018 FINDINGS: Brain: No subdural, epidural, or subarachnoid  hemorrhage. Ventricular prominence is out of proportion to mild sulcal prominence but stable. Cerebellum, brainstem, and basal cisterns are normal. No mass effect or midline shift. White matter changes. No acute cortical ischemia or infarct. Vascular: Calcified atherosclerosis is seen in the intracranial carotids. Skull: Normal. Negative for fracture or focal lesion. Sinuses/Orbits: There is fluid in left mastoid air cells, unchanged. The right mastoid air cells and middle ears are well aerated. No significant sinus abnormalities. Other: None. IMPRESSION: 1. No acute intracranial abnormality. 2. Ventricular prominence is out of proportion to sulcal prominence but is stable. This finding is nonspecific but could be seen with normal pressure hydrocephalus. 3. Fluid in left mastoid air cells, stable. Electronically Signed   By: Dorise Bullion III M.D   On: 07/20/2018 18:08    Pending Labs Unresulted Labs (From admission, onward)    Start     Ordered   07/27/18 0500  Creatinine, serum  (enoxaparin (LOVENOX)    CrCl >/= 30 ml/min)  Weekly,   STAT    Comments:  while on enoxaparin therapy    07/20/18 1839   07/21/18 1610  Basic metabolic panel  Tomorrow morning,   STAT     07/20/18 1839   07/20/18 1853  MRSA PCR Screening  ONCE - STAT,   STAT     07/20/18 1852   07/20/18 1838  Hemoglobin A1c  Once,   STAT     07/20/18 1839   07/20/18 1834  Troponin I - Now Then Q6H  Now then every 6 hours,   STAT     07/20/18 1839   07/20/18 1811  Blood culture (routine x 2)  BLOOD CULTURE X 2,   STAT     07/20/18 1810   07/20/18 1811  Lactic acid, plasma  Now then every 2 hours,   STAT     07/20/18 1810          Vitals/Pain Today's Vitals   07/20/18 2015 07/20/18 2030 07/20/18 2045 07/20/18 2058  BP:  (!) 165/46    Pulse: 66 66 66   Resp: (!) 26 (!) 21 (!) 21   Temp:      TempSrc:      SpO2: 96% 97% 98%   Weight:      Height:      PainSc:    0-No pain    Isolation Precautions Droplet  precaution  Medications Medications  pantoprazole (PROTONIX) EC tablet 40 mg (has no administration in time range)  atorvastatin (LIPITOR) tablet 40 mg (40 mg Oral Given 07/20/18 2032)  metoprolol tartrate (LOPRESSOR) tablet 50 mg (has no administration  in time range)  furosemide (LASIX) tablet 40 mg (has no administration in time range)  hydrALAZINE (APRESOLINE) tablet 25 mg (has no administration in time range)  isosorbide mononitrate (IMDUR) 24 hr tablet 60 mg (has no administration in time range)  docusate sodium (COLACE) capsule 100 mg (has no administration in time range)  bisacodyl (DULCOLAX) suppository 10 mg (has no administration in time range)  polyethylene glycol (MIRALAX / GLYCOLAX) packet 17 g (has no administration in time range)  cholecalciferol (VITAMIN D3) tablet 1,000 Units (has no administration in time range)  vitamin B-12 (CYANOCOBALAMIN) tablet 1,000 mcg (has no administration in time range)  febuxostat (ULORIC) tablet 40 mg (has no administration in time range)  insulin aspart (novoLOG) injection 8 Units (has no administration in time range)  insulin detemir (LEVEMIR) injection 35 Units (has no administration in time range)  torsemide (DEMADEX) tablet 20 mg (has no administration in time range)  LORazepam (ATIVAN) tablet 0.5 mg (has no administration in time range)  ipratropium-albuterol (DUONEB) 0.5-2.5 (3) MG/3ML nebulizer solution 3 mL (3 mLs Nebulization Given 07/20/18 1932)  insulin aspart (novoLOG) injection 0-9 Units (has no administration in time range)  enoxaparin (LOVENOX) injection 40 mg (40 mg Subcutaneous Given 07/20/18 1933)  acetaminophen (TYLENOL) tablet 650 mg (has no administration in time range)    Or  acetaminophen (TYLENOL) suppository 650 mg (has no administration in time range)  sodium chloride flush (NS) 0.9 % injection 3 mL (has no administration in time range)  sodium chloride flush (NS) 0.9 % injection 3 mL (has no administration in time range)   0.9 %  sodium chloride infusion (has no administration in time range)  HYDROcodone-acetaminophen (NORCO/VICODIN) 5-325 MG per tablet 1 tablet (has no administration in time range)  ondansetron (ZOFRAN) tablet 4 mg (has no administration in time range)    Or  ondansetron (ZOFRAN) injection 4 mg (has no administration in time range)  ceFEPIme (MAXIPIME) 2 g in sodium chloride 0.9 % 100 mL IVPB (2 g Intravenous New Bag/Given 07/20/18 2032)  potassium chloride SA (K-DUR,KLOR-CON) CR tablet 40 mEq (40 mEq Oral Given 07/20/18 1932)  insulin aspart (novoLOG) injection 10 Units (10 Units Intravenous Given 07/20/18 1937)  sodium chloride 0.9 % bolus 1,000 mL (1,000 mLs Intravenous New Bag/Given 07/20/18 2032)  furosemide (LASIX) injection 40 mg (40 mg Intravenous Given 07/20/18 1932)    Mobility non-ambulatory High fall risk   Focused Assessments Cardiac Assessment Handoff:  Cardiac Rhythm: Normal sinus rhythm Lab Results  Component Value Date   CKTOTAL 215 05/13/2018   TROPONINI 0.22 ([place]) 07/20/2018   Lab Results  Component Value Date   DDIMER 0.86 (H) 02/28/2015   Does the Patient currently have chest pain? No     R Recommendations: See Admitting Provider Note  Report given to:   Additional Notes: Trop: .22

## 2018-07-20 NOTE — ED Triage Notes (Signed)
Patient sent from [facility] NH with c/o CP. Patient given 1 nitro SL at Nursing and arrives to ED via EMS CP free. Denies pain at current time. Positive cough with greenish productive sputum.

## 2018-07-20 NOTE — ED Provider Notes (Addendum)
[facility] Emergency Department Provider Note  ____________________________________________  Time seen: Approximately 4:46 PM  I have reviewed the triage vital signs and the nursing notes.   HISTORY  Chief Complaint Chest Pain    HPI Justin Leach is a 85 y.o. male with a history of interstitial lung disease on supplemental oxygen, diastolic CHF, CAD, CKD, paroxysmal A. fib, presenting for chest pain and cough.  The patient is a poor historian and does not know why he is here.  Per EMS, and now his wife who has accompanied him, he has been having a cough productive of thick green sputum for the last several days without any change in his oxygen requirement, fevers or chills, sore throat, ear pain, congestion or rhinorrhea.  Today, he was complaining of chest pain.  The patient does not remember this and states he is not having chest pain right now.  No lower extremity swelling or calf pain.  His wife denies any GI symptoms including nausea vomiting and diarrhea.  No changes in his mental status.   Past Medical History:  Diagnosis Date  . Bradycardia    a. during 02/2015 admission - occasional HR in 40s while being treated for atrial fib.  . Cervical disc disease   . Chronic diastolic CHF (congestive heart failure) ([place])    a. Dx 02/2015 -  2D echo 03/02/15 showed severe focal basal hypertrophy of the septum, EF 55-60%, mild MR, no effusion.  . CKD (chronic kidney disease), stage III ([place])   . Coronary artery calcification seen on CT scan    a. Nuc 02/2015 was normal.  . Dyslipidemia   . GERD (gastroesophageal reflux disease)   . Hypertension   . 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 PAF (paroxysmal atrial fibrillation) ([place])    a. Dx 02/2015 - in and out on tele, rx'd Coumadin and amiodarone.  . Pneumonia 2007  . Shortness of breath dyspnea    WITH EXERTION  . Type II diabetes mellitus [facility])     Patient  Active Problem List   Diagnosis Date Noted  . Palliative care encounter 06/26/2018  . Weakness generalized 06/26/2018  . HCAP (healthcare-associated pneumonia) 06/22/2018  . Gouty arthritis of right great toe 06/05/2018  . Hypertensive heart and kidney disease with chronic diastolic congestive heart failure and stage 3 chronic kidney disease ([place]) 06/01/2018  . Dyslipidemia associated with type 2 diabetes mellitus ([place]) 06/01/2018  . Chronic constipation 06/01/2018  . CKD stage 3 due to type 2 diabetes mellitus ([place]) 06/01/2018  . Volume overload 05/20/2018  . Fall 05/13/2018  . SDH (subdural hematoma) ([place]) 05/13/2018  . Closed comminuted left humeral fracture 05/13/2018  . Hyperkalemia 05/13/2018  . Leukocytosis 05/13/2018  . Fall at home   . Subdural hematoma without coma ([place])   . Cough   . GERD (gastroesophageal reflux disease)   . Pulmonary fibrosis ([place])   . Coronary artery calcification seen on CT scan   . Chronic diastolic CHF (congestive heart failure) ([place])   . PAF (paroxysmal atrial fibrillation) ([place])   . CKD (chronic kidney disease), stage III ([place])   . Type II diabetes mellitus with renal manifestations ([place])   . Essential hypertension   . Chest pain 02/28/2015  . Renal failure (ARF), acute on chronic ([place]) 02/28/2015  . Hyperlipidemia 02/28/2015  . Dyspnea 03/29/2014  . DOE (dyspnea on exertion) 03/29/2014  . Bradycardia 03/29/2014  . Bronchospasm 05/15/2013  Past Surgical History:  Procedure Laterality Date  . CATARACT EXTRACTION W/ INTRAOCULAR LENS  IMPLANT, BILATERAL Bilateral   . CERVICAL DISC SURGERY     "i've had 3 neck ORs; not sure what kind; all thru the back of my neck"  . CHOLECYSTECTOMY    . COLONOSCOPY WITH PROPOFOL N/A 01/02/2016   Procedure: COLONOSCOPY WITH PROPOFOL;  Surgeon: [name], MD;  Location: [facility];  Service: Endoscopy;  Laterality: N/A;  . EYE SURGERY  1930's   "don't know what for" (05/11/2013)  . JOINT  REPLACEMENT     bilateral knees, elbows, and shoulders (on 05/11/2013 pt denies all joint replacements"   . KNEE ARTHROPLASTY     "had one scope; one open knee OR; not sure which on which side" (05/10/2013)  . KNEE ARTHROSCOPY     "had one scope; one open knee OR; not sure which on which side" (05/10/2013)  . POSTERIOR FUSION CERVICAL SPINE    . REVERSE SHOULDER ARTHROPLASTY Left 05/19/2018   Procedure: REVERSE SHOULDER ARTHROPLASTY;  Surgeon: [name], MD;  Location: [place];  Service: Orthopedics;  Laterality: Left;  . ROTATOR CUFF REPAIR Bilateral   . VIDEO BRONCHOSCOPY Bilateral 02/01/2016   Procedure: VIDEO BRONCHOSCOPY WITHOUT FLUORO;  Surgeon: Marshell [name], MD;  Location: [facility];  Service: Cardiopulmonary;  Laterality: Bilateral;    Current Outpatient Rx  . Order #: [id number] Class: Historical Med  . Order #: [id number] Class: Historical Med  . Order #: [id number] Class: Historical Med  . Order #: [id number] Class: No Print  . Order #: [id number] Class: No Print  . Order #: [id number] Class: No Print  . Order #: [id number] Class: Historical Med  . Order #: [id number] Class: No Print  . Order #: [id number] Class: No Print  . Order #: [id number] Class: No Print  . Order #: [id number] Class: Historical Med  . Order #: [id number] Class: Historical Med  . Order #: [id number] Class: No Print  . Order #: [id number] Class: Historical Med  . Order #: [id number] Class: Historical Med  . Order #: [id number] Class: Historical Med  . Order #: [id number] Class: Historical Med  . Order #: [id number] Class: No Print  . Order #: [id number] Class: No Print  . Order #: [id number] Class: No Print    Allergies Amiodarone and Nifedipine  Family History  Problem Relation Age of Onset  . Diabetes Mellitus II Paternal Grandmother   . Heart disease Neg Hx        He does not know his father's history.   . Cancer Neg Hx   . Diabetes Neg Hx   . CAD Neg Hx   . Lung disease Neg Hx   . Rheumatologic disease Neg  Hx     Social History Social History   Tobacco Use  . Smoking status: Never Smoker  . Smokeless tobacco: Former Systems developer    Types: Chew  Substance Use Topics  . Alcohol use: No  . Drug use: No    Comment: used chew when I was a teenager per patient     Review of Systems Limited due to patient dementia. Constitutional: No fever/chills.  No changes in mental status. Eyes: No visual changes. ENT: No sore throat. No congestion or rhinorrhea. Cardiovascular: Positive chest pain. Denies palpitations. Respiratory: Denies shortness of breath.  Positive productive cough. Gastrointestinal: No abdominal pain.  No nausea, no vomiting.  No diarrhea.  No constipation. Genitourinary: Negative for dysuria. Musculoskeletal: Negative for back pain. Skin: Negative for rash. Neurological: Negative for headaches. No focal numbness, tingling or weakness.     ____________________________________________  PHYSICAL EXAM:  VITAL SIGNS: ED Triage Vitals  Enc Vitals Group     BP 07/20/18 1532 (!) 151/52     Pulse Rate 07/20/18 1532 68     Resp 07/20/18 1532 18     Temp 07/20/18 1532 98.6 F (37 C)     Temp Source 07/20/18 1532 Oral     SpO2 07/20/18 1532 95 %     Weight 07/20/18 1533 189 lb 9.5 oz (86 kg)     Height 07/20/18 1533 5\' 9"  (1.753 m)     Head Circumference --      Peak Flow --      Pain Score 07/20/18 1533 0     Pain Loc --      Pain Edu? --      Excl. in [place]? --     Constitutional: The patient is alert but not oriented.  He is able to answer questions appropriately.  His speech is clear.  He is markedly hard of hearing.  GCS is 15.. Eyes: Conjunctivae are normal.  EOMI. No scleral icterus.  No eye discharge. Head: Atraumatic. Nose: No congestion/rhinnorhea. Mouth/Throat: Mucous membranes are moist.  Neck: No stridor.  Supple.  No JVD.  No meningismus. Cardiovascular: Normal rate, regular rhythm. No murmurs, rubs or gallops.  Respiratory: Normal respiratory effort.  No  accessory muscle use or retractions. Lungs CTAB.  No wheezes, rales or ronchi.  Patient intermittently has a cough productive of thick green sputum. Gastrointestinal: Overweight soft, nontender and nondistended.  No guarding or rebound.  No peritoneal signs. Musculoskeletal: No LE edema. No ttp in the calves or palpable cords.  Negative Homan's sign. Neurologic:  A&Ox3.  Speech is clear.  Face and smile are symmetric.  EOMI.  Moves all extremities well. Skin:  Skin is warm, dry and intact. No rash noted. Psychiatric: Mood and affect are normal.   ____________________________________________   LABS (all labs ordered are listed, but only abnormal results are displayed)  Labs Reviewed  CBC - Abnormal; Notable for the following components:      Result Value   WBC 13.8 (*)    RBC 3.67 (*)    Hemoglobin 10.8 (*)    HCT 34.7 (*)    All other components within normal limits  COMPREHENSIVE METABOLIC PANEL  TROPONIN I   ____________________________________________  EKG  ED ECG REPORT I, Anne-Caroline Mariea Clonts, the attending physician, personally viewed and interpreted this ECG.   Date: 07/20/2018  EKG Time: 1527  Rate: 69  Rhythm: normal sinus rhythm; PVC  Axis: leftward  Intervals:none  ST&T Change: No STEMI  ____________________________________________  RADIOLOGY  Dg Chest 2 View  Result Date: 07/20/2018 CLINICAL DATA:  Productive cough.  Chest pain. EXAM: CHEST - 2 VIEW COMPARISON:  CT of the chest 07/16/2018. FINDINGS: The heart is mildly enlarged. Chronic left basilar airspace opacities are again noted. No superimposed disease is present. There is no edema or effusion. No focal airspace disease is present. Exaggerated kyphosis is again noted. Surgical clips are present at the gallbladder fossa. A left shoulder replacement is noted. IMPRESSION: 1. No acute cardiopulmonary disease or significant interval change. 2. Stable chronic opacities at the left base. Electronically Signed    By: San Morelle M.D.   On: 07/20/2018 16:01    ____________________________________________   PROCEDURES  Procedure(s) performed: None  Procedures  Critical Care performed: Yes ____________________________________________   INITIAL IMPRESSION / ASSESSMENT AND PLAN / ED COURSE  Pertinent labs & imaging results that were available during  my care of the patient were reviewed by me and considered in my medical decision making (see chart for details).  85 y.o. male with interstitial lung disease at rehab presenting for cough and chest pain.  Overall, the patient is hemodynamically stable.  His EKG shows sinus rhythm with PVCs.  He currently is resting comfortably and denying any pain.  He does have a cough that is productive of thick green sputum.  His chest x-ray does not show any acute infiltrate, but given his underlying lung pathology, it is not unreasonable to place the patient on a 5-day course of azithromycin with a Z-Pak.  I do not see any evidence of ACS or MI on his EKG, and his first troponin is negative; a second troponin is pending.  Plan reevaluation for final disposition.  5:32 PM Patient has a troponin of 0.10 and I am concerned about an STEMI if this patient did have an episode of chest pain today.  Aspirin has been ordered.  Because he is not having any chest pain at this time, heparin is not indicated.  I will plan admission to the hospital.  5:37 PM I have talked to the family and the patient about his results.  Prior to ordering aspirin, will get a repeat CT head to screen for his intracranial bleed.  He fell on 12/25 and had bilateral bleeds, and the family is concerned for ongoing bleed.  However he has no focal neurologic deficits, and is at mental status baseline.  ----------------------------------------- 6:11 PM on 07/20/2018 -----------------------------------------  The patient's troponin is trending upwards, but he continues to be chest pain-free.   The results of his CT head for administration of aspirin.  This will be signed out to the hospitalist.  He will receive Levaquin for hospital-acquired pneumonia given his cough and thick phlegm; blood cultures and lactic acid have been added; he has an elevated wbc and we will follow his lab work for evaluation of sepsis.   CRITICAL CARE Performed by: Eula Listen   Total critical care time: 40 minutes  Critical care time was exclusive of separately billable procedures and treating other patients.  Critical care was necessary to treat or prevent imminent or life-threatening deterioration.  Critical care was time spent personally by me on the following activities: development of treatment plan with patient and/or surrogate as well as nursing, discussions with consultants, evaluation of patient's response to treatment, examination of patient, obtaining history from patient or surrogate, ordering and performing treatments and interventions, ordering and review of laboratory studies, ordering and review of radiographic studies, pulse oximetry and re-evaluation of patient's condition.   ____________________________________________  FINAL CLINICAL IMPRESSION(S) / ED DIAGNOSES  Final diagnoses:  None         NEW MEDICATIONS STARTED DURING THIS VISIT:  New Prescriptions   No medications on file      Eula Listen, MD 07/20/18 1732    Eula Listen, MD 07/20/18 1738    Eula Listen, MD 07/20/18 [id number]

## 2018-07-20 NOTE — Progress Notes (Signed)
PHARMACY CONSULT NOTE - FOLLOW UP  Pharmacy Consult for Electrolyte Monitoring and Replacement   Recent Labs: Potassium (mmol/L)  Date Value  07/20/2018 3.2 (L)   Magnesium (mg/dL)  Date Value  03/16/2015 2.1   Calcium (mg/dL)  Date Value  07/20/2018 7.9 (L)   Albumin (g/dL)  Date Value  07/20/2018 2.8 (L)   Sodium (mmol/L)  Date Value  07/20/2018 138     Assessment: Pharmacy consulted to replace electrolytes. Potassium is slightly low at 3.2.   Goal of Therapy:  K+ 3.5-5   Plan:  Will order KCl 40 mEq x 1 and recheck BMP in the AM.   Oswald Hillock ,PharmD, BCPS Clinical Pharmacist 07/20/2018 6:54 PM

## 2018-07-20 NOTE — ED Notes (Signed)
Patient off unit to CT

## 2018-07-20 NOTE — H&P (Signed)
[facility] NAME: Justin Leach    MR#:  [id number]  DATE OF BIRTH:  1931/11/08  DATE OF ADMISSION:  07/20/2018  PRIMARY CARE PHYSICIAN: Lavone Orn, MD   REQUESTING/REFERRING PHYSICIAN: dr Mariea Clonts  CHIEF COMPLAINT:   Chest pain SOB HISTORY OF PRESENT ILLNESS:  Justin Leach  is a 85 y.o. male with a known history of chronic diastolic heart failure, PAF, recent subdural hematoma after a fall in December, diabetes and chronic kidney disease stage III who presents to the emergency room from skilled nursing facility due to chest pain.  Patient is not report chest pain currently and does not recall that he had chest pain however as per ER physician and emergency room notes he did have chest pain earlier this morning.  He was given 1 sublingual nitro at the nursing home and he arrived to the ER.  Patient does complain of a cough with greenish productive sputum.  Patient was hospitalized December 25 through January 8 for subdural hematoma after a fall and he also had left humeral surgical neck fracture that was operated on.  He is currently a resident at [facility].  He has been slowly progressing with physical therapy.  He has outpatient palliative care services.  Family is at bedside.  PAST MEDICAL HISTORY:   Past Medical History:  Diagnosis Date  . Bradycardia    a. during 02/2015 admission - occasional HR in 40s while being treated for atrial fib.  . Cervical disc disease   . Chronic diastolic CHF (congestive heart failure) ([place])    a. Dx 02/2015 -  2D echo 03/02/15 showed severe focal basal hypertrophy of the septum, EF 55-60%, mild MR, no effusion.  . CKD (chronic kidney disease), stage III ([place])   . Coronary artery calcification seen on CT scan    a. Nuc 02/2015 was normal.  . Dyslipidemia   . GERD (gastroesophageal reflux disease)   . Hypertension   . 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 PAF (paroxysmal atrial fibrillation) ([place])    a. Dx 02/2015 - in and out on tele, rx'd Coumadin and amiodarone.  . Pneumonia 2007  . Shortness of breath dyspnea    WITH EXERTION  . Type II diabetes mellitus ([place])     PAST SURGICAL HISTORY:   Past Surgical History:  Procedure Laterality Date  . CATARACT EXTRACTION W/ INTRAOCULAR LENS  IMPLANT, BILATERAL Bilateral   . CERVICAL DISC SURGERY     "i've had 3 neck ORs; not sure what kind; all thru the back of my neck"  . CHOLECYSTECTOMY    . COLONOSCOPY WITH PROPOFOL N/A 01/02/2016   Procedure: COLONOSCOPY WITH PROPOFOL;  Surgeon: [name], MD;  Location: [facility];  Service: Endoscopy;  Laterality: N/A;  . EYE SURGERY  1930's   "don't know what for" (05/11/2013)  . JOINT REPLACEMENT     bilateral knees, elbows, and shoulders (on 05/11/2013 pt denies all joint replacements"   . KNEE ARTHROPLASTY     "had one scope; one open knee OR; not sure which on which side" (05/10/2013)  . KNEE ARTHROSCOPY     "had one scope; one open knee OR; not sure which on which side" (05/10/2013)  . POSTERIOR FUSION CERVICAL SPINE    . REVERSE SHOULDER ARTHROPLASTY Left 05/19/2018   Procedure: REVERSE SHOULDER ARTHROPLASTY;  Surgeon: [name], MD;  Location: [facility];  Service: Orthopedics;  Laterality: Left;  . ROTATOR CUFF REPAIR Bilateral   . VIDEO BRONCHOSCOPY Bilateral 02/01/2016   Procedure: VIDEO BRONCHOSCOPY WITHOUT FLUORO;  Surgeon: Marshell [name], MD;  Location: [facility];  Service: Cardiopulmonary;  Laterality: Bilateral;    SOCIAL HISTORY:   Social History   Tobacco Use  . Smoking status: Never Smoker  . Smokeless tobacco: Former Systems developer    Types: Chew  Substance Use Topics  . Alcohol use: No    FAMILY HISTORY:   Family History  Problem Relation Age of Onset  . Diabetes Mellitus II Paternal Grandmother   . Heart disease Neg Hx        He does not know his father's history.   . Cancer Neg  Hx   . Diabetes Neg Hx   . CAD Neg Hx   . Lung disease Neg Hx   . Rheumatologic disease Neg Hx     DRUG ALLERGIES:   Allergies  Allergen Reactions  . 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 Patient subsequently taken off amiodarone.  . Nifedipine Other (See Comments)    Reaction: made gums swell up. Pt states that he had to have gum surgery after taking.     REVIEW OF SYSTEMS:   Review of Systems  Constitutional: Positive for malaise/fatigue. Negative for chills, fever and weight loss.  HENT: Negative.  Negative for ear discharge, ear pain, hearing loss, nosebleeds and sore throat.   Eyes: Negative.  Negative for blurred vision and pain.  Respiratory: Positive for cough, sputum production and shortness of breath. Negative for hemoptysis and wheezing.   Cardiovascular: Positive for chest pain and leg swelling. Negative for palpitations.  Gastrointestinal: Negative.  Negative for abdominal pain, blood in stool, diarrhea, nausea and vomiting.  Genitourinary: Negative.  Negative for dysuria.  Musculoskeletal: Negative.  Negative for back pain.  Skin: Negative.   Neurological: Negative for dizziness, tremors, speech change, focal weakness, seizures and headaches.  Endo/Heme/Allergies: Negative.  Does not bruise/bleed easily.  Psychiatric/Behavioral: Negative.  Negative for depression, hallucinations and suicidal ideas.    MEDICATIONS AT HOME:   Prior to Admission medications   Medication Sig Start Date End Date Taking? Authorizing Provider  LORazepam (ATIVAN) 0.5 MG tablet Take 0.5 mg by mouth daily. 05/27/18  Yes [provider]  oxyCODONE-acetaminophen (PERCOCET/ROXICET) 5-325 MG tablet Take 1 tablet by mouth every 6 (six) hours as needed for pain. 05/27/18  Yes [provider]  acetaminophen (TYLENOL) 325 MG tablet Take 650 mg by mouth every 6 (six) hours as  needed. 06/11/18   [provider]  albuterol (PROVENTIL) (2.5 MG/3ML) 0.083% nebulizer solution Take 2.5 mg by nebulization 4 (four) times daily as needed for wheezing or shortness of breath. 05/27/18   [provider]  atorvastatin (LIPITOR) 40 MG tablet Take 40 mg by mouth every evening.  05/29/18   [provider]  bisacodyl (DULCOLAX) 10 MG suppository Place 1 suppository (10 mg total) rectally daily as needed for moderate constipation. 05/27/18   Rai, Vernelle Emerald, MD  Cholecalciferol (VITAMIN D-3) 25 MCG (1000 UT) CAPS Take 1 capsule (1,000 Units total) by mouth daily. 05/27/18   Rai, Ripudeep Raliegh Ip, MD  colchicine 0.6 MG tablet Take 0.6 mg by mouth as needed.    [provider]  docusate sodium (COLACE) 100 MG capsule Take 1 capsule (100 mg total) by mouth 2 (two) times daily. 05/27/18  Rai, Ripudeep K, MD  febuxostat (ULORIC) 40 MG tablet Take 40 mg by mouth daily. 06/02/18   [provider]  furosemide (LASIX) 40 MG tablet Take 1 tablet (40 mg total) by mouth daily. 05/28/18   Rai, Vernelle Emerald, MD  hydrALAZINE (APRESOLINE) 25 MG tablet Take 1 tablet (25 mg total) by mouth every 8 (eight) hours. 05/27/18   Rai, Vernelle Emerald, MD  hydrocortisone cream 1 % Apply topically 2 (two) times daily. Apply to itchy areas on the left arm and chest 05/27/18   Rai, Ripudeep K, MD  insulin aspart (NOVOLOG FLEXPEN) 100 UNIT/ML FlexPen Inject 8 Units into the skin 3 (three) times daily with meals. 06/18/18   [provider]  Insulin Detemir (LEVEMIR FLEXTOUCH) 100 UNIT/ML Pen Inject 35 Units into the skin at bedtime. 06/18/18   [provider]  isosorbide mononitrate (IMDUR) 60 MG 24 hr tablet Take 1 tablet (60 mg total) by mouth daily. 05/27/18   Rai, Vernelle Emerald, MD  metoprolol (LOPRESSOR) 50 MG tablet Take 50 mg by mouth 2 (two) times daily.    [provider]  nitroGLYCERIN (NITROSTAT) 0.4 MG SL tablet Place 0.4 mg under the tongue as needed. 07/20/18   [provider]  NON FORMULARY Diet Type:  NAS, NCS 05/27/18   [provider]  omeprazole (PRILOSEC) 20 MG capsule Take 20 mg by mouth daily.  02/26/13   [provider]  OXYGEN Inhale 3 L/min into the lungs continuous. 05/27/18   [provider]  polyethylene glycol (MIRALAX / GLYCOLAX) packet Take 17 g by mouth daily as needed for mild constipation. 05/27/18   Rai, Vernelle Emerald, MD  sodium chloride (OCEAN) 0.65 % SOLN nasal spray Place 1 spray into both nostrils as needed for congestion. 05/27/18   Rai, Ripudeep K, MD  torsemide (DEMADEX) 20 MG tablet Take 20 mg by mouth 2 (two) times daily. 07/16/18   [provider]  vitamin B-12 (CYANOCOBALAMIN) 1000 MCG tablet Take 1 tablet (1,000 mcg total) by mouth daily. 05/27/18   Rai, Ripudeep K, MD      VITAL SIGNS:  Blood pressure (!) 148/90, pulse 66, temperature 98.6 F (37 C), temperature source Oral, resp. rate 18, height 5\' 9"  (1.753 m), weight 86 kg, SpO2 96 %.  PHYSICAL EXAMINATION:   Physical Exam Constitutional:      General: He is not in acute distress. HENT:     Head: Normocephalic.  Eyes:     General: No scleral icterus. Neck:     Musculoskeletal: Normal range of motion and neck supple.     Vascular: No JVD.     Trachea: No tracheal deviation.  Cardiovascular:     Rate and Rhythm: Normal rate and regular rhythm.     Heart sounds: Normal heart sounds. No murmur. No friction rub. No gallop.   Pulmonary:     Effort: Pulmonary effort is normal. No respiratory distress.     Breath sounds: Normal breath sounds. No wheezing or rales.  Chest:     Chest wall: No tenderness.  Abdominal:     General: Bowel sounds are normal. There is no distension.     Palpations: Abdomen is soft. There is no mass.     Tenderness: There is no abdominal tenderness. There is no guarding or rebound.  Musculoskeletal: Normal range of motion.     Right lower leg: Edema present.     Left lower leg: Edema present.  Skin:     General: Skin  is warm.     Findings: No erythema or rash.  Neurological:     Mental Status: He is alert.     Comments: Oriented to name and place not time  Psychiatric:        Judgment: Judgment normal.     Comments: Some mild confusion       LABORATORY PANEL:   CBC Recent Labs  Lab 07/20/18 1541  WBC 13.8*  HGB 10.8*  HCT 34.7*  PLT 177   ------------------------------------------------------------------------------------------------------------------  Chemistries  Recent Labs  Lab 07/20/18 1659  NA 138  K 3.2*  CL 97*  CO2 33*  GLUCOSE 320*  BUN 45*  CREATININE 1.63*  CALCIUM 7.9*  AST 20  ALT 19  ALKPHOS 96  BILITOT 0.7   ------------------------------------------------------------------------------------------------------------------  Cardiac Enzymes Recent Labs  Lab 07/20/18 1659  TROPONINI 0.12*   ------------------------------------------------------------------------------------------------------------------  RADIOLOGY:  Dg Chest 2 View  Result Date: 07/20/2018 CLINICAL DATA:  Productive cough.  Chest pain. EXAM: CHEST - 2 VIEW COMPARISON:  CT of the chest 07/16/2018. FINDINGS: The heart is mildly enlarged. Chronic left basilar airspace opacities are again noted. No superimposed disease is present. There is no edema or effusion. No focal airspace disease is present. Exaggerated kyphosis is again noted. Surgical clips are present at the gallbladder fossa. A left shoulder replacement is noted. IMPRESSION: 1. No acute cardiopulmonary disease or significant interval change. 2. Stable chronic opacities at the left base. Electronically Signed   By: San Morelle M.D.   On: 07/20/2018 16:01   Ct Head Wo Contrast  Result Date: 07/20/2018 CLINICAL DATA:  Productive cough.  Fever.  History of bleed. EXAM: CT HEAD WITHOUT CONTRAST TECHNIQUE: Contiguous axial images were obtained from the base of the skull through the vertex without intravenous contrast.  COMPARISON:  June 24, 2018 FINDINGS: Brain: No subdural, epidural, or subarachnoid hemorrhage. Ventricular prominence is out of proportion to mild sulcal prominence but stable. Cerebellum, brainstem, and basal cisterns are normal. No mass effect or midline shift. White matter changes. No acute cortical ischemia or infarct. Vascular: Calcified atherosclerosis is seen in the intracranial carotids. Skull: Normal. Negative for fracture or focal lesion. Sinuses/Orbits: There is fluid in left mastoid air cells, unchanged. The right mastoid air cells and middle ears are well aerated. No significant sinus abnormalities. Other: None. IMPRESSION: 1. No acute intracranial abnormality. 2. Ventricular prominence is out of proportion to sulcal prominence but is stable. This finding is nonspecific but could be seen with normal pressure hydrocephalus. 3. Fluid in left mastoid air cells, stable. Electronically Signed   By: Dorise Bullion III M.D   On: 07/20/2018 18:08    EKG:  Normal sinus rhythm no ST elevation or depression  IMPRESSION AND PLAN:   85 year old male with history of recent subdural in December after a fall, chronic diastolic heart failure and chronic kidney disease stage III who presents from nursing facility with chest pain.  1.  Chest pain with slightly elevated troponin: Elevation in troponin likely due to demand ischemia Due to subdural patient will not be placed on heparin or Lovenox Continue telemetry Continue monitoring troponins Consult cardiology if troponins continue to increase  2.  Acute on chronic hypoxic respiratory failure in the setting of probable pneumonia: Clinically patient presents with pneumonia Start cefepime Repeat chest x-ray in a.m. One-time dose of IV Lasix  3.  Acute on chronic diastolic heart failure: One-time dose of IV Lasix Continue torsemide  4.  Essential hypertension: Continue hydralazine, isosorbide  5.  Diabetes: Continue Levemir with sliding  scale  6.  Hyperlipidemia: Continue statin  7.  Recent subdural hematoma: Avoid heparin drip Okay to use Lovenox subcu  All the records are reviewed and case discussed with ED provider. Management plans discussed with the patient and family and they are in agreement  CODE STATUS: FULL  TOTAL TIME TAKING CARE OF THIS PATIENT: 44 minutes.    Beth Spackman M.D on 07/20/2018 at 6:40 PM  Between 7am to 6pm - Pager - [phone]  After 6pm go to [email or web address] - password [email or web address] [facility]  Office  [phone]  CC: Primary care physician; Lavone Orn, MD

## 2018-07-20 NOTE — Progress Notes (Signed)
   07/20/18 2100  Clinical Encounter Type  Visited With Patient not available  Visit Type Initial  Referral From Physician  Consult/Referral To Chaplain  Chaplain received OR for AD. Chaplain went to patient room and he was asleep. Nurse reception said he was being admitted and will be on 2A. Chaplain will pass on to on call Chaplain tomorrow to follow up.

## 2018-07-20 NOTE — Progress Notes (Signed)
Family Meeting Note  Advance Directive:no  Today a meeting took place with the Patient.spouse The following clinical team members were present during this meeting:MD  The following were discussed:Patient's diagnosis: chset pain CHF HCAP , Patient's progosis: Unable to determine and Goals for treatment: Full Code  Additional follow-up to be provided: continue outpatient PC services and chaplain to create AD  Time spent during discussion:17 minutes  Justin Leach, Ulice Bold, MD

## 2018-07-20 NOTE — ED Notes (Signed)
Patient placed in house bed. Patient was able to stand and pivit onto house bed.

## 2018-07-20 NOTE — ED Notes (Signed)
Repeat trop and flu swab sent.

## 2018-07-20 NOTE — Progress Notes (Signed)
Pharmacy Antibiotic Note  Justin Leach is a 85 y.o. male admitted on 07/20/2018 with pneumonia.  Pharmacy has been consulted for cefepime dosing.  Plan: Will order cefepime 2g q24H   Height: 5\' 9"  (175.3 cm) Weight: 189 lb 9.5 oz (86 kg) IBW/kg (Calculated) : 70.7  Temp (24hrs), Avg:98.6 F (37 C), Min:98.6 F (37 C), Max:98.6 F (37 C)  Recent Labs  Lab 07/20/18 1541 07/20/18 1659  WBC 13.8*  --   CREATININE  --  1.63*    Estimated Creatinine Clearance: 36.6 mL/min (A) (by C-G formula based on SCr of 1.63 mg/dL (H)).    Allergies  Allergen Reactions  . 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 Patient subsequently taken off amiodarone.  . Nifedipine Other (See Comments)    Reaction: made gums swell up. Pt states that he had to have gum surgery after taking.     Antimicrobials this admission: 3/2 cefepime >>    Dose adjustments this admission: Cefepime renally adjusted   Microbiology results: Will order a MRSA PCR  Thank you for allowing pharmacy to be a part of this patient's care.  Oswald Hillock, PharmD, BCPS Clinical Pharmacist  07/20/2018 6:47 PM

## 2018-07-21 ENCOUNTER — Inpatient Hospital Stay: Payer: Medicare Other

## 2018-07-21 LAB — GLUCOSE, CAPILLARY
GLUCOSE-CAPILLARY: 84 mg/dL (ref 70–99)
Glucose-Capillary: 32 mg/dL — CL (ref 70–99)
Glucose-Capillary: 149 mg/dL — ABNORMAL HIGH (ref 70–99)
Glucose-Capillary: 169 mg/dL — ABNORMAL HIGH (ref 70–99)
Glucose-Capillary: 245 mg/dL — ABNORMAL HIGH (ref 70–99)
Glucose-Capillary: 244 mg/dL — ABNORMAL HIGH (ref 70–99)

## 2018-07-21 LAB — HEMOGLOBIN A1C
Hgb A1c MFr Bld: 7.3 % — ABNORMAL HIGH (ref 4.8–5.6)
MEAN PLASMA GLUCOSE: 162.81 mg/dL

## 2018-07-21 LAB — BASIC METABOLIC PANEL
Anion gap: 5 (ref 5–15)
BUN: 44 mg/dL — ABNORMAL HIGH (ref 8–23)
CHLORIDE: 101 mmol/L (ref 98–111)
CO2: 34 mmol/L — ABNORMAL HIGH (ref 22–32)
Calcium: 7.9 mg/dL — ABNORMAL LOW (ref 8.9–10.3)
Creatinine, Ser: 1.63 mg/dL — ABNORMAL HIGH (ref 0.61–1.24)
GFR calc Af Amer: 44 mL/min — ABNORMAL LOW (ref >60)
GFR calc non Af Amer: 38 mL/min — ABNORMAL LOW (ref >60)
Glucose, Bld: 104 mg/dL — ABNORMAL HIGH (ref 70–99)
Potassium: 3.1 mmol/L — ABNORMAL LOW (ref 3.5–5.1)
SODIUM: 140 mmol/L (ref 135–145)

## 2018-07-21 LAB — PROCALCITONIN: Procalcitonin: 0.13 ng/mL

## 2018-07-21 LAB — MRSA PCR SCREENING: MRSA BY PCR: NEGATIVE

## 2018-07-21 LAB — TROPONIN I
Troponin I: 0.42 ng/mL (ref <0.03)
Troponin I: 0.46 ng/mL (ref <0.03)
Troponin I: 0.37 ng/mL (ref <0.03)

## 2018-07-21 LAB — MAGNESIUM: Magnesium: 1.7 mg/dL (ref 1.7–2.4)

## 2018-07-21 MED ORDER — INSULIN DETEMIR 100 UNIT/ML ~~LOC~~ SOLN
28.0000 [IU] | Freq: Every day | SUBCUTANEOUS | Status: DC
  Filled 2018-07-21: qty 0.28

## 2018-07-21 MED ORDER — SODIUM CHLORIDE 0.9 % IV SOLN
INTRAVENOUS | Status: DC | PRN
  Administered 2018-07-21: 500 mL via INTRAVENOUS

## 2018-07-21 MED ORDER — MAGNESIUM SULFATE IN D5W 1-5 GM/100ML-% IV SOLN
1.0000 g | Freq: Once | INTRAVENOUS | Status: AC
  Administered 2018-07-21: 1 g via INTRAVENOUS
  Filled 2018-07-21: qty 100

## 2018-07-21 MED ORDER — SODIUM CHLORIDE 0.9% FLUSH
3.0000 mL | Freq: Two times a day (BID) | INTRAVENOUS | Status: DC
  Administered 2018-07-21 – 2018-07-23 (×4): 3 mL via INTRAVENOUS

## 2018-07-21 MED ORDER — SODIUM CHLORIDE 0.9% FLUSH
3.0000 mL | Freq: Two times a day (BID) | INTRAVENOUS | Status: DC
  Administered 2018-07-21 – 2018-07-22 (×3): 3 mL via INTRAVENOUS

## 2018-07-21 MED ORDER — ASPIRIN 81 MG PO CHEW
81.0000 mg | CHEWABLE_TABLET | Freq: Every day | ORAL | Status: DC
  Administered 2018-07-21 – 2018-07-23 (×3): 81 mg via ORAL
  Filled 2018-07-21 (×3): qty 1

## 2018-07-21 MED ORDER — INSULIN DETEMIR 100 UNIT/ML ~~LOC~~ SOLN
20.0000 [IU] | Freq: Every day | SUBCUTANEOUS | Status: DC
  Administered 2018-07-21 – 2018-07-22 (×2): 20 [IU] via SUBCUTANEOUS
  Filled 2018-07-21 (×3): qty 0.2

## 2018-07-21 MED ORDER — POTASSIUM CHLORIDE CRYS ER 20 MEQ PO TBCR
40.0000 meq | EXTENDED_RELEASE_TABLET | Freq: Two times a day (BID) | ORAL | Status: DC
  Administered 2018-07-21 – 2018-07-23 (×5): 40 meq via ORAL
  Filled 2018-07-21 (×5): qty 2

## 2018-07-21 NOTE — ED Notes (Signed)
Pt given ginger ale again per request.

## 2018-07-21 NOTE — Progress Notes (Signed)
Patient admitted to 2A 232 from ED via bed.  Report called and given to this RN.  Patient oriented to floor and surroundings.  Telemetry box placed and called to central telemetry.

## 2018-07-21 NOTE — Clinical Social Work Note (Signed)
CSW spoke with [facility], and patient is long term care at [facility], not short term rehab.  CSW to continue to follow patient's progress throughout discharge planning.  Jones Broom. [name], MSW, [place]  07/21/2018 5:00 PM

## 2018-07-21 NOTE — Progress Notes (Signed)
   07/21/18 1100  Clinical Encounter Type  Visited With Patient  Visit Type Follow-up  Referral From Physician  Pt wants to discuss AD with his family. Ch gave a brief education, left the documentation with the pt and informed him how to reach out to chaplaincy service. Ch was responding to an OR requesting an AD education.

## 2018-07-21 NOTE — ED Notes (Signed)
Labs collected and sent to lab. Pt given a gingerale and a chair to set his drink in.

## 2018-07-21 NOTE — Progress Notes (Signed)
PT Cancellation Note  Patient Details Name: Justin Leach MRN: [id number] DOB: Jun 02, 1931   Cancelled Treatment:    Reason Eval/Treat Not Completed: Medical issues which prohibited therapy(Consult received and chart reviewed.  Patient noted with elevated troponin, trending up.  Will hold PT evaluation at this time until cleared for exertional activity (upcoming cardiology consult?))   Radiance Deady H. Owens Shark, PT, DPT, NCS 07/21/18, 10:44 AM [phone]

## 2018-07-21 NOTE — Care Management (Signed)
Patient is from [facility].   Notified Randall Hiss, LCSW

## 2018-07-21 NOTE — ED Notes (Signed)
MD updated for elevated troponin. Pt is unable to take heparin due to subdural. New order received.

## 2018-07-21 NOTE — ED Notes (Signed)
Patient is resting comfortably. 

## 2018-07-21 NOTE — Evaluation (Signed)
Physical Therapy Evaluation Patient Details Name: Justin Leach MRN: [id number] DOB: 1932-04-05 Today's Date: 07/21/2018   History of Present Illness  presented to ER from SNF due to chest pain; admitted for management of acute/chronic hypoxic respiratory failure due to PNA.  Also noted with elevated troponin (peak 0.46), trending down; likely demand ischemia per MD (no plans for cardiology intervention at this time)  Clinical Impression  Patient sleeping upon arrival to room, but awakens to voice.  Agreeable to session with mod encouragement from therapist and family.  Patient oriented to basic information; follows simple commands.  Generally weak and deconditioned throughout with significant strength/ROM limitations to L shoulder (recent reverse TSA 04/2018, no WBing or movement restrictions per Care Everywhere [facility] note from 2/13).  Currently requiring mod assist for bed mobility; min/mod assist +2 for sit/stand, basic transfers and very short-distance gait (5') with RW.  Slow, shuffling steps with poor step height/lenght, poor balance reactions.  Requries assist for L UE placement on RW and for balance/safety throughout transfer. Denies chest pain with exertion; vitals stable and WFL (does continue to require supplemental O2 to maintain sats >92%). Would benefit from skilled PT to address above deficits and promote optimal return to PLOF; recommend transition to STR upon discharge from acute hospitalization.     Follow Up Recommendations SNF    Equipment Recommendations       Recommendations for Other Services       Precautions / Restrictions Precautions Precautions: Fall Precaution Comments: Recent L reverse TSA (04/2018); no remaining precautions/restrictions Restrictions Weight Bearing Restrictions: No      Mobility  Bed Mobility Overal bed mobility: Needs Assistance Bed Mobility: Supine to Sit     Supine to sit: Mod assist     General bed mobility comments:  for truncal elevation; minimal/no active use of L UE With movement transition  Transfers Overall transfer level: Needs assistance Equipment used: Rolling walker (2 wheeled) Transfers: Sit to/from Stand Sit to Stand: Min assist;+2 physical assistance         General transfer comment: assist for L UE placement on RW, assist for lift off, standing balance  Ambulation/Gait Ambulation/Gait assistance: Min assist;+2 physical assistance Gait Distance (Feet): 5 Feet Assistive device: Rolling walker (2 wheeled)       General Gait Details: forward flexed posture, short, choppy steps; poor balance  Stairs            Wheelchair Mobility    Modified Rankin (Stroke Patients Only)       Balance Overall balance assessment: Needs assistance Sitting-balance support: No upper extremity supported;Feet supported Sitting balance-Leahy Scale: Good     Standing balance support: Bilateral upper extremity supported Standing balance-Leahy Scale: Poor                               Pertinent Vitals/Pain Pain Assessment: Faces Faces Pain Scale: Hurts little more Pain Location: L shoulder Pain Descriptors / Indicators: Aching;Grimacing Pain Intervention(s): Limited activity within patient's tolerance;Monitored during session;Repositioned    Home Living Family/patient expects to be discharged to:: Skilled nursing facility                      Prior Function Level of Independence: Needs assistance         Comments: Ambulatory for short-distances with RW, assist from facility staff for ADLs     Hand Dominance        Extremity/Trunk Assessment  Upper Extremity Assessment Upper Extremity Assessment: (L shoulder requiring act assist for elevation in all planes, tolerating only approx 30-40 degrees (limited by pain).  RUE grossly [facility])    Lower Extremity Assessment Lower Extremity Assessment: Generalized weakness(grossly 4-/5 throughout; generally  deconditioned)       Communication   Communication: HOH  Cognition Arousal/Alertness: Awake/alert Behavior During Therapy: WFL for tasks assessed/performed Overall Cognitive Status: Within Functional Limits for tasks assessed                                        General Comments      Exercises Other Exercises Other Exercises: Patient noted to be incontinent of urine upon arrival to room.  Min/mod assist for rolling bilat; dep assist for hygiene, clothing management and linen change   Assessment/Plan    PT Assessment Patient needs continued PT services  PT Problem List Decreased strength;Decreased range of motion;Decreased activity tolerance;Decreased balance;Decreased mobility;Decreased coordination;Decreased cognition;Decreased knowledge of use of DME;Decreased safety awareness;Decreased knowledge of precautions;Cardiopulmonary status limiting activity;Pain       PT Treatment Interventions DME instruction;Therapeutic activities;Balance training;Cognitive remediation;Gait training;Functional mobility training;Therapeutic exercise;Patient/family education    PT Goals (Current goals can be found in the Care Plan section)  Acute Rehab PT Goals Patient Stated Goal: per wife/family, to keep working on therapy while in the hospital so he can get stronger PT Goal Formulation: With patient/family Time For Goal Achievement: 08/04/18 Potential to Achieve Goals: Fair    Frequency Min 2X/week   Barriers to discharge        Co-evaluation               AM-PAC PT "6 Clicks" Mobility  Outcome Measure Help needed turning from your back to your side while in a flat bed without using bedrails?: A Lot Help needed moving from lying on your back to sitting on the side of a flat bed without using bedrails?: A Lot Help needed moving to and from a bed to a chair (including a wheelchair)?: A Lot Help needed standing up from a chair using your arms (e.g., wheelchair or  bedside chair)?: A Lot Help needed to walk in hospital room?: A Lot Help needed climbing 3-5 steps with a railing? : Total 6 Click Score: 11    End of Session Equipment Utilized During Treatment: Gait belt;Oxygen Activity Tolerance: Patient tolerated treatment well Patient left: in chair;with call bell/phone within reach;with chair alarm set;with family/visitor present Nurse Communication: Mobility status PT Visit Diagnosis: Unsteadiness on feet (R26.81);Difficulty in walking, not elsewhere classified (R26.2)    Time: 0254-2706 PT Time Calculation (min) (ACUTE ONLY): 29 min   Charges:   PT Evaluation $PT Eval Moderate Complexity: 1 Mod PT Treatments $Therapeutic Activity: 8-22 mins       Chynna Buerkle H. Owens Shark, PT, DPT, NCS 07/21/18, 11:07 PM [phone]

## 2018-07-21 NOTE — ED Notes (Signed)
Pt assisted with urinal

## 2018-07-21 NOTE — Progress Notes (Signed)
Inpatient Diabetes Program Recommendations  AACE/ADA: New Consensus Statement on Inpatient Glycemic Control   Target Ranges:  Prepandial:   less than 140 mg/dL      Peak postprandial:   less than 180 mg/dL (1-2 hours)      Critically ill patients:  140 - 180 mg/dL  Results for EULICE, RUTLEDGE (MRN [id number]) as of 07/21/2018 10:19  Ref. Range 07/20/2018 19:35 07/21/2018 09:03 07/21/2018 09:46  Glucose-Capillary Latest Ref Range: 70 - 99 mg/dL 359 (H)  Novolog 10 units  Levemir 35 units 32 (LL) 84  Results for AVISHAI, REIHL (MRN [id number]) as of 07/21/2018 10:19  Ref. Range 07/21/2018 04:37  Glucose Latest Ref Range: 70 - 99 mg/dL 104 (H)   Results for ALDIN, DREES (MRN [id number]) as of 07/21/2018 10:19  Ref. Range 07/20/2018 18:48  Hemoglobin A1C Latest Ref Range: 4.8 - 5.6 % 7.3 (H)   Review of Glycemic Control  Diabetes history: DM2 Outpatient Diabetes medications: Levemir 28 units QHS, Admelog 8 units TID with meals plus correction Current orders for Inpatient glycemic control: Levemir 35 units QHS, Novolog 0-9 units TID with meals, Novolog 8 units TID with meals for meal coverage  Inpatient Diabetes Program Recommendations:   Insulin - Basal: Noted glucose 25 mg/dl this morning at 9:03 am. Patient takes Levemir 28 units QHS at home. Patient ordered Levemir 35 units QHS and received Levemir 35 units last night. Please consider decreasing Lantus to 25 units QHS.  Insulin - Meal Coverage: Please discontinue Novolog 8 units TID with meals for now since patient hypoglycemic this morning and got Levemir 35 units last night.  HgbA1C: A1C 7.3% on 07/20/18 indicating an average glucose of 163 mg/dl over the past 2-3 months.  Thanks, Barnie Alderman, RN, MSN, CDE Diabetes Coordinator Inpatient Diabetes Program [phone] (Team Pager from 8am to 5pm)

## 2018-07-21 NOTE — Progress Notes (Addendum)
[place] [facility] [place] NAME: Justin Leach    MR#:  [id number]  DATE OF BIRTH:  [birth year]  SUBJECTIVE:   Patient states he is feeling a little bit better this morning.  He denies any chest pain.  He endorses shortness of breath.  He denies palpitations.  REVIEW OF SYSTEMS:  Review of Systems  Constitutional: Negative for chills and fever.  HENT: Negative for congestion and sore throat.   Eyes: Negative for blurred vision and double vision.  Respiratory: Positive for shortness of breath. Negative for cough.   Cardiovascular: Negative for chest pain, palpitations and leg swelling.  Gastrointestinal: Negative for nausea and vomiting.  Genitourinary: Negative for dysuria and urgency.  Musculoskeletal: Negative for back pain and neck pain.  Neurological: Negative for dizziness and headaches.  Psychiatric/Behavioral: Negative for depression. The patient is not nervous/anxious.     DRUG ALLERGIES:   Allergies  Allergen Reactions  . 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 Patient subsequently taken off amiodarone.  . Nifedipine Other (See Comments)    Reaction: made gums swell up. Pt states that he had to have gum surgery after taking.    VITALS:  Blood pressure (!) 147/43, pulse (!) 58, temperature 98.1 F (36.7 C), temperature source Oral, resp. rate 19, height 5\' 9"  (1.753 m), weight 86 kg, SpO2 93 %. PHYSICAL EXAMINATION:  Physical Exam  GENERAL:  Laying in the bed with no acute distress.  HEENT: Head atraumatic, normocephalic. Pupils equal, round, reactive to light and accommodation. No scleral icterus. Extraocular muscles intact. Oropharynx and nasopharynx clear.  NECK:  Supple, no jugular venous distention. No thyroid enlargement. LUNGS: + Diminished breath sounds throughout all lung fields.  Nasal cannula in place. No use of  accessory muscles of respiration.  CARDIOVASCULAR: S1, S2 normal. No murmurs, rubs, or gallops.  ABDOMEN: Soft, nontender, nondistended. Bowel sounds present.  EXTREMITIES: No cyanosis, or clubbing.  1+ pitting edema in the lower extremities bilaterally. NEUROLOGIC: CN 2-12 intact, no focal deficits. 5/5 muscle strength throughout all extremities. Sensation intact throughout. Gait not checked.  PSYCHIATRIC: The patient is alert and oriented x 3.  SKIN: No obvious rash, lesion, or ulcer.  LABORATORY PANEL:  Male CBC Recent Labs  Lab 07/20/18 1848  WBC 11.8*  HGB 10.4*  HCT 33.2*  PLT 149*   ------------------------------------------------------------------------------------------------------------------ Chemistries  Recent Labs  Lab 07/20/18 1659  07/21/18 0437 07/21/18 0729  NA 138  --  140  --   K 3.2*  --  3.1*  --   CL 97*  --  101  --   CO2 33*  --  34*  --   GLUCOSE 320*  --  104*  --   BUN 45*  --  44*  --   CREATININE 1.63*   < > 1.63*  --   CALCIUM 7.9*  --  7.9*  --   MG  --   --   --  1.7  AST 20  --   --   --   ALT 19  --   --   --   ALKPHOS 96  --   --   --   BILITOT 0.7  --   --   --    < > = values in this interval not displayed.   RADIOLOGY:  Dg Chest 1 View  Result Date: 07/21/2018 CLINICAL  DATA:  Pneumonia EXAM: CHEST  1 VIEW COMPARISON:  Yesterday FINDINGS: Mild cardiomegaly that is stable. Patchy density at the bases greatest on the left. There is history of pneumonia but there is also chronic lung disease as seen by chest CT 07/16/2018. No effusion or pneumothorax. No Kerley lines. IMPRESSION: Stable indistinct opacities at the bases. Electronically Signed   By: Monte Fantasia M.D.   On: 07/21/2018 04:58   Dg Chest 2 View  Result Date: 07/20/2018 CLINICAL DATA:  Productive cough.  Chest pain. EXAM: CHEST - 2 VIEW COMPARISON:  CT of the chest 07/16/2018. FINDINGS: The heart is mildly enlarged. Chronic left basilar airspace opacities are again noted. No  superimposed disease is present. There is no edema or effusion. No focal airspace disease is present. Exaggerated kyphosis is again noted. Surgical clips are present at the gallbladder fossa. A left shoulder replacement is noted. IMPRESSION: 1. No acute cardiopulmonary disease or significant interval change. 2. Stable chronic opacities at the left base. Electronically Signed   By: San Morelle M.D.   On: 07/20/2018 16:01   Ct Head Wo Contrast  Result Date: 07/20/2018 CLINICAL DATA:  Productive cough.  Fever.  History of bleed. EXAM: CT HEAD WITHOUT CONTRAST TECHNIQUE: Contiguous axial images were obtained from the base of the skull through the vertex without intravenous contrast. COMPARISON:  June 24, 2018 FINDINGS: Brain: No subdural, epidural, or subarachnoid hemorrhage. Ventricular prominence is out of proportion to mild sulcal prominence but stable. Cerebellum, brainstem, and basal cisterns are normal. No mass effect or midline shift. White matter changes. No acute cortical ischemia or infarct. Vascular: Calcified atherosclerosis is seen in the intracranial carotids. Skull: Normal. Negative for fracture or focal lesion. Sinuses/Orbits: There is fluid in left mastoid air cells, unchanged. The right mastoid air cells and middle ears are well aerated. No significant sinus abnormalities. Other: None. IMPRESSION: 1. No acute intracranial abnormality. 2. Ventricular prominence is out of proportion to sulcal prominence but is stable. This finding is nonspecific but could be seen with normal pressure hydrocephalus. 3. Fluid in left mastoid air cells, stable. Electronically Signed   By: Dorise Bullion III M.D   On: 07/20/2018 18:08   ASSESSMENT AND PLAN:   Elevated troponin-felt to be secondary to to demand ischemia.  Troponins peaked at 0.46.  Patient denies any active chest pain. -Monitor -No heparin due to recent subdural hematoma -Low threshold to consult cardiology if patient develops chest  pain -Needs to follow-up with cardiology as an outpatient  Acute on chronic hypoxic respiratory failure in the setting of probable HCAP-chest x-ray with bibasilar opacities.  Remains on 2 L O2 today. -Continue cefepime -Check procalcitonin -Wean O2 as able  Acute on chronic diastolic heart failure-patient appears mildly volume overloaded with 1+ lower extremity edema. -Received a one-time dose of IV Lasix on admission -Continue torsemide  Hypokalemia -Replete and recheck  Essential hypertension-BPs elevated -Continue hydralazine, isosorbide  Type II diabetes: Patient had a low blood sugar this morning -Decrease Levemir to 28 units daily and continue SSI -Stop NovoLog 3 times daily with meals  Hyperlipidemia: Continue statin  Recent subdural hematoma 04/2018 -SCDs  All the records are reviewed and case discussed with Care Management/Social Worker. Management plans discussed with the patient, family and they are in agreement.  CODE STATUS: Full Code  TOTAL TIME TAKING CARE OF THIS PATIENT: 40 minutes.   More than 50% of the time was spent in counseling/coordination of care: YES  POSSIBLE D/C IN 1-2 DAYS, DEPENDING  ON CLINICAL CONDITION.   Berna Spare Meng [place] M.D on 07/21/2018 at 1:13 PM  Between 7am to 6pm - Pager [phone]  After 6pm go to [email or web address] - Proofreader  [facility]  Office  [phone]  CC: Primary care physician; Lavone Orn, MD  Note: This dictation was prepared with Dragon dictation along with smaller phrase technology. Any transcriptional errors that result from this process are unintentional.

## 2018-07-21 NOTE — ED Notes (Signed)
ED TO INPATIENT HANDOFF REPORT  ED Nurse Name and Phone #: Kirke Shaggy 63  S Name/Age/Gender Justin Leach 85 y.o. male Room/Bed: ED15A/ED15A  Code Status   Code Status: Full Code  Home/SNF/Other Nursing Home Patient oriented to: self, place, person, time Is this baseline? Yes   Triage Complete: Triage complete  Chief Complaint Chest Pain  Triage Note Patient sent from [facility] with c/o CP. Patient given 1 nitro SL at Nursing and arrives to ED via EMS CP free. Denies pain at current time. Positive cough with greenish productive sputum.    Allergies Allergies  Allergen Reactions  . 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 Patient subsequently taken off amiodarone.  . Nifedipine Other (See Comments)    Reaction: made gums swell up. Pt states that he had to have gum surgery after taking.     Level of Care/Admitting Diagnosis ED Disposition    ED Disposition Condition [facility] Area: [place] [[id number]]  Level of Care: Telemetry [5]  Diagnosis: Pneumonia [818299]  Admitting Physician: Bettey Costa [[id number]]  Attending Physician: MODY, Ulice Bold [[id number]]  Estimated length of stay: past midnight tomorrow  Certification:: I certify this patient will need inpatient services for at least 2 midnights  PT Class (Do Not Modify): Inpatient [101]  PT Acc Code (Do Not Modify): Private [1]       B Medical/Surgery History Past Medical History:  Diagnosis Date  . Bradycardia    a. during 02/2015 admission - occasional HR in 40s while being treated for atrial fib.  . Cervical disc disease   . Chronic diastolic CHF (congestive heart failure) ([place])    a. Dx 02/2015 -  2D echo 03/02/15 showed severe focal basal hypertrophy of the septum, EF 55-60%, mild MR, no effusion.  . CKD (chronic kidney disease), stage III ([place])   . Coronary artery  calcification seen on CT scan    a. Nuc 02/2015 was normal.  . Dyslipidemia   . GERD (gastroesophageal reflux disease)   . Hypertension   . 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 PAF (paroxysmal atrial fibrillation) ([place])    a. Dx 02/2015 - in and out on tele, rx'd Coumadin and amiodarone.  . Pneumonia 2007  . Shortness of breath dyspnea    WITH EXERTION  . Type II diabetes mellitus ([place])    Past Surgical History:  Procedure Laterality Date  . CATARACT EXTRACTION W/ INTRAOCULAR LENS  IMPLANT, BILATERAL Bilateral   . CERVICAL DISC SURGERY     "i've had 3 neck ORs; not sure what kind; all thru the back of my neck"  . CHOLECYSTECTOMY    . COLONOSCOPY WITH PROPOFOL N/A 01/02/2016   Procedure: COLONOSCOPY WITH PROPOFOL;  Surgeon: [name], MD;  Location: [facility];  Service: Endoscopy;  Laterality: N/A;  . EYE SURGERY  1930's   "don't know what for" (05/11/2013)  . JOINT REPLACEMENT     bilateral knees, elbows, and shoulders (on 05/11/2013 pt denies all joint replacements"   . KNEE ARTHROPLASTY     "had one scope; one open knee OR; not sure which on which side" (05/10/2013)  . KNEE ARTHROSCOPY     "had one scope; one open knee OR; not sure which on which side" (05/10/2013)  . POSTERIOR FUSION CERVICAL SPINE    . REVERSE  SHOULDER ARTHROPLASTY Left 05/19/2018   Procedure: REVERSE SHOULDER ARTHROPLASTY;  Surgeon: [name], MD;  Location: [facility];  Service: Orthopedics;  Laterality: Left;  . ROTATOR CUFF REPAIR Bilateral   . VIDEO BRONCHOSCOPY Bilateral 02/01/2016   Procedure: VIDEO BRONCHOSCOPY WITHOUT FLUORO;  Surgeon: Marshell [name], MD;  Location: [facility];  Service: Cardiopulmonary;  Laterality: Bilateral;     A IV Location/Drains/Wounds Patient Lines/Drains/Airways Status   Active Line/Drains/Airways    Name:   Placement date:   Placement time:   Site:   Days:   Peripheral IV 05/24/18 Right Wrist   05/24/18     1030    Wrist   58   Peripheral IV 07/20/18 Right Forearm   07/20/18    1625    Forearm   1   Peripheral IV 07/20/18 Right Forearm   07/20/18    2017    Forearm   1   External Urinary Catheter   05/20/18    1930    -   62   Incision (Closed) 05/19/18 Arm Left   05/19/18    1511     63   Wound / Incision (Open or Dehisced) 05/14/18 Laceration Eye Left stiches   05/14/18    0000    Eye   68          Intake/Output Last 24 hours  Intake/Output Summary (Last 24 hours) at 07/21/2018 0727 Last data filed at 07/21/2018 0533 Gross per 24 hour  Intake 1100 ml  Output 700 ml  Net 400 ml    Labs/Imaging Results for orders placed or performed during the hospital encounter of 07/20/18 (from the past 48 hour(s))  CBC     Status: Abnormal   Collection Time: 07/20/18  3:41 PM  Result Value Ref Range   WBC 13.8 (H) 4.0 - 10.5 K/uL   RBC 3.67 (L) 4.22 - 5.81 MIL/uL   Hemoglobin 10.8 (L) 13.0 - 17.0 g/dL   HCT 34.7 (L) 39.0 - 52.0 %   MCV 94.6 80.0 - 100.0 fL   MCH 29.4 26.0 - 34.0 pg   MCHC 31.1 30.0 - 36.0 g/dL   RDW 15.0 11.5 - 15.5 %   Platelets 177 150 - 400 K/uL   nRBC 0.0 0.0 - 0.2 %    Comment: Performed at [facility], [address] [place], [place] [address]  Troponin I - ONCE - STAT     Status: Abnormal   Collection Time: 07/20/18  3:41 PM  Result Value Ref Range   Troponin I 0.10 (HH) <0.03 ng/mL    Comment: CRITICAL RESULT CALLED TO, READ BACK BY AND VERIFIED WITH jEANETTE PEREZ AT 3790 ON 07/20/2018 [place]. Performed at [facility], [address] [place], [place] [address]   Troponin I - Once-Timed     Status: Abnormal   Collection Time: 07/20/18  4:59 PM  Result Value Ref Range   Troponin I 0.12 (HH) <0.03 ng/mL    Comment: CRITICAL RESULT CALLED TO, READ BACK BY AND VERIFIED WITH RACHEL HAYDEN @1808  07/20/18 AKT Performed at [facility], [address] [place], [place] [address]   Influenza panel by PCR (type A & B)     Status: None    Collection Time: 07/20/18  4:59 PM  Result Value Ref Range   Influenza A By PCR NEGATIVE NEGATIVE   Influenza B By PCR NEGATIVE NEGATIVE    Comment: (NOTE) The Xpert Xpress Flu assay is intended as an aid  in the diagnosis of  influenza and should not be used as a sole basis for treatment.  This  assay is FDA approved for nasopharyngeal swab specimens only. Nasal  washings and aspirates are unacceptable for Xpert Xpress Flu testing. Performed at [facility], [address] [place], [place] [address]   Comprehensive metabolic panel     Status: Abnormal   Collection Time: 07/20/18  4:59 PM  Result Value Ref Range   Sodium 138 135 - 145 mmol/L   Potassium 3.2 (L) 3.5 - 5.1 mmol/L   Chloride 97 (L) 98 - 111 mmol/L   CO2 33 (H) 22 - 32 mmol/L   Glucose, Bld 320 (H) 70 - 99 mg/dL   BUN 45 (H) 8 - 23 mg/dL   Creatinine, Ser 1.63 (H) 0.61 - 1.24 mg/dL   Calcium 7.9 (L) 8.9 - 10.3 mg/dL   Total Protein 5.1 (L) 6.5 - 8.1 g/dL   Albumin 2.8 (L) 3.5 - 5.0 g/dL   AST 20 15 - 41 U/L   ALT 19 0 - 44 U/L   Alkaline Phosphatase 96 38 - 126 U/L   Total Bilirubin 0.7 0.3 - 1.2 mg/dL   GFR calc non Af Amer 38 (L) >60 mL/min   GFR calc Af Amer 44 (L) >60 mL/min   Anion gap 8 5 - 15    Comment: Performed at [facility], [address], [place], [place] [address]  Blood culture (routine x 2)     Status: None (Preliminary result)   Collection Time: 07/20/18  6:47 PM  Result Value Ref Range   Specimen Description BLOOD RIGHT ANTECUBITAL    Special Requests      BOTTLES DRAWN AEROBIC AND ANAEROBIC Blood Culture results may not be optimal due to an inadequate volume of blood received in culture bottles   Culture      NO GROWTH < 12 HOURS Performed at [facility], [address] [place], [place] [address]    Report Status PENDING   Blood culture (routine x 2)     Status: None (Preliminary result)   Collection Time: 07/20/18  6:47 PM  Result Value Ref Range    Specimen Description BLOOD BLOOD RIGHT HAND    Special Requests      BOTTLES DRAWN AEROBIC AND ANAEROBIC Blood Culture results may not be optimal due to an inadequate volume of blood received in culture bottles   Culture      NO GROWTH < 12 HOURS Performed at [facility], [address] [place], [place] [address]    Report Status PENDING   Lactic acid, plasma     Status: None   Collection Time: 07/20/18  6:48 PM  Result Value Ref Range   Lactic Acid, Venous 1.4 0.5 - 1.9 mmol/L    Comment: Performed at [facility], [address] [place], [place] [address]  Troponin I - Now Then Q6H     Status: Abnormal   Collection Time: 07/20/18  6:48 PM  Result Value Ref Range   Troponin I 0.22 (HH) <0.03 ng/mL    Comment: CRITICAL VALUE NOTED. VALUE IS CONSISTENT WITH PREVIOUSLY REPORTED/CALLED VALUE SMA Performed at [facility], [address] [place], [place] [address]   CBC     Status: Abnormal   Collection Time: 07/20/18  6:48 PM  Result Value Ref Range   WBC 11.8 (H) 4.0 - 10.5 K/uL   RBC 3.53 (L) 4.22 - 5.81 MIL/uL  Hemoglobin 10.4 (L) 13.0 - 17.0 g/dL   HCT 33.2 (L) 39.0 - 52.0 %   MCV 94.1 80.0 - 100.0 fL   MCH 29.5 26.0 - 34.0 pg   MCHC 31.3 30.0 - 36.0 g/dL   RDW 15.0 11.5 - 15.5 %   Platelets 149 (L) 150 - 400 K/uL   nRBC 0.0 0.0 - 0.2 %    Comment: Performed at [facility], [address] [place], [place] [address]  Creatinine, serum     Status: Abnormal   Collection Time: 07/20/18  6:48 PM  Result Value Ref Range   Creatinine, Ser 1.76 (H) 0.61 - 1.24 mg/dL   GFR calc non Af Amer 35 (L) >60 mL/min   GFR calc Af Amer 40 (L) >60 mL/min    Comment: Performed at [facility], [address] [place], [place] [address]  Glucose, capillary     Status: Abnormal   Collection Time: 07/20/18  7:35 PM  Result Value Ref Range   Glucose-Capillary 359 (H) 70 - 99 mg/dL  MRSA PCR Screening     Status: None   Collection Time:  07/21/18 12:21 AM  Result Value Ref Range   MRSA by PCR NEGATIVE NEGATIVE    Comment:        The GeneXpert MRSA Assay (FDA approved for NASAL specimens only), is one component of a comprehensive MRSA colonization surveillance program. It is not intended to diagnose MRSA infection nor to guide or monitor treatment for MRSA infections. Performed at [facility], [address] [place], [place] [address]   Troponin I - Now Then Q6H     Status: Abnormal   Collection Time: 07/21/18 12:34 AM  Result Value Ref Range   Troponin I 0.42 (HH) <0.03 ng/mL    Comment: CRITICAL VALUE NOTED. VALUE IS CONSISTENT WITH PREVIOUSLY REPORTED/CALLED VALUE SMA Performed at [facility], [address] [place], [place] [address]   Basic metabolic panel     Status: Abnormal   Collection Time: 07/21/18  4:37 AM  Result Value Ref Range   Sodium 140 135 - 145 mmol/L   Potassium 3.1 (L) 3.5 - 5.1 mmol/L   Chloride 101 98 - 111 mmol/L   CO2 34 (H) 22 - 32 mmol/L   Glucose, Bld 104 (H) 70 - 99 mg/dL   BUN 44 (H) 8 - 23 mg/dL   Creatinine, Ser 1.63 (H) 0.61 - 1.24 mg/dL   Calcium 7.9 (L) 8.9 - 10.3 mg/dL   GFR calc non Af Amer 38 (L) >60 mL/min   GFR calc Af Amer 44 (L) >60 mL/min   Anion gap 5 5 - 15    Comment: Performed at [facility], [address] [place], [place] [address]   Dg Chest 1 View  Result Date: 07/21/2018 CLINICAL DATA:  Pneumonia EXAM: CHEST  1 VIEW COMPARISON:  Yesterday FINDINGS: Mild cardiomegaly that is stable. Patchy density at the bases greatest on the left. There is history of pneumonia but there is also chronic lung disease as seen by chest CT 07/16/2018. No effusion or pneumothorax. No Kerley lines. IMPRESSION: Stable indistinct opacities at the bases. Electronically Signed   By: Monte Fantasia M.D.   On: 07/21/2018 04:58   Dg Chest 2 View  Result Date: 07/20/2018 CLINICAL DATA:  Productive cough.  Chest pain. EXAM: CHEST - 2 VIEW COMPARISON:   CT of the chest 07/16/2018. FINDINGS: The heart is mildly enlarged. Chronic left basilar airspace opacities are again  noted. No superimposed disease is present. There is no edema or effusion. No focal airspace disease is present. Exaggerated kyphosis is again noted. Surgical clips are present at the gallbladder fossa. A left shoulder replacement is noted. IMPRESSION: 1. No acute cardiopulmonary disease or significant interval change. 2. Stable chronic opacities at the left base. Electronically Signed   By: San Morelle M.D.   On: 07/20/2018 16:01   Ct Head Wo Contrast  Result Date: 07/20/2018 CLINICAL DATA:  Productive cough.  Fever.  History of bleed. EXAM: CT HEAD WITHOUT CONTRAST TECHNIQUE: Contiguous axial images were obtained from the base of the skull through the vertex without intravenous contrast. COMPARISON:  June 24, 2018 FINDINGS: Brain: No subdural, epidural, or subarachnoid hemorrhage. Ventricular prominence is out of proportion to mild sulcal prominence but stable. Cerebellum, brainstem, and basal cisterns are normal. No mass effect or midline shift. White matter changes. No acute cortical ischemia or infarct. Vascular: Calcified atherosclerosis is seen in the intracranial carotids. Skull: Normal. Negative for fracture or focal lesion. Sinuses/Orbits: There is fluid in left mastoid air cells, unchanged. The right mastoid air cells and middle ears are well aerated. No significant sinus abnormalities. Other: None. IMPRESSION: 1. No acute intracranial abnormality. 2. Ventricular prominence is out of proportion to sulcal prominence but is stable. This finding is nonspecific but could be seen with normal pressure hydrocephalus. 3. Fluid in left mastoid air cells, stable. Electronically Signed   By: Dorise Bullion III M.D   On: 07/20/2018 18:08    Pending Labs Unresulted Labs (From admission, onward)    Start     Ordered   07/27/18 0500  Creatinine, serum  (enoxaparin (LOVENOX)    CrCl  >/= 30 ml/min)  Weekly,   STAT    Comments:  while on enoxaparin therapy    07/20/18 1839   07/21/18 1000  Troponin I - Once  Once,   STAT     07/21/18 0339   07/21/18 0714  Magnesium  Add-on,   AD     07/21/18 0713   07/21/18 0700  Troponin I - Once  Once,   STAT     07/21/18 0339   07/20/18 1838  Hemoglobin A1c  Once,   STAT     07/20/18 1839   07/20/18 1811  Lactic acid, plasma  Now then every 2 hours,   STAT     07/20/18 1810          Vitals/Pain Today's Vitals   07/21/18 0400 07/21/18 0430 07/21/18 0515 07/21/18 0600  BP: (!) 153/43 (!) 153/43  (!) 161/49  Pulse:      Resp: 19 20 17 18   Temp:      TempSrc:      SpO2:      Weight:      Height:      PainSc:        Isolation Precautions Droplet precaution  Medications Medications  pantoprazole (PROTONIX) EC tablet 40 mg (has no administration in time range)  atorvastatin (LIPITOR) tablet 40 mg (40 mg Oral Given 07/20/18 2032)  metoprolol tartrate (LOPRESSOR) tablet 50 mg (50 mg Oral Given 07/20/18 2219)  furosemide (LASIX) tablet 40 mg (has no administration in time range)  hydrALAZINE (APRESOLINE) tablet 25 mg (25 mg Oral Given 07/21/18 0651)  isosorbide mononitrate (IMDUR) 24 hr tablet 60 mg (has no administration in time range)  docusate sodium (COLACE) capsule 100 mg (100 mg Oral Given 07/20/18 2219)  bisacodyl (DULCOLAX) suppository 10 mg (has no administration  in time range)  polyethylene glycol (MIRALAX / GLYCOLAX) packet 17 g (has no administration in time range)  cholecalciferol (VITAMIN D3) tablet 1,000 Units (has no administration in time range)  vitamin B-12 (CYANOCOBALAMIN) tablet 1,000 mcg (1,000 mcg Oral Not Given 07/20/18 1845)  febuxostat (ULORIC) tablet 40 mg (40 mg Oral Not Given 07/20/18 1845)  insulin aspart (novoLOG) injection 8 Units (has no administration in time range)  insulin detemir (LEVEMIR) injection 35 Units (35 Units Subcutaneous Given 07/20/18 2222)  torsemide (DEMADEX) tablet 20 mg (20 mg Oral  Not Given 07/20/18 2200)  LORazepam (ATIVAN) tablet 0.5 mg (has no administration in time range)  ipratropium-albuterol (DUONEB) 0.5-2.5 (3) MG/3ML nebulizer solution 3 mL (3 mLs Nebulization Given 07/21/18 0438)  insulin aspart (novoLOG) injection 0-9 Units (has no administration in time range)  enoxaparin (LOVENOX) injection 40 mg (40 mg Subcutaneous Given 07/20/18 1933)  acetaminophen (TYLENOL) tablet 650 mg (has no administration in time range)    Or  acetaminophen (TYLENOL) suppository 650 mg (has no administration in time range)  sodium chloride flush (NS) 0.9 % injection 3 mL (3 mLs Intravenous Given 07/20/18 2207)  sodium chloride flush (NS) 0.9 % injection 3 mL (has no administration in time range)  0.9 %  sodium chloride infusion (has no administration in time range)  HYDROcodone-acetaminophen (NORCO/VICODIN) 5-325 MG per tablet 1 tablet (has no administration in time range)  ondansetron (ZOFRAN) tablet 4 mg (has no administration in time range)    Or  ondansetron (ZOFRAN) injection 4 mg (has no administration in time range)  ceFEPIme (MAXIPIME) 2 g in sodium chloride 0.9 % 100 mL IVPB (0 g Intravenous Stopped 07/20/18 2207)  aspirin chewable tablet 81 mg (81 mg Oral Given 07/21/18 0437)  potassium chloride SA (K-DUR,KLOR-CON) CR tablet 40 mEq (40 mEq Oral Given 07/20/18 1932)  insulin aspart (novoLOG) injection 10 Units (10 Units Intravenous Given 07/20/18 1937)  sodium chloride 0.9 % bolus 1,000 mL (0 mLs Intravenous Stopped 07/20/18 2206)  furosemide (LASIX) injection 40 mg (40 mg Intravenous Given 07/20/18 1932)    Mobility power wheelchair High fall risk   Focused Assessments Cardiac Assessment Handoff:  Cardiac Rhythm: Normal sinus rhythm Lab Results  Component Value Date   CKTOTAL 215 05/13/2018   TROPONINI 0.42 (HH) 07/21/2018   Lab Results  Component Value Date   DDIMER 0.86 (H) 02/28/2015   Does the Patient currently have chest pain? No     R Recommendations: See  Admitting Provider Note  Report given to:   Additional Notes:

## 2018-07-21 NOTE — Consult Note (Signed)
PHARMACY CONSULT NOTE - FOLLOW UP  Pharmacy Consult for Electrolyte Monitoring and Replacement   Recent Labs: Potassium (mmol/L)  Date Value  07/21/2018 3.1 (L)   Magnesium (mg/dL)  Date Value  07/21/2018 1.7   Calcium (mg/dL)  Date Value  07/21/2018 7.9 (L)   Albumin (g/dL)  Date Value  07/20/2018 2.8 (L)   Sodium (mmol/L)  Date Value  07/21/2018 140   Assessment: 85 y.o. male admitted on 07/20/2018 with pneumonia. Labs on admission revealed hypokalemia, which was replaced with oral KCl 40 mEq   Goal of Therapy:  Electrolytes wnl  Plan:  Dr Brett Albino has ordered a total of 15mEq KCL for today. Pharmacy will order f/u labs tomorrow morning and replace as needed.  Dallie Piles ,PharmD Clinical Pharmacist 07/21/2018 10:05 AM

## 2018-07-22 ENCOUNTER — Ambulatory Visit: Payer: Medicare Other

## 2018-07-22 LAB — CBC
HEMATOCRIT: 31.5 % — AB (ref 39.0–52.0)
HEMOGLOBIN: 9.8 g/dL — AB (ref 13.0–17.0)
MCH: 29 pg (ref 26.0–34.0)
MCHC: 31.1 g/dL (ref 30.0–36.0)
MCV: 93.2 fL (ref 80.0–100.0)
Platelets: 213 10*3/uL (ref 150–400)
RBC: 3.38 MIL/uL — ABNORMAL LOW (ref 4.22–5.81)
RDW: 14.8 % (ref 11.5–15.5)
WBC: 10.1 10*3/uL (ref 4.0–10.5)
nRBC: 0 % (ref 0.0–0.2)

## 2018-07-22 LAB — BASIC METABOLIC PANEL
Anion gap: 8 (ref 5–15)
BUN: 39 mg/dL — ABNORMAL HIGH (ref 8–23)
CHLORIDE: 99 mmol/L (ref 98–111)
CO2: 33 mmol/L — AB (ref 22–32)
Calcium: 8.6 mg/dL — ABNORMAL LOW (ref 8.9–10.3)
Creatinine, Ser: 1.75 mg/dL — ABNORMAL HIGH (ref 0.61–1.24)
GFR calc Af Amer: 41 mL/min — ABNORMAL LOW (ref >60)
GFR calc non Af Amer: 35 mL/min — ABNORMAL LOW (ref >60)
GLUCOSE: 190 mg/dL — AB (ref 70–99)
Potassium: 4 mmol/L (ref 3.5–5.1)
Sodium: 140 mmol/L (ref 135–145)

## 2018-07-22 LAB — GLUCOSE, CAPILLARY
Glucose-Capillary: 188 mg/dL — ABNORMAL HIGH (ref 70–99)
Glucose-Capillary: 125 mg/dL — ABNORMAL HIGH (ref 70–99)
Glucose-Capillary: 219 mg/dL — ABNORMAL HIGH (ref 70–99)
Glucose-Capillary: 125 mg/dL — ABNORMAL HIGH (ref 70–99)
Glucose-Capillary: 245 mg/dL — ABNORMAL HIGH (ref 70–99)

## 2018-07-22 MED ORDER — IPRATROPIUM-ALBUTEROL 0.5-2.5 (3) MG/3ML IN SOLN
3.0000 mL | Freq: Three times a day (TID) | RESPIRATORY_TRACT | Status: DC
  Administered 2018-07-23 (×2): 3 mL via RESPIRATORY_TRACT
  Filled 2018-07-22 (×3): qty 3

## 2018-07-22 MED ORDER — CARBAMIDE PEROXIDE 6.5 % OT SOLN
5.0000 [drp] | Freq: Two times a day (BID) | OTIC | Status: DC
  Administered 2018-07-22 – 2018-07-23 (×3): 5 [drp] via OTIC
  Filled 2018-07-22: qty 15

## 2018-07-22 NOTE — Progress Notes (Signed)
Bedtime cbg 245. No orders for bedtime insulin. MD Jannifer Franklin notified. Per MD Jannifer Franklin, patients blood sugar dropped in previous AM after receiving dose of levemir at bedtime. MD to decrease Levemir to 20 units and recheck CBG at 0300 . Will continue to monitor.

## 2018-07-22 NOTE — Plan of Care (Signed)
  Problem: Clinical Measurements: Goal: Ability to maintain clinical measurements within normal limits will improve Outcome: Progressing   Problem: Activity: Goal: Risk for activity intolerance will decrease Outcome: Progressing   Problem: Safety: Goal: Ability to remain free from injury will improve Outcome: Progressing   Problem: Cardiac: Goal: Ability to achieve and maintain adequate cardiovascular perfusion will improve Outcome: Progressing

## 2018-07-22 NOTE — Consult Note (Signed)
PHARMACY CONSULT NOTE - FOLLOW UP  Pharmacy Consult for Electrolyte Monitoring and Replacement   Recent Labs: Potassium (mmol/L)  Date Value  07/22/2018 4.0   Magnesium (mg/dL)  Date Value  07/21/2018 1.7   Calcium (mg/dL)  Date Value  07/22/2018 8.6 (L)   Albumin (g/dL)  Date Value  07/20/2018 2.8 (L)   Sodium (mmol/L)  Date Value  07/22/2018 140   Assessment: 85 y.o. male admitted on 07/20/2018 with pneumonia. Labs on admission revealed hypokalemia, which was replaced with oral KCl 40 mEq   Goal of Therapy:  Electrolytes wnl  Plan:  Will continue Potassium Chloride 40 mEq PO BID while patient is taking Lasix 40mg  PO daily.  Pharmacy will order f/u labs tomorrow morning and replace as needed.  Pearla Dubonnet ,PharmD Clinical Pharmacist 07/22/2018 9:48 AM

## 2018-07-22 NOTE — Progress Notes (Signed)
Per Respiratory Protocol Assessment, Patient scored Level 2, Treatment frequency changed from Q4 to TID. Patient states he prefers to rest at night.

## 2018-07-22 NOTE — Progress Notes (Signed)
Inpatient Diabetes Program Recommendations  AACE/ADA: New Consensus Statement on Inpatient Glycemic Control (2015)  Target Ranges:  Prepandial:   less than 140 mg/dL      Peak postprandial:   less than 180 mg/dL (1-2 hours)      Critically ill patients:  140 - 180 mg/dL   Lab Results  Component Value Date   GLUCAP 219 (H) 07/22/2018   HGBA1C 7.3 (H) 07/20/2018    Review of Glycemic Control Results for Justin Leach, Justin Leach (MRN [id number]) as of 07/22/2018 12:08  Ref. Range 07/21/2018 21:08 07/21/2018 22:21 07/22/2018 04:06 07/22/2018 08:21 07/22/2018 12:00  Glucose-Capillary Latest Ref Range: 70 - 99 mg/dL 245 (H) 244 (H) 188 (H) 125 (H) 219 (H)  Diabetes history: DM2 Outpatient Diabetes medications: Levemir 28 units QHS, Admelog 8 units TID with meals plus correction Current orders for Inpatient glycemic control: Levemir 20 units QHS, Novolog 0-9 units TID with meals  Inpatient Diabetes Program Recommendations:  Blood sugars improved this AM with reduction in Levemir.  CBG increased at lunch.  May consider resuming a portion of Novolog meal coverage.  Consider Novolog 3 units tid with meals (hold if patient eats less than 50%).  Thanks  Adah Perl, RN, BC-ADM Inpatient Diabetes Coordinator Pager [phone] (8a-5p)

## 2018-07-22 NOTE — Progress Notes (Signed)
Patient is stable this shift ,no changes

## 2018-07-22 NOTE — Progress Notes (Signed)
[place] [facility] [place] NAME: Justin Leach    MR#:  [id number]  DATE OF BIRTH:  1931/09/30  SUBJECTIVE:   Patient states he is feeling well this morning.  He has no concerns, although he does note that his breathing is not quite where it typically is.  He denies any shortness of breath at rest.  He denies any chest pain.  REVIEW OF SYSTEMS:  Review of Systems  Constitutional: Negative for chills and fever.  HENT: Negative for congestion and sore throat.   Eyes: Negative for blurred vision and double vision.  Respiratory: Positive for shortness of breath. Negative for cough.   Cardiovascular: Negative for chest pain, palpitations and leg swelling.  Gastrointestinal: Negative for nausea and vomiting.  Genitourinary: Negative for dysuria and urgency.  Musculoskeletal: Negative for back pain and neck pain.  Neurological: Negative for dizziness and headaches.  Psychiatric/Behavioral: Negative for depression. The patient is not nervous/anxious.     DRUG ALLERGIES:   Allergies  Allergen Reactions  . 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 Patient subsequently taken off amiodarone.  . Nifedipine Other (See Comments)    Reaction: made gums swell up. Pt states that he had to have gum surgery after taking.    VITALS:  Blood pressure (!) 133/39, pulse 65, temperature 98.6 F (37 C), temperature source Oral, resp. rate 20, height 5\' 9"  (1.753 m), weight 86 kg, SpO2 96 %. PHYSICAL EXAMINATION:  Physical Exam  GENERAL:  Laying in the bed with no acute distress.  HEENT: Head atraumatic, normocephalic. Pupils equal, round, reactive to light and accommodation. No scleral icterus. Extraocular muscles intact. Oropharynx and nasopharynx clear.  NECK:  Supple, no jugular venous distention. No thyroid enlargement. LUNGS: + Diminished breath sounds  throughout all lung fields.  Nasal cannula in place. No use of accessory muscles of respiration.  CARDIOVASCULAR: RRR, S1, S2 normal. No murmurs, rubs, or gallops.  ABDOMEN: Soft, nontender, nondistended. Bowel sounds present.  EXTREMITIES: No cyanosis, or clubbing.  1+ pitting edema in the lower extremities bilaterally. NEUROLOGIC: CN 2-12 intact, no focal deficits. 5/5 muscle strength throughout all extremities. Sensation intact throughout. Gait not checked.  PSYCHIATRIC: The patient is alert and oriented x 3.  SKIN: No obvious rash, lesion, or ulcer.  LABORATORY PANEL:  Male CBC Recent Labs  Lab 07/22/18 0517  WBC 10.1  HGB 9.8*  HCT 31.5*  PLT 213   ------------------------------------------------------------------------------------------------------------------ Chemistries  Recent Labs  Lab 07/20/18 1659  07/21/18 0729 07/22/18 0517  NA 138   < >  --  140  K 3.2*   < >  --  4.0  CL 97*   < >  --  99  CO2 33*   < >  --  33*  GLUCOSE 320*   < >  --  190*  BUN 45*   < >  --  39*  CREATININE 1.63*   < >  --  1.75*  CALCIUM 7.9*   < >  --  8.6*  MG  --   --  1.7  --   AST 20  --   --   --   ALT 19  --   --   --   ALKPHOS 96  --   --   --   BILITOT 0.7  --   --   --    < > =  values in this interval not displayed.   RADIOLOGY:  No results found. ASSESSMENT AND PLAN:   Elevated troponin- felt to be secondary to to demand ischemia.  Troponins peaked at 0.46.  Patient denies any active chest pain. -Monitor -No heparin due to recent subdural hematoma -Low threshold to consult cardiology if patient develops chest pain -Needs to follow-up with cardiology as an outpatient  Acute on chronic hypoxic respiratory failure in the setting of probable HCAP-chest x-ray with bibasilar opacities.  PCT mildly elevated to 0.13. remains on 2 L O2 today. -Continue cefepime, can potentially change to p.o. antibiotics tomorrow -Wean O2 as able  Acute on chronic diastolic heart failure-  patient appears mildly volume overloaded with 1+ lower extremity edema. -Continue home torsemide  Essential hypertension-BPs mildly elevated -Continue hydralazine, isosorbide  Type II diabetes: Blood sugars mildly elevated -Continue Levemir 28 units daily and SSI  Hyperlipidemia: Continue statin  Recent subdural hematoma 04/2018 -SCDs  All the records are reviewed and case discussed with Care Management/Social Worker. Management plans discussed with the patient, family and they are in agreement.  CODE STATUS: Full Code  TOTAL TIME TAKING CARE OF THIS PATIENT: 40 minutes.   More than 50% of the time was spent in counseling/coordination of care: YES  POSSIBLE D/C tomorrow, DEPENDING ON CLINICAL CONDITION.   Berna Spare Mayo M.D on 07/22/2018 at 2:11 PM  Between 7am to 6pm - Pager [phone]  After 6pm go to [email or web address] - Proofreader  [facility]  Hospitalists  Office  [phone]  CC: Primary care physician; Lavone Orn, MD  Note: This dictation was prepared with Dragon dictation along with smaller phrase technology. Any transcriptional errors that result from this process are unintentional.

## 2018-07-22 NOTE — Progress Notes (Signed)
Pleased note, patient is currently followed by outpatient Palliative at [facility]. CSW Evette Cristal made aware. Flo Shanks BSN, RN, [facility] [phone]

## 2018-07-23 LAB — BASIC METABOLIC PANEL
ANION GAP: 9 (ref 5–15)
BUN: 40 mg/dL — ABNORMAL HIGH (ref 8–23)
CO2: 37 mmol/L — ABNORMAL HIGH (ref 22–32)
Calcium: 9 mg/dL (ref 8.9–10.3)
Chloride: 95 mmol/L — ABNORMAL LOW (ref 98–111)
Creatinine, Ser: 1.82 mg/dL — ABNORMAL HIGH (ref 0.61–1.24)
GFR calc Af Amer: 39 mL/min — ABNORMAL LOW (ref >60)
GFR calc non Af Amer: 33 mL/min — ABNORMAL LOW (ref >60)
Glucose, Bld: 186 mg/dL — ABNORMAL HIGH (ref 70–99)
Potassium: 4.8 mmol/L (ref 3.5–5.1)
SODIUM: 141 mmol/L (ref 135–145)

## 2018-07-23 LAB — GLUCOSE, CAPILLARY
GLUCOSE-CAPILLARY: 115 mg/dL — AB (ref 70–99)
GLUCOSE-CAPILLARY: 239 mg/dL — AB (ref 70–99)

## 2018-07-23 LAB — PHOSPHORUS: Phosphorus: 2.6 mg/dL (ref 2.5–4.6)

## 2018-07-23 LAB — MAGNESIUM: MAGNESIUM: 1.7 mg/dL (ref 1.7–2.4)

## 2018-07-23 MED ORDER — AMOXICILLIN-POT CLAVULANATE 500-125 MG PO TABS
1.0000 | ORAL_TABLET | Freq: Two times a day (BID) | ORAL | Status: DC
  Administered 2018-07-23: 500 mg via ORAL
  Filled 2018-07-23 (×2): qty 1

## 2018-07-23 MED ORDER — POTASSIUM CHLORIDE CRYS ER 20 MEQ PO TBCR: 20.0000 meq | EXTENDED_RELEASE_TABLET | Freq: Every day | ORAL | 0 refills | Status: DC

## 2018-07-23 MED ORDER — ISOSORBIDE MONONITRATE ER 60 MG PO TB24: 60.0000 mg | ORAL_TABLET | Freq: Every day | ORAL | 0 refills | Status: AC

## 2018-07-23 MED ORDER — CARBAMIDE PEROXIDE 6.5 % OT SOLN: 5.0000 [drp] | Freq: Two times a day (BID) | OTIC | 0 refills | Status: DC

## 2018-07-23 MED ORDER — OXYCODONE-ACETAMINOPHEN 5-325 MG PO TABS: 1.0000 | ORAL_TABLET | Freq: Four times a day (QID) | ORAL | 0 refills | Status: DC | PRN

## 2018-07-23 MED ORDER — INSULIN DETEMIR 100 UNIT/ML FLEXPEN: 18.0000 [IU] | PEN_INJECTOR | Freq: Every day | SUBCUTANEOUS | 11 refills | Status: DC

## 2018-07-23 MED ORDER — ASPIRIN 81 MG PO CHEW: 81.0000 mg | CHEWABLE_TABLET | Freq: Every day | ORAL | 0 refills | Status: AC

## 2018-07-23 MED ORDER — AMOXICILLIN-POT CLAVULANATE 500-125 MG PO TABS: 1.0000 | ORAL_TABLET | Freq: Two times a day (BID) | ORAL | 0 refills | Status: DC

## 2018-07-23 NOTE — NC FL2 (Signed)
[place] LEVEL OF CARE SCREENING TOOL     IDENTIFICATION  Patient Name: Justin Leach Birthdate: June 29, 1931 Sex: male Admission Date (Current Location): 07/20/2018  [place] and [place] Number:  Engineering geologist and Address:  [facility], [address] [place],  [address]      Provider Number: [id number]  Attending Physician Name and Address:  Loletha Grayer, MD  Relative Name and Phone Number:  Aking, Klabunde [phone] or Susa Simmonds   [phone]     Current Level of Care: Hospital Recommended Level of Care: [place] Prior Approval Number:    Date Approved/Denied:   PASRR Number: [id number]  Discharge Plan: SNF    Current Diagnoses: Patient Active Problem List   Diagnosis Date Noted  . Pneumonia 07/20/2018  . Palliative care encounter 06/26/2018  . Weakness generalized 06/26/2018  . HCAP (healthcare-associated pneumonia) 06/22/2018  . Gouty arthritis of right great toe 06/05/2018  . Hypertensive heart and kidney disease with chronic diastolic congestive heart failure and stage 3 chronic kidney disease ([place]) 06/01/2018  . Dyslipidemia associated with type 2 diabetes mellitus ([place]) 06/01/2018  . Chronic constipation 06/01/2018  . CKD stage 3 due to type 2 diabetes mellitus ([place]) 06/01/2018  . Volume overload 05/20/2018  . Fall 05/13/2018  . SDH (subdural hematoma) ([place]) 05/13/2018  . Closed comminuted left humeral fracture 05/13/2018  . Hyperkalemia 05/13/2018  . Leukocytosis 05/13/2018  . Fall at home   . Subdural hematoma without coma ([place])   . Cough   . GERD (gastroesophageal reflux disease)   . Pulmonary fibrosis ([place])   . Coronary artery calcification seen on CT scan   . Chronic diastolic CHF (congestive heart failure) ([place])   . PAF (paroxysmal atrial fibrillation) ([place])   . CKD (chronic kidney disease), stage III ([place])   . Type II diabetes  mellitus with renal manifestations ([place])   . Essential hypertension   . Chest pain 02/28/2015  . Renal failure (ARF), acute on chronic ([place]) 02/28/2015  . Hyperlipidemia 02/28/2015  . Dyspnea 03/29/2014  . DOE (dyspnea on exertion) 03/29/2014  . Bradycardia 03/29/2014  . Bronchospasm 05/15/2013    Orientation RESPIRATION BLADDER Height & Weight     Self  Normal Continent Weight: (Pt refused to stand for wt.) Height:  5\' 9"  (175.3 cm)  BEHAVIORAL SYMPTOMS/MOOD NEUROLOGICAL BOWEL NUTRITION STATUS      Continent Diet(Carb modified)  AMBULATORY STATUS COMMUNICATION OF NEEDS Skin   Limited Assist Verbally Normal                       Personal Care Assistance Level of Assistance  Bathing, Feeding, Dressing Bathing Assistance: Limited assistance Feeding assistance: Independent Dressing Assistance: Limited assistance     Functional Limitations Info  Sight, Hearing, Speech Sight Info: Adequate Hearing Info: Adequate Speech Info: Adequate    SPECIAL CARE FACTORS FREQUENCY  PT (By licensed PT), OT (By licensed OT)     PT Frequency: 5x a week OT Frequency: 5x a week            Contractures Contractures Info: Not present    Additional Factors Info  Code Status, Allergies, Insulin Sliding Scale, Psychotropic Code Status Info: Full Code Allergies Info: AMIODARONE, NIFEDIPINE Psychotropic Info: LORazepam (ATIVAN) tablet 0.5 mg  Insulin Sliding Scale Info: insulin aspart (novoLOG) injection 0-9 Units 3x a day with meals       Current Medications (07/23/2018):  This is the current  hospital active medication list Current Facility-Administered Medications  Medication Dose Route Frequency Provider Last Rate Last Dose  . 0.9 %  sodium chloride infusion  250 mL Intravenous PRN Mody, Sital, MD      . 0.9 %  sodium chloride infusion   Intravenous PRN Mayo, Pete Pelt, MD 10 mL/hr at 07/21/18 2122 500 mL at 07/21/18 2122  . acetaminophen (TYLENOL) tablet 650 mg  650 mg Oral Q6H  PRN Bettey Costa, MD       Or  . acetaminophen (TYLENOL) suppository 650 mg  650 mg Rectal Q6H PRN Mody, Sital, MD      . amoxicillin-clavulanate (AUGMENTIN) 500-125 MG per tablet 500 mg  1 tablet Oral BID Wieting, Richard, MD      . aspirin chewable tablet 81 mg  81 mg Oral Daily Arta Silence, MD   81 mg at 07/23/18 0948  . atorvastatin (LIPITOR) tablet 40 mg  40 mg Oral QPM Mody, Sital, MD   40 mg at 07/22/18 1742  . bisacodyl (DULCOLAX) suppository 10 mg  10 mg Rectal Daily PRN Mody, Sital, MD      . carbamide peroxide (DEBROX) 6.5 % OTIC (EAR) solution 5 drop  5 drop Both EARS BID Mayo, Pete Pelt, MD   5 drop at 07/22/18 2133  . cholecalciferol (VITAMIN D) tablet 1,000 Units  1,000 Units Oral Daily Bettey Costa, MD   1,000 Units at 07/23/18 0948  . docusate sodium (COLACE) capsule 100 mg  100 mg Oral BID Bettey Costa, MD   100 mg at 07/23/18 0948  . febuxostat (ULORIC) tablet 40 mg  40 mg Oral Daily Mody, Sital, MD   40 mg at 07/23/18 0948  . furosemide (LASIX) tablet 40 mg  40 mg Oral Daily Bettey Costa, MD   40 mg at 07/23/18 0948  . hydrALAZINE (APRESOLINE) tablet 25 mg  25 mg Oral Q8H Mody, Sital, MD   25 mg at 07/23/18 0709  . HYDROcodone-acetaminophen (NORCO/VICODIN) 5-325 MG per tablet 1 tablet  1 tablet Oral Q4H PRN Mody, Sital, MD      . insulin aspart (novoLOG) injection 0-9 Units  0-9 Units Subcutaneous TID WC Bettey Costa, MD   1 Units at 07/22/18 1742  . insulin detemir (LEVEMIR) injection 20 Units  20 Units Subcutaneous QHS Lance Coon, MD   20 Units at 07/22/18 2132  . ipratropium-albuterol (DUONEB) 0.5-2.5 (3) MG/3ML nebulizer solution 3 mL  3 mL Nebulization TID Mayo, Pete Pelt, MD   3 mL at 07/23/18 0810  . isosorbide mononitrate (IMDUR) 24 hr tablet 60 mg  60 mg Oral Daily Bettey Costa, MD   60 mg at 07/23/18 0948  . LORazepam (ATIVAN) tablet 0.5 mg  0.5 mg Oral Daily Bettey Costa, MD   0.5 mg at 07/23/18 0948  . metoprolol tartrate (LOPRESSOR) tablet 50 mg  50 mg Oral BID  Bettey Costa, MD   50 mg at 07/23/18 0948  . ondansetron (ZOFRAN) tablet 4 mg  4 mg Oral Q6H PRN Mody, Sital, MD       Or  . ondansetron (ZOFRAN) injection 4 mg  4 mg Intravenous Q6H PRN Mody, Sital, MD      . pantoprazole (PROTONIX) EC tablet 40 mg  40 mg Oral Daily Bettey Costa, MD   40 mg at 07/23/18 0948  . polyethylene glycol (MIRALAX / GLYCOLAX) packet 17 g  17 g Oral Daily PRN Mody, Sital, MD      . potassium chloride SA (K-DUR,KLOR-CON) CR tablet 40  mEq  40 mEq Oral BID Sela Hua, MD   40 mEq at 07/23/18 0948  . sodium chloride flush (NS) 0.9 % injection 3 mL  3 mL Intravenous Q12H Bettey Costa, MD   3 mL at 07/21/18 2223  . sodium chloride flush (NS) 0.9 % injection 3 mL  3 mL Intravenous PRN Mody, Sital, MD      . sodium chloride flush (NS) 0.9 % injection 3 mL  3 mL Intravenous Q12H Mayo, Pete Pelt, MD   3 mL at 07/22/18 2134  . sodium chloride flush (NS) 0.9 % injection 3 mL  3 mL Intravenous Q12H Mayo, Pete Pelt, MD   3 mL at 07/22/18 2133  . torsemide (DEMADEX) tablet 20 mg  20 mg Oral BID Bettey Costa, MD   20 mg at 07/23/18 0948  . vitamin B-12 (CYANOCOBALAMIN) tablet 1,000 mcg  1,000 mcg Oral Daily Bettey Costa, MD   1,000 mcg at 07/23/18 4174     Discharge Medications: Please see discharge summary for a list of discharge medications.  Relevant Imaging Results:  Relevant Lab Results:   Additional Information YC#[id number]  Ross Ludwig, LCSW

## 2018-07-23 NOTE — Progress Notes (Signed)
EMS is here to pick up patient, belongings and discharge packet is sent with EMS. Discontinue peripheral IV and telemetry monitor.

## 2018-07-23 NOTE — Clinical Social Work Note (Addendum)
Patient to be d/c'ed today to [facility] room 111.  Patient and family agreeable to plans will transport via ems RN to call report to [phone].  CSW attempted to update patient's wife Justin Leach, [phone], however voice mail is not set up.  5:20pm  CSW received a phone call that insurance will not approve SNF stay for rehab.  CSW updated patient's wife, she is still in agreement to having patient return to [facility].   Evette Cristal, MSW, LCSW [phone]

## 2018-07-23 NOTE — Care Management Important Message (Signed)
Copy of signed Medicare IM left with patient in room. 

## 2018-07-23 NOTE — Plan of Care (Signed)
  Problem: Education: Goal: Knowledge of General Education information will improve Description Including pain rating scale, medication(s)/side effects and non-pharmacologic comfort measures Outcome: Progressing   Problem: Health Behavior/Discharge Planning: Goal: Ability to manage health-related needs will improve Outcome: Progressing   Problem: Clinical Measurements: Goal: Ability to maintain clinical measurements within normal limits will improve Outcome: Progressing Goal: Will remain free from infection Outcome: Progressing Note:  Remains afebrile, on PO antibiotics Goal: Respiratory complications will improve Outcome: Progressing Note:  Weaned to room air   Problem: Clinical Measurements: Goal: Diagnostic test results will improve Note:  WBC within normal limits, BUN 40/1.82

## 2018-07-23 NOTE — Clinical Social Work Note (Signed)
Clinical Social Work Assessment  Patient Details  Name: Justin Leach MRN: [id number] Date of Birth: 18-Jun-1931  Date of referral:  07/23/18               Reason for consult:  Facility Placement                Permission sought to share information with:  Facility Sport and exercise psychologist, Family Supports Permission granted to share information::  Yes, Verbal Permission Granted  Name::     Takumi, Din [phone] or Susa Simmonds   [phone]   Agency::  SNF admissions  Relationship::     Contact Information:     Housing/Transportation Living arrangements for the past 2 months:  [place] of Information:  Spouse Patient Interpreter Needed:  None Criminal Activity/Legal Involvement Pertinent to Current Situation/Hospitalization:  No - Comment as needed Significant Relationships:  Spouse, Other Family Members Lives with:  Facility Resident Do you feel safe going back to the place where you live?  Yes Need for family participation in patient care:  Yes (Comment)  Care giving concerns: Patient and family did not express an concerns about returning back to [facility].  Social Worker assessment / plan:  Patient is an 85 year old male who is alert and oriented x1.  Patient's wife was at bedside, he was sleeping during assessment, CSW spoke to patient's wife to complete assessment.  Patient initially was at [facility] for short term rehab, but then he had to be transferred to [facility] due to [employer].  Patient is transitioning to long term care, family is private paying for room and board, while he is still getting therapy under his part b.  Patient's family stated they liked [facility] better, but they are okay with him returning to same facility.  Patient's family would like CSW to try to get insurance auth, to help pay for stay at SNF, CSW informed the wife, that most likely insurance will  not approved but CSW will attempt.  Patient's family was appreciative of information given.  Patient's family did not have any other questions or concerns.  CSW was given permission to send clinicals to [facility].  Employment status:  Retired Forensic scientist:  Managed Care PT Recommendations:  [place] / Referral to community resources:  [employer]  Patient/Family's Response to care: Patient and family are in agreement to returning to SNF.  Patient/Family's Understanding of and Emotional Response to Diagnosis, Current Treatment, and Prognosis: Patient's family are aware of current treatment plan, and prognosis, patient is being followed by palliative at SNF.  Emotional Assessment Appearance:  Appears stated age Attitude/Demeanor/Rapport:    Affect (typically observed):  Appropriate, Stable Orientation:  Oriented to Self Alcohol / Substance use:  Not Applicable Psych involvement (Current and /or in the community):  No (Comment)  Discharge Needs  Concerns to be addressed:  Care Coordination Readmission within the last 30 days:  No Current discharge risk:  None Barriers to Discharge:  No Barriers Identified   Ross Ludwig, LCSW 07/23/2018, 7:14 PM

## 2018-07-23 NOTE — Progress Notes (Signed)
Called report to [place], RN to transfer to white [employer]. Also called EMT for transport and will send them with his discharge instructions. Will discontinue peripheral IV and telemetry monitor when EMS is here.

## 2018-07-23 NOTE — Discharge Summary (Signed)
[facility] NAME: Justin Leach    MR#:  [id number]  DATE OF BIRTH:  08-26-1931  DATE OF ADMISSION:  07/20/2018 ADMITTING PHYSICIAN: Bettey Costa, MD  DATE OF DISCHARGE: 07/23/2018  PRIMARY CARE PHYSICIAN: Lavone Orn, MD    ADMISSION DIAGNOSIS:  Hypokalemia [E87.6] Hyperglycemia [R73.9] Productive cough [R05] NSTEMI (non-ST elevated myocardial infarction) ([place]) [I21.4] PNA (pneumonia) [J18.9]  DISCHARGE DIAGNOSIS:  Active Problems:   Pneumonia   SECONDARY DIAGNOSIS:   Past Medical History:  Diagnosis Date  . Bradycardia    a. during 02/2015 admission - occasional HR in 40s while being treated for atrial fib.  . Cervical disc disease   . Chronic diastolic CHF (congestive heart failure) ([place])    a. Dx 02/2015 -  2D echo 03/02/15 showed severe focal basal hypertrophy of the septum, EF 55-60%, mild MR, no effusion.  . CKD (chronic kidney disease), stage III ([place])   . Coronary artery calcification seen on CT scan    a. Nuc 02/2015 was normal.  . Dyslipidemia   . GERD (gastroesophageal reflux disease)   . Hypertension   . 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 PAF (paroxysmal atrial fibrillation) ([place])    a. Dx 02/2015 - in and out on tele, rx'd Coumadin and amiodarone.  . Pneumonia 2007  . Shortness of breath dyspnea    WITH EXERTION  . Type II diabetes mellitus ([place])     HOSPITAL COURSE:   1.  Elevated troponin secondary to demand ischemia.  Troponin peaked at 0.46.  Patient denied any active chest pain.  Patient was not given any heparin secondary to recent subdural hematoma.  No complaints of chest pain.  Patient restarted on Imdur and already on beta-blocker.  Started on low-dose aspirin. 2.  Acute hypoxic respiratory failure with pneumonia.  Patient was tapered off oxygen today with pulse ox of 96%.  Can use oxygen 2 L as needed at facility if pulse ox drops lower than  88%. 3.  Pneumonia.  Patient was given Maxipime and vancomycin initially.  Vancomycin was discontinued since MRSA PCR was negative.  Switch over to Augmentin for a total of 7-day course (7 more doses at facility). 4.  Chronic diastolic congestive heart failure.  Continue torsemide and metoprolol. 5.  Essential hypertension.  Patient on metoprolol 6.  Type 2 diabetes mellitus.  Drop Levemir down to 18 units daily and continue sliding scale 7.  Hyperlipidemia unspecified on statin. 8.  Recent subdural hematoma.  Continue physical therapy. 9.  Chronic kidney disease stage III.  Continue to monitor with diuresis.  Check BMP every 1 to 2 weeks 10.  Palliative care to follow at facility 11.  Chronic anemia follow-up as outpatient.  Check hemoglobin every 2 to 3 weeks  DISCHARGE CONDITIONS:   Satisfactory  CONSULTS OBTAINED:  None  DRUG ALLERGIES:   Allergies  Allergen Reactions  . 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 Patient subsequently taken off amiodarone.  . Nifedipine Other (See Comments)    Reaction: made gums swell up. Pt states that he had to have gum surgery after taking.     DISCHARGE MEDICATIONS:   Allergies as of 07/23/2018      Reactions   Amiodarone Other (See Comments)   MD noted 10/31/15: Chest imaging in January showed new findings concerning  for amiodarone toxicity versus NSIP. Patient subsequently taken off amiodarone.   Nifedipine Other (See Comments)   Reaction: made gums swell up. Pt states that he had to have gum surgery after taking.       Medication List    STOP taking these medications   furosemide 40 MG tablet Commonly known as:  LASIX   OXYGEN   sodium chloride 0.65 % Soln nasal spray Commonly known as:  OCEAN   Vitamin D-3 25 MCG (1000 UT) Caps     TAKE these medications   acetaminophen 325 MG tablet Commonly known as:  TYLENOL Take  650 mg by mouth every 6 (six) hours as needed for mild pain or moderate pain.   ADMELOG 100 UNIT/ML injection Generic drug:  insulin lispro Inject 8 Units into the skin 3 (three) times daily with meals. Sliding Scale:  000-199: 0u 200-249: 2u 250-299: 4u 300-349: 6u 350-399: 8u 400-449: 10u 450-499: 12u 500+ : contact physician   albuterol (2.5 MG/3ML) 0.083% nebulizer solution Commonly known as:  PROVENTIL Take 2.5 mg by nebulization every 6 (six) hours as needed for wheezing or shortness of breath.   amoxicillin-clavulanate 500-125 MG tablet Commonly known as:  AUGMENTIN Take 1 tablet (500 mg total) by mouth 2 (two) times daily.   aspirin 81 MG chewable tablet Chew 1 tablet (81 mg total) by mouth daily.   atorvastatin 40 MG tablet Commonly known as:  LIPITOR Take 40 mg by mouth at bedtime.   bisacodyl 10 MG suppository Commonly known as:  DULCOLAX Place 1 suppository (10 mg total) rectally daily as needed for moderate constipation.   carbamide peroxide 6.5 % OTIC solution Commonly known as:  DEBROX Place 5 drops into both ears 2 (two) times daily.   docusate sodium 100 MG capsule Commonly known as:  COLACE Take 1 capsule (100 mg total) by mouth 2 (two) times daily.   febuxostat 40 MG tablet Commonly known as:  ULORIC Take 40 mg by mouth daily.   hydrALAZINE 25 MG tablet Commonly known as:  APRESOLINE Take 1 tablet (25 mg total) by mouth every 8 (eight) hours.   hydrocortisone cream 1 % Apply topically 2 (two) times daily. Apply to itchy areas on the left arm and chest   Insulin Detemir 100 UNIT/ML Pen Commonly known as:  LEVEMIR FLEXTOUCH Inject 18 Units into the skin at bedtime. What changed:  how much to take   isosorbide mononitrate 60 MG 24 hr tablet Commonly known as:  IMDUR Take 1 tablet (60 mg total) by mouth daily.   loperamide 2 MG capsule Commonly known as:  IMODIUM Take 2 mg by mouth every 8 (eight) hours as needed for diarrhea or loose  stools.   metoprolol tartrate 50 MG tablet Commonly known as:  LOPRESSOR Take 50 mg by mouth 2 (two) times daily.   nitroGLYCERIN 0.4 MG SL tablet Commonly known as:  NITROSTAT Place 0.4 mg under the tongue every 5 (five) minutes as needed for chest pain (max 3 doses).   NON FORMULARY Diet Type:  NAS, NCS   omeprazole 20 MG capsule Commonly known as:  PRILOSEC Take 20 mg by mouth daily.   oxyCODONE-acetaminophen 5-325 MG tablet Commonly known as:  PERCOCET/ROXICET Take 1 tablet by mouth every 6 (six) hours as needed. What changed:  reasons to take this   polyethylene glycol packet Commonly known as:  MIRALAX / GLYCOLAX Take 17 g by mouth daily as needed for mild constipation.   potassium chloride SA 20  MEQ tablet Commonly known as:  K-DUR,KLOR-CON Take 1 tablet (20 mEq total) by mouth daily.   torsemide 20 MG tablet Commonly known as:  DEMADEX Take 40 mg by mouth daily.   vitamin B-12 1000 MCG tablet Commonly known as:  CYANOCOBALAMIN Take 1 tablet (1,000 mcg total) by mouth daily.        DISCHARGE INSTRUCTIONS:   Follow-up with Dr. at facility 1 day Follow-up palliative care at facility  If you experience worsening of your admission symptoms, develop shortness of breath, life threatening emergency, suicidal or homicidal thoughts you must seek medical attention immediately by calling 911 or calling your MD immediately  if symptoms less severe.  You Must read complete instructions/literature along with all the possible adverse reactions/side effects for all the Medicines you take and that have been prescribed to you. Take any new Medicines after you have completely understood and accept all the possible adverse reactions/side effects.   Please note  You were cared for by a hospitalist during your hospital stay. If you have any questions about your discharge medications or the care you received while you were in the hospital after you are discharged, you can call  the unit and asked to speak with the hospitalist on call if the hospitalist that took care of you is not available. Once you are discharged, your primary care physician will handle any further medical issues. Please note that NO REFILLS for any discharge medications will be authorized once you are discharged, as it is imperative that you return to your primary care physician (or establish a relationship with a primary care physician if you do not have one) for your aftercare needs so that they can reassess your need for medications and monitor your lab values.    Today   CHIEF COMPLAINT:  Shortness of breath  HISTORY OF PRESENT ILLNESS:  Justin Leach  is a 85 y.o. male was found to have pneumonia   VITAL SIGNS:  Blood pressure (!) 144/58, pulse 69, temperature 98 F (36.7 C), temperature source Oral, resp. rate 17, height 5\' 9"  (1.753 m), weight 86 kg, SpO2 96 %.   PHYSICAL EXAMINATION:  GENERAL:  85 y.o.-year-old patient lying in the bed with no acute distress.  EYES: Pupils equal, round, reactive to light and accommodation. No scleral icterus. Extraocular muscles intact.  HEENT: Head atraumatic, normocephalic. Oropharynx and nasopharynx clear.  NECK:  Supple, no jugular venous distention. No thyroid enlargement, no tenderness.  LUNGS: Normal breath sounds bilaterally, no wheezing, rales,rhonchi or crepitation. No use of accessory muscles of respiration.  CARDIOVASCULAR: S1, S2 normal. No murmurs, rubs, or gallops.  ABDOMEN: Soft, non-tender, non-distended. Bowel sounds present. No organomegaly or mass.  EXTREMITIES: Trace pedal edema.no cyanosis, or clubbing.  NEUROLOGIC: Cranial nerves II through XII are intact. Muscle strength 5/5 in all extremities. Sensation intact. Gait not checked.  PSYCHIATRIC: The patient is alert and answers questions appropriately.  SKIN: No obvious rash, lesion, or ulcer.   DATA REVIEW:   CBC Recent Labs  Lab 07/22/18 0517  WBC 10.1  HGB 9.8*   HCT 31.5*  PLT 213    Chemistries  Recent Labs  Lab 07/20/18 1659  07/23/18 0425  NA 138   < > 141  K 3.2*   < > 4.8  CL 97*   < > 95*  CO2 33*   < > 37*  GLUCOSE 320*   < > 186*  BUN 45*   < > 40*  CREATININE 1.63*   < >  1.82*  CALCIUM 7.9*   < > 9.0  MG  --    < > 1.7  AST 20  --   --   ALT 19  --   --   ALKPHOS 96  --   --   BILITOT 0.7  --   --    < > = values in this interval not displayed.    Cardiac Enzymes Recent Labs  Lab 07/21/18 1132  TROPONINI 0.37*    Microbiology Results  Results for orders placed or performed during the hospital encounter of 07/20/18  Blood culture (routine x 2)     Status: None (Preliminary result)   Collection Time: 07/20/18  6:47 PM  Result Value Ref Range Status   Specimen Description BLOOD RIGHT ANTECUBITAL  Final   Special Requests   Final    BOTTLES DRAWN AEROBIC AND ANAEROBIC Blood Culture results may not be optimal due to an inadequate volume of blood received in culture bottles   Culture   Final    NO GROWTH 3 DAYS Performed at [facility], [address] [place], [place] [address]    Report Status PENDING  Incomplete  Blood culture (routine x 2)     Status: None (Preliminary result)   Collection Time: 07/20/18  6:47 PM  Result Value Ref Range Status   Specimen Description BLOOD BLOOD RIGHT HAND  Final   Special Requests   Final    BOTTLES DRAWN AEROBIC AND ANAEROBIC Blood Culture results may not be optimal due to an inadequate volume of blood received in culture bottles   Culture   Final    NO GROWTH 3 DAYS Performed at [facility], [address], [place], [place] [address]    Report Status PENDING  Incomplete  MRSA PCR Screening     Status: None   Collection Time: 07/21/18 12:21 AM  Result Value Ref Range Status   MRSA by PCR NEGATIVE NEGATIVE Final    Comment:        The GeneXpert MRSA Assay (FDA approved for NASAL specimens only), is one component of a comprehensive MRSA  colonization surveillance program. It is not intended to diagnose MRSA infection nor to guide or monitor treatment for MRSA infections. Performed at [facility], [address] [place],  [address]      Management plans discussed with the patient, family (on the phone) and they are in agreement.  CODE STATUS:     Code Status Orders  (From admission, onward)         Start     Ordered   07/20/18 1838  Full code  Continuous     07/20/18 1839        Code Status History    Date Active Date Inactive Code Status Order ID Comments User Context   05/13/2018 2224 05/27/2018 1831 Full Code [id number]  Ivor Costa, MD ED   10/30/2015 2351 11/05/2015 2307 Full Code [id number]  Toy Baker, MD ED   03/01/2015 0008 03/07/2015 2240 Full Code [id number]  Rise Patience, MD ED   05/11/2013 1354 05/17/2013 1943 Full Code [id number]  Rama, Venetia Maxon, MD Inpatient      TOTAL TIME TAKING CARE OF THIS PATIENT: 37 minutes.    Loletha Grayer M.D on 07/23/2018 at 9:51 AM  Between 7am to 6pm - Pager - [phone]  After 6pm go to [email or web address] - password [email or web address] [facility]  [phone]  CC: Primary care physician; Lavone Orn, MD

## 2018-07-23 NOTE — Progress Notes (Signed)
Called EMS again and get an update about ETA, they said he is on the list. RN will continue to monitor.

## 2018-07-23 NOTE — Clinical Social Work Note (Signed)
CSW spoke with patient and his wife who was at bedside they would like patient to return to [facility], formal assessment to follow, CSW has started insurance authorization.  Justin Leach. [place], MSW, [place]  07-22-2018

## 2018-07-24 LAB — GLUCOSE, CAPILLARY: GLUCOSE-CAPILLARY: 308 mg/dL — AB (ref 70–99)

## 2018-07-25 LAB — CULTURE, BLOOD (ROUTINE X 2)
Culture: NO GROWTH
Culture: NO GROWTH

## 2018-07-27 ENCOUNTER — Ambulatory Visit: Payer: Medicare Other

## 2018-07-27 DIAGNOSIS — D649 Anemia, unspecified: Secondary | ICD-10-CM | POA: Diagnosis not present

## 2018-07-27 DIAGNOSIS — M109 Gout, unspecified: Secondary | ICD-10-CM | POA: Diagnosis not present

## 2018-07-27 DIAGNOSIS — M6281 Muscle weakness (generalized): Secondary | ICD-10-CM | POA: Diagnosis not present

## 2018-07-27 DIAGNOSIS — R262 Difficulty in walking, not elsewhere classified: Secondary | ICD-10-CM | POA: Diagnosis not present

## 2018-07-27 DIAGNOSIS — E1122 Type 2 diabetes mellitus with diabetic chronic kidney disease: Secondary | ICD-10-CM | POA: Diagnosis not present

## 2018-07-28 NOTE — Progress Notes (Signed)
$'@Patient'u$  ID: Justin Leach, male    DOB: 07/17/1931, 85 y.o.   MRN: [id number]  Chief Complaint  Patient presents with  . Follow-up    Referring provider: Lavone Orn, MD  HPI:  85 year old male never smoker/former smokeless tobacco user followed in our office for interstitial lung disease, with a positive CCP, endobronchial lesions and is status post bronchoscopy which favored a benign process, following on CT scan, patient with short course of amiodarone in 2016.  PMH: Type 2 diabetes, hypertension, GERD, at risk for falls, chronic kidney disease stage III, subdural hematoma, congestive heart failure, dyslipidemia Smoker/ Smoking History: Never smoker, former smokeless tobacco user Maintenance: None Pt of: Dr. Vaughan Browner  07/29/2018  - Visit   85 year old male never smoker followed in our office for interstitial lung disease presents to our office today for a follow-up visit.  Patient since last office visit has completed a high-resolution CT scan.  Results are listed below:  07/17/2018-CT chest high-res- spectrum findings suggestive of basilar predominant interstitial lung disease without appreciable progression since 2017, without significant bronchiectasis or honeycombing again findings favor NSIP and are suggestive of alternative diagnoses (not UIP), new bandlike subpleural consolidation at left lung base favors subsegmental passive atelectasis versus nonspecific postinfectious and postinflammatory scarring  Since last office visit patient also presented to the hospital with elevated troponins.  Patient was treated as a non-STEMI as well as given antibiotics for suspected left lower lobe pneumonia.  There is also could be atelectasis as this was previously found in February/2020 chest CT with the results listed below.  Patient was treated with IV antibiotics as well as discharged with Augmentin for 7 days which patient is currently still on.  Patient presents today with his spouse as  well as with his sister.  They report the patient is doing better since hospital discharge and is getting back to baseline.  Patient is walking with a walker but still has shortness of breath.  Patient is currently working with physical therapy and worked with physical therapy on 07/28/2018 and was able to complete walking with a walker 30 feet and he did this 8 times.  Patient has not had any recent travel over the last 2 weeks.  Patient does reside in a rehab facility.  Family reporting today that as of 07/29/2018 there are no more visitors allowed to his rehab center due to recent covid 80 outbreak.   Chart review: 07/20/2018-hospitalization for non-STEMI Elevated troponin secondary to demand ischemia troponin peaked at 0.46 Patient with acute hypoxic respiratory failure with pneumonia Patient treated for pneumonia with Maxipime and vancomycin initially, patient then switched over to Augmentin for 7-day course  Tests:   Data Reviewed: PFTs 04/26/15 FVC 1.59 [52%), FEV1 1.25 (59%), F/F 79, TLC 2.69 [45%), DLCO 53% Severe restriction, moderate reduction in diffusion capacity. Diffusion capacity corrects for alveolar volume.  Imaging CTA 02/28/15 No PE, scarring, atelectasis at lung bases.  CT chest 06/12/15 Images reviewed. Subpleural reticulation, groundglass, 7 mm right lower lobe nodule.  CT high res 10/31/15 1. The appearance of the lungs appears very similar to the prior examination and is favored to reflect mild nonspecific interstitial pneumonia (NSIP). 2. 6 mm endobronchial nodule associated with the lateral margin of the proximal bronchus intermedius, similar to the prior study. This could represent an endobronchial polyp or other neoplasm. Correlation with bronchoscopy for direct inspection and potential biopsy is suggested if clinically appropriate. 3. Stable subpleural nodule with mean diameter of 6 mm.  07/17/2018-CT chest  high-res- spectrum findings suggestive of basilar  predominant interstitial lung disease without appreciable progression since 2017, without significant bronchiectasis or honeycombing again findings favor NSIP and are suggestive of alternative diagnoses (not UIP), new bandlike subpleural consolidation at left lung base favors subsegmental passive atelectasis versus nonspecific postinfectious and postinflammatory scarring  05/25/2018-echocardiogram-LV ejection fraction 65 to 16%, grade 1 diastolic dysfunction  LABS 07/14/15 ANA: Negative Centromere Ab Screen:  <1.0 Anti-CCP:  >250 RF:  <10 Smith Ab:  <1.0 DS DNA Ab:  <1 Jo-1 Ab:  <1.0 SSA:  <1.0 SSB:  <1.0 SCL-70:  <1.0 RNP:  <1.0  10/30/15 ESR: 38 CRP: 14.9 ANA: Neg RF:16.4 Anti-CCP : 107   Bronchoscopy 02/01/2016 BAL-WBC 99, 14% lymphs, 79% monocyte macrophage, 7% neutrophils Bronchial brushing cytology-benign reactive changes.   FENO:  No results found for: NITRICOXIDE  PFT: PFT Results Latest Ref Rng & Units 04/26/2015  FVC-Pre L 1.59  FVC-Predicted Pre % 52  FVC-Post L 1.62  FVC-Predicted Post % 53  Pre FEV1/FVC % % 79  Post FEV1/FCV % % 82  FEV1-Pre L 1.25  FEV1-Predicted Pre % 59  FEV1-Post L 1.33  DLCO UNC% % 53  DLCO COR %Predicted % 93    Imaging: Dg Chest 1 View  Result Date: 07/21/2018 CLINICAL DATA:  Pneumonia EXAM: CHEST  1 VIEW COMPARISON:  Yesterday FINDINGS: Mild cardiomegaly that is stable. Patchy density at the bases greatest on the left. There is history of pneumonia but there is also chronic lung disease as seen by chest CT 07/16/2018. No effusion or pneumothorax. No Kerley lines. IMPRESSION: Stable indistinct opacities at the bases. Electronically Signed   By: Monte Fantasia M.D.   On: 07/21/2018 04:58   Dg Chest 2 View  Result Date: 07/20/2018 CLINICAL DATA:  Productive cough.  Chest pain. EXAM: CHEST - 2 VIEW COMPARISON:  CT of the chest 07/16/2018. FINDINGS: The heart is mildly enlarged. Chronic left basilar airspace opacities are again noted.  No superimposed disease is present. There is no edema or effusion. No focal airspace disease is present. Exaggerated kyphosis is again noted. Surgical clips are present at the gallbladder fossa. A left shoulder replacement is noted. IMPRESSION: 1. No acute cardiopulmonary disease or significant interval change. 2. Stable chronic opacities at the left base. Electronically Signed   By: San Morelle M.D.   On: 07/20/2018 16:01   Ct Head Wo Contrast  Result Date: 07/20/2018 CLINICAL DATA:  Productive cough.  Fever.  History of bleed. EXAM: CT HEAD WITHOUT CONTRAST TECHNIQUE: Contiguous axial images were obtained from the base of the skull through the vertex without intravenous contrast. COMPARISON:  June 24, 2018 FINDINGS: Brain: No subdural, epidural, or subarachnoid hemorrhage. Ventricular prominence is out of proportion to mild sulcal prominence but stable. Cerebellum, brainstem, and basal cisterns are normal. No mass effect or midline shift. White matter changes. No acute cortical ischemia or infarct. Vascular: Calcified atherosclerosis is seen in the intracranial carotids. Skull: Normal. Negative for fracture or focal lesion. Sinuses/Orbits: There is fluid in left mastoid air cells, unchanged. The right mastoid air cells and middle ears are well aerated. No significant sinus abnormalities. Other: None. IMPRESSION: 1. No acute intracranial abnormality. 2. Ventricular prominence is out of proportion to sulcal prominence but is stable. This finding is nonspecific but could be seen with normal pressure hydrocephalus. 3. Fluid in left mastoid air cells, stable. Electronically Signed   By: Dorise Bullion III M.D   On: 07/20/2018 18:08   Ct Chest High Resolution  Result Date: 07/17/2018 CLINICAL DATA:  Follow-up interstitial lung disease.  CHF. EXAM: CT CHEST WITHOUT CONTRAST TECHNIQUE: Multidetector CT imaging of the chest was performed following the standard protocol without intravenous contrast. High  resolution imaging of the lungs, as well as inspiratory and expiratory imaging, was performed. COMPARISON:  05/23/2018 chest radiograph. 10/31/2015 high-resolution chest CT. FINDINGS: Cardiovascular: Mild cardiomegaly. No significant pericardial effusion/thickening. Left anterior descending and left circumflex coronary atherosclerosis. Atherosclerotic nonaneurysmal thoracic aorta. Normal caliber pulmonary arteries. Mediastinum/Nodes: No discrete thyroid nodules. Mildly patulous thoracic esophagus with small esophageal fluid level. No pathologically enlarged axillary, mediastinal or hilar lymph nodes, noting limited sensitivity for the detection of hilar adenopathy on this noncontrast study. Lungs/Pleura: No pneumothorax. No right pleural effusion. Trace dependent left pleural effusion. Stable eccentric 7 mm solid bronchus intermedius nodule (series 3/image 63) using similar measurement technique. Stable subpleural peripheral right lower lobe 6 mm solid pulmonary nodule (series 3/image 64), considered benign. No acute consolidative airspace disease, lung masses or new significant pulmonary nodules. No significant air trapping on the expiration sequence. Mild patchy subpleural reticulation and ground-glass attenuation in both lungs with a basilar predominance. These findings have not substantially changed since 10/31/2015 high-resolution chest CT study. No significant regions of traction bronchiectasis, architectural distortion or frank honeycombing. Stable diffuse bronchial wall thickening. Subpleural bandlike consolidation at the left lung base with associated volume loss, new, favor nonspecific postinfectious/postinflammatory scarring versus passive atelectasis. Upper abdomen: Cholecystectomy. Musculoskeletal: No aggressive appearing focal osseous lesions. Partially visualized surgical hardware from ACDF in the lower cervical spine and postsurgical changes from left total shoulder arthroplasty. Moderate thoracic  spondylosis. IMPRESSION: 1. Spectrum of findings suggestive of basilar predominant interstitial lung disease without appreciable progression since 2017, and without significant bronchiectasis or honeycombing. Findings again favor nonspecific interstitial pneumonia (NSIP). Findings are suggestive of an alternative diagnosis (not UIP) per consensus guidelines: Diagnosis of Idiopathic Pulmonary Fibrosis: An Official ATS/ERS/JRS/ALAT Clinical Practice Guideline. [place], Iss 5, [phone], Jan 18 2017. 2. New bandlike subpleural consolidation at the left lung base, favor subsegmental passive atelectasis versus nonspecific postinfectious/postinflammatory scarring. 3. Mild cardiomegaly. Two-vessel coronary atherosclerosis. Trace dependent left pleural effusion. 4. Mildly patulous thoracic esophagus with small esophageal fluid level, compatible with esophageal dysmotility and/or gastroesophageal reflux. Aortic Atherosclerosis (ICD10-I70.0). Electronically Signed   By: Ilona Sorrel M.D.   On: 07/17/2018 09:15      Specialty Problems      Pulmonary Problems   Bronchospasm   DOE (dyspnea on exertion)   Dyspnea   Pulmonary fibrosis (HCC)   Cough   HCAP (healthcare-associated pneumonia)   Pneumonia   Interstitial pulmonary disease (HCC)    PFTs 04/26/15 FVC 1.59 [52%), FEV1 1.25 (59%), F/F 79, TLC 2.69 [45%), DLCO 53% Severe restriction, moderate reduction in diffusion capacity. Diffusion capacity corrects for alveolar volume.  CT high res 10/31/15 1. The appearance of the lungs appears very similar to the prior examination and is favored to reflect mild nonspecific interstitial pneumonia (NSIP). 2. 6 mm endobronchial nodule associated with the lateral margin of the proximal bronchus intermedius, similar to the prior study. This could represent an endobronchial polyp or other neoplasm. Correlation with bronchoscopy for direct inspection and potential biopsy is suggested if  clinically appropriate. 3. Stable subpleural nodule with mean diameter of 6 mm. All images reviewed.  07/17/2018-CT chest high-res- spectrum findings suggestive of basilar predominant interstitial lung disease without appreciable progression since 2017, without significant bronchiectasis or honeycombing again findings favor NSIP and are suggestive  of alternative diagnoses (not UIP), new bandlike subpleural consolidation at left lung base favors subsegmental passive atelectasis versus nonspecific postinfectious and postinflammatory scarring  LABS 07/14/15 ANA: Negative Centromere Ab Screen:  <1.0 Anti-CCP:  >250 RF:  <10 Smith Ab:  <1.0 DS DNA Ab:  <1 Jo-1 Ab:  <1.0 SSA:  <1.0 SSB:  <1.0 SCL-70:  <1.0 RNP:  <1.0  10/30/15 ESR: 38 CRP: 14.9 ANA: Neg RF:16.4 Anti-CCP : 107   Bronchoscopy 02/01/2016 BAL-WBC 99, 14% lymphs, 79% monocyte macrophage, 7% neutrophils Bronchial brushing cytology-benign reactive changes.         Allergies  Allergen Reactions  . 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 Patient subsequently taken off amiodarone.  . Nifedipine Other (See Comments)    Reaction: made gums swell up. Pt states that he had to have gum surgery after taking.     Immunization History  Administered Date(s) Administered  . Influenza Split 02/17/2015  . Influenza, High Dose Seasonal PF 04/02/2016, 04/27/2018  . Influenza-Unspecified 02/17/2013  . Pneumococcal Polysaccharide-23 02/17/2015, 05/16/2018  . Tdap 05/13/2018    Past Medical History:  Diagnosis Date  . Bradycardia    a. during 02/2015 admission - occasional HR in 40s while being treated for atrial fib.  . Cervical disc disease   . Chronic diastolic CHF (congestive heart failure) ([place])    a. Dx 02/2015 -  2D echo 03/02/15 showed severe focal basal hypertrophy of the septum, EF 55-60%, mild MR, no effusion.    . CKD (chronic kidney disease), stage III ([place])   . Coronary artery calcification seen on CT scan    a. Nuc 02/2015 was normal.  . Dyslipidemia   . GERD (gastroesophageal reflux disease)   . Hypertension   . ILD (interstitial lung disease) ([place])    NSIP vs Amiodarone Induced Lung Injury  . PAF (paroxysmal atrial fibrillation) ([place])    a. Dx 02/2015 - in and out on tele, rx'd Coumadin and amiodarone.  . Pneumonia 2007  . Shortness of breath dyspnea    WITH EXERTION  . Type II diabetes mellitus (HCC)     Tobacco History: Social History   Tobacco Use  Smoking Status Never Smoker  Smokeless Tobacco Former Systems developer  . Types: Chew   Counseling given: Not Answered  Continue to not use smokeless tobacco Continue to not smoke  Outpatient Encounter Medications as of 07/29/2018  Medication Sig  . acetaminophen (TYLENOL) 325 MG tablet Take 650 mg by mouth every 6 (six) hours as needed for mild pain or moderate pain.   Marland Kitchen albuterol (PROVENTIL) (2.5 MG/3ML) 0.083% nebulizer solution Take 2.5 mg by nebulization every 6 (six) hours as needed for wheezing or shortness of breath.   Marland Kitchen amoxicillin-clavulanate (AUGMENTIN) 500-125 MG tablet Take 1 tablet (500 mg total) by mouth 2 (two) times daily.  Marland Kitchen aspirin 81 MG chewable tablet Chew 1 tablet (81 mg total) by mouth daily.  Marland Kitchen atorvastatin (LIPITOR) 40 MG tablet Take 40 mg by mouth at bedtime.   . bisacodyl (DULCOLAX) 10 MG suppository Place 1 suppository (10 mg total) rectally daily as needed for moderate constipation.  . carbamide peroxide (DEBROX) 6.5 % OTIC solution Place 5 drops into both ears 2 (two) times daily.  Marland Kitchen docusate sodium (COLACE) 100 MG capsule Take 1 capsule (100 mg total) by mouth 2 (two) times daily.  . febuxostat (ULORIC) 40 MG tablet Take 40 mg by mouth  daily.  . hydrALAZINE (APRESOLINE) 25 MG tablet Take 1 tablet (25 mg total) by mouth every 8 (eight) hours.  . hydrocortisone cream 1 % Apply topically 2 (two) times daily. Apply  to itchy areas on the left arm and chest  . Insulin Detemir (LEVEMIR FLEXTOUCH) 100 UNIT/ML Pen Inject 18 Units into the skin at bedtime.  . insulin lispro (ADMELOG) 100 UNIT/ML injection Inject 8 Units into the skin 3 (three) times daily with meals. Sliding Scale:  000-199: 0u 200-249: 2u 250-299: 4u 300-349: 6u 350-399: 8u 400-449: 10u 450-499: 12u 500+ : contact physician  . isosorbide mononitrate (IMDUR) 60 MG 24 hr tablet Take 1 tablet (60 mg total) by mouth daily.  Marland Kitchen loperamide (IMODIUM) 2 MG capsule Take 2 mg by mouth every 8 (eight) hours as needed for diarrhea or loose stools.  . metoprolol (LOPRESSOR) 50 MG tablet Take 50 mg by mouth 2 (two) times daily.  . nitroGLYCERIN (NITROSTAT) 0.4 MG SL tablet Place 0.4 mg under the tongue every 5 (five) minutes as needed for chest pain (max 3 doses).   . NON FORMULARY Diet Type:  NAS, NCS  . omeprazole (PRILOSEC) 20 MG capsule Take 20 mg by mouth daily.   Marland Kitchen oxyCODONE-acetaminophen (PERCOCET/ROXICET) 5-325 MG tablet Take 1 tablet by mouth every 6 (six) hours as needed.  . polyethylene glycol (MIRALAX / GLYCOLAX) packet Take 17 g by mouth daily as needed for mild constipation.  . potassium chloride SA (K-DUR,KLOR-CON) 20 MEQ tablet Take 1 tablet (20 mEq total) by mouth daily.  Marland Kitchen torsemide (DEMADEX) 20 MG tablet Take 40 mg by mouth daily.   . vitamin B-12 (CYANOCOBALAMIN) 1000 MCG tablet Take 1 tablet (1,000 mcg total) by mouth daily.   No facility-administered encounter medications on file as of 07/29/2018.      Review of Systems  Review of Systems  Constitutional: Positive for fatigue.  HENT: Positive for congestion. Negative for postnasal drip.   Respiratory: Positive for shortness of breath. Negative for cough and wheezing.   Cardiovascular: Negative for chest pain and palpitations.  Gastrointestinal: Negative for diarrhea, nausea and vomiting.  Neurological: Negative for dizziness.  Psychiatric/Behavioral: Negative for dysphoric  mood. The patient is not nervous/anxious.      Physical Exam  BP 132/62 (BP Location: Right Arm, Cuff Size: Large)   Pulse (!) 54   Ht '5\' 6"'$  (1.676 m)   Wt 188 lb (85.3 kg)   SpO2 95%   BMI 30.34 kg/m   Wt Readings from Last 5 Encounters:  07/29/18 188 lb (85.3 kg)  07/21/18 189 lb 9.5 oz (86 kg)  07/13/18 189 lb 9.6 oz (86 kg)  06/22/18 191 lb 3.2 oz (86.7 kg)  06/19/18 193 lb 3.2 oz (87.6 kg)     Physical Exam  Constitutional: He is oriented to person, place, and time and well-developed, well-nourished, and in no distress. No distress.  HENT:  Head: Normocephalic and atraumatic.  Right Ear: Hearing and external ear normal.  Left Ear: Hearing, external ear and ear canal normal.  Nose: Mucosal edema present. Right sinus exhibits no maxillary sinus tenderness and no frontal sinus tenderness. Left sinus exhibits no maxillary sinus tenderness and no frontal sinus tenderness.  Mouth/Throat: Uvula is midline and oropharynx is clear and moist. No oropharyngeal exudate.  Left ear partially visualized TM appears without infection or effusion, right canal occluded with cerumen, unable to visualize right TM  Eyes: Pupils are equal, round, and reactive to light.  Neck: Normal range of motion.  Neck supple.  Cardiovascular: Normal rate, regular rhythm and normal heart sounds.  Pulmonary/Chest: Effort normal and breath sounds normal. No accessory muscle usage. No respiratory distress. He has no decreased breath sounds. He has no wheezes. He has no rhonchi.  Slight left lower lobe crackles  Abdominal: Soft. Bowel sounds are normal. He exhibits no distension. There is no abdominal tenderness.  Musculoskeletal: Normal range of motion.        General: No edema.  Lymphadenopathy:    He has no cervical adenopathy.  Neurological: He is alert and oriented to person, place, and time.  Walks with a walker  Skin: Skin is warm and dry. He is not diaphoretic. No erythema.  Psychiatric: Mood, memory,  affect and judgment normal.  Nursing note and vitals reviewed.     Lab Results:  CBC    Component Value Date/Time   WBC 10.1 07/22/2018 0517   RBC 3.38 (L) 07/22/2018 0517   HGB 9.8 (L) 07/22/2018 0517   HCT 31.5 (L) 07/22/2018 0517   PLT 213 07/22/2018 0517   MCV 93.2 07/22/2018 0517   MCH 29.0 07/22/2018 0517   MCHC 31.1 07/22/2018 0517   RDW 14.8 07/22/2018 0517   LYMPHSABS 1.0 05/16/2018 0503   MONOABS 2.0 (H) 05/16/2018 0503   EOSABS 0.3 05/16/2018 0503   BASOSABS 0.1 05/16/2018 0503    BMET    Component Value Date/Time   NA 141 07/23/2018 0425   K 4.8 07/23/2018 0425   CL 95 (L) 07/23/2018 0425   CO2 37 (H) 07/23/2018 0425   GLUCOSE 186 (H) 07/23/2018 0425   BUN 40 (H) 07/23/2018 0425   CREATININE 1.82 (H) 07/23/2018 0425   CREATININE 2.57 (H) 03/16/2015 1217   CALCIUM 9.0 07/23/2018 0425   GFRNONAA 33 (L) 07/23/2018 0425   GFRAA 39 (L) 07/23/2018 0425    BNP    Component Value Date/Time   BNP 105.3 (H) 05/27/2018 0728   BNP 167.8 (H) 08/10/2015 1404    ProBNP    Component Value Date/Time   PROBNP 2,240.0 (H) 05/14/2013 0438      Assessment & Plan:    Interstitial pulmonary disease ([place]) Assessment: February/2020 CT stable compared with 2017 CT >>> Favoring NSIP, this is similar to the 2017 read Patient with autoimmune features but no active synovitis Patient working with physical therapy Questionable left lower lobe crackles on exam today  Plan: Follow-up with our office in 6 weeks We will get follow-up chest x-ray in 6 weeks although, I have low suspicion that the patient did have left lower lobe pneumonia I believe this was the atelectasis that was already seen on the high-resolution CT Continue to work on physical therapy When patient is clinically stable could consider repeating pulmonary function testing >>> Patient is status post fall in December/2019, March/2020 admission  If we have a repeat bronchoscopy could consider [place]  classifier   Fall at home Plan: Continue to work with physical therapy   Lauraine Rinne, NP 07/29/2018   This appointment was 30 min long with over 50% of the time in direct face-to-face patient care, assessment, plan of care, and follow-up.

## 2018-07-29 ENCOUNTER — Ambulatory Visit: Payer: Medicare Other | Admitting: Pulmonary Disease

## 2018-07-29 ENCOUNTER — Other Ambulatory Visit: Payer: Self-pay

## 2018-07-29 ENCOUNTER — Encounter: Payer: Self-pay | Admitting: Pulmonary Disease

## 2018-07-29 VITALS — BP 132/62 | HR 54 | Ht 66.0 in | Wt 188.0 lb

## 2018-07-29 DIAGNOSIS — W19XXXD Unspecified fall, subsequent encounter: Secondary | ICD-10-CM

## 2018-07-29 DIAGNOSIS — Y92009 Unspecified place in unspecified non-institutional (private) residence as the place of occurrence of the external cause: Secondary | ICD-10-CM

## 2018-07-29 DIAGNOSIS — J849 Interstitial pulmonary disease, unspecified: Secondary | ICD-10-CM

## 2018-07-29 NOTE — Assessment & Plan Note (Addendum)
Assessment: February/2020 CT stable compared with 2017 CT >>> Favoring NSIP, this is similar to the 2017 read Patient with autoimmune features but no active synovitis Patient working with physical therapy Questionable left lower lobe crackles on exam today  Plan: Follow-up with our office in 6 weeks We will get follow-up chest x-ray in 6 weeks although, I have low suspicion that the patient did have left lower lobe pneumonia I believe this was the atelectasis that was already seen on the high-resolution CT Continue to work on physical therapy When patient is clinically stable could consider repeating pulmonary function testing >>> Patient is status post fall in December/2019, March/2020 admission  If we have a repeat bronchoscopy could consider [place] classifier

## 2018-07-29 NOTE — Patient Instructions (Addendum)
Chest Xray in 4-6 weeks at next appointment with Dr. Vaughan Browner  Appointment with Dr. Vaughan Browner in 6 weeks with chest x-ray before  Keep up your hard work with physical therapy  Continue to monitor symptoms and contact our office if you spike a fever, start having increased fatigue, lose your appetite, or unable to do as much physical activity as you are currently doing   It is flu season:   >>> Best ways to protect herself from the flu: Receive the yearly flu vaccine, practice good hand hygiene washing with soap and also using hand sanitizer when available, eat a nutritious meals, get adequate rest, hydrate appropriately   Please contact the office if your symptoms worsen or you have concerns that you are not improving.   Thank you for choosing [facility] for your healthcare, and for allowing [place] to partner with you on your healthcare journey. I am thankful to be able to provide care to you today.   Wyn Quaker FNP-C

## 2018-07-29 NOTE — Assessment & Plan Note (Signed)
Plan: Continue to work with physical therapy

## 2018-07-30 ENCOUNTER — Ambulatory Visit: Payer: Medicare Other | Admitting: Family

## 2018-07-31 ENCOUNTER — Non-Acute Institutional Stay: Payer: Medicare Other | Admitting: Primary Care

## 2018-07-31 ENCOUNTER — Other Ambulatory Visit: Payer: Self-pay

## 2018-07-31 DIAGNOSIS — Z515 Encounter for palliative care: Secondary | ICD-10-CM | POA: Diagnosis not present

## 2018-07-31 DIAGNOSIS — R0602 Shortness of breath: Secondary | ICD-10-CM

## 2018-07-31 DIAGNOSIS — J189 Pneumonia, unspecified organism: Secondary | ICD-10-CM | POA: Diagnosis not present

## 2018-07-31 DIAGNOSIS — R531 Weakness: Secondary | ICD-10-CM | POA: Diagnosis not present

## 2018-07-31 NOTE — Progress Notes (Signed)
Justin Leach Consult Note Telephone: [phone]  Fax: [phone]  PATIENT NAME: Justin Leach DOB: [birth year] MRN: [id number]  PRIMARY CARE PROVIDER:   Alvester Morin, MD  REFERRING PROVIDER:  Alvester Morin, MD [phone] [place]. Justin Leach [place] [address]   RESPONSIBLE PARTY:   Extended Emergency Contact Information Primary Emergency Contact: Justin Leach Address: [id number]          [place], [place] [address] Justin Leach Phone: [phone] Work Phone: [phone] Mobile Phone: [phone] Relation: Spouse Secondary Emergency Contact: Justin Leach Mobile Phone: [phone] Relation: Nephew    Palliative Care consultation Note:   ASSESSMENT and RECOMMENDATIONS:   1. Goals of Care: No DNR on Chart. Patient speaks of wanting to get his function back as he had a fall in December and was living alone. He states his wife is living with her sister because she needs care and he cannot care for her. He states she's the love of his life. He feels he may need Assisted Living now. Will continue to discuss goals of care on future visits.   2. Diabetic: Review current insulin dose as patient has had hypoglycemic episodes lately. States he has been diabetic since age 17. Says he manages sugar well. States A1C 6. 5%. States he's had hypoglycemic episodes at this SNF, needing 2 cups of ice cream to treat.  3. Fall Risk: Uses  walker, denies falls. States desire to improve function. Recounts his fall that began his recent illness.States he had a home monitoring program that he called and EMS came. We discussed risk of falling again and safety measures. He has a gait belt. He can ambulates with walker to bathroom safely.  Palliative care will continue to follow for goals of care, symptom management. Return in 4 weeks or prn.  I spent 25 minutes providing this consultation,  from 1130 to 1155.  More than 50% of the time in this consultation was spent coordinating communication.   HISTORY OF PRESENT ILLNESS:  Justin Leach is a 85 y.o. year old male with multiple medical problems including chronic diastolic heart failure, PAF, recent subdural hematoma after a fall in December, diabetes and chronic kidney disease stage III, recent NSTEMI. Palliative Care was asked to help address goals of care.   CODE STATUS: FULL CODE  PPS: 40% HOSPICE ELIGIBILITY/DIAGNOSIS: TBD  PAST MEDICAL HISTORY:  Past Medical History:  Diagnosis Date  . Bradycardia    a. during 02/2015 admission - occasional HR in 40s while being treated for atrial fib.  . Cervical disc disease   . Chronic diastolic CHF (congestive heart failure) ([place])    a. Dx 02/2015 -  2D echo 03/02/15 showed severe focal basal hypertrophy of the septum, EF 55-60%, mild MR, no effusion.  . CKD (chronic kidney disease), stage III ([place])   . Coronary artery calcification seen on CT scan    a. Nuc 02/2015 was normal.  . Dyslipidemia   . GERD (gastroesophageal reflux disease)   . Hypertension   . 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 PAF (paroxysmal atrial fibrillation) ([facility])    a. Dx 02/2015 - in and out on tele, rx'd Coumadin and amiodarone.  . Pneumonia 2007  . Shortness of breath dyspnea    WITH EXERTION  . Type II diabetes mellitus ([place])     SOCIAL HX:  Social History   Tobacco Use  . Smoking status:  Never Smoker  . Smokeless tobacco: Former Systems developer    Types: Chew  Substance Use Topics  . Alcohol use: No    ALLERGIES:  Allergies  Allergen Reactions  . 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 Patient subsequently taken off amiodarone.  . Nifedipine Other (See Comments)    Reaction: made gums swell up. Pt states that he had to have gum surgery after taking.       PERTINENT MEDICATIONS:  Outpatient Encounter Medications as of 07/31/2018  Medication Sig  . acetaminophen (TYLENOL) 325 MG tablet Take 650 mg by mouth every 6 (six) hours as needed for mild pain or moderate pain.   Marland Kitchen albuterol (PROVENTIL) (2.5 MG/3ML) 0.083% nebulizer solution Take 2.5 mg by nebulization every 6 (six) hours as needed for wheezing or shortness of breath.   Marland Kitchen amoxicillin-clavulanate (AUGMENTIN) 500-125 MG tablet Take 1 tablet (500 mg total) by mouth 2 (two) times daily.  Marland Kitchen aspirin 81 MG chewable tablet Chew 1 tablet (81 mg total) by mouth daily.  Marland Kitchen atorvastatin (LIPITOR) 40 MG tablet Take 40 mg by mouth at bedtime.   . bisacodyl (DULCOLAX) 10 MG suppository Place 1 suppository (10 mg total) rectally daily as needed for moderate constipation.  . carbamide peroxide (DEBROX) 6.5 % OTIC solution Place 5 drops into both ears 2 (two) times daily.  Marland Kitchen docusate sodium (COLACE) 100 MG capsule Take 1 capsule (100 mg total) by mouth 2 (two) times daily.  . febuxostat (ULORIC) 40 MG tablet Take 40 mg by mouth daily.  . hydrALAZINE (APRESOLINE) 25 MG tablet Take 1 tablet (25 mg total) by mouth every 8 (eight) hours.  . hydrocortisone cream 1 % Apply topically 2 (two) times daily. Apply to itchy areas on the left arm and chest  . Insulin Detemir (LEVEMIR FLEXTOUCH) 100 UNIT/ML Pen Inject 18 Units into the skin at bedtime.  . insulin lispro (ADMELOG) 100 UNIT/ML injection Inject 8 Units into the skin 3 (three) times daily with meals. Sliding Scale:  000-199: 0u 200-249: 2u 250-299: 4u 300-349: 6u 350-399: 8u 400-449: 10u 450-499: 12u 500+ : contact physician  . isosorbide mononitrate (IMDUR) 60 MG 24 hr tablet Take 1 tablet (60 mg total) by mouth daily.  Marland Kitchen loperamide (IMODIUM) 2 MG capsule Take 2 mg by mouth every 8 (eight) hours as needed for diarrhea or loose stools.  . metoprolol (LOPRESSOR) 50 MG tablet Take 50 mg by mouth 2 (two) times daily.  . nitroGLYCERIN (NITROSTAT) 0.4 MG SL  tablet Place 0.4 mg under the tongue every 5 (five) minutes as needed for chest pain (max 3 doses).   . NON FORMULARY Diet Type:  NAS, NCS  . omeprazole (PRILOSEC) 20 MG capsule Take 20 mg by mouth daily.   Marland Kitchen oxyCODONE-acetaminophen (PERCOCET/ROXICET) 5-325 MG tablet Take 1 tablet by mouth every 6 (six) hours as needed.  . polyethylene glycol (MIRALAX / GLYCOLAX) packet Take 17 g by mouth daily as needed for mild constipation.  . potassium chloride SA (K-DUR,KLOR-CON) 20 MEQ tablet Take 1 tablet (20 mEq total) by mouth daily.  Marland Kitchen torsemide (DEMADEX) 20 MG tablet Take 40 mg by mouth daily.   . vitamin B-12 (CYANOCOBALAMIN) 1000 MCG tablet Take 1 tablet (1,000 mcg total) by mouth daily.   No facility-administered encounter medications on file as of 07/31/2018.     PHYSICAL EXAM:   Wt 182, 168/68  97.8-57-18, 94%  On room  air  General: NAD, frail appearing, obese, HOH Cardiovascular: regular rate and rhythm, S1S2 Pulmonary: clear ant fields, occ.cough Abdomen: soft, nontender, + bowel sounds, denies constipation GU: Continent of urine. Extremities: no edema, left shoulder with decreased ROM. Skin: no rashes, no wounds Neurological: Weakness, limited ROM in left arm 2/2 Fx.  Cyndia Skeeters  DNP AGPCNP-BC

## 2018-08-03 ENCOUNTER — Telehealth: Payer: Self-pay | Admitting: Primary Care

## 2018-08-03 NOTE — Telephone Encounter (Signed)
Reported visit on 07/31/2018 and asked POA if he has questions. He stated patient is seeking ALF placement and should know something soon. Will continue to follow.

## 2018-08-04 DIAGNOSIS — M6281 Muscle weakness (generalized): Secondary | ICD-10-CM | POA: Diagnosis not present

## 2018-08-04 DIAGNOSIS — I5032 Chronic diastolic (congestive) heart failure: Secondary | ICD-10-CM | POA: Diagnosis not present

## 2018-08-04 DIAGNOSIS — I480 Paroxysmal atrial fibrillation: Secondary | ICD-10-CM | POA: Diagnosis not present

## 2018-08-04 DIAGNOSIS — I130 Hypertensive heart and chronic kidney disease with heart failure and stage 1 through stage 4 chronic kidney disease, or unspecified chronic kidney disease: Secondary | ICD-10-CM | POA: Diagnosis not present

## 2018-08-05 DIAGNOSIS — M6281 Muscle weakness (generalized): Secondary | ICD-10-CM | POA: Diagnosis not present

## 2018-08-05 DIAGNOSIS — D649 Anemia, unspecified: Secondary | ICD-10-CM | POA: Diagnosis not present

## 2018-08-08 DIAGNOSIS — D649 Anemia, unspecified: Secondary | ICD-10-CM | POA: Diagnosis not present

## 2018-08-08 DIAGNOSIS — Z79899 Other long term (current) drug therapy: Secondary | ICD-10-CM | POA: Diagnosis not present

## 2018-08-08 DIAGNOSIS — D519 Vitamin B12 deficiency anemia, unspecified: Secondary | ICD-10-CM | POA: Diagnosis not present

## 2018-08-08 DIAGNOSIS — E785 Hyperlipidemia, unspecified: Secondary | ICD-10-CM | POA: Diagnosis not present

## 2018-08-10 DIAGNOSIS — D649 Anemia, unspecified: Secondary | ICD-10-CM | POA: Diagnosis not present

## 2018-08-10 DIAGNOSIS — Z79899 Other long term (current) drug therapy: Secondary | ICD-10-CM | POA: Diagnosis not present

## 2018-08-17 DIAGNOSIS — Z79899 Other long term (current) drug therapy: Secondary | ICD-10-CM | POA: Diagnosis not present

## 2018-08-17 DIAGNOSIS — D649 Anemia, unspecified: Secondary | ICD-10-CM | POA: Diagnosis not present

## 2018-08-19 DIAGNOSIS — M6281 Muscle weakness (generalized): Secondary | ICD-10-CM | POA: Diagnosis not present

## 2018-08-19 DIAGNOSIS — R262 Difficulty in walking, not elsewhere classified: Secondary | ICD-10-CM | POA: Diagnosis not present

## 2018-08-19 DIAGNOSIS — M109 Gout, unspecified: Secondary | ICD-10-CM | POA: Diagnosis not present

## 2018-08-21 ENCOUNTER — Telehealth: Payer: Self-pay

## 2018-08-21 NOTE — Telephone Encounter (Signed)
Call made to patient, spoke with patient wife, patient is currently in a facility. She requested that we cancel appt and mail a letter or call in July to get him rescheduled. Recall placed. Noting further is needed at this time.

## 2018-08-24 DIAGNOSIS — Z79899 Other long term (current) drug therapy: Secondary | ICD-10-CM | POA: Diagnosis not present

## 2018-08-24 DIAGNOSIS — D649 Anemia, unspecified: Secondary | ICD-10-CM | POA: Diagnosis not present

## 2018-08-31 DIAGNOSIS — D649 Anemia, unspecified: Secondary | ICD-10-CM | POA: Diagnosis not present

## 2018-08-31 DIAGNOSIS — Z79899 Other long term (current) drug therapy: Secondary | ICD-10-CM | POA: Diagnosis not present

## 2018-09-01 ENCOUNTER — Ambulatory Visit: Payer: Medicare Other | Admitting: Pulmonary Disease

## 2018-09-03 DIAGNOSIS — I5032 Chronic diastolic (congestive) heart failure: Secondary | ICD-10-CM | POA: Diagnosis not present

## 2018-09-03 DIAGNOSIS — Z7189 Other specified counseling: Secondary | ICD-10-CM | POA: Diagnosis not present

## 2018-09-04 DIAGNOSIS — B351 Tinea unguium: Secondary | ICD-10-CM | POA: Diagnosis not present

## 2018-09-04 DIAGNOSIS — L84 Corns and callosities: Secondary | ICD-10-CM | POA: Diagnosis not present

## 2018-09-04 DIAGNOSIS — E1151 Type 2 diabetes mellitus with diabetic peripheral angiopathy without gangrene: Secondary | ICD-10-CM | POA: Diagnosis not present

## 2018-09-04 DIAGNOSIS — L603 Nail dystrophy: Secondary | ICD-10-CM | POA: Diagnosis not present

## 2018-09-07 DIAGNOSIS — J8489 Other specified interstitial pulmonary diseases: Secondary | ICD-10-CM | POA: Diagnosis not present

## 2018-09-07 DIAGNOSIS — E1122 Type 2 diabetes mellitus with diabetic chronic kidney disease: Secondary | ICD-10-CM | POA: Diagnosis not present

## 2018-09-07 DIAGNOSIS — M6281 Muscle weakness (generalized): Secondary | ICD-10-CM | POA: Diagnosis not present

## 2018-09-07 DIAGNOSIS — E119 Type 2 diabetes mellitus without complications: Secondary | ICD-10-CM | POA: Diagnosis not present

## 2018-09-07 DIAGNOSIS — Z79899 Other long term (current) drug therapy: Secondary | ICD-10-CM | POA: Diagnosis not present

## 2018-09-07 DIAGNOSIS — D649 Anemia, unspecified: Secondary | ICD-10-CM | POA: Diagnosis not present

## 2018-09-07 DIAGNOSIS — S42242D 4-part fracture of surgical neck of left humerus, subsequent encounter for fracture with routine healing: Secondary | ICD-10-CM | POA: Diagnosis not present

## 2018-09-07 DIAGNOSIS — I130 Hypertensive heart and chronic kidney disease with heart failure and stage 1 through stage 4 chronic kidney disease, or unspecified chronic kidney disease: Secondary | ICD-10-CM | POA: Diagnosis not present

## 2018-09-07 DIAGNOSIS — S065X0D Traumatic subdural hemorrhage without loss of consciousness, subsequent encounter: Secondary | ICD-10-CM | POA: Diagnosis not present

## 2018-09-08 DIAGNOSIS — Z9181 History of falling: Secondary | ICD-10-CM | POA: Diagnosis not present

## 2018-09-08 DIAGNOSIS — I252 Old myocardial infarction: Secondary | ICD-10-CM | POA: Diagnosis not present

## 2018-09-08 DIAGNOSIS — N183 Chronic kidney disease, stage 3 (moderate): Secondary | ICD-10-CM | POA: Diagnosis not present

## 2018-09-08 DIAGNOSIS — Z96612 Presence of left artificial shoulder joint: Secondary | ICD-10-CM | POA: Diagnosis not present

## 2018-09-08 DIAGNOSIS — Z7982 Long term (current) use of aspirin: Secondary | ICD-10-CM | POA: Diagnosis not present

## 2018-09-08 DIAGNOSIS — M5090 Cervical disc disorder, unspecified, unspecified cervical region: Secondary | ICD-10-CM | POA: Diagnosis not present

## 2018-09-08 DIAGNOSIS — I5032 Chronic diastolic (congestive) heart failure: Secondary | ICD-10-CM | POA: Diagnosis not present

## 2018-09-08 DIAGNOSIS — Z8701 Personal history of pneumonia (recurrent): Secondary | ICD-10-CM | POA: Diagnosis not present

## 2018-09-08 DIAGNOSIS — E785 Hyperlipidemia, unspecified: Secondary | ICD-10-CM | POA: Diagnosis not present

## 2018-09-08 DIAGNOSIS — M109 Gout, unspecified: Secondary | ICD-10-CM | POA: Diagnosis not present

## 2018-09-08 DIAGNOSIS — I130 Hypertensive heart and chronic kidney disease with heart failure and stage 1 through stage 4 chronic kidney disease, or unspecified chronic kidney disease: Secondary | ICD-10-CM | POA: Diagnosis not present

## 2018-09-08 DIAGNOSIS — Z794 Long term (current) use of insulin: Secondary | ICD-10-CM | POA: Diagnosis not present

## 2018-09-08 DIAGNOSIS — I2510 Atherosclerotic heart disease of native coronary artery without angina pectoris: Secondary | ICD-10-CM | POA: Diagnosis not present

## 2018-09-08 DIAGNOSIS — I480 Paroxysmal atrial fibrillation: Secondary | ICD-10-CM | POA: Diagnosis not present

## 2018-09-08 DIAGNOSIS — J849 Interstitial pulmonary disease, unspecified: Secondary | ICD-10-CM | POA: Diagnosis not present

## 2018-09-08 DIAGNOSIS — E1122 Type 2 diabetes mellitus with diabetic chronic kidney disease: Secondary | ICD-10-CM | POA: Diagnosis not present

## 2018-09-15 DIAGNOSIS — I5032 Chronic diastolic (congestive) heart failure: Secondary | ICD-10-CM | POA: Diagnosis not present

## 2018-09-15 DIAGNOSIS — J849 Interstitial pulmonary disease, unspecified: Secondary | ICD-10-CM | POA: Diagnosis not present

## 2018-09-15 DIAGNOSIS — E785 Hyperlipidemia, unspecified: Secondary | ICD-10-CM | POA: Diagnosis not present

## 2018-09-15 DIAGNOSIS — Z8701 Personal history of pneumonia (recurrent): Secondary | ICD-10-CM | POA: Diagnosis not present

## 2018-09-15 DIAGNOSIS — Z794 Long term (current) use of insulin: Secondary | ICD-10-CM | POA: Diagnosis not present

## 2018-09-15 DIAGNOSIS — Z7982 Long term (current) use of aspirin: Secondary | ICD-10-CM | POA: Diagnosis not present

## 2018-09-15 DIAGNOSIS — I129 Hypertensive chronic kidney disease with stage 1 through stage 4 chronic kidney disease, or unspecified chronic kidney disease: Secondary | ICD-10-CM | POA: Diagnosis not present

## 2018-09-15 DIAGNOSIS — Z96612 Presence of left artificial shoulder joint: Secondary | ICD-10-CM | POA: Diagnosis not present

## 2018-09-15 DIAGNOSIS — N183 Chronic kidney disease, stage 3 (moderate): Secondary | ICD-10-CM | POA: Diagnosis not present

## 2018-09-15 DIAGNOSIS — Z9181 History of falling: Secondary | ICD-10-CM | POA: Diagnosis not present

## 2018-09-15 DIAGNOSIS — R269 Unspecified abnormalities of gait and mobility: Secondary | ICD-10-CM | POA: Diagnosis not present

## 2018-09-15 DIAGNOSIS — M5090 Cervical disc disorder, unspecified, unspecified cervical region: Secondary | ICD-10-CM | POA: Diagnosis not present

## 2018-09-15 DIAGNOSIS — R2681 Unsteadiness on feet: Secondary | ICD-10-CM | POA: Diagnosis not present

## 2018-09-15 DIAGNOSIS — E1129 Type 2 diabetes mellitus with other diabetic kidney complication: Secondary | ICD-10-CM | POA: Diagnosis not present

## 2018-09-15 DIAGNOSIS — E1122 Type 2 diabetes mellitus with diabetic chronic kidney disease: Secondary | ICD-10-CM | POA: Diagnosis not present

## 2018-09-15 DIAGNOSIS — S42215A Unspecified nondisplaced fracture of surgical neck of left humerus, initial encounter for closed fracture: Secondary | ICD-10-CM | POA: Diagnosis not present

## 2018-09-15 DIAGNOSIS — I130 Hypertensive heart and chronic kidney disease with heart failure and stage 1 through stage 4 chronic kidney disease, or unspecified chronic kidney disease: Secondary | ICD-10-CM | POA: Diagnosis not present

## 2018-09-15 DIAGNOSIS — M109 Gout, unspecified: Secondary | ICD-10-CM | POA: Diagnosis not present

## 2018-09-15 DIAGNOSIS — I2510 Atherosclerotic heart disease of native coronary artery without angina pectoris: Secondary | ICD-10-CM | POA: Diagnosis not present

## 2018-09-15 DIAGNOSIS — S42215S Unspecified nondisplaced fracture of surgical neck of left humerus, sequela: Secondary | ICD-10-CM | POA: Diagnosis not present

## 2018-09-15 DIAGNOSIS — I252 Old myocardial infarction: Secondary | ICD-10-CM | POA: Diagnosis not present

## 2018-09-15 DIAGNOSIS — I480 Paroxysmal atrial fibrillation: Secondary | ICD-10-CM | POA: Diagnosis not present

## 2018-09-16 DIAGNOSIS — Z8701 Personal history of pneumonia (recurrent): Secondary | ICD-10-CM | POA: Diagnosis not present

## 2018-09-16 DIAGNOSIS — Z96612 Presence of left artificial shoulder joint: Secondary | ICD-10-CM | POA: Diagnosis not present

## 2018-09-16 DIAGNOSIS — N183 Chronic kidney disease, stage 3 (moderate): Secondary | ICD-10-CM | POA: Diagnosis not present

## 2018-09-16 DIAGNOSIS — I5032 Chronic diastolic (congestive) heart failure: Secondary | ICD-10-CM | POA: Diagnosis not present

## 2018-09-16 DIAGNOSIS — I252 Old myocardial infarction: Secondary | ICD-10-CM | POA: Diagnosis not present

## 2018-09-16 DIAGNOSIS — J849 Interstitial pulmonary disease, unspecified: Secondary | ICD-10-CM | POA: Diagnosis not present

## 2018-09-16 DIAGNOSIS — E1122 Type 2 diabetes mellitus with diabetic chronic kidney disease: Secondary | ICD-10-CM | POA: Diagnosis not present

## 2018-09-16 DIAGNOSIS — M109 Gout, unspecified: Secondary | ICD-10-CM | POA: Diagnosis not present

## 2018-09-16 DIAGNOSIS — I130 Hypertensive heart and chronic kidney disease with heart failure and stage 1 through stage 4 chronic kidney disease, or unspecified chronic kidney disease: Secondary | ICD-10-CM | POA: Diagnosis not present

## 2018-09-16 DIAGNOSIS — Z794 Long term (current) use of insulin: Secondary | ICD-10-CM | POA: Diagnosis not present

## 2018-09-16 DIAGNOSIS — Z7982 Long term (current) use of aspirin: Secondary | ICD-10-CM | POA: Diagnosis not present

## 2018-09-16 DIAGNOSIS — M5090 Cervical disc disorder, unspecified, unspecified cervical region: Secondary | ICD-10-CM | POA: Diagnosis not present

## 2018-09-16 DIAGNOSIS — E785 Hyperlipidemia, unspecified: Secondary | ICD-10-CM | POA: Diagnosis not present

## 2018-09-16 DIAGNOSIS — I2510 Atherosclerotic heart disease of native coronary artery without angina pectoris: Secondary | ICD-10-CM | POA: Diagnosis not present

## 2018-09-16 DIAGNOSIS — I480 Paroxysmal atrial fibrillation: Secondary | ICD-10-CM | POA: Diagnosis not present

## 2018-09-16 DIAGNOSIS — Z9181 History of falling: Secondary | ICD-10-CM | POA: Diagnosis not present

## 2018-09-17 DIAGNOSIS — M109 Gout, unspecified: Secondary | ICD-10-CM | POA: Diagnosis not present

## 2018-09-17 DIAGNOSIS — E1122 Type 2 diabetes mellitus with diabetic chronic kidney disease: Secondary | ICD-10-CM | POA: Diagnosis not present

## 2018-09-17 DIAGNOSIS — I2510 Atherosclerotic heart disease of native coronary artery without angina pectoris: Secondary | ICD-10-CM | POA: Diagnosis not present

## 2018-09-17 DIAGNOSIS — Z794 Long term (current) use of insulin: Secondary | ICD-10-CM | POA: Diagnosis not present

## 2018-09-17 DIAGNOSIS — I252 Old myocardial infarction: Secondary | ICD-10-CM | POA: Diagnosis not present

## 2018-09-17 DIAGNOSIS — I5030 Unspecified diastolic (congestive) heart failure: Secondary | ICD-10-CM | POA: Diagnosis not present

## 2018-09-17 DIAGNOSIS — Z7982 Long term (current) use of aspirin: Secondary | ICD-10-CM | POA: Diagnosis not present

## 2018-09-17 DIAGNOSIS — Z8701 Personal history of pneumonia (recurrent): Secondary | ICD-10-CM | POA: Diagnosis not present

## 2018-09-17 DIAGNOSIS — N183 Chronic kidney disease, stage 3 (moderate): Secondary | ICD-10-CM | POA: Diagnosis not present

## 2018-09-17 DIAGNOSIS — I480 Paroxysmal atrial fibrillation: Secondary | ICD-10-CM | POA: Diagnosis not present

## 2018-09-17 DIAGNOSIS — J849 Interstitial pulmonary disease, unspecified: Secondary | ICD-10-CM | POA: Diagnosis not present

## 2018-09-17 DIAGNOSIS — Z96612 Presence of left artificial shoulder joint: Secondary | ICD-10-CM | POA: Diagnosis not present

## 2018-09-17 DIAGNOSIS — I5032 Chronic diastolic (congestive) heart failure: Secondary | ICD-10-CM | POA: Diagnosis not present

## 2018-09-17 DIAGNOSIS — I130 Hypertensive heart and chronic kidney disease with heart failure and stage 1 through stage 4 chronic kidney disease, or unspecified chronic kidney disease: Secondary | ICD-10-CM | POA: Diagnosis not present

## 2018-09-17 DIAGNOSIS — M5090 Cervical disc disorder, unspecified, unspecified cervical region: Secondary | ICD-10-CM | POA: Diagnosis not present

## 2018-09-17 DIAGNOSIS — Z9181 History of falling: Secondary | ICD-10-CM | POA: Diagnosis not present

## 2018-09-17 DIAGNOSIS — E785 Hyperlipidemia, unspecified: Secondary | ICD-10-CM | POA: Diagnosis not present

## 2018-09-17 DIAGNOSIS — E113513 Type 2 diabetes mellitus with proliferative diabetic retinopathy with macular edema, bilateral: Secondary | ICD-10-CM | POA: Diagnosis not present

## 2018-09-21 DIAGNOSIS — I480 Paroxysmal atrial fibrillation: Secondary | ICD-10-CM | POA: Diagnosis not present

## 2018-09-21 DIAGNOSIS — E785 Hyperlipidemia, unspecified: Secondary | ICD-10-CM | POA: Diagnosis not present

## 2018-09-21 DIAGNOSIS — J849 Interstitial pulmonary disease, unspecified: Secondary | ICD-10-CM | POA: Diagnosis not present

## 2018-09-21 DIAGNOSIS — I130 Hypertensive heart and chronic kidney disease with heart failure and stage 1 through stage 4 chronic kidney disease, or unspecified chronic kidney disease: Secondary | ICD-10-CM | POA: Diagnosis not present

## 2018-09-21 DIAGNOSIS — I5032 Chronic diastolic (congestive) heart failure: Secondary | ICD-10-CM | POA: Diagnosis not present

## 2018-09-21 DIAGNOSIS — E1122 Type 2 diabetes mellitus with diabetic chronic kidney disease: Secondary | ICD-10-CM | POA: Diagnosis not present

## 2018-09-21 DIAGNOSIS — I252 Old myocardial infarction: Secondary | ICD-10-CM | POA: Diagnosis not present

## 2018-09-21 DIAGNOSIS — N183 Chronic kidney disease, stage 3 (moderate): Secondary | ICD-10-CM | POA: Diagnosis not present

## 2018-09-21 DIAGNOSIS — M5090 Cervical disc disorder, unspecified, unspecified cervical region: Secondary | ICD-10-CM | POA: Diagnosis not present

## 2018-09-21 DIAGNOSIS — I2510 Atherosclerotic heart disease of native coronary artery without angina pectoris: Secondary | ICD-10-CM | POA: Diagnosis not present

## 2018-09-22 DIAGNOSIS — I130 Hypertensive heart and chronic kidney disease with heart failure and stage 1 through stage 4 chronic kidney disease, or unspecified chronic kidney disease: Secondary | ICD-10-CM | POA: Diagnosis not present

## 2018-09-22 DIAGNOSIS — I2510 Atherosclerotic heart disease of native coronary artery without angina pectoris: Secondary | ICD-10-CM | POA: Diagnosis not present

## 2018-09-22 DIAGNOSIS — E785 Hyperlipidemia, unspecified: Secondary | ICD-10-CM | POA: Diagnosis not present

## 2018-09-22 DIAGNOSIS — J849 Interstitial pulmonary disease, unspecified: Secondary | ICD-10-CM | POA: Diagnosis not present

## 2018-09-22 DIAGNOSIS — I5032 Chronic diastolic (congestive) heart failure: Secondary | ICD-10-CM | POA: Diagnosis not present

## 2018-09-22 DIAGNOSIS — E1122 Type 2 diabetes mellitus with diabetic chronic kidney disease: Secondary | ICD-10-CM | POA: Diagnosis not present

## 2018-09-22 DIAGNOSIS — E875 Hyperkalemia: Secondary | ICD-10-CM | POA: Diagnosis not present

## 2018-09-22 DIAGNOSIS — M5090 Cervical disc disorder, unspecified, unspecified cervical region: Secondary | ICD-10-CM | POA: Diagnosis not present

## 2018-09-22 DIAGNOSIS — I480 Paroxysmal atrial fibrillation: Secondary | ICD-10-CM | POA: Diagnosis not present

## 2018-09-22 DIAGNOSIS — N183 Chronic kidney disease, stage 3 (moderate): Secondary | ICD-10-CM | POA: Diagnosis not present

## 2018-09-22 DIAGNOSIS — E1129 Type 2 diabetes mellitus with other diabetic kidney complication: Secondary | ICD-10-CM | POA: Diagnosis not present

## 2018-09-22 DIAGNOSIS — I252 Old myocardial infarction: Secondary | ICD-10-CM | POA: Diagnosis not present

## 2018-09-22 DIAGNOSIS — D631 Anemia in chronic kidney disease: Secondary | ICD-10-CM | POA: Diagnosis not present

## 2018-09-23 DIAGNOSIS — D631 Anemia in chronic kidney disease: Secondary | ICD-10-CM | POA: Diagnosis not present

## 2018-09-23 DIAGNOSIS — E1122 Type 2 diabetes mellitus with diabetic chronic kidney disease: Secondary | ICD-10-CM | POA: Diagnosis not present

## 2018-09-23 DIAGNOSIS — N183 Chronic kidney disease, stage 3 (moderate): Secondary | ICD-10-CM | POA: Diagnosis not present

## 2018-09-23 DIAGNOSIS — E875 Hyperkalemia: Secondary | ICD-10-CM | POA: Diagnosis not present

## 2018-09-24 DIAGNOSIS — I2510 Atherosclerotic heart disease of native coronary artery without angina pectoris: Secondary | ICD-10-CM | POA: Diagnosis not present

## 2018-09-24 DIAGNOSIS — J849 Interstitial pulmonary disease, unspecified: Secondary | ICD-10-CM | POA: Diagnosis not present

## 2018-09-24 DIAGNOSIS — E1122 Type 2 diabetes mellitus with diabetic chronic kidney disease: Secondary | ICD-10-CM | POA: Diagnosis not present

## 2018-09-24 DIAGNOSIS — E785 Hyperlipidemia, unspecified: Secondary | ICD-10-CM | POA: Diagnosis not present

## 2018-09-24 DIAGNOSIS — I480 Paroxysmal atrial fibrillation: Secondary | ICD-10-CM | POA: Diagnosis not present

## 2018-09-24 DIAGNOSIS — M5090 Cervical disc disorder, unspecified, unspecified cervical region: Secondary | ICD-10-CM | POA: Diagnosis not present

## 2018-09-24 DIAGNOSIS — I5032 Chronic diastolic (congestive) heart failure: Secondary | ICD-10-CM | POA: Diagnosis not present

## 2018-09-24 DIAGNOSIS — I130 Hypertensive heart and chronic kidney disease with heart failure and stage 1 through stage 4 chronic kidney disease, or unspecified chronic kidney disease: Secondary | ICD-10-CM | POA: Diagnosis not present

## 2018-09-24 DIAGNOSIS — I252 Old myocardial infarction: Secondary | ICD-10-CM | POA: Diagnosis not present

## 2018-09-24 DIAGNOSIS — N183 Chronic kidney disease, stage 3 (moderate): Secondary | ICD-10-CM | POA: Diagnosis not present

## 2018-09-28 DIAGNOSIS — N183 Chronic kidney disease, stage 3 (moderate): Secondary | ICD-10-CM | POA: Diagnosis not present

## 2018-09-28 DIAGNOSIS — E1122 Type 2 diabetes mellitus with diabetic chronic kidney disease: Secondary | ICD-10-CM | POA: Diagnosis not present

## 2018-09-28 DIAGNOSIS — I5032 Chronic diastolic (congestive) heart failure: Secondary | ICD-10-CM | POA: Diagnosis not present

## 2018-09-28 DIAGNOSIS — I252 Old myocardial infarction: Secondary | ICD-10-CM | POA: Diagnosis not present

## 2018-09-28 DIAGNOSIS — E785 Hyperlipidemia, unspecified: Secondary | ICD-10-CM | POA: Diagnosis not present

## 2018-09-28 DIAGNOSIS — M5090 Cervical disc disorder, unspecified, unspecified cervical region: Secondary | ICD-10-CM | POA: Diagnosis not present

## 2018-09-28 DIAGNOSIS — I130 Hypertensive heart and chronic kidney disease with heart failure and stage 1 through stage 4 chronic kidney disease, or unspecified chronic kidney disease: Secondary | ICD-10-CM | POA: Diagnosis not present

## 2018-09-28 DIAGNOSIS — I2510 Atherosclerotic heart disease of native coronary artery without angina pectoris: Secondary | ICD-10-CM | POA: Diagnosis not present

## 2018-09-28 DIAGNOSIS — J849 Interstitial pulmonary disease, unspecified: Secondary | ICD-10-CM | POA: Diagnosis not present

## 2018-09-28 DIAGNOSIS — I480 Paroxysmal atrial fibrillation: Secondary | ICD-10-CM | POA: Diagnosis not present

## 2018-09-29 DIAGNOSIS — I252 Old myocardial infarction: Secondary | ICD-10-CM | POA: Diagnosis not present

## 2018-09-29 DIAGNOSIS — E538 Deficiency of other specified B group vitamins: Secondary | ICD-10-CM | POA: Diagnosis not present

## 2018-09-29 DIAGNOSIS — I480 Paroxysmal atrial fibrillation: Secondary | ICD-10-CM | POA: Diagnosis not present

## 2018-09-29 DIAGNOSIS — E1122 Type 2 diabetes mellitus with diabetic chronic kidney disease: Secondary | ICD-10-CM | POA: Diagnosis not present

## 2018-09-29 DIAGNOSIS — E1129 Type 2 diabetes mellitus with other diabetic kidney complication: Secondary | ICD-10-CM | POA: Diagnosis not present

## 2018-09-29 DIAGNOSIS — E8809 Other disorders of plasma-protein metabolism, not elsewhere classified: Secondary | ICD-10-CM | POA: Diagnosis not present

## 2018-09-29 DIAGNOSIS — I5032 Chronic diastolic (congestive) heart failure: Secondary | ICD-10-CM | POA: Diagnosis not present

## 2018-09-29 DIAGNOSIS — I129 Hypertensive chronic kidney disease with stage 1 through stage 4 chronic kidney disease, or unspecified chronic kidney disease: Secondary | ICD-10-CM | POA: Diagnosis not present

## 2018-09-29 DIAGNOSIS — I130 Hypertensive heart and chronic kidney disease with heart failure and stage 1 through stage 4 chronic kidney disease, or unspecified chronic kidney disease: Secondary | ICD-10-CM | POA: Diagnosis not present

## 2018-09-29 DIAGNOSIS — E785 Hyperlipidemia, unspecified: Secondary | ICD-10-CM | POA: Diagnosis not present

## 2018-09-29 DIAGNOSIS — N183 Chronic kidney disease, stage 3 (moderate): Secondary | ICD-10-CM | POA: Diagnosis not present

## 2018-09-29 DIAGNOSIS — M5090 Cervical disc disorder, unspecified, unspecified cervical region: Secondary | ICD-10-CM | POA: Diagnosis not present

## 2018-09-29 DIAGNOSIS — J849 Interstitial pulmonary disease, unspecified: Secondary | ICD-10-CM | POA: Diagnosis not present

## 2018-09-29 DIAGNOSIS — I2510 Atherosclerotic heart disease of native coronary artery without angina pectoris: Secondary | ICD-10-CM | POA: Diagnosis not present

## 2018-10-01 DIAGNOSIS — J849 Interstitial pulmonary disease, unspecified: Secondary | ICD-10-CM | POA: Diagnosis not present

## 2018-10-01 DIAGNOSIS — N183 Chronic kidney disease, stage 3 (moderate): Secondary | ICD-10-CM | POA: Diagnosis not present

## 2018-10-01 DIAGNOSIS — I480 Paroxysmal atrial fibrillation: Secondary | ICD-10-CM | POA: Diagnosis not present

## 2018-10-01 DIAGNOSIS — E1122 Type 2 diabetes mellitus with diabetic chronic kidney disease: Secondary | ICD-10-CM | POA: Diagnosis not present

## 2018-10-01 DIAGNOSIS — E785 Hyperlipidemia, unspecified: Secondary | ICD-10-CM | POA: Diagnosis not present

## 2018-10-01 DIAGNOSIS — I5032 Chronic diastolic (congestive) heart failure: Secondary | ICD-10-CM | POA: Diagnosis not present

## 2018-10-01 DIAGNOSIS — I252 Old myocardial infarction: Secondary | ICD-10-CM | POA: Diagnosis not present

## 2018-10-01 DIAGNOSIS — I2510 Atherosclerotic heart disease of native coronary artery without angina pectoris: Secondary | ICD-10-CM | POA: Diagnosis not present

## 2018-10-01 DIAGNOSIS — M5090 Cervical disc disorder, unspecified, unspecified cervical region: Secondary | ICD-10-CM | POA: Diagnosis not present

## 2018-10-01 DIAGNOSIS — I130 Hypertensive heart and chronic kidney disease with heart failure and stage 1 through stage 4 chronic kidney disease, or unspecified chronic kidney disease: Secondary | ICD-10-CM | POA: Diagnosis not present

## 2018-10-02 DIAGNOSIS — E785 Hyperlipidemia, unspecified: Secondary | ICD-10-CM | POA: Diagnosis not present

## 2018-10-02 DIAGNOSIS — J849 Interstitial pulmonary disease, unspecified: Secondary | ICD-10-CM | POA: Diagnosis not present

## 2018-10-02 DIAGNOSIS — I5032 Chronic diastolic (congestive) heart failure: Secondary | ICD-10-CM | POA: Diagnosis not present

## 2018-10-02 DIAGNOSIS — M5090 Cervical disc disorder, unspecified, unspecified cervical region: Secondary | ICD-10-CM | POA: Diagnosis not present

## 2018-10-02 DIAGNOSIS — E1122 Type 2 diabetes mellitus with diabetic chronic kidney disease: Secondary | ICD-10-CM | POA: Diagnosis not present

## 2018-10-02 DIAGNOSIS — I480 Paroxysmal atrial fibrillation: Secondary | ICD-10-CM | POA: Diagnosis not present

## 2018-10-02 DIAGNOSIS — N183 Chronic kidney disease, stage 3 (moderate): Secondary | ICD-10-CM | POA: Diagnosis not present

## 2018-10-02 DIAGNOSIS — I252 Old myocardial infarction: Secondary | ICD-10-CM | POA: Diagnosis not present

## 2018-10-02 DIAGNOSIS — I130 Hypertensive heart and chronic kidney disease with heart failure and stage 1 through stage 4 chronic kidney disease, or unspecified chronic kidney disease: Secondary | ICD-10-CM | POA: Diagnosis not present

## 2018-10-02 DIAGNOSIS — I2510 Atherosclerotic heart disease of native coronary artery without angina pectoris: Secondary | ICD-10-CM | POA: Diagnosis not present

## 2018-10-05 DIAGNOSIS — E1122 Type 2 diabetes mellitus with diabetic chronic kidney disease: Secondary | ICD-10-CM | POA: Diagnosis not present

## 2018-10-05 DIAGNOSIS — I130 Hypertensive heart and chronic kidney disease with heart failure and stage 1 through stage 4 chronic kidney disease, or unspecified chronic kidney disease: Secondary | ICD-10-CM | POA: Diagnosis not present

## 2018-10-05 DIAGNOSIS — M5090 Cervical disc disorder, unspecified, unspecified cervical region: Secondary | ICD-10-CM | POA: Diagnosis not present

## 2018-10-05 DIAGNOSIS — N183 Chronic kidney disease, stage 3 (moderate): Secondary | ICD-10-CM | POA: Diagnosis not present

## 2018-10-05 DIAGNOSIS — J849 Interstitial pulmonary disease, unspecified: Secondary | ICD-10-CM | POA: Diagnosis not present

## 2018-10-05 DIAGNOSIS — I2510 Atherosclerotic heart disease of native coronary artery without angina pectoris: Secondary | ICD-10-CM | POA: Diagnosis not present

## 2018-10-05 DIAGNOSIS — I5032 Chronic diastolic (congestive) heart failure: Secondary | ICD-10-CM | POA: Diagnosis not present

## 2018-10-05 DIAGNOSIS — I252 Old myocardial infarction: Secondary | ICD-10-CM | POA: Diagnosis not present

## 2018-10-05 DIAGNOSIS — E785 Hyperlipidemia, unspecified: Secondary | ICD-10-CM | POA: Diagnosis not present

## 2018-10-05 DIAGNOSIS — I480 Paroxysmal atrial fibrillation: Secondary | ICD-10-CM | POA: Diagnosis not present

## 2018-10-06 DIAGNOSIS — I252 Old myocardial infarction: Secondary | ICD-10-CM | POA: Diagnosis not present

## 2018-10-06 DIAGNOSIS — N183 Chronic kidney disease, stage 3 (moderate): Secondary | ICD-10-CM | POA: Diagnosis not present

## 2018-10-06 DIAGNOSIS — E1122 Type 2 diabetes mellitus with diabetic chronic kidney disease: Secondary | ICD-10-CM | POA: Diagnosis not present

## 2018-10-06 DIAGNOSIS — I5032 Chronic diastolic (congestive) heart failure: Secondary | ICD-10-CM | POA: Diagnosis not present

## 2018-10-06 DIAGNOSIS — R748 Abnormal levels of other serum enzymes: Secondary | ICD-10-CM | POA: Diagnosis not present

## 2018-10-06 DIAGNOSIS — I130 Hypertensive heart and chronic kidney disease with heart failure and stage 1 through stage 4 chronic kidney disease, or unspecified chronic kidney disease: Secondary | ICD-10-CM | POA: Diagnosis not present

## 2018-10-06 DIAGNOSIS — I2510 Atherosclerotic heart disease of native coronary artery without angina pectoris: Secondary | ICD-10-CM | POA: Diagnosis not present

## 2018-10-06 DIAGNOSIS — M5090 Cervical disc disorder, unspecified, unspecified cervical region: Secondary | ICD-10-CM | POA: Diagnosis not present

## 2018-10-06 DIAGNOSIS — I129 Hypertensive chronic kidney disease with stage 1 through stage 4 chronic kidney disease, or unspecified chronic kidney disease: Secondary | ICD-10-CM | POA: Diagnosis not present

## 2018-10-06 DIAGNOSIS — I480 Paroxysmal atrial fibrillation: Secondary | ICD-10-CM | POA: Diagnosis not present

## 2018-10-06 DIAGNOSIS — E785 Hyperlipidemia, unspecified: Secondary | ICD-10-CM | POA: Diagnosis not present

## 2018-10-06 DIAGNOSIS — R001 Bradycardia, unspecified: Secondary | ICD-10-CM | POA: Diagnosis not present

## 2018-10-06 DIAGNOSIS — R635 Abnormal weight gain: Secondary | ICD-10-CM | POA: Diagnosis not present

## 2018-10-06 DIAGNOSIS — J849 Interstitial pulmonary disease, unspecified: Secondary | ICD-10-CM | POA: Diagnosis not present

## 2018-10-07 DIAGNOSIS — E1122 Type 2 diabetes mellitus with diabetic chronic kidney disease: Secondary | ICD-10-CM | POA: Diagnosis not present

## 2018-10-07 DIAGNOSIS — E538 Deficiency of other specified B group vitamins: Secondary | ICD-10-CM | POA: Diagnosis not present

## 2018-10-07 DIAGNOSIS — I129 Hypertensive chronic kidney disease with stage 1 through stage 4 chronic kidney disease, or unspecified chronic kidney disease: Secondary | ICD-10-CM | POA: Diagnosis not present

## 2018-10-07 DIAGNOSIS — R001 Bradycardia, unspecified: Secondary | ICD-10-CM | POA: Diagnosis not present

## 2018-10-08 DIAGNOSIS — N183 Chronic kidney disease, stage 3 (moderate): Secondary | ICD-10-CM | POA: Diagnosis not present

## 2018-10-08 DIAGNOSIS — I130 Hypertensive heart and chronic kidney disease with heart failure and stage 1 through stage 4 chronic kidney disease, or unspecified chronic kidney disease: Secondary | ICD-10-CM | POA: Diagnosis not present

## 2018-10-08 DIAGNOSIS — M5090 Cervical disc disorder, unspecified, unspecified cervical region: Secondary | ICD-10-CM | POA: Diagnosis not present

## 2018-10-08 DIAGNOSIS — J849 Interstitial pulmonary disease, unspecified: Secondary | ICD-10-CM | POA: Diagnosis not present

## 2018-10-08 DIAGNOSIS — I252 Old myocardial infarction: Secondary | ICD-10-CM | POA: Diagnosis not present

## 2018-10-08 DIAGNOSIS — I5032 Chronic diastolic (congestive) heart failure: Secondary | ICD-10-CM | POA: Diagnosis not present

## 2018-10-08 DIAGNOSIS — E785 Hyperlipidemia, unspecified: Secondary | ICD-10-CM | POA: Diagnosis not present

## 2018-10-08 DIAGNOSIS — E1122 Type 2 diabetes mellitus with diabetic chronic kidney disease: Secondary | ICD-10-CM | POA: Diagnosis not present

## 2018-10-08 DIAGNOSIS — I480 Paroxysmal atrial fibrillation: Secondary | ICD-10-CM | POA: Diagnosis not present

## 2018-10-08 DIAGNOSIS — I2510 Atherosclerotic heart disease of native coronary artery without angina pectoris: Secondary | ICD-10-CM | POA: Diagnosis not present

## 2018-10-13 DIAGNOSIS — J849 Interstitial pulmonary disease, unspecified: Secondary | ICD-10-CM | POA: Diagnosis not present

## 2018-10-13 DIAGNOSIS — I2510 Atherosclerotic heart disease of native coronary artery without angina pectoris: Secondary | ICD-10-CM | POA: Diagnosis not present

## 2018-10-13 DIAGNOSIS — F329 Major depressive disorder, single episode, unspecified: Secondary | ICD-10-CM | POA: Diagnosis not present

## 2018-10-13 DIAGNOSIS — E785 Hyperlipidemia, unspecified: Secondary | ICD-10-CM | POA: Diagnosis not present

## 2018-10-13 DIAGNOSIS — I480 Paroxysmal atrial fibrillation: Secondary | ICD-10-CM | POA: Diagnosis not present

## 2018-10-13 DIAGNOSIS — M5090 Cervical disc disorder, unspecified, unspecified cervical region: Secondary | ICD-10-CM | POA: Diagnosis not present

## 2018-10-13 DIAGNOSIS — E1122 Type 2 diabetes mellitus with diabetic chronic kidney disease: Secondary | ICD-10-CM | POA: Diagnosis not present

## 2018-10-13 DIAGNOSIS — I5032 Chronic diastolic (congestive) heart failure: Secondary | ICD-10-CM | POA: Diagnosis not present

## 2018-10-13 DIAGNOSIS — I129 Hypertensive chronic kidney disease with stage 1 through stage 4 chronic kidney disease, or unspecified chronic kidney disease: Secondary | ICD-10-CM | POA: Diagnosis not present

## 2018-10-13 DIAGNOSIS — I130 Hypertensive heart and chronic kidney disease with heart failure and stage 1 through stage 4 chronic kidney disease, or unspecified chronic kidney disease: Secondary | ICD-10-CM | POA: Diagnosis not present

## 2018-10-13 DIAGNOSIS — N183 Chronic kidney disease, stage 3 (moderate): Secondary | ICD-10-CM | POA: Diagnosis not present

## 2018-10-13 DIAGNOSIS — E873 Alkalosis: Secondary | ICD-10-CM | POA: Diagnosis not present

## 2018-10-13 DIAGNOSIS — I252 Old myocardial infarction: Secondary | ICD-10-CM | POA: Diagnosis not present

## 2018-10-14 ENCOUNTER — Other Ambulatory Visit: Payer: Self-pay

## 2018-10-14 ENCOUNTER — Non-Acute Institutional Stay: Payer: Medicare Other | Admitting: Student

## 2018-10-14 VITALS — BP 130/70 | HR 64 | Resp 18 | Wt 184.0 lb

## 2018-10-14 DIAGNOSIS — I480 Paroxysmal atrial fibrillation: Secondary | ICD-10-CM | POA: Diagnosis not present

## 2018-10-14 DIAGNOSIS — I2510 Atherosclerotic heart disease of native coronary artery without angina pectoris: Secondary | ICD-10-CM | POA: Diagnosis not present

## 2018-10-14 DIAGNOSIS — I5032 Chronic diastolic (congestive) heart failure: Secondary | ICD-10-CM | POA: Diagnosis not present

## 2018-10-14 DIAGNOSIS — E1122 Type 2 diabetes mellitus with diabetic chronic kidney disease: Secondary | ICD-10-CM | POA: Diagnosis not present

## 2018-10-14 DIAGNOSIS — J849 Interstitial pulmonary disease, unspecified: Secondary | ICD-10-CM | POA: Diagnosis not present

## 2018-10-14 DIAGNOSIS — Z515 Encounter for palliative care: Secondary | ICD-10-CM

## 2018-10-14 DIAGNOSIS — I252 Old myocardial infarction: Secondary | ICD-10-CM | POA: Diagnosis not present

## 2018-10-14 DIAGNOSIS — N183 Chronic kidney disease, stage 3 (moderate): Secondary | ICD-10-CM | POA: Diagnosis not present

## 2018-10-14 DIAGNOSIS — E785 Hyperlipidemia, unspecified: Secondary | ICD-10-CM | POA: Diagnosis not present

## 2018-10-14 DIAGNOSIS — M5090 Cervical disc disorder, unspecified, unspecified cervical region: Secondary | ICD-10-CM | POA: Diagnosis not present

## 2018-10-14 DIAGNOSIS — I130 Hypertensive heart and chronic kidney disease with heart failure and stage 1 through stage 4 chronic kidney disease, or unspecified chronic kidney disease: Secondary | ICD-10-CM | POA: Diagnosis not present

## 2018-10-14 NOTE — Progress Notes (Signed)
[facility] Consult Note Telephone: [phone]  Fax: [phone]  PATIENT NAME: Justin Leach DOB: February 14, 1932 MRN: [id number]  PRIMARY CARE PROVIDER:   Dr. Durwin Reges  REFERRING PROVIDER:  Dr. Tivis Ringer  RESPONSIBLE PARTY: Wife, Rosendo Couser [phone], Jacqulynn Cadet [phone]  ASSESSMENT:  Justin Leach is resting in recliner upon arrival. NP explained reason for visit Palliative Medicine visit. He welcomes visit. We  Discussed ongoing goals of care and symptom management. Justin Leach did expressed staying at facility long term. He expressed sadness of not being able to see his wife during this time due to covid-19.  He talked about being married for 54 years; emotional support provided. NP spoke with wife Vaughan Basta to discuss visit today; she was appreciative of phone call. She states that nephew Nicole Kindred is HCPOA; she states she believes paperwork regarding patient's wishes was filled out, but lost prior to patient moving to [place]. NP spoke with nephew Nicole Kindred; he states patient is a DNR. NP will confirm that this is on file at facility. They are encouraged to call with questions.     RECOMMENDATIONS and PLAN:  1. Code status: DNR. 2. Medical goals of therapy: Palliative Medicine will continue to provide support; will monitor for changes and declines and make recommendations as needed.  3. Symptom management: Anxiety/depression: patient started on citalopram 20mg  daily by PCP; staff encouraged to monitor for effectiveness. Risk for falls: patient is encouraged to use walker for ambulation.  4. Discharge Planning: Justin Leach will continue to reside at [facility].   Palliative Medicine will follow up in 8 weeks or sooner, if needed.   I spent 25 minutes providing this consultation,  from 10:10am to 10:35am. More than 50% of the time in this consultation was spent coordinating communication.   HISTORY  OF PRESENT ILLNESS:  Justin Leach is a 85 y.o.  male with multiple medical problems including diastolic heart failure, CKD stage 3, type 2 diabetes, hyperkalemia,anemia of chronic disease, hyperlipidemia. Palliative Care was asked to help address goals of care; he is seen for follow up visit today. Justin Leach currently resides at [facility]. He reports doing okay overall. He does express sadness with not being able to see his wife; he does speak with her via phone. He denies pain, dyspnea, nausea or constipation. He reports a fair appetite; he states he usually eats a good breakfast, other meals depends on what is on the menu. His blood sugars have been between 70's to 175mg /dL. No edema; he is on daily weights. Weight today is 184 pounds. He reports sleeping well at night. He was started on citalopram yesterday due to depression per staff. Med tech Santiago Glad reports patient has been depressed about his wife. No recent falls; he has a walker but will walk around room without walker at times. No recent hospitalizations.   CODE STATUS: DNR.  PPS: 40% HOSPICE ELIGIBILITY/DIAGNOSIS: TBD  PAST MEDICAL HISTORY:  Past Medical History:  Diagnosis Date  . Bradycardia    a. during 02/2015 admission - occasional HR in 40s while being treated for atrial fib.  . Cervical disc disease   . Chronic diastolic CHF (congestive heart failure) ([place])    a. Dx 02/2015 -  2D echo 03/02/15 showed severe focal basal hypertrophy of the septum, EF 55-60%, mild MR, no effusion.  . CKD (chronic kidney disease), stage III ([place])   . Coronary artery calcification seen on CT scan  a. Nuc 02/2015 was normal.  . Dyslipidemia   . GERD (gastroesophageal reflux disease)   . Hypertension   . 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 PAF (paroxysmal atrial fibrillation) ([facility])    a. Dx 02/2015 - in and out on tele, rx'd Coumadin and amiodarone.  . Pneumonia 2007   . Shortness of breath dyspnea    WITH EXERTION  . Type II diabetes mellitus ([place])     SOCIAL HX:  Social History   Tobacco Use  . Smoking status: Never Smoker  . Smokeless tobacco: Former Systems developer    Types: Chew  Substance Use Topics  . Alcohol use: No    ALLERGIES:  Allergies  Allergen Reactions  . 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 Patient subsequently taken off amiodarone.  . Nifedipine Other (See Comments)    Reaction: made gums swell up. Pt states that he had to have gum surgery after taking.      PERTINENT MEDICATIONS:  Outpatient Encounter Medications as of 10/14/2018  Medication Sig  . acetaminophen (TYLENOL) 325 MG tablet Take 650 mg by mouth every 6 (six) hours as needed for mild pain or moderate pain.   Marland Kitchen aspirin 81 MG chewable tablet Chew 1 tablet (81 mg total) by mouth daily.  Marland Kitchen atorvastatin (LIPITOR) 40 MG tablet Take 40 mg by mouth at bedtime.   . bisacodyl (DULCOLAX) 10 MG suppository Place 1 suppository (10 mg total) rectally daily as needed for moderate constipation.  . carbamide peroxide (DEBROX) 6.5 % OTIC solution Place 5 drops into both ears 2 (two) times daily. (Patient taking differently: Place 5 drops into both ears 2 (two) times daily as needed. )  . citalopram (CELEXA) 20 MG tablet Take 20 mg by mouth daily.  Marland Kitchen docusate sodium (COLACE) 100 MG capsule Take 1 capsule (100 mg total) by mouth 2 (two) times daily.  . febuxostat (ULORIC) 40 MG tablet Take 40 mg by mouth daily.  . hydrALAZINE (APRESOLINE) 25 MG tablet Take 1 tablet (25 mg total) by mouth every 8 (eight) hours.  . hydrocortisone cream 1 % Apply topically 2 (two) times daily. Apply to itchy areas on the left arm and chest  . insulin glargine (LANTUS) 100 UNIT/ML injection Inject 38 Units into the skin at bedtime.  . isosorbide mononitrate (IMDUR) 60 MG 24 hr tablet Take 1 tablet (60 mg  total) by mouth daily.  Marland Kitchen loperamide (IMODIUM) 2 MG capsule Take 2 mg by mouth every 8 (eight) hours as needed for diarrhea or loose stools.  . metoprolol (LOPRESSOR) 50 MG tablet Take 50 mg by mouth 2 (two) times daily.  . nitroGLYCERIN (NITROSTAT) 0.4 MG SL tablet Place 0.4 mg under the tongue every 5 (five) minutes as needed for chest pain (max 3 doses).   Marland Kitchen omeprazole (PRILOSEC) 20 MG capsule Take 20 mg by mouth daily.   . polyethylene glycol (MIRALAX / GLYCOLAX) packet Take 17 g by mouth daily as needed for mild constipation.  . torsemide (DEMADEX) 20 MG tablet Take 40 mg by mouth daily.   Marland Kitchen albuterol (PROVENTIL) (2.5 MG/3ML) 0.083% nebulizer solution Take 2.5 mg by nebulization every 6 (six) hours as needed for wheezing or shortness of breath.   . insulin lispro (ADMELOG) 100 UNIT/ML injection Inject 8 Units into the skin 3 (three) times daily with meals. Sliding Scale:  000-199:  0u 200-249: 2u 250-299: 4u 300-349: 6u 350-399: 8u 400-449: 10u 450-499: 12u 500+ : contact physician  . NON FORMULARY Diet Type:  NAS, NCS  . oxyCODONE-acetaminophen (PERCOCET/ROXICET) 5-325 MG tablet Take 1 tablet by mouth every 6 (six) hours as needed.  . potassium chloride SA (K-DUR,KLOR-CON) 20 MEQ tablet Take 1 tablet (20 mEq total) by mouth daily. (Patient not taking: Reported on 10/14/2018)  . vitamin B-12 (CYANOCOBALAMIN) 1000 MCG tablet Take 1 tablet (1,000 mcg total) by mouth daily. (Patient not taking: Reported on 10/14/2018)  . [DISCONTINUED] amoxicillin-clavulanate (AUGMENTIN) 500-125 MG tablet Take 1 tablet (500 mg total) by mouth 2 (two) times daily.  . [DISCONTINUED] Insulin Detemir (LEVEMIR FLEXTOUCH) 100 UNIT/ML Pen Inject 18 Units into the skin at bedtime.   No facility-administered encounter medications on file as of 10/14/2018.     PHYSICAL EXAM:   General: NAD,  Cardiovascular: regular rate and rhythm Pulmonary: clear ant fields Abdomen: soft, nontender, + bowel sounds GU: no  suprapubic tenderness Extremities: no edema, no joint deformities Skin: no rashes Neurological: Weakness but otherwise nonfocal  Ezekiel Slocumb, NP

## 2018-10-15 DIAGNOSIS — E785 Hyperlipidemia, unspecified: Secondary | ICD-10-CM | POA: Diagnosis not present

## 2018-10-15 DIAGNOSIS — I252 Old myocardial infarction: Secondary | ICD-10-CM | POA: Diagnosis not present

## 2018-10-15 DIAGNOSIS — I2510 Atherosclerotic heart disease of native coronary artery without angina pectoris: Secondary | ICD-10-CM | POA: Diagnosis not present

## 2018-10-15 DIAGNOSIS — I480 Paroxysmal atrial fibrillation: Secondary | ICD-10-CM | POA: Diagnosis not present

## 2018-10-15 DIAGNOSIS — I5032 Chronic diastolic (congestive) heart failure: Secondary | ICD-10-CM | POA: Diagnosis not present

## 2018-10-15 DIAGNOSIS — N183 Chronic kidney disease, stage 3 (moderate): Secondary | ICD-10-CM | POA: Diagnosis not present

## 2018-10-15 DIAGNOSIS — J849 Interstitial pulmonary disease, unspecified: Secondary | ICD-10-CM | POA: Diagnosis not present

## 2018-10-15 DIAGNOSIS — M5090 Cervical disc disorder, unspecified, unspecified cervical region: Secondary | ICD-10-CM | POA: Diagnosis not present

## 2018-10-15 DIAGNOSIS — I130 Hypertensive heart and chronic kidney disease with heart failure and stage 1 through stage 4 chronic kidney disease, or unspecified chronic kidney disease: Secondary | ICD-10-CM | POA: Diagnosis not present

## 2018-10-15 DIAGNOSIS — E1122 Type 2 diabetes mellitus with diabetic chronic kidney disease: Secondary | ICD-10-CM | POA: Diagnosis not present

## 2018-10-16 DIAGNOSIS — I252 Old myocardial infarction: Secondary | ICD-10-CM | POA: Diagnosis not present

## 2018-10-16 DIAGNOSIS — I5032 Chronic diastolic (congestive) heart failure: Secondary | ICD-10-CM | POA: Diagnosis not present

## 2018-10-16 DIAGNOSIS — I480 Paroxysmal atrial fibrillation: Secondary | ICD-10-CM | POA: Diagnosis not present

## 2018-10-16 DIAGNOSIS — E785 Hyperlipidemia, unspecified: Secondary | ICD-10-CM | POA: Diagnosis not present

## 2018-10-16 DIAGNOSIS — I2510 Atherosclerotic heart disease of native coronary artery without angina pectoris: Secondary | ICD-10-CM | POA: Diagnosis not present

## 2018-10-16 DIAGNOSIS — E1122 Type 2 diabetes mellitus with diabetic chronic kidney disease: Secondary | ICD-10-CM | POA: Diagnosis not present

## 2018-10-16 DIAGNOSIS — J849 Interstitial pulmonary disease, unspecified: Secondary | ICD-10-CM | POA: Diagnosis not present

## 2018-10-16 DIAGNOSIS — I130 Hypertensive heart and chronic kidney disease with heart failure and stage 1 through stage 4 chronic kidney disease, or unspecified chronic kidney disease: Secondary | ICD-10-CM | POA: Diagnosis not present

## 2018-10-16 DIAGNOSIS — M5090 Cervical disc disorder, unspecified, unspecified cervical region: Secondary | ICD-10-CM | POA: Diagnosis not present

## 2018-10-16 DIAGNOSIS — N183 Chronic kidney disease, stage 3 (moderate): Secondary | ICD-10-CM | POA: Diagnosis not present

## 2018-10-17 DIAGNOSIS — N183 Chronic kidney disease, stage 3 (moderate): Secondary | ICD-10-CM | POA: Diagnosis not present

## 2018-10-17 DIAGNOSIS — I129 Hypertensive chronic kidney disease with stage 1 through stage 4 chronic kidney disease, or unspecified chronic kidney disease: Secondary | ICD-10-CM | POA: Diagnosis not present

## 2018-10-17 DIAGNOSIS — E873 Alkalosis: Secondary | ICD-10-CM | POA: Diagnosis not present

## 2018-10-17 DIAGNOSIS — F329 Major depressive disorder, single episode, unspecified: Secondary | ICD-10-CM | POA: Diagnosis not present

## 2018-10-20 DIAGNOSIS — M109 Gout, unspecified: Secondary | ICD-10-CM | POA: Diagnosis not present

## 2018-10-20 DIAGNOSIS — Z8701 Personal history of pneumonia (recurrent): Secondary | ICD-10-CM | POA: Diagnosis not present

## 2018-10-20 DIAGNOSIS — M5090 Cervical disc disorder, unspecified, unspecified cervical region: Secondary | ICD-10-CM | POA: Diagnosis not present

## 2018-10-20 DIAGNOSIS — I5032 Chronic diastolic (congestive) heart failure: Secondary | ICD-10-CM | POA: Diagnosis not present

## 2018-10-20 DIAGNOSIS — Z96612 Presence of left artificial shoulder joint: Secondary | ICD-10-CM | POA: Diagnosis not present

## 2018-10-20 DIAGNOSIS — I480 Paroxysmal atrial fibrillation: Secondary | ICD-10-CM | POA: Diagnosis not present

## 2018-10-20 DIAGNOSIS — F4489 Other dissociative and conversion disorders: Secondary | ICD-10-CM | POA: Diagnosis not present

## 2018-10-20 DIAGNOSIS — Z794 Long term (current) use of insulin: Secondary | ICD-10-CM | POA: Diagnosis not present

## 2018-10-20 DIAGNOSIS — I252 Old myocardial infarction: Secondary | ICD-10-CM | POA: Diagnosis not present

## 2018-10-20 DIAGNOSIS — E7849 Other hyperlipidemia: Secondary | ICD-10-CM | POA: Diagnosis not present

## 2018-10-20 DIAGNOSIS — J849 Interstitial pulmonary disease, unspecified: Secondary | ICD-10-CM | POA: Diagnosis not present

## 2018-10-20 DIAGNOSIS — Z7982 Long term (current) use of aspirin: Secondary | ICD-10-CM | POA: Diagnosis not present

## 2018-10-20 DIAGNOSIS — E785 Hyperlipidemia, unspecified: Secondary | ICD-10-CM | POA: Diagnosis not present

## 2018-10-20 DIAGNOSIS — E1122 Type 2 diabetes mellitus with diabetic chronic kidney disease: Secondary | ICD-10-CM | POA: Diagnosis not present

## 2018-10-20 DIAGNOSIS — I2510 Atherosclerotic heart disease of native coronary artery without angina pectoris: Secondary | ICD-10-CM | POA: Diagnosis not present

## 2018-10-20 DIAGNOSIS — E1129 Type 2 diabetes mellitus with other diabetic kidney complication: Secondary | ICD-10-CM | POA: Diagnosis not present

## 2018-10-20 DIAGNOSIS — N183 Chronic kidney disease, stage 3 (moderate): Secondary | ICD-10-CM | POA: Diagnosis not present

## 2018-10-20 DIAGNOSIS — I130 Hypertensive heart and chronic kidney disease with heart failure and stage 1 through stage 4 chronic kidney disease, or unspecified chronic kidney disease: Secondary | ICD-10-CM | POA: Diagnosis not present

## 2018-10-20 DIAGNOSIS — Z9181 History of falling: Secondary | ICD-10-CM | POA: Diagnosis not present

## 2018-10-21 DIAGNOSIS — I480 Paroxysmal atrial fibrillation: Secondary | ICD-10-CM | POA: Diagnosis not present

## 2018-10-21 DIAGNOSIS — N183 Chronic kidney disease, stage 3 (moderate): Secondary | ICD-10-CM | POA: Diagnosis not present

## 2018-10-21 DIAGNOSIS — M109 Gout, unspecified: Secondary | ICD-10-CM | POA: Diagnosis not present

## 2018-10-21 DIAGNOSIS — I2510 Atherosclerotic heart disease of native coronary artery without angina pectoris: Secondary | ICD-10-CM | POA: Diagnosis not present

## 2018-10-21 DIAGNOSIS — F4489 Other dissociative and conversion disorders: Secondary | ICD-10-CM | POA: Diagnosis not present

## 2018-10-21 DIAGNOSIS — M5090 Cervical disc disorder, unspecified, unspecified cervical region: Secondary | ICD-10-CM | POA: Diagnosis not present

## 2018-10-21 DIAGNOSIS — E7849 Other hyperlipidemia: Secondary | ICD-10-CM | POA: Diagnosis not present

## 2018-10-21 DIAGNOSIS — I252 Old myocardial infarction: Secondary | ICD-10-CM | POA: Diagnosis not present

## 2018-10-21 DIAGNOSIS — Z794 Long term (current) use of insulin: Secondary | ICD-10-CM | POA: Diagnosis not present

## 2018-10-21 DIAGNOSIS — J849 Interstitial pulmonary disease, unspecified: Secondary | ICD-10-CM | POA: Diagnosis not present

## 2018-10-21 DIAGNOSIS — E785 Hyperlipidemia, unspecified: Secondary | ICD-10-CM | POA: Diagnosis not present

## 2018-10-21 DIAGNOSIS — I130 Hypertensive heart and chronic kidney disease with heart failure and stage 1 through stage 4 chronic kidney disease, or unspecified chronic kidney disease: Secondary | ICD-10-CM | POA: Diagnosis not present

## 2018-10-21 DIAGNOSIS — Z7982 Long term (current) use of aspirin: Secondary | ICD-10-CM | POA: Diagnosis not present

## 2018-10-21 DIAGNOSIS — Z96612 Presence of left artificial shoulder joint: Secondary | ICD-10-CM | POA: Diagnosis not present

## 2018-10-21 DIAGNOSIS — Z8701 Personal history of pneumonia (recurrent): Secondary | ICD-10-CM | POA: Diagnosis not present

## 2018-10-21 DIAGNOSIS — E1122 Type 2 diabetes mellitus with diabetic chronic kidney disease: Secondary | ICD-10-CM | POA: Diagnosis not present

## 2018-10-21 DIAGNOSIS — Z9181 History of falling: Secondary | ICD-10-CM | POA: Diagnosis not present

## 2018-10-21 DIAGNOSIS — I5032 Chronic diastolic (congestive) heart failure: Secondary | ICD-10-CM | POA: Diagnosis not present

## 2018-10-21 DIAGNOSIS — R197 Diarrhea, unspecified: Secondary | ICD-10-CM | POA: Diagnosis not present

## 2018-10-22 DIAGNOSIS — I2510 Atherosclerotic heart disease of native coronary artery without angina pectoris: Secondary | ICD-10-CM | POA: Diagnosis not present

## 2018-10-22 DIAGNOSIS — Z794 Long term (current) use of insulin: Secondary | ICD-10-CM | POA: Diagnosis not present

## 2018-10-22 DIAGNOSIS — E785 Hyperlipidemia, unspecified: Secondary | ICD-10-CM | POA: Diagnosis not present

## 2018-10-22 DIAGNOSIS — Z96612 Presence of left artificial shoulder joint: Secondary | ICD-10-CM | POA: Diagnosis not present

## 2018-10-22 DIAGNOSIS — I480 Paroxysmal atrial fibrillation: Secondary | ICD-10-CM | POA: Diagnosis not present

## 2018-10-22 DIAGNOSIS — J849 Interstitial pulmonary disease, unspecified: Secondary | ICD-10-CM | POA: Diagnosis not present

## 2018-10-22 DIAGNOSIS — I252 Old myocardial infarction: Secondary | ICD-10-CM | POA: Diagnosis not present

## 2018-10-22 DIAGNOSIS — N183 Chronic kidney disease, stage 3 (moderate): Secondary | ICD-10-CM | POA: Diagnosis not present

## 2018-10-22 DIAGNOSIS — R197 Diarrhea, unspecified: Secondary | ICD-10-CM | POA: Diagnosis not present

## 2018-10-22 DIAGNOSIS — I509 Heart failure, unspecified: Secondary | ICD-10-CM | POA: Diagnosis not present

## 2018-10-22 DIAGNOSIS — I5032 Chronic diastolic (congestive) heart failure: Secondary | ICD-10-CM | POA: Diagnosis not present

## 2018-10-22 DIAGNOSIS — M5090 Cervical disc disorder, unspecified, unspecified cervical region: Secondary | ICD-10-CM | POA: Diagnosis not present

## 2018-10-22 DIAGNOSIS — Z9181 History of falling: Secondary | ICD-10-CM | POA: Diagnosis not present

## 2018-10-22 DIAGNOSIS — I130 Hypertensive heart and chronic kidney disease with heart failure and stage 1 through stage 4 chronic kidney disease, or unspecified chronic kidney disease: Secondary | ICD-10-CM | POA: Diagnosis not present

## 2018-10-22 DIAGNOSIS — J8410 Pulmonary fibrosis, unspecified: Secondary | ICD-10-CM | POA: Diagnosis not present

## 2018-10-22 DIAGNOSIS — M109 Gout, unspecified: Secondary | ICD-10-CM | POA: Diagnosis not present

## 2018-10-22 DIAGNOSIS — I4891 Unspecified atrial fibrillation: Secondary | ICD-10-CM | POA: Diagnosis not present

## 2018-10-22 DIAGNOSIS — E1122 Type 2 diabetes mellitus with diabetic chronic kidney disease: Secondary | ICD-10-CM | POA: Diagnosis not present

## 2018-10-22 DIAGNOSIS — Z8701 Personal history of pneumonia (recurrent): Secondary | ICD-10-CM | POA: Diagnosis not present

## 2018-10-22 DIAGNOSIS — Z7982 Long term (current) use of aspirin: Secondary | ICD-10-CM | POA: Diagnosis not present

## 2018-10-27 DIAGNOSIS — J849 Interstitial pulmonary disease, unspecified: Secondary | ICD-10-CM | POA: Diagnosis not present

## 2018-10-27 DIAGNOSIS — I130 Hypertensive heart and chronic kidney disease with heart failure and stage 1 through stage 4 chronic kidney disease, or unspecified chronic kidney disease: Secondary | ICD-10-CM | POA: Diagnosis not present

## 2018-10-27 DIAGNOSIS — Z9181 History of falling: Secondary | ICD-10-CM | POA: Diagnosis not present

## 2018-10-27 DIAGNOSIS — I2510 Atherosclerotic heart disease of native coronary artery without angina pectoris: Secondary | ICD-10-CM | POA: Diagnosis not present

## 2018-10-27 DIAGNOSIS — E785 Hyperlipidemia, unspecified: Secondary | ICD-10-CM | POA: Diagnosis not present

## 2018-10-27 DIAGNOSIS — M109 Gout, unspecified: Secondary | ICD-10-CM | POA: Diagnosis not present

## 2018-10-27 DIAGNOSIS — N183 Chronic kidney disease, stage 3 (moderate): Secondary | ICD-10-CM | POA: Diagnosis not present

## 2018-10-27 DIAGNOSIS — E1129 Type 2 diabetes mellitus with other diabetic kidney complication: Secondary | ICD-10-CM | POA: Diagnosis not present

## 2018-10-27 DIAGNOSIS — Z96612 Presence of left artificial shoulder joint: Secondary | ICD-10-CM | POA: Diagnosis not present

## 2018-10-27 DIAGNOSIS — K219 Gastro-esophageal reflux disease without esophagitis: Secondary | ICD-10-CM | POA: Diagnosis not present

## 2018-10-27 DIAGNOSIS — I252 Old myocardial infarction: Secondary | ICD-10-CM | POA: Diagnosis not present

## 2018-10-27 DIAGNOSIS — I480 Paroxysmal atrial fibrillation: Secondary | ICD-10-CM | POA: Diagnosis not present

## 2018-10-27 DIAGNOSIS — Z8701 Personal history of pneumonia (recurrent): Secondary | ICD-10-CM | POA: Diagnosis not present

## 2018-10-27 DIAGNOSIS — E1122 Type 2 diabetes mellitus with diabetic chronic kidney disease: Secondary | ICD-10-CM | POA: Diagnosis not present

## 2018-10-27 DIAGNOSIS — F329 Major depressive disorder, single episode, unspecified: Secondary | ICD-10-CM | POA: Diagnosis not present

## 2018-10-27 DIAGNOSIS — Z7982 Long term (current) use of aspirin: Secondary | ICD-10-CM | POA: Diagnosis not present

## 2018-10-27 DIAGNOSIS — N390 Urinary tract infection, site not specified: Secondary | ICD-10-CM | POA: Diagnosis not present

## 2018-10-27 DIAGNOSIS — M5090 Cervical disc disorder, unspecified, unspecified cervical region: Secondary | ICD-10-CM | POA: Diagnosis not present

## 2018-10-27 DIAGNOSIS — Z794 Long term (current) use of insulin: Secondary | ICD-10-CM | POA: Diagnosis not present

## 2018-10-27 DIAGNOSIS — I5032 Chronic diastolic (congestive) heart failure: Secondary | ICD-10-CM | POA: Diagnosis not present

## 2018-10-29 DIAGNOSIS — Z7982 Long term (current) use of aspirin: Secondary | ICD-10-CM | POA: Diagnosis not present

## 2018-10-29 DIAGNOSIS — M5090 Cervical disc disorder, unspecified, unspecified cervical region: Secondary | ICD-10-CM | POA: Diagnosis not present

## 2018-10-29 DIAGNOSIS — Z794 Long term (current) use of insulin: Secondary | ICD-10-CM | POA: Diagnosis not present

## 2018-10-29 DIAGNOSIS — M109 Gout, unspecified: Secondary | ICD-10-CM | POA: Diagnosis not present

## 2018-10-29 DIAGNOSIS — I252 Old myocardial infarction: Secondary | ICD-10-CM | POA: Diagnosis not present

## 2018-10-29 DIAGNOSIS — Z96612 Presence of left artificial shoulder joint: Secondary | ICD-10-CM | POA: Diagnosis not present

## 2018-10-29 DIAGNOSIS — Z9181 History of falling: Secondary | ICD-10-CM | POA: Diagnosis not present

## 2018-10-29 DIAGNOSIS — N183 Chronic kidney disease, stage 3 (moderate): Secondary | ICD-10-CM | POA: Diagnosis not present

## 2018-10-29 DIAGNOSIS — I2510 Atherosclerotic heart disease of native coronary artery without angina pectoris: Secondary | ICD-10-CM | POA: Diagnosis not present

## 2018-10-29 DIAGNOSIS — I5032 Chronic diastolic (congestive) heart failure: Secondary | ICD-10-CM | POA: Diagnosis not present

## 2018-10-29 DIAGNOSIS — I480 Paroxysmal atrial fibrillation: Secondary | ICD-10-CM | POA: Diagnosis not present

## 2018-10-29 DIAGNOSIS — E785 Hyperlipidemia, unspecified: Secondary | ICD-10-CM | POA: Diagnosis not present

## 2018-10-29 DIAGNOSIS — I130 Hypertensive heart and chronic kidney disease with heart failure and stage 1 through stage 4 chronic kidney disease, or unspecified chronic kidney disease: Secondary | ICD-10-CM | POA: Diagnosis not present

## 2018-10-29 DIAGNOSIS — E1122 Type 2 diabetes mellitus with diabetic chronic kidney disease: Secondary | ICD-10-CM | POA: Diagnosis not present

## 2018-10-29 DIAGNOSIS — J849 Interstitial pulmonary disease, unspecified: Secondary | ICD-10-CM | POA: Diagnosis not present

## 2018-10-29 DIAGNOSIS — Z8701 Personal history of pneumonia (recurrent): Secondary | ICD-10-CM | POA: Diagnosis not present

## 2018-10-31 DIAGNOSIS — N390 Urinary tract infection, site not specified: Secondary | ICD-10-CM | POA: Diagnosis not present

## 2018-10-31 DIAGNOSIS — F329 Major depressive disorder, single episode, unspecified: Secondary | ICD-10-CM | POA: Diagnosis not present

## 2018-10-31 DIAGNOSIS — K219 Gastro-esophageal reflux disease without esophagitis: Secondary | ICD-10-CM | POA: Diagnosis not present

## 2018-10-31 DIAGNOSIS — E1122 Type 2 diabetes mellitus with diabetic chronic kidney disease: Secondary | ICD-10-CM | POA: Diagnosis not present

## 2018-11-03 DIAGNOSIS — R2681 Unsteadiness on feet: Secondary | ICD-10-CM | POA: Diagnosis not present

## 2018-11-03 DIAGNOSIS — E7849 Other hyperlipidemia: Secondary | ICD-10-CM | POA: Diagnosis not present

## 2018-11-03 DIAGNOSIS — N183 Chronic kidney disease, stage 3 (moderate): Secondary | ICD-10-CM | POA: Diagnosis not present

## 2018-11-03 DIAGNOSIS — I129 Hypertensive chronic kidney disease with stage 1 through stage 4 chronic kidney disease, or unspecified chronic kidney disease: Secondary | ICD-10-CM | POA: Diagnosis not present

## 2018-11-05 DIAGNOSIS — Z79899 Other long term (current) drug therapy: Secondary | ICD-10-CM | POA: Diagnosis not present

## 2018-11-05 DIAGNOSIS — R0602 Shortness of breath: Secondary | ICD-10-CM | POA: Diagnosis not present

## 2018-11-05 DIAGNOSIS — R062 Wheezing: Secondary | ICD-10-CM | POA: Diagnosis not present

## 2018-11-05 DIAGNOSIS — J181 Lobar pneumonia, unspecified organism: Secondary | ICD-10-CM | POA: Diagnosis not present

## 2018-11-05 DIAGNOSIS — R2681 Unsteadiness on feet: Secondary | ICD-10-CM | POA: Diagnosis not present

## 2018-11-05 DIAGNOSIS — E162 Hypoglycemia, unspecified: Secondary | ICD-10-CM | POA: Diagnosis not present

## 2018-11-05 DIAGNOSIS — R109 Unspecified abdominal pain: Secondary | ICD-10-CM | POA: Diagnosis not present

## 2018-11-05 DIAGNOSIS — I129 Hypertensive chronic kidney disease with stage 1 through stage 4 chronic kidney disease, or unspecified chronic kidney disease: Secondary | ICD-10-CM | POA: Diagnosis not present

## 2018-11-06 DIAGNOSIS — J181 Lobar pneumonia, unspecified organism: Secondary | ICD-10-CM | POA: Diagnosis not present

## 2018-11-06 DIAGNOSIS — E162 Hypoglycemia, unspecified: Secondary | ICD-10-CM | POA: Diagnosis not present

## 2018-11-06 DIAGNOSIS — N183 Chronic kidney disease, stage 3 (moderate): Secondary | ICD-10-CM | POA: Diagnosis not present

## 2018-11-06 DIAGNOSIS — R2681 Unsteadiness on feet: Secondary | ICD-10-CM | POA: Diagnosis not present

## 2018-11-08 ENCOUNTER — Emergency Department
Admission: EM | Admit: 2018-11-08 | Discharge: 2018-11-08 | Disposition: A | Discharge status: Skilled Nursing Facility | Payer: Medicare Other | Attending: Emergency Medicine | Admitting: Emergency Medicine

## 2018-11-08 ENCOUNTER — Other Ambulatory Visit: Payer: Self-pay

## 2018-11-08 ENCOUNTER — Emergency Department: Payer: Medicare Other

## 2018-11-08 DIAGNOSIS — Y92128 Other place in nursing home as the place of occurrence of the external cause: Secondary | ICD-10-CM | POA: Diagnosis not present

## 2018-11-08 DIAGNOSIS — I5032 Chronic diastolic (congestive) heart failure: Secondary | ICD-10-CM | POA: Diagnosis not present

## 2018-11-08 DIAGNOSIS — Z79899 Other long term (current) drug therapy: Secondary | ICD-10-CM | POA: Insufficient documentation

## 2018-11-08 DIAGNOSIS — E1122 Type 2 diabetes mellitus with diabetic chronic kidney disease: Secondary | ICD-10-CM | POA: Insufficient documentation

## 2018-11-08 DIAGNOSIS — R5381 Other malaise: Secondary | ICD-10-CM | POA: Diagnosis not present

## 2018-11-08 DIAGNOSIS — Z87891 Personal history of nicotine dependence: Secondary | ICD-10-CM | POA: Insufficient documentation

## 2018-11-08 DIAGNOSIS — Z794 Long term (current) use of insulin: Secondary | ICD-10-CM | POA: Insufficient documentation

## 2018-11-08 DIAGNOSIS — E860 Dehydration: Secondary | ICD-10-CM | POA: Diagnosis not present

## 2018-11-08 DIAGNOSIS — S0990XA Unspecified injury of head, initial encounter: Secondary | ICD-10-CM | POA: Diagnosis not present

## 2018-11-08 DIAGNOSIS — Y999 Unspecified external cause status: Secondary | ICD-10-CM | POA: Diagnosis not present

## 2018-11-08 DIAGNOSIS — R279 Unspecified lack of coordination: Secondary | ICD-10-CM | POA: Diagnosis not present

## 2018-11-08 DIAGNOSIS — S299XXA Unspecified injury of thorax, initial encounter: Secondary | ICD-10-CM | POA: Diagnosis not present

## 2018-11-08 DIAGNOSIS — R52 Pain, unspecified: Secondary | ICD-10-CM | POA: Diagnosis not present

## 2018-11-08 DIAGNOSIS — R079 Chest pain, unspecified: Secondary | ICD-10-CM | POA: Diagnosis not present

## 2018-11-08 DIAGNOSIS — I130 Hypertensive heart and chronic kidney disease with heart failure and stage 1 through stage 4 chronic kidney disease, or unspecified chronic kidney disease: Secondary | ICD-10-CM | POA: Insufficient documentation

## 2018-11-08 DIAGNOSIS — Z20828 Contact with and (suspected) exposure to other viral communicable diseases: Secondary | ICD-10-CM | POA: Diagnosis not present

## 2018-11-08 DIAGNOSIS — N183 Chronic kidney disease, stage 3 (moderate): Secondary | ICD-10-CM | POA: Diagnosis not present

## 2018-11-08 DIAGNOSIS — W19XXXA Unspecified fall, initial encounter: Secondary | ICD-10-CM | POA: Diagnosis not present

## 2018-11-08 DIAGNOSIS — Z7982 Long term (current) use of aspirin: Secondary | ICD-10-CM | POA: Insufficient documentation

## 2018-11-08 DIAGNOSIS — R0781 Pleurodynia: Secondary | ICD-10-CM | POA: Diagnosis not present

## 2018-11-08 DIAGNOSIS — Y939 Activity, unspecified: Secondary | ICD-10-CM | POA: Insufficient documentation

## 2018-11-08 DIAGNOSIS — Z743 Need for continuous supervision: Secondary | ICD-10-CM | POA: Diagnosis not present

## 2018-11-08 DIAGNOSIS — R0689 Other abnormalities of breathing: Secondary | ICD-10-CM | POA: Diagnosis not present

## 2018-11-08 DIAGNOSIS — R0902 Hypoxemia: Secondary | ICD-10-CM | POA: Diagnosis not present

## 2018-11-08 LAB — CBC
HCT: 41.2 % (ref 39.0–52.0)
Hemoglobin: 13.1 g/dL (ref 13.0–17.0)
MCH: 30 pg (ref 26.0–34.0)
MCHC: 31.8 g/dL (ref 30.0–36.0)
MCV: 94.5 fL (ref 80.0–100.0)
Platelets: 259 10*3/uL (ref 150–400)
RBC: 4.36 MIL/uL (ref 4.22–5.81)
RDW: 14.4 % (ref 11.5–15.5)
WBC: 12 10*3/uL — ABNORMAL HIGH (ref 4.0–10.5)
nRBC: 0 % (ref 0.0–0.2)

## 2018-11-08 LAB — URINALYSIS, COMPLETE (UACMP) WITH MICROSCOPIC
Bacteria, UA: NONE SEEN
Bilirubin Urine: NEGATIVE
Glucose, UA: NEGATIVE mg/dL
Hgb urine dipstick: NEGATIVE
Ketones, ur: NEGATIVE mg/dL
Leukocytes,Ua: NEGATIVE
Nitrite: NEGATIVE
Protein, ur: NEGATIVE mg/dL
Specific Gravity, Urine: 1.006 (ref 1.005–1.030)
Squamous Epithelial / HPF: NONE SEEN (ref 0–5)
WBC, UA: NONE SEEN WBC/hpf (ref 0–5)
pH: 7 (ref 5.0–8.0)

## 2018-11-08 LAB — BASIC METABOLIC PANEL
Anion gap: 7 (ref 5–15)
BUN: 38 mg/dL — ABNORMAL HIGH (ref 8–23)
CO2: 29 mmol/L (ref 22–32)
Calcium: 9.1 mg/dL (ref 8.9–10.3)
Chloride: 101 mmol/L (ref 98–111)
Creatinine, Ser: 2.37 mg/dL — ABNORMAL HIGH (ref 0.61–1.24)
GFR calc Af Amer: 28 mL/min — ABNORMAL LOW (ref >60)
GFR calc non Af Amer: 24 mL/min — ABNORMAL LOW (ref >60)
Glucose, Bld: 132 mg/dL — ABNORMAL HIGH (ref 70–99)
Potassium: 5.6 mmol/L — ABNORMAL HIGH (ref 3.5–5.1)
Sodium: 137 mmol/L (ref 135–145)

## 2018-11-08 LAB — SARS CORONAVIRUS 2 BY RT PCR (HOSPITAL ORDER, PERFORMED IN ~~LOC~~ HOSPITAL LAB): SARS Coronavirus 2: NEGATIVE

## 2018-11-08 LAB — TROPONIN I: Troponin I: <0.03 ng/mL (ref <0.03)

## 2018-11-08 MED ORDER — SODIUM CHLORIDE 0.9 % IV BOLUS
1000.0000 mL | Freq: Once | INTRAVENOUS | Status: AC
  Administered 2018-11-08: 1000 mL via INTRAVENOUS

## 2018-11-08 MED ORDER — SODIUM CHLORIDE 0.9% FLUSH
3.0000 mL | Freq: Once | INTRAVENOUS | Status: AC
  Administered 2018-11-08: 11:00:00 3 mL via INTRAVENOUS

## 2018-11-08 NOTE — ED Notes (Signed)
Patient transported to CT 

## 2018-11-08 NOTE — ED Notes (Addendum)
Pt states he feels like he has to have a BM, pt placed on bedpan at this time.

## 2018-11-08 NOTE — ED Triage Notes (Addendum)
EMS from [place]  unwitnessed mechanical fall last night  ambulated with cane this AM c/o of difficulty breathing. states landed on abdomen. did not hit head, no LOC. no blood thinners 89% RA, placed on 4 L and came up to 97%.  20G R AC. VSS. CBG 160.  A&O upon arrival.   Pt states frequent falls. States tripped. Uses walker. Denies dizziness or weakness. States fell on christmas and had to had L shoulder surgery from it. Pt also c/o diarrhea.

## 2018-11-08 NOTE — ED Provider Notes (Signed)
[facility] Emergency Department Provider Note  Time seen: 10:45 AM  I have reviewed the triage vital signs and the nursing notes.   HISTORY  Chief Complaint Fall   HPI Justin Leach is a 85 y.o. male with a past medical history of CHF, CKD, gastric reflux, paroxysmal atrial fibrillation on aspirin who presents to the emergency department after a fall.   According to EMS report patient lives at [facility], had an unwitnessed fall last night.  Today the patient was complaining of some chest pain, found to be hypoxic 89% on room air with no reported O2 requirement.  Patient denies any fever cough, does state generalized weakness but states this is chronic, states he has fallen in the past due to weakness as well.  Denies any vomiting.  Does state diarrhea for the past several weeks.  Denies any dysuria.  Past Medical History:  Diagnosis Date  . Bradycardia    a. during 02/2015 admission - occasional HR in 40s while being treated for atrial fib.  . Cervical disc disease   . Chronic diastolic CHF (congestive heart failure) ([place])    a. Dx 02/2015 -  2D echo 03/02/15 showed severe focal basal hypertrophy of the septum, EF 55-60%, mild MR, no effusion.  . CKD (chronic kidney disease), stage III ([place])   . Coronary artery calcification seen on CT scan    a. Nuc 02/2015 was normal.  . Dyslipidemia   . GERD (gastroesophageal reflux disease)   . Hypertension   . 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 PAF (paroxysmal atrial fibrillation) ([place])    a. Dx 02/2015 - in and out on tele, rx'd Coumadin and amiodarone.  . Pneumonia 2007  . Shortness of breath dyspnea    WITH EXERTION  . Type II diabetes mellitus [facility])     Patient Active Problem List   Diagnosis Date Noted  . Interstitial pulmonary disease ([place]) 07/29/2018  . Pneumonia 07/20/2018  . Palliative care encounter 06/26/2018  . Weakness generalized  06/26/2018  . HCAP (healthcare-associated pneumonia) 06/22/2018  . Gouty arthritis of right great toe 06/05/2018  . Hypertensive heart and kidney disease with chronic diastolic congestive heart failure and stage 3 chronic kidney disease ([place]) 06/01/2018  . Dyslipidemia associated with type 2 diabetes mellitus ([place]) 06/01/2018  . Chronic constipation 06/01/2018  . CKD stage 3 due to type 2 diabetes mellitus ([place]) 06/01/2018  . Volume overload 05/20/2018  . Fall 05/13/2018  . SDH (subdural hematoma) ([place]) 05/13/2018  . Closed comminuted left humeral fracture 05/13/2018  . Hyperkalemia 05/13/2018  . Leukocytosis 05/13/2018  . Fall at home   . Subdural hematoma without coma ([place])   . Cough   . GERD (gastroesophageal reflux disease)   . Pulmonary fibrosis ([place])   . Coronary artery calcification seen on CT scan   . Chronic diastolic CHF (congestive heart failure) ([place])   . PAF (paroxysmal atrial fibrillation) ([place])   . CKD (chronic kidney disease), stage III ([place])   . Type II diabetes mellitus with renal manifestations ([place])   . Essential hypertension   . Chest pain 02/28/2015  . Renal failure (ARF), acute on chronic ([place]) 02/28/2015  . Hyperlipidemia 02/28/2015  . Dyspnea 03/29/2014  . DOE (dyspnea on exertion) 03/29/2014  . Bradycardia 03/29/2014  . Bronchospasm 05/15/2013    Past Surgical History:  Procedure Laterality Date  . CATARACT EXTRACTION W/ INTRAOCULAR LENS  IMPLANT, BILATERAL Bilateral   .  CERVICAL DISC SURGERY     "i've had 3 neck ORs; not sure what kind; all thru the back of my neck"  . CHOLECYSTECTOMY    . COLONOSCOPY WITH PROPOFOL N/A 01/02/2016   Procedure: COLONOSCOPY WITH PROPOFOL;  Surgeon: [name], MD;  Location: [facility];  Service: Endoscopy;  Laterality: N/A;  . EYE SURGERY  1930's   "don't know what for" (05/11/2013)  . JOINT REPLACEMENT     bilateral knees, elbows, and shoulders (on 05/11/2013 pt denies all joint replacements"   . KNEE  ARTHROPLASTY     "had one scope; one open knee OR; not sure which on which side" (05/10/2013)  . KNEE ARTHROSCOPY     "had one scope; one open knee OR; not sure which on which side" (05/10/2013)  . POSTERIOR FUSION CERVICAL SPINE    . REVERSE SHOULDER ARTHROPLASTY Left 05/19/2018   Procedure: REVERSE SHOULDER ARTHROPLASTY;  Surgeon: [name], MD;  Location: [place] [facility];  Service: Orthopedics;  Laterality: Left;  . ROTATOR CUFF REPAIR Bilateral   . VIDEO BRONCHOSCOPY Bilateral 02/01/2016   Procedure: VIDEO BRONCHOSCOPY WITHOUT FLUORO;  Surgeon: Marshell [name], MD;  Location: [facility];  Service: Cardiopulmonary;  Laterality: Bilateral;    Prior to Admission medications   Medication Sig Start Date End Date Taking? Authorizing Provider  acetaminophen (TYLENOL) 325 MG tablet Take 650 mg by mouth every 6 (six) hours as needed for mild pain or moderate pain.  06/11/18   [provider]  albuterol (PROVENTIL) (2.5 MG/3ML) 0.083% nebulizer solution Take 2.5 mg by nebulization every 6 (six) hours as needed for wheezing or shortness of breath.  05/27/18   [provider]  aspirin 81 MG chewable tablet Chew 1 tablet (81 mg total) by mouth daily. 07/23/18   Loletha Grayer, MD  atorvastatin (LIPITOR) 40 MG tablet Take 40 mg by mouth at bedtime.  05/29/18   [provider]  bisacodyl (DULCOLAX) 10 MG suppository Place 1 suppository (10 mg total) rectally daily as needed for moderate constipation. 05/27/18   Rai, Ripudeep K, MD  carbamide peroxide (DEBROX) 6.5 % OTIC solution Place 5 drops into both ears 2 (two) times daily. Patient taking differently: Place 5 drops into both ears 2 (two) times daily as needed.  07/23/18   Loletha Grayer, MD  citalopram (CELEXA) 20 MG tablet Take 20 mg by mouth daily.    [provider]  docusate sodium (COLACE) 100 MG capsule Take 1 capsule (100 mg total) by mouth 2 (two) times daily. 05/27/18   Rai, Vernelle Emerald, MD  febuxostat (ULORIC)  40 MG tablet Take 40 mg by mouth daily. 06/02/18   [provider]  hydrALAZINE (APRESOLINE) 25 MG tablet Take 1 tablet (25 mg total) by mouth every 8 (eight) hours. 05/27/18   Rai, Vernelle Emerald, MD  hydrocortisone cream 1 % Apply topically 2 (two) times daily. Apply to itchy areas on the left arm and chest 05/27/18   Rai, Ripudeep K, MD  insulin glargine (LANTUS) 100 UNIT/ML injection Inject 38 Units into the skin at bedtime.    [provider]  insulin lispro (ADMELOG) 100 UNIT/ML injection Inject 8 Units into the skin 3 (three) times daily with meals. Sliding Scale:  000-199: 0u 200-249: 2u 250-299: 4u 300-349: 6u 350-399: 8u 400-449: 10u 450-499: 12u 500+ : contact physician    [provider]  isosorbide mononitrate (IMDUR) 60 MG 24 hr tablet Take 1 tablet (60 mg total) by mouth daily. 07/23/18   [place],  Richard, MD  loperamide (IMODIUM) 2 MG capsule Take 2 mg by mouth every 8 (eight) hours as needed for diarrhea or loose stools.    [provider]  metoprolol (LOPRESSOR) 50 MG tablet Take 50 mg by mouth 2 (two) times daily.    [provider]  nitroGLYCERIN (NITROSTAT) 0.4 MG SL tablet Place 0.4 mg under the tongue every 5 (five) minutes as needed for chest pain (max 3 doses).  07/20/18   [provider]  NON FORMULARY Diet Type:  NAS, NCS 05/27/18   [provider]  omeprazole (PRILOSEC) 20 MG capsule Take 20 mg by mouth daily.  02/26/13   [provider]  oxyCODONE-acetaminophen (PERCOCET/ROXICET) 5-325 MG tablet Take 1 tablet by mouth every 6 (six) hours as needed. 07/23/18   Loletha Grayer, MD  polyethylene glycol (MIRALAX / GLYCOLAX) packet Take 17 g by mouth daily as needed for mild constipation. 05/27/18   Rai, Ripudeep K, MD  potassium chloride SA (K-DUR,KLOR-CON) 20 MEQ tablet Take 1 tablet (20 mEq total) by mouth daily. Patient not taking: Reported on 10/14/2018 07/23/18   Loletha Grayer, MD  torsemide (DEMADEX) 20 MG  tablet Take 40 mg by mouth daily.  07/16/18   [provider]  vitamin B-12 (CYANOCOBALAMIN) 1000 MCG tablet Take 1 tablet (1,000 mcg total) by mouth daily. Patient not taking: Reported on 10/14/2018 05/27/18   Mendel Corning, MD    Allergies  Allergen Reactions  . 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 Patient subsequently taken off amiodarone.  . Nifedipine Other (See Comments)    Reaction: made gums swell up. Pt states that he had to have gum surgery after taking.     Family History  Problem Relation Age of Onset  . Diabetes Mellitus II Paternal Grandmother   . Heart disease Neg Hx        He does not know his father's history.   . Cancer Neg Hx   . Diabetes Neg Hx   . CAD Neg Hx   . Lung disease Neg Hx   . Rheumatologic disease Neg Hx     Social History Social History   Tobacco Use  . Smoking status: Never Smoker  . Smokeless tobacco: Former Systems developer    Types: Chew  Substance Use Topics  . Alcohol use: No  . Drug use: No    Comment: used chew when I was a teenager per patient     Review of Systems Constitutional: Negative for fever. Cardiovascular: Positive for central chest pain, started after the fall, worse with movement. Respiratory: Negative for shortness of breath.  Negative for cough. Gastrointestinal: Negative for abdominal pain, vomiting.  Positive for diarrhea x2 weeks. Genitourinary: Negative for urinary compaints Neurological: Negative for headache All other ROS negative  ____________________________________________   PHYSICAL EXAM:  VITAL SIGNS: ED Triage Vitals  Enc Vitals Group     BP 11/08/18 1030 (!) 150/56     Pulse Rate 11/08/18 1028 61     Resp 11/08/18 1028 20     Temp 11/08/18 1028 98.3 F (36.8 C)     Temp Source 11/08/18 1028 Oral     SpO2 11/08/18 1028 95 %     Weight 11/08/18 1029 196 lb 6.4 oz (89.1 kg)     Height  11/08/18 1029 5\' 8"  (1.727 m)     Head Circumference --      Peak  Flow --      Pain Score 11/08/18 1028 2     Pain Loc --      Pain Edu? --      Excl. in [place]? --    Constitutional: Alert and oriented. Well appearing and in no distress. Eyes: Normal exam ENT      Head: Normocephalic and atraumatic.      Mouth/Throat: Mucous membranes are moist. Cardiovascular: Normal rate, regular rhythm.  Respiratory: Normal respiratory effort without tachypnea nor retractions. Breath sounds are clear  Gastrointestinal: Soft and nontender. No distention. Musculoskeletal: Nontender with normal range of motion in all extremities.  Neurologic:  Normal speech and language. No gross focal neurologic deficits  Skin:  Skin is warm, dry and intact.  Psychiatric: Mood and affect are normal.  ____________________________________________    EKG  EKG viewed and interpreted by myself shows a normal sinus rhythm at 60 bpm with a narrow QRS, normal axis, slight PR prolongation otherwise normal intervals, nonspecific ST changes.  ____________________________________________    RADIOLOGY  CT scan of chest is negative for acute abnormality. CT scan of the head is negative.  ____________________________________________   INITIAL IMPRESSION / ASSESSMENT AND PLAN / ED COURSE  Pertinent labs & imaging results that were available during my care of the patient were reviewed by me and considered in my medical decision making (see chart for details).   Patient presents to the emergency department after a unwitnessed fall at his nursing facility last night was complaining of chest pain this morning.  Differential would include rib or sternum contusion or fracture, pneumonia, pneumothorax, respiratory infection.  We will check labs to help rule out electrolyte or metabolic abnormality, obtain a CT scan head and chest as a precaution and continue to closely monitor.  Patient agreeable to plan of care.  CT scans are  negative.  Lab work shows mild dehydration/renal insufficiency with mild elevation in potassium.  Patient has received IV fluids.  I discussed with the patient increasing oral fluids at home, he will need to have his labs rechecked later this week.  Patient agreeable to plan of care we will discharge the patient back to his nursing facility.  [place] was evaluated in Emergency Department on 11/08/2018 for the symptoms described in the history of present illness. He was evaluated in the context of the global COVID-19 pandemic, which necessitated consideration that the patient might be at risk for infection with the SARS-CoV-2 virus that causes COVID-19. Institutional protocols and algorithms that pertain to the evaluation of patients at risk for COVID-19 are in a state of rapid change based on information released by regulatory bodies including the CDC and federal and state organizations. These policies and algorithms were followed during the patient's care in the ED.  ____________________________________________   FINAL CLINICAL IMPRESSION(S) / ED DIAGNOSES  Cheral Marker, MD 11/08/18 1326

## 2018-11-08 NOTE — ED Notes (Signed)
Spoke with Erline Levine at [facility], informed her the patient would be returning this afternoon and that he needed to have labs rechecked later this week and would be on paper work what he needed.

## 2018-11-08 NOTE — Discharge Instructions (Addendum)
Please drink plenty of fluids, please follow-up with your doctor later this week to have your labs rechecked including your kidney function.  Return to the emergency department for any symptoms personally concerning to yourself.

## 2018-11-08 NOTE — ED Notes (Signed)
Pt assisted with urinal

## 2018-11-17 ENCOUNTER — Telehealth: Payer: Self-pay | Admitting: *Deleted

## 2018-11-17 ENCOUNTER — Telehealth: Payer: Self-pay | Admitting: Cardiology

## 2018-11-17 DIAGNOSIS — J181 Lobar pneumonia, unspecified organism: Secondary | ICD-10-CM | POA: Diagnosis not present

## 2018-11-17 DIAGNOSIS — I129 Hypertensive chronic kidney disease with stage 1 through stage 4 chronic kidney disease, or unspecified chronic kidney disease: Secondary | ICD-10-CM | POA: Diagnosis not present

## 2018-11-17 DIAGNOSIS — E1122 Type 2 diabetes mellitus with diabetic chronic kidney disease: Secondary | ICD-10-CM | POA: Diagnosis not present

## 2018-11-17 DIAGNOSIS — I4891 Unspecified atrial fibrillation: Secondary | ICD-10-CM | POA: Diagnosis not present

## 2018-11-17 NOTE — Telephone Encounter (Signed)
A message was left, re: follow up visit. 

## 2018-11-17 NOTE — Telephone Encounter (Signed)
New Message           Patient is now in a facility, will not be making an appointment. Just an [place]

## 2018-11-19 DIAGNOSIS — J8410 Pulmonary fibrosis, unspecified: Secondary | ICD-10-CM | POA: Diagnosis not present

## 2018-11-19 DIAGNOSIS — I5030 Unspecified diastolic (congestive) heart failure: Secondary | ICD-10-CM | POA: Diagnosis not present

## 2018-11-19 DIAGNOSIS — F329 Major depressive disorder, single episode, unspecified: Secondary | ICD-10-CM | POA: Diagnosis not present

## 2018-11-19 DIAGNOSIS — M109 Gout, unspecified: Secondary | ICD-10-CM | POA: Diagnosis not present

## 2018-11-21 ENCOUNTER — Other Ambulatory Visit: Payer: Self-pay

## 2018-11-21 ENCOUNTER — Emergency Department: Payer: Medicare Other

## 2018-11-21 ENCOUNTER — Emergency Department
Admission: EM | Admit: 2018-11-21 | Discharge: 2018-11-21 | Disposition: A | Discharge status: Skilled Nursing Facility | Payer: Medicare Other | Attending: Emergency Medicine | Admitting: Emergency Medicine

## 2018-11-21 DIAGNOSIS — I130 Hypertensive heart and chronic kidney disease with heart failure and stage 1 through stage 4 chronic kidney disease, or unspecified chronic kidney disease: Secondary | ICD-10-CM | POA: Insufficient documentation

## 2018-11-21 DIAGNOSIS — I499 Cardiac arrhythmia, unspecified: Secondary | ICD-10-CM | POA: Diagnosis not present

## 2018-11-21 DIAGNOSIS — Y9389 Activity, other specified: Secondary | ICD-10-CM | POA: Insufficient documentation

## 2018-11-21 DIAGNOSIS — W01198A Fall on same level from slipping, tripping and stumbling with subsequent striking against other object, initial encounter: Secondary | ICD-10-CM | POA: Diagnosis not present

## 2018-11-21 DIAGNOSIS — Z79899 Other long term (current) drug therapy: Secondary | ICD-10-CM | POA: Diagnosis not present

## 2018-11-21 DIAGNOSIS — S0990XA Unspecified injury of head, initial encounter: Secondary | ICD-10-CM | POA: Insufficient documentation

## 2018-11-21 DIAGNOSIS — Y92128 Other place in nursing home as the place of occurrence of the external cause: Secondary | ICD-10-CM | POA: Insufficient documentation

## 2018-11-21 DIAGNOSIS — I5032 Chronic diastolic (congestive) heart failure: Secondary | ICD-10-CM | POA: Diagnosis not present

## 2018-11-21 DIAGNOSIS — W19XXXA Unspecified fall, initial encounter: Secondary | ICD-10-CM | POA: Diagnosis not present

## 2018-11-21 DIAGNOSIS — Y999 Unspecified external cause status: Secondary | ICD-10-CM | POA: Insufficient documentation

## 2018-11-21 DIAGNOSIS — Z794 Long term (current) use of insulin: Secondary | ICD-10-CM | POA: Insufficient documentation

## 2018-11-21 DIAGNOSIS — N183 Chronic kidney disease, stage 3 (moderate): Secondary | ICD-10-CM | POA: Diagnosis not present

## 2018-11-21 DIAGNOSIS — R0689 Other abnormalities of breathing: Secondary | ICD-10-CM | POA: Diagnosis not present

## 2018-11-21 DIAGNOSIS — E1122 Type 2 diabetes mellitus with diabetic chronic kidney disease: Secondary | ICD-10-CM | POA: Diagnosis not present

## 2018-11-21 DIAGNOSIS — I10 Essential (primary) hypertension: Secondary | ICD-10-CM | POA: Diagnosis not present

## 2018-11-21 DIAGNOSIS — R279 Unspecified lack of coordination: Secondary | ICD-10-CM | POA: Diagnosis not present

## 2018-11-21 DIAGNOSIS — I440 Atrioventricular block, first degree: Secondary | ICD-10-CM | POA: Diagnosis not present

## 2018-11-21 DIAGNOSIS — Z743 Need for continuous supervision: Secondary | ICD-10-CM | POA: Diagnosis not present

## 2018-11-21 DIAGNOSIS — Z7982 Long term (current) use of aspirin: Secondary | ICD-10-CM | POA: Insufficient documentation

## 2018-11-21 LAB — GLUCOSE, CAPILLARY: Glucose-Capillary: 162 mg/dL — ABNORMAL HIGH (ref 70–99)

## 2018-11-21 MED ORDER — HYDRALAZINE HCL 50 MG PO TABS
25.0000 mg | ORAL_TABLET | Freq: Once | ORAL | Status: AC
  Administered 2018-11-21: 09:00:00 25 mg via ORAL
  Filled 2018-11-21: qty 1

## 2018-11-21 MED ORDER — ISOSORBIDE MONONITRATE ER 30 MG PO TB24
15.0000 mg | ORAL_TABLET | Freq: Every day | ORAL | Status: DC
  Administered 2018-11-21: 09:00:00 15 mg via ORAL
  Filled 2018-11-21: qty 1

## 2018-11-21 NOTE — ED Notes (Signed)
Assisted pt to use bathroom

## 2018-11-21 NOTE — ED Notes (Signed)
Wife and sister in law [phone] given update with pt verbal permission

## 2018-11-21 NOTE — ED Triage Notes (Signed)
Pt came from [place] after having a fall- he states he bent over to pick up some trash in the floor and lost his balance- he has a hx of vertigo- did hit his head on the floor- no LOC

## 2018-11-21 NOTE — ED Notes (Signed)
Attempted to call report x 2 with no answer. 

## 2018-11-21 NOTE — ED Notes (Signed)
Report given Marzetta Board

## 2018-11-21 NOTE — Discharge Instructions (Signed)
You were seen in the Emergency Department (ED) today for a head injury.  Based on your evaluation, you may have sustained a concussion (or bruise) to your brain.  If you had a CT scan done, it did not show any evidence of serious injury or bleeding.     Signs of a more serious head injury include vomiting, severe headache, excessive sleepiness or confusion, and weakness or numbness in your face, arms or legs.  Return immediately to the Emergency Department if you experience any of these more concerning symptoms.      

## 2018-11-21 NOTE — ED Provider Notes (Signed)
[facility] Emergency Department Provider Note   ____________________________________________   First MD Initiated Contact with Patient 11/21/18 [phone]     (approximate)  I have reviewed the triage vital signs and the nursing notes.   HISTORY  Chief Complaint Fall    HPI Justin Leach is a 85 y.o. male with a history of diabetes, chronic kidney disease, coronary disease  Patient presents today, reports that he was in his home he reached forward to pick trash up off the floor and he fell forward.  Reports he struck his head.  Mother time staff arrived he was awake, EMS reports he has been awake alert and appropriate throughout their evaluation with normal blood sugar and some hypertension  Patient reports he is not had his medications at this morning.  He denies being in any pain.  No arm or leg injury.  No neck pain.  No headache.  He hit his head on the floor.  He denies loss of consciousness, but reports he just felt like he was little unclear for a few seconds after striking.  No neck pain.  No other injury, except he reports he is sore over the left shoulder since he broke it in December but is not new.  Is been no chest pain or trouble breathing.  No fevers or chills.  No recent illness  Past Medical History:  Diagnosis Date  . Bradycardia    a. during 02/2015 admission - occasional HR in 40s while being treated for atrial fib.  . Cervical disc disease   . Chronic diastolic CHF (congestive heart failure) ([place])    a. Dx 02/2015 -  2D echo 03/02/15 showed severe focal basal hypertrophy of the septum, EF 55-60%, mild MR, no effusion.  . CKD (chronic kidney disease), stage III ([place])   . Coronary artery calcification seen on CT scan    a. Nuc 02/2015 was normal.  . Dyslipidemia   . GERD (gastroesophageal reflux disease)   . Hypertension   . 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 PAF (paroxysmal atrial  fibrillation) ([place])    a. Dx 02/2015 - in and out on tele, rx'd Coumadin and amiodarone.  . Pneumonia 2007  . Shortness of breath dyspnea    WITH EXERTION  . Type II diabetes mellitus [facility])     Patient Active Problem List   Diagnosis Date Noted  . Interstitial pulmonary disease ([place]) 07/29/2018  . Pneumonia 07/20/2018  . Palliative care encounter 06/26/2018  . Weakness generalized 06/26/2018  . HCAP (healthcare-associated pneumonia) 06/22/2018  . Gouty arthritis of right great toe 06/05/2018  . Hypertensive heart and kidney disease with chronic diastolic congestive heart failure and stage 3 chronic kidney disease ([place]) 06/01/2018  . Dyslipidemia associated with type 2 diabetes mellitus ([place]) 06/01/2018  . Chronic constipation 06/01/2018  . CKD stage 3 due to type 2 diabetes mellitus ([place]) 06/01/2018  . Volume overload 05/20/2018  . Fall 05/13/2018  . SDH (subdural hematoma) ([place]) 05/13/2018  . Closed comminuted left humeral fracture 05/13/2018  . Hyperkalemia 05/13/2018  . Leukocytosis 05/13/2018  . Fall at home   . Subdural hematoma without coma ([place])   . Cough   . GERD (gastroesophageal reflux disease)   . Pulmonary fibrosis ([place])   . Coronary artery calcification seen on CT scan   . Chronic diastolic CHF (congestive heart failure) ([place])   . PAF (paroxysmal atrial fibrillation) ([place])   . CKD (  chronic kidney disease), stage III ([place])   . Type II diabetes mellitus with renal manifestations ([place])   . Essential hypertension   . Chest pain 02/28/2015  . Renal failure (ARF), acute on chronic ([place]) 02/28/2015  . Hyperlipidemia 02/28/2015  . Dyspnea 03/29/2014  . DOE (dyspnea on exertion) 03/29/2014  . Bradycardia 03/29/2014  . Bronchospasm 05/15/2013    Past Surgical History:  Procedure Laterality Date  . CATARACT EXTRACTION W/ INTRAOCULAR LENS  IMPLANT, BILATERAL Bilateral   . CERVICAL DISC SURGERY     "i've had 3 neck ORs; not sure what kind; all thru the back of my  neck"  . CHOLECYSTECTOMY    . COLONOSCOPY WITH PROPOFOL N/A 01/02/2016   Procedure: COLONOSCOPY WITH PROPOFOL;  Surgeon: [name], MD;  Location: [facility] END[facility]COPY;  Service: Endoscopy;  Laterality: N/A;  . EYE SURGERY  1930's   "don't know what for" (05/11/2013)  . JOINT REPLACEMENT     bilateral knees, elbows, and shoulders (on 05/11/2013 pt denies all joint replacements"   . KNEE ARTHROPLASTY     "had one scope; one open knee OR; not sure which on which side" (05/10/2013)  . KNEE ARTHROSCOPY     "had one scope; one open knee OR; not sure which on which side" (05/10/2013)  . POSTERIOR FUSION CERVICAL SPINE    . REVERSE SHOULDER ARTHROPLASTY Left 05/19/2018   Procedure: REVERSE SHOULDER ARTHROPLASTY;  Surgeon: [name], MD;  Location: [place];  Service: Orthopedics;  Laterality: Left;  . ROTATOR CUFF REPAIR Bilateral   . VIDEO BRONCHOSCOPY Bilateral 02/01/2016   Procedure: VIDEO BRONCHOSCOPY WITHOUT FLUORO;  Surgeon: Marshell [name], MD;  Location: [facility];  Service: Cardiopulmonary;  Laterality: Bilateral;    Prior to Admission medications   Medication Sig Start Date End Date Taking? Authorizing Provider  acetaminophen (TYLENOL) 325 MG tablet Take 650 mg by mouth every 6 (six) hours as needed for mild pain or moderate pain.  06/11/18  Yes [provider]  aspirin 81 MG chewable tablet Chew 1 tablet (81 mg total) by mouth daily. 07/23/18  Yes Wieting, Richard, MD  atorvastatin (LIPITOR) 40 MG tablet Take 40 mg by mouth at bedtime.  05/29/18  Yes [provider]  bisacodyl (DULCOLAX) 10 MG suppository Place 1 suppository (10 mg total) rectally daily as needed for moderate constipation. 05/27/18  Yes Rai, Ripudeep K, MD  carbamide peroxide (DEBROX) 6.5 % OTIC solution Place 5 drops into both ears 2 (two) times daily. Patient taking differently: Place 5 drops into both ears 2 (two) times daily as needed.  07/23/18  Yes Wieting, Richard, MD  citalopram (CELEXA) 20  MG tablet Take 20 mg by mouth daily.   Yes [provider]  febuxostat (ULORIC) 40 MG tablet Take 40 mg by mouth daily. 06/02/18  Yes [provider]  furosemide (LASIX) 40 MG tablet Take 40 mg by mouth daily. 10/16/18  Yes [provider]  hydrALAZINE (APRESOLINE) 25 MG tablet Take 1 tablet (25 mg total) by mouth every 8 (eight) hours. 05/27/18  Yes Rai, Ripudeep K, MD  insulin lispro (ADMELOG) 100 UNIT/ML injection Inject 8 Units into the skin 3 (three) times daily with meals. Sliding Scale:  000-199: 0u 200-249: 2u 250-299: 4u 300-349: 6u 350-399: 8u 400-449: 10u 450-499: 12u 500+ : contact physician   Yes [provider]  isosorbide mononitrate (IMDUR) 60 MG 24 hr tablet Take 1 tablet (60 mg total) by mouth daily. 07/23/18  Yes Loletha Grayer, MD  LANTUS  SOLOSTAR 100 UNIT/ML Solostar Pen Inject 23 Units into the skin at bedtime.  10/26/18  Yes [provider]  loperamide (IMODIUM) 2 MG capsule Take 2 mg by mouth every 8 (eight) hours as needed for diarrhea or loose stools.   Yes [provider]  Metoprolol Tartrate 75 MG TABS Take 75 mg by mouth 2 (two) times daily.    Yes [provider]  Multiple Vitamin (MULTIVITAMIN WITH MINERALS) TABS tablet Take 1 tablet by mouth daily.   Yes [provider]  nitroGLYCERIN (NITROSTAT) 0.4 MG SL tablet Place 0.4 mg under the tongue every 5 (five) minutes as needed for chest pain (max 3 doses).  07/20/18  Yes [provider]  omeprazole (PRILOSEC) 20 MG capsule Take 20 mg by mouth daily.  02/26/13  Yes [provider]  ondansetron (ZOFRAN) 4 MG tablet Take 4 mg by mouth every 4 (four) hours as needed for nausea or vomiting.   Yes [provider]  polyethylene glycol (MIRALAX / GLYCOLAX) packet Take 17 g by mouth daily as needed for mild constipation. 05/27/18  Yes Rai, Ripudeep K, MD    Allergies Amiodarone and Nifedipine  Family History  Problem Relation Age  of Onset  . Diabetes Mellitus II Paternal Grandmother   . Heart disease Neg Hx        He does not know his father's history.   . Cancer Neg Hx   . Diabetes Neg Hx   . CAD Neg Hx   . Lung disease Neg Hx   . Rheumatologic disease Neg Hx     Social History Social History   Tobacco Use  . Smoking status: Never Smoker  . Smokeless tobacco: Former Systems developer    Types: Chew  Substance Use Topics  . Alcohol use: No  . Drug use: No    Comment: used chew when I was a teenager per patient     Review of Systems Constitutional: No fever/chills Eyes: No visual changes. ENT: No sore throat.  No neck pain. Cardiovascular: Denies chest pain. Respiratory: Denies shortness of breath. Gastrointestinal: No abdominal pain.   Genitourinary: Negative for dysuria. Musculoskeletal: Negative for back pain. Skin: Negative for rash. Neurological: Negative for headaches, areas of focal weakness or numbness.    ____________________________________________   PHYSICAL EXAM:  VITAL SIGNS: ED Triage Vitals  Enc Vitals Group     BP 11/21/18 0817 (!) 207/65     Pulse Rate 11/21/18 0817 (!) 52     Resp 11/21/18 0817 16     Temp 11/21/18 0817 98.3 F (36.8 C)     Temp Source 11/21/18 0817 Oral     SpO2 11/21/18 0817 94 %     Weight 11/21/18 0821 185 lb (83.9 kg)     Height 11/21/18 0821 5\' 11"  (1.803 m)     Head Circumference --      Peak Flow --      Pain Score 11/21/18 0818 0     Pain Loc --      Pain Edu? --      Excl. in [place]? --     Constitutional: Alert and oriented. Well appearing and in no acute distress. Eyes: Conjunctivae are normal. Head: Atraumatic. Nose: No congestion/rhinnorhea. Mouth/Throat: Mucous membranes are moist. Neck: No stridor.  Cardiovascular: Normal rate, regular rhythm. Grossly normal heart sounds.  Good peripheral circulation. Respiratory: Normal respiratory effort.  No retractions. Lungs CTAB. Gastrointestinal: Soft and nontender. No distention. Musculoskeletal:    RIGHT Right upper extremity  demonstrates normal strength, good use of all muscles. No edema bruising or contusions of the right shoulder/upper arm, right elbow, right forearm / hand. Full range of motion of the right right upper extremity without pain. No evidence of trauma. Strong radial pulse. Intact median/ulnar/radial neuro-muscular exam.  LEFT Left upper extremity demonstrates normal strength, good use of all muscles but limitation in movement with difficulty abducting which she reports is ongoing since previous fracture and not a new concern. No edema bruising or contusions of the left shoulder/upper arm, left elbow, left forearm / hand.  Old scar left anterior shoulder. No evidence of trauma. Strong radial pulse. Intact median/ulnar/radial neuro-muscular exam.  Lower Extremities  No edema. Normal DP/PT pulses bilateral with good cap refill.  Normal neuro-motor function lower extremities bilateral.  RIGHT Right lower extremity demonstrates normal strength, good use of all muscles. No edema bruising or contusions of the right hip, right knee, right ankle. Full range of motion of the right lower extremity without pain. No pain on axial loading. No evidence of trauma.  LEFT Left lower extremity demonstrates normal strength, good use of all muscles. No edema bruising or contusions of the hip,  knee, ankle. Full range of motion of the left lower extremity without pain. No pain on axial loading. No evidence of trauma.   Neurologic:  Normal speech and language. No gross focal neurologic deficits are appreciated.  Skin:  Skin is warm, dry and intact. No rash noted. Psychiatric: Mood and affect are normal. Speech and behavior are normal.  ____________________________________________   LABS (all labs ordered are listed, but only abnormal results are displayed)  Labs Reviewed  GLUCOSE, CAPILLARY - Abnormal; Notable for the following components:      Result Value   Glucose-Capillary 162  (*)    All other components within normal limits  CBG MONITORING, ED   ____________________________________________  EKG  Reviewed interpreted at 830 Heart rate 50 QRS 99 QTc 440 Normal sinus rhythm, no evidence of ischemia.  Artifact ____________________________________________  RADIOLOGY  CT head negative for acute abnormality ____________________________________________   PROCEDURES  Procedure(s) performed: None  Procedures  Critical Care performed: No  ____________________________________________   INITIAL IMPRESSION / ASSESSMENT AND PLAN / ED COURSE  Pertinent labs & imaging results that were available during my care of the patient were reviewed by me and considered in my medical decision making (see chart for details).   Patient reports mechanical fall.  Reports he was stunned or slightly off perhaps after hitting his head.  Will obtain a CT of the head to rule out intracranial hemorrhage given the patient's age and use of aspirin.  He overall appears well neurologically intact.  No evidence of other injury.  No evidence of neck injury.  Full range of motionof neck without pain or discomfort neurologically intact  No alcohol or drug use  Hypertension noted, has not yet had his medications today.   ----------------------------------------- 9:21 AM on 11/21/2018 -----------------------------------------  Patient did not take his home blood pressure medicine, administered hydralazine and Imdur just recently.  He is alert, oriented, appears appropriate for return to assisted living center.  Patient comfortable with plan.  Patient would like to be able to go home and denies complaints or concerns.  He appears well.  Repeat blood pressure improving  Return precautions and treatment recommendations and follow-up discussed with the patient who is agreeable with the plan.       ____________________________________________   FINAL CLINICAL IMPRESSION(S) / ED  DIAGNOSES  Final  diagnoses:  Fall, initial encounter  Injury of head, initial encounter        Note:  This document was prepared using Systems analyst and may include unintentional dictation errors       Delman Kitten, MD 11/21/18 1006

## 2018-11-21 NOTE — ED Notes (Signed)
Patient transported to CT 

## 2018-11-24 DIAGNOSIS — M5090 Cervical disc disorder, unspecified, unspecified cervical region: Secondary | ICD-10-CM | POA: Diagnosis not present

## 2018-11-24 DIAGNOSIS — R2681 Unsteadiness on feet: Secondary | ICD-10-CM | POA: Diagnosis not present

## 2018-11-24 DIAGNOSIS — R350 Frequency of micturition: Secondary | ICD-10-CM | POA: Diagnosis not present

## 2018-11-24 DIAGNOSIS — F0390 Unspecified dementia without behavioral disturbance: Secondary | ICD-10-CM | POA: Diagnosis not present

## 2018-11-26 DIAGNOSIS — R41841 Cognitive communication deficit: Secondary | ICD-10-CM | POA: Diagnosis not present

## 2018-11-26 DIAGNOSIS — R2681 Unsteadiness on feet: Secondary | ICD-10-CM | POA: Diagnosis not present

## 2018-11-26 DIAGNOSIS — M5090 Cervical disc disorder, unspecified, unspecified cervical region: Secondary | ICD-10-CM | POA: Diagnosis not present

## 2018-11-26 DIAGNOSIS — R350 Frequency of micturition: Secondary | ICD-10-CM | POA: Diagnosis not present

## 2018-12-01 DIAGNOSIS — E1122 Type 2 diabetes mellitus with diabetic chronic kidney disease: Secondary | ICD-10-CM | POA: Diagnosis not present

## 2018-12-01 DIAGNOSIS — I129 Hypertensive chronic kidney disease with stage 1 through stage 4 chronic kidney disease, or unspecified chronic kidney disease: Secondary | ICD-10-CM | POA: Diagnosis not present

## 2018-12-01 DIAGNOSIS — I509 Heart failure, unspecified: Secondary | ICD-10-CM | POA: Diagnosis not present

## 2018-12-01 DIAGNOSIS — N183 Chronic kidney disease, stage 3 (moderate): Secondary | ICD-10-CM | POA: Diagnosis not present

## 2018-12-07 DIAGNOSIS — F0390 Unspecified dementia without behavioral disturbance: Secondary | ICD-10-CM | POA: Diagnosis not present

## 2018-12-07 DIAGNOSIS — I2510 Atherosclerotic heart disease of native coronary artery without angina pectoris: Secondary | ICD-10-CM | POA: Diagnosis not present

## 2018-12-07 DIAGNOSIS — R531 Weakness: Secondary | ICD-10-CM | POA: Diagnosis not present

## 2018-12-07 DIAGNOSIS — I480 Paroxysmal atrial fibrillation: Secondary | ICD-10-CM | POA: Diagnosis not present

## 2018-12-07 DIAGNOSIS — Z9181 History of falling: Secondary | ICD-10-CM | POA: Diagnosis not present

## 2018-12-07 DIAGNOSIS — J8410 Pulmonary fibrosis, unspecified: Secondary | ICD-10-CM | POA: Diagnosis not present

## 2018-12-07 DIAGNOSIS — R350 Frequency of micturition: Secondary | ICD-10-CM | POA: Diagnosis not present

## 2018-12-07 DIAGNOSIS — E1122 Type 2 diabetes mellitus with diabetic chronic kidney disease: Secondary | ICD-10-CM | POA: Diagnosis not present

## 2018-12-07 DIAGNOSIS — Z981 Arthrodesis status: Secondary | ICD-10-CM | POA: Diagnosis not present

## 2018-12-07 DIAGNOSIS — M1A3710 Chronic gout due to renal impairment, right ankle and foot, without tophus (tophi): Secondary | ICD-10-CM | POA: Diagnosis not present

## 2018-12-07 DIAGNOSIS — I5032 Chronic diastolic (congestive) heart failure: Secondary | ICD-10-CM | POA: Diagnosis not present

## 2018-12-07 DIAGNOSIS — M5090 Cervical disc disorder, unspecified, unspecified cervical region: Secondary | ICD-10-CM | POA: Diagnosis not present

## 2018-12-07 DIAGNOSIS — Z8673 Personal history of transient ischemic attack (TIA), and cerebral infarction without residual deficits: Secondary | ICD-10-CM | POA: Diagnosis not present

## 2018-12-07 DIAGNOSIS — Z96653 Presence of artificial knee joint, bilateral: Secondary | ICD-10-CM | POA: Diagnosis not present

## 2018-12-07 DIAGNOSIS — I130 Hypertensive heart and chronic kidney disease with heart failure and stage 1 through stage 4 chronic kidney disease, or unspecified chronic kidney disease: Secondary | ICD-10-CM | POA: Diagnosis not present

## 2018-12-07 DIAGNOSIS — N183 Chronic kidney disease, stage 3 (moderate): Secondary | ICD-10-CM | POA: Diagnosis not present

## 2018-12-08 DIAGNOSIS — F329 Major depressive disorder, single episode, unspecified: Secondary | ICD-10-CM | POA: Diagnosis not present

## 2018-12-08 DIAGNOSIS — K219 Gastro-esophageal reflux disease without esophagitis: Secondary | ICD-10-CM | POA: Diagnosis not present

## 2018-12-08 DIAGNOSIS — M109 Gout, unspecified: Secondary | ICD-10-CM | POA: Diagnosis not present

## 2018-12-08 DIAGNOSIS — E7849 Other hyperlipidemia: Secondary | ICD-10-CM | POA: Diagnosis not present

## 2018-12-09 DIAGNOSIS — K219 Gastro-esophageal reflux disease without esophagitis: Secondary | ICD-10-CM | POA: Diagnosis not present

## 2018-12-09 DIAGNOSIS — M109 Gout, unspecified: Secondary | ICD-10-CM | POA: Diagnosis not present

## 2018-12-09 DIAGNOSIS — E7849 Other hyperlipidemia: Secondary | ICD-10-CM | POA: Diagnosis not present

## 2018-12-09 DIAGNOSIS — F329 Major depressive disorder, single episode, unspecified: Secondary | ICD-10-CM | POA: Diagnosis not present

## 2018-12-11 DIAGNOSIS — J8410 Pulmonary fibrosis, unspecified: Secondary | ICD-10-CM | POA: Diagnosis not present

## 2018-12-11 DIAGNOSIS — F0390 Unspecified dementia without behavioral disturbance: Secondary | ICD-10-CM | POA: Diagnosis not present

## 2018-12-11 DIAGNOSIS — Z96653 Presence of artificial knee joint, bilateral: Secondary | ICD-10-CM | POA: Diagnosis not present

## 2018-12-11 DIAGNOSIS — M1A3710 Chronic gout due to renal impairment, right ankle and foot, without tophus (tophi): Secondary | ICD-10-CM | POA: Diagnosis not present

## 2018-12-11 DIAGNOSIS — Z9181 History of falling: Secondary | ICD-10-CM | POA: Diagnosis not present

## 2018-12-11 DIAGNOSIS — I5032 Chronic diastolic (congestive) heart failure: Secondary | ICD-10-CM | POA: Diagnosis not present

## 2018-12-11 DIAGNOSIS — M5090 Cervical disc disorder, unspecified, unspecified cervical region: Secondary | ICD-10-CM | POA: Diagnosis not present

## 2018-12-11 DIAGNOSIS — R531 Weakness: Secondary | ICD-10-CM | POA: Diagnosis not present

## 2018-12-11 DIAGNOSIS — I2510 Atherosclerotic heart disease of native coronary artery without angina pectoris: Secondary | ICD-10-CM | POA: Diagnosis not present

## 2018-12-11 DIAGNOSIS — Z981 Arthrodesis status: Secondary | ICD-10-CM | POA: Diagnosis not present

## 2018-12-11 DIAGNOSIS — N183 Chronic kidney disease, stage 3 (moderate): Secondary | ICD-10-CM | POA: Diagnosis not present

## 2018-12-11 DIAGNOSIS — Z8673 Personal history of transient ischemic attack (TIA), and cerebral infarction without residual deficits: Secondary | ICD-10-CM | POA: Diagnosis not present

## 2018-12-11 DIAGNOSIS — E1122 Type 2 diabetes mellitus with diabetic chronic kidney disease: Secondary | ICD-10-CM | POA: Diagnosis not present

## 2018-12-11 DIAGNOSIS — R350 Frequency of micturition: Secondary | ICD-10-CM | POA: Diagnosis not present

## 2018-12-11 DIAGNOSIS — I130 Hypertensive heart and chronic kidney disease with heart failure and stage 1 through stage 4 chronic kidney disease, or unspecified chronic kidney disease: Secondary | ICD-10-CM | POA: Diagnosis not present

## 2018-12-11 DIAGNOSIS — I480 Paroxysmal atrial fibrillation: Secondary | ICD-10-CM | POA: Diagnosis not present

## 2018-12-14 DIAGNOSIS — R531 Weakness: Secondary | ICD-10-CM | POA: Diagnosis not present

## 2018-12-14 DIAGNOSIS — Z8673 Personal history of transient ischemic attack (TIA), and cerebral infarction without residual deficits: Secondary | ICD-10-CM | POA: Diagnosis not present

## 2018-12-14 DIAGNOSIS — Z96653 Presence of artificial knee joint, bilateral: Secondary | ICD-10-CM | POA: Diagnosis not present

## 2018-12-14 DIAGNOSIS — Z9181 History of falling: Secondary | ICD-10-CM | POA: Diagnosis not present

## 2018-12-14 DIAGNOSIS — I2510 Atherosclerotic heart disease of native coronary artery without angina pectoris: Secondary | ICD-10-CM | POA: Diagnosis not present

## 2018-12-14 DIAGNOSIS — Z981 Arthrodesis status: Secondary | ICD-10-CM | POA: Diagnosis not present

## 2018-12-14 DIAGNOSIS — I5032 Chronic diastolic (congestive) heart failure: Secondary | ICD-10-CM | POA: Diagnosis not present

## 2018-12-14 DIAGNOSIS — R350 Frequency of micturition: Secondary | ICD-10-CM | POA: Diagnosis not present

## 2018-12-14 DIAGNOSIS — F0390 Unspecified dementia without behavioral disturbance: Secondary | ICD-10-CM | POA: Diagnosis not present

## 2018-12-14 DIAGNOSIS — I480 Paroxysmal atrial fibrillation: Secondary | ICD-10-CM | POA: Diagnosis not present

## 2018-12-14 DIAGNOSIS — E1122 Type 2 diabetes mellitus with diabetic chronic kidney disease: Secondary | ICD-10-CM | POA: Diagnosis not present

## 2018-12-14 DIAGNOSIS — N183 Chronic kidney disease, stage 3 (moderate): Secondary | ICD-10-CM | POA: Diagnosis not present

## 2018-12-14 DIAGNOSIS — M1A3710 Chronic gout due to renal impairment, right ankle and foot, without tophus (tophi): Secondary | ICD-10-CM | POA: Diagnosis not present

## 2018-12-14 DIAGNOSIS — M5090 Cervical disc disorder, unspecified, unspecified cervical region: Secondary | ICD-10-CM | POA: Diagnosis not present

## 2018-12-14 DIAGNOSIS — J8410 Pulmonary fibrosis, unspecified: Secondary | ICD-10-CM | POA: Diagnosis not present

## 2018-12-14 DIAGNOSIS — I130 Hypertensive heart and chronic kidney disease with heart failure and stage 1 through stage 4 chronic kidney disease, or unspecified chronic kidney disease: Secondary | ICD-10-CM | POA: Diagnosis not present

## 2018-12-17 DIAGNOSIS — J8410 Pulmonary fibrosis, unspecified: Secondary | ICD-10-CM | POA: Diagnosis not present

## 2018-12-17 DIAGNOSIS — Z981 Arthrodesis status: Secondary | ICD-10-CM | POA: Diagnosis not present

## 2018-12-17 DIAGNOSIS — Z8673 Personal history of transient ischemic attack (TIA), and cerebral infarction without residual deficits: Secondary | ICD-10-CM | POA: Diagnosis not present

## 2018-12-17 DIAGNOSIS — I130 Hypertensive heart and chronic kidney disease with heart failure and stage 1 through stage 4 chronic kidney disease, or unspecified chronic kidney disease: Secondary | ICD-10-CM | POA: Diagnosis not present

## 2018-12-17 DIAGNOSIS — Z9181 History of falling: Secondary | ICD-10-CM | POA: Diagnosis not present

## 2018-12-17 DIAGNOSIS — R531 Weakness: Secondary | ICD-10-CM | POA: Diagnosis not present

## 2018-12-17 DIAGNOSIS — N183 Chronic kidney disease, stage 3 (moderate): Secondary | ICD-10-CM | POA: Diagnosis not present

## 2018-12-17 DIAGNOSIS — E1122 Type 2 diabetes mellitus with diabetic chronic kidney disease: Secondary | ICD-10-CM | POA: Diagnosis not present

## 2018-12-17 DIAGNOSIS — Z96653 Presence of artificial knee joint, bilateral: Secondary | ICD-10-CM | POA: Diagnosis not present

## 2018-12-17 DIAGNOSIS — M1A3710 Chronic gout due to renal impairment, right ankle and foot, without tophus (tophi): Secondary | ICD-10-CM | POA: Diagnosis not present

## 2018-12-17 DIAGNOSIS — I2510 Atherosclerotic heart disease of native coronary artery without angina pectoris: Secondary | ICD-10-CM | POA: Diagnosis not present

## 2018-12-17 DIAGNOSIS — F0390 Unspecified dementia without behavioral disturbance: Secondary | ICD-10-CM | POA: Diagnosis not present

## 2018-12-17 DIAGNOSIS — I5032 Chronic diastolic (congestive) heart failure: Secondary | ICD-10-CM | POA: Diagnosis not present

## 2018-12-17 DIAGNOSIS — I480 Paroxysmal atrial fibrillation: Secondary | ICD-10-CM | POA: Diagnosis not present

## 2018-12-17 DIAGNOSIS — M5090 Cervical disc disorder, unspecified, unspecified cervical region: Secondary | ICD-10-CM | POA: Diagnosis not present

## 2018-12-17 DIAGNOSIS — R350 Frequency of micturition: Secondary | ICD-10-CM | POA: Diagnosis not present

## 2018-12-21 DIAGNOSIS — M5090 Cervical disc disorder, unspecified, unspecified cervical region: Secondary | ICD-10-CM | POA: Diagnosis not present

## 2018-12-21 DIAGNOSIS — Z96653 Presence of artificial knee joint, bilateral: Secondary | ICD-10-CM | POA: Diagnosis not present

## 2018-12-21 DIAGNOSIS — I130 Hypertensive heart and chronic kidney disease with heart failure and stage 1 through stage 4 chronic kidney disease, or unspecified chronic kidney disease: Secondary | ICD-10-CM | POA: Diagnosis not present

## 2018-12-21 DIAGNOSIS — I480 Paroxysmal atrial fibrillation: Secondary | ICD-10-CM | POA: Diagnosis not present

## 2018-12-21 DIAGNOSIS — R531 Weakness: Secondary | ICD-10-CM | POA: Diagnosis not present

## 2018-12-21 DIAGNOSIS — Z8673 Personal history of transient ischemic attack (TIA), and cerebral infarction without residual deficits: Secondary | ICD-10-CM | POA: Diagnosis not present

## 2018-12-21 DIAGNOSIS — Z9181 History of falling: Secondary | ICD-10-CM | POA: Diagnosis not present

## 2018-12-21 DIAGNOSIS — I2510 Atherosclerotic heart disease of native coronary artery without angina pectoris: Secondary | ICD-10-CM | POA: Diagnosis not present

## 2018-12-21 DIAGNOSIS — I5032 Chronic diastolic (congestive) heart failure: Secondary | ICD-10-CM | POA: Diagnosis not present

## 2018-12-21 DIAGNOSIS — M1A3710 Chronic gout due to renal impairment, right ankle and foot, without tophus (tophi): Secondary | ICD-10-CM | POA: Diagnosis not present

## 2018-12-21 DIAGNOSIS — J8410 Pulmonary fibrosis, unspecified: Secondary | ICD-10-CM | POA: Diagnosis not present

## 2018-12-21 DIAGNOSIS — E1122 Type 2 diabetes mellitus with diabetic chronic kidney disease: Secondary | ICD-10-CM | POA: Diagnosis not present

## 2018-12-21 DIAGNOSIS — N183 Chronic kidney disease, stage 3 (moderate): Secondary | ICD-10-CM | POA: Diagnosis not present

## 2018-12-21 DIAGNOSIS — F0390 Unspecified dementia without behavioral disturbance: Secondary | ICD-10-CM | POA: Diagnosis not present

## 2018-12-21 DIAGNOSIS — R350 Frequency of micturition: Secondary | ICD-10-CM | POA: Diagnosis not present

## 2018-12-21 DIAGNOSIS — Z981 Arthrodesis status: Secondary | ICD-10-CM | POA: Diagnosis not present

## 2018-12-25 DIAGNOSIS — I480 Paroxysmal atrial fibrillation: Secondary | ICD-10-CM | POA: Diagnosis not present

## 2018-12-25 DIAGNOSIS — Z9181 History of falling: Secondary | ICD-10-CM | POA: Diagnosis not present

## 2018-12-25 DIAGNOSIS — I5032 Chronic diastolic (congestive) heart failure: Secondary | ICD-10-CM | POA: Diagnosis not present

## 2018-12-25 DIAGNOSIS — Z8673 Personal history of transient ischemic attack (TIA), and cerebral infarction without residual deficits: Secondary | ICD-10-CM | POA: Diagnosis not present

## 2018-12-25 DIAGNOSIS — Z981 Arthrodesis status: Secondary | ICD-10-CM | POA: Diagnosis not present

## 2018-12-25 DIAGNOSIS — R531 Weakness: Secondary | ICD-10-CM | POA: Diagnosis not present

## 2018-12-25 DIAGNOSIS — R350 Frequency of micturition: Secondary | ICD-10-CM | POA: Diagnosis not present

## 2018-12-25 DIAGNOSIS — J8410 Pulmonary fibrosis, unspecified: Secondary | ICD-10-CM | POA: Diagnosis not present

## 2018-12-25 DIAGNOSIS — I130 Hypertensive heart and chronic kidney disease with heart failure and stage 1 through stage 4 chronic kidney disease, or unspecified chronic kidney disease: Secondary | ICD-10-CM | POA: Diagnosis not present

## 2018-12-25 DIAGNOSIS — M1A3710 Chronic gout due to renal impairment, right ankle and foot, without tophus (tophi): Secondary | ICD-10-CM | POA: Diagnosis not present

## 2018-12-25 DIAGNOSIS — M5090 Cervical disc disorder, unspecified, unspecified cervical region: Secondary | ICD-10-CM | POA: Diagnosis not present

## 2018-12-25 DIAGNOSIS — F0390 Unspecified dementia without behavioral disturbance: Secondary | ICD-10-CM | POA: Diagnosis not present

## 2018-12-25 DIAGNOSIS — N183 Chronic kidney disease, stage 3 (moderate): Secondary | ICD-10-CM | POA: Diagnosis not present

## 2018-12-25 DIAGNOSIS — Z96653 Presence of artificial knee joint, bilateral: Secondary | ICD-10-CM | POA: Diagnosis not present

## 2018-12-25 DIAGNOSIS — E1122 Type 2 diabetes mellitus with diabetic chronic kidney disease: Secondary | ICD-10-CM | POA: Diagnosis not present

## 2018-12-25 DIAGNOSIS — I2510 Atherosclerotic heart disease of native coronary artery without angina pectoris: Secondary | ICD-10-CM | POA: Diagnosis not present

## 2018-12-28 ENCOUNTER — Telehealth: Payer: Self-pay | Admitting: Student

## 2018-12-28 NOTE — Telephone Encounter (Signed)
Palliative NP received return call from nurse Lattie Haw. We discussed patient. She states that he has been doing fair overrall. His PCP has adjusted his diabetic medications. She also reports patient followed by psych and his citalopram was increased to 40mg  0 12/09/2018.

## 2018-12-28 NOTE — Telephone Encounter (Signed)
Palliative NP left message for facility nurse Varney Biles to follow up on patient; awaiting return call.

## 2018-12-29 DIAGNOSIS — J8410 Pulmonary fibrosis, unspecified: Secondary | ICD-10-CM | POA: Diagnosis not present

## 2018-12-29 DIAGNOSIS — N183 Chronic kidney disease, stage 3 (moderate): Secondary | ICD-10-CM | POA: Diagnosis not present

## 2018-12-29 DIAGNOSIS — I480 Paroxysmal atrial fibrillation: Secondary | ICD-10-CM | POA: Diagnosis not present

## 2018-12-29 DIAGNOSIS — R350 Frequency of micturition: Secondary | ICD-10-CM | POA: Diagnosis not present

## 2018-12-29 DIAGNOSIS — M1A3710 Chronic gout due to renal impairment, right ankle and foot, without tophus (tophi): Secondary | ICD-10-CM | POA: Diagnosis not present

## 2018-12-29 DIAGNOSIS — Z96653 Presence of artificial knee joint, bilateral: Secondary | ICD-10-CM | POA: Diagnosis not present

## 2018-12-29 DIAGNOSIS — R531 Weakness: Secondary | ICD-10-CM | POA: Diagnosis not present

## 2018-12-29 DIAGNOSIS — Z9181 History of falling: Secondary | ICD-10-CM | POA: Diagnosis not present

## 2018-12-29 DIAGNOSIS — Z8673 Personal history of transient ischemic attack (TIA), and cerebral infarction without residual deficits: Secondary | ICD-10-CM | POA: Diagnosis not present

## 2018-12-29 DIAGNOSIS — E1122 Type 2 diabetes mellitus with diabetic chronic kidney disease: Secondary | ICD-10-CM | POA: Diagnosis not present

## 2018-12-29 DIAGNOSIS — M5090 Cervical disc disorder, unspecified, unspecified cervical region: Secondary | ICD-10-CM | POA: Diagnosis not present

## 2018-12-29 DIAGNOSIS — F0390 Unspecified dementia without behavioral disturbance: Secondary | ICD-10-CM | POA: Diagnosis not present

## 2018-12-29 DIAGNOSIS — I5032 Chronic diastolic (congestive) heart failure: Secondary | ICD-10-CM | POA: Diagnosis not present

## 2018-12-29 DIAGNOSIS — I2510 Atherosclerotic heart disease of native coronary artery without angina pectoris: Secondary | ICD-10-CM | POA: Diagnosis not present

## 2018-12-29 DIAGNOSIS — Z981 Arthrodesis status: Secondary | ICD-10-CM | POA: Diagnosis not present

## 2018-12-29 DIAGNOSIS — I130 Hypertensive heart and chronic kidney disease with heart failure and stage 1 through stage 4 chronic kidney disease, or unspecified chronic kidney disease: Secondary | ICD-10-CM | POA: Diagnosis not present

## 2018-12-31 DIAGNOSIS — E1122 Type 2 diabetes mellitus with diabetic chronic kidney disease: Secondary | ICD-10-CM | POA: Diagnosis not present

## 2018-12-31 DIAGNOSIS — N183 Chronic kidney disease, stage 3 (moderate): Secondary | ICD-10-CM | POA: Diagnosis not present

## 2018-12-31 DIAGNOSIS — I4891 Unspecified atrial fibrillation: Secondary | ICD-10-CM | POA: Diagnosis not present

## 2018-12-31 DIAGNOSIS — Z96653 Presence of artificial knee joint, bilateral: Secondary | ICD-10-CM | POA: Diagnosis not present

## 2018-12-31 DIAGNOSIS — Z9181 History of falling: Secondary | ICD-10-CM | POA: Diagnosis not present

## 2018-12-31 DIAGNOSIS — M5090 Cervical disc disorder, unspecified, unspecified cervical region: Secondary | ICD-10-CM | POA: Diagnosis not present

## 2018-12-31 DIAGNOSIS — I130 Hypertensive heart and chronic kidney disease with heart failure and stage 1 through stage 4 chronic kidney disease, or unspecified chronic kidney disease: Secondary | ICD-10-CM | POA: Diagnosis not present

## 2018-12-31 DIAGNOSIS — R350 Frequency of micturition: Secondary | ICD-10-CM | POA: Diagnosis not present

## 2018-12-31 DIAGNOSIS — R0781 Pleurodynia: Secondary | ICD-10-CM | POA: Diagnosis not present

## 2018-12-31 DIAGNOSIS — M1A3710 Chronic gout due to renal impairment, right ankle and foot, without tophus (tophi): Secondary | ICD-10-CM | POA: Diagnosis not present

## 2018-12-31 DIAGNOSIS — F0390 Unspecified dementia without behavioral disturbance: Secondary | ICD-10-CM | POA: Diagnosis not present

## 2018-12-31 DIAGNOSIS — I480 Paroxysmal atrial fibrillation: Secondary | ICD-10-CM | POA: Diagnosis not present

## 2018-12-31 DIAGNOSIS — R05 Cough: Secondary | ICD-10-CM | POA: Diagnosis not present

## 2018-12-31 DIAGNOSIS — R531 Weakness: Secondary | ICD-10-CM | POA: Diagnosis not present

## 2018-12-31 DIAGNOSIS — I5032 Chronic diastolic (congestive) heart failure: Secondary | ICD-10-CM | POA: Diagnosis not present

## 2018-12-31 DIAGNOSIS — I2510 Atherosclerotic heart disease of native coronary artery without angina pectoris: Secondary | ICD-10-CM | POA: Diagnosis not present

## 2018-12-31 DIAGNOSIS — J8410 Pulmonary fibrosis, unspecified: Secondary | ICD-10-CM | POA: Diagnosis not present

## 2018-12-31 DIAGNOSIS — Z981 Arthrodesis status: Secondary | ICD-10-CM | POA: Diagnosis not present

## 2018-12-31 DIAGNOSIS — Z8673 Personal history of transient ischemic attack (TIA), and cerebral infarction without residual deficits: Secondary | ICD-10-CM | POA: Diagnosis not present

## 2018-12-31 DIAGNOSIS — R634 Abnormal weight loss: Secondary | ICD-10-CM | POA: Diagnosis not present

## 2019-01-05 DIAGNOSIS — N183 Chronic kidney disease, stage 3 (moderate): Secondary | ICD-10-CM | POA: Diagnosis not present

## 2019-01-05 DIAGNOSIS — I129 Hypertensive chronic kidney disease with stage 1 through stage 4 chronic kidney disease, or unspecified chronic kidney disease: Secondary | ICD-10-CM | POA: Diagnosis not present

## 2019-01-05 DIAGNOSIS — E1129 Type 2 diabetes mellitus with other diabetic kidney complication: Secondary | ICD-10-CM | POA: Diagnosis not present

## 2019-01-05 DIAGNOSIS — J188 Other pneumonia, unspecified organism: Secondary | ICD-10-CM | POA: Diagnosis not present

## 2019-01-07 DIAGNOSIS — R531 Weakness: Secondary | ICD-10-CM | POA: Diagnosis not present

## 2019-01-07 DIAGNOSIS — I130 Hypertensive heart and chronic kidney disease with heart failure and stage 1 through stage 4 chronic kidney disease, or unspecified chronic kidney disease: Secondary | ICD-10-CM | POA: Diagnosis not present

## 2019-01-07 DIAGNOSIS — R350 Frequency of micturition: Secondary | ICD-10-CM | POA: Diagnosis not present

## 2019-01-07 DIAGNOSIS — Z9181 History of falling: Secondary | ICD-10-CM | POA: Diagnosis not present

## 2019-01-07 DIAGNOSIS — I2510 Atherosclerotic heart disease of native coronary artery without angina pectoris: Secondary | ICD-10-CM | POA: Diagnosis not present

## 2019-01-07 DIAGNOSIS — I480 Paroxysmal atrial fibrillation: Secondary | ICD-10-CM | POA: Diagnosis not present

## 2019-01-07 DIAGNOSIS — I5032 Chronic diastolic (congestive) heart failure: Secondary | ICD-10-CM | POA: Diagnosis not present

## 2019-01-07 DIAGNOSIS — M1A3710 Chronic gout due to renal impairment, right ankle and foot, without tophus (tophi): Secondary | ICD-10-CM | POA: Diagnosis not present

## 2019-01-07 DIAGNOSIS — J8410 Pulmonary fibrosis, unspecified: Secondary | ICD-10-CM | POA: Diagnosis not present

## 2019-01-07 DIAGNOSIS — M5090 Cervical disc disorder, unspecified, unspecified cervical region: Secondary | ICD-10-CM | POA: Diagnosis not present

## 2019-01-07 DIAGNOSIS — Z96653 Presence of artificial knee joint, bilateral: Secondary | ICD-10-CM | POA: Diagnosis not present

## 2019-01-07 DIAGNOSIS — F0390 Unspecified dementia without behavioral disturbance: Secondary | ICD-10-CM | POA: Diagnosis not present

## 2019-01-07 DIAGNOSIS — N183 Chronic kidney disease, stage 3 (moderate): Secondary | ICD-10-CM | POA: Diagnosis not present

## 2019-01-07 DIAGNOSIS — Z8673 Personal history of transient ischemic attack (TIA), and cerebral infarction without residual deficits: Secondary | ICD-10-CM | POA: Diagnosis not present

## 2019-01-07 DIAGNOSIS — Z981 Arthrodesis status: Secondary | ICD-10-CM | POA: Diagnosis not present

## 2019-01-07 DIAGNOSIS — E1122 Type 2 diabetes mellitus with diabetic chronic kidney disease: Secondary | ICD-10-CM | POA: Diagnosis not present

## 2019-01-08 DIAGNOSIS — M1A3710 Chronic gout due to renal impairment, right ankle and foot, without tophus (tophi): Secondary | ICD-10-CM | POA: Diagnosis not present

## 2019-01-08 DIAGNOSIS — Z96653 Presence of artificial knee joint, bilateral: Secondary | ICD-10-CM | POA: Diagnosis not present

## 2019-01-08 DIAGNOSIS — E1122 Type 2 diabetes mellitus with diabetic chronic kidney disease: Secondary | ICD-10-CM | POA: Diagnosis not present

## 2019-01-08 DIAGNOSIS — R350 Frequency of micturition: Secondary | ICD-10-CM | POA: Diagnosis not present

## 2019-01-08 DIAGNOSIS — N183 Chronic kidney disease, stage 3 (moderate): Secondary | ICD-10-CM | POA: Diagnosis not present

## 2019-01-08 DIAGNOSIS — Z8673 Personal history of transient ischemic attack (TIA), and cerebral infarction without residual deficits: Secondary | ICD-10-CM | POA: Diagnosis not present

## 2019-01-08 DIAGNOSIS — M5090 Cervical disc disorder, unspecified, unspecified cervical region: Secondary | ICD-10-CM | POA: Diagnosis not present

## 2019-01-08 DIAGNOSIS — Z9181 History of falling: Secondary | ICD-10-CM | POA: Diagnosis not present

## 2019-01-08 DIAGNOSIS — I130 Hypertensive heart and chronic kidney disease with heart failure and stage 1 through stage 4 chronic kidney disease, or unspecified chronic kidney disease: Secondary | ICD-10-CM | POA: Diagnosis not present

## 2019-01-08 DIAGNOSIS — I480 Paroxysmal atrial fibrillation: Secondary | ICD-10-CM | POA: Diagnosis not present

## 2019-01-08 DIAGNOSIS — J8410 Pulmonary fibrosis, unspecified: Secondary | ICD-10-CM | POA: Diagnosis not present

## 2019-01-08 DIAGNOSIS — F0390 Unspecified dementia without behavioral disturbance: Secondary | ICD-10-CM | POA: Diagnosis not present

## 2019-01-08 DIAGNOSIS — Z981 Arthrodesis status: Secondary | ICD-10-CM | POA: Diagnosis not present

## 2019-01-08 DIAGNOSIS — R531 Weakness: Secondary | ICD-10-CM | POA: Diagnosis not present

## 2019-01-08 DIAGNOSIS — I2510 Atherosclerotic heart disease of native coronary artery without angina pectoris: Secondary | ICD-10-CM | POA: Diagnosis not present

## 2019-01-08 DIAGNOSIS — I5032 Chronic diastolic (congestive) heart failure: Secondary | ICD-10-CM | POA: Diagnosis not present

## 2019-01-12 DIAGNOSIS — E8809 Other disorders of plasma-protein metabolism, not elsewhere classified: Secondary | ICD-10-CM | POA: Diagnosis not present

## 2019-01-12 DIAGNOSIS — E1129 Type 2 diabetes mellitus with other diabetic kidney complication: Secondary | ICD-10-CM | POA: Diagnosis not present

## 2019-01-12 DIAGNOSIS — E785 Hyperlipidemia, unspecified: Secondary | ICD-10-CM | POA: Diagnosis not present

## 2019-01-12 DIAGNOSIS — N183 Chronic kidney disease, stage 3 (moderate): Secondary | ICD-10-CM | POA: Diagnosis not present

## 2019-01-13 DIAGNOSIS — N183 Chronic kidney disease, stage 3 (moderate): Secondary | ICD-10-CM | POA: Diagnosis not present

## 2019-01-13 DIAGNOSIS — R350 Frequency of micturition: Secondary | ICD-10-CM | POA: Diagnosis not present

## 2019-01-13 DIAGNOSIS — I2510 Atherosclerotic heart disease of native coronary artery without angina pectoris: Secondary | ICD-10-CM | POA: Diagnosis not present

## 2019-01-13 DIAGNOSIS — I5032 Chronic diastolic (congestive) heart failure: Secondary | ICD-10-CM | POA: Diagnosis not present

## 2019-01-13 DIAGNOSIS — I480 Paroxysmal atrial fibrillation: Secondary | ICD-10-CM | POA: Diagnosis not present

## 2019-01-13 DIAGNOSIS — J8410 Pulmonary fibrosis, unspecified: Secondary | ICD-10-CM | POA: Diagnosis not present

## 2019-01-13 DIAGNOSIS — R531 Weakness: Secondary | ICD-10-CM | POA: Diagnosis not present

## 2019-01-13 DIAGNOSIS — Z981 Arthrodesis status: Secondary | ICD-10-CM | POA: Diagnosis not present

## 2019-01-13 DIAGNOSIS — M1A3710 Chronic gout due to renal impairment, right ankle and foot, without tophus (tophi): Secondary | ICD-10-CM | POA: Diagnosis not present

## 2019-01-13 DIAGNOSIS — Z96653 Presence of artificial knee joint, bilateral: Secondary | ICD-10-CM | POA: Diagnosis not present

## 2019-01-13 DIAGNOSIS — M5090 Cervical disc disorder, unspecified, unspecified cervical region: Secondary | ICD-10-CM | POA: Diagnosis not present

## 2019-01-13 DIAGNOSIS — E1122 Type 2 diabetes mellitus with diabetic chronic kidney disease: Secondary | ICD-10-CM | POA: Diagnosis not present

## 2019-01-13 DIAGNOSIS — I130 Hypertensive heart and chronic kidney disease with heart failure and stage 1 through stage 4 chronic kidney disease, or unspecified chronic kidney disease: Secondary | ICD-10-CM | POA: Diagnosis not present

## 2019-01-13 DIAGNOSIS — Z8673 Personal history of transient ischemic attack (TIA), and cerebral infarction without residual deficits: Secondary | ICD-10-CM | POA: Diagnosis not present

## 2019-01-13 DIAGNOSIS — Z9181 History of falling: Secondary | ICD-10-CM | POA: Diagnosis not present

## 2019-01-13 DIAGNOSIS — F0390 Unspecified dementia without behavioral disturbance: Secondary | ICD-10-CM | POA: Diagnosis not present

## 2019-01-28 DIAGNOSIS — I509 Heart failure, unspecified: Secondary | ICD-10-CM | POA: Diagnosis not present

## 2019-01-28 DIAGNOSIS — K6289 Other specified diseases of anus and rectum: Secondary | ICD-10-CM | POA: Diagnosis not present

## 2019-01-28 DIAGNOSIS — M109 Gout, unspecified: Secondary | ICD-10-CM | POA: Diagnosis not present

## 2019-01-28 DIAGNOSIS — I4891 Unspecified atrial fibrillation: Secondary | ICD-10-CM | POA: Diagnosis not present

## 2019-02-02 DIAGNOSIS — J309 Allergic rhinitis, unspecified: Secondary | ICD-10-CM | POA: Diagnosis not present

## 2019-02-02 DIAGNOSIS — K6289 Other specified diseases of anus and rectum: Secondary | ICD-10-CM | POA: Diagnosis not present

## 2019-02-02 DIAGNOSIS — N183 Chronic kidney disease, stage 3 (moderate): Secondary | ICD-10-CM | POA: Diagnosis not present

## 2019-02-02 DIAGNOSIS — I129 Hypertensive chronic kidney disease with stage 1 through stage 4 chronic kidney disease, or unspecified chronic kidney disease: Secondary | ICD-10-CM | POA: Diagnosis not present

## 2019-02-11 DIAGNOSIS — R635 Abnormal weight gain: Secondary | ICD-10-CM | POA: Diagnosis not present

## 2019-02-11 DIAGNOSIS — R197 Diarrhea, unspecified: Secondary | ICD-10-CM | POA: Diagnosis not present

## 2019-02-11 DIAGNOSIS — I129 Hypertensive chronic kidney disease with stage 1 through stage 4 chronic kidney disease, or unspecified chronic kidney disease: Secondary | ICD-10-CM | POA: Diagnosis not present

## 2019-02-11 DIAGNOSIS — R2681 Unsteadiness on feet: Secondary | ICD-10-CM | POA: Diagnosis not present

## 2019-02-12 DIAGNOSIS — R635 Abnormal weight gain: Secondary | ICD-10-CM | POA: Diagnosis not present

## 2019-02-12 DIAGNOSIS — R197 Diarrhea, unspecified: Secondary | ICD-10-CM | POA: Diagnosis not present

## 2019-02-12 DIAGNOSIS — I129 Hypertensive chronic kidney disease with stage 1 through stage 4 chronic kidney disease, or unspecified chronic kidney disease: Secondary | ICD-10-CM | POA: Diagnosis not present

## 2019-02-12 DIAGNOSIS — R2681 Unsteadiness on feet: Secondary | ICD-10-CM | POA: Diagnosis not present

## 2019-02-19 DIAGNOSIS — R05 Cough: Secondary | ICD-10-CM | POA: Diagnosis not present

## 2019-02-23 DIAGNOSIS — J8410 Pulmonary fibrosis, unspecified: Secondary | ICD-10-CM | POA: Diagnosis not present

## 2019-02-23 DIAGNOSIS — R0781 Pleurodynia: Secondary | ICD-10-CM | POA: Diagnosis not present

## 2019-02-23 DIAGNOSIS — J309 Allergic rhinitis, unspecified: Secondary | ICD-10-CM | POA: Diagnosis not present

## 2019-02-23 DIAGNOSIS — R05 Cough: Secondary | ICD-10-CM | POA: Diagnosis not present

## 2019-02-24 DIAGNOSIS — R0781 Pleurodynia: Secondary | ICD-10-CM | POA: Diagnosis not present

## 2019-02-24 DIAGNOSIS — J309 Allergic rhinitis, unspecified: Secondary | ICD-10-CM | POA: Diagnosis not present

## 2019-02-24 DIAGNOSIS — J8410 Pulmonary fibrosis, unspecified: Secondary | ICD-10-CM | POA: Diagnosis not present

## 2019-02-24 DIAGNOSIS — R05 Cough: Secondary | ICD-10-CM | POA: Diagnosis not present

## 2019-03-04 DIAGNOSIS — K5909 Other constipation: Secondary | ICD-10-CM | POA: Diagnosis not present

## 2019-03-04 DIAGNOSIS — M109 Gout, unspecified: Secondary | ICD-10-CM | POA: Diagnosis not present

## 2019-03-04 DIAGNOSIS — K219 Gastro-esophageal reflux disease without esophagitis: Secondary | ICD-10-CM | POA: Diagnosis not present

## 2019-03-04 DIAGNOSIS — R197 Diarrhea, unspecified: Secondary | ICD-10-CM | POA: Diagnosis not present

## 2019-03-08 ENCOUNTER — Telehealth: Payer: Self-pay | Admitting: *Deleted

## 2019-03-08 NOTE — Telephone Encounter (Signed)
Mr.E[place]ington wife will call back to schedule.

## 2019-03-20 ENCOUNTER — Emergency Department
Admission: EM | Admit: 2019-03-20 | Discharge: 2019-03-21 | Disposition: A | Discharge status: Home / Self Care | Payer: Medicare Other | Attending: Emergency Medicine | Admitting: Emergency Medicine

## 2019-03-20 ENCOUNTER — Emergency Department: Payer: Medicare Other

## 2019-03-20 ENCOUNTER — Other Ambulatory Visit: Payer: Self-pay

## 2019-03-20 ENCOUNTER — Encounter: Payer: Self-pay | Admitting: Emergency Medicine

## 2019-03-20 DIAGNOSIS — Z743 Need for continuous supervision: Secondary | ICD-10-CM | POA: Diagnosis not present

## 2019-03-20 DIAGNOSIS — Z96653 Presence of artificial knee joint, bilateral: Secondary | ICD-10-CM | POA: Insufficient documentation

## 2019-03-20 DIAGNOSIS — Z79899 Other long term (current) drug therapy: Secondary | ICD-10-CM | POA: Diagnosis not present

## 2019-03-20 DIAGNOSIS — I130 Hypertensive heart and chronic kidney disease with heart failure and stage 1 through stage 4 chronic kidney disease, or unspecified chronic kidney disease: Secondary | ICD-10-CM | POA: Insufficient documentation

## 2019-03-20 DIAGNOSIS — N183 Chronic kidney disease, stage 3 unspecified: Secondary | ICD-10-CM | POA: Diagnosis not present

## 2019-03-20 DIAGNOSIS — I5032 Chronic diastolic (congestive) heart failure: Secondary | ICD-10-CM | POA: Insufficient documentation

## 2019-03-20 DIAGNOSIS — R279 Unspecified lack of coordination: Secondary | ICD-10-CM | POA: Diagnosis not present

## 2019-03-20 DIAGNOSIS — Z87891 Personal history of nicotine dependence: Secondary | ICD-10-CM | POA: Diagnosis not present

## 2019-03-20 DIAGNOSIS — Z20828 Contact with and (suspected) exposure to other viral communicable diseases: Secondary | ICD-10-CM | POA: Diagnosis not present

## 2019-03-20 DIAGNOSIS — Z03818 Encounter for observation for suspected exposure to other biological agents ruled out: Secondary | ICD-10-CM | POA: Diagnosis not present

## 2019-03-20 DIAGNOSIS — S199XXA Unspecified injury of neck, initial encounter: Secondary | ICD-10-CM | POA: Diagnosis not present

## 2019-03-20 DIAGNOSIS — R0902 Hypoxemia: Secondary | ICD-10-CM | POA: Diagnosis not present

## 2019-03-20 DIAGNOSIS — J9811 Atelectasis: Secondary | ICD-10-CM | POA: Diagnosis not present

## 2019-03-20 DIAGNOSIS — E1122 Type 2 diabetes mellitus with diabetic chronic kidney disease: Secondary | ICD-10-CM | POA: Diagnosis not present

## 2019-03-20 DIAGNOSIS — Z794 Long term (current) use of insulin: Secondary | ICD-10-CM | POA: Insufficient documentation

## 2019-03-20 DIAGNOSIS — R0689 Other abnormalities of breathing: Secondary | ICD-10-CM | POA: Diagnosis not present

## 2019-03-20 DIAGNOSIS — Z7982 Long term (current) use of aspirin: Secondary | ICD-10-CM | POA: Diagnosis not present

## 2019-03-20 DIAGNOSIS — I2510 Atherosclerotic heart disease of native coronary artery without angina pectoris: Secondary | ICD-10-CM | POA: Diagnosis not present

## 2019-03-20 DIAGNOSIS — E1165 Type 2 diabetes mellitus with hyperglycemia: Secondary | ICD-10-CM | POA: Diagnosis not present

## 2019-03-20 DIAGNOSIS — R4182 Altered mental status, unspecified: Secondary | ICD-10-CM | POA: Diagnosis present

## 2019-03-20 DIAGNOSIS — R410 Disorientation, unspecified: Secondary | ICD-10-CM

## 2019-03-20 DIAGNOSIS — R404 Transient alteration of awareness: Secondary | ICD-10-CM | POA: Diagnosis not present

## 2019-03-20 DIAGNOSIS — S0990XA Unspecified injury of head, initial encounter: Secondary | ICD-10-CM | POA: Diagnosis not present

## 2019-03-20 DIAGNOSIS — I10 Essential (primary) hypertension: Secondary | ICD-10-CM | POA: Diagnosis not present

## 2019-03-20 LAB — URINE DRUG SCREEN, QUALITATIVE (ARMC ONLY)
Amphetamines, Ur Screen: NOT DETECTED
Barbiturates, Ur Screen: NOT DETECTED
Benzodiazepine, Ur Scrn: NOT DETECTED
Cannabinoid 50 Ng, Ur ~~LOC~~: NOT DETECTED
Cocaine Metabolite,Ur ~~LOC~~: NOT DETECTED
MDMA (Ecstasy)Ur Screen: NOT DETECTED
Methadone Scn, Ur: NOT DETECTED
Opiate, Ur Screen: NOT DETECTED
Phencyclidine (PCP) Ur S: NOT DETECTED
Tricyclic, Ur Screen: NOT DETECTED

## 2019-03-20 LAB — BLOOD GAS, VENOUS
Acid-Base Excess: 5.8 mmol/L — ABNORMAL HIGH (ref 0.0–2.0)
Bicarbonate: 32.5 mmol/L — ABNORMAL HIGH (ref 20.0–28.0)
O2 Saturation: 53.6 %
Patient temperature: 37
pCO2, Ven: 55 mmHg (ref 44.0–60.0)
pH, Ven: 7.38 (ref 7.250–7.430)
pO2, Ven: <31 mmHg — CL (ref 32.0–45.0)

## 2019-03-20 LAB — COMPREHENSIVE METABOLIC PANEL
ALT: 15 U/L (ref 0–44)
AST: 24 U/L (ref 15–41)
Albumin: 3.5 g/dL (ref 3.5–5.0)
Alkaline Phosphatase: 93 U/L (ref 38–126)
Anion gap: 9 (ref 5–15)
BUN: 44 mg/dL — ABNORMAL HIGH (ref 8–23)
CO2: 27 mmol/L (ref 22–32)
Calcium: 8.9 mg/dL (ref 8.9–10.3)
Chloride: 101 mmol/L (ref 98–111)
Creatinine, Ser: 1.97 mg/dL — ABNORMAL HIGH (ref 0.61–1.24)
GFR calc Af Amer: 35 mL/min — ABNORMAL LOW (ref >60)
GFR calc non Af Amer: 30 mL/min — ABNORMAL LOW (ref >60)
Glucose, Bld: 331 mg/dL — ABNORMAL HIGH (ref 70–99)
Potassium: 4.3 mmol/L (ref 3.5–5.1)
Sodium: 137 mmol/L (ref 135–145)
Total Bilirubin: 0.5 mg/dL (ref 0.3–1.2)
Total Protein: 5.9 g/dL — ABNORMAL LOW (ref 6.5–8.1)

## 2019-03-20 LAB — CBC WITH DIFFERENTIAL/PLATELET
Abs Immature Granulocytes: 0.05 10*3/uL (ref 0.00–0.07)
Basophils Absolute: 0.2 10*3/uL — ABNORMAL HIGH (ref 0.0–0.1)
Basophils Relative: 2 %
Eosinophils Absolute: 1.1 10*3/uL — ABNORMAL HIGH (ref 0.0–0.5)
Eosinophils Relative: 12 %
HCT: 41.6 % (ref 39.0–52.0)
Hemoglobin: 13.4 g/dL (ref 13.0–17.0)
Immature Granulocytes: 1 %
Lymphocytes Relative: 16 %
Lymphs Abs: 1.6 10*3/uL (ref 0.7–4.0)
MCH: 31.1 pg (ref 26.0–34.0)
MCHC: 32.2 g/dL (ref 30.0–36.0)
MCV: 96.5 fL (ref 80.0–100.0)
Monocytes Absolute: 0.9 10*3/uL (ref 0.1–1.0)
Monocytes Relative: 9 %
Neutro Abs: 6 10*3/uL (ref 1.7–7.7)
Neutrophils Relative %: 60 %
Platelets: 201 10*3/uL (ref 150–400)
RBC: 4.31 MIL/uL (ref 4.22–5.81)
RDW: 14.6 % (ref 11.5–15.5)
WBC: 9.9 10*3/uL (ref 4.0–10.5)
nRBC: 0 % (ref 0.0–0.2)

## 2019-03-20 LAB — URINALYSIS, ROUTINE W REFLEX MICROSCOPIC
Bacteria, UA: NONE SEEN
Bilirubin Urine: NEGATIVE
Glucose, UA: >=500 mg/dL — AB
Hgb urine dipstick: NEGATIVE
Ketones, ur: NEGATIVE mg/dL
Leukocytes,Ua: NEGATIVE
Nitrite: NEGATIVE
Protein, ur: 30 mg/dL — AB
Specific Gravity, Urine: 1.012 (ref 1.005–1.030)
Squamous Epithelial / HPF: NONE SEEN (ref 0–5)
WBC, UA: NONE SEEN WBC/hpf (ref 0–5)
pH: 6 (ref 5.0–8.0)

## 2019-03-20 LAB — BRAIN NATRIURETIC PEPTIDE: B Natriuretic Peptide: 199 pg/mL — ABNORMAL HIGH (ref 0.0–100.0)

## 2019-03-20 LAB — TROPONIN I (HIGH SENSITIVITY)
Troponin I (High Sensitivity): 21 ng/L — ABNORMAL HIGH (ref <18)
Troponin I (High Sensitivity): 23 ng/L — ABNORMAL HIGH (ref <18)

## 2019-03-20 LAB — TSH: TSH: 2.353 u[IU]/mL (ref 0.350–4.500)

## 2019-03-20 LAB — T4, FREE: Free T4: 1.04 ng/dL (ref 0.61–1.12)

## 2019-03-20 LAB — SARS CORONAVIRUS 2 BY RT PCR (HOSPITAL ORDER, PERFORMED IN ~~LOC~~ HOSPITAL LAB): SARS Coronavirus 2: NEGATIVE

## 2019-03-20 LAB — BETA-HYDROXYBUTYRIC ACID: Beta-Hydroxybutyric Acid: 0.13 mmol/L (ref 0.05–0.27)

## 2019-03-20 LAB — ETHANOL: Alcohol, Ethyl (B): <10 mg/dL (ref <10)

## 2019-03-20 MED ORDER — HYDRALAZINE HCL 50 MG PO TABS
25.0000 mg | ORAL_TABLET | Freq: Once | ORAL | Status: AC
  Administered 2019-03-20: 25 mg via ORAL
  Filled 2019-03-20: qty 1

## 2019-03-20 NOTE — ED Provider Notes (Signed)
[name] Emergency Department Provider Note  ____________________________________________   First MD Initiated Contact with Patient 03/20/19 1931     (approximate)  I have reviewed the triage vital signs and the nursing notes.   HISTORY  Chief Complaint Altered Mental Status    HPI Justin Leach is a 85 y.o. male with CHF, CKD, hypertension, diabetes who resides at a assisted living who now presents with altered mental status.  Per the facility his baseline is alert and oriented x4.  They noted today that he seemed more confused.  He is only oriented x1.  From what I was told there was no obvious fall.  He denies any chest pain or abdominal pain.  Endorses maybe a little bit of shortness of breath.  His sugar was elevated in the 500s with EMS.  Unable to get full HPI due to patient's altered mental status.  Past Medical History:  Diagnosis Date   Bradycardia    a. during 02/2015 admission - occasional HR in 40s while being treated for atrial fib.   Cervical disc disease    Chronic diastolic CHF (congestive heart failure) ([place])    a. Dx 02/2015 -  2D echo 03/02/15 showed severe focal basal hypertrophy of the septum, EF 55-60%, mild MR, no effusion.   CKD (chronic kidney disease), stage III (HCC)    Coronary artery calcification seen on CT scan    a. Nuc 02/2015 was normal.   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 Dx 02/2015 - in and out on tele, rx'd Coumadin and amiodarone.   Pneumonia 2007   Shortness of breath dyspnea    WITH EXERTION   Type II diabetes mellitus ([place])     Patient Active Problem List   Diagnosis Date Noted   Interstitial pulmonary disease ([place]) 07/29/2018   Pneumonia 07/20/2018   Palliative care encounter 06/26/2018   Weakness  generalized 06/26/2018   HCAP (healthcare-associated pneumonia) 06/22/2018   Gouty arthritis of right great toe 06/05/2018   Hypertensive heart and kidney disease with chronic diastolic congestive heart failure and stage 3 chronic kidney disease ([place]) 06/01/2018   Dyslipidemia associated with type 2 diabetes mellitus ([place]) 06/01/2018   Chronic constipation 06/01/2018   CKD stage 3 due to type 2 diabetes mellitus ([place]) 06/01/2018   Volume overload 05/20/2018   Fall 05/13/2018   SDH (subdural hematoma) (HCC) 05/13/2018   Closed comminuted left humeral fracture 05/13/2018   Hyperkalemia 05/13/2018   Leukocytosis 05/13/2018   Fall at home    Subdural hematoma without coma (HCC)    Cough    GERD (gastroesophageal reflux disease)    Pulmonary fibrosis (HCC)    Coronary artery calcification seen on CT scan    Chronic diastolic CHF (congestive heart failure) (HCC)    PAF (paroxysmal atrial fibrillation) ([place])    CKD (chronic kidney disease), stage III ([place])    Type II diabetes mellitus with renal manifestations ([place])    Essential hypertension    Chest pain 02/28/2015   Renal failure (ARF), acute on chronic ([place]) 02/28/2015   Hyperlipidemia 02/28/2015   Dyspnea 03/29/2014   DOE (dyspnea on exertion) 03/29/2014   Bradycardia 03/29/2014   Bronchospasm 05/15/2013    Past Surgical History:  Procedure Laterality Date   CATARACT EXTRACTION W/ INTRAOCULAR LENS  IMPLANT, BILATERAL Bilateral    CERVICAL DISC SURGERY     "i've had 3 neck ORs; not sure what kind; all thru the back of my neck"   CHOLECYSTECTOMY     COLONOSCOPY WITH PROPOFOL N/A 01/02/2016   Procedure: COLONOSCOPY WITH PROPOFOL;  Surgeon: [name], MD;  Location: [facility];  Service: Endoscopy;  Laterality: N/A;   EYE SURGERY  1930's   "don't know what for" (05/11/2013)   JOINT REPLACEMENT     bilateral knees, elbows, and shoulders (on 05/11/2013 pt denies all joint replacements"      KNEE ARTHROPLASTY     "had one scope; one open knee OR; not sure which on which side" (05/10/2013)   KNEE ARTHROSCOPY     "had one scope; one open knee OR; not sure which on which side" (05/10/2013)   POSTERIOR FUSION CERVICAL SPINE     REVERSE SHOULDER ARTHROPLASTY Left 05/19/2018   Procedure: REVERSE SHOULDER ARTHROPLASTY;  Surgeon: [name], MD;  Location: [facility];  Service: Orthopedics;  Laterality: Left;   ROTATOR CUFF REPAIR Bilateral    VIDEO BRONCHOSCOPY Bilateral 02/01/2016   Procedure: VIDEO BRONCHOSCOPY WITHOUT FLUORO;  Surgeon: Marshell [name], MD;  Location: [facility];  Service: Cardiopulmonary;  Laterality: Bilateral;    Prior to Admission medications   Medication Sig Start Date End Date Taking? Authorizing Provider  acetaminophen (TYLENOL) 325 MG tablet Take 650 mg by mouth every 6 (six) hours as needed for mild pain or moderate pain.  06/11/18   [provider]  aspirin 81 MG chewable tablet Chew 1 tablet (81 mg total) by mouth daily. 07/23/18   Loletha Grayer, MD  atorvastatin (LIPITOR) 40 MG tablet Take 40 mg by mouth at bedtime.  05/29/18   [provider]  bisacodyl (DULCOLAX) 10 MG suppository Place 1 suppository (10 mg total) rectally daily as needed for moderate constipation. 05/27/18   Rai, Ripudeep K, MD  carbamide peroxide (DEBROX) 6.5 % OTIC solution Place 5 drops into both ears 2 (two) times daily. Patient taking differently: Place 5 drops into both ears 2 (two) times daily as needed.  07/23/18   Loletha Grayer, MD  citalopram (CELEXA) 20 MG tablet Take 20 mg by mouth daily.    [provider]  febuxostat (ULORIC) 40 MG tablet Take 40 mg by mouth daily. 06/02/18   [provider]  furosemide (LASIX) 40 MG tablet Take 40 mg by mouth daily. 10/16/18   [provider]  hydrALAZINE (APRESOLINE) 25 MG tablet Take 1 tablet (25 mg total) by mouth every 8 (eight) hours. 05/27/18   Rai, Ripudeep K, MD  insulin lispro  (ADMELOG) 100 UNIT/ML injection Inject 8 Units into the skin 3 (three) times daily with meals. Sliding Scale:  000-199: 0u 200-249: 2u 250-299: 4u 300-349: 6u 350-399: 8u 400-449: 10u 450-499: 12u 500+ : contact physician    [provider]  isosorbide mononitrate (IMDUR) 60 MG 24 hr tablet Take 1 tablet (60 mg total) by mouth daily. 07/23/18   Loletha Grayer, MD  LANTUS SOLOSTAR 100 UNIT/ML Solostar Pen Inject 23 Units into the skin at bedtime.  10/26/18   [provider]  loperamide (IMODIUM) 2 MG capsule Take 2 mg by mouth every 8 (eight) hours as needed for diarrhea or loose stools.    [provider]  Metoprolol Tartrate 75 MG TABS Take 75 mg by mouth 2 (two) times daily.     [provider]  Multiple Vitamin (MULTIVITAMIN WITH MINERALS) TABS tablet  Take 1 tablet by mouth daily.    [provider]  nitroGLYCERIN (NITROSTAT) 0.4 MG SL tablet Place 0.4 mg under the tongue every 5 (five) minutes as needed for chest pain (max 3 doses).  07/20/18   [provider]  omeprazole (PRILOSEC) 20 MG capsule Take 20 mg by mouth daily.  02/26/13   [provider]  ondansetron (ZOFRAN) 4 MG tablet Take 4 mg by mouth every 4 (four) hours as needed for nausea or vomiting.    [provider]  polyethylene glycol (MIRALAX / GLYCOLAX) packet Take 17 g by mouth daily as needed for mild constipation. 05/27/18   Rai, Vernelle Emerald, MD    Allergies Amiodarone and Nifedipine  Family History  Problem Relation Age of Onset   Diabetes Mellitus II Paternal Grandmother    Heart disease Neg Hx        He does not know his father's history.    Cancer Neg Hx    Diabetes Neg Hx    CAD Neg Hx    Lung disease Neg Hx    Rheumatologic disease Neg Hx     Social History Social History   Tobacco Use   Smoking status: Never Smoker   Smokeless tobacco: Former Systems developer    Types: Chew  Substance Use Topics   Alcohol use: No   Drug use: No     Comment: used chew when I was a teenager per patient       Review of Systems Unable to get full review of system due to patient altered mental status.  ________________________   PHYSICAL EXAM:  VITAL SIGNS: Blood pressure (!) 196/99, pulse (!) 57, temperature 98.2 F (36.8 C), temperature source Oral, resp. rate (!) 29, SpO2 91 %.  Constitutional: Alert and oriented x1 Well appearing and in no acute distress. Eyes: Conjunctivae are normal. EOMI. Head: Atraumatic. Nose: No congestion/rhinnorhea. Mouth/Throat: Mucous membranes are moist.   Neck: No stridor. Trachea Midline. FROM Cardiovascular: Normal rate, regular rhythm. Grossly normal heart sounds.  Good peripheral circulation. Respiratory: Normal respiratory effort.  No retractions. Lungs CTAB. Gastrointestinal: Soft and nontender. No distention. No abdominal bruits.  Musculoskeletal: No lower extremity tenderness nor edema.  No joint effusions. Neurologic:  No gross focal neurologic deficits are appreciated.  Equal strength in his arms and his legs but does appear confused. Skin:  Skin is warm, dry and intact. No rash noted. Psychiatric: Mood and affect are normal. Speech and behavior are normal. GU: Deferred   ____________________________________________   LABS (all labs ordered are listed, but only abnormal results are displayed)  Labs Reviewed  CBC WITH DIFFERENTIAL/PLATELET - Abnormal; Notable for the following components:      Result Value   Eosinophils Absolute 1.1 (*)    Basophils Absolute 0.2 (*)    All other components within normal limits  COMPREHENSIVE METABOLIC PANEL - Abnormal; Notable for the following components:   Glucose, Bld 331 (*)    BUN 44 (*)    Creatinine, Ser 1.97 (*)    Total Protein 5.9 (*)    GFR calc non Af Amer 30 (*)    GFR calc Af Amer 35 (*)    All other components within normal limits  BLOOD GAS, VENOUS - Abnormal; Notable for the following components:   pO2, Ven <31.0 (*)     Bicarbonate 32.5 (*)    Acid-Base Excess 5.8 (*)    All other components within normal limits  URINALYSIS, ROUTINE W REFLEX MICROSCOPIC - Abnormal; Notable  for the following components:   Color, Urine YELLOW (*)    APPearance CLEAR (*)    Glucose, UA >=500 (*)    Protein, ur 30 (*)    All other components within normal limits  BRAIN NATRIURETIC PEPTIDE - Abnormal; Notable for the following components:   B Natriuretic Peptide 199.0 (*)    All other components within normal limits  TROPONIN I (HIGH SENSITIVITY) - Abnormal; Notable for the following components:   Troponin I (High Sensitivity) 21 (*)    All other components within normal limits  SARS CORONAVIRUS 2 BY RT PCR (HOSPITAL ORDER, [place] [facility])  BETA-HYDROXYBUTYRIC ACID  TSH  T4, FREE  URINE DRUG SCREEN, QUALITATIVE ([facility] ONLY)  ETHANOL  TROPONIN I (HIGH SENSITIVITY)   ____________________________________________   ED ECG REPORT I, Vanessa [place], the attending physician, personally viewed and interpreted this ECG.  EKG is normal sinus rate of 79, no ST elevation, no T wave inversions, normal intervals, PACs ____________________________________________  RADIOLOGY I, Vanessa [place], personally viewed and evaluated these images (plain radiographs) as part of my medical decision making, as well as reviewing the written report by the radiologist.  ED MD interpretation: X-ray negative of his chest.  Official radiology report(s): Ct Head Wo Contrast  Result Date: 03/20/2019 CLINICAL DATA:  85 year old male with head trauma. EXAM: CT HEAD WITHOUT CONTRAST CT CERVICAL SPINE WITHOUT CONTRAST TECHNIQUE: Multidetector CT imaging of the head and cervical spine was performed following the standard protocol without intravenous contrast. Multiplanar CT image reconstructions of the cervical spine were also generated. COMPARISON:  Head CT dated 11/21/2018 FINDINGS: CT HEAD FINDINGS Brain: Mild to moderate  age-related atrophy and chronic microvascular ischemic changes. There is no acute intracranial hemorrhage. No mass effect or midline shift. No extra-axial fluid collection. Vascular: No hyperdense vessel or unexpected calcification. Skull: Normal. Negative for fracture or focal lesion. Sinuses/Orbits: No acute finding. Other: A 1 cm lesion in the right parietal scalp, likely a sebaceous cyst. CT CERVICAL SPINE FINDINGS Alignment: No acute subluxation. Grade 1 C3-C4 and C7-T1 anterolisthesis. Skull base and vertebrae: No acute fracture. Osteopenia. Soft tissues and spinal canal: No prevertebral fluid or swelling. No visible canal hematoma. Disc levels: C5-C6 ACDF. Bony fusion at C5-C7. There is mild bilateral neural foramina narrowing at C3-C4. Upper chest: Negative. Other: Bilateral carotid bulb calcified plaques. IMPRESSION: 1. No acute intracranial hemorrhage. Age-related atrophy and chronic microvascular ischemic changes. 2. No acute/traumatic cervical spine pathology. C5-C6 ACDF. Electronically Signed   By: Anner Crete M.D.   On: 03/20/2019 20:52   Ct Cervical Spine Wo Contrast  Result Date: 03/20/2019 CLINICAL DATA:  85 year old male with head trauma. EXAM: CT HEAD WITHOUT CONTRAST CT CERVICAL SPINE WITHOUT CONTRAST TECHNIQUE: Multidetector CT imaging of the head and cervical spine was performed following the standard protocol without intravenous contrast. Multiplanar CT image reconstructions of the cervical spine were also generated. COMPARISON:  Head CT dated 11/21/2018 FINDINGS: CT HEAD FINDINGS Brain: Mild to moderate age-related atrophy and chronic microvascular ischemic changes. There is no acute intracranial hemorrhage. No mass effect or midline shift. No extra-axial fluid collection. Vascular: No hyperdense vessel or unexpected calcification. Skull: Normal. Negative for fracture or focal lesion. Sinuses/Orbits: No acute finding. Other: A 1 cm lesion in the right parietal scalp, likely a  sebaceous cyst. CT CERVICAL SPINE FINDINGS Alignment: No acute subluxation. Grade 1 C3-C4 and C7-T1 anterolisthesis. Skull base and vertebrae: No acute fracture. Osteopenia. Soft tissues and spinal canal: No prevertebral fluid  or swelling. No visible canal hematoma. Disc levels: C5-C6 ACDF. Bony fusion at C5-C7. There is mild bilateral neural foramina narrowing at C3-C4. Upper chest: Negative. Other: Bilateral carotid bulb calcified plaques. IMPRESSION: 1. No acute intracranial hemorrhage. Age-related atrophy and chronic microvascular ischemic changes. 2. No acute/traumatic cervical spine pathology. C5-C6 ACDF. Electronically Signed   By: Anner Crete M.D.   On: 03/20/2019 20:52   Dg Chest Portable 1 View  Result Date: 03/20/2019 CLINICAL DATA:  Altered mental status, history CHF, chronic kidney disease, hypertension, type II diabetes mellitus, paroxysmal atrial fibrillation EXAM: PORTABLE CHEST 1 VIEW COMPARISON:  Portable exam 1945 hours compared to 07/21/2018 Correlation: CT chest 11/08/2018, 10/31/2015 FINDINGS: Upper normal heart size. Mediastinal contours and pulmonary vascularity normal. Atherosclerotic calcification aorta. Bibasilar atelectasis. Remaining lungs clear. No pleural effusion or pneumothorax. 5 mm RIGHT midlung nodule unchanged since prior CT exams from 2020 and 2017, appears to be a calcified granuloma. IMPRESSION: Bibasilar atelectasis. Electronically Signed   By: Lavonia Dana M.D.   On: 03/20/2019 20:18    ____________________________________________   PROCEDURES  Procedure(s) performed (including Critical Care):  Procedures   ____________________________________________   INITIAL IMPRESSION / ASSESSMENT AND PLAN / ED COURSE  Justin Leach was evaluated in Emergency Department on 03/20/2019 for the symptoms described in the history of present illness. He was evaluated in the context of the global COVID-19 pandemic, which necessitated consideration that the patient  might be at risk for infection with the SARS-CoV-2 virus that causes COVID-19. Institutional protocols and algorithms that pertain to the evaluation of patients at risk for COVID-19 are in a state of rapid change based on information released by regulatory bodies including the CDC and federal and state organizations. These policies and algorithms were followed during the patient's care in the ED.     Patient is an 85 year old who has progressive worsening confusion today.  Patient noted to have elevated sugar.  Will get labs to evaluate for DKA, electrolyte abnormalities, AKI, UTI.  Will get CT head to evaluate for intracranial hemorrhage versus mass versus old stroke.  Patient has no obvious neuro deficits on upon examination to suggest recent stroke.   No evidence of DKA.  Kidney function at baseline.  9:29 PM evaluated patient.  Sugar is 331.  Patient is now at baseline.  Alert and oriented x4.  He denies any concerns at this time.  His blood pressure is up from his baseline.   D/w facility:  Around dinner saying his wife was going to divorce him, increased wob and calmed him down.  Then he laid himself down. Didn't know who is family was.   9:36 PM D/w Juanda Crumble [phone]  Nephew who is the POA.  I explained the above work-up.  According to him has had similar episodes previously.  He thinks it is because he is getting lonely because his wife is not able to be in the facility with him and has not been able to be visitors due to coronavirus.  I offered admission for observation given his elevated blood pressures and elevated troponin versus being discharged home with careful observation at the nursing home.  Per POA pt would prefer to go home given his goals of care.  If he can avoid hospitalization that is what they would want.  Will get repeat troponin and ambulate patient and make sure he continues to be at baseline.  Patient not take his blood pressure medications Saturday which is most likely  why his blood pressure is  elevated here.  Patient is not hypoxic or tachycardic.   11:18 PM patient continues to feel baseline.  Patient able to ambulate.  Cardiac markers are stable.  Given the above conversation with Juanda Crumble will discharge patient back to facility.    ____________________________________________   FINAL CLINICAL IMPRESSION(S) / ED DIAGNOSES   Final diagnoses:  Confusion      MEDICATIONS GIVEN DURING THIS VISIT:  Medications  hydrALAZINE (APRESOLINE) tablet 25 mg (25 mg Oral Given 03/20/19 2217)  D Discharge Orders    None       Note:  This document was prepared using Dragon voice recognition software and may include unintentional dictation errors.   Vanessa [place], MD 03/20/19 [phone]

## 2019-03-20 NOTE — Discharge Instructions (Addendum)
Patient's work-up including CT head was negative.  He had slightly elevated sugars and slightly elevated blood pressure.  He will need to have follow-up with his primary care doctor to adjust his medications if he continues to have elevations in these numbers.  I discussed with Juanda Crumble to prefer to have patient go back given the reassuring work-up.  Return to the ER if he has a new episode of concerns.

## 2019-03-20 NOTE — ED Triage Notes (Signed)
Pt arrives via [facility] with c/o AMS. Pt is from [facility] assisted [facility] and is normally A/O x 4. Pt is disoriented x 4 at this time. Per EMS, pt had CBG of 585 with them and has received 500 ml NS. EMS also reports BP 203/68.

## 2019-03-23 DIAGNOSIS — D72829 Elevated white blood cell count, unspecified: Secondary | ICD-10-CM | POA: Diagnosis not present

## 2019-03-23 DIAGNOSIS — R197 Diarrhea, unspecified: Secondary | ICD-10-CM | POA: Diagnosis not present

## 2019-03-23 DIAGNOSIS — D631 Anemia in chronic kidney disease: Secondary | ICD-10-CM | POA: Diagnosis not present

## 2019-03-23 DIAGNOSIS — N183 Chronic kidney disease, stage 3 unspecified: Secondary | ICD-10-CM | POA: Diagnosis not present

## 2019-03-24 ENCOUNTER — Non-Acute Institutional Stay: Payer: Medicare Other | Admitting: Adult Health Nurse Practitioner

## 2019-03-24 ENCOUNTER — Other Ambulatory Visit: Payer: Self-pay

## 2019-03-24 DIAGNOSIS — Z515 Encounter for palliative care: Secondary | ICD-10-CM

## 2019-03-24 NOTE — Progress Notes (Signed)
[place] Consult Note Telephone: [phone]  Fax: [phone]  PATIENT NAME: Justin Leach DOB: 1939/12/09 MRN: [id number]  PRIMARY CARE PROVIDER:  Dr. Durwin Reges [facility]  REFERRING PROVIDER:  Dr. Durwin Reges [facility]  RESPONSIBLE PARTY:  Wife, Justin Leach [id number], Justin Leach [phone]   Due to the COVID-19 crisis, this visit was done via telemedicine and it was initiated and consent by this patient and or family. Video-audio (telehealth) contact was unable to be done due to technical barriers from the patient's side.    RECOMMENDATIONS and PLAN:  1.  Advanced care planning.  Patient is a DNR.  No changes made today  2.  Comorbidities.  No concerns reported today.  Patient is compliant with his medications.  Denies increased SOB or cough, edema, fever, N/V/D, constipation.  Continue current plan of care.  Monitor weights, blood sugars, and VS that would warrant any changes in medications  3. Mobility.  Patient ambulates with a walker.  Requires assistance with bathing and dressing but can go to the bathroom on his own.  Had a fall in June and one in July.  Was evaluated at ER for both.  Was slightly dehydrated in June fall and was given IV fluids and returned to facility.  Continue supportive care and falls precautions.  Staff states that he does have a pendant with a button to push if he does fall.    4.  Anxiety.  Patient is having increased anxiety and is being seen by psych services through [facility]. He had increased agitation and was upset saying that his wife was going to divorce him.  Was evaluated at ED on 10/31 with no acute findings.  Come to find out it was him who was threatening his wife with divorce.  Staff reports that [facility] psych services is in the building today and she will try to make sure he gets seen today.  Continue psych recommendations.  I spent 30 minutes providing this consultation,  from 11:00 to 11:30. More  than 50% of the time in this consultation was spent coordinating communication.   HISTORY OF PRESENT ILLNESS:  Justin Leach is a 85 y.o. year old male with multiple medical problems including diastolic heart failure, CKD stage 3, type 2 diabetes, hyperkalemia,anemia of chronic disease, hyperlipidemia. Palliative Care was asked to help address goals of care.   CODE STATUS: DNR  PPS: 40% HOSPICE ELIGIBILITY/DIAGNOSIS: TBD  PHYSICAL EXAM:   Deferred   PAST MEDICAL HISTORY:  Past Medical History:  Diagnosis Date  . Bradycardia    a. during 02/2015 admission - occasional HR in 40s while being treated for atrial fib.  . Cervical disc disease   . Chronic diastolic CHF (congestive heart failure) ([place])    a. Dx 02/2015 -  2D echo 03/02/15 showed severe focal basal hypertrophy of the septum, EF 55-60%, mild MR, no effusion.  . CKD (chronic kidney disease), stage III   . Coronary artery calcification seen on CT scan    a. Nuc 02/2015 was normal.  . Dyslipidemia   . GERD (gastroesophageal reflux disease)   . Hypertension   . 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 PAF (paroxysmal atrial fibrillation) ([place])    a. Dx 02/2015 - in and out on tele, rx'd Coumadin and amiodarone.  . Pneumonia 2007  . Shortness of breath dyspnea    WITH EXERTION  . Type II diabetes mellitus ([place])  SOCIAL HX:  Social History   Tobacco Use  . Smoking status: Never Smoker  . Smokeless tobacco: Former Systems developer    Types: Chew  Substance Use Topics  . Alcohol use: No    ALLERGIES:  Allergies  Allergen Reactions  . 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 Patient subsequently taken off amiodarone.  . Nifedipine Other (See Comments)    Reaction: made gums swell up. Pt states that he had to have gum surgery after taking.      PERTINENT MEDICATIONS:   Outpatient Encounter Medications as of 03/24/2019  Medication Sig  . acetaminophen (TYLENOL) 325 MG tablet Take 650 mg by mouth every 6 (six) hours as needed for mild pain or moderate pain.   Marland Kitchen aspirin 81 MG chewable tablet Chew 1 tablet (81 mg total) by mouth daily.  Marland Kitchen atorvastatin (LIPITOR) 40 MG tablet Take 40 mg by mouth at bedtime.   . bisacodyl (DULCOLAX) 10 MG suppository Place 1 suppository (10 mg total) rectally daily as needed for moderate constipation.  . carbamide peroxide (DEBROX) 6.5 % OTIC solution Place 5 drops into both ears 2 (two) times daily. (Patient taking differently: Place 5 drops into both ears 2 (two) times daily as needed. )  . citalopram (CELEXA) 20 MG tablet Take 20 mg by mouth daily.  . febuxostat (ULORIC) 40 MG tablet Take 40 mg by mouth daily.  . furosemide (LASIX) 40 MG tablet Take 40 mg by mouth daily.  . hydrALAZINE (APRESOLINE) 25 MG tablet Take 1 tablet (25 mg total) by mouth every 8 (eight) hours.  . insulin lispro (ADMELOG) 100 UNIT/ML injection Inject 8 Units into the skin 3 (three) times daily with meals. Sliding Scale:  000-199: 0u 200-249: 2u 250-299: 4u 300-349: 6u 350-399: 8u 400-449: 10u 450-499: 12u 500+ : contact physician  . isosorbide mononitrate (IMDUR) 60 MG 24 hr tablet Take 1 tablet (60 mg total) by mouth daily.  Marland Kitchen LANTUS SOLOSTAR 100 UNIT/ML Solostar Pen Inject 23 Units into the skin at bedtime.   Marland Kitchen loperamide (IMODIUM) 2 MG capsule Take 2 mg by mouth every 8 (eight) hours as needed for diarrhea or loose stools.  . Metoprolol Tartrate 75 MG TABS Take 75 mg by mouth 2 (two) times daily.   . Multiple Vitamin (MULTIVITAMIN WITH MINERALS) TABS tablet Take 1 tablet by mouth daily.  . nitroGLYCERIN (NITROSTAT) 0.4 MG SL tablet Place 0.4 mg under the tongue every 5 (five) minutes as needed for chest pain (max 3 doses).   Marland Kitchen omeprazole (PRILOSEC) 20 MG capsule Take 20 mg by mouth daily.   . ondansetron (ZOFRAN) 4 MG tablet Take 4 mg by mouth  every 4 (four) hours as needed for nausea or vomiting.  . polyethylene glycol (MIRALAX / GLYCOLAX) packet Take 17 g by mouth daily as needed for mild constipation.   No facility-administered encounter medications on file as of 03/24/2019.      Monnica Saltsman Jenetta Downer, NP

## 2019-04-05 DIAGNOSIS — E1129 Type 2 diabetes mellitus with other diabetic kidney complication: Secondary | ICD-10-CM | POA: Diagnosis not present

## 2019-04-05 DIAGNOSIS — E875 Hyperkalemia: Secondary | ICD-10-CM | POA: Diagnosis not present

## 2019-04-05 DIAGNOSIS — N183 Chronic kidney disease, stage 3 unspecified: Secondary | ICD-10-CM | POA: Diagnosis not present

## 2019-04-05 DIAGNOSIS — D72829 Elevated white blood cell count, unspecified: Secondary | ICD-10-CM | POA: Diagnosis not present

## 2019-04-08 DIAGNOSIS — E873 Alkalosis: Secondary | ICD-10-CM | POA: Diagnosis not present

## 2019-04-08 DIAGNOSIS — F329 Major depressive disorder, single episode, unspecified: Secondary | ICD-10-CM | POA: Diagnosis not present

## 2019-04-08 DIAGNOSIS — E8809 Other disorders of plasma-protein metabolism, not elsewhere classified: Secondary | ICD-10-CM | POA: Diagnosis not present

## 2019-04-08 DIAGNOSIS — D7589 Other specified diseases of blood and blood-forming organs: Secondary | ICD-10-CM | POA: Diagnosis not present

## 2019-04-09 DIAGNOSIS — E873 Alkalosis: Secondary | ICD-10-CM | POA: Diagnosis not present

## 2019-04-09 DIAGNOSIS — E8809 Other disorders of plasma-protein metabolism, not elsewhere classified: Secondary | ICD-10-CM | POA: Diagnosis not present

## 2019-04-09 DIAGNOSIS — D7589 Other specified diseases of blood and blood-forming organs: Secondary | ICD-10-CM | POA: Diagnosis not present

## 2019-04-09 DIAGNOSIS — F329 Major depressive disorder, single episode, unspecified: Secondary | ICD-10-CM | POA: Diagnosis not present

## 2019-04-13 DIAGNOSIS — J309 Allergic rhinitis, unspecified: Secondary | ICD-10-CM | POA: Diagnosis not present

## 2019-04-13 DIAGNOSIS — I129 Hypertensive chronic kidney disease with stage 1 through stage 4 chronic kidney disease, or unspecified chronic kidney disease: Secondary | ICD-10-CM | POA: Diagnosis not present

## 2019-04-13 DIAGNOSIS — E7849 Other hyperlipidemia: Secondary | ICD-10-CM | POA: Diagnosis not present

## 2019-04-13 DIAGNOSIS — F4489 Other dissociative and conversion disorders: Secondary | ICD-10-CM | POA: Diagnosis not present

## 2019-04-19 DIAGNOSIS — K219 Gastro-esophageal reflux disease without esophagitis: Secondary | ICD-10-CM | POA: Diagnosis not present

## 2019-04-19 DIAGNOSIS — I4891 Unspecified atrial fibrillation: Secondary | ICD-10-CM | POA: Diagnosis not present

## 2019-04-19 DIAGNOSIS — K5909 Other constipation: Secondary | ICD-10-CM | POA: Diagnosis not present

## 2019-04-19 DIAGNOSIS — M109 Gout, unspecified: Secondary | ICD-10-CM | POA: Diagnosis not present

## 2019-04-21 DIAGNOSIS — F4489 Other dissociative and conversion disorders: Secondary | ICD-10-CM | POA: Diagnosis not present

## 2019-04-21 DIAGNOSIS — E785 Hyperlipidemia, unspecified: Secondary | ICD-10-CM | POA: Diagnosis not present

## 2019-04-21 DIAGNOSIS — J309 Allergic rhinitis, unspecified: Secondary | ICD-10-CM | POA: Diagnosis not present

## 2019-04-21 DIAGNOSIS — I129 Hypertensive chronic kidney disease with stage 1 through stage 4 chronic kidney disease, or unspecified chronic kidney disease: Secondary | ICD-10-CM | POA: Diagnosis not present

## 2019-04-23 DIAGNOSIS — K219 Gastro-esophageal reflux disease without esophagitis: Secondary | ICD-10-CM | POA: Diagnosis not present

## 2019-04-23 DIAGNOSIS — M109 Gout, unspecified: Secondary | ICD-10-CM | POA: Diagnosis not present

## 2019-04-23 DIAGNOSIS — I4891 Unspecified atrial fibrillation: Secondary | ICD-10-CM | POA: Diagnosis not present

## 2019-04-23 DIAGNOSIS — K5909 Other constipation: Secondary | ICD-10-CM | POA: Diagnosis not present

## 2019-04-27 DIAGNOSIS — R0609 Other forms of dyspnea: Secondary | ICD-10-CM | POA: Diagnosis not present

## 2019-04-27 DIAGNOSIS — R0602 Shortness of breath: Secondary | ICD-10-CM | POA: Diagnosis not present

## 2019-04-27 DIAGNOSIS — J8410 Pulmonary fibrosis, unspecified: Secondary | ICD-10-CM | POA: Diagnosis not present

## 2019-04-27 DIAGNOSIS — R0902 Hypoxemia: Secondary | ICD-10-CM | POA: Diagnosis not present

## 2019-04-27 DIAGNOSIS — R410 Disorientation, unspecified: Secondary | ICD-10-CM | POA: Diagnosis not present

## 2019-04-28 DIAGNOSIS — F4320 Adjustment disorder, unspecified: Secondary | ICD-10-CM | POA: Diagnosis not present

## 2019-04-29 DIAGNOSIS — R4189 Other symptoms and signs involving cognitive functions and awareness: Secondary | ICD-10-CM | POA: Diagnosis not present

## 2019-04-29 DIAGNOSIS — R0902 Hypoxemia: Secondary | ICD-10-CM | POA: Diagnosis not present

## 2019-04-29 DIAGNOSIS — I509 Heart failure, unspecified: Secondary | ICD-10-CM | POA: Diagnosis not present

## 2019-04-29 DIAGNOSIS — F4489 Other dissociative and conversion disorders: Secondary | ICD-10-CM | POA: Diagnosis not present

## 2019-04-30 DIAGNOSIS — I509 Heart failure, unspecified: Secondary | ICD-10-CM | POA: Diagnosis not present

## 2019-04-30 DIAGNOSIS — R41841 Cognitive communication deficit: Secondary | ICD-10-CM | POA: Diagnosis not present

## 2019-04-30 DIAGNOSIS — R0902 Hypoxemia: Secondary | ICD-10-CM | POA: Diagnosis not present

## 2019-04-30 DIAGNOSIS — F4489 Other dissociative and conversion disorders: Secondary | ICD-10-CM | POA: Diagnosis not present

## 2019-05-01 ENCOUNTER — Other Ambulatory Visit: Payer: Self-pay

## 2019-05-01 ENCOUNTER — Inpatient Hospital Stay
Admission: EM | Admit: 2019-05-01 | Discharge: 2019-05-08 | DRG: 194 | Disposition: A | Discharge status: Skilled Nursing Facility | Payer: Medicare Other | Source: Skilled Nursing Facility | Attending: Internal Medicine | Admitting: Internal Medicine

## 2019-05-01 ENCOUNTER — Emergency Department: Payer: Medicare Other

## 2019-05-01 DIAGNOSIS — J189 Pneumonia, unspecified organism: Principal | ICD-10-CM | POA: Diagnosis present

## 2019-05-01 DIAGNOSIS — I130 Hypertensive heart and chronic kidney disease with heart failure and stage 1 through stage 4 chronic kidney disease, or unspecified chronic kidney disease: Secondary | ICD-10-CM | POA: Diagnosis present

## 2019-05-01 DIAGNOSIS — J9602 Acute respiratory failure with hypercapnia: Secondary | ICD-10-CM | POA: Diagnosis not present

## 2019-05-01 DIAGNOSIS — R0602 Shortness of breath: Secondary | ICD-10-CM | POA: Diagnosis not present

## 2019-05-01 DIAGNOSIS — I5032 Chronic diastolic (congestive) heart failure: Secondary | ICD-10-CM

## 2019-05-01 DIAGNOSIS — J690 Pneumonitis due to inhalation of food and vomit: Secondary | ICD-10-CM | POA: Diagnosis not present

## 2019-05-01 DIAGNOSIS — Z888 Allergy status to other drugs, medicaments and biological substances status: Secondary | ICD-10-CM | POA: Diagnosis not present

## 2019-05-01 DIAGNOSIS — R0902 Hypoxemia: Secondary | ICD-10-CM | POA: Diagnosis present

## 2019-05-01 DIAGNOSIS — I480 Paroxysmal atrial fibrillation: Secondary | ICD-10-CM | POA: Diagnosis not present

## 2019-05-01 DIAGNOSIS — Z981 Arthrodesis status: Secondary | ICD-10-CM | POA: Diagnosis not present

## 2019-05-01 DIAGNOSIS — Z7982 Long term (current) use of aspirin: Secondary | ICD-10-CM | POA: Diagnosis not present

## 2019-05-01 DIAGNOSIS — N189 Chronic kidney disease, unspecified: Secondary | ICD-10-CM | POA: Diagnosis not present

## 2019-05-01 DIAGNOSIS — E785 Hyperlipidemia, unspecified: Secondary | ICD-10-CM | POA: Diagnosis not present

## 2019-05-01 DIAGNOSIS — N179 Acute kidney failure, unspecified: Secondary | ICD-10-CM | POA: Diagnosis present

## 2019-05-01 DIAGNOSIS — Z833 Family history of diabetes mellitus: Secondary | ICD-10-CM | POA: Diagnosis not present

## 2019-05-01 DIAGNOSIS — Z96612 Presence of left artificial shoulder joint: Secondary | ICD-10-CM | POA: Diagnosis present

## 2019-05-01 DIAGNOSIS — J9601 Acute respiratory failure with hypoxia: Secondary | ICD-10-CM | POA: Diagnosis not present

## 2019-05-01 DIAGNOSIS — R531 Weakness: Secondary | ICD-10-CM

## 2019-05-01 DIAGNOSIS — Z79899 Other long term (current) drug therapy: Secondary | ICD-10-CM

## 2019-05-01 DIAGNOSIS — E11649 Type 2 diabetes mellitus with hypoglycemia without coma: Secondary | ICD-10-CM | POA: Diagnosis not present

## 2019-05-01 DIAGNOSIS — N1832 Chronic kidney disease, stage 3b: Secondary | ICD-10-CM | POA: Diagnosis not present

## 2019-05-01 DIAGNOSIS — Z794 Long term (current) use of insulin: Secondary | ICD-10-CM

## 2019-05-01 DIAGNOSIS — Z20828 Contact with and (suspected) exposure to other viral communicable diseases: Secondary | ICD-10-CM | POA: Diagnosis present

## 2019-05-01 DIAGNOSIS — E1169 Type 2 diabetes mellitus with other specified complication: Secondary | ICD-10-CM

## 2019-05-01 DIAGNOSIS — E1165 Type 2 diabetes mellitus with hyperglycemia: Secondary | ICD-10-CM | POA: Diagnosis not present

## 2019-05-01 DIAGNOSIS — Z8701 Personal history of pneumonia (recurrent): Secondary | ICD-10-CM | POA: Diagnosis not present

## 2019-05-01 DIAGNOSIS — K219 Gastro-esophageal reflux disease without esophagitis: Secondary | ICD-10-CM | POA: Diagnosis present

## 2019-05-01 DIAGNOSIS — E1122 Type 2 diabetes mellitus with diabetic chronic kidney disease: Secondary | ICD-10-CM | POA: Diagnosis not present

## 2019-05-01 DIAGNOSIS — I440 Atrioventricular block, first degree: Secondary | ICD-10-CM | POA: Diagnosis present

## 2019-05-01 DIAGNOSIS — I10 Essential (primary) hypertension: Secondary | ICD-10-CM | POA: Diagnosis not present

## 2019-05-01 DIAGNOSIS — R4020 Unspecified coma: Secondary | ICD-10-CM | POA: Diagnosis not present

## 2019-05-01 DIAGNOSIS — Z66 Do not resuscitate: Secondary | ICD-10-CM | POA: Diagnosis present

## 2019-05-01 LAB — COMPREHENSIVE METABOLIC PANEL
ALT: 18 U/L (ref 0–44)
AST: 24 U/L (ref 15–41)
Albumin: 3.6 g/dL (ref 3.5–5.0)
Alkaline Phosphatase: 103 U/L (ref 38–126)
Anion gap: 8 (ref 5–15)
BUN: 44 mg/dL — ABNORMAL HIGH (ref 8–23)
CO2: 32 mmol/L (ref 22–32)
Calcium: 9.3 mg/dL (ref 8.9–10.3)
Chloride: 101 mmol/L (ref 98–111)
Creatinine, Ser: 2.33 mg/dL — ABNORMAL HIGH (ref 0.61–1.24)
GFR calc Af Amer: 28 mL/min — ABNORMAL LOW (ref >60)
GFR calc non Af Amer: 25 mL/min — ABNORMAL LOW (ref >60)
Glucose, Bld: 237 mg/dL — ABNORMAL HIGH (ref 70–99)
Potassium: 5 mmol/L (ref 3.5–5.1)
Sodium: 141 mmol/L (ref 135–145)
Total Bilirubin: 0.6 mg/dL (ref 0.3–1.2)
Total Protein: 6.3 g/dL — ABNORMAL LOW (ref 6.5–8.1)

## 2019-05-01 LAB — CBC WITH DIFFERENTIAL/PLATELET
Abs Immature Granulocytes: 0.05 10*3/uL (ref 0.00–0.07)
Basophils Absolute: 0.2 10*3/uL — ABNORMAL HIGH (ref 0.0–0.1)
Basophils Relative: 1 %
Eosinophils Absolute: 0.8 10*3/uL — ABNORMAL HIGH (ref 0.0–0.5)
Eosinophils Relative: 7 %
HCT: 42.1 % (ref 39.0–52.0)
Hemoglobin: 13.6 g/dL (ref 13.0–17.0)
Immature Granulocytes: 0 %
Lymphocytes Relative: 13 %
Lymphs Abs: 1.4 10*3/uL (ref 0.7–4.0)
MCH: 30.6 pg (ref 26.0–34.0)
MCHC: 32.3 g/dL (ref 30.0–36.0)
MCV: 94.6 fL (ref 80.0–100.0)
Monocytes Absolute: 1.3 10*3/uL — ABNORMAL HIGH (ref 0.1–1.0)
Monocytes Relative: 12 %
Neutro Abs: 7.5 10*3/uL (ref 1.7–7.7)
Neutrophils Relative %: 67 %
Platelets: 232 10*3/uL (ref 150–400)
RBC: 4.45 MIL/uL (ref 4.22–5.81)
RDW: 14.4 % (ref 11.5–15.5)
WBC: 11.3 10*3/uL — ABNORMAL HIGH (ref 4.0–10.5)
nRBC: 0 % (ref 0.0–0.2)

## 2019-05-01 LAB — BRAIN NATRIURETIC PEPTIDE: B Natriuretic Peptide: 142 pg/mL — ABNORMAL HIGH (ref 0.0–100.0)

## 2019-05-01 MED ORDER — HEPARIN SODIUM (PORCINE) 5000 UNIT/ML IJ SOLN
5000.0000 [IU] | Freq: Three times a day (TID) | INTRAMUSCULAR | Status: DC
  Administered 2019-05-02 – 2019-05-08 (×21): 5000 [IU] via SUBCUTANEOUS
  Filled 2019-05-01 (×21): qty 1

## 2019-05-01 MED ORDER — METHYLPREDNISOLONE SODIUM SUCC 125 MG IJ SOLR
125.0000 mg | Freq: Once | INTRAMUSCULAR | Status: AC
  Administered 2019-05-01: 125 mg via INTRAVENOUS
  Filled 2019-05-01: qty 2

## 2019-05-01 NOTE — H&P (Signed)
History and Physical    Justin Leach [id number] DOB: 03/18/1932 DOA: 05/01/2019  PCP: Alvester Morin, MD  Patient coming from: nursing home  I have personally briefly reviewed patient's old medical records in [place]  Chief Complaint: shortness of breath  HPI: Justin Leach is a 85 y.o. male with medical history significant of interstitial lung disease not on chronic oxygen, paroxysmal atrial fibrillation, hx of subdural hematoma, chronic diastolic heart failure, CKD stage III, type 2 diabetes, hypertension who presents with concerns of increasing shortness of breath.   Patient is alert and oriented to self and location only and was unable to provide any history.  Reportedly per EMS patient was recently diagnosed with pneumonia and started to have worsening shortness of breath.  He was found to be hypoxic down to about 80% on room air and had improvement on 3 L via nasal cannula.  ED Course: He was afebrile and intermittently hypertensive up to 180s over 50s with oxygen saturation of 100% on 3 L.  CBC shows leukocytosis of 11.3 and no anemia with hemoglobin of 13.6.  CMP showed a glucose of 237, creatinine of 2.33 which is elevated from a month ago at 1.97.  BNP of 142.  Troponin of 21 and then 23. CXR shows no acute pulmonary process.   Review of Systems: Patient denies any frank pain but otherwise cannot provide a full review of systems.  Past Medical History:  Diagnosis Date  . Bradycardia    a. during 02/2015 admission - occasional HR in 40s while being treated for atrial fib.  . Cervical disc disease   . Chronic diastolic CHF (congestive heart failure) ([place])    a. Dx 02/2015 -  2D echo 03/02/15 showed severe focal basal hypertrophy of the septum, EF 55-60%, mild MR, no effusion.  . CKD (chronic kidney disease), stage III   . Coronary artery calcification seen on CT scan    a. Nuc 02/2015 was normal.  . Dyslipidemia   . GERD (gastroesophageal reflux  disease)   . Hypertension   . 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 PAF (paroxysmal atrial fibrillation) ([place])    a. Dx 02/2015 - in and out on tele, rx'd Coumadin and amiodarone.  . Pneumonia 2007  . Shortness of breath dyspnea    WITH EXERTION  . Type II diabetes mellitus ([place])     Past Surgical History:  Procedure Laterality Date  . CATARACT EXTRACTION W/ INTRAOCULAR LENS  IMPLANT, BILATERAL Bilateral   . CERVICAL DISC SURGERY     "i've had 3 neck ORs; not sure what kind; all thru the back of my neck"  . CHOLECYSTECTOMY    . COLONOSCOPY WITH PROPOFOL N/A 01/02/2016   Procedure: COLONOSCOPY WITH PROPOFOL;  Surgeon: Justin Fair, MD;  Location: [facility];  Service: Endoscopy;  Laterality: N/A;  . EYE SURGERY  1930's   "don't know what for" (05/11/2013)  . JOINT REPLACEMENT     bilateral knees, elbows, and shoulders (on 05/11/2013 pt denies all joint replacements"   . KNEE ARTHROPLASTY     "had one scope; one open knee OR; not sure which on which side" (05/10/2013)  . KNEE ARTHROSCOPY     "had one scope; one open knee OR; not sure which on which side" (05/10/2013)  . POSTERIOR FUSION CERVICAL SPINE    . REVERSE SHOULDER ARTHROPLASTY Left 05/19/2018   Procedure: REVERSE SHOULDER ARTHROPLASTY;  Surgeon: Justin December  Saralyn Pilar, MD;  Location: [facility];  Service: Orthopedics;  Laterality: Left;  . ROTATOR CUFF REPAIR Bilateral   . VIDEO BRONCHOSCOPY Bilateral 02/01/2016   Procedure: VIDEO BRONCHOSCOPY WITHOUT FLUORO;  Surgeon: Marshell [name], MD;  Location: [facility];  Service: Cardiopulmonary;  Laterality: Bilateral;     reports that he has never smoked. He has quit using smokeless tobacco.  His smokeless tobacco use included chew. He reports that he does not drink alcohol or use drugs.  Allergies  Allergen Reactions  . Amiodarone Other (See Comments)    MD noted 10/31/15: Chest imaging in January showed new findings concerning for  amiodarone toxicity versus NSIP. Patient subsequently taken off amiodarone.  . Nifedipine Other (See Comments)    Reaction: made gums swell up. Pt states that he had to have gum surgery after taking.     Family History  Problem Relation Age of Onset  . Diabetes Mellitus II Paternal Grandmother   . Heart disease Neg Hx        He does not know his father's history.   . Cancer Neg Hx   . Diabetes Neg Hx   . CAD Neg Hx   . Lung disease Neg Hx   . Rheumatologic disease Neg Hx      Prior to Admission medications   Medication Sig Start Date End Date Taking? Authorizing Provider  acetaminophen (TYLENOL) 325 MG tablet Take 650 mg by mouth every 6 (six) hours as needed for mild pain or moderate pain.  06/11/18   [provider]  aspirin 81 MG chewable tablet Chew 1 tablet (81 mg total) by mouth daily. 07/23/18   Loletha Grayer, MD  atorvastatin (LIPITOR) 40 MG tablet Take 40 mg by mouth at bedtime.  05/29/18   [provider]  bisacodyl (DULCOLAX) 10 MG suppository Place 1 suppository (10 mg total) rectally daily as needed for moderate constipation. 05/27/18   Rai, Ripudeep K, MD  carbamide peroxide (DEBROX) 6.5 % OTIC solution Place 5 drops into both ears 2 (two) times daily. Patient taking differently: Place 5 drops into both ears 2 (two) times daily as needed.  07/23/18   Loletha Grayer, MD  citalopram (CELEXA) 20 MG tablet Take 20 mg by mouth daily.    [provider]  febuxostat (ULORIC) 40 MG tablet Take 40 mg by mouth daily. 06/02/18   [provider]  furosemide (LASIX) 40 MG tablet Take 40 mg by mouth daily. 10/16/18   [provider]  hydrALAZINE (APRESOLINE) 25 MG tablet Take 1 tablet (25 mg total) by mouth every 8 (eight) hours. 05/27/18   Rai, Ripudeep K, MD  insulin lispro (ADMELOG) 100 UNIT/ML injection Inject 8 Units into the skin 3 (three) times daily with meals. Sliding Scale:  000-199: 0u 200-249: 2u 250-299: 4u 300-349: 6u 350-399:  8u 400-449: 10u 450-499: 12u 500+ : contact physician    [provider]  isosorbide mononitrate (IMDUR) 60 MG 24 hr tablet Take 1 tablet (60 mg total) by mouth daily. 07/23/18   Loletha Grayer, MD  LANTUS SOLOSTAR 100 UNIT/ML Solostar Pen Inject 23 Units into the skin at bedtime.  10/26/18   [provider]  loperamide (IMODIUM) 2 MG capsule Take 2 mg by mouth every 8 (eight) hours as needed for diarrhea or loose stools.    [provider]  Metoprolol Tartrate 75 MG TABS Take 75 mg by mouth 2 (two) times daily.     [provider]  Multiple Vitamin (MULTIVITAMIN WITH MINERALS) TABS  tablet Take 1 tablet by mouth daily.    [provider]  nitroGLYCERIN (NITROSTAT) 0.4 MG SL tablet Place 0.4 mg under the tongue every 5 (five) minutes as needed for chest pain (max 3 doses).  07/20/18   [provider]  omeprazole (PRILOSEC) 20 MG capsule Take 20 mg by mouth daily.  02/26/13   [provider]  ondansetron (ZOFRAN) 4 MG tablet Take 4 mg by mouth every 4 (four) hours as needed for nausea or vomiting.    [provider]  polyethylene glycol (MIRALAX / GLYCOLAX) packet Take 17 g by mouth daily as needed for mild constipation. 05/27/18   Mendel Corning, MD    Physical Exam: Vitals:   05/01/19 2200 05/01/19 2230 05/01/19 2245 05/01/19 2300  BP: (!) 158/58 (!) 164/56  (!) 140/56  Pulse: 60 (!) 59 (!) 58   Resp:  19 18 18   Temp:      TempSrc:      SpO2: 100% 100% 100%   Weight:      Height:        Constitutional: NAD, calm, comfortable, non-toxic appearing male laying flat in bed Vitals:   05/01/19 2200 05/01/19 2230 05/01/19 2245 05/01/19 2300  BP: (!) 158/58 (!) 164/56  (!) 140/56  Pulse: 60 (!) 59 (!) 58   Resp:  19 18 18   Temp:      TempSrc:      SpO2: 100% 100% 100%   Weight:      Height:       Eyes: lids and conjunctivae normal ENMT: Mucous membranes are moist.  Neck: normal, supple Respiratory: crackles at right  lower lung base, no wheezing. Normal respiratory effort on 3L with oxygen saturation of 100%. No accessory muscle use.  Cardiovascular: bradycardia and irregularly irregular rhythm, no murmurs / rubs / gallops. No extremity edema.  Abdomen: no tenderness,  Bowel sounds positive.  Musculoskeletal: no clubbing / cyanosis. No joint deformity upper and lower extremities.  Normal muscle tone.  Skin: no rashes, lesions, ulcers. No induration Neurologic: CN 2-12 grossly intact. Sensation intac. Strength 2/5 in lower extremities and strong hand grip strength.  Psychiatric: Normal judgment and insight. Alert and oriented x 3. Normal mood.     Labs on Admission: I have personally reviewed following labs and imaging studies  CBC: Recent Labs  Lab 05/01/19 2049  WBC 11.3*  NEUTROABS 7.5  HGB 13.6  HCT 42.1  MCV 94.6  PLT [id number]   Basic Metabolic Panel: Recent Labs  Lab 05/01/19 2049  NA 141  K 5.0  CL 101  CO2 32  GLUCOSE 237*  BUN 44*  CREATININE 2.33*  CALCIUM 9.3   GFR: Estimated Creatinine Clearance: 22.9 mL/min (A) (by C-G formula based on SCr of 2.33 mg/dL (H)). Liver Function Tests: Recent Labs  Lab 05/01/19 2049  AST 24  ALT 18  ALKPHOS 103  BILITOT 0.6  PROT 6.3*  ALBUMIN 3.6   No results for input(s): LIPASE, AMYLASE in the last 168 hours. No results for input(s): AMMONIA in the last 168 hours. Coagulation Profile: No results for input(s): INR, PROTIME in the last 168 hours. Cardiac Enzymes: No results for input(s): CKTOTAL, CKMB, CKMBINDEX, TROPONINI in the last 168 hours. BNP (last 3 results) No results for input(s): PROBNP in the last 8760 hours. HbA1C: No results for input(s): HGBA1C in the last 72 hours. CBG: No results for input(s): GLUCAP in the last 168 hours. Lipid Profile: No results for input(s): CHOL, HDL,  LDLCALC, TRIG, CHOLHDL, LDLDIRECT in the last 72 hours. Thyroid Function Tests: No results for input(s): TSH, T4TOTAL, FREET4, T3FREE,  THYROIDAB in the last 72 hours. Anemia Panel: No results for input(s): VITAMINB12, FOLATE, FERRITIN, TIBC, IRON, RETICCTPCT in the last 72 hours. Urine analysis:    Component Value Date/Time   COLORURINE YELLOW (A) 03/20/2019 1951   APPEARANCEUR CLEAR (A) 03/20/2019 1951   LABSPEC 1.012 03/20/2019 1951   PHURINE 6.0 03/20/2019 1951   GLUCOSEU >=500 (A) 03/20/2019 [place] NEGATIVE 03/20/2019 [place] NEGATIVE 03/20/2019 [place] NEGATIVE 03/20/2019 1951   PROTEINUR 30 (A) 03/20/2019 1951   UROBILINOGEN 0.2 05/11/2013 1632   NITRITE NEGATIVE 03/20/2019 1951   LEUKOCYTESUR NEGATIVE 03/20/2019 1951    Radiological Exams on Admission: DG Chest Port 1 View  Result Date: 05/01/2019 CLINICAL DATA:  Shortness of breath EXAM: PORTABLE CHEST 1 VIEW COMPARISON:  03/20/2019 FINDINGS: Stable cardiomediastinal contours. Mild linear atelectasis in the left lung base. No focal airspace consolidation, pleural effusion, or pneumothorax. Reverse left shoulder arthroplasty. IMPRESSION: Mild linear atelectasis in the left lung base. Electronically Signed   By: Davina Poke M.D.   On: 05/01/2019 22:08    EKG: Independently reviewed.   Assessment/Plan Acute hypoxia secondary to questionable pneumonia vs worsening ILD - tx presumptively for pneumonia.  Start IV Rocephin and azithromycin - Obtain PCT - If no clinical improvement- consider CT chest   Paroxysmal atrial fibrillation - off Coumadin since hx of subdural hematoma  - continue metoprolol  Weakness of LE - PT  Chronic diastolic heart failure - have mild lower extremity edema but no significant fluid overload - continue Lasix, Imdur  Acute on chronic kidney disease stage III - creatinine of 2.33 here - give 500cc bolus - avoid nephrotoxic agent  Type 2 diabetes - normally on 23 units of Lantus at home - continue 20units qHS and sensitive sliding scale here  Hypertension - continue hydralazine    Hyperlipidemia -continue statin  DVT prophylaxis: Heparin Code Status:DNR- per documentation on recent palliative care note on 03/24/2019 Family Communication: Plan discussed with patient at bedside  disposition Plan: Home with at least 2 midnight stays  Consults called:  Admission status: inpatient   Clarice Bonaventure T Emanuell Morina DO [facility]   If 7PM-7AM, please contact night-coverage [email or web address] Password [facility]  05/01/2019, 11:58 PM

## 2019-05-01 NOTE — ED Triage Notes (Signed)
Patient to triage via EMS from local nursing home.  Per EMS patient recently diagnosed with pneumonia and having shortness of breath  EMS also reports patient with strong urine smell.  When asked how long he has been short of breath reports I don't know.

## 2019-05-01 NOTE — ED Provider Notes (Signed)
[facility] Emergency Department Provider Note  ____________________________________________   I have reviewed the triage vital signs and the nursing notes.   HISTORY  Chief Complaint Shortness of Breath   History limited by and level 5 caveat due to: Poor historian   HPI Justin Leach is a 85 y.o. male who presents to the emergency department today via EMS from living facility because of concern for increasing shortness of breath. The patient however states he is not sure why he is in the emergency department. He denies any shortness of breath or chest pain. Denies any fevers.    Records reviewed. Per medical record review patient is DNR.   Past Medical History:  Diagnosis Date  . Bradycardia    a. during 02/2015 admission - occasional HR in 40s while being treated for atrial fib.  . Cervical disc disease   . Chronic diastolic CHF (congestive heart failure) ([place])    a. Dx 02/2015 -  2D echo 03/02/15 showed severe focal basal hypertrophy of the septum, EF 55-60%, mild MR, no effusion.  . CKD (chronic kidney disease), stage III   . Coronary artery calcification seen on CT scan    a. Nuc 02/2015 was normal.  . Dyslipidemia   . GERD (gastroesophageal reflux disease)   . Hypertension   . 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 PAF (paroxysmal atrial fibrillation) ([place])    a. Dx 02/2015 - in and out on tele, rx'd Coumadin and amiodarone.  . Pneumonia 2007  . Shortness of breath dyspnea    WITH EXERTION  . Type II diabetes mellitus [facility])     Patient Active Problem List   Diagnosis Date Noted  . Interstitial pulmonary disease ([place]) 07/29/2018  . Pneumonia 07/20/2018  . Palliative care encounter 06/26/2018  . Weakness generalized 06/26/2018  . HCAP (healthcare-associated pneumonia) 06/22/2018  . Gouty arthritis of right great toe 06/05/2018  . Hypertensive heart and kidney disease with chronic diastolic  congestive heart failure and stage 3 chronic kidney disease ([place]) 06/01/2018  . Dyslipidemia associated with type 2 diabetes mellitus ([place]) 06/01/2018  . Chronic constipation 06/01/2018  . CKD stage 3 due to type 2 diabetes mellitus ([place]) 06/01/2018  . Volume overload 05/20/2018  . Fall 05/13/2018  . SDH (subdural hematoma) ([place]) 05/13/2018  . Closed comminuted left humeral fracture 05/13/2018  . Hyperkalemia 05/13/2018  . Leukocytosis 05/13/2018  . Fall at home   . Subdural hematoma without coma ([place])   . Cough   . GERD (gastroesophageal reflux disease)   . Pulmonary fibrosis ([place])   . Coronary artery calcification seen on CT scan   . Chronic diastolic CHF (congestive heart failure) ([place])   . PAF (paroxysmal atrial fibrillation) ([place])   . CKD (chronic kidney disease), stage III ([place])   . Type II diabetes mellitus with renal manifestations ([place])   . Essential hypertension   . Chest pain 02/28/2015  . Renal failure (ARF), acute on chronic ([place]) 02/28/2015  . Hyperlipidemia 02/28/2015  . Dyspnea 03/29/2014  . DOE (dyspnea on exertion) 03/29/2014  . Bradycardia 03/29/2014  . Bronchospasm 05/15/2013    Past Surgical History:  Procedure Laterality Date  . CATARACT EXTRACTION W/ INTRAOCULAR LENS  IMPLANT, BILATERAL Bilateral   . CERVICAL DISC SURGERY     "i've had 3 neck ORs; not sure what kind; all thru the back of my neck"  . CHOLECYSTECTOMY    . COLONOSCOPY WITH PROPOFOL N/A  01/02/2016   Procedure: COLONOSCOPY WITH PROPOFOL;  Surgeon: [name], MD;  Location: [facility];  Service: Endoscopy;  Laterality: N/A;  . EYE SURGERY  1930's   "don't know what for" (05/11/2013)  . JOINT REPLACEMENT     bilateral knees, elbows, and shoulders (on 05/11/2013 pt denies all joint replacements"   . KNEE ARTHROPLASTY     "had one scope; one open knee OR; not sure which on which side" (05/10/2013)  . KNEE ARTHROSCOPY     "had one scope; one open knee OR; not sure which on which side"  (05/10/2013)  . POSTERIOR FUSION CERVICAL SPINE    . REVERSE SHOULDER ARTHROPLASTY Left 05/19/2018   Procedure: REVERSE SHOULDER ARTHROPLASTY;  Surgeon: [name], MD;  Location: [place];  Service: Orthopedics;  Laterality: Left;  . ROTATOR CUFF REPAIR Bilateral   . VIDEO BRONCHOSCOPY Bilateral 02/01/2016   Procedure: VIDEO BRONCHOSCOPY WITHOUT FLUORO;  Surgeon: Marshell [name], MD;  Location: [facility];  Service: Cardiopulmonary;  Laterality: Bilateral;    Prior to Admission medications   Medication Sig Start Date End Date Taking? Authorizing Provider  acetaminophen (TYLENOL) 325 MG tablet Take 650 mg by mouth every 6 (six) hours as needed for mild pain or moderate pain.  06/11/18   [provider]  aspirin 81 MG chewable tablet Chew 1 tablet (81 mg total) by mouth daily. 07/23/18   Loletha Grayer, MD  atorvastatin (LIPITOR) 40 MG tablet Take 40 mg by mouth at bedtime.  05/29/18   [provider]  bisacodyl (DULCOLAX) 10 MG suppository Place 1 suppository (10 mg total) rectally daily as needed for moderate constipation. 05/27/18   Rai, Ripudeep K, MD  carbamide peroxide (DEBROX) 6.5 % OTIC solution Place 5 drops into both ears 2 (two) times daily. Patient taking differently: Place 5 drops into both ears 2 (two) times daily as needed.  07/23/18   Loletha Grayer, MD  citalopram (CELEXA) 20 MG tablet Take 20 mg by mouth daily.    [provider]  febuxostat (ULORIC) 40 MG tablet Take 40 mg by mouth daily. 06/02/18   [provider]  furosemide (LASIX) 40 MG tablet Take 40 mg by mouth daily. 10/16/18   [provider]  hydrALAZINE (APRESOLINE) 25 MG tablet Take 1 tablet (25 mg total) by mouth every 8 (eight) hours. 05/27/18   Rai, Ripudeep K, MD  insulin lispro (ADMELOG) 100 UNIT/ML injection Inject 8 Units into the skin 3 (three) times daily with meals. Sliding Scale:  000-199: 0u 200-249: 2u 250-299: 4u 300-349: 6u 350-399: 8u 400-449:  10u 450-499: 12u 500+ : contact physician    [provider]  isosorbide mononitrate (IMDUR) 60 MG 24 hr tablet Take 1 tablet (60 mg total) by mouth daily. 07/23/18   Loletha Grayer, MD  LANTUS SOLOSTAR 100 UNIT/ML Solostar Pen Inject 23 Units into the skin at bedtime.  10/26/18   [provider]  loperamide (IMODIUM) 2 MG capsule Take 2 mg by mouth every 8 (eight) hours as needed for diarrhea or loose stools.    [provider]  Metoprolol Tartrate 75 MG TABS Take 75 mg by mouth 2 (two) times daily.     [provider]  Multiple Vitamin (MULTIVITAMIN WITH MINERALS) TABS tablet Take 1 tablet by mouth daily.    [provider]  nitroGLYCERIN (NITROSTAT) 0.4 MG SL tablet Place 0.4 mg under the tongue every 5 (five) minutes as needed for chest pain (max 3 doses).  07/20/18  [provider]  omeprazole (PRILOSEC) 20 MG capsule Take 20 mg by mouth daily.  02/26/13   [provider]  ondansetron (ZOFRAN) 4 MG tablet Take 4 mg by mouth every 4 (four) hours as needed for nausea or vomiting.    [provider]  polyethylene glycol (MIRALAX / GLYCOLAX) packet Take 17 g by mouth daily as needed for mild constipation. 05/27/18   Rai, Vernelle Emerald, MD    Allergies Amiodarone and Nifedipine  Family History  Problem Relation Age of Onset  . Diabetes Mellitus II Paternal Grandmother   . Heart disease Neg Hx        He does not know his father's history.   . Cancer Neg Hx   . Diabetes Neg Hx   . CAD Neg Hx   . Lung disease Neg Hx   . Rheumatologic disease Neg Hx     Social History Social History   Tobacco Use  . Smoking status: Never Smoker  . Smokeless tobacco: Former Systems developer    Types: Chew  Substance Use Topics  . Alcohol use: No  . Drug use: No    Comment: used chew when I was a teenager per patient     Review of Systems Constitutional: No fever/chills Eyes: No visual changes. ENT: No sore throat. Cardiovascular: Denies  chest pain. Respiratory: Denies shortness of breath. Gastrointestinal: No abdominal pain.  No nausea, no vomiting.  No diarrhea.   Genitourinary: Negative for dysuria. Musculoskeletal: Negative for back pain. Skin: Negative for rash. Neurological: Negative for headaches, focal weakness or numbness.  ____________________________________________   PHYSICAL EXAM:  VITAL SIGNS: ED Triage Vitals  Enc Vitals Group     BP 05/01/19 2025 (!) 180/53     Pulse Rate 05/01/19 2025 62     Resp 05/01/19 2025 (!) 22     Temp 05/01/19 2025 98.8 F (37.1 C)     Temp Source 05/01/19 2025 Oral     SpO2 05/01/19 2025 94 %     Weight 05/01/19 2026 175 lb (79.4 kg)     Height 05/01/19 2026 5\' 6"  (1.676 m)     Head Circumference --      Peak Flow --      Pain Score 05/01/19 2025 0   Constitutional: Alert and oriented.  Eyes: Conjunctivae are normal.  ENT      Head: Normocephalic and atraumatic.      Nose: No congestion/rhinnorhea.      Mouth/Throat: Mucous membranes are moist.      Neck: No stridor. Hematological/Lymphatic/Immunilogical: No cervical lymphadenopathy. Cardiovascular: Normal rate, regular rhythm.  No murmurs, rubs, or gallops.  Respiratory: Normal respiratory effort without tachypnea nor retractions. Breath sounds are clear and equal bilaterally. No wheezes/rales/rhonchi. Gastrointestinal: Soft and non tender. No rebound. No guarding.  Genitourinary: Deferred Musculoskeletal: Normal range of motion in all extremities. No lower extremity edema. Neurologic:  Normal speech and language. No gross focal neurologic deficits are appreciated.  Skin:  Skin is warm, dry and intact. No rash noted. Psychiatric: Mood and affect are normal. Speech and behavior are normal. Patient exhibits appropriate insight and judgment.  ____________________________________________    LABS (pertinent positives/negatives)  CBC wbc 11.3, hgb 13.6, plt 232 CMP wnl except glu 237, bun 44, cr 2.33, t pro  6.3 BNP 142.0  ____________________________________________   EKG  I, Nance Pear, attending physician, personally viewed and interpreted this EKG  EKG Time: 2045 Rate: 62 Rhythm: sinus rhythm with 1st degree av block Axis: left axis  deviation Intervals: qtc 448 QRS: narrow, q waves v1 ST changes: no st elevation Impression: abnormal ekg  ____________________________________________    RADIOLOGY  CXR Linear atelectasis in left lung base  ____________________________________________   PROCEDURES  Procedures  ____________________________________________   INITIAL IMPRESSION / ASSESSMENT AND PLAN / ED COURSE  Pertinent labs & imaging results that were available during my care of the patient were reviewed by me and considered in my medical decision making (see chart for details).   Patient presented to the ED because of concern for shortness of breath. Patient was found to be hypoxic on room air. Denies any chest pain. Hypoxia did improve after supplemental oxygen. Patient is coming from a nursing facility. X-ray did not show any obvious pneumonia. Patient did have some slight swelling in his lower extremities. Given hypoxia will plan on admission.  ____________________________________________   FINAL CLINICAL IMPRESSION(S) / ED DIAGNOSES  Final diagnoses:  Hypoxia     Note: This dictation was prepared with Dragon dictation. Any transcriptional errors that result from this process are unintentional     Nance Pear, MD 05/01/19 2332

## 2019-05-02 LAB — BASIC METABOLIC PANEL
Anion gap: 8 (ref 5–15)
BUN: 43 mg/dL — ABNORMAL HIGH (ref 8–23)
CO2: 29 mmol/L (ref 22–32)
Calcium: 8.9 mg/dL (ref 8.9–10.3)
Chloride: 102 mmol/L (ref 98–111)
Creatinine, Ser: 1.95 mg/dL — ABNORMAL HIGH (ref 0.61–1.24)
GFR calc Af Amer: 35 mL/min — ABNORMAL LOW (ref >60)
GFR calc non Af Amer: 30 mL/min — ABNORMAL LOW (ref >60)
Glucose, Bld: 243 mg/dL — ABNORMAL HIGH (ref 70–99)
Potassium: 5.1 mmol/L (ref 3.5–5.1)
Sodium: 139 mmol/L (ref 135–145)

## 2019-05-02 LAB — CBC
HCT: 43 % (ref 39.0–52.0)
Hemoglobin: 13.4 g/dL (ref 13.0–17.0)
MCH: 30.7 pg (ref 26.0–34.0)
MCHC: 31.2 g/dL (ref 30.0–36.0)
MCV: 98.4 fL (ref 80.0–100.0)
Platelets: 188 10*3/uL (ref 150–400)
RBC: 4.37 MIL/uL (ref 4.22–5.81)
RDW: 14.2 % (ref 11.5–15.5)
WBC: 9 10*3/uL (ref 4.0–10.5)
nRBC: 0 % (ref 0.0–0.2)

## 2019-05-02 LAB — GLUCOSE, CAPILLARY
Glucose-Capillary: 263 mg/dL — ABNORMAL HIGH (ref 70–99)
Glucose-Capillary: 379 mg/dL — ABNORMAL HIGH (ref 70–99)
Glucose-Capillary: 250 mg/dL — ABNORMAL HIGH (ref 70–99)
Glucose-Capillary: 219 mg/dL — ABNORMAL HIGH (ref 70–99)

## 2019-05-02 LAB — PROCALCITONIN: Procalcitonin: <0.1 ng/mL

## 2019-05-02 LAB — HEMOGLOBIN A1C
Hgb A1c MFr Bld: 7.2 % — ABNORMAL HIGH (ref 4.8–5.6)
Mean Plasma Glucose: 159.94 mg/dL

## 2019-05-02 LAB — POC SARS CORONAVIRUS 2 AG: SARS Coronavirus 2 Ag: NEGATIVE

## 2019-05-02 MED ORDER — INSULIN GLARGINE 100 UNIT/ML ~~LOC~~ SOLN
20.0000 [IU] | Freq: Every day | SUBCUTANEOUS | Status: DC
  Administered 2019-05-02 (×2): 20 [IU] via SUBCUTANEOUS
  Filled 2019-05-02 (×3): qty 0.2

## 2019-05-02 MED ORDER — CITALOPRAM HYDROBROMIDE 20 MG PO TABS
20.0000 mg | ORAL_TABLET | Freq: Every day | ORAL | Status: DC
  Administered 2019-05-02 – 2019-05-08 (×7): 20 mg via ORAL
  Filled 2019-05-02 (×7): qty 1

## 2019-05-02 MED ORDER — INSULIN ASPART 100 UNIT/ML ~~LOC~~ SOLN
0.0000 [IU] | Freq: Three times a day (TID) | SUBCUTANEOUS | Status: DC
  Administered 2019-05-02: 9 [IU] via SUBCUTANEOUS
  Administered 2019-05-02: 3 [IU] via SUBCUTANEOUS
  Administered 2019-05-03 (×2): 5 [IU] via SUBCUTANEOUS
  Administered 2019-05-03: 9 [IU] via SUBCUTANEOUS
  Administered 2019-05-04 (×2): 2 [IU] via SUBCUTANEOUS
  Administered 2019-05-04: 3 [IU] via SUBCUTANEOUS
  Administered 2019-05-05 (×2): 2 [IU] via SUBCUTANEOUS
  Administered 2019-05-05: 5 [IU] via SUBCUTANEOUS
  Administered 2019-05-06: 1 [IU] via SUBCUTANEOUS
  Administered 2019-05-06: 3 [IU] via SUBCUTANEOUS
  Administered 2019-05-07: 2 [IU] via SUBCUTANEOUS
  Administered 2019-05-08: 5 [IU] via SUBCUTANEOUS
  Administered 2019-05-08: 2 [IU] via SUBCUTANEOUS
  Administered 2019-05-08: 3 [IU] via SUBCUTANEOUS
  Filled 2019-05-02 (×17): qty 1

## 2019-05-02 MED ORDER — ATORVASTATIN CALCIUM 20 MG PO TABS
40.0000 mg | ORAL_TABLET | Freq: Every day | ORAL | Status: DC
  Administered 2019-05-02 – 2019-05-08 (×7): 40 mg via ORAL
  Filled 2019-05-02 (×7): qty 2

## 2019-05-02 MED ORDER — HYDRALAZINE HCL 25 MG PO TABS
25.0000 mg | ORAL_TABLET | Freq: Three times a day (TID) | ORAL | Status: DC
  Administered 2019-05-02 – 2019-05-04 (×6): 25 mg via ORAL
  Filled 2019-05-02 (×7): qty 1

## 2019-05-02 MED ORDER — METHYLPREDNISOLONE SODIUM SUCC 40 MG IJ SOLR
40.0000 mg | Freq: Four times a day (QID) | INTRAMUSCULAR | Status: DC
  Administered 2019-05-02 – 2019-05-03 (×3): 40 mg via INTRAVENOUS
  Filled 2019-05-02 (×3): qty 1

## 2019-05-02 MED ORDER — SODIUM CHLORIDE 0.9 % IV BOLUS
500.0000 mL | Freq: Once | INTRAVENOUS | Status: AC
  Administered 2019-05-02: 500 mL via INTRAVENOUS

## 2019-05-02 MED ORDER — FEBUXOSTAT 40 MG PO TABS
40.0000 mg | ORAL_TABLET | Freq: Every day | ORAL | Status: DC
  Administered 2019-05-02 – 2019-05-08 (×7): 40 mg via ORAL
  Filled 2019-05-02 (×7): qty 1

## 2019-05-02 MED ORDER — PANTOPRAZOLE SODIUM 40 MG PO TBEC
40.0000 mg | DELAYED_RELEASE_TABLET | Freq: Every day | ORAL | Status: DC
  Administered 2019-05-02 – 2019-05-08 (×7): 40 mg via ORAL
  Filled 2019-05-02 (×7): qty 1

## 2019-05-02 MED ORDER — METOPROLOL TARTRATE 25 MG PO TABS
75.0000 mg | ORAL_TABLET | Freq: Two times a day (BID) | ORAL | Status: DC
  Administered 2019-05-02 – 2019-05-05 (×6): 75 mg via ORAL
  Filled 2019-05-02 (×8): qty 3

## 2019-05-02 MED ORDER — SODIUM CHLORIDE 0.9 % IV SOLN
1.0000 g | INTRAVENOUS | Status: AC
  Administered 2019-05-02 – 2019-05-05 (×5): 1 g via INTRAVENOUS
  Filled 2019-05-02: qty 10
  Filled 2019-05-02 (×4): qty 1

## 2019-05-02 MED ORDER — ISOSORBIDE MONONITRATE ER 60 MG PO TB24
60.0000 mg | ORAL_TABLET | Freq: Every day | ORAL | Status: DC
  Administered 2019-05-02 – 2019-05-08 (×7): 60 mg via ORAL
  Filled 2019-05-02 (×7): qty 1

## 2019-05-02 MED ORDER — SODIUM CHLORIDE 0.9 % IV SOLN
500.0000 mg | INTRAVENOUS | Status: AC
  Administered 2019-05-02 – 2019-05-06 (×5): 500 mg via INTRAVENOUS
  Filled 2019-05-02 (×6): qty 500

## 2019-05-02 MED ORDER — ASPIRIN 81 MG PO CHEW
81.0000 mg | CHEWABLE_TABLET | Freq: Every day | ORAL | Status: DC
  Administered 2019-05-02 – 2019-05-08 (×7): 81 mg via ORAL
  Filled 2019-05-02 (×7): qty 1

## 2019-05-02 MED ORDER — FUROSEMIDE 40 MG PO TABS
40.0000 mg | ORAL_TABLET | Freq: Every day | ORAL | Status: DC
  Administered 2019-05-02 – 2019-05-05 (×4): 40 mg via ORAL
  Filled 2019-05-02 (×4): qty 1

## 2019-05-02 NOTE — Evaluation (Signed)
Physical Therapy Evaluation Patient Details Name: Justin Leach MRN: [id number] DOB: 01/18/1932 Today's Date: 05/02/2019   History of Present Illness  From MD note: Pt is an 85 y.o. male with medical history significant of interstitial lung disease not on chronic oxygen, paroxysmal atrial fibrillation, hx of subdural hematoma, chronic diastolic heart failure, CKD stage III, type 2 diabetes, hypertension who presents with concerns of increasing shortness of breath.  MD assessment includes: Acute hypoxia secondary to questionable pneumonia vs worsening ILD, A-fib, weakness, Chronic diastolic heart failure, acute on chronic kidney disease stage III, DM II, HTN, and HLD.    Clinical Impression  Pt presented with deficits in strength, transfers, mobility, gait, balance, and activity tolerance.  Pt unable to provide any history but per chart review from recent prior admission pt was able to amb short distances in a facility with a RW.  During the session the pt required extensive physical assistance with all functional tasks and presented with heavy posterior lean during attempts at ambulation with a RW.  Pt is at a very high risk for falls and would benefit from PT services in a SNF setting upon discharge to safely address above deficits for decreased caregiver assistance and eventual return to PLOF.      Follow Up Recommendations SNF    Equipment Recommendations  None recommended by PT    Recommendations for Other Services       Precautions / Restrictions Precautions Precautions: Fall Restrictions Weight Bearing Restrictions: No      Mobility  Bed Mobility Overal bed mobility: Needs Assistance Bed Mobility: Supine to Sit;Sit to Supine     Supine to sit: Max assist Sit to supine: Max assist   General bed mobility comments: Max A for BLEs and trunk control  Transfers Overall transfer level: Needs assistance Equipment used: Rolling walker (2 wheeled) Transfers: Sit to/from  Stand Sit to Stand: Mod assist;From elevated surface         General transfer comment: Mod A to stand and to prevent posterior LOB once in standing  Ambulation/Gait Ambulation/Gait assistance: Mod assist Gait Distance (Feet): 1 Feet Assistive device: Rolling walker (2 wheeled) Gait Pattern/deviations: Step-to pattern;Leaning posteriorly Gait velocity: decreased   General Gait Details: Pt able to take 1-2 very small, shuffling steps at the EOB with Mod A to prevent posterior LOB before requiring to return to sitting  Stairs            Wheelchair Mobility    Modified Rankin (Stroke Patients Only)       Balance Overall balance assessment: Needs assistance   Sitting balance-Leahy Scale: Fair     Standing balance support: Bilateral upper extremity supported Standing balance-Leahy Scale: Poor Standing balance comment: Posterior instability                             Pertinent Vitals/Pain Pain Assessment: No/denies pain    Home Living Family/patient expects to be discharged to:: Skilled nursing facility                 Additional Comments: Pt unable to provide history, information taken from chart review    Prior Function Level of Independence: Needs assistance   Gait / Transfers Assistance Needed: Pt able to amb short distances with a RW  ADL's / Homemaking Assistance Needed: Assist with ADLs from staff        Hand Dominance        Extremity/Trunk Assessment  Upper Extremity Assessment Upper Extremity Assessment: Generalized weakness    Lower Extremity Assessment Lower Extremity Assessment: Generalized weakness       Communication   Communication: HOH  Cognition Arousal/Alertness: Awake/alert Behavior During Therapy: WFL for tasks assessed/performed Overall Cognitive Status: No family/caregiver present to determine baseline cognitive functioning                                 General Comments: Pt alert to  self only, unable to provide history.  Pt able to follow 1-step commands consistently      General Comments      Exercises Total Joint Exercises Ankle Circles/Pumps: AROM;Both;10 reps Quad Sets: Strengthening;Both Heel Slides: AAROM;Both;10 reps Hip ABduction/ADduction: AAROM;Both;10 reps Straight Leg Raises: AAROM;Both;10 reps Long Arc Quad: AROM;Both;10 reps Knee Flexion: AROM;Both;10 reps   Assessment/Plan    PT Assessment Patient needs continued PT services  PT Problem List Decreased strength;Decreased activity tolerance;Decreased balance;Decreased mobility;Decreased knowledge of use of DME       PT Treatment Interventions DME instruction;Gait training;Therapeutic activities;Functional mobility training;Therapeutic exercise;Balance training;Patient/family education    PT Goals (Current goals can be found in the Care Plan section)  Acute Rehab PT Goals PT Goal Formulation: Patient unable to participate in goal setting Time For Goal Achievement: 05/15/19 Potential to Achieve Goals: Fair    Frequency Min 2X/week   Barriers to discharge        Co-evaluation               AM-PAC PT "6 Clicks" Mobility  Outcome Measure Help needed turning from your back to your side while in a flat bed without using bedrails?: A Lot Help needed moving from lying on your back to sitting on the side of a flat bed without using bedrails?: A Lot Help needed moving to and from a bed to a chair (including a wheelchair)?: A Lot Help needed standing up from a chair using your arms (e.g., wheelchair or bedside chair)?: A Lot Help needed to walk in hospital room?: Total Help needed climbing 3-5 steps with a railing? : Total 6 Click Score: 10    End of Session Equipment Utilized During Treatment: Gait belt Activity Tolerance: Patient tolerated treatment well Patient left: in bed;with call bell/phone within reach;with bed alarm set Nurse Communication: Mobility status PT Visit Diagnosis:  Unsteadiness on feet (R26.81);Muscle weakness (generalized) (M62.81);Difficulty in walking, not elsewhere classified (R26.2)    Time: [id number] PT Time Calculation (min) (ACUTE ONLY): 34 min   Charges:   PT Evaluation $PT Eval Moderate Complexity: 1 Mod PT Treatments $Therapeutic Exercise: 8-22 mins        D. Royetta Asal PT, DPT 05/02/19, 4:21 PM

## 2019-05-02 NOTE — Progress Notes (Signed)
PROGRESS NOTE    Justin CLINKSCALE  [id number] DOB: Feb 23, 1932 DOA: 05/01/2019 PCP: Alvester Morin, MD    Brief Narrative:  Justin Leach is a 85 y.o. male with medical history significant of interstitial lung disease not on chronic oxygen, paroxysmal atrial fibrillation, hx of subdural hematoma, chronic diastolic heart failure, CKD stage III, type 2 diabetes, hypertension who presents with concerns of increasing shortness of breath.   Patient is alert and oriented to self and location only and was unable to provide any history.  Reportedly per EMS patient was recently diagnosed with pneumonia and started to have worsening shortness of breath.  He was found to be hypoxic down to about 80% on room air and had improvement on 3 L via nasal cannula. CXR shows no acute pulmonary process.      Consultants:   None  Procedures: CT  Antimicrobials:   Ceftriaxone azithromycin   Subjective: Patient reports his shortness of breath is the same and he has not worsened.  He does not think is better with either eat.  No chest pain, nausea, fever or chills  Objective: Vitals:   05/02/19 0300 05/02/19 0400 05/02/19 0616 05/02/19 0654  BP: (!) 164/71 (!) 177/64 (!) 174/58 (!) 199/69  Pulse: 61 61 (!) 59 60  Resp: 16 15 16 18   Temp:    98.2 F (36.8 C)  TempSrc:    Oral  SpO2: 98% 95% 100% 100%  Weight:      Height:        Intake/Output Summary (Last 24 hours) at 05/02/2019 1221 Last data filed at 05/02/2019 0330 Gross per 24 hour  Intake 850 ml  Output --  Net 850 ml   Filed Weights   05/01/19 2026  Weight: 79.4 kg    Examination:  General exam: Appears calm and comfortable , on oxygen. Cooperative with exam when awakens Respiratory system:RLL crackles. No wheezing or rhonchi  Cardiovascular system: S1 & S2 heard, RRR. No JVD, murmurs, rubs, gallops or clicks.  Gastrointestinal system: Abdomen is nondistended, soft and nontender. Normal bowel sounds  heard. Central nervous system: Alert and oriented. No focal neurological deficits. Extremities: mild edema b/l Psychiatry:  Mood & affect appropriate.     Data Reviewed: I have personally reviewed following labs and imaging studies  CBC: Recent Labs  Lab 05/01/19 2049 05/02/19 0500  WBC 11.3* 9.0  NEUTROABS 7.5  --   HGB 13.6 13.4  HCT 42.1 43.0  MCV 94.6 98.4  PLT 232 0000000   Basic Metabolic Panel: Recent Labs  Lab 05/01/19 2049 05/02/19 0500  NA 141 139  K 5.0 5.1  CL 101 102  CO2 32 29  GLUCOSE 237* 243*  BUN 44* 43*  CREATININE 2.33* 1.95*  CALCIUM 9.3 8.9   GFR: Estimated Creatinine Clearance: 27.4 mL/min (A) (by C-G formula based on SCr of 1.95 mg/dL (H)). Liver Function Tests: Recent Labs  Lab 05/01/19 2049  AST 24  ALT 18  ALKPHOS 103  BILITOT 0.6  PROT 6.3*  ALBUMIN 3.6   No results for input(s): LIPASE, AMYLASE in the last 168 hours. No results for input(s): AMMONIA in the last 168 hours. Coagulation Profile: No results for input(s): INR, PROTIME in the last 168 hours. Cardiac Enzymes: No results for input(s): CKTOTAL, CKMB, CKMBINDEX, TROPONINI in the last 168 hours. BNP (last 3 results) No results for input(s): PROBNP in the last 8760 hours. HbA1C: Recent Labs    05/02/19 0500  HGBA1C 7.2*   CBG: Recent  Labs  Lab 05/02/19 0731 05/02/19 1132  GLUCAP 263* 379*   Lipid Profile: No results for input(s): CHOL, HDL, LDLCALC, TRIG, CHOLHDL, LDLDIRECT in the last 72 hours. Thyroid Function Tests: No results for input(s): TSH, T4TOTAL, FREET4, T3FREE, THYROIDAB in the last 72 hours. Anemia Panel: No results for input(s): VITAMINB12, FOLATE, FERRITIN, TIBC, IRON, RETICCTPCT in the last 72 hours. Sepsis Labs: Recent Labs  Lab 05/02/19 0500  PROCALCITON <0.10    No results found for this or any previous visit (from the past 240 hour(s)).       Radiology Studies: DG Chest Port 1 View  Result Date: 05/01/2019 CLINICAL DATA:   Shortness of breath EXAM: PORTABLE CHEST 1 VIEW COMPARISON:  03/20/2019 FINDINGS: Stable cardiomediastinal contours. Mild linear atelectasis in the left lung base. No focal airspace consolidation, pleural effusion, or pneumothorax. Reverse left shoulder arthroplasty. IMPRESSION: Mild linear atelectasis in the left lung base. Electronically Signed   By: Davina Poke M.D.   On: 05/01/2019 22:08        Scheduled Meds: . aspirin  81 mg Oral Daily  . atorvastatin  40 mg Oral q1800  . citalopram  20 mg Oral Daily  . febuxostat  40 mg Oral Daily  . furosemide  40 mg Oral Daily  . heparin  5,000 Units Subcutaneous Q8H  . hydrALAZINE  25 mg Oral Q8H  . insulin aspart  0-9 Units Subcutaneous TID WC  . insulin glargine  20 Units Subcutaneous QHS  . isosorbide mononitrate  60 mg Oral Daily  . metoprolol tartrate  75 mg Oral BID  . pantoprazole  40 mg Oral Daily   Continuous Infusions: . azithromycin Stopped (05/02/19 0330)  . cefTRIAXone (ROCEPHIN)  IV Stopped (05/02/19 0150)    Assessment & Plan:   Active Problems:   Hypoxia   Acute hypoxia secondary to questionable pneumonia vs worsening ILD - tx presumptively for pneumonia.  Start IV Rocephin and azithromycin - Obtain PCT - If no clinical improvement- consider CT chest  Will consider pulmonology consultation in a.m. if no improvement Start IV steroids 40 mg IV every 6 PPI for GI prophylaxis  Paroxysmal atrial fibrillation - off Coumadin since hx of subdural hematoma  - continue metoprolol Monitor heart rate  Weakness of LE - PT  Chronic diastolic heart failure - have mild lower extremity edema but no significant fluid overload - continue Lasix, Imdur  Acute on chronic kidney disease stage III - creatinine of 2.33 here - give 500cc bolus, now creatinine at baseline - avoid nephrotoxic agent  Type 2 diabetes - normally on 23 units of Lantus at home - continue 20units qHS and sensitive sliding scale  here  Hypertension - continue hydralazine   Hyperlipidemia Statin  DVT prophylaxis: Heparin Code Status: DNR Family Communication: None at bedside Disposition Plan: We will likely be here 2 midnight stays until medically stable       LOS: 1 day   Time spent: 45 minutes with more than 50% COC    Nolberto Hanlon, MD [facility] Pager [phone]  If 7PM-7AM, please contact night-coverage [email or web address] Password [email or web address] 05/02/2019, 12:21 PM

## 2019-05-02 NOTE — ED Notes (Signed)
Per pt, pt wife updated on pt status. Pt gave verbal consent to discuss treatment with wife and sister

## 2019-05-02 NOTE — ED Notes (Signed)
Report to michele, rn.

## 2019-05-02 NOTE — Progress Notes (Signed)
Patient arrived to the floor. Alert with confusion.  No signs of distress. Tele in place

## 2019-05-03 DIAGNOSIS — E1165 Type 2 diabetes mellitus with hyperglycemia: Secondary | ICD-10-CM

## 2019-05-03 DIAGNOSIS — Z794 Long term (current) use of insulin: Secondary | ICD-10-CM

## 2019-05-03 LAB — BASIC METABOLIC PANEL
Anion gap: 11 (ref 5–15)
BUN: 52 mg/dL — ABNORMAL HIGH (ref 8–23)
CO2: 28 mmol/L (ref 22–32)
Calcium: 9 mg/dL (ref 8.9–10.3)
Chloride: 102 mmol/L (ref 98–111)
Creatinine, Ser: 2.01 mg/dL — ABNORMAL HIGH (ref 0.61–1.24)
GFR calc Af Amer: 34 mL/min — ABNORMAL LOW (ref >60)
GFR calc non Af Amer: 29 mL/min — ABNORMAL LOW (ref >60)
Glucose, Bld: 299 mg/dL — ABNORMAL HIGH (ref 70–99)
Potassium: 4.9 mmol/L (ref 3.5–5.1)
Sodium: 141 mmol/L (ref 135–145)

## 2019-05-03 LAB — GLUCOSE, CAPILLARY
Glucose-Capillary: 265 mg/dL — ABNORMAL HIGH (ref 70–99)
Glucose-Capillary: 446 mg/dL — ABNORMAL HIGH (ref 70–99)
Glucose-Capillary: 448 mg/dL — ABNORMAL HIGH (ref 70–99)
Glucose-Capillary: 323 mg/dL — ABNORMAL HIGH (ref 70–99)
Glucose-Capillary: 263 mg/dL — ABNORMAL HIGH (ref 70–99)
Glucose-Capillary: 207 mg/dL — ABNORMAL HIGH (ref 70–99)

## 2019-05-03 LAB — PROCALCITONIN: Procalcitonin: <0.1 ng/mL

## 2019-05-03 MED ORDER — INSULIN GLARGINE 100 UNIT/ML ~~LOC~~ SOLN
25.0000 [IU] | Freq: Every day | SUBCUTANEOUS | Status: DC
  Administered 2019-05-04 – 2019-05-05 (×3): 25 [IU] via SUBCUTANEOUS
  Filled 2019-05-03 (×4): qty 0.25

## 2019-05-03 MED ORDER — PREDNISONE 20 MG PO TABS
40.0000 mg | ORAL_TABLET | Freq: Every day | ORAL | Status: DC
  Administered 2019-05-04 – 2019-05-05 (×2): 40 mg via ORAL
  Filled 2019-05-03 (×2): qty 2

## 2019-05-03 NOTE — Plan of Care (Signed)

## 2019-05-03 NOTE — Progress Notes (Signed)
Inpatient Diabetes Program Recommendations  AACE/ADA: New Consensus Statement on Inpatient Glycemic Control (2015)  Target Ranges:  Prepandial:   less than 140 mg/dL      Peak postprandial:   less than 180 mg/dL (1-2 hours)      Critically ill patients:  140 - 180 mg/dL   Results for Justin Leach, Justin Leach (MRN [id number]) as of 05/03/2019 09:57  Ref. Range 05/02/2019 07:31 05/02/2019 11:32 05/02/2019 16:47 05/02/2019 20:52  Glucose-Capillary Latest Ref Range: 70 - 99 mg/dL 263 (H) 379 (H)  9 units NOVOLOG  250 (H)  3 units NOVOLOG  219 (H)    20 units LANTUS given at 11pm   Results for Justin Leach, Justin Leach (MRN [id number]) as of 05/03/2019 09:57  Ref. Range 05/03/2019 08:55  Glucose-Capillary Latest Ref Range: 70 - 99 mg/dL 265 (H)  5 units NOVOLOG    Results for Justin Leach, Justin Leach (MRN [id number]) as of 05/03/2019 09:57  Ref. Range 05/02/2019 05:00  Hemoglobin A1C Latest Ref Range: 4.8 - 5.6 % 7.2 (H)    Admit with: Acute hypoxia secondary to questionable pneumonia vs worsening ILD  History: DM, CHF, CKD, Interstitial lung disease   Home DM Meds: Lantus 23 units QHS       Admelog (insulin lispro) 8 units TID       Admelog (insulin lispro) 0-12 units TID per SSI  Current Orders: Lantus 20 units QHS      Novolog Sensitive Correction Scale/ SSI (0-9 units) TID AC      Getting Solumedrol 40 mg Q6 hours.  CBGs elevated again this AM and yesterday afternoon.    MD- Please consider the following in-hospital insulin adjustments while patient remains on IV steroids:  1. Increase Lantus to 25 units QHS  2. Start Novolog Meal Coverage: Novolog 6 units TID with meals  (Please add the following Hold Parameters: Hold if pt eats <50% of meal, Hold if pt NPO)      --Will follow patient during hospitalization--  Wyn Quaker RN, MSN, CDE Diabetes Coordinator Inpatient Glycemic Control Team Team Pager: [phone] (8a-5p)

## 2019-05-03 NOTE — Progress Notes (Addendum)
PROGRESS NOTE    Justin Leach  [id number] DOB: 1931-08-08 DOA: 05/01/2019 PCP: Alvester Morin, MD    Brief Narrative:  Justin Leach is a 85 y.o. male with medical history significant of interstitial lung disease not on chronic oxygen, paroxysmal atrial fibrillation, hx of subdural hematoma, chronic diastolic heart failure, CKD stage III, type 2 diabetes, hypertension who presents with concerns of increasing shortness of breath.   Patient is alert and oriented to self and location only and was unable to provide any history.  Reportedly per EMS patient was recently diagnosed with pneumonia and started to have worsening shortness of breath.  He was found to be hypoxic down to about 80% on room air and had improvement on 3 L via nasal cannula. CXR shows no acute pulmonary process.      Consultants:   None  Procedures: CT  Antimicrobials:   Ceftriaxone azithromycin   Subjective: Patient reports shortness of breath will get better not at baseline still.  Denies any chest pain or any other symptoms.   Objective: Vitals:   05/02/19 2305 05/03/19 0628 05/03/19 0854 05/03/19 0952  BP: (!) 124/49 (!) 161/61 (!) 170/53 (!) 150/52  Pulse: (!) 57 (!) 55 (!) 57 61  Resp: 16 16 17    Temp:   98.2 F (36.8 C)   TempSrc:      SpO2: 99% 99% 100%   Weight:      Height:       No intake or output data in the 24 hours ending 05/03/19 1204 Filed Weights   05/01/19 2026  Weight: 79.4 kg    Examination:  General exam: Appears calm and comfortable , on oxygen. Cooperative with exam when awakens Respiratory system: More clear to auscultation no wheezing or rhonchi  Cardiovascular system: S1 & S2 heard, RRR. No murmurs, rubs, gallops or clicks.  Gastrointestinal system: Abdomen is nondistended, soft and nontender. Normal bowel sounds heard.  No guarding Central nervous system: Alert and oriented. No focal neurological deficits. Extremities: mild edema b/l Psychiatry:   Mood & affect appropriate.     Data Reviewed: I have personally reviewed following labs and imaging studies  CBC: Recent Labs  Lab 05/01/19 2049 05/02/19 0500  WBC 11.3* 9.0  NEUTROABS 7.5  --   HGB 13.6 13.4  HCT 42.1 43.0  MCV 94.6 98.4  PLT 232 0000000   Basic Metabolic Panel: Recent Labs  Lab 05/01/19 2049 05/02/19 0500 05/03/19 0759  NA 141 139 141  K 5.0 5.1 4.9  CL 101 102 102  CO2 32 29 28  GLUCOSE 237* 243* 299*  BUN 44* 43* 52*  CREATININE 2.33* 1.95* 2.01*  CALCIUM 9.3 8.9 9.0   GFR: Estimated Creatinine Clearance: 26.6 mL/min (A) (by C-G formula based on SCr of 2.01 mg/dL (H)). Liver Function Tests: Recent Labs  Lab 05/01/19 2049  AST 24  ALT 18  ALKPHOS 103  BILITOT 0.6  PROT 6.3*  ALBUMIN 3.6   No results for input(s): LIPASE, AMYLASE in the last 168 hours. No results for input(s): AMMONIA in the last 168 hours. Coagulation Profile: No results for input(s): INR, PROTIME in the last 168 hours. Cardiac Enzymes: No results for input(s): CKTOTAL, CKMB, CKMBINDEX, TROPONINI in the last 168 hours. BNP (last 3 results) No results for input(s): PROBNP in the last 8760 hours. HbA1C: Recent Labs    05/02/19 0500  HGBA1C 7.2*   CBG: Recent Labs  Lab 05/02/19 1647 05/02/19 2052 05/03/19 0855 05/03/19 1149 05/03/19 1150  GLUCAP 250* 219* 265* 446* 448*   Lipid Profile: No results for input(s): CHOL, HDL, LDLCALC, TRIG, CHOLHDL, LDLDIRECT in the last 72 hours. Thyroid Function Tests: No results for input(s): TSH, T4TOTAL, FREET4, T3FREE, THYROIDAB in the last 72 hours. Anemia Panel: No results for input(s): VITAMINB12, FOLATE, FERRITIN, TIBC, IRON, RETICCTPCT in the last 72 hours. Sepsis Labs: Recent Labs  Lab 05/02/19 0500 05/03/19 0309  PROCALCITON <0.10 <0.10    No results found for this or any previous visit (from the past 240 hour(s)).       Radiology Studies: DG Chest Port 1 View  Result Date: 05/01/2019 CLINICAL DATA:   Shortness of breath EXAM: PORTABLE CHEST 1 VIEW COMPARISON:  03/20/2019 FINDINGS: Stable cardiomediastinal contours. Mild linear atelectasis in the left lung base. No focal airspace consolidation, pleural effusion, or pneumothorax. Reverse left shoulder arthroplasty. IMPRESSION: Mild linear atelectasis in the left lung base. Electronically Signed   By: Davina Poke M.D.   On: 05/01/2019 22:08        Scheduled Meds: . aspirin  81 mg Oral Daily  . atorvastatin  40 mg Oral q1800  . citalopram  20 mg Oral Daily  . febuxostat  40 mg Oral Daily  . furosemide  40 mg Oral Daily  . heparin  5,000 Units Subcutaneous Q8H  . hydrALAZINE  25 mg Oral Q8H  . insulin aspart  0-9 Units Subcutaneous TID WC  . insulin glargine  20 Units Subcutaneous QHS  . isosorbide mononitrate  60 mg Oral Daily  . metoprolol tartrate  75 mg Oral BID  . pantoprazole  40 mg Oral Daily  . [START ON 05/04/2019] predniSONE  40 mg Oral Q breakfast   Continuous Infusions: . azithromycin 500 mg (05/03/19 0359)  . cefTRIAXone (ROCEPHIN)  IV 1 g (05/02/19 2318)    Assessment & Plan:   Active Problems:   Hypoxia   Acute hypoxia secondary to questionable pneumonia vs worsening ILD - tx presumptively for pneumonia.   Improving now. Continue IV Rocephin and azithromycin Switch iv steroid to PO Will wean off of O2, I weaned him down to 3 L he was satting 100%. PPI for GI prophylaxis  Paroxysmal atrial fibrillation - off Coumadin since hx of subdural hematoma  - continue metoprolol Monitor heart rate  Weakness of LE - PT -OT  Chronic diastolic heart failure - have mild lower extremity edema but no significant fluid overload - continue Lasix, Imdur  Monitor volume status Acute on chronic kidney disease stage III - creatinine of 2.33 here - give 500cc bolus, now creatinine at baseline - avoid nephrotoxic agent  Type 2 diabetes - normally on 23 units of Lantus at home -Blood glucose increased with  IV steroids, should be going down since switched to p.o. steroid We will increase Lantus from 20 to 25 units nightly and sliding scale   Hypertension - continue hydralazine Stable  Hyperlipidemia Statin  DVT prophylaxis: Heparin Code Status: DNR Family Communication: None at bedside Disposition Plan: Possible DC in 1 to 2 days if medically stable       LOS: 2 days   Time spent: 45 minutes with more than 50% COC    Nolberto Hanlon, MD [facility] Pager [phone]  If 7PM-7AM, please contact night-coverage [email or web address] Password [facility] 05/03/2019, 12:04 PM Patient ID: Justin Leach, male   DOB: 10-19-1931, 85 y.o.   MRN: [id number]

## 2019-05-03 NOTE — TOC Initial Note (Addendum)
Transition of Care [facility]) - Initial/Assessment Note    Patient Details  Name: Justin Leach MRN: [id number] Date of Birth: 07/12/1931  [facility] CM/SW Contact:    Justin Flood, LCSW Phone Number: 05/03/2019, 11:14 AM  Clinical Narrative:                  Pt admitted from [facility]. Reviewed pt's record. PT recommending SNF rehab for pt. Spoke with Justin Leach at [place] to update. Per Justin Leach, the staff at [facility] feel this would be appropriate. She states pt has declined cognitively and physically in the last month. She states that prior to this past few weeks, pt was independent in ambulation and most ADLs. He was only requiring min assist with showering. He was using a walker for ambulation.   Pt's nephew, Justin Leach, is his POA per [place]. Spoke with [place] Char) by phone this AM. He indicates that he really does not like the idea of sending pt to SNF rehab due to [facility] and also due to pt's recent cognitive declines.  Justin Leach would like to call [employer] services (pt was connected to this agency by his insurance) and also [facility] at [place] to see if there is any way they can get pt back to [place] at dc. Justin Leach will return call to [facility] after he makes these inquiries to update on what he wants to do.  Will follow.  1525: Spoke with nephew in pt's room and again with [facility] at [place] by phone. Justin Leach states that if pt can at least pivot to a chair and/or take a couple steps, they will take him back and have their [facility] PT/OT work with him. Pt's nephew states that the family will hire extra caregivers if necessary in order for pt to return to [place] as they do not want him going to SNF.  Assigned TOC will follow.  Expected Discharge Plan: Skilled Nursing Facility Barriers to Discharge: Continued Medical Work up   Patient Goals and CMS Choice Patient states their goals for this hospitalization and ongoing recovery are:: Return to prior level of function CMS  Medicare.gov Compare Post Acute Care list provided to:: Patient Choice offered to / list presented to : Adult Children  Expected Discharge Plan and Services Expected Discharge Plan: [place] In-house Referral: Clinical Social Work   Post Acute Care Choice: [place] Living arrangements for the past 2 months: [place]                                      Prior Living Arrangements/Services Living arrangements for the past 2 months: [place] Lives with:: Facility Resident Patient language and need for interpreter reviewed:: Yes Do you feel safe going back to the place where you live?: Yes      Need for Family Participation in Patient Care: No (Comment) Care giver support system in place?: Yes (comment) Current home services: DME Criminal Activity/Legal Involvement Pertinent to Current Situation/Hospitalization: No - Comment as needed  Activities of Daily Living      Permission Sought/Granted Permission sought to share information with : Facility Art therapist granted to share information with : Yes, Verbal Permission Granted     Permission granted to share info w AGENCY: local SNFs        Emotional Assessment Appearance:: Appears stated age Attitude/Demeanor/Rapport: Engaged Affect (typically observed): Pleasant Orientation: : Oriented to  Self, Oriented to Place Alcohol / Substance Use: Not Applicable Psych Involvement: No (comment)  Admission diagnosis:  SOB (shortness of breath) [R06.02] Hypoxia [R09.02] Patient Active Problem List   Diagnosis Date Noted  . Hypoxia 05/01/2019  . Interstitial pulmonary disease ([place]) 07/29/2018  . Pneumonia 07/20/2018  . Palliative care encounter 06/26/2018  . Weakness generalized 06/26/2018  . HCAP (healthcare-associated pneumonia) 06/22/2018  . Gouty arthritis of right great toe 06/05/2018  . Hypertensive heart and kidney disease with  chronic diastolic congestive heart failure and stage 3 chronic kidney disease ([place]) 06/01/2018  . Dyslipidemia associated with type 2 diabetes mellitus ([place]) 06/01/2018  . Chronic constipation 06/01/2018  . CKD stage 3 due to type 2 diabetes mellitus ([place]) 06/01/2018  . Volume overload 05/20/2018  . Fall 05/13/2018  . SDH (subdural hematoma) ([place]) 05/13/2018  . Closed comminuted left humeral fracture 05/13/2018  . Hyperkalemia 05/13/2018  . Leukocytosis 05/13/2018  . Fall at home   . Subdural hematoma without coma ([place])   . Cough   . GERD (gastroesophageal reflux disease)   . Pulmonary fibrosis ([place])   . Coronary artery calcification seen on CT scan   . Chronic diastolic CHF (congestive heart failure) ([place])   . PAF (paroxysmal atrial fibrillation) ([place])   . CKD (chronic kidney disease), stage III ([place])   . Type II diabetes mellitus with renal manifestations ([place])   . Essential hypertension   . Chest pain 02/28/2015  . Renal failure (ARF), acute on chronic ([place]) 02/28/2015  . Hyperlipidemia 02/28/2015  . Dyspnea 03/29/2014  . DOE (dyspnea on exertion) 03/29/2014  . Bradycardia 03/29/2014  . Bronchospasm 05/15/2013   PCP:  Alvester Morin, MD Pharmacy:   [facility] [address] [place] [address] Phone: [phone] Fax: [phone]     Social Determinants of Health (SDOH) Interventions    Readmission Risk Interventions Readmission Risk Prevention Plan 05/03/2019  Transportation Screening Complete  HRI or [place] Not Complete  HRI or Home Care Consult comments Pt going to SNF rehab  Social Work Consult for [place] Planning/Counseling [place] Not Applicable  Medication Review (RN Care Manager) Complete  Some recent data might be hidden

## 2019-05-03 NOTE — Progress Notes (Signed)
Physical Therapy Treatment Patient Details Name: Justin Leach MRN: [id number] DOB: 06-01-1931 Today's Date: 05/03/2019    History of Present Illness From MD note: Pt is an 85 y.o. male with medical history significant of interstitial lung disease not on chronic oxygen, paroxysmal atrial fibrillation, hx of subdural hematoma, chronic diastolic heart failure, CKD stage III, type 2 diabetes, hypertension who presents with concerns of increasing shortness of breath.  MD assessment includes: Acute hypoxia secondary to questionable pneumonia vs worsening ILD, A-fib, weakness, Chronic diastolic heart failure, acute on chronic kidney disease stage III, DM II, HTN, and HLD.    PT Comments    Pt presented with deficits in strength, transfers, mobility, gait, balance, and activity tolerance.  Pt continued to require physical assistance with all functional mobility but actively participated throughout and made some progress towards goals.  Pt was able to stand with decreased assistance and increased his amb tolerance.  Pt continued to require physical assistance to prevent posterior LOB with all standing activities but stability grossly improved from prior session.  Pt will benefit from PT services in a SNF setting upon discharge to safely address above deficits for decreased caregiver assistance and eventual return to PLOF.     Follow Up Recommendations  SNF     Equipment Recommendations  None recommended by PT    Recommendations for Other Services       Precautions / Restrictions Precautions Precautions: Fall Restrictions Weight Bearing Restrictions: No    Mobility  Bed Mobility Overal bed mobility: Needs Assistance Bed Mobility: Supine to Sit;Sit to Supine     Supine to sit: Mod assist Sit to supine: Mod assist   General bed mobility comments: Mod A for BLEs and trunk control  Transfers Overall transfer level: Needs assistance Equipment used: Rolling walker (2  wheeled) Transfers: Sit to/from Stand Sit to Stand: From elevated surface;Min assist         General transfer comment: Min A to stand and to prevent posterior LOB once in standing  Ambulation/Gait Ambulation/Gait assistance: Mod assist Gait Distance (Feet): 3 Feet Assistive device: Rolling walker (2 wheeled) Gait Pattern/deviations: Step-to pattern;Trunk flexed Gait velocity: decreased   General Gait Details: Pt able to take 3-4 very small steps at the EOB with Mod A for stability and to guide the RW   Stairs             Wheelchair Mobility    Modified Rankin (Stroke Patients Only)       Balance Overall balance assessment: Needs assistance   Sitting balance-Leahy Scale: Fair     Standing balance support: Bilateral upper extremity supported Standing balance-Leahy Scale: Poor Standing balance comment: Posterior instability                            Cognition Arousal/Alertness: Awake/alert Behavior During Therapy: WFL for tasks assessed/performed Overall Cognitive Status: No family/caregiver present to determine baseline cognitive functioning                                        Exercises Total Joint Exercises Ankle Circles/Pumps: AROM;Both;10 reps Quad Sets: Strengthening;Both Heel Slides: AAROM;Both;10 reps Hip ABduction/ADduction: AAROM;Both;10 reps Straight Leg Raises: AAROM;Both;10 reps Long Arc Quad: AROM;Both;10 reps Knee Flexion: AROM;Both;10 reps    General Comments        Pertinent Vitals/Pain Pain Assessment: No/denies pain  Home Living                      Prior Function            PT Goals (current goals can now be found in the care plan section) Progress towards PT goals: Progressing toward goals    Frequency    Min 2X/week      PT Plan Current plan remains appropriate    Co-evaluation              AM-PAC PT "6 Clicks" Mobility   Outcome Measure  Help needed turning  from your back to your side while in a flat bed without using bedrails?: A Lot Help needed moving from lying on your back to sitting on the side of a flat bed without using bedrails?: A Lot Help needed moving to and from a bed to a chair (including a wheelchair)?: A Lot Help needed standing up from a chair using your arms (e.g., wheelchair or bedside chair)?: A Little Help needed to walk in hospital room?: Total Help needed climbing 3-5 steps with a railing? : Total 6 Click Score: 11    End of Session Equipment Utilized During Treatment: Gait belt;Oxygen Activity Tolerance: Patient tolerated treatment well Patient left: in bed;with call bell/phone within reach;with bed alarm set;with nursing/sitter in room Nurse Communication: Mobility status PT Visit Diagnosis: Unsteadiness on feet (R26.81);Muscle weakness (generalized) (M62.81);Difficulty in walking, not elsewhere classified (R26.2)     Time: [id number] PT Time Calculation (min) (ACUTE ONLY): 25 min  Charges:  $Gait Training: 8-22 mins $Therapeutic Exercise: 8-22 mins                     D. Scott Amyrie Illingworth PT, DPT 05/03/19, 5:28 PM

## 2019-05-04 LAB — GLUCOSE, CAPILLARY
Glucose-Capillary: 173 mg/dL — ABNORMAL HIGH (ref 70–99)
Glucose-Capillary: 184 mg/dL — ABNORMAL HIGH (ref 70–99)
Glucose-Capillary: 206 mg/dL — ABNORMAL HIGH (ref 70–99)
Glucose-Capillary: 215 mg/dL — ABNORMAL HIGH (ref 70–99)
Glucose-Capillary: 284 mg/dL — ABNORMAL HIGH (ref 70–99)

## 2019-05-04 MED ORDER — HYDRALAZINE HCL 50 MG PO TABS
50.0000 mg | ORAL_TABLET | Freq: Three times a day (TID) | ORAL | Status: DC
  Administered 2019-05-04 – 2019-05-05 (×4): 50 mg via ORAL
  Filled 2019-05-04 (×4): qty 1

## 2019-05-04 NOTE — Care Management Important Message (Signed)
Important Message  Patient Details  Name: Justin Leach MRN: [id number] Date of Birth: [birth year]   Medicare Important Message Given:  Yes     [name] 05/04/2019, 11:55 AM

## 2019-05-04 NOTE — Progress Notes (Signed)
Physical Therapy Treatment Patient Details Name: Justin Leach MRN: [id number] DOB: Jun 07, 1931 Today's Date: 05/04/2019    History of Present Illness 85 y.o. male with medical history significant of interstitial lung disease not on chronic oxygen, paroxysmal atrial fibrillation, hx of subdural hematoma, chronic diastolic heart failure, CKD stage III, type 2 diabetes, hypertension who presents with concerns of increasing shortness of breath.  MD assessment includes: Acute hypoxia secondary to questionable pneumonia vs worsening ILD, A-fib, weakness, Chronic diastolic heart failure, acute on chronic kidney disease stage III, DM II, HTN, and HLD.    PT Comments    Pt continues to be very limited with mobility and during ambulation was unable to take any step larger than 1-2".  He particularly struggled with hip Abd both functionally during mobility/ambulation and during exercises.  Pt pleasant and eager to work with PT but was continually unable to comprehend how/why he is so weak and limited; needing consistent cuing and reinforcement to fully participate.     Follow Up Recommendations  SNF     Equipment Recommendations  None recommended by PT    Recommendations for Other Services       Precautions / Restrictions Precautions Precautions: Fall Restrictions Weight Bearing Restrictions: No    Mobility  Bed Mobility Overal bed mobility: Needs Assistance Bed Mobility: Supine to Sit;Sit to Supine     Supine to sit: Max assist;HOB elevated Sit to supine: Max assist   General bed mobility comments: Pt showed some effort in trying to get in/out of bed but ultimately needed a lot of assist  Transfers Overall transfer level: Needs assistance Equipment used: Rolling walker (2 wheeled) Transfers: Sit to/from Stand Sit to Stand: From elevated surface;Mod assist;+2 physical assistance         General transfer comment: Min A to stand and to prevent posterior LOB once in  standing  Ambulation/Gait Ambulation/Gait assistance: Mod assist Gait Distance (Feet): 4 Feet Assistive device: Rolling walker (2 wheeled)       General Gait Details: Pt able to take 3-4 very small steps at the EOB with Mod A for stability and to guide the RW.  Very much struggled with any foot clearance and in attempt to side step along EOB unable to move more than ~1" each "step"   Stairs             Wheelchair Mobility    Modified Rankin (Stroke Patients Only)       Balance Overall balance assessment: Needs assistance   Sitting balance-Leahy Scale: Fair     Standing balance support: Bilateral upper extremity supported Standing balance-Leahy Scale: Poor Standing balance comment: Posterior instability                            Cognition Arousal/Alertness: Awake/alert Behavior During Therapy: WFL for tasks assessed/performed Overall Cognitive Status: Impaired/Different from baseline Area of Impairment: Orientation                 Orientation Level: Disoriented to;Time;Situation             General Comments: follows commands with cues, HOH, a bit confused during session      Exercises General Exercises - Lower Extremity Ankle Circles/Pumps: AROM;10 reps Short Arc Quad: AROM;Strengthening;10 reps;Both Heel Slides: AAROM;10 reps(resisted leg extensions) Hip ABduction/ADduction: AAROM;10 reps(no AROM with ABd, some AROM with ADd) Other Exercises Other Exercises: Functional transfer training with RW for standing toileting task with OT/PT, requiring total assist  for pericare in standing; cues for sequencing and safety, very close base of support    General Comments General comments (skin integrity, edema, etc.): On room air:  O2 in high 90s at rest, low 90s (briefly into high 80s) with standing/activity.  Unable to do much walking at all and very quick to fatigue/DOE despite decent saturations.  Weak and functionally quite limited.       Pertinent Vitals/Pain Pain Assessment: No/denies pain    Home Living Family/patient expects to be discharged to:: Unsure(per chart, family wants to take him home; pt from ALF)                    Prior Function Level of Independence: Needs assistance  Gait / Transfers Assistance Needed: Pt able to amb short distances with a RW; per chart indicating decline over past month ADL's / Homemaking Assistance Needed: Assist with ADLs from staff recently     PT Goals (current goals can now be found in the care plan section) Acute Rehab PT Goals Patient Stated Goal: go back to ALF Progress towards PT goals: Progressing toward goals    Frequency    Min 2X/week      PT Plan Current plan remains appropriate    Co-evaluation PT/OT/SLP Co-Evaluation/Treatment: Yes Reason for Co-Treatment: For patient/therapist safety;To address functional/ADL transfers PT goals addressed during session: Mobility/safety with mobility OT goals addressed during session: Proper use of Adaptive equipment and DME;ADL's and self-care      AM-PAC PT "6 Clicks" Mobility   Outcome Measure  Help needed turning from your back to your side while in a flat bed without using bedrails?: A Lot Help needed moving from lying on your back to sitting on the side of a flat bed without using bedrails?: A Lot Help needed moving to and from a bed to a chair (including a wheelchair)?: A Lot Help needed standing up from a chair using your arms (e.g., wheelchair or bedside chair)?: A Lot Help needed to walk in hospital room?: A Lot Help needed climbing 3-5 steps with a railing? : Total 6 Click Score: 11    End of Session Equipment Utilized During Treatment: Gait belt Activity Tolerance: Patient tolerated treatment well Patient left: in bed;with call bell/phone within reach;with bed alarm set;with nursing/sitter in room Nurse Communication: Mobility status PT Visit Diagnosis: Unsteadiness on feet (R26.81);Muscle  weakness (generalized) (M62.81);Difficulty in walking, not elsewhere classified (R26.2)     Time: [id number] PT Time Calculation (min) (ACUTE ONLY): 29 min  Charges:  $Therapeutic Exercise: 8-22 mins $Therapeutic Activity: 8-22 mins                     Kreg Shropshire, DPT 05/04/2019, 5:46 PM

## 2019-05-04 NOTE — Evaluation (Signed)
Occupational Therapy Evaluation Patient Details Name: Justin Leach MRN: [id number] DOB: 14-Jun-1931 Today's Date: 05/04/2019    History of Present Illness From MD note: Pt is an 85 y.o. male with medical history significant of interstitial lung disease not on chronic oxygen, paroxysmal atrial fibrillation, hx of subdural hematoma, chronic diastolic heart failure, CKD stage III, type 2 diabetes, hypertension who presents with concerns of increasing shortness of breath.  MD assessment includes: Acute hypoxia secondary to questionable pneumonia vs worsening ILD, A-fib, weakness, Chronic diastolic heart failure, acute on chronic kidney disease stage III, DM II, HTN, and HLD.   Clinical Impression   Pt seen for OT evaluation and co-tx with PT this date. Prior to hospital admission, pt was ambulating short distances with RW and required some assist from staff for ADL at ALF. Per chart, pt has had a functional decline in the past month. Nephew present for session. Pt noted to have wet through bedding. Confused, but able to follow commands with verbal cues. Pt instructed in bed mobility and functional transfer/mobility training with OT/PT. Pt noted to start having bowel movement with transfer. Total assist for pericare in standing with PT supporting standing balance. Nurse tech in to assist in bed linens change while pt in standing. Pt required Mod A x2 to stand and take very small, shuffled steps forward and backward with max assist for RW mgt, fairly unsteady. Currently pt demonstrates impairments in cognition, strength, activity tolerance, and balance requiring 1-2 assist for ADL and functional mobility. HR in mid to high 50's and O2 sats in mid-90's during session. BP 162/72 seated EOB with pt endorsing mild dizziness with sup<>sit that dissipated quickly. Pt would benefit from skilled OT to address noted impairments and functional limitations (see below for any additional details) in order to maximize  safety and independence while minimizing falls risk and caregiver burden.  Upon hospital discharge, strongly recommend pt discharge to SNF.     Follow Up Recommendations  SNF    Equipment Recommendations  3 in 1 bedside commode    Recommendations for Other Services       Precautions / Restrictions Precautions Precautions: Fall Restrictions Weight Bearing Restrictions: No      Mobility Bed Mobility Overal bed mobility: Needs Assistance Bed Mobility: Supine to Sit;Sit to Supine     Supine to sit: Max assist;HOB elevated Sit to supine: Max assist   General bed mobility comments: Mod A for BLEs and trunk control  Transfers Overall transfer level: Needs assistance Equipment used: Rolling walker (2 wheeled) Transfers: Sit to/from Stand Sit to Stand: From elevated surface;Mod assist;+2 physical assistance              Balance Overall balance assessment: Needs assistance   Sitting balance-Leahy Scale: Fair     Standing balance support: Bilateral upper extremity supported Standing balance-Leahy Scale: Poor                             ADL either performed or assessed with clinical judgement   ADL Overall ADL's : Needs assistance/impaired     Grooming: Sitting;Set up;Min guard   Upper Body Bathing: Sitting;Minimal assistance   Lower Body Bathing: Sitting/lateral leans;Maximal assistance   Upper Body Dressing : Sitting;Moderate assistance   Lower Body Dressing: Sit to/from stand;Moderate assistance;+2 for physical assistance   Toilet Transfer: Ambulation;RW;BSC;Moderate assistance;Cueing for safety;Cueing for sequencing;+2 for physical assistance   Toileting- Clothing Manipulation and Hygiene: Total assistance;+2 for physical assistance  Toileting - Clothing Manipulation Details (indicate cue type and reason): in standing, total assist for pericare with PT providing assist for standing balance             Vision Baseline Vision/History: Wears  glasses Wears Glasses: At all times Patient Visual Report: No change from baseline Vision Assessment?: No apparent visual deficits     Perception     Praxis      Pertinent Vitals/Pain Pain Assessment: No/denies pain     Hand Dominance Left   Extremity/Trunk Assessment Upper Extremity Assessment Upper Extremity Assessment: Generalized weakness   Lower Extremity Assessment Lower Extremity Assessment: Generalized weakness       Communication Communication Communication: HOH   Cognition Arousal/Alertness: Awake/alert Behavior During Therapy: WFL for tasks assessed/performed Overall Cognitive Status: Impaired/Different from baseline Area of Impairment: Orientation                 Orientation Level: Disoriented to;Time;Situation             General Comments: follows commands with cues, HOH, a bit confused during session   General Comments       Exercises Other Exercises Other Exercises: Functional transfer training with RW for standing toileting task with OT/PT, requiring total assist for pericare in standing; cues for sequencing and safety, very close base of support   Shoulder Instructions      Home Living Family/patient expects to be discharged to:: Unsure(per chart, family wants to take him home; pt from ALF)                                        Prior Functioning/Environment Level of Independence: Needs assistance  Gait / Transfers Assistance Needed: Pt able to amb short distances with a RW; per chart indicating decline over past month ADL's / Homemaking Assistance Needed: Assist with ADLs from staff recently            OT Problem List: Decreased strength;Decreased cognition;Decreased activity tolerance;Decreased safety awareness;Decreased knowledge of use of DME or AE;Impaired balance (sitting and/or standing)      OT Treatment/Interventions: Self-care/ADL training;Therapeutic exercise;Therapeutic activities;Cognitive  remediation/compensation;DME and/or AE instruction;Patient/family education;Balance training    OT Goals(Current goals can be found in the care plan section) Acute Rehab OT Goals Patient Stated Goal: go back to ALF OT Goal Formulation: With patient/family Time For Goal Achievement: 05/18/19 Potential to Achieve Goals: Fair ADL Goals Pt Will Perform Upper Body Dressing: with min assist;sitting Pt Will Transfer to Toilet: with min assist;bedside commode;ambulating;with mod assist(LRAD for amb) Pt Will Perform Toileting - Clothing Manipulation and hygiene: sit to/from stand;with mod assist Additional ADL Goal #1: Pt will perform bed mobility with Mod A x1 in preparation for seated ADL.  OT Frequency: Min 1X/week   Barriers to D/C:            Co-evaluation PT/OT/SLP Co-Evaluation/Treatment: Yes Reason for Co-Treatment: For patient/therapist safety;To address functional/ADL transfers PT goals addressed during session: Mobility/safety with mobility;Balance;Proper use of DME;Strengthening/ROM OT goals addressed during session: Proper use of Adaptive equipment and DME;ADL's and self-care      AM-PAC OT "6 Clicks" Daily Activity     Outcome Measure Help from another person eating meals?: None Help from another person taking care of personal grooming?: A Little Help from another person toileting, which includes using toliet, bedpan, or urinal?: Total Help from another person bathing (including washing, rinsing, drying)?: A Lot Help  from another person to put on and taking off regular upper body clothing?: A Lot Help from another person to put on and taking off regular lower body clothing?: A Lot 6 Click Score: 14   End of Session Equipment Utilized During Treatment: Rolling walker  Activity Tolerance: Patient tolerated treatment well Patient left: in bed;with call bell/phone within reach;Other (comment);with nursing/sitter in room;with family/visitor present(with PT for continued  session)  OT Visit Diagnosis: Other abnormalities of gait and mobility (R26.89);Muscle weakness (generalized) (M62.81);Other symptoms and signs involving cognitive function                Time: 1522-1550 OT Time Calculation (min): 28 min Charges:  OT General Charges $OT Visit: 1 Visit OT Evaluation $OT Eval Moderate Complexity: 1 Mod OT Treatments $Self Care/Home Management : 8-22 mins  Jeni Salles, MPH, MS, OTR/L ascom [phone] 05/04/19, 4:30 PM

## 2019-05-04 NOTE — Progress Notes (Signed)
PROGRESS NOTE    Justin Leach  [id number] DOB: Oct 12, 1931 DOA: 05/01/2019 PCP: Alvester Morin, MD    Brief Narrative:  Justin Leach is a 85 y.o. male with medical history significant of interstitial lung disease not on chronic oxygen, paroxysmal atrial fibrillation, hx of subdural hematoma, chronic diastolic heart failure, CKD stage III, type 2 diabetes, hypertension who presents with concerns of increasing shortness of breath.   Patient is alert and oriented to self and location only and was unable to provide any history.  Reportedly per EMS patient was recently diagnosed with pneumonia and started to have worsening shortness of breath.  He was found to be hypoxic down to about 80% on room air and had improvement on 3 L via nasal cannula. CXR shows no acute pulmonary process.      Consultants:   None  Procedures: CT  Antimicrobials:   Ceftriaxone azithromycin   Subjective: Patient is not happy with his breakfast he is asking for bacon.  Overall his breathing is better off of oxygen this AM.  Otherwise he has no complaints.  Objective: Vitals:   05/03/19 2051 05/03/19 2052 05/04/19 0025 05/04/19 0629  BP: (!) 139/46 (!) 139/46 (!) 149/49 (!) 175/58  Pulse: (!) 55 (!) 51 61 (!) 52  Resp:   16   Temp:  97.8 F (36.6 C) 98 F (36.7 C)   TempSrc:      SpO2:  94% 95%   Weight:      Height:        Intake/Output Summary (Last 24 hours) at 05/04/2019 1246 Last data filed at 05/04/2019 0600 Gross per 24 hour  Intake 700 ml  Output 500 ml  Net 200 ml   Filed Weights   05/01/19 2026  Weight: 79.4 kg    Examination:  General exam: Appears calm and comfortable , interactive today , NAD  respiratory system: increase exp. Time, no wheezing or rhonchi Cardiovascular system: S1 & S2 heard, RRR. No murmurs, rubs, gallops or clicks.  Gastrointestinal system: Abdomen is nondistended, soft and nontender. Normal bowel sounds heard.  No  guarding/rebound Central nervous system: Alert and oriented. No focal neurological deficits. Extremities: mild edema b/l Psychiatry:  Mood & affect appropriate in current setting    Data Reviewed: I have personally reviewed following labs and imaging studies  CBC: Recent Labs  Lab 05/01/19 2049 05/02/19 0500  WBC 11.3* 9.0  NEUTROABS 7.5  --   HGB 13.6 13.4  HCT 42.1 43.0  MCV 94.6 98.4  PLT 232 0000000   Basic Metabolic Panel: Recent Labs  Lab 05/01/19 2049 05/02/19 0500 05/03/19 0759  NA 141 139 141  K 5.0 5.1 4.9  CL 101 102 102  CO2 32 29 28  GLUCOSE 237* 243* 299*  BUN 44* 43* 52*  CREATININE 2.33* 1.95* 2.01*  CALCIUM 9.3 8.9 9.0   GFR: Estimated Creatinine Clearance: 26.6 mL/min (A) (by C-G formula based on SCr of 2.01 mg/dL (H)). Liver Function Tests: Recent Labs  Lab 05/01/19 2049  AST 24  ALT 18  ALKPHOS 103  BILITOT 0.6  PROT 6.3*  ALBUMIN 3.6   No results for input(s): LIPASE, AMYLASE in the last 168 hours. No results for input(s): AMMONIA in the last 168 hours. Coagulation Profile: No results for input(s): INR, PROTIME in the last 168 hours. Cardiac Enzymes: No results for input(s): CKTOTAL, CKMB, CKMBINDEX, TROPONINI in the last 168 hours. BNP (last 3 results) No results for input(s): PROBNP in the last 8760  hours. HbA1C: Recent Labs    05/02/19 0500  HGBA1C 7.2*   CBG: Recent Labs  Lab 05/03/19 1638 05/03/19 2011 05/04/19 0026 05/04/19 0733 05/04/19 1157  GLUCAP 263* 207* 206* 184* 173*   Lipid Profile: No results for input(s): CHOL, HDL, LDLCALC, TRIG, CHOLHDL, LDLDIRECT in the last 72 hours. Thyroid Function Tests: No results for input(s): TSH, T4TOTAL, FREET4, T3FREE, THYROIDAB in the last 72 hours. Anemia Panel: No results for input(s): VITAMINB12, FOLATE, FERRITIN, TIBC, IRON, RETICCTPCT in the last 72 hours. Sepsis Labs: Recent Labs  Lab 05/02/19 0500 05/03/19 0309  PROCALCITON <0.10 <0.10    No results found for  this or any previous visit (from the past 240 hour(s)).       Radiology Studies: No results found.      Scheduled Meds: . aspirin  81 mg Oral Daily  . atorvastatin  40 mg Oral q1800  . citalopram  20 mg Oral Daily  . febuxostat  40 mg Oral Daily  . furosemide  40 mg Oral Daily  . heparin  5,000 Units Subcutaneous Q8H  . hydrALAZINE  25 mg Oral Q8H  . insulin aspart  0-9 Units Subcutaneous TID WC  . insulin glargine  25 Units Subcutaneous QHS  . isosorbide mononitrate  60 mg Oral Daily  . metoprolol tartrate  75 mg Oral BID  . pantoprazole  40 mg Oral Daily  . predniSONE  40 mg Oral Q breakfast   Continuous Infusions: . azithromycin 500 mg (05/04/19 0206)  . cefTRIAXone (ROCEPHIN)  IV 200 mL/hr at 05/03/19 2104    Assessment & Plan:   Active Problems:   Acute kidney injury superimposed on chronic kidney disease (HCC)   Type 2 diabetes mellitus with hyperglycemia, with long-term current use of insulin (HCC)   Hypoxia   Acute hypoxia secondary to questionable pneumonia vs worsening ILD - tx presumptively for pneumonia.  Clinically improving wound off oxygen Will need to ambulate without oxygen to see where her O2 status  Continue IV Rocephin and azithromycin Started on prednisone 40 p.o. taper yesterday, day 2.  Will need slow taper of prednisone PPI for GI prophylaxis  Paroxysmal atrial fibrillation - off Coumadin since hx of subdural hematoma  - continue metoprolol Monitor heart rate  Weakness of LE - PT-rec. SNF -OT placed  Chronic diastolic heart failure - have mild lower extremity edema but no significant fluid overload - continue Lasix, Imdur Monitor volume status   Acute on chronic kidney disease stage III - creatinine of 2.33 here - give 500cc bolus, now creatinine at baseline - avoid nephrotoxic agent  Type 2 diabetes - normally on 23 units of Lantus at home  Lantus was from 20 to 25 units nightly and sliding scale Since IV steroids  switched to p.o. steroid BG improving Continue to monitor   Hypertension -Still has room for improvement, will increase hydralazine to 50 mg 3 times daily  Continue to monitor    Hyperlipidemia Statin  DVT prophylaxis: Heparin Code Status: DNR Family Communication: None at bedside Disposition Plan: SNF pending , PT pending , ambulate off o2         LOS: 3 days   Time spent: 45 minutes with more than 50% COC    Justin Hanlon, MD [facility] Pager [phone]  If 7PM-7AM, please contact night-coverage [email or web address] Password [facility] 05/04/2019, 12:46 PM Patient ID: Justin Leach, male   DOB: 09-26-1931, 85 y.o.   MRN: [id number] Patient ID: Justin Leach, male  DOB: Jun 28, 1931, 85 y.o.   MRN: [id number]

## 2019-05-05 DIAGNOSIS — J690 Pneumonitis due to inhalation of food and vomit: Secondary | ICD-10-CM

## 2019-05-05 DIAGNOSIS — J9602 Acute respiratory failure with hypercapnia: Secondary | ICD-10-CM

## 2019-05-05 DIAGNOSIS — J9601 Acute respiratory failure with hypoxia: Secondary | ICD-10-CM

## 2019-05-05 LAB — BASIC METABOLIC PANEL
Anion gap: 11 (ref 5–15)
BUN: 63 mg/dL — ABNORMAL HIGH (ref 8–23)
CO2: 29 mmol/L (ref 22–32)
Calcium: 8.6 mg/dL — ABNORMAL LOW (ref 8.9–10.3)
Chloride: 97 mmol/L — ABNORMAL LOW (ref 98–111)
Creatinine, Ser: 2.02 mg/dL — ABNORMAL HIGH (ref 0.61–1.24)
GFR calc Af Amer: 34 mL/min — ABNORMAL LOW (ref >60)
GFR calc non Af Amer: 29 mL/min — ABNORMAL LOW (ref >60)
Glucose, Bld: 278 mg/dL — ABNORMAL HIGH (ref 70–99)
Potassium: 4.6 mmol/L (ref 3.5–5.1)
Sodium: 137 mmol/L (ref 135–145)

## 2019-05-05 LAB — GLUCOSE, CAPILLARY
Glucose-Capillary: 169 mg/dL — ABNORMAL HIGH (ref 70–99)
Glucose-Capillary: 252 mg/dL — ABNORMAL HIGH (ref 70–99)
Glucose-Capillary: 200 mg/dL — ABNORMAL HIGH (ref 70–99)
Glucose-Capillary: 217 mg/dL — ABNORMAL HIGH (ref 70–99)

## 2019-05-05 MED ORDER — PREDNISONE 10 MG PO TABS
30.0000 mg | ORAL_TABLET | Freq: Every day | ORAL | Status: DC
  Administered 2019-05-06 – 2019-05-07 (×2): 30 mg via ORAL
  Filled 2019-05-05 (×2): qty 3

## 2019-05-05 MED ORDER — METOPROLOL TARTRATE 50 MG PO TABS
75.0000 mg | ORAL_TABLET | Freq: Two times a day (BID) | ORAL | Status: DC
  Administered 2019-05-05 – 2019-05-06 (×2): 75 mg via ORAL
  Filled 2019-05-05 (×3): qty 1

## 2019-05-05 MED ORDER — LISINOPRIL 5 MG PO TABS
2.5000 mg | ORAL_TABLET | Freq: Every day | ORAL | Status: DC
  Administered 2019-05-05: 18:00:00 2.5 mg via ORAL
  Filled 2019-05-05: qty 1

## 2019-05-05 MED ORDER — HYDRALAZINE HCL 50 MG PO TABS
50.0000 mg | ORAL_TABLET | Freq: Four times a day (QID) | ORAL | Status: DC
  Administered 2019-05-05 – 2019-05-08 (×13): 50 mg via ORAL
  Filled 2019-05-05 (×13): qty 1

## 2019-05-05 NOTE — Progress Notes (Signed)
PROGRESS NOTE    Justin Leach  [id number] DOB: 03-Aug-1931 DOA: 05/01/2019 PCP: Alvester Morin, MD    Brief Narrative:  Justin Leach is a 85 y.o. male with medical history significant of interstitial lung disease not on chronic oxygen, paroxysmal atrial fibrillation, hx of subdural hematoma, chronic diastolic heart failure, CKD stage III, type 2 diabetes, hypertension who presents with concerns of increasing shortness of breath.   Patient is alert and oriented to self and location only and was unable to provide any history.  Reportedly per EMS patient was recently diagnosed with pneumonia and started to have worsening shortness of breath.  He was found to be hypoxic down to about 80% on room air and had improvement on 3 L via nasal cannula. CXR shows no acute pulmonary process.      Consultants:   None  Procedures: CT  Antimicrobials:   Ceftriaxone azithromycin   Subjective: Feeling weak, blood pressure up.  No new complaints  Objective: Vitals:   05/04/19 2216 05/05/19 0025 05/05/19 0444 05/05/19 0806  BP: (!) 183/55 (!) 155/52 (!) 180/59 (!) 175/63  Pulse: 61 (!) 59 (!) 56 (!) 53  Resp:  16  18  Temp:  (!) 97.3 F (36.3 C)  98.6 F (37 C)  TempSrc:  Oral    SpO2: 93% 94% 95% 95%  Weight:      Height:        Intake/Output Summary (Last 24 hours) at 05/05/2019 1412 Last data filed at 05/05/2019 1203 Gross per 24 hour  Intake 590 ml  Output 1200 ml  Net -610 ml   Filed Weights   05/01/19 2026  Weight: 79.4 kg    Examination:  General exam: Appears calm and comfortable , interactive today , NAD  respiratory system: increase exp. Time, no wheezing or rhonchi Cardiovascular system: S1 & S2 heard, RRR. No murmurs, rubs, gallops or clicks.  Gastrointestinal system: Abdomen is nondistended, soft and nontender. Normal bowel sounds heard.  No guarding/rebound Central nervous system: Alert and oriented. No focal neurological deficits.  Extremities: mild edema b/l Psychiatry:  Mood & affect appropriate in current setting    Data Reviewed: I have personally reviewed following labs and imaging studies  CBC: Recent Labs  Lab 05/01/19 2049 05/02/19 0500  WBC 11.3* 9.0  NEUTROABS 7.5  --   HGB 13.6 13.4  HCT 42.1 43.0  MCV 94.6 98.4  PLT 232 0000000   Basic Metabolic Panel: Recent Labs  Lab 05/01/19 2049 05/02/19 0500 05/03/19 0759 05/05/19 0236  NA 141 139 141 137  K 5.0 5.1 4.9 4.6  CL 101 102 102 97*  CO2 32 29 28 29   GLUCOSE 237* 243* 299* 278*  BUN 44* 43* 52* 63*  CREATININE 2.33* 1.95* 2.01* 2.02*  CALCIUM 9.3 8.9 9.0 8.6*   GFR: Estimated Creatinine Clearance: 26.5 mL/min (A) (by C-G formula based on SCr of 2.02 mg/dL (H)). Liver Function Tests: Recent Labs  Lab 05/01/19 2049  AST 24  ALT 18  ALKPHOS 103  BILITOT 0.6  PROT 6.3*  ALBUMIN 3.6   CBG: Recent Labs  Lab 05/04/19 1157 05/04/19 1703 05/04/19 2154 05/05/19 0806 05/05/19 1153  GLUCAP 173* 215* 284* 169* 252*   Sepsis Labs: Recent Labs  Lab 05/02/19 0500 05/03/19 0309  PROCALCITON <0.10 <0.10      Scheduled Meds: . aspirin  81 mg Oral Daily  . atorvastatin  40 mg Oral q1800  . citalopram  20 mg Oral Daily  . febuxostat  40 mg Oral Daily  . furosemide  40 mg Oral Daily  . heparin  5,000 Units Subcutaneous Q8H  . hydrALAZINE  50 mg Oral Q6H  . insulin aspart  0-9 Units Subcutaneous TID WC  . insulin glargine  25 Units Subcutaneous QHS  . isosorbide mononitrate  60 mg Oral Daily  . lisinopril  2.5 mg Oral Daily  . metoprolol tartrate  75 mg Oral BID  . pantoprazole  40 mg Oral Daily  . predniSONE  40 mg Oral Q breakfast   Continuous Infusions: . azithromycin 500 mg (05/05/19 0028)  . cefTRIAXone (ROCEPHIN)  IV 1 g (05/04/19 2228)    Assessment & Plan:   Active Problems:   Acute kidney injury superimposed on chronic kidney disease (HCC)   Type 2 diabetes mellitus with hyperglycemia, with long-term current  use of insulin (HCC)   Hypoxia   Acute hypoxia secondary to questionable pneumonia vs worsening ILD - tx presumptively for pneumonia.  Clinically improving wound off oxygen Will need to ambulate without oxygen to see where her O2 status  Continue IV Rocephin and azithromycin.  Finish total 5 days of course Taper prednisone 30 mg daily, he was on 40 mg till now PPI for GI prophylaxis  Paroxysmal atrial fibrillation - off Coumadin since hx of subdural hematoma  - continue metoprolol Monitor heart rate  Weakness of LE - PT-rec. SNF -OT recommends SNF  Chronic diastolic heart failure - have mild lower extremity edema but no significant fluid overload - continue Lasix, Imdur, increase the dose of hydralazine Monitor volume status  Acute on chronic kidney disease stage III - creatinine of 2.02 here - give 500cc bolus, now creatinine at baseline - avoid nephrotoxic agent  Type 2 diabetes - normally on 23 units of Lantus at home  Lantus was from 20 to 25 units nightly and sliding scale Since IV steroids switched to p.o. steroid BG improving Continue to monitor   Hypertension -Still has room for improvement, will increase hydralazine to 50 mg 4 times daily  -Continue Imdur and Lasix Adjust as needed  Hyperlipidemia Statin  DVT prophylaxis: Heparin Code Status: DNR Family Communication: None at bedside Disposition Plan: None prefers for him to go back to his assisted living with home health and private caregivers 24/7.  I have messaged TOC team and waiting to hear back if he could go back to his facility. ambulate off o2         LOS: 4 days   Time spent: 25 minutes with more than 50% COC    Max Sane, MD [facility] Pager [phone]  If 7PM-7AM, please contact night-coverage [email or web address] Password [email or web address] 05/05/2019, 2:12 PM Patient ID: Justin Leach, male   DOB: [birth year], 85 y.o.   MRN: [id number] Patient ID: Justin Leach, male   DOB:  Sep 23, 1931, 85 y.o.   MRN: [id number]

## 2019-05-05 NOTE — Progress Notes (Signed)
Inpatient Diabetes Program Recommendations  AACE/ADA: New Consensus Statement on Inpatient Glycemic Control   Target Ranges:  Prepandial:   less than 140 mg/dL      Peak postprandial:   less than 180 mg/dL (1-2 hours)      Critically ill patients:  140 - 180 mg/dL   Results for SCHAFER, STUDER (MRN [id number]) as of 05/05/2019 10:55  Ref. Range 05/04/2019 07:33 05/04/2019 11:57 05/04/2019 17:03 05/04/2019 21:54 05/05/2019 08:06  Glucose-Capillary Latest Ref Range: 70 - 99 mg/dL 184 (H) 173 (H) 215 (H) 284 (H) 169 (H)   Review of Glycemic Control  Outpatient Diabetes medications: Lantus 23 units QHS, Admelog 8 units TID with meals, Admelog 0-12 units TID per correction scale Current orders for Inpatient glycemic control: Lantus 25 units QHS, Novolog 0-9 units TID with meals; Prednisone 40 mg QAM  Inpatient Diabetes Program Recommendations:   Insulin-Meal Coverage: If steroids are continued, please consider ordering Novolog 3 units TID with meals for meal coverage if patient eats at least 50% of meals.  Thanks, Barnie Alderman, RN, MSN, CDE Diabetes Coordinator Inpatient Diabetes Program [phone] (Team Pager from 8am to 5pm)

## 2019-05-06 LAB — GLUCOSE, CAPILLARY
Glucose-Capillary: 108 mg/dL — ABNORMAL HIGH (ref 70–99)
Glucose-Capillary: 45 mg/dL — ABNORMAL LOW (ref 70–99)
Glucose-Capillary: 134 mg/dL — ABNORMAL HIGH (ref 70–99)
Glucose-Capillary: 204 mg/dL — ABNORMAL HIGH (ref 70–99)
Glucose-Capillary: 382 mg/dL — ABNORMAL HIGH (ref 70–99)

## 2019-05-06 LAB — CBC
HCT: 35.8 % — ABNORMAL LOW (ref 39.0–52.0)
Hemoglobin: 12.1 g/dL — ABNORMAL LOW (ref 13.0–17.0)
MCH: 30.7 pg (ref 26.0–34.0)
MCHC: 33.8 g/dL (ref 30.0–36.0)
MCV: 90.9 fL (ref 80.0–100.0)
Platelets: 188 10*3/uL (ref 150–400)
RBC: 3.94 MIL/uL — ABNORMAL LOW (ref 4.22–5.81)
RDW: 14 % (ref 11.5–15.5)
WBC: 11.7 10*3/uL — ABNORMAL HIGH (ref 4.0–10.5)
nRBC: 0 % (ref 0.0–0.2)

## 2019-05-06 LAB — BASIC METABOLIC PANEL
Anion gap: 9 (ref 5–15)
BUN: 61 mg/dL — ABNORMAL HIGH (ref 8–23)
CO2: 31 mmol/L (ref 22–32)
Calcium: 9.1 mg/dL (ref 8.9–10.3)
Chloride: 99 mmol/L (ref 98–111)
Creatinine, Ser: 1.86 mg/dL — ABNORMAL HIGH (ref 0.61–1.24)
GFR calc Af Amer: 37 mL/min — ABNORMAL LOW (ref >60)
GFR calc non Af Amer: 32 mL/min — ABNORMAL LOW (ref >60)
Glucose, Bld: 105 mg/dL — ABNORMAL HIGH (ref 70–99)
Potassium: 4 mmol/L (ref 3.5–5.1)
Sodium: 139 mmol/L (ref 135–145)

## 2019-05-06 MED ORDER — POLYETHYLENE GLYCOL 3350 17 G PO PACK
17.0000 g | PACK | Freq: Every day | ORAL | Status: DC
  Administered 2019-05-06 – 2019-05-07 (×2): 17 g via ORAL
  Filled 2019-05-06 (×2): qty 1

## 2019-05-06 MED ORDER — ACETAMINOPHEN 325 MG PO TABS
650.0000 mg | ORAL_TABLET | Freq: Four times a day (QID) | ORAL | Status: DC | PRN
  Administered 2019-05-06: 650 mg via ORAL
  Filled 2019-05-06: qty 2

## 2019-05-06 MED ORDER — LISINOPRIL 5 MG PO TABS
5.0000 mg | ORAL_TABLET | Freq: Every day | ORAL | Status: DC
  Administered 2019-05-06 – 2019-05-08 (×3): 5 mg via ORAL
  Filled 2019-05-06 (×3): qty 1

## 2019-05-06 MED ORDER — SENNOSIDES-DOCUSATE SODIUM 8.6-50 MG PO TABS
1.0000 | ORAL_TABLET | Freq: Two times a day (BID) | ORAL | Status: DC
  Administered 2019-05-06 – 2019-05-07 (×4): 1 via ORAL
  Filled 2019-05-06 (×4): qty 1

## 2019-05-06 MED ORDER — INSULIN DETEMIR 100 UNIT/ML ~~LOC~~ SOLN
10.0000 [IU] | Freq: Every day | SUBCUTANEOUS | Status: DC
  Administered 2019-05-06 – 2019-05-07 (×2): 10 [IU] via SUBCUTANEOUS
  Filled 2019-05-06 (×3): qty 0.1

## 2019-05-06 NOTE — TOC Progression Note (Signed)
Transition of Care [facility]) - Progression Note    Patient Details  Name: Justin Leach MRN: [id number] Date of Birth: 18-Jun-1931  Transition [facility]) CM/SW [place], RN Phone Number: 05/06/2019, 1:03 PM  Clinical Narrative:    I spoke with the patient's nephew who is the POA and reviewed the bed offers in place, he asked if I have a bed offer from [facility] I explained that an offer from there has not came thru but I would reach out and see if they could make an offer. I reached out to [place] and requested them to look at the patient for a bed offer and she stated that she would,  I provided the nephew with my number and he will touch base with other family and let me know what they decide   Expected Discharge Plan: Assisted Living Barriers to Discharge: Continued Medical Work up  Expected Discharge Plan and Services Expected Discharge Plan: Assisted Living In-house Referral: Clinical Social Work   Post Acute Care Choice: [place] arrangements for the past 2 months: [place]: [facility]         Social Determinants of Health (SDOH) Interventions    Readmission Risk Interventions Readmission Risk Prevention Plan 05/03/2019  Transportation Screening Complete  HRI or Home Care Consult Not Complete  HRI or Home Care Consult comments Pt going to SNF rehab  Social Work Consult for [place] Planning/Counseling [place] Not Applicable  Medication Review Press photographer) Complete  Some recent data might be hidden

## 2019-05-06 NOTE — Progress Notes (Signed)
PROGRESS NOTE    Justin Leach  [id number] DOB: [birth year] DOA: 05/01/2019 PCP: Alvester Morin, MD    Brief Narrative:  Justin Leach is a 85 y.o. male with medical history significant of interstitial lung disease not on chronic oxygen, paroxysmal atrial fibrillation, hx of subdural hematoma, chronic diastolic heart failure, CKD stage III, type 2 diabetes, hypertension who presents with concerns of increasing shortness of breath.   Patient is alert and oriented to self and location only and was unable to provide any history.  Reportedly per EMS patient was recently diagnosed with pneumonia and started to have worsening shortness of breath.  He was found to be hypoxic down to about 80% on room air and had improvement on 3 L via nasal cannula. CXR shows no acute pulmonary process.      Consultants:   None  Procedures: CT  Antimicrobials:   Ceftriaxone azithromycin   Subjective: Continues to have fatigue and tiredness but no nausea no vomiting.  No chest pain abdominal pain.  Passing gas.  Objective: Vitals:   05/06/19 0747 05/06/19 1147 05/06/19 1531 05/06/19 1716  BP: (!) 159/55 (!) 147/53 (!) 160/53 (!) 152/55  Pulse: (!) 47  (!) 56   Resp: 18  18   Temp: 98.6 F (37 C)  98.2 F (36.8 C)   TempSrc: Oral  Oral   SpO2: 94%  93%   Weight:      Height:        Intake/Output Summary (Last 24 hours) at 05/06/2019 1904 Last data filed at 05/06/2019 1850 Gross per 24 hour  Intake 240 ml  Output 800 ml  Net -560 ml   Filed Weights   05/01/19 2026  Weight: 79.4 kg    Examination:  General exam: Appears calm and comfortable , interactive today , NAD  respiratory system: increase exp. Time, no wheezing or rhonchi Cardiovascular system: S1 & S2 heard, RRR. No murmurs, rubs, gallops or clicks.  Gastrointestinal system: Abdomen is nondistended, soft and nontender. Normal bowel sounds heard.  No guarding/rebound Central nervous system: Alert and  oriented. No focal neurological deficits. Extremities: mild edema b/l Psychiatry:  Mood & affect appropriate in current setting    Data Reviewed: I have personally reviewed following labs and imaging studies  CBC: Recent Labs  Lab 05/01/19 2049 05/02/19 0500 05/06/19 0334  WBC 11.3* 9.0 11.7*  NEUTROABS 7.5  --   --   HGB 13.6 13.4 12.1*  HCT 42.1 43.0 35.8*  MCV 94.6 98.4 90.9  PLT 232 188 0000000   Basic Metabolic Panel: Recent Labs  Lab 05/01/19 2049 05/02/19 0500 05/03/19 0759 05/05/19 0236 05/06/19 0334  NA 141 139 141 137 139  K 5.0 5.1 4.9 4.6 4.0  CL 101 102 102 97* 99  CO2 32 29 28 29 31   GLUCOSE 237* 243* 299* 278* 105*  BUN 44* 43* 52* 63* 61*  CREATININE 2.33* 1.95* 2.01* 2.02* 1.86*  CALCIUM 9.3 8.9 9.0 8.6* 9.1   GFR: Estimated Creatinine Clearance: 28.7 mL/min (A) (by C-G formula based on SCr of 1.86 mg/dL (H)). Liver Function Tests: Recent Labs  Lab 05/01/19 2049  AST 24  ALT 18  ALKPHOS 103  BILITOT 0.6  PROT 6.3*  ALBUMIN 3.6   CBG: Recent Labs  Lab 05/06/19 0745 05/06/19 0746 05/06/19 0824 05/06/19 1210 05/06/19 1658  GLUCAP 45* 42* 108* 134* 204*   Sepsis Labs: Recent Labs  Lab 05/02/19 0500 05/03/19 0309  PROCALCITON <0.10 <0.10  Scheduled Meds:  aspirin  81 mg Oral Daily   atorvastatin  40 mg Oral q1800   citalopram  20 mg Oral Daily   febuxostat  40 mg Oral Daily   heparin  5,000 Units Subcutaneous Q8H   hydrALAZINE  50 mg Oral Q6H   insulin aspart  0-9 Units Subcutaneous TID WC   insulin detemir  10 Units Subcutaneous QHS   isosorbide mononitrate  60 mg Oral Daily   lisinopril  5 mg Oral Daily   metoprolol tartrate  75 mg Oral BID   pantoprazole  40 mg Oral Daily   polyethylene glycol  17 g Oral Daily   predniSONE  30 mg Oral Q breakfast   senna-docusate  1 tablet Oral BID   Continuous Infusions:   Assessment & Plan:   Active Problems:   Acute kidney injury superimposed on chronic  kidney disease (HCC)   Type 2 diabetes mellitus with hyperglycemia, with long-term current use of insulin (HCC)   Hypoxia   Acute hypoxia secondary to questionable pneumonia vs worsening ILD - tx presumptively for pneumonia.  Clinically improving wound off oxygen Will need to ambulate without oxygen to see where her O2 status  Continue IV Rocephin and azithromycin.  Finish total 5 days of course Taper prednisone 30 mg daily, he was on 40 mg till now PPI for GI prophylaxis  Paroxysmal atrial fibrillation - off Coumadin since hx of subdural hematoma  - continue metoprolol Monitor heart rate  Weakness of LE - PT-rec. SNF -OT recommends SNF  Chronic diastolic heart failure - have mild lower extremity edema but no significant fluid overload - continue Lasix, Imdur, increase the dose of hydralazine Monitor volume status continue lisinopril  Acute on chronic kidney disease stage III - creatinine of 2.02 here -Holding Lasix - avoid nephrotoxic agent  Type 2 diabetes uncontrolled with hyper and hypoglycemia - normally on 23 units of Lantus at home  Lantus was from 20 to 25 units nightly and sliding scale Since IV steroids switched to p.o. steroid BG are low.  We will reduce the dose of the Levemir  Hypertension -Still has room for improvement, will increase hydralazine to 50 mg 4 times daily  -Continue Imdur and Lasix Adjust as needed  Hyperlipidemia Statin  DVT prophylaxis: Heparin Code Status: DNR Family Communication: None at bedside Disposition Plan: Likely to SNF tomorrow        LOS: 5 days   Time spent: 25 minutes with more than 50% COC    Berle Mull, MD [facility]  If 7PM-7AM, please contact night-coverage [email or web address] Password [facility] 05/06/2019, 7:04 PM Patient ID: Justin Leach, male   DOB: January 24, 1932, 85 y.o.   MRN: [id number] Patient ID: Justin Leach, male   DOB: 1931-12-07, 85 y.o.   MRN: [id number]

## 2019-05-06 NOTE — TOC Progression Note (Signed)
Transition of Care [facility]) - Progression Note    Patient Details  Name: Justin Leach MRN: [id number] Date of Birth: 1931-09-05  Transition of Care [facility]) CM/SW Contact  Su Hilt, RN Phone Number: 05/06/2019, 9:09 AM  Clinical Narrative:    Damaris Schooner to Nurse at [facility], Will fax clinical information to [phone] so Nanine Means can review, Plan to DC today as long as BS are stable   Expected Discharge Plan: Assisted Living Barriers to Discharge: Continued Medical Work up  Expected Discharge Plan and Services Expected Discharge Plan: Assisted Living In-house Referral: Clinical Social Work   Post Acute Care Choice: [place] arrangements for the past 2 months: [place]                             [place]: [facility]         Social Determinants of Health (SDOH) Interventions    Readmission Risk Interventions Readmission Risk Prevention Plan 05/03/2019  Transportation Screening Complete  HRI or Home Care Consult Not Complete  HRI or Home Care Consult comments Pt going to SNF rehab  Social Work Consult for [place] Planning/Counseling [place] Not Applicable  Medication Review (RN Care Manager) Complete  Some recent data might be hidden

## 2019-05-06 NOTE — TOC Progression Note (Signed)
Transition of Care [facility]) - Progression Note    Patient Details  Name: SLAYDE DOFFLEMYER MRN: [id number] Date of Birth: 05-09-1932  Transition of Care [facility]) CM/SW Contact  Su Hilt, RN Phone Number: 05/06/2019, 1:26 PM  Clinical Narrative:     Damaris Schooner to Jacqulynn Cadet, he called back and Stated that they would accept the bed offer from [facility] in [place].  I notified Doug with the Norlene Campbell  Expected Discharge Plan: Assisted Living Barriers to Discharge: Continued Medical Work up  Expected Discharge Plan and Services Expected Discharge Plan: Assisted Living In-house Referral: Clinical Social Work   Post Acute Care Choice: [place] arrangements for the past 2 months: [place]                             [place]: [facility]         Social Determinants of Health (SDOH) Interventions    Readmission Risk Interventions Readmission Risk Prevention Plan 05/03/2019  Transportation Screening Complete  HRI or Home Care Consult Not Complete  HRI or Home Care Consult comments Pt going to SNF rehab  Social Work Consult for [place] Planning/Counseling [place] Not Applicable  Medication Review Press photographer) Complete  Some recent data might be hidden

## 2019-05-06 NOTE — NC FL2 (Addendum)
[place] LEVEL OF CARE SCREENING TOOL     IDENTIFICATION  Patient Name: Justin Leach Birthdate: 10-06-1931 Sex: male Admission Date (Current Location): 05/01/2019  [place] and [place] Number:  Engineering geologist and Address:  [facility], [address] [place], [place] [address]      Provider Number: [id number]  Attending Physician Name and Address:  Lavina Hamman, MD  Relative Name and Phone Number:  Lyman Bishop [phone]    Current Level of Care: Hospital Recommended Level of Care: [place] Prior Approval Number:  [id number]  Date Approved/Denied:   PASRR Number:    Discharge Plan: Other (Comment)(ALF)    Current Diagnoses: Patient Active Problem List   Diagnosis Date Noted  . Hypoxia 05/01/2019  . Interstitial pulmonary disease ([place]) 07/29/2018  . Pneumonia 07/20/2018  . Palliative care encounter 06/26/2018  . Weakness generalized 06/26/2018  . HCAP (healthcare-associated pneumonia) 06/22/2018  . Gouty arthritis of right great toe 06/05/2018  . Hypertensive heart and kidney disease with chronic diastolic congestive heart failure and stage 3 chronic kidney disease ([place]) 06/01/2018  . Type 2 diabetes mellitus with hyperglycemia, with long-term current use of insulin ([place]) 06/01/2018  . Chronic constipation 06/01/2018  . CKD stage 3 due to type 2 diabetes mellitus ([place]) 06/01/2018  . Volume overload 05/20/2018  . Fall 05/13/2018  . SDH (subdural hematoma) ([place]) 05/13/2018  . Closed comminuted left humeral fracture 05/13/2018  . Hyperkalemia 05/13/2018  . Leukocytosis 05/13/2018  . Fall at home   . Subdural hematoma without coma ([place])   . Cough   . GERD (gastroesophageal reflux disease)   . Pulmonary fibrosis ([place])   . Coronary artery calcification seen on CT scan   . Chronic diastolic CHF (congestive heart failure) ([place])   . PAF (paroxysmal atrial fibrillation) ([place])   . CKD (chronic  kidney disease), stage III ([place])   . Type II diabetes mellitus with renal manifestations ([place])   . Essential hypertension   . Chest pain 02/28/2015  . Acute kidney injury superimposed on chronic kidney disease ([place]) 02/28/2015  . Hyperlipidemia 02/28/2015  . Dyspnea 03/29/2014  . DOE (dyspnea on exertion) 03/29/2014  . Bradycardia 03/29/2014  . Bronchospasm 05/15/2013    Orientation RESPIRATION BLADDER Height & Weight     Self, Place, Situation    Continent Weight: 79.4 kg Height:  5\' 6"  (167.6 cm)  BEHAVIORAL SYMPTOMS/MOOD NEUROLOGICAL BOWEL NUTRITION STATUS      Continent    AMBULATORY STATUS COMMUNICATION OF NEEDS Skin   Extensive Assist Verbally Normal                       Personal Care Assistance Level of Assistance  Bathing, Feeding, Dressing Bathing Assistance: Limited assistance Feeding assistance: Limited assistance Dressing Assistance: Limited assistance     Functional Limitations Info             SPECIAL CARE FACTORS FREQUENCY                       Contractures      Additional Factors Info  Code Status, Allergies, Psychotropic Code Status Info: DNR Allergies Info: Amiodarone, Nifedipine Psychotropic Info: Celexa         Current Medications (05/06/2019):  This is the current hospital active medication list Current Facility-Administered Medications  Medication Dose Route Frequency Provider Last Rate Last Admin  . acetaminophen (TYLENOL) tablet 650 mg  650 mg Oral Q6H  PRN Sharion Settler, NP   650 mg at 05/06/19 0416  . aspirin chewable tablet 81 mg  81 mg Oral Daily Tu, Ching T, DO   81 mg at 05/05/19 [id number]  . atorvastatin (LIPITOR) tablet 40 mg  40 mg Oral q1800 Tu, Ching T, DO   40 mg at 05/05/19 1806  . citalopram (CELEXA) tablet 20 mg  20 mg Oral Daily Tu, Ching T, DO   20 mg at 05/05/19 0837  . febuxostat (ULORIC) tablet 40 mg  40 mg Oral Daily Tu, Ching T, DO   40 mg at 05/05/19 0837  . heparin injection 5,000 Units  5,000 Units  Subcutaneous Q8H Tu, Ching T, DO   5,000 Units at 05/06/19 0415  . hydrALAZINE (APRESOLINE) tablet 50 mg  50 mg Oral Q6H Max Sane, MD   50 mg at 05/06/19 0416  . insulin aspart (novoLOG) injection 0-9 Units  0-9 Units Subcutaneous TID WC Tu, Ching T, DO   2 Units at 05/05/19 1755  . isosorbide mononitrate (IMDUR) 24 hr tablet 60 mg  60 mg Oral Daily Tu, Ching T, DO   60 mg at 05/05/19 0837  . metoprolol tartrate (LOPRESSOR) tablet 75 mg  75 mg Oral BID Max Sane, MD   75 mg at 05/05/19 2216  . pantoprazole (PROTONIX) EC tablet 40 mg  40 mg Oral Daily Tu, Ching T, DO   40 mg at 05/05/19 0836  . predniSONE (DELTASONE) tablet 30 mg  30 mg Oral Q breakfast Max Sane, MD         Discharge Medications: Please see discharge summary for a list of discharge medications.  Relevant Imaging Results:  Relevant Lab Results:   Additional Information    Su Hilt, RN

## 2019-05-06 NOTE — Progress Notes (Signed)
Inpatient Diabetes Program Recommendations  AACE/ADA: New Consensus Statement on Inpatient Glycemic Control (2015)  Target Ranges:  Prepandial:   less than 140 mg/dL      Peak postprandial:   less than 180 mg/dL (1-2 hours)      Critically ill patients:  140 - 180 mg/dL   Lab Results  Component Value Date   GLUCAP 108 (H) 05/06/2019   HGBA1C 7.2 (H) 05/02/2019    Review of Glycemic Control  Results for DUONG, MESERVEY (MRN [id number]) as of 05/06/2019 10:24  Ref. Range 05/05/2019 17:33 05/05/2019 21:23 05/06/2019 07:45 05/06/2019 07:46 05/06/2019 08:24  Glucose-Capillary Latest Ref Range: 70 - 99 mg/dL 200 (H) 217 (H) 45 (L) 42 (LL) 108 (H)     Inpatient Diabetes Program Recommendations:     -Noted that patient was hypoglycemic this morning 42 mg/dl.  After getting 25 units of Lantus last night.  Lantus was discontinued this morning.  Concerned patient may have hyperglycemia if no basal insulin given.  -Please consider Lantus 12 units QHS (50% of home dose)  Thank you, Geoffry Paradise, RN, BSN Diabetes Coordinator Inpatient Diabetes Program [phone] (team pager from 8a-5p)

## 2019-05-06 NOTE — Progress Notes (Signed)
Physical Therapy Treatment Patient Details Name: Justin Leach MRN: [id number] DOB: 12-15-1931 Today's Date: 05/06/2019    History of Present Illness 85 y.o. male with medical history significant of interstitial lung disease not on chronic oxygen, paroxysmal atrial fibrillation, hx of subdural hematoma, chronic diastolic heart failure, CKD stage III, type 2 diabetes, hypertension who presents with concerns of increasing shortness of breath.  MD assessment includes: Acute hypoxia secondary to questionable pneumonia vs worsening ILD, A-fib, weakness, Chronic diastolic heart failure, acute on chronic kidney disease stage III, DM II, HTN, and HLD.    PT Comments    Pt presented with deficits in strength, transfers, mobility, gait, balance, and activity tolerance.  Pt actively participated throughout the session and motivated to get stronger and walk better but continued to require significant physical assistance with all functional tasks.  Pt presented with posterior lean upon initially sitting up to the EOB but improved throughout the session with anterior weight shifting activities.  Pt was able to stand with assist to prevent posterior LOB and took several small, effortful steps at the EOB before requiring to return to sitting.  Pt will benefit from PT services in a SNF setting upon discharge to safely address above deficits for decreased caregiver assistance and eventual return to PLOF.     Follow Up Recommendations  SNF     Equipment Recommendations  None recommended by PT    Recommendations for Other Services       Precautions / Restrictions Precautions Precautions: Fall Restrictions Weight Bearing Restrictions: No    Mobility  Bed Mobility Overal bed mobility: Needs Assistance Bed Mobility: Rolling;Sit to Sidelying;Sidelying to Sit Rolling: Mod assist Sidelying to sit: Max assist     Sit to sidelying: Max assist General bed mobility comments: Log roll training provided;  pt demonstrated good effort but continued to require mod-max A  Transfers Overall transfer level: Needs assistance Equipment used: Rolling walker (2 wheeled) Transfers: Sit to/from Stand Sit to Stand: Min assist;From elevated surface         General transfer comment: Min A to stand from an elevated surface and to prevent posterior LOB once in standing  Ambulation/Gait Ambulation/Gait assistance: Mod assist Gait Distance (Feet): 2 Feet  Assistive device: Rolling walker (2 wheeled) Gait Pattern/deviations: Step-to pattern;Trunk flexed Gait velocity: decreased   General Gait Details: Pt able to take several effortful steps with poor foot clearance at the EOB before sitting back to the EOB   Stairs             Wheelchair Mobility    Modified Rankin (Stroke Patients Only)       Balance Overall balance assessment: Needs assistance   Sitting balance-Leahy Scale: Fair Sitting balance - Comments: Some posterior lean initially that improved during the session   Standing balance support: Bilateral upper extremity supported Standing balance-Leahy Scale: Poor Standing balance comment: Posterior instability                            Cognition Arousal/Alertness: Awake/alert Behavior During Therapy: WFL for tasks assessed/performed Overall Cognitive Status: No family/caregiver present to determine baseline cognitive functioning                                        Exercises Total Joint Exercises Ankle Circles/Pumps: AROM;Both;10 reps;5 reps Quad Sets: Strengthening;Both;5 reps;10 reps Short Arc Quad: Strengthening;Both;10  reps Heel Slides: AAROM;Both;10 reps Hip ABduction/ADduction: AAROM;Both;10 reps Straight Leg Raises: AAROM;Both;10 reps Long Arc Quad: AROM;Both;10 reps;15 reps Knee Flexion: AROM;Both;10 reps;15 reps Other Exercises Other Exercises: Static and dynamic sitting balance for sitting tolerance and core  strengthening Other Exercises: Anterior weight shifting activities in sitting to address posterior lean Other Exercises: Lateral scooting at the EOB    General Comments        Pertinent Vitals/Pain Pain Assessment: No/denies pain    Home Living                      Prior Function            PT Goals (current goals can now be found in the care plan section) Progress towards PT goals: Progressing toward goals    Frequency    Min 2X/week      PT Plan Current plan remains appropriate    Co-evaluation              AM-PAC PT "6 Clicks" Mobility   Outcome Measure  Help needed turning from your back to your side while in a flat bed without using bedrails?: A Lot Help needed moving from lying on your back to sitting on the side of a flat bed without using bedrails?: A Lot Help needed moving to and from a bed to a chair (including a wheelchair)?: A Lot Help needed standing up from a chair using your arms (e.g., wheelchair or bedside chair)?: A Little Help needed to walk in hospital room?: Total Help needed climbing 3-5 steps with a railing? : Total 6 Click Score: 11    End of Session Equipment Utilized During Treatment: Gait belt Activity Tolerance: Patient tolerated treatment well Patient left: in bed;with call bell/phone within reach;with bed alarm set;with nursing/sitter in room Nurse Communication: Mobility status PT Visit Diagnosis: Unsteadiness on feet (R26.81);Muscle weakness (generalized) (M62.81);Difficulty in walking, not elsewhere classified (R26.2)     Time: 1012-1050 PT Time Calculation (min) (ACUTE ONLY): 38 min  Charges:  $Therapeutic Exercise: 23-37 mins $Therapeutic Activity: 8-22 mins                     D. Scott Daiden Coltrane PT, DPT 05/06/19, 11:34 AM

## 2019-05-06 NOTE — TOC Progression Note (Signed)
Transition of Care [facility]) - Progression Note    Patient Details  Name: Justin Leach MRN: [id number] Date of Birth: Dec 12, 1931  Transition of Care [facility]) CM/SW [place], RN Phone Number: 05/06/2019, 10:07 AM  Clinical Narrative:    Lattie Haw with [facility] called back and stated that the patient is requiring a higher level of care and they will not be able to accept the patient back to [facility].  She is calling the [facility] and having the discussion with him, Bed search to be done   Expected Discharge Plan: Assisted Living Barriers to Discharge: Continued Medical Work up  Expected Discharge Plan and Services Expected Discharge Plan: Assisted Living In-house Referral: Clinical Social Work   Post Acute Care Choice: [place] arrangements for the past 2 months: [place]: [facility]         Social Determinants of Health (SDOH) Interventions    Readmission Risk Interventions Readmission Risk Prevention Plan 05/03/2019  Transportation Screening Complete  HRI or Home Care Consult Not Complete  HRI or Home Care Consult comments Pt going to SNF rehab  Social Work Consult for [place] Planning/Counseling [place] Not Applicable  Medication Review Press photographer) Complete  Some recent data might be hidden

## 2019-05-07 LAB — COMPREHENSIVE METABOLIC PANEL
ALT: 19 U/L (ref 0–44)
AST: 17 U/L (ref 15–41)
Albumin: 2.8 g/dL — ABNORMAL LOW (ref 3.5–5.0)
Alkaline Phosphatase: 72 U/L (ref 38–126)
Anion gap: 9 (ref 5–15)
BUN: 61 mg/dL — ABNORMAL HIGH (ref 8–23)
CO2: 31 mmol/L (ref 22–32)
Calcium: 9.1 mg/dL (ref 8.9–10.3)
Chloride: 99 mmol/L (ref 98–111)
Creatinine, Ser: 1.75 mg/dL — ABNORMAL HIGH (ref 0.61–1.24)
GFR calc Af Amer: 40 mL/min — ABNORMAL LOW (ref >60)
GFR calc non Af Amer: 35 mL/min — ABNORMAL LOW (ref >60)
Glucose, Bld: 231 mg/dL — ABNORMAL HIGH (ref 70–99)
Potassium: 4.4 mmol/L (ref 3.5–5.1)
Sodium: 139 mmol/L (ref 135–145)
Total Bilirubin: 0.6 mg/dL (ref 0.3–1.2)
Total Protein: 5 g/dL — ABNORMAL LOW (ref 6.5–8.1)

## 2019-05-07 LAB — CBC WITH DIFFERENTIAL/PLATELET
Abs Immature Granulocytes: 0.06 10*3/uL (ref 0.00–0.07)
Basophils Absolute: 0 10*3/uL (ref 0.0–0.1)
Basophils Relative: 0 %
Eosinophils Absolute: 0 10*3/uL (ref 0.0–0.5)
Eosinophils Relative: 0 %
HCT: 37.4 % — ABNORMAL LOW (ref 39.0–52.0)
Hemoglobin: 12.7 g/dL — ABNORMAL LOW (ref 13.0–17.0)
Immature Granulocytes: 1 %
Lymphocytes Relative: 8 %
Lymphs Abs: 0.9 10*3/uL (ref 0.7–4.0)
MCH: 30.6 pg (ref 26.0–34.0)
MCHC: 34 g/dL (ref 30.0–36.0)
MCV: 90.1 fL (ref 80.0–100.0)
Monocytes Absolute: 1.1 10*3/uL — ABNORMAL HIGH (ref 0.1–1.0)
Monocytes Relative: 10 %
Neutro Abs: 9.3 10*3/uL — ABNORMAL HIGH (ref 1.7–7.7)
Neutrophils Relative %: 81 %
Platelets: 185 10*3/uL (ref 150–400)
RBC: 4.15 MIL/uL — ABNORMAL LOW (ref 4.22–5.81)
RDW: 13.9 % (ref 11.5–15.5)
WBC: 11.3 10*3/uL — ABNORMAL HIGH (ref 4.0–10.5)
nRBC: 0 % (ref 0.0–0.2)

## 2019-05-07 LAB — GLUCOSE, CAPILLARY
Glucose-Capillary: 109 mg/dL — ABNORMAL HIGH (ref 70–99)
Glucose-Capillary: 102 mg/dL — ABNORMAL HIGH (ref 70–99)
Glucose-Capillary: 172 mg/dL — ABNORMAL HIGH (ref 70–99)
Glucose-Capillary: 219 mg/dL — ABNORMAL HIGH (ref 70–99)

## 2019-05-07 LAB — MAGNESIUM: Magnesium: 2.4 mg/dL (ref 1.7–2.4)

## 2019-05-07 LAB — RESPIRATORY PANEL BY RT PCR (FLU A&B, COVID)
Influenza A by PCR: NEGATIVE
Influenza B by PCR: NEGATIVE
SARS Coronavirus 2 by RT PCR: NEGATIVE

## 2019-05-07 LAB — SARS CORONAVIRUS 2 (TAT 6-24 HRS): SARS Coronavirus 2: NEGATIVE

## 2019-05-07 MED ORDER — METOPROLOL TARTRATE 50 MG PO TABS
50.0000 mg | ORAL_TABLET | Freq: Two times a day (BID) | ORAL | Status: DC
  Administered 2019-05-07 – 2019-05-08 (×3): 50 mg via ORAL
  Filled 2019-05-07 (×3): qty 1

## 2019-05-07 MED ORDER — LISINOPRIL 5 MG PO TABS: 5.0000 mg | ORAL_TABLET | Freq: Every day | ORAL | 0 refills | Status: DC

## 2019-05-07 MED ORDER — LANTUS SOLOSTAR 100 UNIT/ML ~~LOC~~ SOPN: 10.0000 [IU] | PEN_INJECTOR | Freq: Every day | SUBCUTANEOUS | 0 refills | Status: DC

## 2019-05-07 MED ORDER — METOPROLOL TARTRATE 50 MG PO TABS: 50.0000 mg | ORAL_TABLET | Freq: Two times a day (BID) | ORAL | 0 refills | Status: AC

## 2019-05-07 MED ORDER — PREDNISONE 20 MG PO TABS
20.0000 mg | ORAL_TABLET | Freq: Every day | ORAL | Status: DC
  Administered 2019-05-08: 20 mg via ORAL
  Filled 2019-05-07: qty 1

## 2019-05-07 MED ORDER — HYDRALAZINE HCL 100 MG PO TABS: 100.0000 mg | ORAL_TABLET | Freq: Three times a day (TID) | ORAL | 0 refills | Status: AC

## 2019-05-07 MED ORDER — FUROSEMIDE 20 MG PO TABS: 20.0000 mg | ORAL_TABLET | Freq: Every day | ORAL | 0 refills | Status: DC | PRN

## 2019-05-07 MED ORDER — PREDNISONE 10 MG PO TABS: ORAL_TABLET | ORAL | 0 refills | Status: DC

## 2019-05-07 MED ORDER — CITALOPRAM HYDROBROMIDE 20 MG PO TABS: 20.0000 mg | ORAL_TABLET | Freq: Every day | ORAL | 0 refills | Status: AC

## 2019-05-07 NOTE — TOC Progression Note (Signed)
Transition of Care [facility]) - Progression Note    Patient Details  Name: Justin Leach MRN: [id number] Date of Birth: [birth year]  Transition of Care [facility]) CM/SW [place], RN Phone Number: 05/07/2019, 11:40 AM  Clinical Narrative:    Per Garlon Hatchet at [facility] they will accept the patient up until 7 PM today if Covid test comes back   Expected Discharge Plan: Assisted Living Barriers to Discharge: Barriers Resolved  Expected Discharge Plan and Services Expected Discharge Plan: Assisted Living In-house Referral: Clinical Social Work   Post Acute Care Choice: [place] arrangements for the past 2 months: [place] Expected Discharge Date: 05/07/19                           [facility] Agency: [facility]         Social Determinants of Health (SDOH) Interventions    Readmission Risk Interventions Readmission Risk Prevention Plan 05/03/2019  Transportation Screening Complete  HRI or [place] Not Complete  HRI or Home Care Consult comments Pt going to SNF rehab  Social Work Consult for [place] Planning/Counseling [place] Not Applicable  Medication Review (RN Care Manager) Complete  Some recent data might be hidden

## 2019-05-07 NOTE — Care Management Important Message (Signed)
Important Message  Patient Details  Name: Justin Leach MRN: [id number] Date of Birth: [birth year]   Medicare Important Message Given:  Yes     [name] 05/07/2019, 1:43 PM

## 2019-05-07 NOTE — TOC Transition Note (Signed)
Transition of Care [facility]) - CM/SW Discharge Note   Patient Details  Name: [name] MRN: [id number] Date of Birth: 04-16-1932  Transition of Care [facility]) CM/SW Contact:  Su Hilt, RN Phone Number: 05/07/2019, 10:49 AM   Clinical Narrative:     Patient to go to [facility] in [place] today via EMS to transport, [employer] Nicole Kindred has been made aware, The bedside nurse to call report to [phone] and to call EMS to transport, DC packet on the chart  Final next level of care: Skilled Nursing Facility Barriers to Discharge: Barriers Resolved   Patient Goals and CMS Choice Patient states their goals for this hospitalization and ongoing recovery are:: Return to prior level of function CMS Medicare.gov Compare Post Acute Care list provided to:: Patient Choice offered to / list presented to : Adult Children  Discharge Placement              Patient chooses bed at: [facility] Patient to be transferred to facility by: EMS Name of family member notified: Nicole Kindred Patient and family notified of of transfer: 05/07/19  Discharge Plan and Services In-house Referral: Clinical Social Work   Post Acute Care Choice: [place]: [place] Determinants of Health (SDOH) Interventions     Readmission Risk Interventions Readmission Risk Prevention Plan 05/03/2019  Transportation Screening Complete  HRI or [place] Not Complete  HRI or Home Care Consult comments Pt going to SNF rehab  Social Work Consult for [place] Planning/Counseling [place] Not Applicable  Medication Review Press photographer) Complete  Some recent data might be hidden

## 2019-05-07 NOTE — TOC Progression Note (Signed)
Transition of Care [facility]) - Progression Note    Patient Details  Name: Justin Leach MRN: [id number] Date of Birth: [birth year]  Transition of Care [facility] CM/SW Contact  Su Hilt, RN Phone Number: 05/07/2019, 11:09 AM  Clinical Narrative:     Per Marden Noble at [facility] the patient needs a new Covid test, the Nurse has been made aware and it   Expected Discharge Plan: Assisted Living Barriers to Discharge: Barriers Resolved  Expected Discharge Plan and Services Expected Discharge Plan: Assisted Living In-house Referral: Clinical Social Work   Post Acute Care Choice: [place] arrangements for the past 2 months: [place] Expected Discharge Date: 05/07/19                           [place]: [facility]         Social Determinants of Health (SDOH) Interventions    Readmission Risk Interventions Readmission Risk Prevention Plan 05/03/2019  Transportation Screening Complete  HRI or [place] Not Complete  HRI or Home Care Consult comments Pt going to SNF rehab  Social Work Consult for [place] Planning/Counseling [place] Not Applicable  Medication Review (RN Care Manager) Complete  Some recent data might be hidden

## 2019-05-07 NOTE — Progress Notes (Signed)
[facility] PROGRESS NOTE  Patient: Justin Leach [id number]   PCP: Alvester Morin, MD DOB: 1931-11-29   DOA: 05/01/2019   DOS: 05/07/2019    Unable to discharge the patient as the Covid test is not available and will have to be repeated again.  Discharge canceled.  Author: Berle Mull, MD [facility] 05/07/2019 6:07 PM   If 7PM-7AM, please contact night-coverage at [email or web address]

## 2019-05-07 NOTE — Discharge Summary (Signed)
[facility] Discharge Summary   Patient: Justin Leach [id number]   PCP: Alvester Morin, MD DOB: 10-06-1931   Date of admission: 05/01/2019   Date of discharge:  05/07/2019    Discharge Diagnoses:  Principal diagnosis Acute hypoxia secondary toquestionable pneumonia vs worsening ILD Active Problems:   Acute kidney injury superimposed on chronic kidney disease ([place])   Type 2 diabetes mellitus with hyperglycemia, with long-term current use of insulin ([place])   Hypoxia  Admitted From: home Disposition:  SNF   Recommendations for Outpatient Follow-up:  1. PCP: please follow up ion blood pressure and blood sugar management 2. Follow up LABS/TEST:  Repeat BMP in 1 week   Contact information for follow-up providers    Alvester Morin, MD. Schedule an appointment as soon as possible for a visit in 1 week(s).   Specialty: Family Medicine Contact information: [address] [place] [address] [phone]            Contact information for after-discharge care    [facility] .   Service: Skilled Nursing Contact information: [address] [place] [phone]                 Diet recommendation: Carb modified diet  Activity: The patient is advised to gradually reintroduce usual activities,as tolerated  Discharge Condition: good  Code Status: DNR   History of present illness: As per the H and P dictated on admission, "Justin Leach is a 85 y.o. male with medical history significant of interstitial lung disease not on chronic oxygen, paroxysmal atrial fibrillation, hx of subdural hematoma, chronic diastolic heart failure, CKD stage III, type 2 diabetes, hypertension who presents with concerns of increasing shortness of breath.   Patient is alert and oriented to self and location only and was unable to provide any history.  Reportedly per EMS patient was recently  diagnosed with pneumonia and started to have worsening shortness of breath.  He was found to be hypoxic down to about 80% on room air and had improvement on 3 L via nasal cannula."  Hospital Course:  Summary of his active problems in the hospital is as following. Acute hypoxia secondary toquestionable pneumonia vs worsening ILD txpresumptively for pneumonia. Clinically improving, off oxygen. Treated with IV Rocephin and azithromycin.  Finish total 5 days of course Taper prednisone  PPI for GI prophylaxis  Paroxysmal atrial fibrillation off Coumadin since hx of subdural hematoma  continue metoprolol  Weakness of LE - PT-rec. SNF -OT recommends SNF  Chronic diastolic heart failure - have mild lower extremity edema but no significant fluid overload - continue Lasix PRN - Imdur, increase the dose of hydralazine -Monitor volume status  - added lisinopril  Acute on chronic kidney disease stage III - creatinine stable - PRN Lasix - avoid nephrotoxic agent  Type 2 diabetes uncontrolled with hyper and hypoglycemia - normally on 23 units of Lantus at home - Since IV steroids switched to p.o. steroid BG are low.  We will reduce the dose of the basal insulin  Hypertension -Still has room for improvement, will increase hydralazine to 100 tid  -Continue Imdur and Lasix Adjust as needed  Hyperlipidemia Statin  Patient was seen by physical therapy, who recommended SNF, which was arranged. On the day of the discharge the patient's vitals were stable, and no other acute medical condition were reported by patient. the patient was felt safe to be discharge at SNF with Therapy.  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 Patient subsequently taken off amiodarone.    Nifedipine Other (See Comments)   Reaction: made gums swell up. Pt states that he had to have gum surgery after taking.       Medication List    STOP taking these medications   carbamide peroxide 6.5 % OTIC solution Commonly known as: DEBROX   levofloxacin 500 MG tablet Commonly known as: LEVAQUIN     TAKE these medications   acetaminophen 325 MG tablet Commonly known as: TYLENOL Take 650 mg by mouth every 6 (six) hours as needed for mild pain or moderate pain.   acidophilus Caps capsule Take 1 capsule by mouth 2 (two) times daily.   aspirin 81 MG chewable tablet Chew 1 tablet (81 mg total) by mouth daily.   atorvastatin 40 MG tablet Commonly known as: LIPITOR Take 40 mg by mouth at bedtime.   bisacodyl 10 MG suppository Commonly known as: DULCOLAX Place 1 suppository (10 mg total) rectally daily as needed for moderate constipation.   citalopram 20 MG tablet Commonly known as: CELEXA Take 1 tablet (20 mg total) by mouth daily. Start taking on: May 08, 2019 What changed:   medication strength  how much to take   Dermacloud Crea Apply 1 application topically 3 (three) times daily. (apply to perirectal area)   febuxostat 40 MG tablet Commonly known as: ULORIC Take 40 mg by mouth daily.   furosemide 20 MG tablet Commonly known as: LASIX Take 1 tablet (20 mg total) by mouth daily as needed for fluid or edema. What changed:   when to take this  reasons to take this   hydrALAZINE 100 MG tablet Commonly known as: APRESOLINE Take 1 tablet (100 mg total) by mouth 3 (three) times daily. What changed:   medication strength  how much to take  when to take this   insulin aspart 100 UNIT/ML injection Commonly known as: novoLOG Inject 8 Units into the skin 4 (four) times daily - after meals and at bedtime. Sliding Scale:  200-249: 2u 250-299: 4u 300-349: 6u 350-399: 8u 400-449: 10u 450-499: 12u 500+ Contact physican   isosorbide mononitrate 60 MG  24 hr tablet Commonly known as: IMDUR Take 1 tablet (60 mg total) by mouth daily.   Lantus SoloStar 100 UNIT/ML Solostar Pen Generic drug: Insulin Glargine Inject 10 Units into the skin at bedtime. What changed: how much to take   lisinopril 5 MG tablet Commonly known as: ZESTRIL Take 1 tablet (5 mg total) by mouth daily. Start taking on: May 08, 2019   loperamide 2 MG capsule Commonly known as: IMODIUM Take 2-4 mg by mouth See admin instructions. Take 2 capsules (4mg ) by mouth at onset of symptoms and take 1 capsule (2mg ) by mouth every 8 hours if needed - max 4 capsules in 24 hours   loratadine 10 MG tablet Commonly known as: CLARITIN Take 10 mg by mouth daily.   metoprolol tartrate 50 MG tablet Commonly known as: LOPRESSOR Take 1 tablet (50 mg total) by mouth 2 (two) times daily. What changed:   medication strength  how much to take  additional instructions   multivitamin with minerals Tabs tablet Take 1 tablet by mouth daily.   nitroGLYCERIN 0.4 MG SL tablet Commonly known as: NITROSTAT Place 0.4  mg under the tongue every 5 (five) minutes as needed for chest pain (max 3 doses).   omeprazole 20 MG capsule Commonly known as: PRILOSEC Take 20 mg by mouth daily.   ondansetron 4 MG tablet Commonly known as: ZOFRAN Take 4 mg by mouth every 4 (four) hours as needed for nausea or vomiting.   polyethylene glycol 17 g packet Commonly known as: MIRALAX / GLYCOLAX Take 17 g by mouth daily as needed for mild constipation.   predniSONE 10 MG tablet Commonly known as: DELTASONE Take 20mg  daily for 3days,Take 10mg  daily for 3days, then stop      Allergies  Allergen Reactions  . 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 Patient subsequently taken off amiodarone.  . Nifedipine Other (See Comments)    Reaction: made gums swell up. Pt states that he had to have  gum surgery after taking.    Discharge Instructions    Diet - low sodium heart healthy   Complete by: As directed    Increase activity slowly   Complete by: As directed      Discharge Exam: Filed Weights   05/01/19 2026  Weight: 79.4 kg   Vitals:   05/07/19 0052 05/07/19 0811  BP: (!) 166/61 (!) 188/60  Pulse: (!) 53 (!) 50  Resp: 18 17  Temp: (!) 97.4 F (36.3 C) 98.3 F (36.8 C)  SpO2: 94% 95%   General: Appear in mild distress, no Rash; Oral Mucosa Clear, moist. no Abnormal Mass Or lumps Cardiovascular: S1 and S2 Present, no Murmur, Respiratory: normal respiratory effort, Bilateral Air entry present and Clear to Auscultation, no Crackles, no wheezes Abdomen: Bowel Sound present, Soft and no tenderness, no hernia Extremities: no Pedal edema, no calf tenderness Neurology: alert and oriented to time, place, and person affect appropriate.  The results of significant diagnostics from this hospitalization (including imaging, microbiology, ancillary and laboratory) are listed below for reference.    Significant Diagnostic Studies: DG Chest Port 1 View  Result Date: 05/01/2019 CLINICAL DATA:  Shortness of breath EXAM: PORTABLE CHEST 1 VIEW COMPARISON:  03/20/2019 FINDINGS: Stable cardiomediastinal contours. Mild linear atelectasis in the left lung base. No focal airspace consolidation, pleural effusion, or pneumothorax. Reverse left shoulder arthroplasty. IMPRESSION: Mild linear atelectasis in the left lung base. Electronically Signed   By: Davina Poke M.D.   On: 05/01/2019 22:08    Microbiology: No results found for this or any previous visit (from the past 240 hour(s)).   Labs: CBC: Recent Labs  Lab 05/01/19 2049 05/02/19 0500 05/06/19 0334 05/07/19 0307  WBC 11.3* 9.0 11.7* 11.3*  NEUTROABS 7.5  --   --  9.3*  HGB 13.6 13.4 12.1* 12.7*  HCT 42.1 43.0 35.8* 37.4*  MCV 94.6 98.4 90.9 90.1  PLT 232 188 188 [id number]   Basic Metabolic Panel: Recent Labs  Lab  05/02/19 0500 05/03/19 0759 05/05/19 0236 05/06/19 0334 05/07/19 0307  NA 139 141 137 139 139  K 5.1 4.9 4.6 4.0 4.4  CL 102 102 97* 99 99  CO2 29 28 29 31 31   GLUCOSE 243* 299* 278* 105* 231*  BUN 43* 52* 63* 61* 61*  CREATININE 1.95* 2.01* 2.02* 1.86* 1.75*  CALCIUM 8.9 9.0 8.6* 9.1 9.1  MG  --   --   --   --  2.4   Liver Function Tests: Recent Labs  Lab 05/01/19 2049 05/07/19 [id number]  AST 24 17  ALT 18 19  ALKPHOS 103 72  BILITOT 0.6 0.6  PROT 6.3* 5.0*  ALBUMIN 3.6 2.8*   No results for input(s): LIPASE, AMYLASE in the last 168 hours. No results for input(s): AMMONIA in the last 168 hours. Cardiac Enzymes: No results for input(s): CKTOTAL, CKMB, CKMBINDEX, TROPONINI in the last 168 hours. BNP (last 3 results) Recent Labs    05/27/18 0728 03/20/19 1958 05/01/19 2049  BNP 105.3* 199.0* 142.0*   CBG: Recent Labs  Lab 05/06/19 0824 05/06/19 1210 05/06/19 1658 05/06/19 2105 05/07/19 0812  GLUCAP 108* 134* 204* 382* 109*    Time spent: 35 minutes  Signed:  Berle Mull  [facility]  05/07/2019 10:28 AM

## 2019-05-08 DIAGNOSIS — R0602 Shortness of breath: Secondary | ICD-10-CM

## 2019-05-08 LAB — GLUCOSE, CAPILLARY: Glucose-Capillary: 187 mg/dL — ABNORMAL HIGH (ref 70–99)

## 2019-05-08 NOTE — Discharge Summary (Signed)
[facility] Discharge Summary   Patient: Justin Leach [id number]   PCP: Alvester Morin, MD DOB: Nov 18, 1931   Date of admission: 05/01/2019   Date of discharge:  05/08/2019    Discharge Diagnoses:  Principal diagnosis Acute hypoxia secondary toquestionable pneumonia vs worsening ILD Active Problems:   Acute kidney injury superimposed on chronic kidney disease ([place])   Type 2 diabetes mellitus with hyperglycemia, with long-term current use of insulin ([place])   Hypoxia  Admitted From: home Disposition:  SNF   Recommendations for Outpatient Follow-up:  1. PCP: please follow up ion blood pressure and blood sugar management 2. Follow up LABS/TEST:  Repeat BMP in 1 week   Contact information for follow-up providers    Alvester Morin, MD. Schedule an appointment as soon as possible for a visit in 1 week(s).   Specialty: Family Medicine Contact information: [address] [place] [address] [phone]            Contact information for after-discharge care    [facility] .   Service: Skilled Nursing Contact information: [address] [place] [phone]                 Diet recommendation: Carb modified diet  Activity: The patient is advised to gradually reintroduce usual activities,as tolerated  Discharge Condition: good  Code Status: DNR   History of present illness: As per the H and P dictated on admission, "Justin Leach is a 85 y.o. male with medical history significant of interstitial lung disease not on chronic oxygen, paroxysmal atrial fibrillation, hx of subdural hematoma, chronic diastolic heart failure, CKD stage III, type 2 diabetes, hypertension who presents with concerns of increasing shortness of breath.   Patient is alert and oriented to self and location only and was unable to provide any history.  Reportedly per EMS patient was recently  diagnosed with pneumonia and started to have worsening shortness of breath.  He was found to be hypoxic down to about 80% on room air and had improvement on 3 L via nasal cannula."  Hospital Course:  Summary of his active problems in the hospital is as following. Acute hypoxia secondary toquestionable pneumonia vs worsening ILD txpresumptively for pneumonia. Clinically improving, off oxygen. Treated with IV Rocephin and azithromycin.  Finish total 5 days of course Taper prednisone  PPI for GI prophylaxis  Paroxysmal atrial fibrillation off Coumadin since hx of subdural hematoma  continue metoprolol  Weakness of LE - PT-rec. SNF -OT recommends SNF  Chronic diastolic heart failure - have mild lower extremity edema but no significant fluid overload - continue Lasix PRN - Imdur, increase the dose of hydralazine -Monitor volume status  - added lisinopril  Acute on chronic kidney disease stage III - creatinine stable - PRN Lasix - avoid nephrotoxic agent  Type 2 diabetes uncontrolled with hyper and hypoglycemia - normally on 23 units of Lantus at home - Since IV steroids switched to p.o. steroid BG are low.  We will reduce the dose of the basal insulin  Hypertension -Still has room for improvement, will increase hydralazine to 100 tid  -Continue Imdur and Lasix Adjust as needed  Hyperlipidemia Statin  Patient was seen by physical therapy, who recommended SNF, which was arranged. On the day of the discharge the patient's vitals were stable, and no other acute medical condition were reported by patient. the patient was felt safe to be discharge at SNF with Therapy.  Consultants:  none Procedures: none  DISCHARGE MEDICATION: Allergies as of 05/08/2019      Reactions   Amiodarone Other (See Comments)   MD noted 10/31/15: Chest imaging in January showed new findings concerning for amiodarone toxicity versus NSIP. Patient subsequently taken off amiodarone.    Nifedipine Other (See Comments)   Reaction: made gums swell up. Pt states that he had to have gum surgery after taking.       Medication List    STOP taking these medications   carbamide peroxide 6.5 % OTIC solution Commonly known as: DEBROX   levofloxacin 500 MG tablet Commonly known as: LEVAQUIN     TAKE these medications   acetaminophen 325 MG tablet Commonly known as: TYLENOL Take 650 mg by mouth every 6 (six) hours as needed for mild pain or moderate pain.   aspirin 81 MG chewable tablet Chew 1 tablet (81 mg total) by mouth daily.   atorvastatin 40 MG tablet Commonly known as: LIPITOR Take 40 mg by mouth at bedtime.   bisacodyl 10 MG suppository Commonly known as: DULCOLAX Place 1 suppository (10 mg total) rectally daily as needed for moderate constipation.   citalopram 20 MG tablet Commonly known as: CELEXA Take 1 tablet (20 mg total) by mouth daily. What changed:   medication strength  how much to take   Dermacloud Crea Apply 1 application topically 3 (three) times daily. (apply to perirectal area)   febuxostat 40 MG tablet Commonly known as: ULORIC Take 40 mg by mouth daily.   furosemide 20 MG tablet Commonly known as: LASIX Take 1 tablet (20 mg total) by mouth daily as needed for fluid or edema. What changed:   when to take this  reasons to take this   hydrALAZINE 100 MG tablet Commonly known as: APRESOLINE Take 1 tablet (100 mg total) by mouth 3 (three) times daily. What changed:   medication strength  how much to take  when to take this   insulin aspart 100 UNIT/ML injection Commonly known as: novoLOG Inject 8 Units into the skin 4 (four) times daily - after meals and at bedtime. Sliding Scale:  200-249: 2u 250-299: 4u 300-349: 6u 350-399: 8u 400-449: 10u 450-499: 12u 500+ Contact physican   isosorbide mononitrate 60 MG 24 hr tablet Commonly known as: IMDUR Take 1 tablet (60 mg total) by mouth daily.   Lantus SoloStar 100  UNIT/ML Solostar Pen Generic drug: Insulin Glargine Inject 10 Units into the skin at bedtime. What changed: how much to take   lisinopril 5 MG tablet Commonly known as: ZESTRIL Take 1 tablet (5 mg total) by mouth daily.   loperamide 2 MG capsule Commonly known as: IMODIUM Take 2-4 mg by mouth See admin instructions. Take 2 capsules (4mg ) by mouth at onset of symptoms and take 1 capsule (2mg ) by mouth every 8 hours if needed - max 4 capsules in 24 hours   loratadine 10 MG tablet Commonly known as: CLARITIN Take 10 mg by mouth daily.   metoprolol tartrate 50 MG tablet Commonly known as: LOPRESSOR Take 1 tablet (50 mg total) by mouth 2 (two) times daily. What changed:   medication strength  how much to take  additional instructions   multivitamin with minerals Tabs tablet Take 1 tablet by mouth daily.   nitroGLYCERIN 0.4 MG SL tablet Commonly known as: NITROSTAT Place 0.4 mg under the tongue every 5 (five) minutes as needed for chest pain (max 3 doses).   omeprazole 20 MG capsule Commonly known as: PRILOSEC  Take 20 mg by mouth daily.   ondansetron 4 MG tablet Commonly known as: ZOFRAN Take 4 mg by mouth every 4 (four) hours as needed for nausea or vomiting.   polyethylene glycol 17 g packet Commonly known as: MIRALAX / GLYCOLAX Take 17 g by mouth daily as needed for mild constipation.   predniSONE 10 MG tablet Commonly known as: DELTASONE Take 20mg  daily for 3days,Take 10mg  daily for 3days, then stop     ASK your doctor about these medications   acidophilus Caps capsule Take 1 capsule by mouth 2 (two) times daily. Ask about: Should I take this medication?      Allergies  Allergen Reactions  . 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 Patient subsequently taken off amiodarone.  . Nifedipine Other (See Comments)    Reaction: made gums swell up. Pt states  that he had to have gum surgery after taking.    Discharge Instructions    Diet - low sodium heart healthy   Complete by: As directed    Increase activity slowly   Complete by: As directed      Discharge Exam: Filed Weights   05/01/19 2026  Weight: 79.4 kg   Vitals:   05/08/19 0821 05/08/19 0945  BP: (!) 191/51 (!) 154/50  Pulse: (!) 52   Resp:    Temp: 98.3 F (36.8 C)   SpO2: 95%    General: Appear in mild distress, no Rash; Oral Mucosa Clear, moist. no Abnormal Mass Or lumps Cardiovascular: S1 and S2 Present, no Murmur, Respiratory: normal respiratory effort, Bilateral Air entry present and Clear to Auscultation, no Crackles, no wheezes Abdomen: Bowel Sound present, Soft and no tenderness, no hernia Extremities: no Pedal edema, no calf tenderness Neurology: alert and oriented to time, place, and person affect appropriate.  The results of significant diagnostics from this hospitalization (including imaging, microbiology, ancillary and laboratory) are listed below for reference.    Significant Diagnostic Studies: DG Chest Port 1 View  Result Date: 05/01/2019 CLINICAL DATA:  Shortness of breath EXAM: PORTABLE CHEST 1 VIEW COMPARISON:  03/20/2019 FINDINGS: Stable cardiomediastinal contours. Mild linear atelectasis in the left lung base. No focal airspace consolidation, pleural effusion, or pneumothorax. Reverse left shoulder arthroplasty. IMPRESSION: Mild linear atelectasis in the left lung base. Electronically Signed   By: Davina Poke M.D.   On: 05/01/2019 22:08    Microbiology: Recent Results (from the past 240 hour(s))  SARS CORONAVIRUS 2 (TAT 6-24 HRS) Nasopharyngeal Nasopharyngeal Swab     Status: None   Collection Time: 05/07/19 12:06 PM   Specimen: Nasopharyngeal Swab  Result Value Ref Range Status   SARS Coronavirus 2 NEGATIVE NEGATIVE Final    Comment: (NOTE) SARS-CoV-2 target nucleic acids are NOT DETECTED. The SARS-CoV-2 RNA is generally detectable in  upper and lower respiratory specimens during the acute phase of infection. Negative results do not preclude SARS-CoV-2 infection, do not rule out co-infections with other pathogens, and should not be used as the sole basis for treatment or other patient management decisions. Negative results must be combined with clinical observations, patient history, and epidemiological information. The expected result is Negative. Fact Sheet for Patients: SugarRoll.be Fact Sheet for Healthcare Providers: https://www.woods-mathews.com/ This test is not yet approved or cleared by the [place] FDA and  has been authorized for detection and/or diagnosis of SARS-CoV-2 by FDA under an Emergency Use Authorization (EUA). This EUA will  remain  in effect (meaning this test can be used) for the duration of the COVID-19 declaration under Section 56 4(b)(1) of the Act, 21 U.S.C. section 360bbb-3(b)(1), unless the authorization is terminated or revoked sooner. Performed at [facility], [place] [address] [place], [place] [address]   Respiratory Panel by RT PCR (Flu A&B, Covid) - Nasopharyngeal Swab     Status: None   Collection Time: 05/07/19  6:05 PM   Specimen: Nasopharyngeal Swab  Result Value Ref Range Status   SARS Coronavirus 2 by RT PCR NEGATIVE NEGATIVE Final    Comment: (NOTE) SARS-CoV-2 target nucleic acids are NOT DETECTED. The SARS-CoV-2 RNA is generally detectable in upper respiratoy specimens during the acute phase of infection. The lowest concentration of SARS-CoV-2 viral copies this assay can detect is 131 copies/mL. A negative result does not preclude SARS-Cov-2 infection and should not be used as the sole basis for treatment or other patient management decisions. A negative result may occur with  improper specimen collection/handling, submission of specimen other than nasopharyngeal swab, presence of viral mutation(s) within the areas  targeted by this assay, and inadequate number of viral copies (<131 copies/mL). A negative result must be combined with clinical observations, patient history, and epidemiological information. The expected result is Negative. Fact Sheet for Patients:  PinkCheek.be Fact Sheet for Healthcare Providers:  GravelBags.it This test is not yet ap proved or cleared by the [place] FDA and  has been authorized for detection and/or diagnosis of SARS-CoV-2 by FDA under an Emergency Use Authorization (EUA). This EUA will remain  in effect (meaning this test can be used) for the duration of the COVID-19 declaration under Section 564(b)(1) of the Act, 21 U.S.C. section 360bbb-3(b)(1), unless the authorization is terminated or revoked sooner.    Influenza A by PCR NEGATIVE NEGATIVE Final   Influenza B by PCR NEGATIVE NEGATIVE Final    Comment: (NOTE) The Xpert Xpress SARS-CoV-2/FLU/RSV assay is intended as an aid in  the diagnosis of influenza from Nasopharyngeal swab specimens and  should not be used as a sole basis for treatment. Nasal washings and  aspirates are unacceptable for Xpert Xpress SARS-CoV-2/FLU/RSV  testing. Fact Sheet for Patients: PinkCheek.be Fact Sheet for Healthcare Providers: GravelBags.it This test is not yet approved or cleared by the [place] FDA and  has been authorized for detection and/or diagnosis of SARS-CoV-2 by  FDA under an Emergency Use Authorization (EUA). This EUA will remain  in effect (meaning this test can be used) for the duration of the  Covid-19 declaration under Section 564(b)(1) of the Act, 21  U.S.C. section 360bbb-3(b)(1), unless the authorization is  terminated or revoked. Performed at [facility], [address] [place], [place] [address]      Labs: CBC: Recent Labs  Lab 05/01/19 2049 05/02/19 0500  05/06/19 0334 05/07/19 0307  WBC 11.3* 9.0 11.7* 11.3*  NEUTROABS 7.5  --   --  9.3*  HGB 13.6 13.4 12.1* 12.7*  HCT 42.1 43.0 35.8* 37.4*  MCV 94.6 98.4 90.9 90.1  PLT 232 188 188 [id number]   Basic Metabolic Panel: Recent Labs  Lab 05/02/19 0500 05/03/19 0759 05/05/19 0236 05/06/19 0334 05/07/19 0307  NA 139 141 137 139 139  K 5.1 4.9 4.6 4.0 4.4  CL 102 102 97* 99 99  CO2 29 28 29 31 31   GLUCOSE 243* 299* 278* 105* 231*  BUN 43* 52* 63* 61* 61*  CREATININE 1.95* 2.01* 2.02* 1.86* 1.75*  CALCIUM 8.9  9.0 8.6* 9.1 9.1  MG  --   --   --   --  2.4   Liver Function Tests: Recent Labs  Lab 05/01/19 2049 05/07/19 0307  AST 24 17  ALT 18 19  ALKPHOS 103 72  BILITOT 0.6 0.6  PROT 6.3* 5.0*  ALBUMIN 3.6 2.8*   No results for input(s): LIPASE, AMYLASE in the last 168 hours. No results for input(s): AMMONIA in the last 168 hours. Cardiac Enzymes: No results for input(s): CKTOTAL, CKMB, CKMBINDEX, TROPONINI in the last 168 hours. BNP (last 3 results) Recent Labs    05/27/18 0728 03/20/19 1958 05/01/19 2049  BNP 105.3* 199.0* 142.0*   CBG: Recent Labs  Lab 05/06/19 2105 05/07/19 0812 05/07/19 1231 05/07/19 1650 05/07/19 2116  GLUCAP 382* 109* 102* 172* 219*    Time spent: 35 minutes  Signed:  Berle Mull  [facility]  05/08/2019 11:19 AM

## 2019-05-08 NOTE — Progress Notes (Signed)
Report called to [facility] at [facility]. EMS called for transport.

## 2019-05-08 NOTE — Plan of Care (Signed)

## 2019-05-08 NOTE — Progress Notes (Signed)
Called EMS to confirm transportation for patient to [facility] EMS stated that they called [facility] to transport patient to [facility] in [place].

## 2019-05-08 NOTE — Progress Notes (Signed)
Patient ready for discharge to [facility]. Patient's nephew Pennie Rushing notified of discharge plan via phone [phone] and agreeable. Confirmed bed available with [facility] at [facility]. Patient going to room on 200 hall and report to be called to [phone]. RN Arlyss Repress updated. Patient to be transported via [facility]. Patient packet to be placed on patient chart. D/C summary sent via hub. LCSW signing off as no further needs identified.

## 2019-05-09 LAB — GLUCOSE, CAPILLARY: Glucose-Capillary: 281 mg/dL — ABNORMAL HIGH (ref 70–99)

## 2019-05-10 LAB — GLUCOSE, CAPILLARY
Glucose-Capillary: 42 mg/dL — CL (ref 70–99)
Glucose-Capillary: 347 mg/dL — ABNORMAL HIGH (ref 70–99)

## 2019-05-11 DIAGNOSIS — F329 Major depressive disorder, single episode, unspecified: Secondary | ICD-10-CM | POA: Diagnosis not present

## 2019-05-11 DIAGNOSIS — I5030 Unspecified diastolic (congestive) heart failure: Secondary | ICD-10-CM | POA: Diagnosis not present

## 2019-05-11 DIAGNOSIS — J849 Interstitial pulmonary disease, unspecified: Secondary | ICD-10-CM | POA: Diagnosis not present

## 2019-05-11 DIAGNOSIS — M109 Gout, unspecified: Secondary | ICD-10-CM | POA: Diagnosis not present

## 2019-05-11 LAB — GLUCOSE, CAPILLARY: Glucose-Capillary: 332 mg/dL — ABNORMAL HIGH (ref 70–99)

## 2019-05-20 DIAGNOSIS — E785 Hyperlipidemia, unspecified: Secondary | ICD-10-CM | POA: Diagnosis not present

## 2019-05-20 DIAGNOSIS — M109 Gout, unspecified: Secondary | ICD-10-CM | POA: Diagnosis not present

## 2019-05-20 DIAGNOSIS — I10 Essential (primary) hypertension: Secondary | ICD-10-CM | POA: Diagnosis not present

## 2019-05-20 DIAGNOSIS — I5030 Unspecified diastolic (congestive) heart failure: Secondary | ICD-10-CM | POA: Diagnosis not present

## 2019-05-26 DIAGNOSIS — E1165 Type 2 diabetes mellitus with hyperglycemia: Secondary | ICD-10-CM | POA: Diagnosis not present

## 2019-05-26 DIAGNOSIS — I10 Essential (primary) hypertension: Secondary | ICD-10-CM | POA: Diagnosis not present

## 2019-05-26 DIAGNOSIS — I5030 Unspecified diastolic (congestive) heart failure: Secondary | ICD-10-CM | POA: Diagnosis not present

## 2019-05-26 DIAGNOSIS — I480 Paroxysmal atrial fibrillation: Secondary | ICD-10-CM | POA: Diagnosis not present

## 2019-05-27 DIAGNOSIS — I509 Heart failure, unspecified: Secondary | ICD-10-CM | POA: Diagnosis not present

## 2019-05-27 DIAGNOSIS — E876 Hypokalemia: Secondary | ICD-10-CM | POA: Diagnosis not present

## 2019-05-27 DIAGNOSIS — R634 Abnormal weight loss: Secondary | ICD-10-CM | POA: Diagnosis not present

## 2019-05-27 DIAGNOSIS — I4891 Unspecified atrial fibrillation: Secondary | ICD-10-CM | POA: Diagnosis not present

## 2019-05-28 DIAGNOSIS — R634 Abnormal weight loss: Secondary | ICD-10-CM | POA: Diagnosis not present

## 2019-05-28 DIAGNOSIS — I509 Heart failure, unspecified: Secondary | ICD-10-CM | POA: Diagnosis not present

## 2019-05-28 DIAGNOSIS — I4891 Unspecified atrial fibrillation: Secondary | ICD-10-CM | POA: Diagnosis not present

## 2019-05-28 DIAGNOSIS — E876 Hypokalemia: Secondary | ICD-10-CM | POA: Diagnosis not present

## 2019-05-29 DIAGNOSIS — E785 Hyperlipidemia, unspecified: Secondary | ICD-10-CM | POA: Diagnosis not present

## 2019-05-29 DIAGNOSIS — Z7982 Long term (current) use of aspirin: Secondary | ICD-10-CM | POA: Diagnosis not present

## 2019-05-29 DIAGNOSIS — N183 Chronic kidney disease, stage 3 unspecified: Secondary | ICD-10-CM | POA: Diagnosis not present

## 2019-05-29 DIAGNOSIS — Z794 Long term (current) use of insulin: Secondary | ICD-10-CM | POA: Diagnosis not present

## 2019-05-29 DIAGNOSIS — E1122 Type 2 diabetes mellitus with diabetic chronic kidney disease: Secondary | ICD-10-CM | POA: Diagnosis not present

## 2019-05-29 DIAGNOSIS — I130 Hypertensive heart and chronic kidney disease with heart failure and stage 1 through stage 4 chronic kidney disease, or unspecified chronic kidney disease: Secondary | ICD-10-CM | POA: Diagnosis not present

## 2019-05-29 DIAGNOSIS — F329 Major depressive disorder, single episode, unspecified: Secondary | ICD-10-CM | POA: Diagnosis not present

## 2019-05-29 DIAGNOSIS — J849 Interstitial pulmonary disease, unspecified: Secondary | ICD-10-CM | POA: Diagnosis not present

## 2019-05-29 DIAGNOSIS — E1165 Type 2 diabetes mellitus with hyperglycemia: Secondary | ICD-10-CM | POA: Diagnosis not present

## 2019-05-29 DIAGNOSIS — K219 Gastro-esophageal reflux disease without esophagitis: Secondary | ICD-10-CM | POA: Diagnosis not present

## 2019-05-29 DIAGNOSIS — I480 Paroxysmal atrial fibrillation: Secondary | ICD-10-CM | POA: Diagnosis not present

## 2019-05-29 DIAGNOSIS — I5032 Chronic diastolic (congestive) heart failure: Secondary | ICD-10-CM | POA: Diagnosis not present

## 2019-05-30 ENCOUNTER — Emergency Department: Payer: Medicare Other

## 2019-05-30 ENCOUNTER — Inpatient Hospital Stay
Admission: EM | Admit: 2019-05-30 | Discharge: 2019-06-05 | DRG: 177 | Disposition: A | Discharge status: Skilled Nursing Facility | Payer: Medicare Other | Source: Skilled Nursing Facility | Attending: Internal Medicine | Admitting: Internal Medicine

## 2019-05-30 ENCOUNTER — Other Ambulatory Visit: Payer: Self-pay

## 2019-05-30 DIAGNOSIS — R0902 Hypoxemia: Secondary | ICD-10-CM | POA: Diagnosis not present

## 2019-05-30 DIAGNOSIS — Z961 Presence of intraocular lens: Secondary | ICD-10-CM | POA: Diagnosis present

## 2019-05-30 DIAGNOSIS — Z9842 Cataract extraction status, left eye: Secondary | ICD-10-CM

## 2019-05-30 DIAGNOSIS — K219 Gastro-esophageal reflux disease without esophagitis: Secondary | ICD-10-CM | POA: Diagnosis present

## 2019-05-30 DIAGNOSIS — J1282 Pneumonia due to coronavirus disease 2019: Secondary | ICD-10-CM | POA: Diagnosis present

## 2019-05-30 DIAGNOSIS — E785 Hyperlipidemia, unspecified: Secondary | ICD-10-CM | POA: Diagnosis present

## 2019-05-30 DIAGNOSIS — G9341 Metabolic encephalopathy: Secondary | ICD-10-CM | POA: Diagnosis present

## 2019-05-30 DIAGNOSIS — Z66 Do not resuscitate: Secondary | ICD-10-CM | POA: Diagnosis present

## 2019-05-30 DIAGNOSIS — N183 Chronic kidney disease, stage 3 unspecified: Secondary | ICD-10-CM | POA: Diagnosis present

## 2019-05-30 DIAGNOSIS — Z96612 Presence of left artificial shoulder joint: Secondary | ICD-10-CM | POA: Diagnosis present

## 2019-05-30 DIAGNOSIS — R410 Disorientation, unspecified: Secondary | ICD-10-CM | POA: Diagnosis not present

## 2019-05-30 DIAGNOSIS — Z7982 Long term (current) use of aspirin: Secondary | ICD-10-CM

## 2019-05-30 DIAGNOSIS — I5032 Chronic diastolic (congestive) heart failure: Secondary | ICD-10-CM | POA: Diagnosis present

## 2019-05-30 DIAGNOSIS — I130 Hypertensive heart and chronic kidney disease with heart failure and stage 1 through stage 4 chronic kidney disease, or unspecified chronic kidney disease: Secondary | ICD-10-CM | POA: Diagnosis present

## 2019-05-30 DIAGNOSIS — Z8701 Personal history of pneumonia (recurrent): Secondary | ICD-10-CM

## 2019-05-30 DIAGNOSIS — N179 Acute kidney failure, unspecified: Secondary | ICD-10-CM | POA: Diagnosis present

## 2019-05-30 DIAGNOSIS — Z794 Long term (current) use of insulin: Secondary | ICD-10-CM

## 2019-05-30 DIAGNOSIS — R4182 Altered mental status, unspecified: Secondary | ICD-10-CM | POA: Diagnosis not present

## 2019-05-30 DIAGNOSIS — H9190 Unspecified hearing loss, unspecified ear: Secondary | ICD-10-CM | POA: Diagnosis present

## 2019-05-30 DIAGNOSIS — Z9841 Cataract extraction status, right eye: Secondary | ICD-10-CM

## 2019-05-30 DIAGNOSIS — R404 Transient alteration of awareness: Secondary | ICD-10-CM | POA: Diagnosis not present

## 2019-05-30 DIAGNOSIS — J9601 Acute respiratory failure with hypoxia: Secondary | ICD-10-CM

## 2019-05-30 DIAGNOSIS — Z888 Allergy status to other drugs, medicaments and biological substances status: Secondary | ICD-10-CM

## 2019-05-30 DIAGNOSIS — U071 COVID-19: Principal | ICD-10-CM

## 2019-05-30 DIAGNOSIS — I2510 Atherosclerotic heart disease of native coronary artery without angina pectoris: Secondary | ICD-10-CM | POA: Diagnosis present

## 2019-05-30 DIAGNOSIS — I10 Essential (primary) hypertension: Secondary | ICD-10-CM | POA: Diagnosis not present

## 2019-05-30 DIAGNOSIS — J849 Interstitial pulmonary disease, unspecified: Secondary | ICD-10-CM | POA: Diagnosis present

## 2019-05-30 DIAGNOSIS — R531 Weakness: Secondary | ICD-10-CM | POA: Diagnosis not present

## 2019-05-30 DIAGNOSIS — Z79899 Other long term (current) drug therapy: Secondary | ICD-10-CM

## 2019-05-30 DIAGNOSIS — Z9049 Acquired absence of other specified parts of digestive tract: Secondary | ICD-10-CM

## 2019-05-30 DIAGNOSIS — E1122 Type 2 diabetes mellitus with diabetic chronic kidney disease: Secondary | ICD-10-CM | POA: Diagnosis present

## 2019-05-30 DIAGNOSIS — L8990 Pressure ulcer of unspecified site, unspecified stage: Secondary | ICD-10-CM | POA: Insufficient documentation

## 2019-05-30 DIAGNOSIS — Z833 Family history of diabetes mellitus: Secondary | ICD-10-CM

## 2019-05-30 DIAGNOSIS — I480 Paroxysmal atrial fibrillation: Secondary | ICD-10-CM | POA: Diagnosis present

## 2019-05-30 LAB — URINALYSIS, COMPLETE (UACMP) WITH MICROSCOPIC
Bacteria, UA: NONE SEEN
Bilirubin Urine: NEGATIVE
Glucose, UA: NEGATIVE mg/dL
Hgb urine dipstick: NEGATIVE
Ketones, ur: NEGATIVE mg/dL
Leukocytes,Ua: NEGATIVE
Nitrite: NEGATIVE
Protein, ur: NEGATIVE mg/dL
Specific Gravity, Urine: 1.011 (ref 1.005–1.030)
Squamous Epithelial / HPF: NONE SEEN (ref 0–5)
pH: 5 (ref 5.0–8.0)

## 2019-05-30 LAB — POC SARS CORONAVIRUS 2 AG
SARS Coronavirus 2 Ag: NEGATIVE
SARS Coronavirus 2 Ag: POSITIVE — AB

## 2019-05-30 LAB — COMPREHENSIVE METABOLIC PANEL
ALT: 19 U/L (ref 0–44)
AST: 30 U/L (ref 15–41)
Albumin: 2.9 g/dL — ABNORMAL LOW (ref 3.5–5.0)
Alkaline Phosphatase: 102 U/L (ref 38–126)
Anion gap: 11 (ref 5–15)
BUN: 27 mg/dL — ABNORMAL HIGH (ref 8–23)
CO2: 28 mmol/L (ref 22–32)
Calcium: 8.5 mg/dL — ABNORMAL LOW (ref 8.9–10.3)
Chloride: 102 mmol/L (ref 98–111)
Creatinine, Ser: 1.98 mg/dL — ABNORMAL HIGH (ref 0.61–1.24)
GFR calc Af Amer: 35 mL/min — ABNORMAL LOW (ref >60)
GFR calc non Af Amer: 30 mL/min — ABNORMAL LOW (ref >60)
Glucose, Bld: 92 mg/dL (ref 70–99)
Potassium: 3.7 mmol/L (ref 3.5–5.1)
Sodium: 141 mmol/L (ref 135–145)
Total Bilirubin: 0.7 mg/dL (ref 0.3–1.2)
Total Protein: 5.5 g/dL — ABNORMAL LOW (ref 6.5–8.1)

## 2019-05-30 LAB — CBC
HCT: 39.1 % (ref 39.0–52.0)
Hemoglobin: 12.5 g/dL — ABNORMAL LOW (ref 13.0–17.0)
MCH: 30.6 pg (ref 26.0–34.0)
MCHC: 32 g/dL (ref 30.0–36.0)
MCV: 95.8 fL (ref 80.0–100.0)
Platelets: 216 10*3/uL (ref 150–400)
RBC: 4.08 MIL/uL — ABNORMAL LOW (ref 4.22–5.81)
RDW: 13.5 % (ref 11.5–15.5)
WBC: 7.6 10*3/uL (ref 4.0–10.5)
nRBC: 0 % (ref 0.0–0.2)

## 2019-05-30 LAB — BRAIN NATRIURETIC PEPTIDE: B Natriuretic Peptide: 214 pg/mL — ABNORMAL HIGH (ref 0.0–100.0)

## 2019-05-30 MED ORDER — ONDANSETRON HCL 4 MG/2ML IJ SOLN
4.0000 mg | Freq: Once | INTRAMUSCULAR | Status: AC
  Administered 2019-05-30: 4 mg via INTRAVENOUS
  Filled 2019-05-30: qty 2

## 2019-05-30 NOTE — ED Notes (Addendum)
Pt doesn't respond to speech but opens eyes with shaking pt's leg; Pt able to tell name and birth date with some coaching on the date and reports Trump as president  Pt wearing 2 diapers both clean, triage reports pt was EMS from [facility] with [facility] as [facility]  Pt with +2 pitting edema to lower legs, and noisy respirations, diminished lung sounds  Mask on unknown COVID status  Pt desat to 86% on RA when moved into bed, 2 lpm [place] applied

## 2019-05-30 NOTE — ED Triage Notes (Addendum)
Patient brought in by ems from [facility] per facility patient has been more confused then baseline over the past three days. Patient not answering questions at this time. Patient continues to state I am sick.

## 2019-05-30 NOTE — ED Notes (Signed)
Patient transported to CT 

## 2019-05-30 NOTE — ED Notes (Signed)
POC machine read pt's NEGATIVE result as POSITIVE, correct to POC meter done, Heather, charge and Dr Beather Arbour notified

## 2019-05-30 NOTE — ED Provider Notes (Signed)
[facility] Emergency Department Provider Note   ____________________________________________   First MD Initiated Contact with Patient 05/30/19 2303     (approximate)  I have reviewed the triage vital signs and the nursing notes.   HISTORY  Chief Complaint Altered Mental Status  Level V caveat: Limited by decreased LOC  HPI Justin Leach is a 86 y.o. male brought to the ED via EMS from [place] with a chief complaint of altered mental status.  Staff reports patient has been more confused than baseline over the past 3 days.  Reportedly blood sugar at the facility was in the 40s.  EMS reports blood sugar 114.  To their knowledge, no intervention was given.  Patient only states "I am sick".  History of interstitial lung disease not on chronic oxygen, paroxysmal atrial fibrillation not on anticoagulation, hx of subdural hematoma, chronic diastolic heart failure, CKD stage III, type 2 diabetes, hypertension.  Hospitalized approximately 1 month ago for pneumonia versus ILD with hypoxia.       Past Medical History:  Diagnosis Date   Bradycardia    a. during 02/2015 admission - occasional HR in 40s while being treated for atrial fib.   Cervical disc disease    Chronic diastolic CHF (congestive heart failure) ([place])    a. Dx 02/2015 -  2D echo 03/02/15 showed severe focal basal hypertrophy of the septum, EF 55-60%, mild MR, no effusion.   CKD (chronic kidney disease), stage III    Coronary artery calcification seen on CT scan    a. Nuc 02/2015 was normal.   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 Dx 02/2015 - in and out on tele, rx'd Coumadin and amiodarone.   Pneumonia 2007   Shortness of breath dyspnea    WITH EXERTION   Type II diabetes mellitus ([place])      Patient Active Problem List   Diagnosis Date Noted   Acute respiratory failure with hypoxia ([place]) [id number]   Acute metabolic encephalopathy [id number]   Hypoxia 05/01/2019   Interstitial pulmonary disease ([place]) 07/29/2018   Pneumonia 07/20/2018   Palliative care encounter 06/26/2018   Weakness generalized 06/26/2018   HCAP (healthcare-associated pneumonia) 06/22/2018   Gouty arthritis of right great toe 06/05/2018   Hypertensive heart and kidney disease with chronic diastolic congestive heart failure and stage 3 chronic kidney disease ([place]) 06/01/2018   Type 2 diabetes mellitus with hyperglycemia, with long-term current use of insulin ([place]) 06/01/2018   Chronic constipation 06/01/2018   CKD stage 3 due to type 2 diabetes mellitus ([place]) 06/01/2018   Volume overload 05/20/2018   Fall 05/13/2018   SDH (subdural hematoma) ([place]) 05/13/2018   Closed comminuted left humeral fracture 05/13/2018   Hyperkalemia 05/13/2018   Leukocytosis 05/13/2018   Fall at home    Subdural hematoma without coma (HCC)    Cough    GERD (gastroesophageal reflux disease)    Pulmonary fibrosis (HCC)    Coronary artery calcification seen on CT scan    Chronic diastolic CHF (congestive heart failure) (HCC)    PAF (paroxysmal atrial fibrillation) ([place])    CKD (chronic kidney disease), stage III ([place])    Type II diabetes mellitus with renal manifestations ([place])    Essential hypertension    Chest pain 02/28/2015   Acute kidney  injury superimposed on chronic kidney disease ([place]) 02/28/2015   Hyperlipidemia 02/28/2015   SOB (shortness of breath) 03/29/2014   DOE (dyspnea on exertion) 03/29/2014   Bradycardia 03/29/2014   Bronchospasm 05/15/2013    Past Surgical History:  Procedure Laterality Date   CATARACT EXTRACTION W/ INTRAOCULAR LENS  IMPLANT, BILATERAL Bilateral    CERVICAL DISC SURGERY     "i've had 3 neck ORs; not sure what kind; all thru the back of my  neck"   CHOLECYSTECTOMY     COLONOSCOPY WITH PROPOFOL N/A 01/02/2016   Procedure: COLONOSCOPY WITH PROPOFOL;  Surgeon: [name], MD;  Location: [facility];  Service: Endoscopy;  Laterality: N/A;   EYE SURGERY  1930's   "don't know what for" (05/11/2013)   JOINT REPLACEMENT     bilateral knees, elbows, and shoulders (on 05/11/2013 pt denies all joint replacements"    KNEE ARTHROPLASTY     "had one scope; one open knee OR; not sure which on which side" (05/10/2013)   KNEE ARTHROSCOPY     "had one scope; one open knee OR; not sure which on which side" (05/10/2013)   POSTERIOR FUSION CERVICAL SPINE     REVERSE SHOULDER ARTHROPLASTY Left 05/19/2018   Procedure: REVERSE SHOULDER ARTHROPLASTY;  Surgeon: [name], MD;  Location: [place];  Service: Orthopedics;  Laterality: Left;   ROTATOR CUFF REPAIR Bilateral    VIDEO BRONCHOSCOPY Bilateral 02/01/2016   Procedure: VIDEO BRONCHOSCOPY WITHOUT FLUORO;  Surgeon: Marshell [name], MD;  Location: [facility];  Service: Cardiopulmonary;  Laterality: Bilateral;    Prior to Admission medications   Medication Sig Start Date End Date Taking? Authorizing Provider  acetaminophen (TYLENOL) 325 MG tablet Take 650 mg by mouth every 6 (six) hours as needed for mild pain or moderate pain.  06/11/18   [provider]  aspirin 81 MG chewable tablet Chew 1 tablet (81 mg total) by mouth daily. 07/23/18   Loletha Grayer, MD  atorvastatin (LIPITOR) 40 MG tablet Take 40 mg by mouth at bedtime.  05/29/18   [provider]  bisacodyl (DULCOLAX) 10 MG suppository Place 1 suppository (10 mg total) rectally daily as needed for moderate constipation. 05/27/18   Rai, Vernelle Emerald, MD  citalopram (CELEXA) 20 MG tablet Take 1 tablet (20 mg total) by mouth daily. 05/08/19   Lavina Hamman, MD  febuxostat (ULORIC) 40 MG tablet Take 40 mg by mouth daily. 06/02/18   [provider]  furosemide (LASIX) 20 MG tablet Take 1 tablet (20 mg  total) by mouth daily as needed for fluid or edema. 05/07/19   Lavina Hamman, MD  hydrALAZINE (APRESOLINE) 100 MG tablet Take 1 tablet (100 mg total) by mouth 3 (three) times daily. 05/07/19   Lavina Hamman, MD  Infant Care Products [facility]) CREA Apply 1 application topically 3 (three) times daily. (apply to perirectal area)    [provider]  insulin aspart (NOVOLOG) 100 UNIT/ML injection Inject 8 Units into the skin 4 (four) times daily - after meals and at bedtime. Sliding Scale:  200-249: 2u 250-299: 4u 300-349: 6u 350-399: 8u 400-449: 10u 450-499: 12u 500+ Contact physican    [provider]  isosorbide mononitrate (IMDUR) 60 MG 24 hr tablet Take 1 tablet (60 mg total) by mouth daily. 07/23/18   Loletha Grayer, MD  LANTUS SOLOSTAR 100 UNIT/ML Solostar Pen Inject 10 Units into the skin at bedtime. 05/07/19   Lavina Hamman, MD  lisinopril (ZESTRIL) 5 MG tablet Take 1  tablet (5 mg total) by mouth daily. 05/08/19   Lavina Hamman, MD  loperamide (IMODIUM) 2 MG capsule Take 2-4 mg by mouth See admin instructions. Take 2 capsules (4mg ) by mouth at onset of symptoms and take 1 capsule (2mg ) by mouth every 8 hours if needed - max 4 capsules in 24 hours    [provider]  loratadine (CLARITIN) 10 MG tablet Take 10 mg by mouth daily.    [provider]  metoprolol tartrate (LOPRESSOR) 50 MG tablet Take 1 tablet (50 mg total) by mouth 2 (two) times daily. 05/07/19   Lavina Hamman, MD  Multiple Vitamin (MULTIVITAMIN WITH MINERALS) TABS tablet Take 1 tablet by mouth daily.    [provider]  nitroGLYCERIN (NITROSTAT) 0.4 MG SL tablet Place 0.4 mg under the tongue every 5 (five) minutes as needed for chest pain (max 3 doses).  07/20/18   [provider]  omeprazole (PRILOSEC) 20 MG capsule Take 20 mg by mouth daily.  02/26/13   [provider]  ondansetron (ZOFRAN) 4 MG tablet Take 4 mg by mouth every 4 (four) hours as needed for  nausea or vomiting.    [provider]  polyethylene glycol (MIRALAX / GLYCOLAX) packet Take 17 g by mouth daily as needed for mild constipation. 05/27/18   Rai, Vernelle Emerald, MD  predniSONE (DELTASONE) 10 MG tablet Take 20mg  daily for 3days,Take 10mg  daily for 3days, then stop 05/07/19   Lavina Hamman, MD    Allergies Amiodarone and Nifedipine  Family History  Problem Relation Age of Onset   Diabetes Mellitus II Paternal Grandmother    Heart disease Neg Hx        He does not know his father's history.    Cancer Neg Hx    Diabetes Neg Hx    CAD Neg Hx    Lung disease Neg Hx    Rheumatologic disease Neg Hx     Social History Social History   Tobacco Use   Smoking status: Never Smoker   Smokeless tobacco: Former Systems developer    Types: Chew  Substance Use Topics   Alcohol use: No   Drug use: No    Comment: used chew when I was a teenager per patient     Review of Systems  Constitutional: No fever/chills Eyes: No visual changes. ENT: No sore throat. Cardiovascular: Denies chest pain. Respiratory: Positive for shortness of breath. Gastrointestinal: No abdominal pain.  No nausea, no vomiting.  No diarrhea.  No constipation. Genitourinary: Negative for dysuria. Musculoskeletal: Negative for back pain. Skin: Negative for rash. Neurological: Negative for headaches, focal weakness or numbness.   ____________________________________________   PHYSICAL EXAM:  VITAL SIGNS: ED Triage Vitals  Enc Vitals Group     BP 05/30/19 2208 (!) 118/46     Pulse Rate 05/30/19 2208 61     Resp 05/30/19 2208 (!) 24     Temp 05/30/19 2208 98.1 F (36.7 C)     Temp Source 05/30/19 2208 Oral     SpO2 05/30/19 2208 96 %     Weight 05/30/19 2209 175 lb 0.7 oz (79.4 kg)     Height 05/30/19 2209 5\' 11"  (1.803 m)     Head Circumference --      Peak Flow --      Pain Score --      Pain Loc --      Pain Edu? --      Excl. in [place]? --  Constitutional: Alert. Elderly  appearing and in mild acute distress. Eyes: Conjunctivae are normal. PERRL. EOMI. Head: Atraumatic. Nose: No congestion/rhinnorhea. Mouth/Throat: Mucous membranes are mildly dry.   Neck: No stridor.   Cardiovascular: Normal rate, regular rhythm. Grossly normal heart sounds.  Good peripheral circulation. Respiratory: Increased respiratory effort.  No retractions. Lungs with bibasilar rales. Gastrointestinal: Soft and nontender. No distention. No abdominal bruits. No CVA tenderness. Musculoskeletal: No lower extremity tenderness. 1+ nonpitting BLE edema.  No joint effusions. Neurologic: Alert and oriented to person. Normal speech and language. No gross focal neurologic deficits are appreciated.  Skin:  Skin is warm, dry and intact. No rash noted. Psychiatric: Mood and affect are normal. Speech and behavior are normal.  ____________________________________________   LABS (all labs ordered are listed, but only abnormal results are displayed)  Labs Reviewed  RESPIRATORY PANEL BY RT PCR (FLU A&B, COVID) - Abnormal; Notable for the following components:      Result Value   SARS Coronavirus 2 by RT PCR POSITIVE (*)    All other components within normal limits  COMPREHENSIVE METABOLIC PANEL - Abnormal; Notable for the following components:   BUN 27 (*)    Creatinine, Ser 1.98 (*)    Calcium 8.5 (*)    Total Protein 5.5 (*)    Albumin 2.9 (*)    GFR calc non Af Amer 30 (*)    GFR calc Af Amer 35 (*)    All other components within normal limits  CBC - Abnormal; Notable for the following components:   RBC 4.08 (*)    Hemoglobin 12.5 (*)    All other components within normal limits  URINALYSIS, COMPLETE (UACMP) WITH MICROSCOPIC - Abnormal; Notable for the following components:   Color, Urine YELLOW (*)    APPearance HAZY (*)    All other components within normal limits  BRAIN NATRIURETIC PEPTIDE - Abnormal; Notable for the following components:   B Natriuretic Peptide 214.0 (*)    All  other components within normal limits  CBC - Abnormal; Notable for the following components:   RBC 3.74 (*)    Hemoglobin 11.3 (*)    HCT 36.3 (*)    All other components within normal limits  POC SARS CORONAVIRUS 2 AG - Abnormal; Notable for the following components:   SARS Coronavirus 2 Ag POSITIVE (*)    All other components within normal limits  TROPONIN I (HIGH SENSITIVITY) - Abnormal; Notable for the following components:   Troponin I (High Sensitivity) 63 (*)    All other components within normal limits  CULTURE, BLOOD (ROUTINE X 2)  CULTURE, BLOOD (ROUTINE X 2)  LACTIC ACID, PLASMA  HIV ANTIBODY (ROUTINE TESTING W REFLEX)  CREATININE, SERUM  POC SARS CORONAVIRUS 2 AG -  ED  POC SARS CORONAVIRUS 2 AG  TROPONIN I (HIGH SENSITIVITY)   ____________________________________________  EKG  ED ECG REPORT I, Jaquon Gingerich J, the attending physician, personally viewed and interpreted this ECG.   Date: 05/30/2019  EKG Time: 2226  Rate: 57  Rhythm: normal EKG, normal sinus rhythm  Axis: LAD  Intervals:LVH  ST&T Change: Nonspecific  ____________________________________________  RADIOLOGY  ED MD interpretation: No ICH, chronic atelectasis versus scarring on chest x-ray  Official radiology report(s): CT Head Wo Contrast  Result Date: 05/31/2019 CLINICAL DATA:  Change in mental status EXAM: CT HEAD WITHOUT CONTRAST TECHNIQUE: Contiguous axial images were obtained from the base of the skull through the vertex without intravenous contrast. COMPARISON:  March 20, 2019 FINDINGS: Brain: No  evidence of acute territorial infarction, hemorrhage, hydrocephalus,extra-axial collection or mass lesion/mass effect. There is dilatation the ventricles and sulci consistent with age-related atrophy. Low-attenuation changes in the deep white matter consistent with small vessel ischemia. Vascular: No hyperdense vessel or unexpected calcification. Skull: The skull is intact. No fracture or focal lesion  identified. Sinuses/Orbits: The visualized paranasal sinuses and mastoid air cells are clear. The orbits and globes intact. Other: None IMPRESSION: No acute intracranial abnormality. Findings consistent with age related atrophy and chronic small vessel ischemia Electronically Signed   By: Prudencio Pair M.D.   On: 05/31/2019 00:05   DG Chest Port 1 View  Result Date: 05/30/2019 CLINICAL DATA:  86 year old male with increased confusion and weakness. EXAM: PORTABLE CHEST 1 VIEW COMPARISON:  Portable chest 05/01/2019 and earlier. FINDINGS: Portable AP semi upright views at 2244 hours. Stable lung volumes and mediastinal contours. Visualized tracheal air column is within normal limits. Ventilation has not significantly changed going back to October portable chest radiograph. No pneumothorax, pulmonary edema or definite effusion. Mild atelectasis or scarring at the left lung base is stable. Stable cholecystectomy clips. Left shoulder arthroplasty. Cervical ACDF. No acute osseous abnormality identified. IMPRESSION: Chronic atelectasis or scarring at the left lung base. No acute cardiopulmonary abnormality. Electronically Signed   By: Genevie Ann M.D.   On: 05/30/2019 23:08    ____________________________________________   PROCEDURES  Procedure(s) performed (including Critical Care):  Procedures   ____________________________________________   INITIAL IMPRESSION / ASSESSMENT AND PLAN / ED COURSE  As part of my medical decision making, I reviewed the following data within the [place] notes reviewed and incorporated, Labs reviewed, EKG interpreted, Old chart reviewed, Radiograph reviewed, Discussed with admitting physician and Notes from prior ED visits     TILLMAN INSERRA was evaluated in Emergency Department on 05/31/2019 for the symptoms described in the history of present illness. He was evaluated in the context of the global COVID-19 pandemic, which necessitated  consideration that the patient might be at risk for infection with the SARS-CoV-2 virus that causes COVID-19. Institutional protocols and algorithms that pertain to the evaluation of patients at risk for COVID-19 are in a state of rapid change based on information released by regulatory bodies including the CDC and federal and state organizations. These policies and algorithms were followed during the patient's care in the ED.    86 year old male who presents with decreased mentation and shortness of breath. Differential includes, but is not limited to, viral syndrome, bronchitis including COPD exacerbation, pneumonia, reactive airway disease including asthma, CHF including exacerbation with or without pulmonary/interstitial edema, pneumothorax, ACS, thoracic trauma, and pulmonary embolism.  Labs notable for AKI. Will give judicious fluids. Check rapid Covid antigen, troponin, BNP. Obtain CT head.   Clinical Course as of May 30 420  Sun May 30, 2019  2328 Room air saturation on arrival 87%. Placed on nasal cannula oxygen which brings sats up to 96%.   [JS]  Mon May 31, 2019  0141 Color, Urine(!): YELLOW [LP]  0307 Patient's Covid PCR is POSITIVE.   [JS]    Clinical Course User Index [JS] Paulette Blanch, MD [LP] Stephens November, Student-PA     ____________________________________________   FINAL CLINICAL IMPRESSION(S) / ED DIAGNOSES  Final diagnoses:  Altered mental status, unspecified altered mental status type  Hypoxia  COVID-19     ED Discharge Orders    None       Note:  This document was prepared using Dragon voice  recognition software and may include unintentional dictation errors.   Paulette Blanch, MD 05/31/19 [phone]

## 2019-05-30 NOTE — ED Notes (Addendum)
EMS reports patient more confused and told cbg was in the 40's EMS cbg was 114. Saline loc via 18 g in right antecub. Temp 98.3 (oral),  bp136/78

## 2019-05-31 ENCOUNTER — Other Ambulatory Visit: Payer: Self-pay

## 2019-05-31 ENCOUNTER — Encounter: Payer: Self-pay | Admitting: Internal Medicine

## 2019-05-31 DIAGNOSIS — J9601 Acute respiratory failure with hypoxia: Secondary | ICD-10-CM | POA: Diagnosis not present

## 2019-05-31 DIAGNOSIS — Z9841 Cataract extraction status, right eye: Secondary | ICD-10-CM | POA: Diagnosis not present

## 2019-05-31 DIAGNOSIS — I2510 Atherosclerotic heart disease of native coronary artery without angina pectoris: Secondary | ICD-10-CM | POA: Diagnosis present

## 2019-05-31 DIAGNOSIS — Z7982 Long term (current) use of aspirin: Secondary | ICD-10-CM | POA: Diagnosis not present

## 2019-05-31 DIAGNOSIS — I5030 Unspecified diastolic (congestive) heart failure: Secondary | ICD-10-CM | POA: Diagnosis not present

## 2019-05-31 DIAGNOSIS — R4182 Altered mental status, unspecified: Secondary | ICD-10-CM | POA: Diagnosis not present

## 2019-05-31 DIAGNOSIS — M2550 Pain in unspecified joint: Secondary | ICD-10-CM | POA: Diagnosis not present

## 2019-05-31 DIAGNOSIS — E1122 Type 2 diabetes mellitus with diabetic chronic kidney disease: Secondary | ICD-10-CM | POA: Diagnosis present

## 2019-05-31 DIAGNOSIS — Z9049 Acquired absence of other specified parts of digestive tract: Secondary | ICD-10-CM | POA: Diagnosis not present

## 2019-05-31 DIAGNOSIS — I480 Paroxysmal atrial fibrillation: Secondary | ICD-10-CM | POA: Diagnosis not present

## 2019-05-31 DIAGNOSIS — Z7401 Bed confinement status: Secondary | ICD-10-CM | POA: Diagnosis not present

## 2019-05-31 DIAGNOSIS — U071 COVID-19: Secondary | ICD-10-CM

## 2019-05-31 DIAGNOSIS — N183 Chronic kidney disease, stage 3 unspecified: Secondary | ICD-10-CM | POA: Diagnosis not present

## 2019-05-31 DIAGNOSIS — J849 Interstitial pulmonary disease, unspecified: Secondary | ICD-10-CM

## 2019-05-31 DIAGNOSIS — Z794 Long term (current) use of insulin: Secondary | ICD-10-CM | POA: Diagnosis not present

## 2019-05-31 DIAGNOSIS — K219 Gastro-esophageal reflux disease without esophagitis: Secondary | ICD-10-CM | POA: Diagnosis present

## 2019-05-31 DIAGNOSIS — I10 Essential (primary) hypertension: Secondary | ICD-10-CM | POA: Diagnosis not present

## 2019-05-31 DIAGNOSIS — E1165 Type 2 diabetes mellitus with hyperglycemia: Secondary | ICD-10-CM | POA: Diagnosis not present

## 2019-05-31 DIAGNOSIS — Z96612 Presence of left artificial shoulder joint: Secondary | ICD-10-CM | POA: Diagnosis present

## 2019-05-31 DIAGNOSIS — Z833 Family history of diabetes mellitus: Secondary | ICD-10-CM | POA: Diagnosis not present

## 2019-05-31 DIAGNOSIS — Z9842 Cataract extraction status, left eye: Secondary | ICD-10-CM | POA: Diagnosis not present

## 2019-05-31 DIAGNOSIS — J1282 Pneumonia due to coronavirus disease 2019: Secondary | ICD-10-CM

## 2019-05-31 DIAGNOSIS — Z888 Allergy status to other drugs, medicaments and biological substances status: Secondary | ICD-10-CM | POA: Diagnosis not present

## 2019-05-31 DIAGNOSIS — G9341 Metabolic encephalopathy: Secondary | ICD-10-CM

## 2019-05-31 DIAGNOSIS — E785 Hyperlipidemia, unspecified: Secondary | ICD-10-CM | POA: Diagnosis present

## 2019-05-31 DIAGNOSIS — N179 Acute kidney failure, unspecified: Secondary | ICD-10-CM | POA: Diagnosis not present

## 2019-05-31 DIAGNOSIS — Z66 Do not resuscitate: Secondary | ICD-10-CM | POA: Diagnosis present

## 2019-05-31 DIAGNOSIS — F331 Major depressive disorder, recurrent, moderate: Secondary | ICD-10-CM | POA: Diagnosis not present

## 2019-05-31 DIAGNOSIS — I5032 Chronic diastolic (congestive) heart failure: Secondary | ICD-10-CM | POA: Diagnosis not present

## 2019-05-31 DIAGNOSIS — R0902 Hypoxemia: Secondary | ICD-10-CM | POA: Diagnosis present

## 2019-05-31 DIAGNOSIS — Z79899 Other long term (current) drug therapy: Secondary | ICD-10-CM | POA: Diagnosis not present

## 2019-05-31 DIAGNOSIS — M6281 Muscle weakness (generalized): Secondary | ICD-10-CM | POA: Diagnosis not present

## 2019-05-31 DIAGNOSIS — R410 Disorientation, unspecified: Secondary | ICD-10-CM | POA: Diagnosis not present

## 2019-05-31 DIAGNOSIS — I130 Hypertensive heart and chronic kidney disease with heart failure and stage 1 through stage 4 chronic kidney disease, or unspecified chronic kidney disease: Secondary | ICD-10-CM | POA: Diagnosis present

## 2019-05-31 DIAGNOSIS — Z961 Presence of intraocular lens: Secondary | ICD-10-CM | POA: Diagnosis present

## 2019-05-31 LAB — TROPONIN I (HIGH SENSITIVITY)
Troponin I (High Sensitivity): 63 ng/L — ABNORMAL HIGH (ref <18)
Troponin I (High Sensitivity): 58 ng/L — ABNORMAL HIGH (ref <18)

## 2019-05-31 LAB — CBC
HCT: 36.3 % — ABNORMAL LOW (ref 39.0–52.0)
Hemoglobin: 11.3 g/dL — ABNORMAL LOW (ref 13.0–17.0)
MCH: 30.2 pg (ref 26.0–34.0)
MCHC: 31.1 g/dL (ref 30.0–36.0)
MCV: 97.1 fL (ref 80.0–100.0)
Platelets: 176 10*3/uL (ref 150–400)
RBC: 3.74 MIL/uL — ABNORMAL LOW (ref 4.22–5.81)
RDW: 13.7 % (ref 11.5–15.5)
WBC: 6.4 10*3/uL (ref 4.0–10.5)
nRBC: 0 % (ref 0.0–0.2)

## 2019-05-31 LAB — CREATININE, SERUM
Creatinine, Ser: 2.07 mg/dL — ABNORMAL HIGH (ref 0.61–1.24)
GFR calc Af Amer: 33 mL/min — ABNORMAL LOW (ref >60)
GFR calc non Af Amer: 28 mL/min — ABNORMAL LOW (ref >60)

## 2019-05-31 LAB — RESPIRATORY PANEL BY RT PCR (FLU A&B, COVID)
Influenza A by PCR: NEGATIVE
Influenza B by PCR: NEGATIVE
SARS Coronavirus 2 by RT PCR: POSITIVE — AB

## 2019-05-31 LAB — GLUCOSE, CAPILLARY
Glucose-Capillary: 300 mg/dL — ABNORMAL HIGH (ref 70–99)
Glucose-Capillary: 258 mg/dL — ABNORMAL HIGH (ref 70–99)

## 2019-05-31 LAB — LACTIC ACID, PLASMA: Lactic Acid, Venous: 0.8 mmol/L (ref 0.5–1.9)

## 2019-05-31 LAB — ABO/RH: ABO/RH(D): O NEG

## 2019-05-31 LAB — HIV ANTIBODY (ROUTINE TESTING W REFLEX): HIV Screen 4th Generation wRfx: NONREACTIVE

## 2019-05-31 MED ORDER — HYDRALAZINE HCL 50 MG PO TABS: 50.0000 mg | ORAL_TABLET | Freq: Three times a day (TID) | ORAL | Status: DC

## 2019-05-31 MED ORDER — PANTOPRAZOLE SODIUM 40 MG PO TBEC
40.0000 mg | DELAYED_RELEASE_TABLET | Freq: Every day | ORAL | Status: DC
  Administered 2019-05-31 – 2019-06-05 (×6): 40 mg via ORAL
  Filled 2019-05-31 (×6): qty 1

## 2019-05-31 MED ORDER — HYDRALAZINE HCL 50 MG PO TABS
100.0000 mg | ORAL_TABLET | Freq: Three times a day (TID) | ORAL | Status: DC
  Administered 2019-05-31 – 2019-06-05 (×15): 100 mg via ORAL
  Filled 2019-05-31 (×16): qty 2

## 2019-05-31 MED ORDER — LORATADINE 10 MG PO TABS
10.0000 mg | ORAL_TABLET | Freq: Every day | ORAL | Status: DC
  Administered 2019-05-31 – 2019-06-05 (×6): 10 mg via ORAL
  Filled 2019-05-31 (×7): qty 1

## 2019-05-31 MED ORDER — ZINC SULFATE 220 (50 ZN) MG PO CAPS
220.0000 mg | ORAL_CAPSULE | Freq: Every day | ORAL | Status: DC
  Administered 2019-05-31 – 2019-06-05 (×6): 220 mg via ORAL
  Filled 2019-05-31 (×6): qty 1

## 2019-05-31 MED ORDER — ALBUTEROL SULFATE HFA 108 (90 BASE) MCG/ACT IN AERS
2.0000 | INHALATION_SPRAY | Freq: Four times a day (QID) | RESPIRATORY_TRACT | Status: DC
  Administered 2019-05-31 – 2019-06-05 (×20): 2 via RESPIRATORY_TRACT
  Filled 2019-05-31: qty 6.7

## 2019-05-31 MED ORDER — SODIUM CHLORIDE 0.9 % IV SOLN
500.0000 mg | INTRAVENOUS | Status: DC
  Administered 2019-05-31 – 2019-06-01 (×2): 500 mg via INTRAVENOUS
  Filled 2019-05-31 (×3): qty 500

## 2019-05-31 MED ORDER — INSULIN ASPART 100 UNIT/ML ~~LOC~~ SOLN
0.0000 [IU] | Freq: Every day | SUBCUTANEOUS | Status: DC
  Administered 2019-05-31 – 2019-06-01 (×2): 3 [IU] via SUBCUTANEOUS
  Administered 2019-06-04: 21:00:00 2 [IU] via SUBCUTANEOUS
  Filled 2019-05-31 (×4): qty 1

## 2019-05-31 MED ORDER — ENOXAPARIN SODIUM 30 MG/0.3ML ~~LOC~~ SOLN
30.0000 mg | SUBCUTANEOUS | Status: DC
  Administered 2019-05-31 – 2019-06-01 (×2): 30 mg via SUBCUTANEOUS
  Filled 2019-05-31 (×2): qty 0.3

## 2019-05-31 MED ORDER — ASPIRIN 81 MG PO CHEW
81.0000 mg | CHEWABLE_TABLET | Freq: Every day | ORAL | Status: DC
  Administered 2019-05-31 – 2019-06-05 (×6): 81 mg via ORAL
  Filled 2019-05-31 (×6): qty 1

## 2019-05-31 MED ORDER — FEBUXOSTAT 40 MG PO TABS
40.0000 mg | ORAL_TABLET | Freq: Every day | ORAL | Status: DC
  Administered 2019-05-31 – 2019-06-05 (×6): 40 mg via ORAL
  Filled 2019-05-31 (×6): qty 1

## 2019-05-31 MED ORDER — INSULIN GLARGINE 100 UNIT/ML SOLOSTAR PEN: 10.0000 [IU] | PEN_INJECTOR | Freq: Every day | SUBCUTANEOUS | Status: DC

## 2019-05-31 MED ORDER — ISOSORBIDE MONONITRATE ER 60 MG PO TB24
60.0000 mg | ORAL_TABLET | Freq: Every day | ORAL | Status: DC
  Administered 2019-05-31 – 2019-06-05 (×6): 60 mg via ORAL
  Filled 2019-05-31 (×6): qty 1

## 2019-05-31 MED ORDER — SODIUM CHLORIDE 0.9 % IV SOLN
200.0000 mg | Freq: Once | INTRAVENOUS | Status: AC
  Administered 2019-05-31: 200 mg via INTRAVENOUS
  Filled 2019-05-31: qty 200

## 2019-05-31 MED ORDER — CITALOPRAM HYDROBROMIDE 20 MG PO TABS
20.0000 mg | ORAL_TABLET | Freq: Every day | ORAL | Status: DC
  Administered 2019-05-31 – 2019-06-05 (×6): 20 mg via ORAL
  Filled 2019-05-31 (×6): qty 1

## 2019-05-31 MED ORDER — ATORVASTATIN CALCIUM 20 MG PO TABS
40.0000 mg | ORAL_TABLET | Freq: Every day | ORAL | Status: DC
  Administered 2019-05-31 – 2019-06-04 (×5): 40 mg via ORAL
  Filled 2019-05-31 (×5): qty 2

## 2019-05-31 MED ORDER — SODIUM CHLORIDE 0.9 % IV SOLN
2.0000 g | INTRAVENOUS | Status: DC
  Administered 2019-05-31 – 2019-06-02 (×3): 2 g via INTRAVENOUS
  Filled 2019-05-31 (×2): qty 20
  Filled 2019-05-31: qty 2

## 2019-05-31 MED ORDER — ASCORBIC ACID 500 MG PO TABS
500.0000 mg | ORAL_TABLET | Freq: Every day | ORAL | Status: DC
  Administered 2019-05-31 – 2019-06-05 (×6): 500 mg via ORAL
  Filled 2019-05-31 (×6): qty 1

## 2019-05-31 MED ORDER — INSULIN GLARGINE 100 UNIT/ML ~~LOC~~ SOLN
10.0000 [IU] | Freq: Every day | SUBCUTANEOUS | Status: DC
  Administered 2019-05-31 – 2019-06-01 (×2): 10 [IU] via SUBCUTANEOUS
  Filled 2019-05-31 (×2): qty 0.1

## 2019-05-31 MED ORDER — NITROGLYCERIN 0.4 MG SL SUBL: 0.4000 mg | SUBLINGUAL_TABLET | SUBLINGUAL | Status: DC | PRN

## 2019-05-31 MED ORDER — ADULT MULTIVITAMIN W/MINERALS CH
1.0000 | ORAL_TABLET | Freq: Every day | ORAL | Status: DC
  Administered 2019-06-01 – 2019-06-05 (×5): 1 via ORAL
  Filled 2019-05-31 (×5): qty 1

## 2019-05-31 MED ORDER — INSULIN ASPART 100 UNIT/ML ~~LOC~~ SOLN
0.0000 [IU] | Freq: Three times a day (TID) | SUBCUTANEOUS | Status: DC
  Administered 2019-05-31: 5 [IU] via SUBCUTANEOUS
  Administered 2019-06-01: 7 [IU] via SUBCUTANEOUS
  Administered 2019-06-01: 5 [IU] via SUBCUTANEOUS
  Administered 2019-06-01: 7 [IU] via SUBCUTANEOUS
  Administered 2019-06-02: 09:00:00 2 [IU] via SUBCUTANEOUS
  Administered 2019-06-02: 18:00:00 3 [IU] via SUBCUTANEOUS
  Administered 2019-06-02: 12:00:00 5 [IU] via SUBCUTANEOUS
  Administered 2019-06-03: 18:00:00 2 [IU] via SUBCUTANEOUS
  Administered 2019-06-04: 11:00:00 1 [IU] via SUBCUTANEOUS
  Administered 2019-06-04: 3 [IU] via SUBCUTANEOUS
  Administered 2019-06-05: 10:00:00 2 [IU] via SUBCUTANEOUS
  Administered 2019-06-05: 13:00:00 5 [IU] via SUBCUTANEOUS
  Filled 2019-05-31 (×11): qty 1

## 2019-05-31 MED ORDER — DEXAMETHASONE SODIUM PHOSPHATE 10 MG/ML IJ SOLN
6.0000 mg | INTRAMUSCULAR | Status: DC
  Administered 2019-05-31 – 2019-06-02 (×3): 6 mg via INTRAVENOUS
  Filled 2019-05-31 (×3): qty 0.6
  Filled 2019-05-31: qty 1

## 2019-05-31 MED ORDER — SODIUM CHLORIDE 0.9 % IV SOLN
100.0000 mg | Freq: Every day | INTRAVENOUS | Status: AC
  Administered 2019-06-01 – 2019-06-04 (×4): 100 mg via INTRAVENOUS
  Filled 2019-05-31: qty 20
  Filled 2019-05-31 (×3): qty 100

## 2019-05-31 MED ORDER — GUAIFENESIN-DM 100-10 MG/5ML PO SYRP
10.0000 mL | ORAL_SOLUTION | ORAL | Status: DC | PRN
  Filled 2019-05-31: qty 10

## 2019-05-31 MED ORDER — ENOXAPARIN SODIUM 40 MG/0.4ML ~~LOC~~ SOLN: 40.0000 mg | SUBCUTANEOUS | Status: DC

## 2019-05-31 MED ORDER — POLYETHYLENE GLYCOL 3350 17 G PO PACK: 17.0000 g | PACK | Freq: Every day | ORAL | Status: DC | PRN

## 2019-05-31 MED ORDER — METOPROLOL TARTRATE 25 MG PO TABS
12.5000 mg | ORAL_TABLET | Freq: Two times a day (BID) | ORAL | Status: DC
  Administered 2019-05-31 – 2019-06-04 (×8): 12.5 mg via ORAL
  Filled 2019-05-31 (×8): qty 1

## 2019-05-31 NOTE — ED Notes (Signed)
ED TO INPATIENT HANDOFF REPORT  ED Nurse Name and Phone #: Willene Hatchet Name/Age/Gender Justin Leach 86 y.o. male Room/Bed: ED34A/ED34A  Code Status   Code Status: DNR  Home/SNF/Other Home Patient oriented to: self Is this baseline? No   Triage Complete: Triage complete  Chief Complaint Acute respiratory failure with hypoxia [facility]) [J96.01]  Triage Note Patient brought in by ems from [facility] per facility patient has been more confused then baseline over the past three days. Patient not answering questions at this time. Patient continues to state I am sick.     Allergies Allergies  Allergen Reactions  . 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 Patient subsequently taken off amiodarone.  . Nifedipine Other (See Comments)    Reaction: made gums swell up. Pt states that he had to have gum surgery after taking.     Level of Care/Admitting Diagnosis ED Disposition    ED Disposition Condition Comment   Admit  Hospital Area: [facility] [[id number]]  Level of Care: Med-Surg [16]  Covid Evaluation: Confirmed COVID Positive  Diagnosis: Acute respiratory failure with hypoxia [id number]  Admitting Physician: Athena Masse [id number]  Attending Physician: Athena Masse [id number]  Estimated length of stay: 3 - 4 days  Certification:: I certify this patient will need inpatient services for at least 2 midnights       B Medical/Surgery History Past Medical History:  Diagnosis Date  . Bradycardia    a. during 02/2015 admission - occasional HR in 40s while being treated for atrial fib.  . Cervical disc disease   . Chronic diastolic CHF (congestive heart failure) ([place])    a. Dx 02/2015 -  2D echo 03/02/15 showed severe focal basal hypertrophy of the septum, EF 55-60%, mild MR, no effusion.  . CKD (chronic kidney disease), stage III   . Coronary  artery calcification seen on CT scan    a. Nuc 02/2015 was normal.  . Dyslipidemia   . GERD (gastroesophageal reflux disease)   . Hypertension   . 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 PAF (paroxysmal atrial fibrillation) ([place])    a. Dx 02/2015 - in and out on tele, rx'd Coumadin and amiodarone.  . Pneumonia 2007  . Shortness of breath dyspnea    WITH EXERTION  . Type II diabetes mellitus ([place])    Past Surgical History:  Procedure Laterality Date  . CATARACT EXTRACTION W/ INTRAOCULAR LENS  IMPLANT, BILATERAL Bilateral   . CERVICAL DISC SURGERY     "i've had 3 neck ORs; not sure what kind; all thru the back of my neck"  . CHOLECYSTECTOMY    . COLONOSCOPY WITH PROPOFOL N/A 01/02/2016   Procedure: COLONOSCOPY WITH PROPOFOL;  Surgeon: [name], MD;  Location: [facility];  Service: Endoscopy;  Laterality: N/A;  . EYE SURGERY  1930's   "don't know what for" (05/11/2013)  . JOINT REPLACEMENT     bilateral knees, elbows, and shoulders (on 05/11/2013 pt denies all joint replacements"   . KNEE ARTHROPLASTY     "had one scope; one open knee OR; not sure which on which side" (05/10/2013)  . KNEE ARTHROSCOPY     "had one scope; one open knee OR; not sure which on which side" (05/10/2013)  . POSTERIOR FUSION CERVICAL SPINE    . REVERSE SHOULDER ARTHROPLASTY  Left 05/19/2018   Procedure: REVERSE SHOULDER ARTHROPLASTY;  Surgeon: [name], MD;  Location: [place];  Service: Orthopedics;  Laterality: Left;  . ROTATOR CUFF REPAIR Bilateral   . VIDEO BRONCHOSCOPY Bilateral 02/01/2016   Procedure: VIDEO BRONCHOSCOPY WITHOUT FLUORO;  Surgeon: Marshell [name], MD;  Location: [facility];  Service: Cardiopulmonary;  Laterality: Bilateral;     A IV Location/Drains/Wounds Patient Lines/Drains/Airways Status   Active Line/Drains/Airways    Name:   Placement date:   Placement time:   Site:   Days:   Peripheral IV 05/30/19 Right Antecubital    05/30/19    2235    Antecubital   1   Peripheral IV 05/31/19 Right Wrist   05/31/19    0040    Wrist   less than 1   External Urinary Catheter   05/06/19    1848    --   25   Incision (Closed) 05/19/18 Arm Left   05/19/18    1511     377   Wound / Incision (Open or Dehisced) 05/14/18 Laceration Eye Left stiches   05/14/18    0000    Eye   382          Intake/Output Last 24 hours  Intake/Output Summary (Last 24 hours) at 05/31/2019 1840 Last data filed at 05/30/2019 2236 Gross per 24 hour  Intake --  Output 100 ml  Net -100 ml    Labs/Imaging Results for orders placed or performed during the hospital encounter of 05/30/19 (from the past 48 hour(s))  Comprehensive metabolic panel     Status: Abnormal   Collection Time: 05/30/19 10:14 PM  Result Value Ref Range   Sodium 141 135 - 145 mmol/L   Potassium 3.7 3.5 - 5.1 mmol/L    Comment: HEMOLYSIS AT THIS LEVEL MAY AFFECT RESULT   Chloride 102 98 - 111 mmol/L   CO2 28 22 - 32 mmol/L   Glucose, Bld 92 70 - 99 mg/dL   BUN 27 (H) 8 - 23 mg/dL   Creatinine, Ser 1.98 (H) 0.61 - 1.24 mg/dL   Calcium 8.5 (L) 8.9 - 10.3 mg/dL   Total Protein 5.5 (L) 6.5 - 8.1 g/dL   Albumin 2.9 (L) 3.5 - 5.0 g/dL   AST 30 15 - 41 U/L   ALT 19 0 - 44 U/L   Alkaline Phosphatase 102 38 - 126 U/L   Total Bilirubin 0.7 0.3 - 1.2 mg/dL   GFR calc non Af Amer 30 (L) >60 mL/min   GFR calc Af Amer 35 (L) >60 mL/min   Anion gap 11 5 - 15    Comment: Performed at [facility], [address] [place], [place] [address]  CBC     Status: Abnormal   Collection Time: 05/30/19 10:14 PM  Result Value Ref Range   WBC 7.6 4.0 - 10.5 K/uL   RBC 4.08 (L) 4.22 - 5.81 MIL/uL   Hemoglobin 12.5 (L) 13.0 - 17.0 g/dL   HCT 39.1 39.0 - 52.0 %   MCV 95.8 80.0 - 100.0 fL   MCH 30.6 26.0 - 34.0 pg   MCHC 32.0 30.0 - 36.0 g/dL   RDW 13.5 11.5 - 15.5 %   Platelets 216 150 - 400 K/uL   nRBC 0.0 0.0 - 0.2 %    Comment: Performed at [facility], [address], [place], [place] [address]  Brain natriuretic peptide     Status: Abnormal   Collection Time: 05/30/19 10:14  PM  Result Value Ref Range   B Natriuretic Peptide 214.0 (H) 0.0 - 100.0 pg/mL    Comment: Performed at [facility], [address] [place], [place] [address]  Urinalysis, Complete w Microscopic     Status: Abnormal   Collection Time: 05/30/19 10:29 PM  Result Value Ref Range   Color, Urine YELLOW (A) YELLOW   APPearance HAZY (A) CLEAR   Specific Gravity, Urine 1.011 1.005 - 1.030   pH 5.0 5.0 - 8.0   Glucose, UA NEGATIVE NEGATIVE mg/dL   Hgb urine dipstick NEGATIVE NEGATIVE   Bilirubin Urine NEGATIVE NEGATIVE   Ketones, ur NEGATIVE NEGATIVE mg/dL   Protein, ur NEGATIVE NEGATIVE mg/dL   Nitrite NEGATIVE NEGATIVE   Leukocytes,Ua NEGATIVE NEGATIVE   RBC / HPF 0-5 0 - 5 RBC/hpf   WBC, UA 0-5 0 - 5 WBC/hpf   Bacteria, UA NONE SEEN NONE SEEN   Squamous Epithelial / LPF NONE SEEN 0 - 5   Hyaline Casts, UA PRESENT     Comment: Performed at [facility], [place], [place] [address]  Troponin I (High Sensitivity)     Status: Abnormal   Collection Time: 05/30/19 11:07 PM  Result Value Ref Range   Troponin I (High Sensitivity) 63 (H) <18 ng/L    Comment: (NOTE) Elevated high sensitivity troponin I (hsTnI) values and significant  changes across serial measurements may suggest ACS but many other  chronic and acute conditions are known to elevate hsTnI results.  Refer to the "Links" section for chest pain algorithms and additional  guidance. Performed at [facility], [address] [place], [place] [address]   Culture, blood (routine x 2)     Status: None (Preliminary result)   Collection Time: 05/30/19 11:15 PM   Specimen: Right Antecubital; Blood  Result Value Ref Range   Specimen Description RIGHT ANTECUBITAL    Special Requests Blood Culture adequate volume    Culture      NO GROWTH < 12 HOURS Performed at  [facility], [address], [place], [place] [address]    Report Status PENDING   Culture, blood (routine x 2)     Status: None (Preliminary result)   Collection Time: 05/30/19 11:15 PM   Specimen: BLOOD RIGHT WRIST  Result Value Ref Range   Specimen Description BLOOD RIGHT WRIST    Special Requests      Blood Culture results may not be optimal due to an excessive volume of blood received in culture bottles   Culture      NO GROWTH < 12 HOURS Performed at [facility], [address], [place], [place] [address]    Report Status PENDING   Lactic acid, plasma     Status: None   Collection Time: 05/30/19 11:15 PM  Result Value Ref Range   Lactic Acid, Venous 0.8 0.5 - 1.9 mmol/L    Comment: Performed at [facility], [address] [place], [place] [address]  POC SARS Coronavirus 2 Ag     Status: Abnormal   Collection Time: 05/30/19 11:40 PM  Result Value Ref Range   SARS Coronavirus 2 Ag POSITIVE (A) NEGATIVE    Comment: (NOTE) SARS-CoV-2 antigen PRESENT. Positive results indicate the presence of viral antigens, but clinical correlation with patient history and other diagnostic information is necessary to determine patient infection status.  Positive results do not rule out bacterial infection or co-infection  with other viruses. False positive results are rare  but can occur, and confirmatory RT-PCR testing may be appropriate in some circumstances. The expected result is Negative. Fact Sheet for Patients: PodPark.tn Fact Sheet for Providers: GiftContent.is  This test is not yet approved or cleared by the [place] FDA and  has been authorized for detection and/or diagnosis of SARS-CoV-2 by FDA under an Emergency Use Authorization (EUA).  This EUA will remain in effect (meaning this test can be used) for the duration of  the COVID-19 declaration under Section 564(b)(1) of the Act,  21 U.S.C. section 360bbb-3(b)(1), unless the a uthorization is terminated or revoked sooner.   POC SARS Coronavirus 2 Ag     Status: None   Collection Time: 05/30/19 11:42 PM  Result Value Ref Range   SARS Coronavirus 2 Ag NEGATIVE NEGATIVE    Comment: (NOTE) SARS-CoV-2 antigen NOT DETECTED.  Negative results are presumptive.  Negative results do not preclude SARS-CoV-2 infection and should not be used as the sole basis for treatment or other patient management decisions, including infection  control decisions, particularly in the presence of clinical signs and  symptoms consistent with COVID-19, or in those who have been in contact with the virus.  Negative results must be combined with clinical observations, patient history, and epidemiological information. The expected result is Negative. Fact Sheet for Patients: PodPark.tn Fact Sheet for Healthcare Providers: GiftContent.is This test is not yet approved or cleared by the [place] FDA and  has been authorized for detection and/or diagnosis of SARS-CoV-2 by FDA under an Emergency Use Authorization (EUA).  This EUA will remain in effect (meaning this test can be used) for the duration of  the COVID-19 de claration under Section 564(b)(1) of the Act, 21 U.S.C. section 360bbb-3(b)(1), unless the authorization is terminated or revoked sooner.   Respiratory Panel by RT PCR (Flu A&B, Covid) - Nasopharyngeal Swab     Status: Abnormal   Collection Time: 05/31/19 12:32 AM   Specimen: Nasopharyngeal Swab  Result Value Ref Range   SARS Coronavirus 2 by RT PCR POSITIVE (A) NEGATIVE    Comment: RESULT CALLED TO, READ BACK BY AND VERIFIED WITH: H OARE 05/31/19 AT 0300 HS    Influenza A by PCR NEGATIVE NEGATIVE   Influenza B by PCR NEGATIVE NEGATIVE    Comment: (NOTE) The Xpert Xpress SARS-CoV-2/FLU/RSV assay is intended as an aid in  the diagnosis of influenza from  Nasopharyngeal swab specimens and  should not be used as a sole basis for treatment. Nasal washings and  aspirates are unacceptable for Xpert Xpress SARS-CoV-2/FLU/RSV  testing. Fact Sheet for Patients: PinkCheek.be Fact Sheet for Healthcare Providers: GravelBags.it This test is not yet approved or cleared by the [place] FDA and  has been authorized for detection and/or diagnosis of SARS-CoV-2 by  FDA under an Emergency Use Authorization (EUA). This EUA will remain  in effect (meaning this test can be used) for the duration of the  Covid-19 declaration under Section 564(b)(1) of the Act, 21  U.S.C. section 360bbb-3(b)(1), unless the authorization is  terminated or revoked. Performed at [facility], [place], [place] [address]   Troponin I (High Sensitivity)     Status: Abnormal   Collection Time: 05/31/19  3:46 AM  Result Value Ref Range   Troponin I (High Sensitivity) 58 (H) <18 ng/L    Comment: (NOTE) Elevated high sensitivity troponin I (hsTnI) values and significant  changes across serial measurements may suggest ACS but many other  chronic and acute conditions are  known to elevate hsTnI results.  Refer to the "Links" section for chest pain algorithms and additional  guidance. Performed at [facility], [address] [place], [place] [address]   HIV Antibody (routine testing w rflx)     Status: None   Collection Time: 05/31/19  3:46 AM  Result Value Ref Range   HIV Screen 4th Generation wRfx NON REACTIVE NON REACTIVE    Comment: Performed at [facility] [address], [place], [place] [address]  CBC     Status: Abnormal   Collection Time: 05/31/19  3:46 AM  Result Value Ref Range   WBC 6.4 4.0 - 10.5 K/uL   RBC 3.74 (L) 4.22 - 5.81 MIL/uL   Hemoglobin 11.3 (L) 13.0 - 17.0 g/dL   HCT 36.3 (L) 39.0 - 52.0 %   MCV 97.1 80.0 - 100.0 fL   MCH 30.2 26.0 - 34.0 pg    MCHC 31.1 30.0 - 36.0 g/dL   RDW 13.7 11.5 - 15.5 %   Platelets 176 150 - 400 K/uL   nRBC 0.0 0.0 - 0.2 %    Comment: Performed at [facility], [address] [place], [place] [address]  Creatinine, serum     Status: Abnormal   Collection Time: 05/31/19  3:46 AM  Result Value Ref Range   Creatinine, Ser 2.07 (H) 0.61 - 1.24 mg/dL   GFR calc non Af Amer 28 (L) >60 mL/min   GFR calc Af Amer 33 (L) >60 mL/min    Comment: Performed at [facility], [address] [place], [place] [address]  ABO/Rh     Status: None   Collection Time: 05/31/19  3:47 AM  Result Value Ref Range   ABO/RH(D)      Jenetta Downer NEG Performed at [facility], [address] [place], [place] [address]   Glucose, capillary     Status: Abnormal   Collection Time: 05/31/19  5:28 PM  Result Value Ref Range   Glucose-Capillary 300 (H) 70 - 99 mg/dL   CT Head Wo Contrast  Result Date: 05/31/2019 CLINICAL DATA:  Change in mental status EXAM: CT HEAD WITHOUT CONTRAST TECHNIQUE: Contiguous axial images were obtained from the base of the skull through the vertex without intravenous contrast. COMPARISON:  March 20, 2019 FINDINGS: Brain: No evidence of acute territorial infarction, hemorrhage, hydrocephalus,extra-axial collection or mass lesion/mass effect. There is dilatation the ventricles and sulci consistent with age-related atrophy. Low-attenuation changes in the deep white matter consistent with small vessel ischemia. Vascular: No hyperdense vessel or unexpected calcification. Skull: The skull is intact. No fracture or focal lesion identified. Sinuses/Orbits: The visualized paranasal sinuses and mastoid air cells are clear. The orbits and globes intact. Other: None IMPRESSION: No acute intracranial abnormality. Findings consistent with age related atrophy and chronic small vessel ischemia Electronically Signed   By: Prudencio Pair M.D.   On: 05/31/2019 00:05   DG Chest Port 1 View  Result Date:  05/30/2019 CLINICAL DATA:  86 year old male with increased confusion and weakness. EXAM: PORTABLE CHEST 1 VIEW COMPARISON:  Portable chest 05/01/2019 and earlier. FINDINGS: Portable AP semi upright views at 2244 hours. Stable lung volumes and mediastinal contours. Visualized tracheal air column is within normal limits. Ventilation has not significantly changed going back to October portable chest radiograph. No pneumothorax, pulmonary edema or definite effusion. Mild atelectasis or scarring at the left lung base is stable. Stable cholecystectomy clips. Left shoulder arthroplasty. Cervical ACDF. No acute osseous abnormality identified.  IMPRESSION: Chronic atelectasis or scarring at the left lung base. No acute cardiopulmonary abnormality. Electronically Signed   By: Genevie Ann M.D.   On: 05/30/2019 23:08    Pending Labs Unresulted Labs (From admission, onward)    Start     Ordered   06/07/19 0500  Creatinine, serum  (enoxaparin (LOVENOX)    CrCl >/= 30 ml/min)  Weekly,   STAT    Comments: while on enoxaparin therapy    05/31/19 0113          Vitals/Pain Today's Vitals   05/31/19 1745 05/31/19 1800 05/31/19 1815 05/31/19 1830  BP:  (!) 147/58  (!) 162/50  Pulse: 67 70 65 (!) 57  Resp: 17 16 18 16   Temp:      TempSrc:      SpO2: 100% 99% 100% 100%  Weight:      Height:      PainSc:        Isolation Precautions Airborne and Contact precautions  Medications Medications  cefTRIAXone (ROCEPHIN) 2 g in sodium chloride 0.9 % 100 mL IVPB (0 g Intravenous Stopped 05/31/19 0419)  azithromycin (ZITHROMAX) 500 mg in sodium chloride 0.9 % 250 mL IVPB (0 mg Intravenous Stopped 05/31/19 0501)  enoxaparin (LOVENOX) injection 30 mg (30 mg Subcutaneous Given 05/31/19 1006)  remdesivir 200 mg in sodium chloride 0.9% 250 mL IVPB (0 mg Intravenous Stopped 05/31/19 0551)    Followed by  remdesivir 100 mg in sodium chloride 0.9 % 100 mL IVPB (has no administration in time range)  albuterol (VENTOLIN HFA) 108  (90 Base) MCG/ACT inhaler 2 puff (2 puffs Inhalation Given 05/31/19 1541)  dexamethasone (DECADRON) injection 6 mg (6 mg Intravenous Given 05/31/19 0508)  guaiFENesin-dextromethorphan (ROBITUSSIN DM) 100-10 MG/5ML syrup 10 mL (has no administration in time range)  ascorbic acid (VITAMIN C) tablet 500 mg (500 mg Oral Given 05/31/19 1005)  zinc sulfate capsule 220 mg (220 mg Oral Given 05/31/19 1004)  aspirin chewable tablet 81 mg (81 mg Oral Given 05/31/19 1536)  febuxostat (ULORIC) tablet 40 mg (40 mg Oral Given 05/31/19 1537)  atorvastatin (LIPITOR) tablet 40 mg (has no administration in time range)  hydrALAZINE (APRESOLINE) tablet 100 mg (100 mg Oral Given 05/31/19 1536)  isosorbide mononitrate (IMDUR) 24 hr tablet 60 mg (60 mg Oral Given 05/31/19 1536)  metoprolol tartrate (LOPRESSOR) tablet 12.5 mg (12.5 mg Oral Not Given 05/31/19 1510)  nitroGLYCERIN (NITROSTAT) SL tablet 0.4 mg (has no administration in time range)  citalopram (CELEXA) tablet 20 mg (20 mg Oral Given 05/31/19 1537)  pantoprazole (PROTONIX) EC tablet 40 mg (40 mg Oral Given 05/31/19 1537)  polyethylene glycol (MIRALAX / GLYCOLAX) packet 17 g (has no administration in time range)  multivitamin with minerals tablet 1 tablet (1 tablet Oral Refused 05/31/19 1511)  loratadine (CLARITIN) tablet 10 mg (10 mg Oral Given 05/31/19 1537)  insulin glargine (LANTUS) injection 10 Units (has no administration in time range)  insulin aspart (novoLOG) injection 0-9 Units (5 Units Subcutaneous Given 05/31/19 1811)  insulin aspart (novoLOG) injection 0-5 Units (has no administration in time range)  ondansetron (ZOFRAN) injection 4 mg (4 mg Intravenous Given 05/30/19 2216)    Mobility non-ambulatory High fall risk   Focused Assessments Neuro Assessment Handoff:  Swallow screen pass? Yes  Cardiac Rhythm: Sinus bradycardia       Neuro Assessment: Exceptions to WDL Neuro Checks:      Last Documented NIHSS Modified Score:   Has TPA been given?  No If patient is a  Neuro Trauma and patient is going to OR before floor call report to [place] nurse: [phone] or [phone]     R Recommendations: See Admitting Provider Note  Report given to:   Additional Notes:

## 2019-05-31 NOTE — ED Notes (Signed)
Called to update wife and sister in law but no answer or voicemail

## 2019-05-31 NOTE — H&P (Signed)
History and Physical    Justin Leach [id number] DOB: Jun 10, 1931 DOA: 05/30/2019  PCP: Alvester Morin, MD   Patient coming from: [facility]  I have personally briefly reviewed patient's old medical records in [place]  Chief Complaint: altered mental status  HPI: Justin Leach is a 86 y.o. male with medical history significant for  interstitial lung disease not on chronic oxygen, paroxysmal atrial fibrillation,hx of subdural hematoma,chronic diastolic heart failure, CKD stage III, type 2 diabetes, hypertension who presents with worsening confusion above his baseline.when evaluated, patient is alert and was oriented to person place and was able to say the year but did not know why he was in the hospital.  He appeared confused when further questions were asked.  Denied complaints of pain, nausea vomiting, GI or GU symptoms history is unreliable. EMS recorded an O2 sat of 86% on room air.  Was hospitalized in December 2020 with hypoxia secondary to pneumonia and was discharged without home O2.  ED Course: On arrival to the emergency room, satting at 100% on 2 L.  He was afebrile, blood pressure 142/46, heart rate 56 but he was tachypneic at 28.  WBC was 7000.  Creatinine 1.98 above from baseline of 1.7.  Urinalysis negative, chest x-ray showed no acute disease.  Head CT negative EKG normal sinus rhythm.  Troponin was 63 and BNP was 214.  Following admission, Covid test returned positive.  Review of Systems: Unable to obtain as patient was confused  Past Medical History:  Diagnosis Date  . Bradycardia    a. during 02/2015 admission - occasional HR in 40s while being treated for atrial fib.  . Cervical disc disease   . Chronic diastolic CHF (congestive heart failure) ([place])    a. Dx 02/2015 -  2D echo 03/02/15 showed severe focal basal hypertrophy of the septum, EF 55-60%, mild MR, no effusion.  . CKD (chronic kidney disease), stage III   . Coronary artery  calcification seen on CT scan    a. Nuc 02/2015 was normal.  . Dyslipidemia   . GERD (gastroesophageal reflux disease)   . Hypertension   . 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 PAF (paroxysmal atrial fibrillation) ([place])    a. Dx 02/2015 - in and out on tele, rx'd Coumadin and amiodarone.  . Pneumonia 2007  . Shortness of breath dyspnea    WITH EXERTION  . Type II diabetes mellitus ([place])     Past Surgical History:  Procedure Laterality Date  . CATARACT EXTRACTION W/ INTRAOCULAR LENS  IMPLANT, BILATERAL Bilateral   . CERVICAL DISC SURGERY     "i've had 3 neck ORs; not sure what kind; all thru the back of my neck"  . CHOLECYSTECTOMY    . COLONOSCOPY WITH PROPOFOL N/A 01/02/2016   Procedure: COLONOSCOPY WITH PROPOFOL;  Surgeon: [name], MD;  Location: [facility];  Service: Endoscopy;  Laterality: N/A;  . EYE SURGERY  1930's   "don't know what for" (05/11/2013)  . JOINT REPLACEMENT     bilateral knees, elbows, and shoulders (on 05/11/2013 pt denies all joint replacements"   . KNEE ARTHROPLASTY     "had one scope; one open knee OR; not sure which on which side" (05/10/2013)  . KNEE ARTHROSCOPY     "had one scope; one open knee OR; not sure which on which side" (05/10/2013)  . POSTERIOR FUSION CERVICAL SPINE    . REVERSE SHOULDER ARTHROPLASTY  Left 05/19/2018   Procedure: REVERSE SHOULDER ARTHROPLASTY;  Surgeon: [name], MD;  Location: [facility];  Service: Orthopedics;  Laterality: Left;  . ROTATOR CUFF REPAIR Bilateral   . VIDEO BRONCHOSCOPY Bilateral 02/01/2016   Procedure: VIDEO BRONCHOSCOPY WITHOUT FLUORO;  Surgeon: Marshell [name], MD;  Location: [facility];  Service: Cardiopulmonary;  Laterality: Bilateral;     reports that he has never smoked. He has quit using smokeless tobacco.  His smokeless tobacco use included chew. He reports that he does not drink alcohol or use drugs.  Allergies  Allergen Reactions  .  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 Patient subsequently taken off amiodarone.  . Nifedipine Other (See Comments)    Reaction: made gums swell up. Pt states that he had to have gum surgery after taking.     Family History  Problem Relation Age of Onset  . Diabetes Mellitus II Paternal Grandmother   . Heart disease Neg Hx        He does not know his father's history.   . Cancer Neg Hx   . Diabetes Neg Hx   . CAD Neg Hx   . Lung disease Neg Hx   . Rheumatologic disease Neg Hx      Prior to Admission medications   Medication Sig Start Date End Date Taking? Authorizing Provider  acetaminophen (TYLENOL) 325 MG tablet Take 650 mg by mouth every 6 (six) hours as needed for mild pain or moderate pain.  06/11/18   [provider]  aspirin 81 MG chewable tablet Chew 1 tablet (81 mg total) by mouth daily. 07/23/18   Loletha Grayer, MD  atorvastatin (LIPITOR) 40 MG tablet Take 40 mg by mouth at bedtime.  05/29/18   [provider]  bisacodyl (DULCOLAX) 10 MG suppository Place 1 suppository (10 mg total) rectally daily as needed for moderate constipation. 05/27/18   Rai, Vernelle Emerald, MD  citalopram (CELEXA) 20 MG tablet Take 1 tablet (20 mg total) by mouth daily. 05/08/19   Lavina Hamman, MD  febuxostat (ULORIC) 40 MG tablet Take 40 mg by mouth daily. 06/02/18   [provider]  furosemide (LASIX) 20 MG tablet Take 1 tablet (20 mg total) by mouth daily as needed for fluid or edema. 05/07/19   Lavina Hamman, MD  hydrALAZINE (APRESOLINE) 100 MG tablet Take 1 tablet (100 mg total) by mouth 3 (three) times daily. 05/07/19   Lavina Hamman, MD  [employer] [facility]) CREA Apply 1 application topically 3 (three) times daily. (apply to perirectal area)    [provider]  insulin aspart (NOVOLOG) 100 UNIT/ML injection Inject 8 Units into the skin 4  (four) times daily - after meals and at bedtime. Sliding Scale:  200-249: 2u 250-299: 4u 300-349: 6u 350-399: 8u 400-449: 10u 450-499: 12u 500+ Contact physican    [provider]  isosorbide mononitrate (IMDUR) 60 MG 24 hr tablet Take 1 tablet (60 mg total) by mouth daily. 07/23/18   Loletha Grayer, MD  LANTUS SOLOSTAR 100 UNIT/ML Solostar Pen Inject 10 Units into the skin at bedtime. 05/07/19   Lavina Hamman, MD  lisinopril (ZESTRIL) 5 MG tablet Take 1 tablet (5 mg total) by mouth daily. 05/08/19   Lavina Hamman, MD  loperamide (IMODIUM) 2 MG capsule Take 2-4 mg by mouth See admin instructions. Take 2 capsules (4mg ) by mouth at onset of symptoms  and take 1 capsule (2mg ) by mouth every 8 hours if needed - max 4 capsules in 24 hours    [provider]  loratadine (CLARITIN) 10 MG tablet Take 10 mg by mouth daily.    [provider]  metoprolol tartrate (LOPRESSOR) 50 MG tablet Take 1 tablet (50 mg total) by mouth 2 (two) times daily. 05/07/19   Lavina Hamman, MD  Multiple Vitamin (MULTIVITAMIN WITH MINERALS) TABS tablet Take 1 tablet by mouth daily.    [provider]  nitroGLYCERIN (NITROSTAT) 0.4 MG SL tablet Place 0.4 mg under the tongue every 5 (five) minutes as needed for chest pain (max 3 doses).  07/20/18   [provider]  omeprazole (PRILOSEC) 20 MG capsule Take 20 mg by mouth daily.  02/26/13   [provider]  ondansetron (ZOFRAN) 4 MG tablet Take 4 mg by mouth every 4 (four) hours as needed for nausea or vomiting.    [provider]  polyethylene glycol (MIRALAX / GLYCOLAX) packet Take 17 g by mouth daily as needed for mild constipation. 05/27/18   Rai, Vernelle Emerald, MD  predniSONE (DELTASONE) 10 MG tablet Take 20mg  daily for 3days,Take 10mg  daily for 3days, then stop 05/07/19   Lavina Hamman, MD    Physical Exam: Vitals:   05/30/19 2209 05/30/19 2222 05/30/19 2335 05/31/19 0046  BP:   (!) 142/46 (!) 150/48  Pulse:    (!) 58 (!) 55  Resp:   20 (!) 28  Temp:   98.5 F (36.9 C)   TempSrc:   Rectal   SpO2:  (S) (!) 86% 100% 98%  Weight: 79.4 kg     Height: 5\' 11"  (1.803 m)        Vitals:   05/30/19 2209 05/30/19 2222 05/30/19 2335 05/31/19 0046  BP:   (!) 142/46 (!) 150/48  Pulse:   (!) 58 (!) 55  Resp:   20 (!) 28  Temp:   98.5 F (36.9 C)   TempSrc:   Rectal   SpO2:  (S) (!) 86% 100% 98%  Weight: 79.4 kg     Height: 5\' 11"  (1.803 m)       Constitutional: Drowsy, easily arousable alert and oriented x2 Eyes: PERRL, lids and conjunctivae normal ENMT: Mucous membranes are moist.  Neck: normal, supple, no masses, no thyromegaly Respiratory: clear to auscultation bilaterally, no wheezing, no crackles. Normal respiratory effort. No accessory muscle use.  Cardiovascular: Regular rate and rhythm, no murmurs / rubs / gallops. No extremity edema. 2+ pedal pulses. No carotid bruits.  Abdomen: no tenderness, no masses palpated. No hepatosplenomegaly. Bowel sounds positive.  Musculoskeletal: no clubbing / cyanosis. No joint deformity upper and lower extremities.  Skin: no rashes, lesions, ulcers.  Neurologic: No gross focal neurologic deficit. Psychiatric: Normal mood and affect.   Labs on Admission: I have personally reviewed following labs and imaging studies  CBC: Recent Labs  Lab 05/30/19 2214  WBC 7.6  HGB 12.5*  HCT 39.1  MCV 95.8  PLT 123XX123   Basic Metabolic Panel: Recent Labs  Lab 05/30/19 2214  NA 141  K 3.7  CL 102  CO2 28  GLUCOSE 92  BUN 27*  CREATININE 1.98*  CALCIUM 8.5*   GFR: Estimated Creatinine Clearance: 29.1 mL/min (A) (by C-G formula based on SCr of 1.98 mg/dL (H)). Liver Function Tests: Recent Labs  Lab 05/30/19 2214  AST 30  ALT 19  ALKPHOS 102  BILITOT 0.7  PROT 5.5*  ALBUMIN 2.9*  No results for input(s): LIPASE, AMYLASE in the last 168 hours. No results for input(s): AMMONIA in the last 168 hours. Coagulation Profile: No results for  input(s): INR, PROTIME in the last 168 hours. Cardiac Enzymes: No results for input(s): CKTOTAL, CKMB, CKMBINDEX, TROPONINI in the last 168 hours. BNP (last 3 results) No results for input(s): PROBNP in the last 8760 hours. HbA1C: No results for input(s): HGBA1C in the last 72 hours. CBG: No results for input(s): GLUCAP in the last 168 hours. Lipid Profile: No results for input(s): CHOL, HDL, LDLCALC, TRIG, CHOLHDL, LDLDIRECT in the last 72 hours. Thyroid Function Tests: No results for input(s): TSH, T4TOTAL, FREET4, T3FREE, THYROIDAB in the last 72 hours. Anemia Panel: No results for input(s): VITAMINB12, FOLATE, FERRITIN, TIBC, IRON, RETICCTPCT in the last 72 hours. Urine analysis:    Component Value Date/Time   COLORURINE YELLOW (A) 05/30/2019 2229   APPEARANCEUR HAZY (A) 05/30/2019 2229   LABSPEC 1.011 05/30/2019 2229   PHURINE 5.0 05/30/2019 2229   GLUCOSEU NEGATIVE 05/30/2019 2229   HGBUR NEGATIVE 05/30/2019 2229   BILIRUBINUR NEGATIVE 05/30/2019 2229   KETONESUR NEGATIVE 05/30/2019 2229   PROTEINUR NEGATIVE 05/30/2019 2229   UROBILINOGEN 0.2 05/11/2013 1632   NITRITE NEGATIVE 05/30/2019 2229   LEUKOCYTESUR NEGATIVE 05/30/2019 2229    Radiological Exams on Admission: CT Head Wo Contrast  Result Date: 05/31/2019 CLINICAL DATA:  Change in mental status EXAM: CT HEAD WITHOUT CONTRAST TECHNIQUE: Contiguous axial images were obtained from the base of the skull through the vertex without intravenous contrast. COMPARISON:  March 20, 2019 FINDINGS: Brain: No evidence of acute territorial infarction, hemorrhage, hydrocephalus,extra-axial collection or mass lesion/mass effect. There is dilatation the ventricles and sulci consistent with age-related atrophy. Low-attenuation changes in the deep white matter consistent with small vessel ischemia. Vascular: No hyperdense vessel or unexpected calcification. Skull: The skull is intact. No fracture or focal lesion identified.  Sinuses/Orbits: The visualized paranasal sinuses and mastoid air cells are clear. The orbits and globes intact. Other: None IMPRESSION: No acute intracranial abnormality. Findings consistent with age related atrophy and chronic small vessel ischemia Electronically Signed   By: Prudencio Pair M.D.   On: 05/31/2019 00:05   DG Chest Port 1 View  Result Date: 05/30/2019 CLINICAL DATA:  86 year old male with increased confusion and weakness. EXAM: PORTABLE CHEST 1 VIEW COMPARISON:  Portable chest 05/01/2019 and earlier. FINDINGS: Portable AP semi upright views at 2244 hours. Stable lung volumes and mediastinal contours. Visualized tracheal air column is within normal limits. Ventilation has not significantly changed going back to October portable chest radiograph. No pneumothorax, pulmonary edema or definite effusion. Mild atelectasis or scarring at the left lung base is stable. Stable cholecystectomy clips. Left shoulder arthroplasty. Cervical ACDF. No acute osseous abnormality identified. IMPRESSION: Chronic atelectasis or scarring at the left lung base. No acute cardiopulmonary abnormality. Electronically Signed   By: Genevie Ann M.D.   On: 05/30/2019 23:08    EKG: Independently reviewed.   Assessment/Plan Principal Problem:   Acute respiratory failure with hypoxia (HCC) Active Problems:   Acute metabolic encephalopathy   Acute kidney injury superimposed on chronic kidney disease (HCC)   Chronic diastolic CHF (congestive heart failure) (HCC)   PAF (paroxysmal atrial fibrillation) (HCC)   Essential hypertension   Interstitial pulmonary disease (HCC)   Acute hypoxia suspect Covid related, in the setting of history of ILD Continue to txpresumptively for pneumonia with Rocephin and azithromycin Start remdesivir dexamethasone,albuterl vitamins Oxygen to keep sats over 92% Initial Covid test  negative, follow-up PCR--returned positive   Acute metabolic encephalopathy Possibly related to hypoxia,  possible pneumonia N.p.o. until more alert Fall and aspiration precautions, neurologic checks   Paroxysmal atrial fibrillation off Coumadin since hx of subdural hematoma  continue metoprolol   Chronic diastolic heart failure - have mild lower extremity edema but no significant fluid overload - continue Lasix , Imdur, hydralazine, lisinopril -Monitor volume status  Acute on chronic kidney disease stage III - creatinine stabl3 - avoid nephrotoxic agent  Type 2 diabetes insulin-dependent Supplemental insulin coverage  Hypertension Continue home meds  Hyperlipidemia Statin   DVT prophylaxis: lovenox Code Status: DNR  Family Communication: spouse, Justin Leach Disposition Plan: Back to previous home environment Consults called: none     Athena Masse MD [facility]     05/31/2019, 1:13 AM

## 2019-05-31 NOTE — ED Notes (Signed)
Pt desatting on 2L while sleeping, oxygen bumped up to 4L per [place] while pt is sleeping, snoring in room, no distress noted.

## 2019-05-31 NOTE — Progress Notes (Signed)
Patient ID: Justin Leach, male   DOB: 27-Sep-1931, 86 y.o.   MRN: [id number] [facility] PROGRESS NOTE  Justin Leach [id number] DOB: 12-14-1931 DOA: 05/30/2019 PCP: Alvester Morin, MD  HPI/Subjective: Lying comfortably in bed.  Awaken from sleep.  Did answer some questions.  Stated he lives alone.  In speaking with the patient's wife he was at [facility] and then back at [facility].  Patient is hard of hearing.  Objective: Vitals:   05/31/19 0943 05/31/19 1100  BP: (!) 197/62 (!) 154/50  Pulse: 60 (!) 57  Resp: 20 18  Temp: 97.8 F (36.6 C)   SpO2: 99% 98%    Filed Weights   05/30/19 2209  Weight: 79.4 kg    ROS: Review of Systems  Constitutional: Negative for chills and fever.  Eyes: Negative for blurred vision.  Respiratory: Positive for cough and shortness of breath.   Cardiovascular: Negative for chest pain.  Gastrointestinal: Negative for abdominal pain, constipation, diarrhea, nausea and vomiting.  Genitourinary: Negative for dysuria.  Musculoskeletal: Negative for joint pain.  Neurological: Negative for dizziness and headaches.   Exam: Physical Exam  HENT:  Nose: No mucosal edema.  Mouth/Throat: No oropharyngeal exudate or posterior oropharyngeal edema.  Eyes: Conjunctivae and lids are normal.  Neck: No JVD present. Carotid bruit is not present. No thyroid mass and no thyromegaly present.  Cardiovascular: S1 normal and S2 normal. Exam reveals no gallop.  No murmur heard. Respiratory: No respiratory distress. He has decreased breath sounds in the right lower field and the left lower field. He has no wheezes. He has rhonchi in the right lower field and the left lower field. He has no rales.  GI: Soft. Bowel sounds are normal. There is no abdominal tenderness.  Musculoskeletal:     Cervical back: No edema.     Right ankle: No swelling.     Left ankle: No swelling.  Lymphadenopathy:    He has no cervical adenopathy.   Neurological: He is alert.  Skin: Skin is warm. No rash noted. Nails show no clubbing.  Psychiatric: He has a normal mood and affect.      Data Reviewed: Basic Metabolic Panel: Recent Labs  Lab 05/30/19 2214 05/31/19 0346  NA 141  --   K 3.7  --   CL 102  --   CO2 28  --   GLUCOSE 92  --   BUN 27*  --   CREATININE 1.98* 2.07*  CALCIUM 8.5*  --    Liver Function Tests: Recent Labs  Lab 05/30/19 2214  AST 30  ALT 19  ALKPHOS 102  BILITOT 0.7  PROT 5.5*  ALBUMIN 2.9*   CBC: Recent Labs  Lab 05/30/19 2214 05/31/19 0346  WBC 7.6 6.4  HGB 12.5* 11.3*  HCT 39.1 36.3*  MCV 95.8 97.1  PLT 216 176   BNP (last 3 results) Recent Labs    03/20/19 1958 05/01/19 2049 05/30/19 2214  BNP 199.0* 142.0* 214.0*    Recent Results (from the past 240 hour(s))  Culture, blood (routine x 2)     Status: None (Preliminary result)   Collection Time: 05/30/19 11:15 PM   Specimen: Right Antecubital; Blood  Result Value Ref Range Status   Specimen Description RIGHT ANTECUBITAL  Final   Special Requests Blood Culture adequate volume  Final   Culture   Final    NO GROWTH < 12 HOURS Performed at [facility], [address] [place], [place] [address]  Report Status PENDING  Incomplete  Culture, blood (routine x 2)     Status: None (Preliminary result)   Collection Time: 05/30/19 11:15 PM   Specimen: BLOOD RIGHT WRIST  Result Value Ref Range Status   Specimen Description BLOOD RIGHT WRIST  Final   Special Requests   Final    Blood Culture results may not be optimal due to an excessive volume of blood received in culture bottles   Culture   Final    NO GROWTH < 12 HOURS Performed at [facility], [address], [place], [place] [address]    Report Status PENDING  Incomplete  Respiratory Panel by RT PCR (Flu A&B, Covid) - Nasopharyngeal Swab     Status: Abnormal   Collection Time: 05/31/19 12:32 AM   Specimen: Nasopharyngeal Swab  Result Value Ref  Range Status   SARS Coronavirus 2 by RT PCR POSITIVE (A) NEGATIVE Final    Comment: RESULT CALLED TO, READ BACK BY AND VERIFIED WITH: H OARE 05/31/19 AT 0300 HS    Influenza A by PCR NEGATIVE NEGATIVE Final   Influenza B by PCR NEGATIVE NEGATIVE Final    Comment: (NOTE) The Xpert Xpress SARS-CoV-2/FLU/RSV assay is intended as an aid in  the diagnosis of influenza from Nasopharyngeal swab specimens and  should not be used as a sole basis for treatment. Nasal washings and  aspirates are unacceptable for Xpert Xpress SARS-CoV-2/FLU/RSV  testing. Fact Sheet for Patients: PinkCheek.be Fact Sheet for Healthcare Providers: GravelBags.it This test is not yet approved or cleared by the [place] FDA and  has been authorized for detection and/or diagnosis of SARS-CoV-2 by  FDA under an Emergency Use Authorization (EUA). This EUA will remain  in effect (meaning this test can be used) for the duration of the  Covid-19 declaration under Section 564(b)(1) of the Act, 21  U.S.C. section 360bbb-3(b)(1), unless the authorization is  terminated or revoked. Performed at [facility], [address] [place], [place] [address]      Studies: CT Head Wo Contrast  Result Date: 05/31/2019 CLINICAL DATA:  Change in mental status EXAM: CT HEAD WITHOUT CONTRAST TECHNIQUE: Contiguous axial images were obtained from the base of the skull through the vertex without intravenous contrast. COMPARISON:  March 20, 2019 FINDINGS: Brain: No evidence of acute territorial infarction, hemorrhage, hydrocephalus,extra-axial collection or mass lesion/mass effect. There is dilatation the ventricles and sulci consistent with age-related atrophy. Low-attenuation changes in the deep white matter consistent with small vessel ischemia. Vascular: No hyperdense vessel or unexpected calcification. Skull: The skull is intact. No fracture or focal lesion identified.  Sinuses/Orbits: The visualized paranasal sinuses and mastoid air cells are clear. The orbits and globes intact. Other: None IMPRESSION: No acute intracranial abnormality. Findings consistent with age related atrophy and chronic small vessel ischemia Electronically Signed   By: Prudencio Pair M.D.   On: 05/31/2019 00:05   DG Chest Port 1 View  Result Date: 05/30/2019 CLINICAL DATA:  86 year old male with increased confusion and weakness. EXAM: PORTABLE CHEST 1 VIEW COMPARISON:  Portable chest 05/01/2019 and earlier. FINDINGS: Portable AP semi upright views at 2244 hours. Stable lung volumes and mediastinal contours. Visualized tracheal air column is within normal limits. Ventilation has not significantly changed going back to October portable chest radiograph. No pneumothorax, pulmonary edema or definite effusion. Mild atelectasis or scarring at the left lung base is stable. Stable cholecystectomy clips. Left shoulder arthroplasty. Cervical ACDF. No acute osseous abnormality identified. IMPRESSION: Chronic atelectasis or scarring  at the left lung base. No acute cardiopulmonary abnormality. Electronically Signed   By: Genevie Ann M.D.   On: 05/30/2019 23:08    Scheduled Meds: . albuterol  2 puff Inhalation Q6H  . vitamin C  500 mg Oral Daily  . aspirin  81 mg Oral Daily  . atorvastatin  40 mg Oral QHS  . citalopram  20 mg Oral Daily  . dexamethasone (DECADRON) injection  6 mg Intravenous Q24H  . enoxaparin (LOVENOX) injection  30 mg Subcutaneous Q24H  . febuxostat  40 mg Oral Daily  . hydrALAZINE  100 mg Oral TID  . insulin glargine  10 Units Subcutaneous QHS  . isosorbide mononitrate  60 mg Oral Daily  . loratadine  10 mg Oral Daily  . metoprolol tartrate  12.5 mg Oral BID  . multivitamin with minerals  1 tablet Oral Daily  . pantoprazole  40 mg Oral Daily  . zinc sulfate  220 mg Oral Daily   Continuous Infusions: . azithromycin Stopped (05/31/19 0501)  . cefTRIAXone (ROCEPHIN)  IV Stopped  (05/31/19 0419)  . [START ON 06/01/2019] remdesivir 100 mg in NS 100 mL      Assessment/Plan:  1. Acute hypoxic respiratory failure.  Patient's pulse ox was 86% on room air.  Oxygen supplementation with 2 L nasal cannula. 2. COVID-19 pneumonia.  Continue remdesivir day 1 today.  Continue Decadron, zinc and vitamin C.  Follow inflammatory markers. 3. Acute metabolic encephalopathy.  Patient answering some questions and seems more alert.  Will place back on diet. 4. Paroxysmal atrial fibrillation.  Patient not on any anticoagulation secondary to subdural hematoma.  With bradycardia I will decrease the dose of metoprolol. 5. Chronic diastolic congestive heart failure.  No signs of heart failure.  Hold Lasix.  Continue Imdur and hydralazine.  Hold lisinopril for right now 6. Acute on chronic kidney disease stage III.  Monitor off Lasix and lisinopril for right now. 7. Type 2 diabetes mellitus with chronic kidney disease.  Placed on sliding scale insulin.  Last hemoglobin A1c couple months ago was 7.2. 8. Essential hypertension.  Started meds back again hydralazine and metoprolol. 9. History of interstitial lung disease  Code Status:     Code Status Orders  (From admission, onward)         Start     Ordered   05/31/19 0111  Do not attempt resuscitation (DNR)  Continuous    Question Answer Comment  In the event of cardiac or respiratory ARREST Do not call a "code blue"   In the event of cardiac or respiratory ARREST Do not perform Intubation, CPR, defibrillation or ACLS   In the event of cardiac or respiratory ARREST Use medication by any route, position, wound care, and other measures to relive pain and suffering. May use oxygen, suction and manual treatment of airway obstruction as needed for comfort.   Comments per wife linda Parrott      05/31/19 0113        Code Status History    Date Active Date Inactive Code Status Order ID Comments User Context   05/01/2019 [id number] 05/09/2019  0006 DNR [id number]  Orene Desanctis, DO ED   07/20/2018 1840 07/24/2018 0013 Full Code [id number]  Bettey Costa, MD ED   05/13/2018 2224 05/27/2018 1831 Full Code [id number]  Ivor Costa, MD ED   10/30/2015 2351 11/05/2015 2307 Full Code [id number]  Toy Baker, MD ED   03/01/2015 0008 03/07/2015 2240 Full Code [id number]  Rise Patience,  MD ED   05/11/2013 1354 05/17/2013 1943 Full Code [id number]  Rama, Venetia Maxon, MD Inpatient   Advance Care Planning Activity     Family Communication: Spoke with wife on the phone Disposition Plan: To be determined.  Currently lives at [facility]  Antibiotics:  Rocephin  Zithromax  Time spent: 36 minutes  [place]

## 2019-05-31 NOTE — ED Notes (Signed)
Handed patient phone to call wife

## 2019-05-31 NOTE — ED Notes (Signed)
Called patients wife updated of his condition. He spoke to his wife and sister in law.

## 2019-05-31 NOTE — Progress Notes (Signed)
Anticoagulation monitoring(Lovenox):  86yo  male ordered Lovenox 40 mg Q24h for DVT prevention  Filed Weights   05/30/19 2209  Weight: 175 lb 0.7 oz (79.4 kg)   BMI 24.41   Lab Results  Component Value Date   CREATININE 1.98 (H) 05/30/2019   CREATININE 1.75 (H) 05/07/2019   CREATININE 1.86 (H) 05/06/2019   Estimated Creatinine Clearance: 29.1 mL/min (A) (by C-G formula based on SCr of 1.98 mg/dL (H)). Hemoglobin & Hematocrit     Component Value Date/Time   HGB 12.5 (L) 05/30/2019 2214   HCT 39.1 05/30/2019 2214     Per Protocol for Patient with estCrcl < 30 ml/min and BMI < 40, will transition to Lovenox 30 mg Q24h.     Paulina Fusi, PharmD, BCPS 05/31/2019 1:17 AM

## 2019-05-31 NOTE — ED Notes (Signed)
Patient confused, unaware of location or why he is in hospital

## 2019-06-01 DIAGNOSIS — R410 Disorientation, unspecified: Secondary | ICD-10-CM

## 2019-06-01 LAB — CBC
HCT: 37.7 % — ABNORMAL LOW (ref 39.0–52.0)
Hemoglobin: 11.9 g/dL — ABNORMAL LOW (ref 13.0–17.0)
MCH: 31.1 pg (ref 26.0–34.0)
MCHC: 31.6 g/dL (ref 30.0–36.0)
MCV: 98.4 fL (ref 80.0–100.0)
Platelets: 221 10*3/uL (ref 150–400)
RBC: 3.83 MIL/uL — ABNORMAL LOW (ref 4.22–5.81)
RDW: 13.3 % (ref 11.5–15.5)
WBC: 8 10*3/uL (ref 4.0–10.5)
nRBC: 0 % (ref 0.0–0.2)

## 2019-06-01 LAB — COMPREHENSIVE METABOLIC PANEL
ALT: 24 U/L (ref 0–44)
AST: 32 U/L (ref 15–41)
Albumin: 2.8 g/dL — ABNORMAL LOW (ref 3.5–5.0)
Alkaline Phosphatase: 96 U/L (ref 38–126)
Anion gap: 13 (ref 5–15)
BUN: 44 mg/dL — ABNORMAL HIGH (ref 8–23)
CO2: 26 mmol/L (ref 22–32)
Calcium: 8.3 mg/dL — ABNORMAL LOW (ref 8.9–10.3)
Chloride: 103 mmol/L (ref 98–111)
Creatinine, Ser: 2.51 mg/dL — ABNORMAL HIGH (ref 0.61–1.24)
GFR calc Af Amer: 26 mL/min — ABNORMAL LOW (ref >60)
GFR calc non Af Amer: 22 mL/min — ABNORMAL LOW (ref >60)
Glucose, Bld: 335 mg/dL — ABNORMAL HIGH (ref 70–99)
Potassium: 4.5 mmol/L (ref 3.5–5.1)
Sodium: 142 mmol/L (ref 135–145)
Total Bilirubin: 0.6 mg/dL (ref 0.3–1.2)
Total Protein: 5.4 g/dL — ABNORMAL LOW (ref 6.5–8.1)

## 2019-06-01 LAB — GLUCOSE, CAPILLARY
Glucose-Capillary: 250 mg/dL — ABNORMAL HIGH (ref 70–99)
Glucose-Capillary: 350 mg/dL — ABNORMAL HIGH (ref 70–99)
Glucose-Capillary: 350 mg/dL — ABNORMAL HIGH (ref 70–99)
Glucose-Capillary: 299 mg/dL — ABNORMAL HIGH (ref 70–99)

## 2019-06-01 LAB — FERRITIN: Ferritin: 188 ng/mL (ref 24–336)

## 2019-06-01 LAB — C-REACTIVE PROTEIN: CRP: 3.7 mg/dL — ABNORMAL HIGH (ref <1.0)

## 2019-06-01 LAB — FIBRIN DERIVATIVES D-DIMER (ARMC ONLY): Fibrin derivatives D-dimer (ARMC): 2255.98 ng/mL (FEU) — ABNORMAL HIGH (ref 0.00–499.00)

## 2019-06-01 MED ORDER — QUETIAPINE FUMARATE 25 MG PO TABS
25.0000 mg | ORAL_TABLET | Freq: Every day | ORAL | Status: DC
  Administered 2019-06-01 – 2019-06-04 (×4): 25 mg via ORAL
  Filled 2019-06-01 (×4): qty 1

## 2019-06-01 MED ORDER — HEPARIN SODIUM (PORCINE) 5000 UNIT/ML IJ SOLN
5000.0000 [IU] | Freq: Three times a day (TID) | INTRAMUSCULAR | Status: DC
  Administered 2019-06-02 – 2019-06-05 (×8): 5000 [IU] via SUBCUTANEOUS
  Filled 2019-06-01 (×8): qty 1

## 2019-06-01 NOTE — Progress Notes (Signed)
Patient ID: Justin Leach, male   DOB: [birth year], 86 y.o.   MRN: [id number] [facility] PROGRESS NOTE  Justin Leach [id number] DOB: July 25, 1931 DOA: 05/30/2019 PCP: Alvester Morin, MD  HPI/Subjective: Patient awakened from sleep.  He was a little bit confused on where he was.  Patient did not sleep very well last night.  Patient wanted his cell phone and we did not see it in the room.  Nursing staff will look into where his cell phone is.  Objective: Vitals:   06/01/19 1016 06/01/19 1600  BP:  (!) 121/32  Pulse: 66 63  Resp:    Temp:  97.9 F (36.6 C)  SpO2:  100%    Filed Weights   05/30/19 2209 05/31/19 2059 06/01/19 0415  Weight: 79.4 kg 83.3 kg 84.7 kg    ROS: Review of Systems  Unable to perform ROS: Acuity of condition   Exam: Physical Exam  HENT:  Nose: No mucosal edema.  Mouth/Throat: No oropharyngeal exudate or posterior oropharyngeal edema.  Eyes: Conjunctivae and lids are normal.  Neck: No JVD present. Carotid bruit is not present. No thyroid mass and no thyromegaly present.  Cardiovascular: S1 normal and S2 normal. Exam reveals no gallop.  No murmur heard. Respiratory: No respiratory distress. He has decreased breath sounds in the right lower field and the left lower field. He has no wheezes. He has rhonchi in the right lower field and the left lower field. He has no rales.  GI: Soft. Bowel sounds are normal. There is no abdominal tenderness.  Musculoskeletal:     Cervical back: No edema.     Right ankle: No swelling.     Left ankle: No swelling.  Lymphadenopathy:    He has no cervical adenopathy.  Neurological: He is alert.  Patient moved all extremites  Skin: Skin is warm. No rash noted. Nails show no clubbing.  Psychiatric:  Confused at times      Data Reviewed: Basic Metabolic Panel: Recent Labs  Lab 05/30/19 2214 05/31/19 0346 06/01/19 1058  NA 141  --  142  K 3.7  --  4.5  CL 102  --  103  CO2 28  --  26   GLUCOSE 92  --  335*  BUN 27*  --  44*  CREATININE 1.98* 2.07* 2.51*  CALCIUM 8.5*  --  8.3*   Liver Function Tests: Recent Labs  Lab 05/30/19 2214 06/01/19 1058  AST 30 32  ALT 19 24  ALKPHOS 102 96  BILITOT 0.7 0.6  PROT 5.5* 5.4*  ALBUMIN 2.9* 2.8*   CBC: Recent Labs  Lab 05/30/19 2214 05/31/19 0346 06/01/19 1058  WBC 7.6 6.4 8.0  HGB 12.5* 11.3* 11.9*  HCT 39.1 36.3* 37.7*  MCV 95.8 97.1 98.4  PLT 216 176 221   BNP (last 3 results) Recent Labs    03/20/19 1958 05/01/19 2049 05/30/19 2214  BNP 199.0* 142.0* 214.0*    Recent Results (from the past 240 hour(s))  Culture, blood (routine x 2)     Status: None (Preliminary result)   Collection Time: 05/30/19 11:15 PM   Specimen: Right Antecubital; Blood  Result Value Ref Range Status   Specimen Description RIGHT ANTECUBITAL  Final   Special Requests Blood Culture adequate volume  Final   Culture   Final    NO GROWTH 1 DAY Performed at [facility], [address], [place], [place] [address]    Report Status PENDING  Incomplete  Culture, blood (routine  x 2)     Status: None (Preliminary result)   Collection Time: 05/30/19 11:15 PM   Specimen: BLOOD RIGHT WRIST  Result Value Ref Range Status   Specimen Description BLOOD RIGHT WRIST  Final   Special Requests   Final    Blood Culture results may not be optimal due to an excessive volume of blood received in culture bottles   Culture   Final    NO GROWTH 1 DAY Performed at [facility], [address], [place], [place] [address]    Report Status PENDING  Incomplete  Respiratory Panel by RT PCR (Flu A&B, Covid) - Nasopharyngeal Swab     Status: Abnormal   Collection Time: 05/31/19 12:32 AM   Specimen: Nasopharyngeal Swab  Result Value Ref Range Status   SARS Coronavirus 2 by RT PCR POSITIVE (A) NEGATIVE Final    Comment: RESULT CALLED TO, READ BACK BY AND VERIFIED WITH: H OARE 05/31/19 AT 0300 HS    Influenza A by PCR NEGATIVE  NEGATIVE Final   Influenza B by PCR NEGATIVE NEGATIVE Final    Comment: (NOTE) The Xpert Xpress SARS-CoV-2/FLU/RSV assay is intended as an aid in  the diagnosis of influenza from Nasopharyngeal swab specimens and  should not be used as a sole basis for treatment. Nasal washings and  aspirates are unacceptable for Xpert Xpress SARS-CoV-2/FLU/RSV  testing. Fact Sheet for Patients: PinkCheek.be Fact Sheet for Healthcare Providers: GravelBags.it This test is not yet approved or cleared by the [place] FDA and  has been authorized for detection and/or diagnosis of SARS-CoV-2 by  FDA under an Emergency Use Authorization (EUA). This EUA will remain  in effect (meaning this test can be used) for the duration of the  Covid-19 declaration under Section 564(b)(1) of the Act, 21  U.S.C. section 360bbb-3(b)(1), unless the authorization is  terminated or revoked. Performed at [facility], [address] [place], [place] [address]      Studies: CT Head Wo Contrast  Result Date: 05/31/2019 CLINICAL DATA:  Change in mental status EXAM: CT HEAD WITHOUT CONTRAST TECHNIQUE: Contiguous axial images were obtained from the base of the skull through the vertex without intravenous contrast. COMPARISON:  March 20, 2019 FINDINGS: Brain: No evidence of acute territorial infarction, hemorrhage, hydrocephalus,extra-axial collection or mass lesion/mass effect. There is dilatation the ventricles and sulci consistent with age-related atrophy. Low-attenuation changes in the deep white matter consistent with small vessel ischemia. Vascular: No hyperdense vessel or unexpected calcification. Skull: The skull is intact. No fracture or focal lesion identified. Sinuses/Orbits: The visualized paranasal sinuses and mastoid air cells are clear. The orbits and globes intact. Other: None IMPRESSION: No acute intracranial abnormality. Findings consistent with  age related atrophy and chronic small vessel ischemia Electronically Signed   By: Prudencio Pair M.D.   On: 05/31/2019 00:05   DG Chest Port 1 View  Result Date: 05/30/2019 CLINICAL DATA:  86 year old male with increased confusion and weakness. EXAM: PORTABLE CHEST 1 VIEW COMPARISON:  Portable chest 05/01/2019 and earlier. FINDINGS: Portable AP semi upright views at 2244 hours. Stable lung volumes and mediastinal contours. Visualized tracheal air column is within normal limits. Ventilation has not significantly changed going back to October portable chest radiograph. No pneumothorax, pulmonary edema or definite effusion. Mild atelectasis or scarring at the left lung base is stable. Stable cholecystectomy clips. Left shoulder arthroplasty. Cervical ACDF. No acute osseous abnormality identified. IMPRESSION: Chronic atelectasis or scarring at the left lung base. No acute cardiopulmonary abnormality. Electronically  Signed   By: Genevie Ann M.D.   On: 05/30/2019 23:08    Scheduled Meds: . albuterol  2 puff Inhalation Q6H  . vitamin C  500 mg Oral Daily  . aspirin  81 mg Oral Daily  . atorvastatin  40 mg Oral QHS  . citalopram  20 mg Oral Daily  . dexamethasone (DECADRON) injection  6 mg Intravenous Q24H  . febuxostat  40 mg Oral Daily  . [START ON 06/02/2019] heparin injection (subcutaneous)  5,000 Units Subcutaneous Q8H  . hydrALAZINE  100 mg Oral TID  . insulin aspart  0-5 Units Subcutaneous QHS  . insulin aspart  0-9 Units Subcutaneous TID WC  . insulin glargine  10 Units Subcutaneous QHS  . isosorbide mononitrate  60 mg Oral Daily  . loratadine  10 mg Oral Daily  . metoprolol tartrate  12.5 mg Oral BID  . multivitamin with minerals  1 tablet Oral Daily  . pantoprazole  40 mg Oral Daily  . QUEtiapine  25 mg Oral QHS  . zinc sulfate  220 mg Oral Daily   Continuous Infusions: . azithromycin Stopped (06/01/19 1027)  . cefTRIAXone (ROCEPHIN)  IV Stopped (06/01/19 1027)  . remdesivir 100 mg in NS  100 mL 100 mg (06/01/19 1032)    Assessment/Plan:  1. Acute hypoxic respiratory failure.  Patient's pulse ox was 86% on room air on presentation.  Looks like he is on room air now. 2. COVID-19 pneumonia.  Continue remdesivir day 2 today.  Continue Decadron, zinc and vitamin C.  Follow inflammatory markers.  Patient on empiric antibiotic 3. Acute delirium from not sleeping last night.  Trial of Seroquel at night.  If this still does not help may have to consider discontinuing the Decadron. 4. Paroxysmal atrial fibrillation.  Patient not on any anticoagulation secondary to subdural hematoma.  I decrease metoprolol secondary to bradycardia. 5. Chronic diastolic congestive heart failure.  No signs of heart failure.  Hold Lasix.  Continue Imdur and hydralazine.  Hold lisinopril for right now 6. Acute on chronic kidney disease stage III.  Monitor off Lasix and lisinopril for right now. 7. Type 2 diabetes mellitus with chronic kidney disease.  Placed on sliding scale insulin.  Last hemoglobin A1c couple months ago was 7.2. 8. Essential hypertension.  Started meds back again hydralazine and metoprolol. 9. History of interstitial lung disease  Code Status:     Code Status Orders  (From admission, onward)         Start     Ordered   05/31/19 0111  Do not attempt resuscitation (DNR)  Continuous    Question Answer Comment  In the event of cardiac or respiratory ARREST Do not call a "code blue"   In the event of cardiac or respiratory ARREST Do not perform Intubation, CPR, defibrillation or ACLS   In the event of cardiac or respiratory ARREST Use medication by any route, position, wound care, and other measures to relive pain and suffering. May use oxygen, suction and manual treatment of airway obstruction as needed for comfort.   Comments per wife linda Kawecki      05/31/19 0113        Code Status History    Date Active Date Inactive Code Status Order ID Comments User Context   05/01/2019  [id number] 05/09/2019 0006 DNR [id number]  Orene Desanctis, DO ED   07/20/2018 1840 07/24/2018 0013 Full Code [id number]  Bettey Costa, MD ED   05/13/2018 2224 05/27/2018 1831 Full Code  [id number]  Ivor Costa, MD ED   10/30/2015 2351 11/05/2015 2307 Full Code [id number]  Toy Baker, MD ED   03/01/2015 0008 03/07/2015 2240 Full Code [id number]  Rise Patience, MD ED   05/11/2013 1354 05/17/2013 1943 Full Code [id number]  Rama, Venetia Maxon, MD Inpatient   Advance Care Planning Activity     Family Communication: Spoke with wife and wife sister on the phone Disposition Plan: To be determined.  Currently lives at [facility] assisted [facility]  Antibiotics:  Rocephin  Zithromax  Time spent: 28 minutes  [place]

## 2019-06-01 NOTE — TOC Initial Note (Signed)
Transition of Care [facility]) - Initial/Assessment Note    Patient Details  Name: Justin Leach MRN: [id number] Date of Birth: Jul 11, 1931  [facility]) CM/SW Contact:    Eileen Stanford, LCSW Phone Number: 06/01/2019, 2:28 PM  Clinical Narrative:  Pt is disoriented. CSW spoke with pt's spouse and pt's spouse's sister via telephone. Pt's spouse states pt has been to [facility] in [place] in the past and did not like it, however among the facilities that can take COVID+ pt's this is the closest one. Pt's spouse said proceed with [facility]. CSW will follow up with facility.                 Expected Discharge Plan: Skilled Nursing Facility Barriers to Discharge: Continued Medical Work up   Patient Goals and CMS Choice Patient states their goals for this hospitalization and ongoing recovery are:: to go home   Choice offered to / list presented to : Adult Children, Spouse  Expected Discharge Plan and Services Expected Discharge Plan: [place] In-house Referral: NA Discharge Planning Services: NA Post Acute Care Choice: [place] Living arrangements for the past 2 months: Assisted Living Facility([facility])                                      Prior Living Arrangements/Services Living arrangements for the past 2 months: Assisted Living Facility([facility]) Lives with:: Self Patient language and need for interpreter reviewed:: Yes Do you feel safe going back to the place where you live?: Yes      Need for Family Participation in Patient Care: Yes (Comment) Care giver support system in place?: Yes (comment)   Criminal Activity/Legal Involvement Pertinent to Current Situation/Hospitalization: No - Comment as needed  Activities of Daily Living Home Assistive Devices/Equipment: Walker (specify type), Eyeglasses ADL Screening (condition at time of admission) Patient's cognitive ability adequate to safely complete daily  activities?: No Is the patient deaf or have difficulty hearing?: No Does the patient have difficulty seeing, even when wearing glasses/contacts?: No Does the patient have difficulty concentrating, remembering, or making decisions?: Yes Patient able to express need for assistance with ADLs?: Yes Does the patient have difficulty dressing or bathing?: Yes Independently performs ADLs?: No Does the patient have difficulty walking or climbing stairs?: Yes Weakness of Legs: Both Weakness of Arms/Hands: Both  Permission Sought/Granted Permission sought to share information with : Family Supports    Share Information with NAME: Vaughan Basta  Permission granted to share info w AGENCY: [facility] in [place] granted to share info w Relationship: Spouse     Emotional Assessment Appearance:: Appears stated age Attitude/Demeanor/Rapport: Unable to Assess Affect (typically observed): Unable to Assess Orientation: : Oriented to Self Alcohol / Substance Use: Not Applicable Psych Involvement: No (comment)  Admission diagnosis:  Hypoxia [R09.02] Acute respiratory failure with hypoxia ([place]) [J96.01] Altered mental status, unspecified altered mental status type [R41.82] COVID-19 [U07.1] Patient Active Problem List   Diagnosis Date Noted  . Acute respiratory failure with hypoxia ([place]) 05/31/2019  . Acute metabolic encephalopathy [id number]  . Pneumonia due to COVID-19 virus   . Hypoxia 05/01/2019  . Interstitial pulmonary disease ([place]) 07/29/2018  . Pneumonia 07/20/2018  . Palliative care encounter 06/26/2018  . Weakness generalized 06/26/2018  . HCAP (healthcare-associated pneumonia) 06/22/2018  . Gouty arthritis of right great toe 06/05/2018  . Hypertensive heart and kidney disease with chronic  diastolic congestive heart failure and stage 3 chronic kidney disease ([place]) 06/01/2018  . Type 2 diabetes mellitus with hyperglycemia, with long-term current use of insulin ([place]) 06/01/2018   . Chronic constipation 06/01/2018  . CKD stage 3 due to type 2 diabetes mellitus ([place]) 06/01/2018  . Volume overload 05/20/2018  . Fall 05/13/2018  . SDH (subdural hematoma) ([place]) 05/13/2018  . Closed comminuted left humeral fracture 05/13/2018  . Hyperkalemia 05/13/2018  . Leukocytosis 05/13/2018  . Fall at home   . Subdural hematoma without coma ([place])   . Cough   . GERD (gastroesophageal reflux disease)   . Pulmonary fibrosis ([place])   . Coronary artery calcification seen on CT scan   . Chronic diastolic CHF (congestive heart failure) ([place])   . PAF (paroxysmal atrial fibrillation) ([place])   . CKD (chronic kidney disease), stage III ([place])   . Type II diabetes mellitus with renal manifestations ([place])   . Essential hypertension   . Chest pain 02/28/2015  . Acute kidney injury superimposed on chronic kidney disease ([place]) 02/28/2015  . Hyperlipidemia 02/28/2015  . SOB (shortness of breath) 03/29/2014  . DOE (dyspnea on exertion) 03/29/2014  . Bradycardia 03/29/2014  . Bronchospasm 05/15/2013   PCP:  Alvester Morin, MD Pharmacy:   [facility], [address] [place] [address] Phone: [phone] Fax: [phone]     Social Determinants of Health (SDOH) Interventions    Readmission Risk Interventions Readmission Risk Prevention Plan 05/03/2019  Transportation Screening Complete  HRI or [place] Not Complete  HRI or Home Care Consult comments Pt going to SNF rehab  Social Work Consult for [place] Planning/Counseling Complete  Palliative Care Screening Not Applicable  Medication Review Press photographer) Complete  Some recent data might be hidden

## 2019-06-01 NOTE — Evaluation (Signed)
Physical Therapy Evaluation Patient Details Name: Justin Leach MRN: [id number] DOB: Jul 21, 1931 Today's Date: 06/01/2019   History of Present Illness  Patient with PMH of bradycardia,CHF, CKDIII, GERD, HLD, ILD, afib, DMII, SOB with exertion, cervical fusion, joint replacements of bilateral knees, elbows, and shoulders presented to ED with worsening confusion above his baseline from [facility] (ALF).    Clinical Impression  Patient alert, oriented to self, able to follow one step commands consistently, did need repetition. Pt unable to provide PLOF information, chart review reported pt lives at [facility], ambulates short distances with rolling walker, assistance needed for ADLs.   The patient was able to mobilize all extremities against gravity well, participated in bed level therapeutic exercises with verbal/tactile cues. PT informed the patient that they were going to attempt to sit up, and patient closed eyes and feigned sleep. With extensive cueing pt re-opened eyes and supine to sit attempted.  2+ attempt to initiate movement, pt very resistant and then began yelling in pain. Unclear if pain was main limitation against mobility or mental status. Repositioned in supine. Pt was able to roll modA to L and R to allow for linen repositioning.  Overall the patient demonstrated deficits (see "PT Problem List") that impede the patient's functional abilities, safety, and mobility and would benefit from skilled PT intervention. Recommendation is STR due to current level of assistance needed. If ALF is able to provide enough physical assistance and supervision, pt may be able to return to [facility], but would benefit from SNF to maximize mobility, safety, and independence.      Follow Up Recommendations SNF    Equipment Recommendations  Other (comment)(TBD)    Recommendations for Other Services       Precautions / Restrictions Precautions Precautions: Fall Restrictions Weight Bearing  Restrictions: No      Mobility  Bed Mobility Overal bed mobility: Needs Assistance Bed Mobility: Supine to Sit;Rolling Rolling: Mod assist;+2 for safety/equipment         General bed mobility comments: supine to sit attempted. Pt feigned falling asleep; 2+ attempt to initiate movement, pt very resistant and then began yelling in pain. Repositioned in supine. Performed rolling modA, pt resistant to movement.  Transfers                 General transfer comment: deferred  Ambulation/Gait                Stairs            Wheelchair Mobility    Modified Rankin (Stroke Patients Only)       Balance                                             Pertinent Vitals/Pain Pain Assessment: Faces Faces Pain Scale: Hurts little more Pain Location: pt gestures to R leg/genital area with mobility attempts Pain Descriptors / Indicators: Grimacing;Moaning;Crying Pain Intervention(s): Limited activity within patient's tolerance    Home Living Family/patient expects to be discharged to:: Assisted living                 Additional Comments: Pt unable to provide history, information taken from chart review    Prior Function Level of [place]: Needs assistance   Gait / Transfers Assistance Needed: Pt able to amb short distances with a RW; per chart indicating decline over past month  ADL's / Homemaking  Assistance Needed: Assist with ADLs from staff recently  Comments: Ambulatory for short-distances with RW, assist from facility staff for ADLs     Hand Dominance        Extremity/Trunk Assessment   Upper Extremity Assessment Upper Extremity Assessment: (Pt able to move all extremities against gravity)    Lower Extremity Assessment Lower Extremity Assessment: (Pt able to move all extremities against gravity)       Communication   Communication: HOH  Cognition Arousal/Alertness: Awake/alert Behavior During Therapy: WFL for  tasks assessed/performed Overall Cognitive Status: No family/caregiver present to determine baseline cognitive functioning                                 General Comments: Pt oriented to himself, place, disoriented to situation, time. Able to follow one step commands consistently with repetition      General Comments      Exercises General Exercises - Lower Extremity Ankle Circles/Pumps: AROM;Strengthening;Both;10 reps Heel Slides: AROM;Strengthening;Both;10 reps Hip ABduction/ADduction: AROM;Strengthening;Both;5 reps Straight Leg Raises: AROM;Strengthening;Both;5 reps   Assessment/Plan    PT Assessment Patient needs continued PT services  PT Problem List Decreased strength;Decreased mobility;Decreased safety awareness;Decreased activity tolerance;Decreased balance;Pain;Decreased knowledge of use of DME       PT Treatment Interventions DME instruction;Therapeutic exercise;Gait training;Balance training;Neuromuscular re-education;Therapeutic activities;Patient/family education    PT Goals (Current goals can be found in the Care Plan section)  Acute Rehab PT Goals Patient Stated Goal: to rest PT Goal Formulation: With patient Time For Goal Achievement: 06/15/19 Potential to Achieve Goals: Good    Frequency Min 2X/week   Barriers to discharge        Co-evaluation               AM-PAC PT "6 Clicks" Mobility  Outcome Measure Help needed turning from your back to your side while in a flat bed without using bedrails?: A Little Help needed moving from lying on your back to sitting on the side of a flat bed without using bedrails?: A Lot Help needed moving to and from a bed to a chair (including a wheelchair)?: Total Help needed standing up from a chair using your arms (e.g., wheelchair or bedside chair)?: Total Help needed to walk in hospital room?: Total Help needed climbing 3-5 steps with a railing? : Total 6 Click Score: 9    End of Session  Equipment Utilized During Treatment: Oxygen Activity Tolerance: Patient limited by pain;Other (comment)(mental status) Patient left: in bed;with call bell/phone within reach;with bed alarm set Nurse Communication: Mobility status PT Visit Diagnosis: Other abnormalities of gait and mobility (R26.89);Pain;Muscle weakness (generalized) (M62.81) Pain - Right/Left: Right Pain - part of body: Leg    Time: [id number] PT Time Calculation (min) (ACUTE ONLY): 21 min   Charges:   PT Evaluation $PT Eval Low Complexity: 1 Low PT Treatments $Therapeutic Exercise: 8-22 mins        Lieutenant Diego PT, DPT 2:06 PM,06/01/19

## 2019-06-01 NOTE — Progress Notes (Signed)
Inpatient Diabetes Program Recommendations  AACE/ADA: New Consensus Statement on Inpatient Glycemic Control (2015)  Target Ranges:  Prepandial:   less than 140 mg/dL      Peak postprandial:   less than 180 mg/dL (1-2 hours)      Critically ill patients:  140 - 180 mg/dL   Results for Justin Leach, Justin Leach (MRN [id number]) as of 06/01/2019 15:01  Ref. Range 05/31/2019 17:28 05/31/2019 21:12 06/01/2019 08:33 06/01/2019 11:44  Glucose-Capillary Latest Ref Range: 70 - 99 mg/dL 300 (H)  5 units NOVOLOG  258 (H)  3 units NOVOLOG +  10 units LANTUS 250 (H)  5 units NOVOLOG  350 (H)  7 units NOVOLOG     Admit with: COVID-19 pneumonia  History: DM, CKD, Interstitial lung disease, CHF  Home DM Meds: Lantus 10 units QHS       Novolog 8 units QID       Novolog 2-12 units per SSI  Current Orders: Lantus 10 units QHS      Novolog Sensitive Correction Scale/ SSI (0-9 units) TID AC + HS      Getting Decadron 6 mg Daily.   MD- Please consider:  1. Increase Lantus to 15 units QHS  2. Start Novolog Meal Coverage: Novolog 8 units TID with meals  (Please add the following Hold Parameters: Hold if pt eats <50% of meal, Hold if pt NPO)    --Will follow patient during hospitalization--  Wyn Quaker RN, MSN, CDE Diabetes Coordinator Inpatient Glycemic Control Team Team Pager: [phone] (8a-5p)

## 2019-06-01 NOTE — Plan of Care (Signed)
  Problem: Skin Integrity: Goal: Risk for impaired skin integrity will decrease Outcome: Progressing   Problem: Safety: Goal: Ability to remain free from injury will improve Outcome: Progressing   Problem: Clinical Measurements: Goal: Respiratory complications will improve Outcome: Progressing

## 2019-06-01 NOTE — NC FL2 (Signed)
[place] LEVEL OF CARE SCREENING TOOL     IDENTIFICATION  Patient Name: Justin Leach Birthdate: [birth year] Sex: male Admission Date (Current Location): 05/30/2019  [place] and [place] Number:  Engineering geologist and Address:  [facility], [address], [place], [place] [address]      Provider Number: [id number]  Attending Physician Name and Address:  Loletha Grayer, MD  Relative Name and Phone Number:       Current Level of Care: Hospital Recommended Level of Care: [place] Prior Approval Number:    Date Approved/Denied:   PASRR Number: [id number]  Discharge Plan: SNF    Current Diagnoses: Patient Active Problem List   Diagnosis Date Noted  . Acute respiratory failure with hypoxia ([place]) 05/31/2019  . Acute metabolic encephalopathy [id number]  . Pneumonia due to COVID-19 virus   . Hypoxia 05/01/2019  . Interstitial pulmonary disease ([place]) 07/29/2018  . Pneumonia 07/20/2018  . Palliative care encounter 06/26/2018  . Weakness generalized 06/26/2018  . HCAP (healthcare-associated pneumonia) 06/22/2018  . Gouty arthritis of right great toe 06/05/2018  . Hypertensive heart and kidney disease with chronic diastolic congestive heart failure and stage 3 chronic kidney disease ([place]) 06/01/2018  . Type 2 diabetes mellitus with hyperglycemia, with long-term current use of insulin ([place]) 06/01/2018  . Chronic constipation 06/01/2018  . CKD stage 3 due to type 2 diabetes mellitus ([place]) 06/01/2018  . Volume overload 05/20/2018  . Fall 05/13/2018  . SDH (subdural hematoma) ([place]) 05/13/2018  . Closed comminuted left humeral fracture 05/13/2018  . Hyperkalemia 05/13/2018  . Leukocytosis 05/13/2018  . Fall at home   . Subdural hematoma without coma ([place])   . Cough   . GERD (gastroesophageal reflux disease)   . Pulmonary fibrosis ([place])   . Coronary artery calcification seen on CT scan   . Chronic diastolic CHF  (congestive heart failure) ([place])   . PAF (paroxysmal atrial fibrillation) ([place])   . CKD (chronic kidney disease), stage III ([place])   . Type II diabetes mellitus with renal manifestations ([place])   . Essential hypertension   . Chest pain 02/28/2015  . Acute kidney injury superimposed on chronic kidney disease ([place]) 02/28/2015  . Hyperlipidemia 02/28/2015  . SOB (shortness of breath) 03/29/2014  . DOE (dyspnea on exertion) 03/29/2014  . Bradycardia 03/29/2014  . Bronchospasm 05/15/2013    Orientation RESPIRATION BLADDER Height & Weight     Self, Time, Situation  O2(Nasal Cannula 3L) Incontinent Weight: 186 lb 11.2 oz (84.7 kg) Height:  5\' 11"  (180.3 cm)  BEHAVIORAL SYMPTOMS/MOOD NEUROLOGICAL BOWEL NUTRITION STATUS      Continent Diet  AMBULATORY STATUS COMMUNICATION OF NEEDS Skin   Extensive Assist Verbally Normal                       Personal Care Assistance Level of Assistance  Bathing, Dressing, Feeding Bathing Assistance: Maximum assistance Feeding assistance: Limited assistance Dressing Assistance: Maximum assistance     Functional Limitations Info  Sight, Hearing, Speech Sight Info: Adequate Hearing Info: Adequate Speech Info: Adequate    SPECIAL CARE FACTORS FREQUENCY  PT (By licensed PT), OT (By licensed OT)     PT Frequency: 5x OT Frequency: 5x            Contractures Contractures Info: Not present    Additional Factors Info  Code Status, Allergies, Isolation Precautions Code Status Info: DNR Allergies Info: Amiodarone, Nifedipine     Isolation Precautions Info:  COVID+     Current Medications (06/01/2019):  This is the current hospital active medication list Current Facility-Administered Medications  Medication Dose Route Frequency Provider Last Rate Last Admin  . albuterol (VENTOLIN HFA) 108 (90 Base) MCG/ACT inhaler 2 puff  2 puff Inhalation Q6H Loletha Grayer, MD   2 puff at 06/01/19 1306  . ascorbic acid (VITAMIN C) tablet 500 mg  500 mg  Oral Daily Loletha Grayer, MD   500 mg at 06/01/19 1016  . aspirin chewable tablet 81 mg  81 mg Oral Daily Loletha Grayer, MD   81 mg at 06/01/19 1015  . atorvastatin (LIPITOR) tablet 40 mg  40 mg Oral QHS Loletha Grayer, MD   40 mg at 05/31/19 2153  . azithromycin (ZITHROMAX) 500 mg in sodium chloride 0.9 % 250 mL IVPB  500 mg Intravenous Q24H Loletha Grayer, MD   Stopped at 06/01/19 1027  . cefTRIAXone (ROCEPHIN) 2 g in sodium chloride 0.9 % 100 mL IVPB  2 g Intravenous Q24H Loletha Grayer, MD   Stopped at 06/01/19 1027  . citalopram (CELEXA) tablet 20 mg  20 mg Oral Daily Loletha Grayer, MD   20 mg at 06/01/19 1016  . dexamethasone (DECADRON) injection 6 mg  6 mg Intravenous Q24H Loletha Grayer, MD   6 mg at 06/01/19 0408  . febuxostat (ULORIC) tablet 40 mg  40 mg Oral Daily Loletha Grayer, MD   40 mg at 06/01/19 1024  . guaiFENesin-dextromethorphan (ROBITUSSIN DM) 100-10 MG/5ML syrup 10 mL  10 mL Oral Q4H PRN Loletha Grayer, MD      . Derrill Memo ON 06/02/2019] heparin injection 5,000 Units  5,000 Units Subcutaneous Q8H Wieting, Richard, MD      . hydrALAZINE (APRESOLINE) tablet 100 mg  100 mg Oral TID Loletha Grayer, MD   100 mg at 06/01/19 1015  . insulin aspart (novoLOG) injection 0-5 Units  0-5 Units Subcutaneous QHS Loletha Grayer, MD   3 Units at 05/31/19 2154  . insulin aspart (novoLOG) injection 0-9 Units  0-9 Units Subcutaneous TID WC Loletha Grayer, MD   7 Units at 06/01/19 1306  . insulin glargine (LANTUS) injection 10 Units  10 Units Subcutaneous QHS Loletha Grayer, MD   10 Units at 05/31/19 2152  . isosorbide mononitrate (IMDUR) 24 hr tablet 60 mg  60 mg Oral Daily Loletha Grayer, MD   60 mg at 06/01/19 1016  . loratadine (CLARITIN) tablet 10 mg  10 mg Oral Daily Loletha Grayer, MD   10 mg at 06/01/19 1016  . metoprolol tartrate (LOPRESSOR) tablet 12.5 mg  12.5 mg Oral BID Loletha Grayer, MD   12.5 mg at 06/01/19 1016  . multivitamin with minerals tablet 1  tablet  1 tablet Oral Daily Loletha Grayer, MD   1 tablet at 06/01/19 1015  . nitroGLYCERIN (NITROSTAT) SL tablet 0.4 mg  0.4 mg Sublingual Q5 min PRN Wieting, Richard, MD      . pantoprazole (PROTONIX) EC tablet 40 mg  40 mg Oral Daily Loletha Grayer, MD   40 mg at 06/01/19 1017  . polyethylene glycol (MIRALAX / GLYCOLAX) packet 17 g  17 g Oral Daily PRN Loletha Grayer, MD      . remdesivir 100 mg in sodium chloride 0.9 % 100 mL IVPB  100 mg Intravenous Daily Loletha Grayer, MD 200 mL/hr at 06/01/19 1032 100 mg at 06/01/19 1032  . zinc sulfate capsule 220 mg  220 mg Oral Daily Loletha Grayer, MD   220 mg at 06/01/19 1016  Discharge Medications: Please see discharge summary for a list of discharge medications.  Relevant Imaging Results:  Relevant Lab Results:   Additional Information [id number]  Eileen Stanford, LCSW

## 2019-06-01 NOTE — Progress Notes (Signed)
Patient weaned from 2L [place] to room air. Patient O2 saturation between 99-100% on room air. Patient states "he hasa no SOB". Will continue to monitor.

## 2019-06-02 DIAGNOSIS — N179 Acute kidney failure, unspecified: Secondary | ICD-10-CM

## 2019-06-02 DIAGNOSIS — U071 COVID-19: Principal | ICD-10-CM

## 2019-06-02 DIAGNOSIS — L8990 Pressure ulcer of unspecified site, unspecified stage: Secondary | ICD-10-CM | POA: Insufficient documentation

## 2019-06-02 DIAGNOSIS — N189 Chronic kidney disease, unspecified: Secondary | ICD-10-CM

## 2019-06-02 DIAGNOSIS — G9341 Metabolic encephalopathy: Secondary | ICD-10-CM

## 2019-06-02 LAB — COMPREHENSIVE METABOLIC PANEL
ALT: 18 U/L (ref 0–44)
AST: 20 U/L (ref 15–41)
Albumin: 2.7 g/dL — ABNORMAL LOW (ref 3.5–5.0)
Alkaline Phosphatase: 77 U/L (ref 38–126)
Anion gap: 9 (ref 5–15)
BUN: 55 mg/dL — ABNORMAL HIGH (ref 8–23)
CO2: 27 mmol/L (ref 22–32)
Calcium: 8.4 mg/dL — ABNORMAL LOW (ref 8.9–10.3)
Chloride: 106 mmol/L (ref 98–111)
Creatinine, Ser: 2.71 mg/dL — ABNORMAL HIGH (ref 0.61–1.24)
GFR calc Af Amer: 24 mL/min — ABNORMAL LOW (ref >60)
GFR calc non Af Amer: 20 mL/min — ABNORMAL LOW (ref >60)
Glucose, Bld: 231 mg/dL — ABNORMAL HIGH (ref 70–99)
Potassium: 4.4 mmol/L (ref 3.5–5.1)
Sodium: 142 mmol/L (ref 135–145)
Total Bilirubin: 0.5 mg/dL (ref 0.3–1.2)
Total Protein: 5.2 g/dL — ABNORMAL LOW (ref 6.5–8.1)

## 2019-06-02 LAB — CBC
HCT: 34.8 % — ABNORMAL LOW (ref 39.0–52.0)
Hemoglobin: 11 g/dL — ABNORMAL LOW (ref 13.0–17.0)
MCH: 30.5 pg (ref 26.0–34.0)
MCHC: 31.6 g/dL (ref 30.0–36.0)
MCV: 96.4 fL (ref 80.0–100.0)
Platelets: 197 10*3/uL (ref 150–400)
RBC: 3.61 MIL/uL — ABNORMAL LOW (ref 4.22–5.81)
RDW: 13.4 % (ref 11.5–15.5)
WBC: 6.9 10*3/uL (ref 4.0–10.5)
nRBC: 0 % (ref 0.0–0.2)

## 2019-06-02 LAB — GLUCOSE, CAPILLARY
Glucose-Capillary: 192 mg/dL — ABNORMAL HIGH (ref 70–99)
Glucose-Capillary: 295 mg/dL — ABNORMAL HIGH (ref 70–99)
Glucose-Capillary: 237 mg/dL — ABNORMAL HIGH (ref 70–99)
Glucose-Capillary: 157 mg/dL — ABNORMAL HIGH (ref 70–99)

## 2019-06-02 LAB — FERRITIN: Ferritin: 142 ng/mL (ref 24–336)

## 2019-06-02 LAB — C-REACTIVE PROTEIN: CRP: 2.2 mg/dL — ABNORMAL HIGH (ref <1.0)

## 2019-06-02 LAB — FIBRIN DERIVATIVES D-DIMER (ARMC ONLY): Fibrin derivatives D-dimer (ARMC): 1901.09 ng/mL (FEU) — ABNORMAL HIGH (ref 0.00–499.00)

## 2019-06-02 MED ORDER — CEFDINIR 300 MG PO CAPS
300.0000 mg | ORAL_CAPSULE | Freq: Two times a day (BID) | ORAL | Status: AC
  Administered 2019-06-03 – 2019-06-04 (×4): 300 mg via ORAL
  Filled 2019-06-02 (×4): qty 1

## 2019-06-02 MED ORDER — INSULIN GLARGINE 100 UNIT/ML ~~LOC~~ SOLN
15.0000 [IU] | Freq: Every day | SUBCUTANEOUS | Status: DC
  Administered 2019-06-02: 21:00:00 15 [IU] via SUBCUTANEOUS
  Filled 2019-06-02 (×3): qty 0.15

## 2019-06-02 MED ORDER — INSULIN ASPART 100 UNIT/ML ~~LOC~~ SOLN
8.0000 [IU] | Freq: Three times a day (TID) | SUBCUTANEOUS | Status: DC
  Administered 2019-06-02 – 2019-06-03 (×5): 8 [IU] via SUBCUTANEOUS
  Filled 2019-06-02 (×5): qty 1

## 2019-06-02 MED ORDER — AZITHROMYCIN 250 MG PO TABS
500.0000 mg | ORAL_TABLET | Freq: Every day | ORAL | Status: AC
  Administered 2019-06-02 – 2019-06-04 (×3): 500 mg via ORAL
  Filled 2019-06-02 (×3): qty 2

## 2019-06-02 NOTE — Plan of Care (Signed)
  Problem: Education: Goal: Knowledge of General Education information will improve Description: Including pain rating scale, medication(s)/side effects and non-pharmacologic comfort measures Outcome: Not Progressing   

## 2019-06-02 NOTE — TOC Progression Note (Signed)
Transition of Care [facility]) - Progression Note    Patient Details  Name: Justin Leach MRN: [id number] Date of Birth: 05/03/1932  [facility]) CM/SW Contact  Eileen Stanford, LCSW Phone Number: 06/02/2019, 10:17 AM  Clinical Narrative:  Pt's wife would prefer pt return to [facility]. CSW awaiting PT eval today to determine.  CSW will send updated PT eval to ALF once completed.    Expected Discharge Plan: Skilled Nursing Facility Barriers to Discharge: Continued Medical Work up  Expected Discharge Plan and Services Expected Discharge Plan: [place] In-house Referral: NA Discharge Planning Services: NA Post Acute Care Choice: [place] Living arrangements for the past 2 months: Assisted Living Facility([facility])                                       Social Determinants of Health (SDOH) Interventions    Readmission Risk Interventions Readmission Risk Prevention Plan 05/03/2019  Transportation Screening Complete  HRI or [place] Not Complete  HRI or Home Care Consult comments Pt going to SNF rehab  Social Work Consult for [place] Planning/Counseling [place] Not Applicable  Medication Review Press photographer) Complete  Some recent data might be hidden

## 2019-06-02 NOTE — Progress Notes (Signed)
Inpatient Diabetes Program Recommendations  AACE/ADA: New Consensus Statement on Inpatient Glycemic Control (2015)  Target Ranges:  Prepandial:   less than 140 mg/dL      Peak postprandial:   less than 180 mg/dL (1-2 hours)      Critically ill patients:  140 - 180 mg/dL  Results for Justin, Leach (MRN [id number]) as of 06/02/2019 07:33  Ref. Range 06/01/2019 08:33 06/01/2019 11:44 06/01/2019 16:31 06/01/2019 20:12  Glucose-Capillary Latest Ref Range: 70 - 99 mg/dL 250 (H)  5 units NOVOLOG given at 10:26am 350 (H)  7 units NOVOLOG  350 (H)  7 units NOVOLOG  299 (H)  3 units NOVOLOG +  10 units LANTUS    Results for Justin, Leach (MRN [id number]) as of 06/02/2019 07:33  Ref. Range 06/02/2019 04:05  Glucose Latest Ref Range: 70 - 99 mg/dL 231 (H)    Admit with: COVID-19 pneumonia  History: DM, CKD, Interstitial lung disease, CHF  Home DM Meds: Lantus 10 units QHS                             Novolog 8 units QID                             Novolog 2-12 units per SSI  Current Orders: Lantus 10 units QHS                            Novolog Sensitive Correction Scale/ SSI (0-9 units) TID AC + HS      Getting Decadron 6 mg Daily.   MD- Please consider:  1. Increase Lantus to 15 units QHS  2. Start Novolog Meal Coverage: Novolog 8 units TID with meals (home dose)  (Please add the following Hold Parameters: Hold if pt eats <50% of meal, Hold if pt NPO)    --Will follow patient during hospitalization--  Wyn Quaker RN, MSN, CDE Diabetes Coordinator Inpatient Glycemic Control Team Team Pager: [phone] (8a-5p)

## 2019-06-02 NOTE — Progress Notes (Signed)
1900 Report from day RN  2100 Shift assessment and PM medications given. Condom cath in place. Room air. Bed alarmed. Call bell in reach.

## 2019-06-02 NOTE — Progress Notes (Signed)
[place] at [place] NAME: Justin Leach    MR#:  [id number]  DATE OF BIRTH:  1931-07-05  SUBJECTIVE:   Patient very hard on hearing. Not much information obtained from RN. Currently on half a liter of oxygen. Sats are 98%. REVIEW OF SYSTEMS:   Review of Systems  Constitutional: Negative for chills, fever and weight loss.  HENT: Positive for hearing loss. Negative for ear discharge, ear pain and nosebleeds.   Eyes: Negative for blurred vision, pain and discharge.  Respiratory: Positive for shortness of breath. Negative for sputum production, wheezing and stridor.   Cardiovascular: Negative for chest pain, palpitations, orthopnea and PND.  Gastrointestinal: Negative for abdominal pain, diarrhea, nausea and vomiting.  Genitourinary: Negative for frequency and urgency.  Musculoskeletal: Negative for back pain and joint pain.  Neurological: Negative for sensory change, speech change, focal weakness and weakness.  Psychiatric/Behavioral: Negative for depression and hallucinations. The patient is not nervous/anxious.    Tolerating Diet: yes Tolerating PT: rehab  DRUG ALLERGIES:   Allergies  Allergen Reactions  . 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 Patient subsequently taken off amiodarone.  . Nifedipine Other (See Comments)    Reaction: made gums swell up. Pt states that he had to have gum surgery after taking.     VITALS:  Blood pressure (!) 188/57, pulse 74, temperature 98 F (36.7 C), temperature source Oral, resp. rate 16, height 5\' 11"  (1.803 m), weight 84.6 kg, SpO2 98 %.  PHYSICAL EXAMINATION:   Physical Exam  GENERAL:  86 y.o.-year-old patient lying in the bed with no acute distress.  EYES: Pupils equal, round, reactive to light and accommodation. No scleral icterus. Extraocular muscles intact.  HEENT: Head  atraumatic, normocephalic. Oropharynx and nasopharynx clear.  NECK:  Supple, no jugular venous distention. No thyroid enlargement, no tenderness.  LUNGS: decreased breath sounds bilaterally, no wheezing, rales, rhonchi. No use of accessory muscles of respiration.  CARDIOVASCULAR: S1, S2 normal. No murmurs, rubs, or gallops.  ABDOMEN: Soft, nontender, nondistended. Bowel sounds present. No organomegaly or mass.  EXTREMITIES: No cyanosis, clubbing or edema b/l.    NEUROLOGIC: limited exam. Moves all extremities well. PSYCHIATRIC:  patient is alert very hard on hearing. Likely has some baseline confusion SKIN: No obvious rash, lesion, or ulcer.   LABORATORY PANEL:  CBC Recent Labs  Lab 06/02/19 0405  WBC 6.9  HGB 11.0*  HCT 34.8*  PLT 197    Chemistries  Recent Labs  Lab 06/02/19 0405  NA 142  K 4.4  CL 106  CO2 27  GLUCOSE 231*  BUN 55*  CREATININE 2.71*  CALCIUM 8.4*  AST 20  ALT 18  ALKPHOS 77  BILITOT 0.5   Cardiac Enzymes No results for input(s): TROPONINI in the last 168 hours. RADIOLOGY:  No results found. ASSESSMENT AND PLAN:  Justin Leach is a 86 y.o. male with medical history significant for  interstitial lung disease not on chronic oxygen, paroxysmal atrial fibrillation,hx of subdural hematoma,chronic diastolic heart failure, CKD stage III, type 2 diabetes, hypertension who presents with worsening confusion above his baseline  1. Acute hypoxic respiratory failure.  Patient's pulse ox was 86% on room air on presentation.  Looks like he is on half a liter of oxygen sats are 98 -99%. No respiratory distress.  2. COVID-19 pneumonia.  Continue remdesivir day 3  today.  Continue  Decadron, zinc and vitamin C.  Follow inflammatory markers.  Patient on empiric antibiotic-- change to oral.  3. Acute delirium from not sleeping last night.  Trial of Seroquel at night. He was to have helped.  4. Paroxysmal atrial fibrillation.  Patient not on any anticoagulation  secondary to subdural hematoma.  I decrease metoprolol secondary to bradycardia.  5. Chronic diastolic congestive heart failure.  No signs of heart failure.  Hold Lasix.  Continue Imdur and hydralazine.  Hold lisinopril for right now  6. Acute on chronic kidney disease stage III.  Monitor off Lasix and lisinopril for right now. -Creatinine 2.71. -Consider nephrology consultation if creatinine continues to rise. -Oral fluids  7. Type 2 diabetes mellitus with chronic kidney disease.  Placed on sliding scale insulin.  Last hemoglobin A1c couple months ago was 7.2.  8. Essential hypertension.  Started meds back again hydralazine and metoprolol.  9. History of interstitial lung disease  Seen by physical therapy recommends rehab.   Procedures: none Family communication : with wife on the phone Consults : none Discharge Disposition : rehab versus [facility] CODE STATUS: DNR prior to admission DVT Prophylaxis : heparin  TOTAL TIME TAKING CARE OF THIS PATIENT: *25* minutes.  >50% time spent on counselling and coordination of care  Note: This dictation was prepared with Dragon dictation along with smaller phrase technology. Any transcriptional errors that result from this process are unintentional.  Fritzi Mandes M.D on 06/02/2019 at 9:46 AM  Between 7am to 6pm - Pager - [phone]  After 6pm go to [email or web address]  [facility]   CC: Primary care physician; Alvester Morin, MDPatient ID: Justin Leach, male   DOB: January 17, 1932, 86 y.o.   MRN: [id number]

## 2019-06-03 LAB — COMPREHENSIVE METABOLIC PANEL
ALT: 18 U/L (ref 0–44)
AST: 19 U/L (ref 15–41)
Albumin: 2.8 g/dL — ABNORMAL LOW (ref 3.5–5.0)
Alkaline Phosphatase: 78 U/L (ref 38–126)
Anion gap: 8 (ref 5–15)
BUN: 63 mg/dL — ABNORMAL HIGH (ref 8–23)
CO2: 31 mmol/L (ref 22–32)
Calcium: 8.8 mg/dL — ABNORMAL LOW (ref 8.9–10.3)
Chloride: 106 mmol/L (ref 98–111)
Creatinine, Ser: 2.79 mg/dL — ABNORMAL HIGH (ref 0.61–1.24)
GFR calc Af Amer: 23 mL/min — ABNORMAL LOW (ref >60)
GFR calc non Af Amer: 20 mL/min — ABNORMAL LOW (ref >60)
Glucose, Bld: 72 mg/dL (ref 70–99)
Potassium: 4.6 mmol/L (ref 3.5–5.1)
Sodium: 145 mmol/L (ref 135–145)
Total Bilirubin: 0.6 mg/dL (ref 0.3–1.2)
Total Protein: 4.8 g/dL — ABNORMAL LOW (ref 6.5–8.1)

## 2019-06-03 LAB — GLUCOSE, CAPILLARY
Glucose-Capillary: 157 mg/dL — ABNORMAL HIGH (ref 70–99)
Glucose-Capillary: 242 mg/dL — ABNORMAL HIGH (ref 70–99)
Glucose-Capillary: 39 mg/dL — CL (ref 70–99)
Glucose-Capillary: 53 mg/dL — ABNORMAL LOW (ref 70–99)
Glucose-Capillary: 64 mg/dL — ABNORMAL LOW (ref 70–99)
Glucose-Capillary: 85 mg/dL (ref 70–99)

## 2019-06-03 LAB — C-REACTIVE PROTEIN: CRP: 1.5 mg/dL — ABNORMAL HIGH (ref <1.0)

## 2019-06-03 LAB — CBC
HCT: 34.1 % — ABNORMAL LOW (ref 39.0–52.0)
Hemoglobin: 10.9 g/dL — ABNORMAL LOW (ref 13.0–17.0)
MCH: 30.4 pg (ref 26.0–34.0)
MCHC: 32 g/dL (ref 30.0–36.0)
MCV: 95.3 fL (ref 80.0–100.0)
Platelets: 205 10*3/uL (ref 150–400)
RBC: 3.58 MIL/uL — ABNORMAL LOW (ref 4.22–5.81)
RDW: 13.4 % (ref 11.5–15.5)
WBC: 8.3 10*3/uL (ref 4.0–10.5)
nRBC: 0 % (ref 0.0–0.2)

## 2019-06-03 LAB — FERRITIN: Ferritin: 134 ng/mL (ref 24–336)

## 2019-06-03 LAB — FIBRIN DERIVATIVES D-DIMER (ARMC ONLY): Fibrin derivatives D-dimer (ARMC): 1457.11 ng/mL (FEU) — ABNORMAL HIGH (ref 0.00–499.00)

## 2019-06-03 MED ORDER — DEXAMETHASONE 4 MG PO TABS
6.0000 mg | ORAL_TABLET | Freq: Every day | ORAL | Status: DC
  Administered 2019-06-03 – 2019-06-05 (×3): 6 mg via ORAL
  Filled 2019-06-03 (×3): qty 1.5

## 2019-06-03 NOTE — Progress Notes (Signed)
Physical Therapy Treatment Patient Details Name: Justin Leach MRN: [id number] DOB: [birth year] Today's Date: 06/03/2019    History of Present Illness Patient with PMH of bradycardia,CHF, CKDIII, GERD, HLD, ILD, afib, DMII, SOB with exertion, cervical fusion, joint replacements of bilateral knees, elbows, and shoulders presented to ED with worsening confusion above his baseline from [facility] (ALF).    PT Comments    Patient overall did better this session, but still needed significant assistance with mobility and constant encouragement/multimodal cues for sequencing. Supine to sit with maxA, able to progress to sit EOB with MaxA and then supervision once midline was achieved. Sit <> Stand with RW and modA, +2 assist for safety. The patient was able to take a few steps towards the recliner in room with modA, as well as assistance with RW management, maxA for controlled lowering into chair. Patient with RN at bedside in no acute distress at end of session. Current recommendation remains appropriate due to current level of assistance needed.     Follow Up Recommendations  SNF     Equipment Recommendations  Other (comment)    Recommendations for Other Services       Precautions / Restrictions Precautions Precautions: Fall Restrictions Weight Bearing Restrictions: No    Mobility  Bed Mobility Overal bed mobility: Needs Assistance Bed Mobility: Supine to Sit Rolling: Max assist         General bed mobility comments: maxA for LE management and trunk elevation  Transfers Overall transfer level: Needs assistance Equipment used: Rolling walker (2 wheeled) Transfers: Sit to/from Stand Sit to Stand: Mod assist;From elevated surface;+2 safety/equipment         General transfer comment: PT stabilized RW and provided modA for physical assist. Verbal cues for sequencing, pt relies heavily on RW.  Ambulation/Gait             General Gait Details: Patient took several  steps to recliner modA throughout including for RW management. maxA for controlled lowering into recliner at bedside.   Stairs             Wheelchair Mobility    Modified Rankin (Stroke Patients Only)       Balance Overall balance assessment: Needs assistance Sitting-balance support: Feet supported Sitting balance-Leahy Scale: Fair       Standing balance-Leahy Scale: Zero                              Cognition Arousal/Alertness: Awake/alert Behavior During Therapy: WFL for tasks assessed/performed Overall Cognitive Status: No family/caregiver present to determine baseline cognitive functioning                                 General Comments: Pt oriented to himself, place, disoriented to situation, time. Able to follow one step commands consistently with repetition      Exercises      General Comments        Pertinent Vitals/Pain Pain Assessment: Faces Faces Pain Scale: Hurts little more Pain Location: complained of "rear end" pain Pain Descriptors / Indicators: Grimacing;Moaning;Crying Pain Intervention(s): Limited activity within patient's tolerance;Monitored during session;Repositioned    Home Living                      Prior Function            PT Goals (current goals can now be found in  the care plan section) Progress towards PT goals: Progressing toward goals    Frequency    Min 2X/week      PT Plan Current plan remains appropriate    Co-evaluation              AM-PAC PT "6 Clicks" Mobility   Outcome Measure  Help needed turning from your back to your side while in a flat bed without using bedrails?: A Little Help needed moving from lying on your back to sitting on the side of a flat bed without using bedrails?: A Lot Help needed moving to and from a bed to a chair (including a wheelchair)?: A Lot Help needed standing up from a chair using your arms (e.g., wheelchair or bedside chair)?: A  Lot Help needed to walk in hospital room?: A Lot Help needed climbing 3-5 steps with a railing? : Total 6 Click Score: 12    End of Session Equipment Utilized During Treatment: Gait belt Activity Tolerance: Patient limited by fatigue Patient left: with chair alarm set;with call bell/phone within reach;with nursing/sitter in room Nurse Communication: Mobility status PT Visit Diagnosis: Other abnormalities of gait and mobility (R26.89);Pain;Muscle weakness (generalized) (M62.81) Pain - Right/Left: Right Pain - part of body: Leg     Time: [id number] PT Time Calculation (min) (ACUTE ONLY): 27 min  Charges:  $Therapeutic Activity: 23-37 mins                     Lieutenant Diego PT, DPT 11:00 AM,06/03/19

## 2019-06-03 NOTE — Progress Notes (Signed)
VAST consulted to obtain IV access. Called unit and spoke with pt's nurse who stated a colleague put in the consult. Pt's nurse stated she could start the pt's IV. VAST RN educated our services are available for difficult access and if she needs assistance to reach out. She verbalized understanding.

## 2019-06-03 NOTE — Progress Notes (Signed)
Patient ID: Justin Leach, male   DOB: October 30, 1931, 86 y.o.   MRN: [id number] Left message for carolyn adlrdige

## 2019-06-03 NOTE — Progress Notes (Addendum)
[facility] at [place] NAME: Justin Leach    MR#:  [id number]  DATE OF BIRTH:  September 05, 1931  SUBJECTIVE:   Patient very hard on hearing. Not much information obtained from pt Currently on  RA Sats are 93-96% REVIEW OF SYSTEMS:   Review of Systems  Constitutional: Negative for chills, fever and weight loss.  HENT: Positive for hearing loss. Negative for ear discharge, ear pain and nosebleeds.   Eyes: Negative for blurred vision, pain and discharge.  Respiratory: Positive for shortness of breath. Negative for sputum production, wheezing and stridor.   Cardiovascular: Negative for chest pain, palpitations, orthopnea and PND.  Gastrointestinal: Negative for abdominal pain, diarrhea, nausea and vomiting.  Genitourinary: Negative for frequency and urgency.  Musculoskeletal: Negative for back pain and joint pain.  Neurological: Negative for sensory change, speech change, focal weakness and weakness.  Psychiatric/Behavioral: Negative for depression and hallucinations. The patient is not nervous/anxious.    Tolerating Diet: yes Tolerating PT: rehab  DRUG ALLERGIES:   Allergies  Allergen Reactions  . 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 Patient subsequently taken off amiodarone.  . Nifedipine Other (See Comments)    Reaction: made gums swell up. Pt states that he had to have gum surgery after taking.     VITALS:  Blood pressure 96/64, pulse 69, temperature 97.8 F (36.6 C), temperature source Oral, resp. rate 19, height 5\' 11"  (1.803 m), weight 84.6 kg, SpO2 93 %.  PHYSICAL EXAMINATION:   Physical Exam  GENERAL:  86 y.o.-year-old patient lying in the bed with no acute distress.  EYES: Pupils equal, round, reactive to light and accommodation. No scleral icterus. Extraocular muscles intact.  HEENT: Head atraumatic, normocephalic.  Oropharynx and nasopharynx clear.  NECK:  Supple, no jugular venous distention. No thyroid enlargement, no tenderness.  LUNGS: decreased breath sounds bilaterally, no wheezing, rales, rhonchi. No use of accessory muscles of respiration.  CARDIOVASCULAR: S1, S2 normal. No murmurs, rubs, or gallops.  ABDOMEN: Soft, nontender, nondistended. Bowel sounds present. No organomegaly or mass.  EXTREMITIES: No cyanosis, clubbing or edema b/l.    NEUROLOGIC: limited exam. Moves all extremities well. PSYCHIATRIC:  patient is alert very hard on hearing. Likely has some baseline confusion SKIN: No obvious rash, lesion, or ulcer.   LABORATORY PANEL:  CBC Recent Labs  Lab 06/03/19 0405  WBC 8.3  HGB 10.9*  HCT 34.1*  PLT 205    Chemistries  Recent Labs  Lab 06/03/19 0405  NA 145  K 4.6  CL 106  CO2 31  GLUCOSE 72  BUN 63*  CREATININE 2.79*  CALCIUM 8.8*  AST 19  ALT 18  ALKPHOS 78  BILITOT 0.6   Cardiac Enzymes No results for input(s): TROPONINI in the last 168 hours. RADIOLOGY:  No results found. ASSESSMENT AND PLAN:  Justin Leach is a 86 y.o. male with medical history significant for  interstitial lung disease not on chronic oxygen, paroxysmal atrial fibrillation,hx of subdural hematoma,chronic diastolic heart failure, CKD stage III, type 2 diabetes, hypertension who presents with worsening confusion above his baseline  1. Acute hypoxic respiratory failure.  Patient's pulse ox was 86% on room air on presentation.   No respiratory distress. Sats are stable on room air 93 to 96%  2. COVID-19 pneumonia.  Continue remdesivir day 4  today.  Continue Decadron, zinc and vitamin C.  Follow inflammatory  markers.  Patient on empiric antibiotic-- changed to oral.  3. Acute delirium from not sleeping last night.  Trial of Seroquel at night. It seems to have helped.  4. Paroxysmal atrial fibrillation.  Patient not on any anticoagulation secondary to subdural hematoma. cont  metoprolol.  5. Chronic diastolic congestive heart failure.  No signs of heart failure.  Hold Lasix.  Continue Imdur and hydralazine.  Hold lisinopril for right now  6. Acute on chronic kidney disease stage III.  Monitor off Lasix and lisinopril for right now. -Creatinine 2.71. -Consider nephrology consultation if creatinine continues to rise. -Oral fluids--pt does not like to drink  7. Type 2 diabetes mellitus with chronic kidney disease.  Placed on sliding scale insulin.  Last hemoglobin A1c couple months ago was 7.2.  8. Essential hypertension.  Started meds back again hydralazine and metoprolol.  9. History of interstitial lung disease  Seen by physical therapy recommends rehab. Patient's wife wants to have patient return to [facility]. I have discussed with social worker to see if [facility] can take patient back else will have to consider rehab.   Procedures: none Family communication : with wife on the phone Consults : none Discharge Disposition : rehab versus [facility] CODE STATUS: DNR prior to admission DVT Prophylaxis : heparin  TOTAL TIME TAKING CARE OF THIS PATIENT: *25* minutes.  >50% time spent on counselling and coordination of care  Note: This dictation was prepared with Dragon dictation along with smaller phrase technology. Any transcriptional errors that result from this process are unintentional.  Fritzi Mandes M.D on 06/03/2019 at 2:02 PM  Between 7am to 6pm - Pager - [phone]  After 6pm go to [email or web address]  [facility]   CC: Primary care physician; Alvester Morin, MDPatient ID: Justin Leach, male   DOB: 1932-03-10, 86 y.o.   MRN: [id number]

## 2019-06-04 LAB — GLUCOSE, CAPILLARY
Glucose-Capillary: 103 mg/dL — ABNORMAL HIGH (ref 70–99)
Glucose-Capillary: 137 mg/dL — ABNORMAL HIGH (ref 70–99)
Glucose-Capillary: 204 mg/dL — ABNORMAL HIGH (ref 70–99)
Glucose-Capillary: 235 mg/dL — ABNORMAL HIGH (ref 70–99)
Glucose-Capillary: 83 mg/dL (ref 70–99)
Glucose-Capillary: 96 mg/dL (ref 70–99)

## 2019-06-04 LAB — CBC
HCT: 35.8 % — ABNORMAL LOW (ref 39.0–52.0)
Hemoglobin: 11.5 g/dL — ABNORMAL LOW (ref 13.0–17.0)
MCH: 30.5 pg (ref 26.0–34.0)
MCHC: 32.1 g/dL (ref 30.0–36.0)
MCV: 95 fL (ref 80.0–100.0)
Platelets: 219 10*3/uL (ref 150–400)
RBC: 3.77 MIL/uL — ABNORMAL LOW (ref 4.22–5.81)
RDW: 13.6 % (ref 11.5–15.5)
WBC: 8.7 10*3/uL (ref 4.0–10.5)
nRBC: 0 % (ref 0.0–0.2)

## 2019-06-04 LAB — FERRITIN: Ferritin: 157 ng/mL (ref 24–336)

## 2019-06-04 LAB — C-REACTIVE PROTEIN: CRP: 1 mg/dL — ABNORMAL HIGH (ref <1.0)

## 2019-06-04 LAB — BASIC METABOLIC PANEL
Anion gap: 8 (ref 5–15)
BUN: 58 mg/dL — ABNORMAL HIGH (ref 8–23)
CO2: 31 mmol/L (ref 22–32)
Calcium: 8.7 mg/dL — ABNORMAL LOW (ref 8.9–10.3)
Chloride: 103 mmol/L (ref 98–111)
Creatinine, Ser: 2.41 mg/dL — ABNORMAL HIGH (ref 0.61–1.24)
GFR calc Af Amer: 27 mL/min — ABNORMAL LOW (ref >60)
GFR calc non Af Amer: 24 mL/min — ABNORMAL LOW (ref >60)
Glucose, Bld: 74 mg/dL (ref 70–99)
Potassium: 4.6 mmol/L (ref 3.5–5.1)
Sodium: 142 mmol/L (ref 135–145)

## 2019-06-04 LAB — FIBRIN DERIVATIVES D-DIMER (ARMC ONLY): Fibrin derivatives D-dimer (ARMC): 2432.61 ng/mL (FEU) — ABNORMAL HIGH (ref 0.00–499.00)

## 2019-06-04 MED ORDER — ONDANSETRON HCL 4 MG/2ML IJ SOLN: 4.0000 mg | Freq: Four times a day (QID) | INTRAMUSCULAR | Status: DC | PRN

## 2019-06-04 MED ORDER — METOPROLOL TARTRATE 25 MG PO TABS
25.0000 mg | ORAL_TABLET | Freq: Two times a day (BID) | ORAL | Status: DC
  Administered 2019-06-04 – 2019-06-05 (×2): 25 mg via ORAL
  Filled 2019-06-04 (×2): qty 1

## 2019-06-04 MED ORDER — CEFDINIR 300 MG PO CAPS: 300.0000 mg | ORAL_CAPSULE | Freq: Two times a day (BID) | ORAL | 0 refills | Status: AC

## 2019-06-04 MED ORDER — INSULIN GLARGINE 100 UNIT/ML ~~LOC~~ SOLN: 8.0000 [IU] | Freq: Every day | SUBCUTANEOUS | Status: DC

## 2019-06-04 MED ORDER — GUAIFENESIN-DM 100-10 MG/5ML PO SYRP: 10.0000 mL | ORAL_SOLUTION | ORAL | 0 refills | Status: AC | PRN

## 2019-06-04 MED ORDER — DEXAMETHASONE 6 MG PO TABS: 6.0000 mg | ORAL_TABLET | Freq: Every day | ORAL | 0 refills | Status: AC

## 2019-06-04 MED ORDER — HYDRALAZINE HCL 25 MG PO TABS
25.0000 mg | ORAL_TABLET | Freq: Four times a day (QID) | ORAL | Status: DC | PRN
  Administered 2019-06-04 – 2019-06-05 (×2): 25 mg via ORAL
  Filled 2019-06-04 (×2): qty 1

## 2019-06-04 MED ORDER — AMLODIPINE BESYLATE 10 MG PO TABS
10.0000 mg | ORAL_TABLET | Freq: Every day | ORAL | Status: DC
  Administered 2019-06-04 – 2019-06-05 (×2): 10 mg via ORAL
  Filled 2019-06-04 (×2): qty 1

## 2019-06-04 MED ORDER — ALBUTEROL SULFATE HFA 108 (90 BASE) MCG/ACT IN AERS: 2.0000 | INHALATION_SPRAY | Freq: Four times a day (QID) | RESPIRATORY_TRACT | 0 refills | Status: AC

## 2019-06-04 MED ORDER — INSULIN GLARGINE 100 UNIT/ML ~~LOC~~ SOLN
8.0000 [IU] | Freq: Every day | SUBCUTANEOUS | Status: DC
  Administered 2019-06-04 – 2019-06-05 (×2): 8 [IU] via SUBCUTANEOUS
  Filled 2019-06-04 (×3): qty 0.08

## 2019-06-04 NOTE — Progress Notes (Signed)
Justin Leach NAME: Justin Leach    MR#:  [id number]  DATE OF BIRTH:  08/10/1931  SUBJECTIVE:   Patient very hard on hearing. Not much information obtained from pt Currently on  RA Sats are 93-96% REVIEW OF SYSTEMS:   Review of Systems  Constitutional: Negative for chills, fever and weight loss.  HENT: Positive for hearing loss. Negative for ear discharge, ear pain and nosebleeds.   Eyes: Negative for blurred vision, pain and discharge.  Respiratory: Positive for shortness of breath. Negative for sputum production, wheezing and stridor.   Cardiovascular: Negative for chest pain, palpitations, orthopnea and PND.  Gastrointestinal: Negative for abdominal pain, diarrhea, nausea and vomiting.  Genitourinary: Negative for frequency and urgency.  Musculoskeletal: Negative for back pain and joint pain.  Neurological: Negative for sensory change, speech change, focal weakness and weakness.  Psychiatric/Behavioral: Negative for depression and hallucinations. The patient is not nervous/anxious.    Tolerating Diet: yes Tolerating PT: rehab  DRUG ALLERGIES:   Allergies  Allergen Reactions  . 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 Patient subsequently taken off amiodarone.  . Nifedipine Other (See Comments)    Reaction: made gums swell up. Pt states that he had to have gum surgery after taking.     VITALS:  Blood pressure (!) 159/60, pulse 65, temperature 97.9 F (36.6 C), temperature source Oral, resp. rate 18, height 5\' 11"  (1.803 m), weight 84.6 kg, SpO2 93 %.  PHYSICAL EXAMINATION:   Physical Exam  GENERAL:  86 y.o.-year-old patient lying in the bed with no acute distress.  EYES: Pupils equal, round, reactive to light and accommodation. No scleral icterus. Extraocular muscles intact.  HEENT: Head atraumatic, normocephalic.  Oropharynx and nasopharynx clear.  NECK:  Supple, no jugular venous distention. No thyroid enlargement, no tenderness.  LUNGS: decreased breath sounds bilaterally, no wheezing, rales, rhonchi. No use of accessory muscles of respiration.  CARDIOVASCULAR: S1, S2 normal. No murmurs, rubs, or gallops.  ABDOMEN: Soft, nontender, nondistended. Bowel sounds present. No organomegaly or mass.  EXTREMITIES: No cyanosis, clubbing or edema b/l.    NEUROLOGIC: limited exam. Moves all extremities well. PSYCHIATRIC:  patient is alert very hard on hearing. Likely has some baseline confusion SKIN: No obvious rash, lesion, or ulcer.   LABORATORY PANEL:  CBC Recent Labs  Lab 06/04/19 0458  WBC 8.7  HGB 11.5*  HCT 35.8*  PLT 219    Chemistries  Recent Labs  Lab 06/03/19 0405 06/03/19 0405 06/04/19 0405  NA 145   < > 142  K 4.6   < > 4.6  CL 106   < > 103  CO2 31   < > 31  GLUCOSE 72   < > 74  BUN 63*   < > 58*  CREATININE 2.79*   < > 2.41*  CALCIUM 8.8*   < > 8.7*  AST 19  --   --   ALT 18  --   --   ALKPHOS 78  --   --   BILITOT 0.6  --   --    < > = values in this interval not displayed.   Cardiac Enzymes No results for input(s): TROPONINI in the last 168 hours. RADIOLOGY:  No results found. ASSESSMENT AND PLAN:  Justin Leach is a 86 y.o. male with medical history significant for  interstitial lung disease  not on chronic oxygen, paroxysmal atrial fibrillation,hx of subdural hematoma,chronic diastolic heart failure, CKD stage III, type 2 diabetes, hypertension who presents with worsening confusion above his baseline  1. Acute hypoxic respiratory failure.  Patient's pulse ox was 86% on room air on presentation.   No respiratory distress. Sats are stable on room air 93 to 96%  2. COVID-19 pneumonia.  Continue remdesivir day 4  today.  Continue Decadron, zinc and vitamin C.  Follow inflammatory markers.  Patient on empiric antibiotic-- changed to oral.  3. Paroxysmal atrial  fibrillation.  Patient not on any anticoagulation secondary to subdural hematoma. cont metoprolol.  4. Chronic diastolic congestive heart failure.  No signs of heart failure.  Hold Lasix.  Continue Imdur and hydralazine.  Hold lisinopril for right now  5. Acute on chronic kidney disease stage III.  Monitor off Lasix and lisinopril for right now. -Creatinine 2.71--2.79--BMP today -Consider nephrology consultation if creatinine continues to rise. -Oral fluids--pt does not like to drink  6. Type 2 diabetes mellitus with chronic kidney disease.  Placed on sliding scale insulin.  Last hemoglobin A1c couple months ago was 7.2.  7. Essential hypertension.  Started meds back again hydralazine and metoprolol.  8. History of interstitial lung disease  Seen by physical therapy recommends rehab.  CSW working on rehab beds.   Procedures: none Family communication : with wife on the phone Consults : none Discharge Disposition : rehab CODE STATUS: DNR prior to admission DVT Prophylaxis : heparin  TOTAL TIME TAKING CARE OF THIS PATIENT: *25* minutes.  >50% time spent on counselling and coordination of care  Note: This dictation was prepared with Dragon dictation along with smaller phrase technology. Any transcriptional errors that result from this process are unintentional.  Fritzi Mandes M.D on 06/04/2019 at 12:46 PM  Between 7am to 6pm - Pager - [phone]  After 6pm go to [email or web address]  [facility]   CC: Primary care physician; Alvester Morin, MDPatient ID: Justin Leach, male   DOB: Oct 05, 1931, 86 y.o.   MRN: [id number]

## 2019-06-04 NOTE — Care Management Important Message (Signed)
Important Message  Patient Details  Name: Justin Leach MRN: [id number] Date of Birth: February 12, 1932   Medicare Important Message Given:  Yes  Reviewed verbally over phone with Charma Igo at [phone].  Declined copy of Medicare IM as they already received copy of information in mail.     [name] 06/04/2019, 12:16 PM

## 2019-06-04 NOTE — TOC Progression Note (Signed)
STransition of Care [facility]) - Progression Note    Patient Details  Name: Justin Leach MRN: [id number] Date of Birth: [birth year]  Transition of Care [facility]) CM/SW Contact  Eileen Stanford, LCSW Phone Number: 06/04/2019, 12:53 PM  Clinical Narrative:  Pt will transfer to [facility] in [place] tomorrow. MD aware.      Expected Discharge Plan: Skilled Nursing Facility Barriers to Discharge: Continued Medical Work up  Expected Discharge Plan and Services Expected Discharge Plan: [place] In-house Referral: NA Discharge Planning Services: NA Post Acute Care Choice: [place] Living arrangements for the past 2 months: Assisted Living Facility([facility])                                       Social Determinants of Health (SDOH) Interventions    Readmission Risk Interventions Readmission Risk Prevention Plan 05/03/2019  Transportation Screening Complete  HRI or [place] Not Complete  HRI or Home Care Consult comments Pt going to SNF rehab  Social Work Consult for [place] Planning/Counseling [place] Not Applicable  Medication Review [employer] photographer) Complete  Some recent data might be hidden

## 2019-06-04 NOTE — Progress Notes (Signed)
Notify Dr. Posey Pronto about patient's SBP at 184/59, order started for Norvasc and Hydralazine PRN. RN will continue to monitor.

## 2019-06-05 DIAGNOSIS — E785 Hyperlipidemia, unspecified: Secondary | ICD-10-CM | POA: Diagnosis not present

## 2019-06-05 DIAGNOSIS — I10 Essential (primary) hypertension: Secondary | ICD-10-CM | POA: Diagnosis not present

## 2019-06-05 DIAGNOSIS — I5032 Chronic diastolic (congestive) heart failure: Secondary | ICD-10-CM

## 2019-06-05 DIAGNOSIS — U071 COVID-19: Secondary | ICD-10-CM | POA: Diagnosis not present

## 2019-06-05 DIAGNOSIS — E1165 Type 2 diabetes mellitus with hyperglycemia: Secondary | ICD-10-CM | POA: Diagnosis not present

## 2019-06-05 DIAGNOSIS — N183 Chronic kidney disease, stage 3 unspecified: Secondary | ICD-10-CM | POA: Diagnosis not present

## 2019-06-05 DIAGNOSIS — I5030 Unspecified diastolic (congestive) heart failure: Secondary | ICD-10-CM | POA: Diagnosis not present

## 2019-06-05 DIAGNOSIS — R4182 Altered mental status, unspecified: Secondary | ICD-10-CM | POA: Diagnosis not present

## 2019-06-05 DIAGNOSIS — J849 Interstitial pulmonary disease, unspecified: Secondary | ICD-10-CM | POA: Diagnosis not present

## 2019-06-05 DIAGNOSIS — F331 Major depressive disorder, recurrent, moderate: Secondary | ICD-10-CM | POA: Diagnosis not present

## 2019-06-05 DIAGNOSIS — I480 Paroxysmal atrial fibrillation: Secondary | ICD-10-CM | POA: Diagnosis not present

## 2019-06-05 DIAGNOSIS — G9340 Encephalopathy, unspecified: Secondary | ICD-10-CM | POA: Diagnosis not present

## 2019-06-05 DIAGNOSIS — N179 Acute kidney failure, unspecified: Secondary | ICD-10-CM | POA: Diagnosis not present

## 2019-06-05 DIAGNOSIS — M6281 Muscle weakness (generalized): Secondary | ICD-10-CM | POA: Diagnosis not present

## 2019-06-05 DIAGNOSIS — D649 Anemia, unspecified: Secondary | ICD-10-CM | POA: Diagnosis not present

## 2019-06-05 DIAGNOSIS — E119 Type 2 diabetes mellitus without complications: Secondary | ICD-10-CM | POA: Diagnosis not present

## 2019-06-05 DIAGNOSIS — R6889 Other general symptoms and signs: Secondary | ICD-10-CM | POA: Diagnosis not present

## 2019-06-05 DIAGNOSIS — Z7401 Bed confinement status: Secondary | ICD-10-CM | POA: Diagnosis not present

## 2019-06-05 DIAGNOSIS — J9601 Acute respiratory failure with hypoxia: Secondary | ICD-10-CM | POA: Diagnosis not present

## 2019-06-05 DIAGNOSIS — M2550 Pain in unspecified joint: Secondary | ICD-10-CM | POA: Diagnosis not present

## 2019-06-05 LAB — CULTURE, BLOOD (ROUTINE X 2)
Culture: NO GROWTH
Culture: NO GROWTH
Special Requests: ADEQUATE

## 2019-06-05 LAB — GLUCOSE, CAPILLARY
Glucose-Capillary: 198 mg/dL — ABNORMAL HIGH (ref 70–99)
Glucose-Capillary: 264 mg/dL — ABNORMAL HIGH (ref 70–99)

## 2019-06-05 MED ORDER — AMLODIPINE BESYLATE 10 MG PO TABS: 10.0000 mg | ORAL_TABLET | Freq: Every day | ORAL | 0 refills | Status: AC

## 2019-06-05 NOTE — TOC Transition Note (Signed)
Transition of Care [facility]) - CM/SW Discharge Note   Patient Details  Name: Justin Leach MRN: [id number] Date of Birth: 1932/02/24  Transition [facility]) CM/SW Contact:  Ross Ludwig, LCSW Phone Number: 06/05/2019, 12:19 PM   Clinical Narrative:     Patient to be d/c'ed today to [facility] in [place] room 2042.  Patient and family agreeable to plans will transport via ems RN to call report to [phone].  CSW notified patient's wife, sister in law, and [place].  They are aware that patient is discharging today.  CSW spoke to [facility] in [place] and they have received the discharge summary.   Final next level of care: Skilled Nursing Facility Barriers to Discharge: Barriers Resolved   Patient Goals and CMS Choice Patient states their goals for this hospitalization and ongoing recovery are:: To go to SNF for short term rehab, then return back home. CMS Medicare.gov Compare Post Acute Care list provided to:: Patient Represenative (must comment) Choice offered to / list presented to : [facility] POA / Guardian, Sibling  Discharge Placement PASRR number recieved: 06/01/19            Patient chooses bed at: [facility] Patient to be transferred to facility by: [facility] EMS Name of family member notified: Vaughan Basta patient's wife, Elmo Putt, and Hoyle Sauer sister in Sports coach. Patient and family notified of of transfer: 06/05/19  Discharge Plan and Services In-house Referral: NA Discharge Planning Services: NA Post Acute Care Choice: [place]          DME Arranged: N/A DME Agency: NA                  Social Determinants of Health (SDOH) Interventions     Readmission Risk Interventions Readmission Risk Prevention Plan 05/03/2019  Transportation Screening Complete  HRI or [place] Not Complete  HRI or Home Care Consult comments Pt going to SNF rehab  Social Work Consult for [place] Planning/Counseling [place] Not Applicable  Medication Review Press photographer) Complete  Some recent data might be hidden

## 2019-06-05 NOTE — Plan of Care (Signed)

## 2019-06-05 NOTE — Progress Notes (Signed)
EMS is here to pick up patient. Previous RN Danae Chen called report to [facility]. Discontinue PIV and telemetry monitor. Belongings sent to EMS.

## 2019-06-05 NOTE — Discharge Summary (Signed)
[place] [facility] [place] NAME: Justin Leach    MR#:  [id number]  DATE OF BIRTH:  06-22-1931  DATE OF ADMISSION:  05/30/2019 ADMITTING PHYSICIAN: Athena Masse, MD  DATE OF DISCHARGE: 06/05/2019  PRIMARY CARE PHYSICIAN: Alvester Morin, MD    ADMISSION DIAGNOSIS:  Hypoxia [R09.02] Acute respiratory failure with hypoxia (HCC) [J96.01] Altered mental status, unspecified altered mental status type [R41.82] COVID-19 [U07.1]  DISCHARGE DIAGNOSIS:  acute respiratory failure secondary to COVID pneumonia paroxysmal atrial fibrillation acute on chronic kidney disease stage III-- suspected prerenal SECONDARY DIAGNOSIS:   Past Medical History:  Diagnosis Date  . Bradycardia    a. during 02/2015 admission - occasional HR in 40s while being treated for atrial fib.  . Cervical disc disease   . Chronic diastolic CHF (congestive heart failure) ([place])    a. Dx 02/2015 -  2D echo 03/02/15 showed severe focal basal hypertrophy of the septum, EF 55-60%, mild MR, no effusion.  . CKD (chronic kidney disease), stage III   . Coronary artery calcification seen on CT scan    a. Nuc 02/2015 was normal.  . Dyslipidemia   . GERD (gastroesophageal reflux disease)   . Hypertension   . 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 PAF (paroxysmal atrial fibrillation) ([place])    a. Dx 02/2015 - in and out on tele, rx'd Coumadin and amiodarone.  . Pneumonia 2007  . Shortness of breath dyspnea    WITH EXERTION  . Type II diabetes mellitus [facility])     HOSPITAL COURSE:   Justin Leach a 86 y.o.malewith medical history significant forinterstitial lung disease not on chronic oxygen, paroxysmal atrial fibrillation,hx of subdural hematoma,chronic diastolic heart failure, CKD stage III, type 2 diabetes, hypertension who presents withworsening confusion above his baseline  1. Acute hypoxic respiratory failure.  Patient's pulse ox was 86% on room air onpresentation.  No respiratory distress. Sats are stable on room air 93 to 96%  2. COVID-19 pneumonia. completed Remdesivir. Received steroids , zinc and vitamin C. Completed empiric antibiotic.  3. Paroxysmal atrial fibrillation. Patient not on any anticoagulation secondary to subdural hematoma. cont metoprolol.  4. Chronic diastolic congestive heart failure. No signs of heart failure. Hold Lasix due to creat and pt not drinking much fluids. -Continue Imdur and hydralazine. Hold lisinopril for right now due to creat  5. Acute on chronic kidney disease stage III. Monitor off Lasix and lisinopril for right now. -Creatinine 2.71--2.79--2.41 -Oral fluids--pt does not like to drink  6. Type 2 diabetes mellitus with chronic kidney disease. Placed on sliding scale insulin. Last hemoglobin A1c couple months ago was 7.2. pt po intake is variable. Hold po meds, hold insulin  7. Essential hypertension.  hydralazine and metoprolol. Added Amlodipine. So far tolerating po meds.   8. History of interstitial lung disease  Seen by physical therapy recommends rehab.  patient will discharged to [place] [facility] today.  Spoke with pt's wife Programmer, systems CONSULTS OBTAINED:    DRUG ALLERGIES:   Allergies  Allergen Reactions  . 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 Patient subsequently taken off amiodarone.  . Nifedipine Other (See Comments)    Reaction: made gums swell up. Pt states that he had to have gum surgery after taking.     DISCHARGE MEDICATIONS:   Allergies as of 06/05/2019  Reactions   Amiodarone Other (See Comments)   MD noted 10/31/15: Chest imaging in January showed new findings concerning for amiodarone toxicity versus NSIP. Patient subsequently taken off amiodarone.   Nifedipine Other (See Comments)   Reaction:  made gums swell up. Pt states that he had to have gum surgery after taking.       Medication List    STOP taking these medications   furosemide 20 MG tablet Commonly known as: LASIX   Lantus SoloStar 100 UNIT/ML Solostar Pen Generic drug: Insulin Glargine   lisinopril 5 MG tablet Commonly known as: ZESTRIL     TAKE these medications   acetaminophen 650 MG CR tablet Commonly known as: TYLENOL Take 650 mg by mouth every 6 (six) hours as needed for pain.   albuterol 108 (90 Base) MCG/ACT inhaler Commonly known as: VENTOLIN HFA Inhale 2 puffs into the lungs every 6 (six) hours.   amLODipine 10 MG tablet Commonly known as: NORVASC Take 1 tablet (10 mg total) by mouth daily.   aspirin 81 MG chewable tablet Chew 1 tablet (81 mg total) by mouth daily.   atorvastatin 40 MG tablet Commonly known as: LIPITOR Take 40 mg by mouth at bedtime.   bisacodyl 10 MG suppository Commonly known as: DULCOLAX Place 1 suppository (10 mg total) rectally daily as needed for moderate constipation.   cefdinir 300 MG capsule Commonly known as: OMNICEF Take 1 capsule (300 mg total) by mouth every 12 (twelve) hours for 2 days.   citalopram 20 MG tablet Commonly known as: CELEXA Take 1 tablet (20 mg total) by mouth daily.   Dermacloud Crea Apply 1 application topically 3 (three) times daily. (apply to perirectal area)   dexamethasone 6 MG tablet Commonly known as: DECADRON Take 1 tablet (6 mg total) by mouth daily.   febuxostat 40 MG tablet Commonly known as: ULORIC Take 40 mg by mouth daily.   guaiFENesin-dextromethorphan 100-10 MG/5ML syrup Commonly known as: ROBITUSSIN DM Take 10 mLs by mouth every 4 (four) hours as needed for cough.   hydrALAZINE 100 MG tablet Commonly known as: APRESOLINE Take 1 tablet (100 mg total) by mouth 3 (three) times daily.   insulin aspart 100 UNIT/ML injection Commonly known as: novoLOG Inject 8 Units into the skin 4 (four) times daily - after meals  and at bedtime. Sliding Scale:  200-249: 2u 250-299: 4u 300-349: 6u 350-399: 8u 400-449: 10u 450-499: 12u 500+ Contact physican   isosorbide mononitrate 60 MG 24 hr tablet Commonly known as: IMDUR Take 1 tablet (60 mg total) by mouth daily.   loperamide 2 MG capsule Commonly known as: IMODIUM Take 2-4 mg by mouth See admin instructions. Take 2 capsules (4mg ) by mouth at onset of symptoms and take 1 capsule (2mg ) by mouth every 8 hours if needed - max 4 capsules in 24 hours   loratadine 10 MG tablet Commonly known as: CLARITIN Take 10 mg by mouth daily.   metoprolol tartrate 50 MG tablet Commonly known as: LOPRESSOR Take 1 tablet (50 mg total) by mouth 2 (two) times daily.   multivitamin with minerals Tabs tablet Take 1 tablet by mouth daily.   nitroGLYCERIN 0.4 MG SL tablet Commonly known as: NITROSTAT Place 0.4 mg under the tongue every 5 (five) minutes as needed for chest pain (max 3 doses).   omeprazole 20 MG capsule Commonly known as: PRILOSEC Take 20 mg by mouth daily.   ondansetron 4 MG tablet Commonly known as: ZOFRAN Take 4 mg by mouth every  4 (four) hours as needed for nausea or vomiting.   polyethylene glycol 17 g packet Commonly known as: MIRALAX / GLYCOLAX Take 17 g by mouth daily as needed for mild constipation.       If you experience worsening of your admission symptoms, develop shortness of breath, life threatening emergency, suicidal or homicidal thoughts you must seek medical attention immediately by calling 911 or calling your MD immediately  if symptoms less severe.  You Must read complete instructions/literature along with all the possible adverse reactions/side effects for all the Medicines you take and that have been prescribed to you. Take any new Medicines after you have completely understood and accept all the possible adverse reactions/side effects.   Please note  You were cared for by a hospitalist during your hospital stay. If you have  any questions about your discharge medications or the care you received while you were in the hospital after you are discharged, you can call the unit and asked to speak with the hospitalist on call if the hospitalist that took care of you is not available. Once you are discharged, your primary care physician will handle any further medical issues. Please note that NO REFILLS for any discharge medications will be authorized once you are discharged, as it is imperative that you return to your primary care physician (or establish a relationship with a primary care physician if you do not have one) for your aftercare needs so that they can reassess your need for medications and monitor your lab values. Today   SUBJECTIVE   Very hard on hearing.  VITAL SIGNS:  Blood pressure (!) 152/66, pulse 70, temperature (!) 97.5 F (36.4 C), temperature source Oral, resp. rate 18, height 5\' 11"  (1.803 m), weight 84.6 kg, SpO2 95 %.  I/O:    Intake/Output Summary (Last 24 hours) at 06/05/2019 0840 Last data filed at 06/05/2019 0513 Gross per 24 hour  Intake --  Output 850 ml  Net -850 ml    PHYSICAL EXAMINATION:  GENERAL:  86 y.o.-year-old patient lying in the bed with no acute distress.  LUNGS: Normal breath sounds bilaterally, no wheezing, rales,rhonchi or crepitation. No use of accessory muscles of respiration.  CARDIOVASCULAR: S1, S2 normal. No murmurs, rubs, or gallops.  ABDOMEN: Soft, non-tender, non-distended. Bowel sounds present. No organomegaly or mass.  EXTREMITIES: No pedal edema, cyanosis, or clubbing.  NEUROLOGIC: Cranial nerves II through XII are intact. Muscle strength 5/5 in all extremities. Sensation intact. Gait not checked. Focal deficit PSYCHIATRIC: The patient is alert and hard on hearing  SKIN: No obvious rash, lesion, or ulcer.   DATA REVIEW:   CBC  Recent Labs  Lab 06/04/19 0458  WBC 8.7  HGB 11.5*  HCT 35.8*  PLT 219    Chemistries  Recent Labs  Lab 06/03/19 0405  06/03/19 0405 06/04/19 0405  NA 145   < > 142  K 4.6   < > 4.6  CL 106   < > 103  CO2 31   < > 31  GLUCOSE 72   < > 74  BUN 63*   < > 58*  CREATININE 2.79*   < > 2.41*  CALCIUM 8.8*   < > 8.7*  AST 19  --   --   ALT 18  --   --   ALKPHOS 78  --   --   BILITOT 0.6  --   --    < > = values in this interval not displayed.  Microbiology Results   Recent Results (from the past 240 hour(s))  Culture, blood (routine x 2)     Status: None   Collection Time: 05/30/19 11:15 PM   Specimen: Right Antecubital; Blood  Result Value Ref Range Status   Specimen Description RIGHT ANTECUBITAL  Final   Special Requests Blood Culture adequate volume  Final   Culture   Final    NO GROWTH 5 DAYS Performed at [facility], [address] [place], [place] [address]    Report Status 06/05/2019 FINAL  Final  Culture, blood (routine x 2)     Status: None   Collection Time: 05/30/19 11:15 PM   Specimen: BLOOD RIGHT WRIST  Result Value Ref Range Status   Specimen Description BLOOD RIGHT WRIST  Final   Special Requests   Final    Blood Culture results may not be optimal due to an excessive volume of blood received in culture bottles   Culture   Final    NO GROWTH 5 DAYS Performed at [facility], [address] [place], [place] [address]    Report Status 06/05/2019 FINAL  Final  Respiratory Panel by RT PCR (Flu A&B, Covid) - Nasopharyngeal Swab     Status: Abnormal   Collection Time: 05/31/19 12:32 AM   Specimen: Nasopharyngeal Swab  Result Value Ref Range Status   SARS Coronavirus 2 by RT PCR POSITIVE (A) NEGATIVE Final    Comment: RESULT CALLED TO, READ BACK BY AND VERIFIED WITH: H OARE 05/31/19 AT 0300 HS    Influenza A by PCR NEGATIVE NEGATIVE Final   Influenza B by PCR NEGATIVE NEGATIVE Final    Comment: (NOTE) The Xpert Xpress SARS-CoV-2/FLU/RSV assay is intended as an aid in  the diagnosis of influenza from Nasopharyngeal swab specimens and  should not be used  as a sole basis for treatment. Nasal washings and  aspirates are unacceptable for Xpert Xpress SARS-CoV-2/FLU/RSV  testing. Fact Sheet for Patients: PinkCheek.be Fact Sheet for Healthcare Providers: GravelBags.it This test is not yet approved or cleared by the [place] FDA and  has been authorized for detection and/or diagnosis of SARS-CoV-2 by  FDA under an Emergency Use Authorization (EUA). This EUA will remain  in effect (meaning this test can be used) for the duration of the  Covid-19 declaration under Section 564(b)(1) of the Act, 21  U.S.C. section 360bbb-3(b)(1), unless the authorization is  terminated or revoked. Performed at [facility], [address] [place], [place] [address]     RADIOLOGY:  No results found.   CODE STATUS:     Code Status Orders  (From admission, onward)         Start     Ordered   05/31/19 0111  Do not attempt resuscitation (DNR)  Continuous    Question Answer Comment  In the event of cardiac or respiratory ARREST Do not call a "code blue"   In the event of cardiac or respiratory ARREST Do not perform Intubation, CPR, defibrillation or ACLS   In the event of cardiac or respiratory ARREST Use medication by any route, position, wound care, and other measures to relive pain and suffering. May use oxygen, suction and manual treatment of airway obstruction as needed for comfort.   Comments per wife linda Mccormac      05/31/19 0113        Code Status History    Date Active Date Inactive Code Status Order ID Comments User Context   05/01/2019 [id number]  05/09/2019 0006 DNR [id number]  Orene Desanctis, DO ED   07/20/2018 1840 07/24/2018 0013 Full Code [id number]  Bettey Costa, MD ED   05/13/2018 2224 05/27/2018 1831 Full Code [id number]  Ivor Costa, MD ED   10/30/2015 2351 11/05/2015 2307 Full Code [id number]  Toy Baker, MD ED   03/01/2015 0008 03/07/2015 2240 Full Code [id number]   Rise Patience, MD ED   05/11/2013 1354 05/17/2013 1943 Full Code [id number]  Rama, Venetia Maxon, MD Inpatient   Advance Care Planning Activity    Advance Directive Documentation     Most Recent Value  Type of Advance Directive  Healthcare Power of Attorney, Living will, Out of facility DNR (pink MOST or yellow form)  Pre-existing out of facility DNR order (yellow form or pink MOST form)  --  "MOST" Form in Place?  --       TOTAL TIME TAKING CARE OF THIS PATIENT: *40* minutes.    Fritzi Mandes M.D on 06/05/2019 at 8:40 AM  Between 7am to 6pm - Pager - [phone] After 6pm go to [email or web address] - password [email or web address]  [facility]    CC: Primary care physician; Alvester Morin, MD

## 2019-06-07 DIAGNOSIS — I10 Essential (primary) hypertension: Secondary | ICD-10-CM | POA: Diagnosis not present

## 2019-06-07 DIAGNOSIS — G9340 Encephalopathy, unspecified: Secondary | ICD-10-CM | POA: Diagnosis not present

## 2019-06-07 DIAGNOSIS — J849 Interstitial pulmonary disease, unspecified: Secondary | ICD-10-CM | POA: Diagnosis not present

## 2019-06-07 DIAGNOSIS — U071 COVID-19: Secondary | ICD-10-CM | POA: Diagnosis not present

## 2019-06-23 ENCOUNTER — Telehealth: Payer: Self-pay | Admitting: Adult Health Nurse Practitioner

## 2019-06-23 NOTE — Telephone Encounter (Signed)
Called facility to follow up on patient.  Main reception transferred me to nurses station and no one picked up phone.  Not able to leave message.  Will attempt to call back later. Kunal Levario K. Olena Heckle NP

## 2019-06-29 DIAGNOSIS — U071 COVID-19: Secondary | ICD-10-CM | POA: Diagnosis not present

## 2019-06-29 DIAGNOSIS — I480 Paroxysmal atrial fibrillation: Secondary | ICD-10-CM | POA: Diagnosis not present

## 2019-06-29 DIAGNOSIS — G9340 Encephalopathy, unspecified: Secondary | ICD-10-CM | POA: Diagnosis not present

## 2019-06-29 DIAGNOSIS — N1831 Chronic kidney disease, stage 3a: Secondary | ICD-10-CM | POA: Diagnosis not present

## 2019-07-01 DIAGNOSIS — E1129 Type 2 diabetes mellitus with other diabetic kidney complication: Secondary | ICD-10-CM | POA: Diagnosis not present

## 2019-07-01 DIAGNOSIS — R638 Other symptoms and signs concerning food and fluid intake: Secondary | ICD-10-CM | POA: Diagnosis not present

## 2019-07-01 DIAGNOSIS — R1110 Vomiting, unspecified: Secondary | ICD-10-CM | POA: Diagnosis not present

## 2019-07-01 DIAGNOSIS — R4182 Altered mental status, unspecified: Secondary | ICD-10-CM | POA: Diagnosis not present

## 2019-07-02 DIAGNOSIS — R109 Unspecified abdominal pain: Secondary | ICD-10-CM | POA: Diagnosis not present

## 2019-07-02 DIAGNOSIS — R1110 Vomiting, unspecified: Secondary | ICD-10-CM | POA: Diagnosis not present

## 2019-07-03 DIAGNOSIS — I130 Hypertensive heart and chronic kidney disease with heart failure and stage 1 through stage 4 chronic kidney disease, or unspecified chronic kidney disease: Secondary | ICD-10-CM | POA: Diagnosis not present

## 2019-07-03 DIAGNOSIS — E1165 Type 2 diabetes mellitus with hyperglycemia: Secondary | ICD-10-CM | POA: Diagnosis not present

## 2019-07-03 DIAGNOSIS — E785 Hyperlipidemia, unspecified: Secondary | ICD-10-CM | POA: Diagnosis not present

## 2019-07-03 DIAGNOSIS — J849 Interstitial pulmonary disease, unspecified: Secondary | ICD-10-CM | POA: Diagnosis not present

## 2019-07-03 DIAGNOSIS — Z7982 Long term (current) use of aspirin: Secondary | ICD-10-CM | POA: Diagnosis not present

## 2019-07-03 DIAGNOSIS — Z794 Long term (current) use of insulin: Secondary | ICD-10-CM | POA: Diagnosis not present

## 2019-07-03 DIAGNOSIS — K219 Gastro-esophageal reflux disease without esophagitis: Secondary | ICD-10-CM | POA: Diagnosis not present

## 2019-07-03 DIAGNOSIS — I5032 Chronic diastolic (congestive) heart failure: Secondary | ICD-10-CM | POA: Diagnosis not present

## 2019-07-03 DIAGNOSIS — I480 Paroxysmal atrial fibrillation: Secondary | ICD-10-CM | POA: Diagnosis not present

## 2019-07-03 DIAGNOSIS — F329 Major depressive disorder, single episode, unspecified: Secondary | ICD-10-CM | POA: Diagnosis not present

## 2019-07-03 DIAGNOSIS — E1122 Type 2 diabetes mellitus with diabetic chronic kidney disease: Secondary | ICD-10-CM | POA: Diagnosis not present

## 2019-07-03 DIAGNOSIS — N183 Chronic kidney disease, stage 3 unspecified: Secondary | ICD-10-CM | POA: Diagnosis not present

## 2019-07-06 DIAGNOSIS — F329 Major depressive disorder, single episode, unspecified: Secondary | ICD-10-CM | POA: Diagnosis not present

## 2019-07-06 DIAGNOSIS — E1165 Type 2 diabetes mellitus with hyperglycemia: Secondary | ICD-10-CM | POA: Diagnosis not present

## 2019-07-06 DIAGNOSIS — Z794 Long term (current) use of insulin: Secondary | ICD-10-CM | POA: Diagnosis not present

## 2019-07-06 DIAGNOSIS — J849 Interstitial pulmonary disease, unspecified: Secondary | ICD-10-CM | POA: Diagnosis not present

## 2019-07-06 DIAGNOSIS — I509 Heart failure, unspecified: Secondary | ICD-10-CM | POA: Diagnosis not present

## 2019-07-06 DIAGNOSIS — K219 Gastro-esophageal reflux disease without esophagitis: Secondary | ICD-10-CM | POA: Diagnosis not present

## 2019-07-06 DIAGNOSIS — Z7982 Long term (current) use of aspirin: Secondary | ICD-10-CM | POA: Diagnosis not present

## 2019-07-06 DIAGNOSIS — I130 Hypertensive heart and chronic kidney disease with heart failure and stage 1 through stage 4 chronic kidney disease, or unspecified chronic kidney disease: Secondary | ICD-10-CM | POA: Diagnosis not present

## 2019-07-06 DIAGNOSIS — N183 Chronic kidney disease, stage 3 unspecified: Secondary | ICD-10-CM | POA: Diagnosis not present

## 2019-07-06 DIAGNOSIS — R451 Restlessness and agitation: Secondary | ICD-10-CM | POA: Diagnosis not present

## 2019-07-06 DIAGNOSIS — E1122 Type 2 diabetes mellitus with diabetic chronic kidney disease: Secondary | ICD-10-CM | POA: Diagnosis not present

## 2019-07-06 DIAGNOSIS — I480 Paroxysmal atrial fibrillation: Secondary | ICD-10-CM | POA: Diagnosis not present

## 2019-07-06 DIAGNOSIS — I5032 Chronic diastolic (congestive) heart failure: Secondary | ICD-10-CM | POA: Diagnosis not present

## 2019-07-06 DIAGNOSIS — E785 Hyperlipidemia, unspecified: Secondary | ICD-10-CM | POA: Diagnosis not present

## 2019-07-06 DIAGNOSIS — J8410 Pulmonary fibrosis, unspecified: Secondary | ICD-10-CM | POA: Diagnosis not present

## 2019-07-06 DIAGNOSIS — R634 Abnormal weight loss: Secondary | ICD-10-CM | POA: Diagnosis not present

## 2019-07-08 DIAGNOSIS — J849 Interstitial pulmonary disease, unspecified: Secondary | ICD-10-CM | POA: Diagnosis not present

## 2019-07-08 DIAGNOSIS — Z794 Long term (current) use of insulin: Secondary | ICD-10-CM | POA: Diagnosis not present

## 2019-07-08 DIAGNOSIS — E1122 Type 2 diabetes mellitus with diabetic chronic kidney disease: Secondary | ICD-10-CM | POA: Diagnosis not present

## 2019-07-08 DIAGNOSIS — I480 Paroxysmal atrial fibrillation: Secondary | ICD-10-CM | POA: Diagnosis not present

## 2019-07-08 DIAGNOSIS — I130 Hypertensive heart and chronic kidney disease with heart failure and stage 1 through stage 4 chronic kidney disease, or unspecified chronic kidney disease: Secondary | ICD-10-CM | POA: Diagnosis not present

## 2019-07-08 DIAGNOSIS — N183 Chronic kidney disease, stage 3 unspecified: Secondary | ICD-10-CM | POA: Diagnosis not present

## 2019-07-08 DIAGNOSIS — I5032 Chronic diastolic (congestive) heart failure: Secondary | ICD-10-CM | POA: Diagnosis not present

## 2019-07-08 DIAGNOSIS — E1165 Type 2 diabetes mellitus with hyperglycemia: Secondary | ICD-10-CM | POA: Diagnosis not present

## 2019-07-08 DIAGNOSIS — F329 Major depressive disorder, single episode, unspecified: Secondary | ICD-10-CM | POA: Diagnosis not present

## 2019-07-08 DIAGNOSIS — K219 Gastro-esophageal reflux disease without esophagitis: Secondary | ICD-10-CM | POA: Diagnosis not present

## 2019-07-08 DIAGNOSIS — E785 Hyperlipidemia, unspecified: Secondary | ICD-10-CM | POA: Diagnosis not present

## 2019-07-08 DIAGNOSIS — Z7982 Long term (current) use of aspirin: Secondary | ICD-10-CM | POA: Diagnosis not present

## 2019-07-13 DIAGNOSIS — Z7982 Long term (current) use of aspirin: Secondary | ICD-10-CM | POA: Diagnosis not present

## 2019-07-13 DIAGNOSIS — I129 Hypertensive chronic kidney disease with stage 1 through stage 4 chronic kidney disease, or unspecified chronic kidney disease: Secondary | ICD-10-CM | POA: Diagnosis not present

## 2019-07-13 DIAGNOSIS — Z794 Long term (current) use of insulin: Secondary | ICD-10-CM | POA: Diagnosis not present

## 2019-07-13 DIAGNOSIS — E785 Hyperlipidemia, unspecified: Secondary | ICD-10-CM | POA: Diagnosis not present

## 2019-07-13 DIAGNOSIS — I5032 Chronic diastolic (congestive) heart failure: Secondary | ICD-10-CM | POA: Diagnosis not present

## 2019-07-13 DIAGNOSIS — K219 Gastro-esophageal reflux disease without esophagitis: Secondary | ICD-10-CM | POA: Diagnosis not present

## 2019-07-13 DIAGNOSIS — E1122 Type 2 diabetes mellitus with diabetic chronic kidney disease: Secondary | ICD-10-CM | POA: Diagnosis not present

## 2019-07-13 DIAGNOSIS — F329 Major depressive disorder, single episode, unspecified: Secondary | ICD-10-CM | POA: Diagnosis not present

## 2019-07-13 DIAGNOSIS — J849 Interstitial pulmonary disease, unspecified: Secondary | ICD-10-CM | POA: Diagnosis not present

## 2019-07-13 DIAGNOSIS — I480 Paroxysmal atrial fibrillation: Secondary | ICD-10-CM | POA: Diagnosis not present

## 2019-07-13 DIAGNOSIS — N183 Chronic kidney disease, stage 3 unspecified: Secondary | ICD-10-CM | POA: Diagnosis not present

## 2019-07-13 DIAGNOSIS — I509 Heart failure, unspecified: Secondary | ICD-10-CM | POA: Diagnosis not present

## 2019-07-13 DIAGNOSIS — E1165 Type 2 diabetes mellitus with hyperglycemia: Secondary | ICD-10-CM | POA: Diagnosis not present

## 2019-07-13 DIAGNOSIS — I130 Hypertensive heart and chronic kidney disease with heart failure and stage 1 through stage 4 chronic kidney disease, or unspecified chronic kidney disease: Secondary | ICD-10-CM | POA: Diagnosis not present

## 2019-07-13 DIAGNOSIS — R2681 Unsteadiness on feet: Secondary | ICD-10-CM | POA: Diagnosis not present

## 2019-07-13 DIAGNOSIS — R4189 Other symptoms and signs involving cognitive functions and awareness: Secondary | ICD-10-CM | POA: Diagnosis not present

## 2019-07-14 ENCOUNTER — Other Ambulatory Visit: Payer: Self-pay

## 2019-07-14 ENCOUNTER — Non-Acute Institutional Stay: Payer: Medicare Other | Admitting: Adult Health Nurse Practitioner

## 2019-07-14 DIAGNOSIS — Z515 Encounter for palliative care: Secondary | ICD-10-CM

## 2019-07-14 DIAGNOSIS — I5032 Chronic diastolic (congestive) heart failure: Secondary | ICD-10-CM

## 2019-07-14 NOTE — Progress Notes (Signed)
[facility] Palliative Care Consult Note Telephone: [phone]  Fax: [phone]  PATIENT NAME: Justin Leach DOB: [birth year] MRN: [id number]  PRIMARY CARE PROVIDER:  Dr. Durwin Reges [facility]  REFERRING PROVIDER:  Dr. Durwin Reges [facility]  RESPONSIBLE PARTY:   Wife, Ramzy Tsoukalas [id number], [place], [place] and PLAN:  1.  Advanced care planning.  Patient is a DNR.  2.  Infection.  Patient was in hospital 12/12-12/19/2020 for hypoxia related to pneumonia.  Hospitalized again 1/10-1/16/2021 for pneumonia related to COVID infection.  After first hospitalization he was sent to SNF and recently has returned to [place].  Prior to going to SNF he was able to ambulate with walker and assist some with ADLs.  Staff reports that now he is not getting up out of bed much, not walking, and refusing to assist with his care.  Staff states that he doesn't feed himself now and he is not eating as much. Staff reports that he has started helping more with therapy.  Family wants to continue to see how he progresses with therapy.    Palliative will continue to monitor for symptom management/decline and make recommendations as needed.  Will follow up in 2 weeks to see how he is progressing with therapy.  If not progressing well with therapy will talk with family about hospice.  I spent 30 minutes providing this consultation,  from 10:00 to 10:30including time with patient/family, chart review, provider coordination, and documentation . More than 50% of the time in this consultation was spent coordinating communication.   HISTORY OF PRESENT ILLNESS:  VIRLAN MAHNKEN is a 86 y.o. year old male with multiple medical problems including diastolic heart failure, CKD stage 3, type 2 diabetes, hyperkalemia,anemia of chronic disease, hyperlipidemia. Palliative Care was asked to help address goals of care.   CODE STATUS: DNR  PPS:  40% HOSPICE ELIGIBILITY/DIAGNOSIS: TBD  PHYSICAL EXAM:   General: NAD, frail appearing Cardiovascular: regular rate and rhythm Pulmonary: lung sounds clear; normal respiratory effort Abdomen: soft, nontender, + bowel sounds GU: no suprapubic tenderness Extremities: no edema, no joint deformities Neurological: Weakness but otherwise nonfocal;  Patient answering some questions today but not talking much with provider   PAST MEDICAL HISTORY:  Past Medical History:  Diagnosis Date  . Bradycardia    a. during 02/2015 admission - occasional HR in 40s while being treated for atrial fib.  . Cervical disc disease   . Chronic diastolic CHF (congestive heart failure) ([place])    a. Dx 02/2015 -  2D echo 03/02/15 showed severe focal basal hypertrophy of the septum, EF 55-60%, mild MR, no effusion.  . CKD (chronic kidney disease), stage III   . Coronary artery calcification seen on CT scan    a. Nuc 02/2015 was normal.  . Dyslipidemia   . GERD (gastroesophageal reflux disease)   . Hypertension   . 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 PAF (paroxysmal atrial fibrillation) ([place])    a. Dx 02/2015 - in and out on tele, rx'd Coumadin and amiodarone.  . Pneumonia 2007  . Shortness of breath dyspnea    WITH EXERTION  . Type II diabetes mellitus ([place])     SOCIAL HX:  Social History   Tobacco Use  . Smoking status: Never Smoker  . Smokeless tobacco: Former Systems developer    Types: Chew  Substance Use Topics  . Alcohol use: No  ALLERGIES:  Allergies  Allergen Reactions  . 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 Patient subsequently taken off amiodarone.  . Nifedipine Other (See Comments)    Reaction: made gums swell up. Pt states that he had to have gum surgery after taking.      PERTINENT MEDICATIONS:  Outpatient Encounter Medications as of  07/14/2019  Medication Sig  . acetaminophen (TYLENOL) 650 MG CR tablet Take 650 mg by mouth every 6 (six) hours as needed for pain.  Marland Kitchen albuterol (VENTOLIN HFA) 108 (90 Base) MCG/ACT inhaler Inhale 2 puffs into the lungs every 6 (six) hours.  Marland Kitchen amLODipine (NORVASC) 10 MG tablet Take 1 tablet (10 mg total) by mouth daily.  Marland Kitchen aspirin 81 MG chewable tablet Chew 1 tablet (81 mg total) by mouth daily.  Marland Kitchen atorvastatin (LIPITOR) 40 MG tablet Take 40 mg by mouth at bedtime.   . bisacodyl (DULCOLAX) 10 MG suppository Place 1 suppository (10 mg total) rectally daily as needed for moderate constipation.  . citalopram (CELEXA) 20 MG tablet Take 1 tablet (20 mg total) by mouth daily.  Marland Kitchen dexamethasone (DECADRON) 6 MG tablet Take 1 tablet (6 mg total) by mouth daily.  . febuxostat (ULORIC) 40 MG tablet Take 40 mg by mouth daily.  Marland Kitchen guaiFENesin-dextromethorphan (ROBITUSSIN DM) 100-10 MG/5ML syrup Take 10 mLs by mouth every 4 (four) hours as needed for cough.  . hydrALAZINE (APRESOLINE) 100 MG tablet Take 1 tablet (100 mg total) by mouth 3 (three) times daily.  . Infant Care Products (DERMACLOUD) CREA Apply 1 application topically 3 (three) times daily. (apply to perirectal area)  . insulin aspart (NOVOLOG) 100 UNIT/ML injection Inject 8 Units into the skin 4 (four) times daily - after meals and at bedtime. Sliding Scale:  200-249: 2u 250-299: 4u 300-349: 6u 350-399: 8u 400-449: 10u 450-499: 12u 500+ Contact physican  . isosorbide mononitrate (IMDUR) 60 MG 24 hr tablet Take 1 tablet (60 mg total) by mouth daily.  Marland Kitchen loperamide (IMODIUM) 2 MG capsule Take 2-4 mg by mouth See admin instructions. Take 2 capsules (4mg ) by mouth at onset of symptoms and take 1 capsule (2mg ) by mouth every 8 hours if needed - max 4 capsules in 24 hours  . loratadine (CLARITIN) 10 MG tablet Take 10 mg by mouth daily.  . metoprolol tartrate (LOPRESSOR) 50 MG tablet Take 1 tablet (50 mg total) by mouth 2 (two) times daily.  . Multiple  Vitamin (MULTIVITAMIN WITH MINERALS) TABS tablet Take 1 tablet by mouth daily.  . nitroGLYCERIN (NITROSTAT) 0.4 MG SL tablet Place 0.4 mg under the tongue every 5 (five) minutes as needed for chest pain (max 3 doses).   Marland Kitchen omeprazole (PRILOSEC) 20 MG capsule Take 20 mg by mouth daily.   . ondansetron (ZOFRAN) 4 MG tablet Take 4 mg by mouth every 4 (four) hours as needed for nausea or vomiting.  . polyethylene glycol (MIRALAX / GLYCOLAX) packet Take 17 g by mouth daily as needed for mild constipation.   No facility-administered encounter medications on file as of 07/14/2019.     Reathel Turi Jenetta Downer, NP

## 2019-07-15 DIAGNOSIS — Z7982 Long term (current) use of aspirin: Secondary | ICD-10-CM | POA: Diagnosis not present

## 2019-07-15 DIAGNOSIS — F329 Major depressive disorder, single episode, unspecified: Secondary | ICD-10-CM | POA: Diagnosis not present

## 2019-07-15 DIAGNOSIS — N183 Chronic kidney disease, stage 3 unspecified: Secondary | ICD-10-CM | POA: Diagnosis not present

## 2019-07-15 DIAGNOSIS — E1122 Type 2 diabetes mellitus with diabetic chronic kidney disease: Secondary | ICD-10-CM | POA: Diagnosis not present

## 2019-07-15 DIAGNOSIS — E785 Hyperlipidemia, unspecified: Secondary | ICD-10-CM | POA: Diagnosis not present

## 2019-07-15 DIAGNOSIS — I5032 Chronic diastolic (congestive) heart failure: Secondary | ICD-10-CM | POA: Diagnosis not present

## 2019-07-15 DIAGNOSIS — I480 Paroxysmal atrial fibrillation: Secondary | ICD-10-CM | POA: Diagnosis not present

## 2019-07-15 DIAGNOSIS — J849 Interstitial pulmonary disease, unspecified: Secondary | ICD-10-CM | POA: Diagnosis not present

## 2019-07-15 DIAGNOSIS — Z794 Long term (current) use of insulin: Secondary | ICD-10-CM | POA: Diagnosis not present

## 2019-07-15 DIAGNOSIS — I130 Hypertensive heart and chronic kidney disease with heart failure and stage 1 through stage 4 chronic kidney disease, or unspecified chronic kidney disease: Secondary | ICD-10-CM | POA: Diagnosis not present

## 2019-07-15 DIAGNOSIS — E1165 Type 2 diabetes mellitus with hyperglycemia: Secondary | ICD-10-CM | POA: Diagnosis not present

## 2019-07-15 DIAGNOSIS — K219 Gastro-esophageal reflux disease without esophagitis: Secondary | ICD-10-CM | POA: Diagnosis not present

## 2019-07-20 DIAGNOSIS — N183 Chronic kidney disease, stage 3 unspecified: Secondary | ICD-10-CM | POA: Diagnosis not present

## 2019-07-20 DIAGNOSIS — F4489 Other dissociative and conversion disorders: Secondary | ICD-10-CM | POA: Diagnosis not present

## 2019-07-20 DIAGNOSIS — E1129 Type 2 diabetes mellitus with other diabetic kidney complication: Secondary | ICD-10-CM | POA: Diagnosis not present

## 2019-07-20 DIAGNOSIS — I129 Hypertensive chronic kidney disease with stage 1 through stage 4 chronic kidney disease, or unspecified chronic kidney disease: Secondary | ICD-10-CM | POA: Diagnosis not present

## 2019-07-27 DIAGNOSIS — R4189 Other symptoms and signs involving cognitive functions and awareness: Secondary | ICD-10-CM | POA: Diagnosis not present

## 2019-07-27 DIAGNOSIS — R1110 Vomiting, unspecified: Secondary | ICD-10-CM | POA: Diagnosis not present

## 2019-07-27 DIAGNOSIS — I4891 Unspecified atrial fibrillation: Secondary | ICD-10-CM | POA: Diagnosis not present

## 2019-07-27 DIAGNOSIS — R633 Feeding difficulties: Secondary | ICD-10-CM | POA: Diagnosis not present

## 2019-07-28 DIAGNOSIS — F4320 Adjustment disorder, unspecified: Secondary | ICD-10-CM | POA: Diagnosis not present

## 2019-07-29 DIAGNOSIS — E7849 Other hyperlipidemia: Secondary | ICD-10-CM | POA: Diagnosis not present

## 2019-07-29 DIAGNOSIS — J309 Allergic rhinitis, unspecified: Secondary | ICD-10-CM | POA: Diagnosis not present

## 2019-07-29 DIAGNOSIS — J8410 Pulmonary fibrosis, unspecified: Secondary | ICD-10-CM | POA: Diagnosis not present

## 2019-07-29 DIAGNOSIS — R52 Pain, unspecified: Secondary | ICD-10-CM | POA: Diagnosis not present

## 2019-08-19 DEATH — deceased

## 2020-09-15 IMAGING — CT CT HEAD W/O CM
4 series · 16 of 47 positions shown, 18 images · non-contrast
Comparison: CT HEAD May 13, 2018

CLINICAL DATA: Follow up subdural hematomas.

EXAM:
CT HEAD WITHOUT CONTRAST
TECHNIQUE: Contiguous axial images were obtained from the base of the skull
through the vertex without intravenous contrast.

[Series 3: head wo · axial · 0.45mm/px · z∈[-226,-96]mm · 7 of 36 slices shown, 9 images]
[im 5/36  brain]
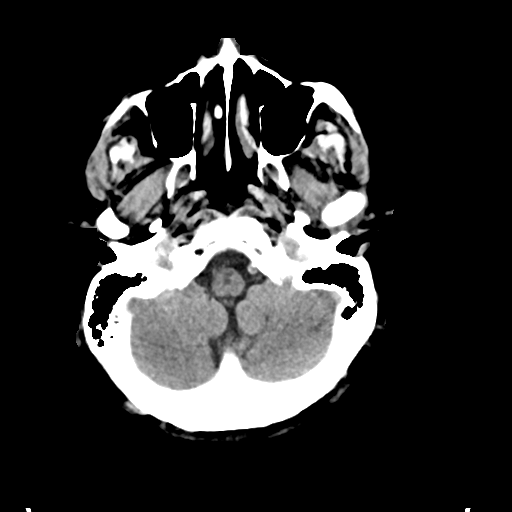
[im 5/36  bone]
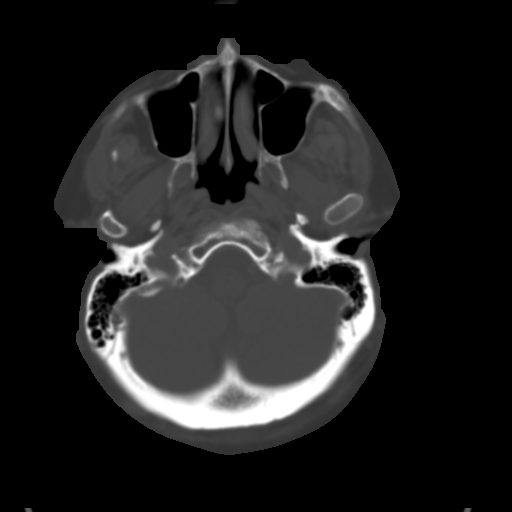
[im 9/36  brain]
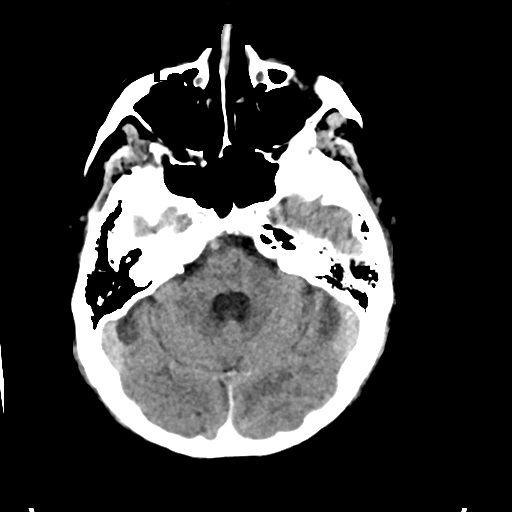
[im 14/36  brain]
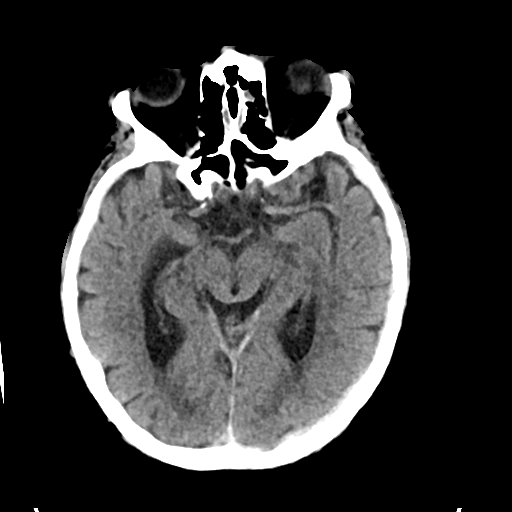
[im 18/36  brain]
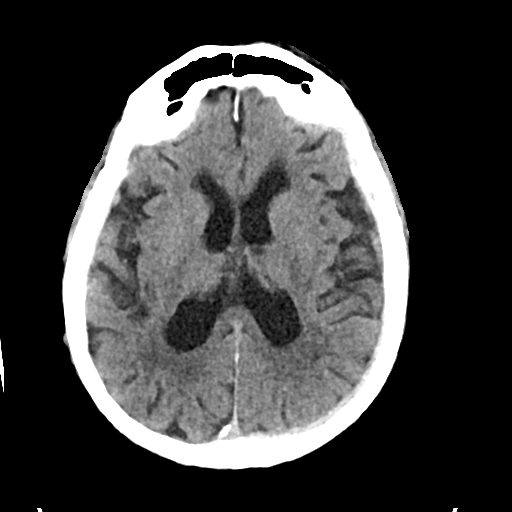
[im 22/36  brain]
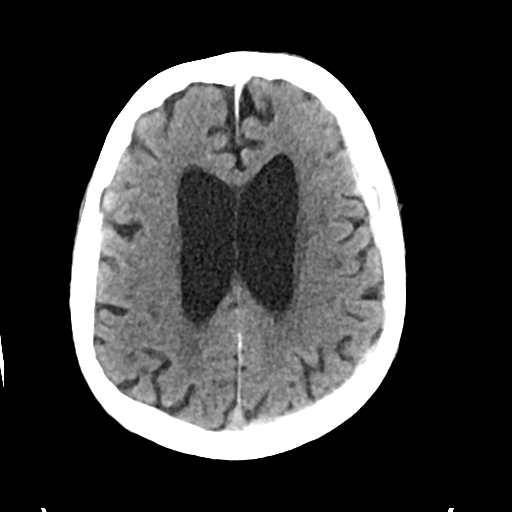
[im 22/36  bone]
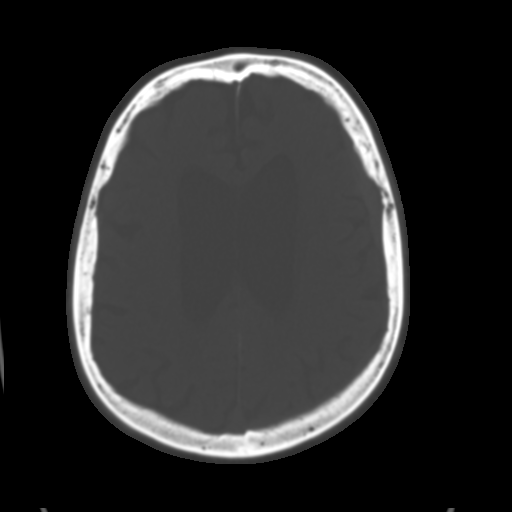
[im 27/36  brain]
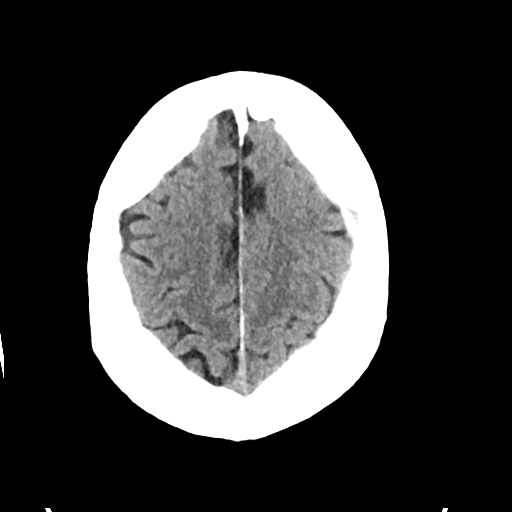
[im 31/36  brain]
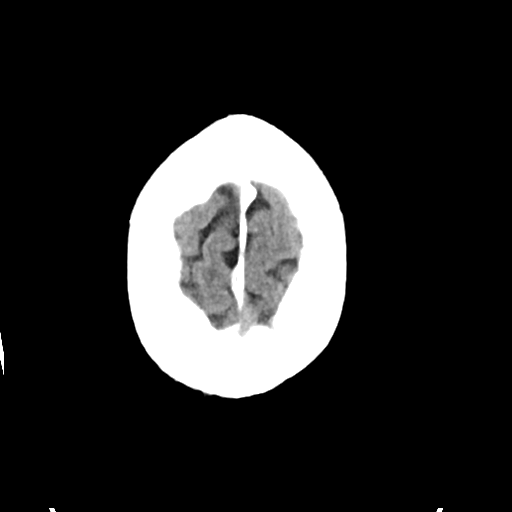

[Series 4: head bone · axial · 0.45mm/px · z∈[-228,-192]mm · 3 of 91 slices shown]
[im 10/91  bone]
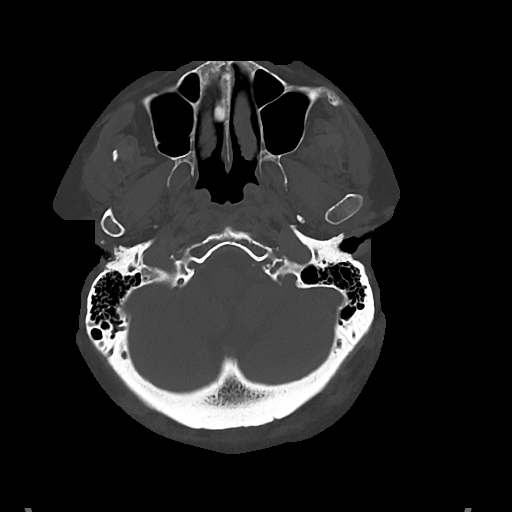
[im 19/91  bone]
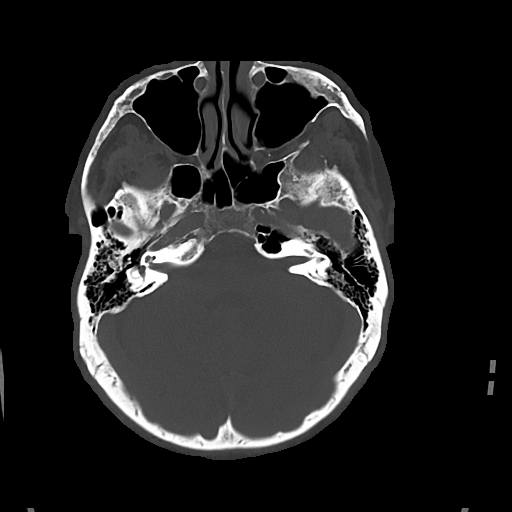
[im 28/91  bone]
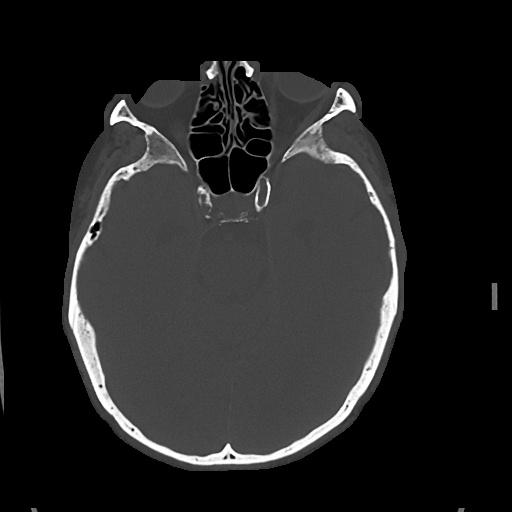

[Series 5: cor soft · coronal · 0.36mm/px · 3 of 73 slices shown]
[im 25/73  brain]
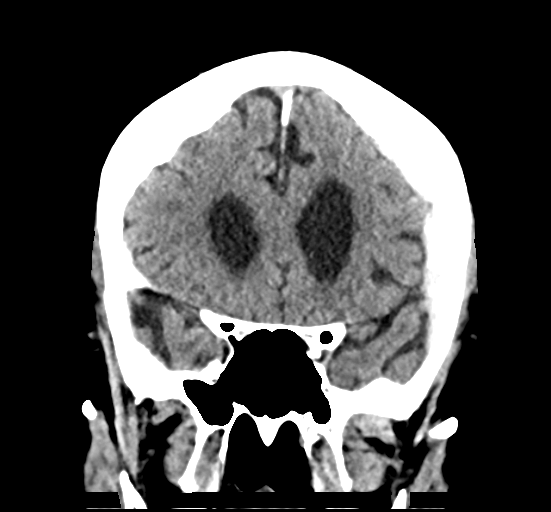
[im 33/73  brain]
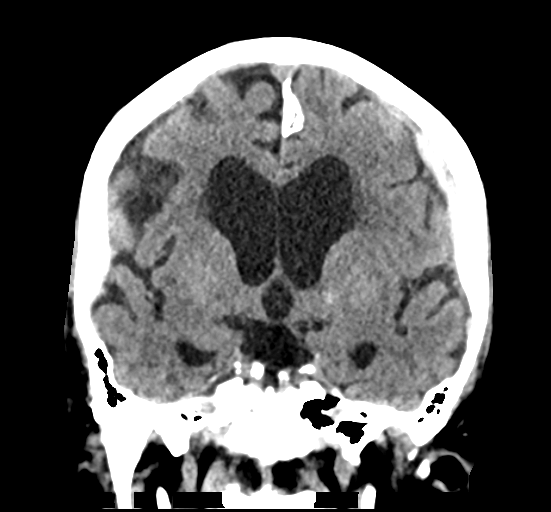
[im 41/73  brain]
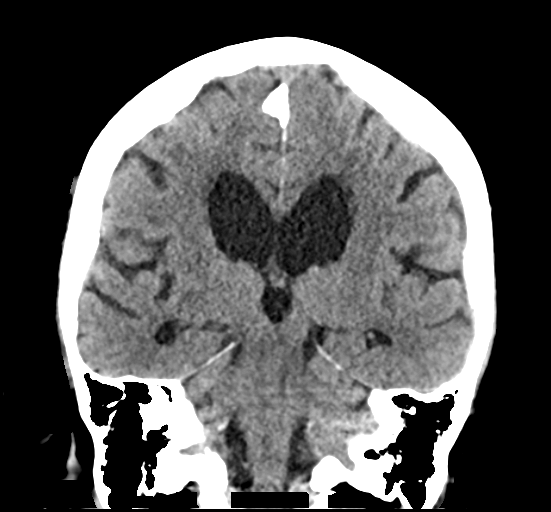

[Series 6: sag soft · sagittal · 0.36mm/px · 3 of 59 slices shown]
[im 20/59  brain]
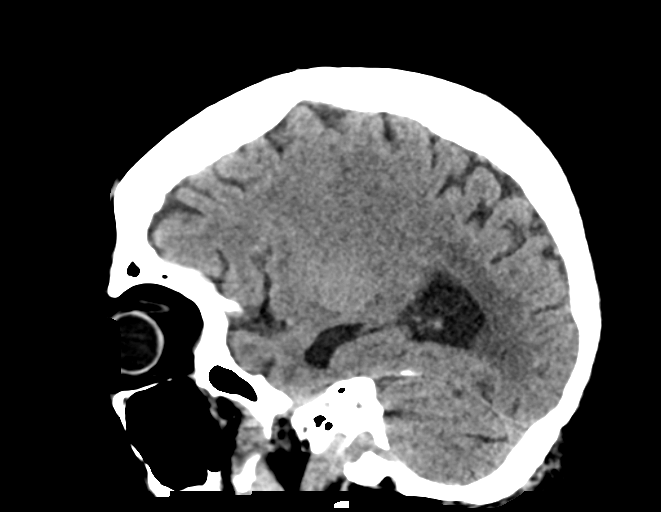
[im 30/59  brain]
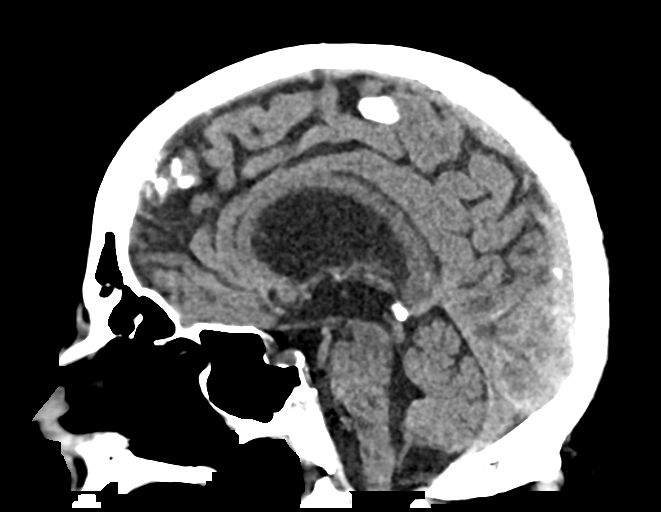
[im 39/59  brain]
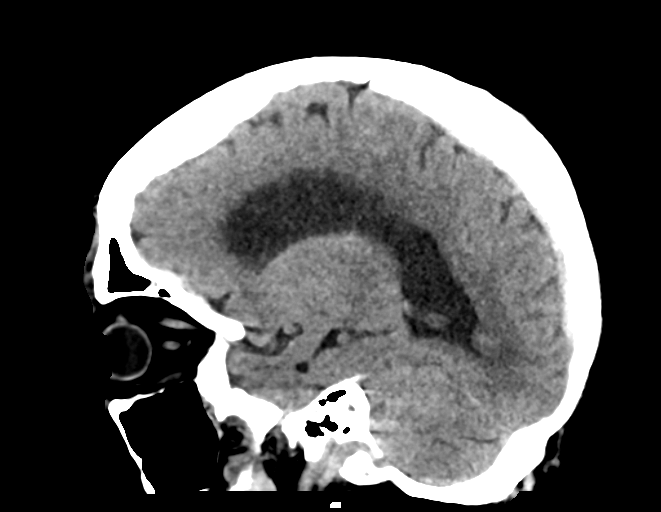

[16 of 47 positions shown; findings below may reference images not displayed]

FINDINGS: BRAIN: No intraparenchymal hemorrhage, mass effect nor midline
shift. Moderate to severe parenchymal brain volume loss, mild sulcal
effacement at the convexities and narrowed callosum angle. Faint
supratentorial white matter hypodensities less than expected for
patient's age, though non-specific are most compatible with chronic
small vessel ischemic disease. No acute large vascular territory
infarcts. Decreased 6 mm LEFT holo hemispheric and 2 mm RIGHT fronto
tentorial dense subdural hematomas. Basal cisterns are patent.

VASCULAR: Moderate calcific atherosclerosis of the carotid siphons.

SKULL: No skull fracture. Focal LEFT frontal scalp soft tissue
swelling, possible contusion. Multifocal scalp scarring.

SINUSES/ORBITS: Trace paranasal sinus mucosal thickening. Mastoid
air cells are well aerated.The included ocular globes and orbital
contents are non-suspicious. Status post bilateral ocular lens
implants.

OTHER: None.
IMPRESSION: 1. Decreased size of bilateral acute subdural hematomas measuring to
6 mm on the LEFT. No midline shift.
2. Image findings of chronic communicating hydrocephalus.
